# Patient Record
Sex: Female | Born: 1935 | Race: White | Hispanic: No | Marital: Married | Smoking: Former smoker
Health system: Southern US, Community
[De-identification: ages and names within clinical notes are randomized; demographics above are authoritative.]

## PROBLEM LIST (undated history)

## (undated) DIAGNOSIS — M169 Osteoarthritis of hip, unspecified: Secondary | ICD-10-CM

## (undated) DIAGNOSIS — J45909 Unspecified asthma, uncomplicated: Secondary | ICD-10-CM

## (undated) DIAGNOSIS — G4700 Insomnia, unspecified: Secondary | ICD-10-CM

## (undated) DIAGNOSIS — E119 Type 2 diabetes mellitus without complications: Secondary | ICD-10-CM

## (undated) DIAGNOSIS — I5032 Chronic diastolic (congestive) heart failure: Secondary | ICD-10-CM

## (undated) DIAGNOSIS — M75101 Unspecified rotator cuff tear or rupture of right shoulder, not specified as traumatic: Secondary | ICD-10-CM

## (undated) DIAGNOSIS — R609 Edema, unspecified: Secondary | ICD-10-CM

## (undated) DIAGNOSIS — R000 Tachycardia, unspecified: Secondary | ICD-10-CM

## (undated) DIAGNOSIS — E7800 Pure hypercholesterolemia, unspecified: Secondary | ICD-10-CM

## (undated) DIAGNOSIS — W19XXXA Unspecified fall, initial encounter: Secondary | ICD-10-CM

## (undated) DIAGNOSIS — J189 Pneumonia, unspecified organism: Secondary | ICD-10-CM

## (undated) DIAGNOSIS — K30 Functional dyspepsia: Secondary | ICD-10-CM

## (undated) DIAGNOSIS — R2681 Unsteadiness on feet: Secondary | ICD-10-CM

## (undated) DIAGNOSIS — K5790 Diverticulosis of intestine, part unspecified, without perforation or abscess without bleeding: Secondary | ICD-10-CM

## (undated) DIAGNOSIS — K56699 Other intestinal obstruction unspecified as to partial versus complete obstruction: Secondary | ICD-10-CM

## (undated) DIAGNOSIS — M8580 Other specified disorders of bone density and structure, unspecified site: Secondary | ICD-10-CM

## (undated) DIAGNOSIS — E039 Hypothyroidism, unspecified: Secondary | ICD-10-CM

## (undated) DIAGNOSIS — K635 Polyp of colon: Secondary | ICD-10-CM

## (undated) DIAGNOSIS — I10 Essential (primary) hypertension: Secondary | ICD-10-CM

## (undated) DIAGNOSIS — J302 Other seasonal allergic rhinitis: Secondary | ICD-10-CM

## (undated) HISTORY — DX: Functional dyspepsia: K30

## (undated) HISTORY — DX: Pure hypercholesterolemia, unspecified: E78.00

## (undated) HISTORY — DX: Unspecified rotator cuff tear or rupture of right shoulder, not specified as traumatic: M75.101

## (undated) HISTORY — DX: Diverticulosis of intestine, part unspecified, without perforation or abscess without bleeding: K57.90

## (undated) HISTORY — DX: Essential (primary) hypertension: I10

## (undated) HISTORY — PX: CATARACT EXTRACTION W/ INTRAOCULAR LENS  IMPLANT, BILATERAL: SHX1307

## (undated) HISTORY — DX: Osteoarthritis of hip, unspecified: M16.9

## (undated) HISTORY — DX: Other specified disorders of bone density and structure, unspecified site: M85.80

## (undated) HISTORY — PX: ORIF WRIST FRACTURE: SHX2133

## (undated) HISTORY — DX: Hypothyroidism, unspecified: E03.9

## (undated) HISTORY — DX: Unsteadiness on feet: R26.81

## (undated) HISTORY — PX: FRACTURE SURGERY: SHX138

## (undated) HISTORY — DX: Chronic diastolic (congestive) heart failure: I50.32

## (undated) HISTORY — PX: DILATION AND CURETTAGE OF UTERUS: SHX78

## (undated) HISTORY — DX: Other intestinal obstruction unspecified as to partial versus complete obstruction: K56.699

## (undated) HISTORY — PX: JOINT REPLACEMENT: SHX530

## (undated) HISTORY — DX: Tachycardia, unspecified: R00.0

## (undated) HISTORY — DX: Insomnia, unspecified: G47.00

## (undated) HISTORY — DX: Edema, unspecified: R60.9

## (undated) HISTORY — DX: Polyp of colon: K63.5

---
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
## 1997-01-11 HISTORY — PX: REDUCTION MAMMAPLASTY: SUR839

## 1997-07-03 ENCOUNTER — Other Ambulatory Visit: Admission: RE | Admit: 1997-07-03 | Discharge: 1997-07-03 | Payer: Self-pay | Admitting: Gynecology

## 1998-08-13 ENCOUNTER — Other Ambulatory Visit: Admission: RE | Admit: 1998-08-13 | Discharge: 1998-08-13 | Payer: Self-pay | Admitting: Gynecology

## 1998-11-11 ENCOUNTER — Encounter: Admission: RE | Admit: 1998-11-11 | Discharge: 1998-11-11 | Payer: Self-pay | Admitting: Gynecology

## 1998-11-11 ENCOUNTER — Encounter: Payer: Self-pay | Admitting: Gynecology

## 1999-10-28 ENCOUNTER — Other Ambulatory Visit: Admission: RE | Admit: 1999-10-28 | Discharge: 1999-10-28 | Payer: Self-pay | Admitting: Gynecology

## 1999-11-10 ENCOUNTER — Encounter (INDEPENDENT_AMBULATORY_CARE_PROVIDER_SITE_OTHER): Payer: Self-pay | Admitting: Specialist

## 1999-11-10 ENCOUNTER — Ambulatory Visit (HOSPITAL_COMMUNITY): Admission: RE | Admit: 1999-11-10 | Discharge: 1999-11-10 | Payer: Self-pay | Admitting: Gastroenterology

## 1999-11-16 ENCOUNTER — Encounter: Payer: Self-pay | Admitting: Gynecology

## 1999-11-16 ENCOUNTER — Encounter: Admission: RE | Admit: 1999-11-16 | Discharge: 1999-11-16 | Payer: Self-pay | Admitting: Gynecology

## 2000-11-15 ENCOUNTER — Encounter: Payer: Self-pay | Admitting: Internal Medicine

## 2000-11-15 ENCOUNTER — Encounter: Admission: RE | Admit: 2000-11-15 | Discharge: 2000-11-15 | Payer: Self-pay | Admitting: Internal Medicine

## 2000-11-21 ENCOUNTER — Other Ambulatory Visit: Admission: RE | Admit: 2000-11-21 | Discharge: 2000-11-21 | Payer: Self-pay | Admitting: Gynecology

## 2001-11-23 ENCOUNTER — Encounter: Payer: Self-pay | Admitting: Gynecology

## 2001-11-23 ENCOUNTER — Encounter: Admission: RE | Admit: 2001-11-23 | Discharge: 2001-11-23 | Payer: Self-pay | Admitting: Gynecology

## 2002-02-22 ENCOUNTER — Other Ambulatory Visit: Admission: RE | Admit: 2002-02-22 | Discharge: 2002-02-22 | Payer: Self-pay | Admitting: Gynecology

## 2002-05-15 ENCOUNTER — Encounter: Admission: RE | Admit: 2002-05-15 | Discharge: 2002-06-28 | Payer: Self-pay | Admitting: Internal Medicine

## 2002-12-12 ENCOUNTER — Encounter: Admission: RE | Admit: 2002-12-12 | Discharge: 2002-12-12 | Payer: Self-pay | Admitting: Gynecology

## 2003-12-16 ENCOUNTER — Encounter: Admission: RE | Admit: 2003-12-16 | Discharge: 2003-12-16 | Payer: Self-pay | Admitting: Gynecology

## 2005-01-12 ENCOUNTER — Encounter: Admission: RE | Admit: 2005-01-12 | Discharge: 2005-01-12 | Payer: Self-pay | Admitting: Internal Medicine

## 2005-03-30 ENCOUNTER — Other Ambulatory Visit: Admission: RE | Admit: 2005-03-30 | Discharge: 2005-03-30 | Payer: Self-pay | Admitting: Gynecology

## 2005-10-29 ENCOUNTER — Encounter: Admission: RE | Admit: 2005-10-29 | Discharge: 2005-10-29 | Payer: Self-pay | Admitting: Orthopedic Surgery

## 2006-01-13 ENCOUNTER — Encounter: Admission: RE | Admit: 2006-01-13 | Discharge: 2006-01-13 | Payer: Self-pay | Admitting: Internal Medicine

## 2006-05-11 ENCOUNTER — Encounter: Admission: RE | Admit: 2006-05-11 | Discharge: 2006-05-11 | Payer: Self-pay | Admitting: Orthopedic Surgery

## 2006-09-06 ENCOUNTER — Encounter: Admission: RE | Admit: 2006-09-06 | Discharge: 2006-09-06 | Payer: Self-pay | Admitting: Orthopedic Surgery

## 2007-01-12 DIAGNOSIS — K56699 Other intestinal obstruction unspecified as to partial versus complete obstruction: Secondary | ICD-10-CM

## 2007-01-12 HISTORY — DX: Other intestinal obstruction unspecified as to partial versus complete obstruction: K56.699

## 2007-01-12 HISTORY — PX: COLONOSCOPY: SHX174

## 2007-01-18 ENCOUNTER — Encounter: Admission: RE | Admit: 2007-01-18 | Discharge: 2007-01-18 | Payer: Self-pay | Admitting: Internal Medicine

## 2007-01-25 ENCOUNTER — Encounter: Admission: RE | Admit: 2007-01-25 | Discharge: 2007-01-25 | Payer: Self-pay | Admitting: Orthopedic Surgery

## 2007-05-08 ENCOUNTER — Encounter: Admission: RE | Admit: 2007-05-08 | Discharge: 2007-05-08 | Payer: Self-pay | Admitting: Orthopedic Surgery

## 2007-08-04 ENCOUNTER — Encounter: Admission: RE | Admit: 2007-08-04 | Discharge: 2007-08-04 | Payer: Self-pay | Admitting: Orthopedic Surgery

## 2007-10-17 ENCOUNTER — Encounter: Admission: RE | Admit: 2007-10-17 | Discharge: 2007-10-17 | Payer: Self-pay | Admitting: Orthopedic Surgery

## 2007-10-30 ENCOUNTER — Encounter: Admission: RE | Admit: 2007-10-30 | Discharge: 2007-10-30 | Payer: Self-pay | Admitting: Gastroenterology

## 2007-10-30 DIAGNOSIS — K5790 Diverticulosis of intestine, part unspecified, without perforation or abscess without bleeding: Secondary | ICD-10-CM

## 2007-10-30 HISTORY — DX: Diverticulosis of intestine, part unspecified, without perforation or abscess without bleeding: K57.90

## 2008-01-30 ENCOUNTER — Encounter: Admission: RE | Admit: 2008-01-30 | Discharge: 2008-01-30 | Payer: Self-pay | Admitting: Orthopedic Surgery

## 2008-03-04 ENCOUNTER — Inpatient Hospital Stay (HOSPITAL_COMMUNITY): Admission: RE | Admit: 2008-03-04 | Discharge: 2008-03-09 | Payer: Self-pay | Admitting: Orthopedic Surgery

## 2008-03-04 HISTORY — PX: TOTAL HIP ARTHROPLASTY: SHX124

## 2008-05-08 ENCOUNTER — Encounter: Admission: RE | Admit: 2008-05-08 | Discharge: 2008-05-08 | Payer: Self-pay | Admitting: Internal Medicine

## 2009-05-12 ENCOUNTER — Encounter: Admission: RE | Admit: 2009-05-12 | Discharge: 2009-05-12 | Payer: Self-pay | Admitting: Internal Medicine

## 2010-02-01 ENCOUNTER — Encounter: Payer: Self-pay | Admitting: Internal Medicine

## 2010-04-21 ENCOUNTER — Other Ambulatory Visit: Payer: Self-pay | Admitting: Internal Medicine

## 2010-04-21 DIAGNOSIS — Z1231 Encounter for screening mammogram for malignant neoplasm of breast: Secondary | ICD-10-CM

## 2010-04-28 LAB — PROTIME-INR
INR: 1 (ref 0.00–1.49)
INR: 1.1 (ref 0.00–1.49)
INR: 1.3 (ref 0.00–1.49)
INR: 2 — ABNORMAL HIGH (ref 0.00–1.49)
INR: 2.1 — ABNORMAL HIGH (ref 0.00–1.49)
INR: 2.4 — ABNORMAL HIGH (ref 0.00–1.49)
Prothrombin Time: 13.8 seconds (ref 11.6–15.2)
Prothrombin Time: 14.5 seconds (ref 11.6–15.2)
Prothrombin Time: 16.6 seconds — ABNORMAL HIGH (ref 11.6–15.2)
Prothrombin Time: 24 seconds — ABNORMAL HIGH (ref 11.6–15.2)
Prothrombin Time: 25.2 seconds — ABNORMAL HIGH (ref 11.6–15.2)
Prothrombin Time: 28.2 seconds — ABNORMAL HIGH (ref 11.6–15.2)

## 2010-04-28 LAB — BASIC METABOLIC PANEL
BUN: 20 mg/dL (ref 6–23)
BUN: 21 mg/dL (ref 6–23)
BUN: 24 mg/dL — ABNORMAL HIGH (ref 6–23)
BUN: 25 mg/dL — ABNORMAL HIGH (ref 6–23)
BUN: 26 mg/dL — ABNORMAL HIGH (ref 6–23)
CO2: 23 mEq/L (ref 19–32)
CO2: 25 mEq/L (ref 19–32)
CO2: 26 mEq/L (ref 19–32)
CO2: 26 mEq/L (ref 19–32)
CO2: 29 mEq/L (ref 19–32)
Calcium: 7.9 mg/dL — ABNORMAL LOW (ref 8.4–10.5)
Calcium: 8 mg/dL — ABNORMAL LOW (ref 8.4–10.5)
Calcium: 8.3 mg/dL — ABNORMAL LOW (ref 8.4–10.5)
Calcium: 8.3 mg/dL — ABNORMAL LOW (ref 8.4–10.5)
Calcium: 8.3 mg/dL — ABNORMAL LOW (ref 8.4–10.5)
Chloride: 102 mEq/L (ref 96–112)
Chloride: 104 mEq/L (ref 96–112)
Chloride: 106 mEq/L (ref 96–112)
Chloride: 107 mEq/L (ref 96–112)
Chloride: 99 mEq/L (ref 96–112)
Creatinine, Ser: 0.91 mg/dL (ref 0.4–1.2)
Creatinine, Ser: 0.96 mg/dL (ref 0.4–1.2)
Creatinine, Ser: 1 mg/dL (ref 0.4–1.2)
Creatinine, Ser: 1.08 mg/dL (ref 0.4–1.2)
Creatinine, Ser: 1.13 mg/dL (ref 0.4–1.2)
GFR calc Af Amer: 56 mL/min — ABNORMAL LOW (ref >60)
GFR calc Af Amer: 59 mL/min — ABNORMAL LOW (ref >60)
GFR calc Af Amer: >60 mL/min (ref >60)
GFR calc Af Amer: >60 mL/min (ref >60)
GFR calc Af Amer: >60 mL/min (ref >60)
GFR calc non Af Amer: 46 mL/min — ABNORMAL LOW (ref >60)
GFR calc non Af Amer: 49 mL/min — ABNORMAL LOW (ref >60)
GFR calc non Af Amer: 53 mL/min — ABNORMAL LOW (ref >60)
GFR calc non Af Amer: 56 mL/min — ABNORMAL LOW (ref >60)
GFR calc non Af Amer: 60 mL/min — ABNORMAL LOW (ref >60)
Glucose, Bld: 119 mg/dL — ABNORMAL HIGH (ref 70–99)
Glucose, Bld: 120 mg/dL — ABNORMAL HIGH (ref 70–99)
Glucose, Bld: 132 mg/dL — ABNORMAL HIGH (ref 70–99)
Glucose, Bld: 157 mg/dL — ABNORMAL HIGH (ref 70–99)
Glucose, Bld: 98 mg/dL (ref 70–99)
Potassium: 3 mEq/L — ABNORMAL LOW (ref 3.5–5.1)
Potassium: 3.7 mEq/L (ref 3.5–5.1)
Potassium: 3.8 mEq/L (ref 3.5–5.1)
Potassium: 4.2 mEq/L (ref 3.5–5.1)
Potassium: 4.3 mEq/L (ref 3.5–5.1)
Sodium: 131 mEq/L — ABNORMAL LOW (ref 135–145)
Sodium: 134 mEq/L — ABNORMAL LOW (ref 135–145)
Sodium: 134 mEq/L — ABNORMAL LOW (ref 135–145)
Sodium: 139 mEq/L (ref 135–145)
Sodium: 140 mEq/L (ref 135–145)

## 2010-04-28 LAB — ABO/RH: ABO/RH(D): O POS

## 2010-04-28 LAB — URINE CULTURE
Colony Count: NO GROWTH
Culture: NO GROWTH
Special Requests: NEGATIVE

## 2010-04-28 LAB — CBC
HCT: 26.4 % — ABNORMAL LOW (ref 36.0–46.0)
HCT: 29 % — ABNORMAL LOW (ref 36.0–46.0)
HCT: 29.8 % — ABNORMAL LOW (ref 36.0–46.0)
HCT: 31.4 % — ABNORMAL LOW (ref 36.0–46.0)
HCT: 41 % (ref 36.0–46.0)
Hemoglobin: 10.5 g/dL — ABNORMAL LOW (ref 12.0–15.0)
Hemoglobin: 13.5 g/dL (ref 12.0–15.0)
Hemoglobin: 8.9 g/dL — ABNORMAL LOW (ref 12.0–15.0)
Hemoglobin: 9.9 g/dL — ABNORMAL LOW (ref 12.0–15.0)
Hemoglobin: 9.9 g/dL — ABNORMAL LOW (ref 12.0–15.0)
MCHC: 33 g/dL (ref 30.0–36.0)
MCHC: 33.3 g/dL (ref 30.0–36.0)
MCHC: 33.4 g/dL (ref 30.0–36.0)
MCHC: 33.6 g/dL (ref 30.0–36.0)
MCHC: 34.2 g/dL (ref 30.0–36.0)
MCV: 92.8 fL (ref 78.0–100.0)
MCV: 92.8 fL (ref 78.0–100.0)
MCV: 93.1 fL (ref 78.0–100.0)
MCV: 93.8 fL (ref 78.0–100.0)
MCV: 93.9 fL (ref 78.0–100.0)
Platelets: 156 10*3/uL (ref 150–400)
Platelets: 161 10*3/uL (ref 150–400)
Platelets: 165 10*3/uL (ref 150–400)
Platelets: 175 10*3/uL (ref 150–400)
Platelets: 219 10*3/uL (ref 150–400)
RBC: 2.84 MIL/uL — ABNORMAL LOW (ref 3.87–5.11)
RBC: 3.12 MIL/uL — ABNORMAL LOW (ref 3.87–5.11)
RBC: 3.17 MIL/uL — ABNORMAL LOW (ref 3.87–5.11)
RBC: 3.38 MIL/uL — ABNORMAL LOW (ref 3.87–5.11)
RBC: 4.37 MIL/uL (ref 3.87–5.11)
RDW: 13.7 % (ref 11.5–15.5)
RDW: 13.7 % (ref 11.5–15.5)
RDW: 13.9 % (ref 11.5–15.5)
RDW: 14 % (ref 11.5–15.5)
RDW: 14.4 % (ref 11.5–15.5)
WBC: 11.1 10*3/uL — ABNORMAL HIGH (ref 4.0–10.5)
WBC: 11.6 10*3/uL — ABNORMAL HIGH (ref 4.0–10.5)
WBC: 6.6 10*3/uL (ref 4.0–10.5)
WBC: 8.6 10*3/uL (ref 4.0–10.5)
WBC: 9.4 10*3/uL (ref 4.0–10.5)

## 2010-04-28 LAB — URINE MICROSCOPIC-ADD ON

## 2010-04-28 LAB — URINALYSIS, ROUTINE W REFLEX MICROSCOPIC
Bilirubin Urine: NEGATIVE
Bilirubin Urine: NEGATIVE
Glucose, UA: NEGATIVE mg/dL
Glucose, UA: NEGATIVE mg/dL
Hgb urine dipstick: NEGATIVE
Hgb urine dipstick: NEGATIVE
Ketones, ur: NEGATIVE mg/dL
Ketones, ur: NEGATIVE mg/dL
Nitrite: NEGATIVE
Nitrite: POSITIVE — AB
Protein, ur: NEGATIVE mg/dL
Protein, ur: NEGATIVE mg/dL
Specific Gravity, Urine: 1.021 (ref 1.005–1.030)
Specific Gravity, Urine: 1.024 (ref 1.005–1.030)
Urobilinogen, UA: 0.2 mg/dL (ref 0.0–1.0)
Urobilinogen, UA: 1 mg/dL (ref 0.0–1.0)
pH: 5.5 (ref 5.0–8.0)
pH: 6 (ref 5.0–8.0)

## 2010-04-28 LAB — COMPREHENSIVE METABOLIC PANEL
ALT: 17 U/L (ref 0–35)
AST: 25 U/L (ref 0–37)
Albumin: 4 g/dL (ref 3.5–5.2)
Alkaline Phosphatase: 68 U/L (ref 39–117)
BUN: 31 mg/dL — ABNORMAL HIGH (ref 6–23)
CO2: 27 mEq/L (ref 19–32)
Calcium: 9.8 mg/dL (ref 8.4–10.5)
Chloride: 111 mEq/L (ref 96–112)
Creatinine, Ser: 1.27 mg/dL — ABNORMAL HIGH (ref 0.4–1.2)
GFR calc Af Amer: 49 mL/min — ABNORMAL LOW (ref >60)
GFR calc non Af Amer: 41 mL/min — ABNORMAL LOW (ref >60)
Glucose, Bld: 136 mg/dL — ABNORMAL HIGH (ref 70–99)
Potassium: 4.7 mEq/L (ref 3.5–5.1)
Sodium: 147 mEq/L — ABNORMAL HIGH (ref 135–145)
Total Bilirubin: 0.9 mg/dL (ref 0.3–1.2)
Total Protein: 6.7 g/dL (ref 6.0–8.3)

## 2010-04-28 LAB — TYPE AND SCREEN
ABO/RH(D): O POS
Antibody Screen: NEGATIVE

## 2010-04-28 LAB — APTT: aPTT: 29 seconds (ref 24–37)

## 2010-05-14 ENCOUNTER — Ambulatory Visit: Payer: Self-pay

## 2010-05-20 ENCOUNTER — Ambulatory Visit: Payer: Self-pay

## 2010-05-25 ENCOUNTER — Ambulatory Visit
Admission: RE | Admit: 2010-05-25 | Discharge: 2010-05-25 | Disposition: A | Discharge status: Home / Self Care | Payer: Medicare Other | Source: Ambulatory Visit | Attending: Internal Medicine | Admitting: Internal Medicine

## 2010-05-25 DIAGNOSIS — Z1231 Encounter for screening mammogram for malignant neoplasm of breast: Secondary | ICD-10-CM

## 2010-05-26 NOTE — Op Note (Signed)
[name], Salinas             ACCOUNT NO.:  [id number]   MEDICAL RECORD NO.:  [id number]          PATIENT TYPE:  INP   LOCATION:  [place]                         FACILITY:  [facility]   PHYSICIAN:  Gaynelle Arabian, M.D.    DATE OF BIRTH:  September 24, 1926   DATE OF PROCEDURE:  03/04/2008  DATE OF DISCHARGE:                               OPERATIVE REPORT   PREOPERATIVE DIAGNOSIS:  Osteoarthritis, right hip.   POSTOPERATIVE DIAGNOSIS:  Osteoarthritis, right hip.   PROCEDURE:  Right total hip arthroplasty.   SURGEON:  Gaynelle Arabian, M.D.   ASSISTANT:  Arlee Muslim PA-C.   ANESTHESIA:  General.   ESTIMATED BLOOD LOSS:  300.   DRAIN:  Hemovac x1.   COMPLICATIONS:  None.   CONDITION:  Stable to recovery.   BRIEF CLINICAL NOTE:  Kelsey Salinas is a 77 year old female with end-  stage arthritis of the right hip with progressively worsening pain and  dysfunction.  She has failed nonoperative management and presents now  for right total hip arthroplasty.   PROCEDURE IN DETAIL:  After the successful administration of general  anesthetic, the patient is placed in the left lateral decubitus position  with the right side up and held with the hip positioner.  The right  lower extremity is isolated from her perineum with plastic drapes and  prepped and draped in the usual sterile fashion.  A standard  posterolateral incision is made with a 10 blade through subcutaneous  tissue to the level of fascia lata which is incised in line with the  skin incision.  Sciatic nerve was palpated and protected and the short  external rotator is isolated off the femur.  Capsulectomy is performed  and the hip is dislocated.  The center of the femoral head is marked and  a trial prosthesis is placed, such that the center of the trial head  corresponds to the center of native femoral head.  The osteotomy line is  marked on the femoral neck and osteotomy made with an oscillating saw.  The femoral head is removed and  the femur is retracted anteriorly to  gain acetabular exposure.   Acetabular retractors are placed in the labrum and osteophytes removed.  Acetabular reaming starts at 47-mm, coursing increments of 2-mm up to 55-  mm and then a 56-mm pinnacle acetabular shell is placed in anatomic  position with excellent purchase.  No additional dome screws are needed.  The trial 32-mm neutral +4 liner is placed.   The femur is prepared with the canal finder and irrigation.  Axial  reaming is performed up to 13.5-mm, proximal reaming to an 59F and the  sleeve machined to a large.  The 59F large trial sleeve is placed with  an 18 x 13 stem and a 36+8 neck, matching native anteversion.  The 36+0  head is placed.  This reduces too easily so we went to a 36+6 which had  more appropriate soft tissue tension.  There is outstanding stability  with full extension, full external rotation, 70 degrees flexion, 40  degrees adduction and 90 degrees internal rotation and 90 degrees of  flexion and 70 degrees of internal rotation.  By placing the right leg  on top of the left it felt as though the leg lengths are now equal.  The  hip is then dislocated and trials removed.  The permanent 30F large  sleeve is then placed with the 18 x 13 stem and a 36+8 neck, matching  native anteversion.  A 36+6 head is placed and the hip is reduced with  same stability parameters.  The wound is copiously irrigated with saline  solution and the short rotators reattached to the femur through drill  holes.  Fascia lata is closed over a Hemovac drain with interrupted #1  Vicryl, subcu closed with #1-0 and #2-0 Vicryl and subcuticular running  4-0 Monocryl.  The incisions cleaned and dried and Steri-Strips and  bulky sterile dressing applied.  Drains hooked to suction and knee is  placed into a knee immobilizer, awakened, transported to recovery in  stable condition.      Gaynelle Arabian, M.D.  Electronically Signed     FA/MEDQ  D:   03/04/2008  T:  03/04/2008  Job:  [id number]

## 2010-05-26 NOTE — H&P (Signed)
Kelsey Salinas, Kelsey Salinas             ACCOUNT NO.:  [id number]   MEDICAL RECORD NO.:  [id number]          PATIENT TYPE:  INP   LOCATION:  NA                           FACILITY:  [facility]   PHYSICIAN:  Gaynelle Arabian, M.D.    DATE OF BIRTH:  06/16/1926   DATE OF ADMISSION:  03/02/2008  DATE OF DISCHARGE:                              HISTORY & PHYSICAL   CHIEF COMPLAINT:  Right hip pain.   HISTORY OF PRESENT ILLNESS:  The patient is 76 year old female who has  been seen by Dr. Wynelle Link in second opinion for ongoing hip pain.  It has  been ongoing for over 4+ years now, no specific injury, decreasing range  of motion with increasing pain.  She has been seen in the office, found  to have end-stage arthritis with bone-on-bone deformity with also  deformity of the femoral head due to arthritis subchondral cystic  changes are also noted.  It is at a point now she is end-stage and felt  to be a good candidate.  She has been seen preoperatively by Dr. Maxwell Caul  and felt she should be average cardiovascular risk for a patient of her  age.  No contraindications to planned surgery.   ALLERGIES:  NO KNOWN DRUG ALLERGIES.   CURRENT MEDICATIONS:  Actonel, amlodipine, Diovan, simvastatin,  metoprolol, a [place] brand stomach acid reducer and also vitamin D.   PAST MEDICAL HISTORY:  1. Hypertension.  2. Hypercholesterolemia.  3. Osteopenia.   PAST SURGICAL HISTORY:  Breast reduction.   FAMILY HISTORY:  Father with heart disease.  Mother with heart disease.  Brother with heart disease.   SOCIAL HISTORY:  Married, past smoker.  No alcohol.  Three children.  She does have a caregiver lined up after surgery.   REVIEW OF SYSTEMS:  GENERAL:  No fevers, chills or night sweats.  NEUROLOGICAL:  No seizures, syncope or paralysis.  RESPIRATORY:  Little  bit of shortness on exertion, sometimes, no shortness of breath at rest.  No productive cough or hemoptysis.  CARDIOVASCULAR:  Chest pain, angina,  orthopnea.  GI:  No nausea, vomiting, diarrhea or constipation.  GU:  No  dysuria or hematuria or discharge.  MUSCULOSKELETAL:  Hip pain.   PHYSICAL EXAMINATION:  VITAL SIGNS:  Pulse 60, respirations 14, blood  pressure 136/72.  GENERAL:  A 77 year old white female well-nourished, well-developed, no  acute distress.  She is alert, oriented and cooperative.  HEENT:  Normocephalic, atraumatic.  Pupils are round and reactive.  EOMs  intact.  Never wear glasses.  NECK:  Supple.  CHEST:  Clear.  HEART:  Regular rate and rhythm.  No murmur, S1 ST noted.  ABDOMEN:  Soft, nontender.  Bowel sounds present.  RECTAL/BREASTS/GENITALIA:  Not done, not pertinent to present illness.  EXTREMITIES:  Right hip, flexion 90, 0 internal rotation, about 10 deg  external rotation, about 20 degrees abduction.   IMPRESSION:  Osteoarthritis right hip.   PLAN:  The patient admitted to [facility] to undergo a right  total hip replacement arthroplasty.  Surgery will be performed by Dr.  Gaynelle Arabian.  Alexzandrew L. Perkins, P.A.C.      Gaynelle Arabian, M.D.  Electronically Signed    ALP/MEDQ  D:  03/03/2008  T:  03/04/2008  Job:  [id number]   cc:   Nehemiah Settle, M.D.  Fax: [phone]   Gaynelle Arabian, M.D.  Fax: [phone]

## 2010-05-29 NOTE — Discharge Summary (Signed)
Kelsey Salinas, Kelsey Salinas             ACCOUNT NO.:  [id number]   MEDICAL RECORD NO.:  [id number]          PATIENT TYPE:  INP   LOCATION:  [place]                         FACILITY:  [facility]   PHYSICIAN:  Gaynelle Arabian, M.D.    DATE OF BIRTH:  04-13-1926   DATE OF ADMISSION:  03/04/2008  DATE OF DISCHARGE:  03/09/2008                               DISCHARGE SUMMARY   ADMITTING DIAGNOSES:  1. Osteoarthritis right hip.  2. Hypertension.  3. Hypercholesterolemia.  4. Osteopenia.   DISCHARGE DIAGNOSES:  1. Osteoarthritis right hip, status post right total hip replacement      arthroplasty.  2. Mild postoperative acute blood loss anemia.  3. Postoperative hyponatremia, improved.  4. Postoperative hypokalemia, improved.  5. Postoperative sedation, resolved.  6. Postoperative hypoxia, resolved.  7. Hypertension.  8. Hypercholesterolemia.  9. Osteopenia.   PROCEDURE:  March 04, 2008 - right total hip arthroplasty.  Surgeon -  Dr. Wynelle Link.  Assistant - Arlee Muslim PA-C.   CONSULTATIONS:  None.   BRIEF HISTORY:  Kelsey Salinas is a 77 year old female with end-stage  arthritis of the right hip, progressive worsening pain and dysfunction  who failed operative management and now presents for a total hip  arthroplasty.   LABORATORY DATA:  CBC on admission - hemoglobin 13.5, hematocrit 41.0,  white cell count 6.6, platelets 219.  Postoperative hemoglobin 10.5,  then dropped down to 9.9.  Last noted hemoglobin and hematocrit were 8.9  and 26.4.  PT/PTT preoperatively 13.8 and 29 respectively.  INR 1.0.  Serial pro times followed per Coumadin protocol.  Last noted PT/INR 25.2  and 2.1.  Chem panel on admission showed some elevated sodium of 147,  glucose elevated at 136, elevated BUN and creatinine at 31 and 1.2.  The  remaining Chem panel within normal limits.  Serial BMETs were followed.  BMET came down normal to 21, creatinine came down immediately to 1.0,  and last at  0.9.  Sodium came  down to 134, got as low as 131, back up  to 140.  Potassium dropped from 4.7 to 3.0, back up to 4.3.  Preoperative UA with positive nitrite, moderate leukocyte esterase, 3-6  white cells, many bacteria.  Follow-up UA only showed small leukocyte  esterase, few WBCs, 7-10 white cells; otherwise negative.  Urine culture  was negative.  Right hip film on February 28, 2008 revealed advanced  degenerative changes of the right hip; question underlying AVN.  Mild  degenerative changes of the left hip, SI joint and lumbosacral junction.  No acute findings.  Pelvis and hip films on March 04, 2008 - status  post right total hip replacement appears to be in satisfactory position.   EKG, July 27, 2007, normal sinus rhythm, no changes.   HOSPITAL COURSE:  The patient was admitted to [facility],  taken to the OR and underwent the above-stated procedure without  complication.  The patient tolerated the procedure well and was later  taken to the recovery room and orthopedic floor.  Started on PCA and  p.o. analgesic pain control following surgery.  Had a long night.  The  family was in the room.  Had difficulty with pain.  BUN and creatinine  started out elevated preoperatively.  It may have just been due to some  dehydration preoperatively but started BUN preoperative 31 down to 25 on  day one and preoperative creatinine 1.2 down to 1.0, so it is already  improved.  She had low urinary output though, so we supplemented the  preoperative azotemia with fluids, low output also with fluids.  Discontinued the PCA.  Blood pressure was stable.  Vitals were stable.  Hemovac drain placed during the surgery was pulled.  Blood pressure  looked good but on the lower side, so we held her blood pressure  medications with certain parameters, discontinued the knee immobilizer,  started getting up with therapy.  Unfortunately, on the evening of day  one, she started getting lethargic from medications.   Pulse oximetry  started dropping a little bit.  We placed her back on O2, saturations  came back up in the 90s, it got low in the mid 80s, and when she would  get lethargic, she had repeating drop but was able to cough and deep  breathe, stimulating back up in the mid 90s.  She had to go on to an O2  mask and stayed on the oxygen.  She was doing a little bit better by day  two.  We reduced the narcotics, and let the medications work themselves  out.  Hemoglobin was stable.  Sodium was down a little bit, down to 134  though.  Her output had improved greatly, started getting up more.  We  repeated the UA because of the preoperative urinalysis.  By day three,  the urine culture was negative.  Sodium was down, potassium was down  because of all the supplemental fluids that we had used, so once her  output was increased, we decreased fluids and gave her a little mild  diuretic to help with the dilutional component and also put her on some  K-Dur.  Chest x-ray was checked.  The chest x-ray done on March 07, 2008 showed mild basilar atelectasis but no evidence of pneumonia or  effusion.  Encouraged incentive spirometer and pulmonary toilet.  By day  four, she still needed a little O2 through the night, since her O2s,  were dropping.  If they continued to drop during the day, we checked the  scan, but those actually did improve.  Hemoglobin was down a little bit  to 8.9, but she was asymptomatic with this.  Incisional only had some  serous drainage from the Hemovac site; otherwise the incision was  healing well.  The Lasix helped, and her sodium, concentrated back up to  140 and potassium was back up with the K-Dur to 4.3.  We discontinued  the nasal cannula O2, and luckily she stayed up and was doing well by  the next day, March 09, 2008.  The patient was tolerating her  medications and discharged home.   DISCHARGE/PLAN:  1. The patient discharged home on March 09, 2008.  2.  Discharge diagnoses - please see above.  3. Discharge medications - Darvocet, Robaxin, Coumadin.  4. Diet - low-sodium, heart-healthy, low-cholesterol diet.   ACTIVITY:  Partial weightbearing, 25-50% right leg, hip precautions,  total hip protocol.   FOLLOWUP:  Follow up in 2 weeks.   DISPOSITION:  Home.   CONDITION UPON DISCHARGE:  Improving.      Alexzandrew L. Perkins, P.A.C.      Pilar Plate Aluisio,  M.D.  Electronically Signed    ALP/MEDQ  D:  04/04/2008  T:  04/04/2008  Job:  [id number]   cc:   Nehemiah Settle, M.D.  Fax: [phone]

## 2010-10-20 ENCOUNTER — Other Ambulatory Visit: Payer: Self-pay | Admitting: Dermatology

## 2011-01-20 DIAGNOSIS — L03039 Cellulitis of unspecified toe: Secondary | ICD-10-CM | POA: Diagnosis not present

## 2011-01-20 DIAGNOSIS — L600 Ingrowing nail: Secondary | ICD-10-CM | POA: Diagnosis not present

## 2011-01-26 DIAGNOSIS — M1710 Unilateral primary osteoarthritis, unspecified knee: Secondary | ICD-10-CM | POA: Diagnosis not present

## 2011-01-26 DIAGNOSIS — IMO0002 Reserved for concepts with insufficient information to code with codable children: Secondary | ICD-10-CM | POA: Diagnosis not present

## 2011-01-28 DIAGNOSIS — F4322 Adjustment disorder with anxiety: Secondary | ICD-10-CM | POA: Diagnosis not present

## 2011-02-01 DIAGNOSIS — IMO0002 Reserved for concepts with insufficient information to code with codable children: Secondary | ICD-10-CM | POA: Diagnosis not present

## 2011-02-01 DIAGNOSIS — M1710 Unilateral primary osteoarthritis, unspecified knee: Secondary | ICD-10-CM | POA: Diagnosis not present

## 2011-02-10 DIAGNOSIS — M1710 Unilateral primary osteoarthritis, unspecified knee: Secondary | ICD-10-CM | POA: Diagnosis not present

## 2011-02-10 DIAGNOSIS — IMO0002 Reserved for concepts with insufficient information to code with codable children: Secondary | ICD-10-CM | POA: Diagnosis not present

## 2011-02-17 DIAGNOSIS — F4322 Adjustment disorder with anxiety: Secondary | ICD-10-CM | POA: Diagnosis not present

## 2011-03-18 DIAGNOSIS — L821 Other seborrheic keratosis: Secondary | ICD-10-CM | POA: Diagnosis not present

## 2011-03-18 DIAGNOSIS — L723 Sebaceous cyst: Secondary | ICD-10-CM | POA: Diagnosis not present

## 2011-03-25 DIAGNOSIS — M1710 Unilateral primary osteoarthritis, unspecified knee: Secondary | ICD-10-CM | POA: Diagnosis not present

## 2011-03-25 DIAGNOSIS — IMO0002 Reserved for concepts with insufficient information to code with codable children: Secondary | ICD-10-CM | POA: Diagnosis not present

## 2011-04-29 DIAGNOSIS — F4322 Adjustment disorder with anxiety: Secondary | ICD-10-CM | POA: Diagnosis not present

## 2011-05-18 DIAGNOSIS — F4322 Adjustment disorder with anxiety: Secondary | ICD-10-CM | POA: Diagnosis not present

## 2011-06-10 DIAGNOSIS — F4322 Adjustment disorder with anxiety: Secondary | ICD-10-CM | POA: Diagnosis not present

## 2011-06-18 DIAGNOSIS — L820 Inflamed seborrheic keratosis: Secondary | ICD-10-CM | POA: Diagnosis not present

## 2011-06-18 DIAGNOSIS — L570 Actinic keratosis: Secondary | ICD-10-CM | POA: Diagnosis not present

## 2011-06-18 DIAGNOSIS — L578 Other skin changes due to chronic exposure to nonionizing radiation: Secondary | ICD-10-CM | POA: Diagnosis not present

## 2011-06-29 DIAGNOSIS — M899 Disorder of bone, unspecified: Secondary | ICD-10-CM | POA: Diagnosis not present

## 2011-06-29 DIAGNOSIS — R05 Cough: Secondary | ICD-10-CM | POA: Diagnosis not present

## 2011-06-29 DIAGNOSIS — R059 Cough, unspecified: Secondary | ICD-10-CM | POA: Diagnosis not present

## 2011-06-29 DIAGNOSIS — E7800 Pure hypercholesterolemia, unspecified: Secondary | ICD-10-CM | POA: Diagnosis not present

## 2011-06-29 DIAGNOSIS — M949 Disorder of cartilage, unspecified: Secondary | ICD-10-CM | POA: Diagnosis not present

## 2011-06-29 DIAGNOSIS — F411 Generalized anxiety disorder: Secondary | ICD-10-CM | POA: Diagnosis not present

## 2011-06-29 DIAGNOSIS — I10 Essential (primary) hypertension: Secondary | ICD-10-CM | POA: Diagnosis not present

## 2011-07-13 ENCOUNTER — Ambulatory Visit: Payer: Medicare Other | Admitting: Sports Medicine

## 2011-07-22 DIAGNOSIS — M1710 Unilateral primary osteoarthritis, unspecified knee: Secondary | ICD-10-CM | POA: Diagnosis not present

## 2011-07-22 DIAGNOSIS — IMO0002 Reserved for concepts with insufficient information to code with codable children: Secondary | ICD-10-CM | POA: Diagnosis not present

## 2011-09-15 DIAGNOSIS — F4322 Adjustment disorder with anxiety: Secondary | ICD-10-CM | POA: Diagnosis not present

## 2011-09-23 ENCOUNTER — Other Ambulatory Visit: Payer: Self-pay | Admitting: Internal Medicine

## 2011-09-23 DIAGNOSIS — Z1231 Encounter for screening mammogram for malignant neoplasm of breast: Secondary | ICD-10-CM

## 2011-09-23 DIAGNOSIS — Z9889 Other specified postprocedural states: Secondary | ICD-10-CM

## 2011-10-13 ENCOUNTER — Ambulatory Visit
Admission: RE | Admit: 2011-10-13 | Discharge: 2011-10-13 | Disposition: A | Discharge status: Home / Self Care | Payer: Medicare Other | Source: Ambulatory Visit | Attending: Internal Medicine | Admitting: Internal Medicine

## 2011-10-13 DIAGNOSIS — Z1231 Encounter for screening mammogram for malignant neoplasm of breast: Secondary | ICD-10-CM

## 2011-10-13 DIAGNOSIS — Z9889 Other specified postprocedural states: Secondary | ICD-10-CM

## 2011-10-26 DIAGNOSIS — R0609 Other forms of dyspnea: Secondary | ICD-10-CM | POA: Diagnosis not present

## 2011-10-26 DIAGNOSIS — Z23 Encounter for immunization: Secondary | ICD-10-CM | POA: Diagnosis not present

## 2011-10-26 DIAGNOSIS — R0989 Other specified symptoms and signs involving the circulatory and respiratory systems: Secondary | ICD-10-CM | POA: Diagnosis not present

## 2011-10-26 DIAGNOSIS — I10 Essential (primary) hypertension: Secondary | ICD-10-CM | POA: Diagnosis not present

## 2011-11-03 DIAGNOSIS — E7800 Pure hypercholesterolemia, unspecified: Secondary | ICD-10-CM | POA: Diagnosis not present

## 2011-11-03 DIAGNOSIS — R0989 Other specified symptoms and signs involving the circulatory and respiratory systems: Secondary | ICD-10-CM | POA: Diagnosis not present

## 2011-11-03 DIAGNOSIS — R0609 Other forms of dyspnea: Secondary | ICD-10-CM | POA: Diagnosis not present

## 2011-11-03 DIAGNOSIS — I10 Essential (primary) hypertension: Secondary | ICD-10-CM | POA: Diagnosis not present

## 2011-11-15 DIAGNOSIS — L820 Inflamed seborrheic keratosis: Secondary | ICD-10-CM | POA: Diagnosis not present

## 2011-11-15 DIAGNOSIS — L578 Other skin changes due to chronic exposure to nonionizing radiation: Secondary | ICD-10-CM | POA: Diagnosis not present

## 2011-11-15 DIAGNOSIS — L723 Sebaceous cyst: Secondary | ICD-10-CM | POA: Diagnosis not present

## 2011-11-18 DIAGNOSIS — IMO0002 Reserved for concepts with insufficient information to code with codable children: Secondary | ICD-10-CM | POA: Diagnosis not present

## 2011-11-18 DIAGNOSIS — M1710 Unilateral primary osteoarthritis, unspecified knee: Secondary | ICD-10-CM | POA: Diagnosis not present

## 2011-12-08 DIAGNOSIS — R000 Tachycardia, unspecified: Secondary | ICD-10-CM | POA: Diagnosis not present

## 2011-12-08 DIAGNOSIS — R0609 Other forms of dyspnea: Secondary | ICD-10-CM | POA: Diagnosis not present

## 2011-12-08 DIAGNOSIS — I10 Essential (primary) hypertension: Secondary | ICD-10-CM | POA: Diagnosis not present

## 2011-12-08 DIAGNOSIS — I5032 Chronic diastolic (congestive) heart failure: Secondary | ICD-10-CM | POA: Diagnosis not present

## 2011-12-08 DIAGNOSIS — R0989 Other specified symptoms and signs involving the circulatory and respiratory systems: Secondary | ICD-10-CM | POA: Diagnosis not present

## 2011-12-15 DIAGNOSIS — R000 Tachycardia, unspecified: Secondary | ICD-10-CM | POA: Diagnosis not present

## 2011-12-15 DIAGNOSIS — R0989 Other specified symptoms and signs involving the circulatory and respiratory systems: Secondary | ICD-10-CM | POA: Diagnosis not present

## 2011-12-15 DIAGNOSIS — I5032 Chronic diastolic (congestive) heart failure: Secondary | ICD-10-CM | POA: Diagnosis not present

## 2011-12-15 DIAGNOSIS — R0609 Other forms of dyspnea: Secondary | ICD-10-CM | POA: Diagnosis not present

## 2011-12-15 DIAGNOSIS — I10 Essential (primary) hypertension: Secondary | ICD-10-CM | POA: Diagnosis not present

## 2011-12-21 DIAGNOSIS — Z1331 Encounter for screening for depression: Secondary | ICD-10-CM | POA: Diagnosis not present

## 2011-12-21 DIAGNOSIS — E7800 Pure hypercholesterolemia, unspecified: Secondary | ICD-10-CM | POA: Diagnosis not present

## 2011-12-21 DIAGNOSIS — Z0000 Encounter for general adult medical examination without abnormal findings: Secondary | ICD-10-CM | POA: Diagnosis not present

## 2011-12-21 DIAGNOSIS — I10 Essential (primary) hypertension: Secondary | ICD-10-CM | POA: Diagnosis not present

## 2011-12-21 DIAGNOSIS — N183 Chronic kidney disease, stage 3 unspecified: Secondary | ICD-10-CM | POA: Diagnosis not present

## 2011-12-21 DIAGNOSIS — M899 Disorder of bone, unspecified: Secondary | ICD-10-CM | POA: Diagnosis not present

## 2011-12-21 DIAGNOSIS — I5032 Chronic diastolic (congestive) heart failure: Secondary | ICD-10-CM | POA: Diagnosis not present

## 2012-01-19 DIAGNOSIS — F4322 Adjustment disorder with anxiety: Secondary | ICD-10-CM | POA: Diagnosis not present

## 2012-02-15 DIAGNOSIS — F4322 Adjustment disorder with anxiety: Secondary | ICD-10-CM | POA: Diagnosis not present

## 2012-02-21 DIAGNOSIS — I5032 Chronic diastolic (congestive) heart failure: Secondary | ICD-10-CM | POA: Diagnosis not present

## 2012-02-21 DIAGNOSIS — I10 Essential (primary) hypertension: Secondary | ICD-10-CM | POA: Diagnosis not present

## 2012-02-21 DIAGNOSIS — E7800 Pure hypercholesterolemia, unspecified: Secondary | ICD-10-CM | POA: Diagnosis not present

## 2012-02-22 DIAGNOSIS — H43819 Vitreous degeneration, unspecified eye: Secondary | ICD-10-CM | POA: Diagnosis not present

## 2012-02-22 DIAGNOSIS — H43399 Other vitreous opacities, unspecified eye: Secondary | ICD-10-CM | POA: Diagnosis not present

## 2012-02-22 DIAGNOSIS — H40019 Open angle with borderline findings, low risk, unspecified eye: Secondary | ICD-10-CM | POA: Diagnosis not present

## 2012-02-22 DIAGNOSIS — Z961 Presence of intraocular lens: Secondary | ICD-10-CM | POA: Diagnosis not present

## 2012-02-23 DIAGNOSIS — M1710 Unilateral primary osteoarthritis, unspecified knee: Secondary | ICD-10-CM | POA: Diagnosis not present

## 2012-02-23 DIAGNOSIS — IMO0002 Reserved for concepts with insufficient information to code with codable children: Secondary | ICD-10-CM | POA: Diagnosis not present

## 2012-04-05 DIAGNOSIS — F4322 Adjustment disorder with anxiety: Secondary | ICD-10-CM | POA: Diagnosis not present

## 2012-04-26 DIAGNOSIS — F4322 Adjustment disorder with anxiety: Secondary | ICD-10-CM | POA: Diagnosis not present

## 2012-05-10 DIAGNOSIS — F4322 Adjustment disorder with anxiety: Secondary | ICD-10-CM | POA: Diagnosis not present

## 2012-05-22 DIAGNOSIS — F4322 Adjustment disorder with anxiety: Secondary | ICD-10-CM | POA: Diagnosis not present

## 2012-06-09 DIAGNOSIS — F4322 Adjustment disorder with anxiety: Secondary | ICD-10-CM | POA: Diagnosis not present

## 2012-06-20 DIAGNOSIS — N183 Chronic kidney disease, stage 3 unspecified: Secondary | ICD-10-CM | POA: Diagnosis not present

## 2012-06-20 DIAGNOSIS — I10 Essential (primary) hypertension: Secondary | ICD-10-CM | POA: Diagnosis not present

## 2012-06-20 DIAGNOSIS — M949 Disorder of cartilage, unspecified: Secondary | ICD-10-CM | POA: Diagnosis not present

## 2012-06-20 DIAGNOSIS — M899 Disorder of bone, unspecified: Secondary | ICD-10-CM | POA: Diagnosis not present

## 2012-08-11 DIAGNOSIS — M1710 Unilateral primary osteoarthritis, unspecified knee: Secondary | ICD-10-CM | POA: Diagnosis not present

## 2012-09-06 DIAGNOSIS — E7800 Pure hypercholesterolemia, unspecified: Secondary | ICD-10-CM | POA: Diagnosis not present

## 2012-09-06 DIAGNOSIS — I5032 Chronic diastolic (congestive) heart failure: Secondary | ICD-10-CM | POA: Diagnosis not present

## 2012-09-06 DIAGNOSIS — N183 Chronic kidney disease, stage 3 unspecified: Secondary | ICD-10-CM | POA: Diagnosis not present

## 2012-09-06 DIAGNOSIS — I10 Essential (primary) hypertension: Secondary | ICD-10-CM | POA: Diagnosis not present

## 2012-10-31 DIAGNOSIS — Z23 Encounter for immunization: Secondary | ICD-10-CM | POA: Diagnosis not present

## 2012-11-01 DIAGNOSIS — L723 Sebaceous cyst: Secondary | ICD-10-CM | POA: Diagnosis not present

## 2012-11-01 DIAGNOSIS — L538 Other specified erythematous conditions: Secondary | ICD-10-CM | POA: Diagnosis not present

## 2012-11-01 DIAGNOSIS — L738 Other specified follicular disorders: Secondary | ICD-10-CM | POA: Diagnosis not present

## 2012-12-21 DIAGNOSIS — I5032 Chronic diastolic (congestive) heart failure: Secondary | ICD-10-CM | POA: Diagnosis not present

## 2012-12-21 DIAGNOSIS — M899 Disorder of bone, unspecified: Secondary | ICD-10-CM | POA: Diagnosis not present

## 2012-12-21 DIAGNOSIS — I10 Essential (primary) hypertension: Secondary | ICD-10-CM | POA: Diagnosis not present

## 2012-12-21 DIAGNOSIS — Z0000 Encounter for general adult medical examination without abnormal findings: Secondary | ICD-10-CM | POA: Diagnosis not present

## 2012-12-21 DIAGNOSIS — R000 Tachycardia, unspecified: Secondary | ICD-10-CM | POA: Diagnosis not present

## 2012-12-21 DIAGNOSIS — N183 Chronic kidney disease, stage 3 unspecified: Secondary | ICD-10-CM | POA: Diagnosis not present

## 2012-12-24 ENCOUNTER — Encounter: Payer: Self-pay | Admitting: Cardiology

## 2012-12-24 DIAGNOSIS — E7800 Pure hypercholesterolemia, unspecified: Secondary | ICD-10-CM | POA: Insufficient documentation

## 2012-12-25 ENCOUNTER — Telehealth: Payer: Self-pay | Admitting: Interventional Cardiology

## 2012-12-25 ENCOUNTER — Encounter: Payer: Self-pay | Admitting: Interventional Cardiology

## 2012-12-25 ENCOUNTER — Ambulatory Visit (INDEPENDENT_AMBULATORY_CARE_PROVIDER_SITE_OTHER): Payer: Medicare Other | Admitting: Interventional Cardiology

## 2012-12-25 VITALS — BP 140/88 | HR 112 | Ht 65.0 in | Wt 185.0 lb

## 2012-12-25 DIAGNOSIS — I10 Essential (primary) hypertension: Secondary | ICD-10-CM

## 2012-12-25 DIAGNOSIS — R000 Tachycardia, unspecified: Secondary | ICD-10-CM | POA: Diagnosis not present

## 2012-12-25 DIAGNOSIS — I5032 Chronic diastolic (congestive) heart failure: Secondary | ICD-10-CM

## 2012-12-25 HISTORY — DX: Chronic diastolic (congestive) heart failure: I50.32

## 2012-12-25 HISTORY — DX: Tachycardia, unspecified: R00.0

## 2012-12-25 MED ORDER — METOPROLOL SUCCINATE ER 100 MG PO TB24: ORAL_TABLET | ORAL | Status: DC

## 2012-12-25 NOTE — Telephone Encounter (Signed)
New message    Calling to give Kelsey Salinas a list of her medications: Acid controller from [place] 20mg  daily; simvastatin 20mg  daily; valsartan HCTZ 320-25mg  daily; metoprolol succer 100mg  bid; baby aspirin daily; omeprazole daily

## 2012-12-25 NOTE — Progress Notes (Signed)
Patient ID: [name], female   DOB: [birth year], 79 y.o.   MRN: [id number]    [address] [place], [place]  [address] Phone: [phone] Fax:  [phone]  Date:  12/25/2012   ID:  [name], DOB 06/22/1926, MRN [id number]  PCP:  No primary provider on file.   ASSESSMENT:  1. Tachycardia, apparent sinus at 112 beats per minute. P wave morphology on EKG is different than prior tracings. This could represent an ectopic atrial tachycardia 2. History of diastolic heart failure by echo  2013 3. History of hypertension  PLAN:  1. Increase metoprolol to 150 mg twice daily. The patient confirmed that she has been taking the medications. We checked with her pharmacy to back to med that the medication dosing is correct as stated by the patient. 2. 48-hour Holter monitor if tachycardia does not resolve with increased medication intensity   SUBJECTIVE: Talaiyah Weekes is a 79 y.o. female who feels well but is referred because of increased heart rate despite high-dose beta blocker therapy. She is on metoprolol succinate 100 mg twice a day. She denies syncope, palpitations, and other CV complaints. No chest pain. Denies orthopnea PND. There is no peripheral edema per   Wt Readings from Last 3 Encounters:  12/25/12 185 lb (83.915 kg)     Past Medical History  Diagnosis Date  . Hypothyroidism     ,as a teenager  . Colon polyp   . Insomnia   . Hypercholesterolemia   . Right rotator cuff tear   . Indigestion   . Diverticulosis   . Diverticular stricture   . Hypertension   . Osteopenia   . OA (osteoarthritis) of hip     Current Outpatient Prescriptions  Medication Sig Dispense Refill  . aspirin 81 MG tablet Take 81 mg by mouth daily.      . Biotin 1000 MCG tablet Take 1,000 mcg by mouth 3 (three) times daily.      . Calcium-Vitamin D-Vitamin K 500-100-40 MG-UNT-MCG CHEW Chew 2 tablets by mouth daily.      . ergocalciferol (VITAMIN D2) 50000 UNITS capsule  Take 50,000 Units by mouth once a week.      . Famotidine (KS ACID CONTROLLER PO) Take 20 mg by mouth as directed.      . loratadine (CLARITIN) 10 MG tablet Take 10 mg by mouth daily.      Marland Kitchen LORazepam (ATIVAN) 0.5 MG tablet Take 0.25 mg by mouth 2 (two) times daily.      . metoprolol succinate (TOPROL-XL) 100 MG 24 hr tablet Take 100 mg by mouth daily. Take with or immediately following a meal.      . omeprazole (PRILOSEC) 20 MG capsule Take 20 mg by mouth daily.      . simvastatin (ZOCOR) 20 MG tablet Take 20 mg by mouth daily.      . valsartan-hydrochlorothiazide (DIOVAN-HCT) 320-25 MG per tablet Take 1 tablet by mouth daily.       No current facility-administered medications for this visit.    Allergies:    Allergies  Allergen Reactions  . Amlodipine Besylate Other (See Comments)    Edema  . Lisinopril Cough  . Sulfamethoxazole-Trimethoprim     Social History:  The patient  reports that she quit smoking about 23 years ago. She does not have any smokeless tobacco history on file. She reports that she drinks alcohol. She reports that she does not use illicit drugs.   ROS:  Please see  the history of present illness.   She denies edema. Weight is been stable. No chest pain. Episodes of syncope or transient neurological symptoms.   All other systems reviewed and negative.   OBJECTIVE: VS:  BP 140/88  Pulse 112  Ht 5\' 5"  (1.651 m)  Wt 185 lb (83.915 kg)  BMI 30.79 kg/m2 Well nourished, well developed, in no acute distress, elderly HEENT: normal Neck: JVD flat. Carotid bruit absent  Cardiac:  normal S1, S2; RRR; no murmur Lungs:  clear to auscultation bilaterally, no wheezing, rhonchi or rales Abd: soft, nontender, no hepatomegaly Ext: Edema absent. Pulses 2+ Skin: warm and dry Neuro:  CNs 2-12 intact, no focal abnormalities noted  EKG:  Sinus tachycardia   . P wave morphology is different than on prior tracings from [place], Illene Labrador III, MD 12/25/2012  3:07 PM  Medical History: Hypothyroidism as teenager (able to stop meds subsequently), Colon polyp (10/01; repeat neg 11/04; negative 2009), Insomnia, Hypercholesterolemia, Rt rotator cuff tear, Indigestion, Diverticulosis and diverticular stricture 2009, OA hip, s/p THR; knee OA, getting intermittent shots - Dr. Maureen Ralphs, Hypertension, osteopenia (11/05, unchanged in 9/08 and 4/12; Actonel stopped 4/12 after five years), Insomnia, wrist rx - Dr. Burney Gauze, ophth - Dr. Herbert Deaner, Chronic diastolic HF.

## 2013-01-17 DIAGNOSIS — M1710 Unilateral primary osteoarthritis, unspecified knee: Secondary | ICD-10-CM | POA: Diagnosis not present

## 2013-01-29 ENCOUNTER — Other Ambulatory Visit: Payer: Self-pay

## 2013-01-29 MED ORDER — METOPROLOL SUCCINATE ER 100 MG PO TB24: ORAL_TABLET | ORAL | Status: DC

## 2013-02-12 ENCOUNTER — Ambulatory Visit: Payer: Medicare Other | Admitting: Interventional Cardiology

## 2013-02-14 ENCOUNTER — Other Ambulatory Visit: Payer: Self-pay

## 2013-02-14 DIAGNOSIS — Z1231 Encounter for screening mammogram for malignant neoplasm of breast: Secondary | ICD-10-CM

## 2013-02-19 ENCOUNTER — Encounter: Payer: Self-pay | Admitting: Interventional Cardiology

## 2013-02-19 ENCOUNTER — Ambulatory Visit (INDEPENDENT_AMBULATORY_CARE_PROVIDER_SITE_OTHER): Payer: Medicare Other | Admitting: Interventional Cardiology

## 2013-02-19 VITALS — BP 122/80 | HR 110 | Ht 65.0 in | Wt 183.0 lb

## 2013-02-19 DIAGNOSIS — E7800 Pure hypercholesterolemia, unspecified: Secondary | ICD-10-CM | POA: Diagnosis not present

## 2013-02-19 DIAGNOSIS — I5032 Chronic diastolic (congestive) heart failure: Secondary | ICD-10-CM

## 2013-02-19 DIAGNOSIS — R000 Tachycardia, unspecified: Secondary | ICD-10-CM | POA: Diagnosis not present

## 2013-02-19 DIAGNOSIS — I10 Essential (primary) hypertension: Secondary | ICD-10-CM | POA: Diagnosis not present

## 2013-02-19 NOTE — Progress Notes (Signed)
Patient ID: Kelsey Salinas, female   DOB: 11-26-1926, 80 y.o.   MRN: [id number]     [address], [address] [place], [place]  [address] Phone: [phone] Fax:  [phone]  Date:  02/19/2013   ID:  Kelsey Salinas, DOB December 25, 1926, MRN [id number]  PCP:  Horton Finer, MD   ASSESSMENT:  1.Chronic diastolic heart failure 2. Hypertension, controlled 3. Tachycardia, resolved 4.  PLAN:  1. Continue current beta blocker dose intensity (150 mg twice a day) 2. Clinical followup in 10-12 months. She is cautioned to call of dyspnea, syncope, edema, or other cardiac complaints   SUBJECTIVE: Kelsey Salinas is a 80 y.o. female who is asymptomatic and returns for evaluation after medication titration .   Wt Readings from Last 3 Encounters:  02/19/13 183 lb (83.008 kg)  12/25/12 185 lb (83.915 kg)     Past Medical History  Diagnosis Date  . Hypothyroidism     ,as a teenager  . Colon polyp   . Insomnia   . Hypercholesterolemia   . Right rotator cuff tear   . Indigestion   . Diverticulosis   . Diverticular stricture   . Hypertension   . Osteopenia   . OA (osteoarthritis) of hip     Current Outpatient Prescriptions  Medication Sig Dispense Refill  . aspirin 81 MG tablet Take 81 mg by mouth daily.      . Biotin 1000 MCG tablet Take 1,000 mcg by mouth 3 (three) times daily.      . Calcium-Vitamin D-Vitamin K 500-100-40 MG-UNT-MCG CHEW Chew 2 tablets by mouth daily.      . ergocalciferol (VITAMIN D2) 50000 UNITS capsule Take 50,000 Units by mouth once a week.      . Famotidine (KS ACID CONTROLLER PO) Take 20 mg by mouth as directed.      . loratadine (CLARITIN) 10 MG tablet Take 10 mg by mouth daily.      Marland Kitchen LORazepam (ATIVAN) 0.5 MG tablet Take 0.25 mg by mouth 2 (two) times daily.      . metoprolol succinate (TOPROL-XL) 100 MG 24 hr tablet Take 1 1/2 tablet twice daily  90 tablet  6  . omeprazole (PRILOSEC) 20 MG capsule Take 20 mg by mouth daily.      .  simvastatin (ZOCOR) 20 MG tablet Take 20 mg by mouth daily.      . valsartan-hydrochlorothiazide (DIOVAN-HCT) 320-25 MG per tablet Take 1 tablet by mouth daily.       No current facility-administered medications for this visit.    Allergies:    Allergies  Allergen Reactions  . Amlodipine Besylate Other (See Comments)    Edema  . Lisinopril Cough  . Sulfamethoxazole-Trimethoprim     Social History:  The patient  reports that she quit smoking about 24 years ago. She does not have any smokeless tobacco history on file. She reports that she drinks alcohol. She reports that she does not use illicit drugs.   ROS:  Please see the history of present illness.   All other systems reviewed and negative.   OBJECTIVE: VS:  BP 122/80  Pulse 110  Ht 5\' 5"  (1.651 m)  Wt 183 lb (83.008 kg)  BMI 30.45 kg/m2 repeat heart rate 96 beats per minute Well nourished, well developed, in no acute distress, appears than his stated age HEENT: normal Neck: JVD flat. Carotid bruit absent Cardiac:  normal S1, S2; RRR; no murmur Lungs:  clear to auscultation bilaterally, no wheezing, rhonchi or  rales Abd: soft, nontender, no hepatomegaly Ext: Edema absent. Pulses 2+ Skin: warm and dry Neuro:  CNs 2-12 intact, no focal abnormalities noted  EKG:  Not repeated     Past Medical History  Hypothyroidism as teenager (able to stop meds subsequently)  Colon polyp (10/01; repeat neg 11/04; negative 2009)  Insomnia  Hypercholesterolemia  Rt rotator cuff tear  Indigestion  Diverticulosis and diverticular stricture 2009  OA hip, s/p THR; knee OA, getting intermittent shots - Dr. Maureen Ralphs  Hypertension  osteopenia (11/05, unchanged in 9/08 and 4/12; Actonel stopped 4/12 after five years)  Insomnia  wrist rx - Dr. Burney Gauze  ophth - Dr. Herbert Deaner  Chronic diastolic HF   Social History     Signed, Illene Labrador III, MD 02/19/2013 12:06 PM

## 2013-02-19 NOTE — Patient Instructions (Signed)
Your physician recommends that you continue on your current medications as directed. Please refer to the Current Medication list given to you today.  Your physician wants you to follow-up in: 10 months You will receive a reminder letter in the mail two months in advance. If you don't receive a letter, please call our office to schedule the follow-up appointment.

## 2013-02-20 ENCOUNTER — Ambulatory Visit: Payer: Medicare Other | Admitting: Interventional Cardiology

## 2013-02-21 DIAGNOSIS — F4322 Adjustment disorder with anxiety: Secondary | ICD-10-CM | POA: Diagnosis not present

## 2013-03-15 ENCOUNTER — Ambulatory Visit: Payer: Medicare Other

## 2013-04-12 ENCOUNTER — Ambulatory Visit
Admission: RE | Admit: 2013-04-12 | Discharge: 2013-04-12 | Disposition: A | Discharge status: Home / Self Care | Payer: Medicare Other | Source: Ambulatory Visit

## 2013-04-12 DIAGNOSIS — Z1231 Encounter for screening mammogram for malignant neoplasm of breast: Secondary | ICD-10-CM | POA: Diagnosis not present

## 2013-05-04 DIAGNOSIS — M1710 Unilateral primary osteoarthritis, unspecified knee: Secondary | ICD-10-CM | POA: Diagnosis not present

## 2013-07-05 DIAGNOSIS — Z961 Presence of intraocular lens: Secondary | ICD-10-CM | POA: Diagnosis not present

## 2013-07-05 DIAGNOSIS — H40019 Open angle with borderline findings, low risk, unspecified eye: Secondary | ICD-10-CM | POA: Diagnosis not present

## 2013-07-05 DIAGNOSIS — H524 Presbyopia: Secondary | ICD-10-CM | POA: Diagnosis not present

## 2013-07-05 DIAGNOSIS — H43819 Vitreous degeneration, unspecified eye: Secondary | ICD-10-CM | POA: Diagnosis not present

## 2013-07-23 DIAGNOSIS — R05 Cough: Secondary | ICD-10-CM | POA: Diagnosis not present

## 2013-07-23 DIAGNOSIS — R059 Cough, unspecified: Secondary | ICD-10-CM | POA: Diagnosis not present

## 2013-08-07 DIAGNOSIS — R05 Cough: Secondary | ICD-10-CM | POA: Diagnosis not present

## 2013-08-07 DIAGNOSIS — R059 Cough, unspecified: Secondary | ICD-10-CM | POA: Diagnosis not present

## 2013-08-07 DIAGNOSIS — N183 Chronic kidney disease, stage 3 unspecified: Secondary | ICD-10-CM | POA: Diagnosis not present

## 2013-08-07 DIAGNOSIS — I10 Essential (primary) hypertension: Secondary | ICD-10-CM | POA: Diagnosis not present

## 2013-08-27 ENCOUNTER — Other Ambulatory Visit: Payer: Self-pay | Admitting: Interventional Cardiology

## 2013-09-07 DIAGNOSIS — S8990XA Unspecified injury of unspecified lower leg, initial encounter: Secondary | ICD-10-CM | POA: Diagnosis not present

## 2013-09-07 DIAGNOSIS — S99929A Unspecified injury of unspecified foot, initial encounter: Secondary | ICD-10-CM | POA: Diagnosis not present

## 2013-09-12 ENCOUNTER — Other Ambulatory Visit: Payer: Self-pay | Admitting: Internal Medicine

## 2013-09-12 DIAGNOSIS — R0989 Other specified symptoms and signs involving the circulatory and respiratory systems: Secondary | ICD-10-CM

## 2013-09-12 DIAGNOSIS — M6281 Muscle weakness (generalized): Secondary | ICD-10-CM | POA: Diagnosis not present

## 2013-09-12 DIAGNOSIS — R269 Unspecified abnormalities of gait and mobility: Secondary | ICD-10-CM | POA: Diagnosis not present

## 2013-09-12 DIAGNOSIS — R279 Unspecified lack of coordination: Secondary | ICD-10-CM | POA: Diagnosis not present

## 2013-09-12 DIAGNOSIS — R609 Edema, unspecified: Secondary | ICD-10-CM

## 2013-09-12 DIAGNOSIS — M159 Polyosteoarthritis, unspecified: Secondary | ICD-10-CM | POA: Diagnosis not present

## 2013-09-12 HISTORY — DX: Edema, unspecified: R60.9

## 2013-09-19 DIAGNOSIS — R279 Unspecified lack of coordination: Secondary | ICD-10-CM | POA: Diagnosis not present

## 2013-09-19 DIAGNOSIS — M6281 Muscle weakness (generalized): Secondary | ICD-10-CM | POA: Diagnosis not present

## 2013-09-19 DIAGNOSIS — R269 Unspecified abnormalities of gait and mobility: Secondary | ICD-10-CM | POA: Diagnosis not present

## 2013-09-19 DIAGNOSIS — M159 Polyosteoarthritis, unspecified: Secondary | ICD-10-CM | POA: Diagnosis not present

## 2013-09-20 ENCOUNTER — Inpatient Hospital Stay: Admission: RE | Admit: 2013-09-20 | Payer: Medicare Other | Source: Ambulatory Visit

## 2013-09-27 DIAGNOSIS — M159 Polyosteoarthritis, unspecified: Secondary | ICD-10-CM | POA: Diagnosis not present

## 2013-09-27 DIAGNOSIS — M6281 Muscle weakness (generalized): Secondary | ICD-10-CM | POA: Diagnosis not present

## 2013-09-27 DIAGNOSIS — R279 Unspecified lack of coordination: Secondary | ICD-10-CM | POA: Diagnosis not present

## 2013-09-27 DIAGNOSIS — R269 Unspecified abnormalities of gait and mobility: Secondary | ICD-10-CM | POA: Diagnosis not present

## 2013-09-28 ENCOUNTER — Ambulatory Visit
Admission: RE | Admit: 2013-09-28 | Discharge: 2013-09-28 | Disposition: A | Discharge status: Home / Self Care | Payer: Medicare Other | Source: Ambulatory Visit | Attending: Internal Medicine | Admitting: Internal Medicine

## 2013-09-28 DIAGNOSIS — R0989 Other specified symptoms and signs involving the circulatory and respiratory systems: Secondary | ICD-10-CM | POA: Diagnosis not present

## 2013-09-28 DIAGNOSIS — M79609 Pain in unspecified limb: Secondary | ICD-10-CM | POA: Diagnosis not present

## 2013-10-01 DIAGNOSIS — M6281 Muscle weakness (generalized): Secondary | ICD-10-CM | POA: Diagnosis not present

## 2013-10-01 DIAGNOSIS — R279 Unspecified lack of coordination: Secondary | ICD-10-CM | POA: Diagnosis not present

## 2013-10-01 DIAGNOSIS — M159 Polyosteoarthritis, unspecified: Secondary | ICD-10-CM | POA: Diagnosis not present

## 2013-10-01 DIAGNOSIS — R269 Unspecified abnormalities of gait and mobility: Secondary | ICD-10-CM | POA: Diagnosis not present

## 2013-10-04 DIAGNOSIS — R269 Unspecified abnormalities of gait and mobility: Secondary | ICD-10-CM | POA: Diagnosis not present

## 2013-10-04 DIAGNOSIS — R279 Unspecified lack of coordination: Secondary | ICD-10-CM | POA: Diagnosis not present

## 2013-10-04 DIAGNOSIS — M6281 Muscle weakness (generalized): Secondary | ICD-10-CM | POA: Diagnosis not present

## 2013-10-04 DIAGNOSIS — M1710 Unilateral primary osteoarthritis, unspecified knee: Secondary | ICD-10-CM | POA: Diagnosis not present

## 2013-10-04 DIAGNOSIS — M159 Polyosteoarthritis, unspecified: Secondary | ICD-10-CM | POA: Diagnosis not present

## 2013-10-11 DIAGNOSIS — M1712 Unilateral primary osteoarthritis, left knee: Secondary | ICD-10-CM | POA: Diagnosis not present

## 2013-10-11 DIAGNOSIS — M6281 Muscle weakness (generalized): Secondary | ICD-10-CM | POA: Diagnosis not present

## 2013-10-11 DIAGNOSIS — R2689 Other abnormalities of gait and mobility: Secondary | ICD-10-CM | POA: Diagnosis not present

## 2013-10-11 DIAGNOSIS — R279 Unspecified lack of coordination: Secondary | ICD-10-CM | POA: Diagnosis not present

## 2013-10-24 DIAGNOSIS — Z23 Encounter for immunization: Secondary | ICD-10-CM | POA: Diagnosis not present

## 2013-10-31 ENCOUNTER — Encounter: Payer: Self-pay | Admitting: Nurse Practitioner

## 2013-10-31 ENCOUNTER — Non-Acute Institutional Stay: Payer: Medicare Other | Admitting: Nurse Practitioner

## 2013-10-31 VITALS — BP 122/80 | HR 76 | Temp 97.4°F | Ht 66.0 in | Wt 184.0 lb

## 2013-10-31 DIAGNOSIS — K30 Functional dyspepsia: Secondary | ICD-10-CM | POA: Insufficient documentation

## 2013-10-31 DIAGNOSIS — I10 Essential (primary) hypertension: Secondary | ICD-10-CM

## 2013-10-31 DIAGNOSIS — R1013 Epigastric pain: Secondary | ICD-10-CM | POA: Diagnosis not present

## 2013-10-31 DIAGNOSIS — E7800 Pure hypercholesterolemia, unspecified: Secondary | ICD-10-CM

## 2013-10-31 DIAGNOSIS — E038 Other specified hypothyroidism: Secondary | ICD-10-CM

## 2013-10-31 DIAGNOSIS — E039 Hypothyroidism, unspecified: Secondary | ICD-10-CM | POA: Insufficient documentation

## 2013-10-31 DIAGNOSIS — M1610 Unilateral primary osteoarthritis, unspecified hip: Secondary | ICD-10-CM | POA: Insufficient documentation

## 2013-10-31 DIAGNOSIS — E559 Vitamin D deficiency, unspecified: Secondary | ICD-10-CM | POA: Insufficient documentation

## 2013-10-31 NOTE — Progress Notes (Signed)
Patient ID: Kelsey Salinas, female   DOB: [birth year], 80 y.o.   MRN: [id number]    Nursing Home Location:  [place] of Service: Clinic (12)  PCP: Estill Dooms, MD  Allergies  Allergen Reactions  . Amlodipine Besylate Other (See Comments)    Edema  . Lisinopril Cough  . Sulfamethoxazole-Trimethoprim     Chief Complaint  Patient presents with  . Medical Management of Chronic Issues    New patient -switching primary doctors    HPI:  80 year old female who has been at [facility] for 5 years, living in [place] apartment. Pt was previously been seen by Dr Maxwell Caul now switching because he is not seeing primary care at this time. pts husband is in memory care at [facility].  Specialist  Cardiology- Dr Daneen Schick- seeing this month Orthopedist- Dr Wynelle Link- going every 4 month for injections for her knees does not wish for surgery Gastroenterologist- Dr Redmond School  Dermatologist- has new referral, has not seen yet Ophthalmologist- Dr Herbert Deaner Dentist- sees routinely  Has no complaints or concerns for today's visit, just to establish care.  Working with a Clinical research associate routinely to help built strength in legs  Review of Systems:  Review of Systems  Constitutional: Negative for activity change, appetite change, fatigue and unexpected weight change.  HENT: Negative for congestion and hearing loss.   Eyes: Negative.   Respiratory: Negative for cough and shortness of breath.   Cardiovascular: Negative for chest pain, palpitations and leg swelling.  Gastrointestinal: Negative for abdominal pain, diarrhea and constipation.  Genitourinary: Negative for dysuria and difficulty urinating.  Musculoskeletal: Positive for arthralgias (bilateral knees). Negative for myalgias.  Skin: Negative for color change and wound.  Neurological: Negative for dizziness and weakness.  Psychiatric/Behavioral: Negative for behavioral problems, confusion and agitation.    Past  Medical History  Diagnosis Date  . Hypothyroidism     ,as a teenager  . Colon polyp   . Insomnia   . Hypercholesterolemia   . Right rotator cuff tear   . Indigestion   . Diverticulosis   . Diverticular stricture   . Hypertension   . Osteopenia   . Diverticulosis 10-30-2007  . 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 Allergy    Past Surgical History  Procedure Laterality Date  . Total hip arthroplasty Right 03-04-08    Dr. Wynelle Link  . Cataract extraction, bilateral    . Orif wrist fracture    . Breast reduction surgery  1999  . Colonoscopy  2009    Dr. Redmond School   Social History:   reports that she quit smoking about 24 years ago. She has never used smokeless tobacco. She reports that she drinks about .6 ounces of alcohol per week. She reports that she does not use illicit drugs.  Family History  Problem Relation Age of Onset  . Heart disease Mother   . Heart disease Father   . Heart disease Brother     Medications: Patient's Medications  New Prescriptions   No medications on file  Previous Medications   ASPIRIN 81 MG TABLET    Take 81 mg by mouth daily.   CALCIUM-VITAMIN D-VITAMIN K 500-100-40 MG-UNT-MCG CHEW    Chew 2 tablets by mouth daily.   ERGOCALCIFEROL (VITAMIN D2) 50000 UNITS CAPSULE    Take 50,000 Units by mouth once a week.   FAMOTIDINE (KS ACID CONTROLLER PO)    Take 20 mg by  mouth as directed.   LORATADINE (CLARITIN) 10 MG TABLET    Take 10 mg by mouth daily.   METOPROLOL SUCCINATE (TOPROL-XL) 100 MG 24 HR TABLET    TAKE 1&1/2 TABLETS TWICE DAILY.   OMEPRAZOLE (PRILOSEC) 20 MG CAPSULE    Take 20 mg by mouth daily.   SIMVASTATIN (ZOCOR) 20 MG TABLET    Take 20 mg by mouth daily.   VALSARTAN-HYDROCHLOROTHIAZIDE (DIOVAN-HCT) 320-25 MG PER TABLET    Take 1 tablet by mouth daily.  Modified Medications   No medications on file  Discontinued Medications   BIOTIN 1000 MCG TABLET    Take 1,000 mcg by mouth 3 (three) times  daily.   LORAZEPAM (ATIVAN) 0.5 MG TABLET    Take 0.25 mg by mouth 2 (two) times daily.     Physical Exam: Filed Vitals:   10/31/13 1449  BP: 122/80  Pulse: 76  Temp: 97.4 F (36.3 C)  TempSrc: Oral  Height: 5\' 6"  (1.676 m)  Weight: 184 lb (83.462 kg)  SpO2: 98%    Physical Exam  Constitutional: She is oriented to person, place, and time. She appears well-developed and well-nourished. No distress.  HENT:  Head: Normocephalic and atraumatic.  Mouth/Throat: Oropharynx is clear and moist. No oropharyngeal exudate.  Eyes: Conjunctivae are normal. Pupils are equal, round, and reactive to light.  Neck: Normal range of motion. Neck supple.  Cardiovascular: Normal rate, regular rhythm and normal heart sounds.   Pulmonary/Chest: Effort normal and breath sounds normal.  Abdominal: Soft. Bowel sounds are normal.  Musculoskeletal: She exhibits no edema and no tenderness.  Neurological: She is alert and oriented to person, place, and time.  Skin: Skin is warm and dry. She is not diaphoretic.  Psychiatric: She has a normal mood and affect.    Labs reviewed: Basic Metabolic Panel: No results found for this basename: NA, K, CL, CO2, GLUCOSE, BUN, CREATININE, CALCIUM, MG, PHOS,  in the last 8760 hours Liver Function Tests: No results found for this basename: AST, ALT, ALKPHOS, BILITOT, PROT, ALBUMIN,  in the last 8760 hours No results found for this basename: LIPASE, AMYLASE,  in the last 8760 hours No results found for this basename: AMMONIA,  in the last 8760 hours CBC: No results found for this basename: WBC, NEUTROABS, HGB, HCT, MCV, PLT,  in the last 8760 hours TSH: No results found for this basename: TSH,  in the last 8760 hours A1C: No results found for this basename: HGBA1C   Lipid Panel: No results found for this basename: CHOL, HDL, LDLCALC, TRIG, CHOLHDL, LDLDIRECT,  in the last 8760 hours   Assessment/Plan  1. Essential hypertension -controlled. conts on diovan,  toprol and asa  2. Hypercholesterolemia -currently on simvastatin -follow up lipids, and cmp before next visit  3. Other specified hypothyroidism -history of hypothyroidism but not currently on medication, will follow up TSH  4. Indigestion -remains controlled on prilosec and famotidine   5. Vitamin D deficiency continues on vit D 5000 units weekly, will follow up vit D level prior to next visit   Will have pt follow up in 1 month for a EV with MMSE and labs with Dr Nyoka Cowden.

## 2013-11-19 ENCOUNTER — Encounter: Payer: Self-pay | Admitting: Interventional Cardiology

## 2013-11-19 ENCOUNTER — Other Ambulatory Visit: Payer: Self-pay | Admitting: *Deleted

## 2013-11-19 ENCOUNTER — Ambulatory Visit (INDEPENDENT_AMBULATORY_CARE_PROVIDER_SITE_OTHER): Payer: Medicare Other | Admitting: Interventional Cardiology

## 2013-11-19 VITALS — BP 132/74 | HR 101 | Ht 67.0 in | Wt 184.4 lb

## 2013-11-19 DIAGNOSIS — R000 Tachycardia, unspecified: Secondary | ICD-10-CM

## 2013-11-19 DIAGNOSIS — I5032 Chronic diastolic (congestive) heart failure: Secondary | ICD-10-CM

## 2013-11-19 DIAGNOSIS — I10 Essential (primary) hypertension: Secondary | ICD-10-CM

## 2013-11-19 MED ORDER — SIMVASTATIN 20 MG PO TABS: ORAL_TABLET | ORAL | Status: DC

## 2013-11-19 NOTE — Progress Notes (Signed)
Patient ID: Kelsey Salinas, female   DOB: 1926-01-19, 80 y.o.   MRN: [id number]    [address], [address], [place]  [address] Phone: [phone] Fax:  [phone]  Date:  11/19/2013   ID:  Kelsey Salinas, DOB 03-12-1926, MRN [id number]  PCP:  Estill Dooms, MD   ASSESSMENT:  1. Chronic diastolic heart failure, asymptomatic 2. Hypertension, controlled  3. Hyperlipidemia on therapy and followed by primary care  PLAN:  1. No specific cardiac evaluation as patient is asymptomatic 2. She is switching primary care physicians to Dr. Jeanmarie Hubert with whom I will correspond with this office note 3. Relative tachycardia with heart rate hovering right at 100 bpm despite beta blocker therapy. We need to be certain that the patient's thyroid function is  normal   SUBJECTIVE: Kelsey Salinas is a 80 y.o. female is doing well. She is at [employer]. She is having no specific cardiac complaints. No dyspnea, palpitations, orthopnea, or other complaints. She has not had syncope. She did have transient lower extremity edema earlier this year but it resolved spontaneously. She tells me that she was switched to Dr. Jeanmarie Hubert is her primary care physician.   Wt Readings from Last 3 Encounters:  11/19/13 184 lb 6.4 oz (83.643 kg)  10/31/13 184 lb (83.462 kg)  02/19/13 183 lb (83.008 kg)     Past Medical History  Diagnosis Date  . Hypothyroidism     ,as a teenager  . Colon polyp   . Insomnia   . Hypercholesterolemia   . Right rotator cuff tear   . Indigestion   . Diverticulosis   . Diverticular stricture   . Hypertension   . Osteopenia   . Diverticulosis 10-30-2007  . 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 Allergy     Current Outpatient Prescriptions  Medication Sig Dispense Refill  . aspirin 81 MG tablet Take 81 mg by mouth daily.    . Calcium-Vitamin D-Vitamin K 500-100-40 MG-UNT-MCG CHEW Chew 2 tablets  by mouth daily.    . ergocalciferol (VITAMIN D2) 50000 UNITS capsule Take 50,000 Units by mouth once a week.    . Famotidine (KS ACID CONTROLLER PO) Take 20 mg by mouth as directed.    . loratadine (CLARITIN) 10 MG tablet Take 10 mg by mouth daily.    . metoprolol succinate (TOPROL-XL) 100 MG 24 hr tablet TAKE 1&1/2 TABLETS TWICE DAILY. 90 tablet 10  . omeprazole (PRILOSEC) 20 MG capsule Take 20 mg by mouth daily.    . simvastatin (ZOCOR) 20 MG tablet Take one tablet by mouth once daily for cholesterol 30 tablet 3  . valsartan-hydrochlorothiazide (DIOVAN-HCT) 320-25 MG per tablet Take 1 tablet by mouth daily.     No current facility-administered medications for this visit.    Allergies:    Allergies  Allergen Reactions  . Amlodipine Besylate Other (See Comments)    Edema  . Lisinopril Cough    Cough   . Sulfamethoxazole-Trimethoprim Other (See Comments)    Unknown reaction     Social History:  The patient  reports that she quit smoking about 24 years ago. She has never used smokeless tobacco. She reports that she drinks about 0.6 oz of alcohol per week. She reports that she does not use illicit drugs.   ROS:  Please see the history of present illness.   Stable appetite. Ambulating with a walker. No falls. Denies  headache, neurological complaints.  All other systems reviewed and negative.   OBJECTIVE: VS:  BP 132/74 mmHg  Pulse 101  Ht 5\' 7"  (1.702 m)  Wt 184 lb 6.4 oz (83.643 kg)  BMI 28.87 kg/m2 Well nourished, well developed, in no acute distress, younger than stated age 85: normal Neck: JVD flat. Carotid bruit absent  Cardiac:  normal S1, S2; RRR; no murmur. Borderline tachycardia Lungs:  clear to auscultation bilaterally, no wheezing, rhonchi or rales Abd: soft, nontender, no hepatomegaly Ext: Edema absent. Pulses 2+ and symmetric upper and lower Skin: warm and dry Neuro:  CNs 2-12 intact, no focal abnormalities noted  EKG:  From 2014 reveals sinus tachycardia but  otherwise unremarkable.       Signed, Illene Labrador III, MD 11/19/2013 9:20 AM

## 2013-11-19 NOTE — Telephone Encounter (Signed)
[facility]  

## 2013-11-19 NOTE — Patient Instructions (Signed)
Your physician recommends that you continue on your current medications as directed. Please refer to the Current Medication list given to you today.  Your physician wants you to follow-up in: 1 year with Dr.Smith You will receive a reminder letter in the mail two months in advance. If you don't receive a letter, please call our office to schedule the follow-up appointment.  

## 2013-11-29 DIAGNOSIS — B372 Candidiasis of skin and nail: Secondary | ICD-10-CM | POA: Diagnosis not present

## 2013-11-29 DIAGNOSIS — L821 Other seborrheic keratosis: Secondary | ICD-10-CM | POA: Diagnosis not present

## 2013-12-04 DIAGNOSIS — E559 Vitamin D deficiency, unspecified: Secondary | ICD-10-CM | POA: Diagnosis not present

## 2013-12-04 DIAGNOSIS — I10 Essential (primary) hypertension: Secondary | ICD-10-CM | POA: Diagnosis not present

## 2013-12-04 DIAGNOSIS — K635 Polyp of colon: Secondary | ICD-10-CM | POA: Diagnosis not present

## 2013-12-04 DIAGNOSIS — E780 Pure hypercholesterolemia: Secondary | ICD-10-CM | POA: Diagnosis not present

## 2013-12-04 DIAGNOSIS — E038 Other specified hypothyroidism: Secondary | ICD-10-CM | POA: Diagnosis not present

## 2013-12-04 LAB — BASIC METABOLIC PANEL
BUN: 32 mg/dL — AB (ref 4–21)
Creatinine: 1.1 mg/dL (ref 0.5–1.1)
Glucose: 89 mg/dL
Potassium: 3.9 mmol/L (ref 3.4–5.3)
Sodium: 141 mmol/L (ref 137–147)

## 2013-12-04 LAB — CBC AND DIFFERENTIAL
HCT: 41 % (ref 36–46)
Hemoglobin: 14 g/dL (ref 12.0–16.0)
Platelets: 210 10*3/uL (ref 150–399)
WBC: 7.3 10^3/mL

## 2013-12-04 LAB — TSH: TSH: 0.84 u[IU]/mL (ref 0.41–5.90)

## 2013-12-04 LAB — LIPID PANEL
Cholesterol: 191 mg/dL (ref 0–200)
HDL: 58 mg/dL (ref 35–70)
LDL Cholesterol: 100 mg/dL
LDl/HDL Ratio: 3.3
Triglycerides: 165 mg/dL — AB (ref 40–160)

## 2013-12-04 LAB — HEPATIC FUNCTION PANEL
ALT: 14 U/L (ref 7–35)
AST: 17 U/L (ref 13–35)
Alkaline Phosphatase: 76 U/L (ref 25–125)
Bilirubin, Total: 0.6 mg/dL

## 2013-12-10 ENCOUNTER — Non-Acute Institutional Stay: Payer: Medicare Other | Admitting: Internal Medicine

## 2013-12-10 ENCOUNTER — Encounter: Payer: Self-pay | Admitting: Internal Medicine

## 2013-12-10 VITALS — BP 116/74 | HR 104 | Temp 97.8°F | Ht 67.0 in | Wt 185.0 lb

## 2013-12-10 DIAGNOSIS — K5660 Unspecified intestinal obstruction: Secondary | ICD-10-CM | POA: Diagnosis not present

## 2013-12-10 DIAGNOSIS — E038 Other specified hypothyroidism: Secondary | ICD-10-CM | POA: Diagnosis not present

## 2013-12-10 DIAGNOSIS — K635 Polyp of colon: Secondary | ICD-10-CM

## 2013-12-10 DIAGNOSIS — M8580 Other specified disorders of bone density and structure, unspecified site: Secondary | ICD-10-CM

## 2013-12-10 DIAGNOSIS — R1013 Epigastric pain: Secondary | ICD-10-CM

## 2013-12-10 DIAGNOSIS — I10 Essential (primary) hypertension: Secondary | ICD-10-CM | POA: Diagnosis not present

## 2013-12-10 DIAGNOSIS — G4700 Insomnia, unspecified: Secondary | ICD-10-CM | POA: Insufficient documentation

## 2013-12-10 DIAGNOSIS — K30 Functional dyspepsia: Secondary | ICD-10-CM

## 2013-12-10 DIAGNOSIS — E7800 Pure hypercholesterolemia, unspecified: Secondary | ICD-10-CM

## 2013-12-10 DIAGNOSIS — N183 Chronic kidney disease, stage 3 unspecified: Secondary | ICD-10-CM | POA: Insufficient documentation

## 2013-12-10 DIAGNOSIS — E559 Vitamin D deficiency, unspecified: Secondary | ICD-10-CM

## 2013-12-10 DIAGNOSIS — L723 Sebaceous cyst: Secondary | ICD-10-CM | POA: Insufficient documentation

## 2013-12-10 DIAGNOSIS — K56699 Other intestinal obstruction unspecified as to partial versus complete obstruction: Secondary | ICD-10-CM | POA: Insufficient documentation

## 2013-12-10 NOTE — Progress Notes (Addendum)
Patient ID: Kelsey Salinas, female   DOB: Aug 13, 1926, 80 y.o.   MRN: [id number]    HISTORY AND PHYSICAL  Location:  [facility] operational officer of Service: Clinic (12)   Extended Emergency Contact Information Primary Emergency Contact: Myrene Galas [place] Phone: [phone] Relation: Daughter  Advanced Directive information    Chief Complaint  Patient presents with  . Annual Exam    Comprehensive Exam: blood pressure, thyroid, cholesterol, Vitamin D deficiency    HPI:  Hypothyroidism: Normal TSH recently. This problem will be resolved.  Insomnia: Sleeping better at night.  Colon polyp: Remote history.  Diverticular stricture: Patient had a barium enema in 2009 showed stricture. I think her last attempt at colonoscopy was done about that time. She denies problems now with constipation or loose stools or abdominal swelling.  Osteopenia: Patient says she had a DEXA scan fall 2014 at Dr. Nancee Liter office.  CKD (chronic kidney disease) stage 3, GFR 30-59 ml/min: New diagnosis.  Essential hypertension: controlled  Hypercholesterolemia: Controlled  Indigestion: Denies heartburn or indigestion  Vitamin D deficiency: Normal values on most recent lab.  Past Medical History  Diagnosis Date  . Hypothyroidism     as a teenager  . Colon polyp   . Insomnia   . Hypercholesterolemia   . Right rotator cuff tear   . Indigestion   . Diverticular stricture   . Hypertension   . Osteopenia   . Diverticulosis 10-30-2007  . 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 Allergy   . Chronic diastolic heart failure [id number]    October 2013 echocardiogram 1. There is moderate concentric left ventricle hypertrophy. 2. Left ventricular ejection fraction estimated by 2D at 60-65 percent. 3. There were no regional wall motion abnormalities. 4. Mild mitral annular calcification. 5. Mild mitral valve regurgitation.  6. There is mild tricuspid regurgitation. 7. Mildly elevated estimated right ventricular systolic pressure. 8. Right ventricular systolic pressure estimated at 35-40 mm Hg. 9. Mild calcification of the aortic valve. 10. Trace aortic valve regurgitation. 11. Analysis of mitral valve inflow, pulmonary vein Doppler and tissue Doppler suggests grade I diastolic dysfunction without elevated left atrial pressure.   . Rapid resting heart rate [id number]    Uncertain cause. Possible ectopic atrial tachycardia. Date entered 12/25/12     Past Surgical History  Procedure Laterality Date  . Total hip arthroplasty Right 03-04-08    Dr. Wynelle Link  . Cataract extraction, bilateral    . Orif wrist fracture    . Breast reduction surgery  1999  . Colonoscopy  2009    Dr. Redmond School    Patient Care Team: Estill Dooms, MD as PCP - General (Internal Medicine) Lauree Chandler, NP as Nurse Practitioner (Nurse Practitioner) Sinclair Grooms, MD as Consulting Physician (Cardiology) Gearlean Alf, MD as Consulting Physician (Orthopedic Surgery) Sinclair Grooms, MD as Consulting Physician (Cardiology) Johna Sheriff, MD as Consulting Physician (Ophthalmology) Lorn Junes, MD as Consulting Physician (Orthopedic Surgery)  History   Social History  . Marital Status: Married    Spouse Name: N/A    Number of Children: N/A  . Years of Education: N/A   Occupational History  . retired travel Music therapist    Social History Main Topics  . Smoking status: Former Smoker    Quit date: 01/10/1989  . Smokeless tobacco: Never Used  . Alcohol Use: 0.6 oz/week    1  Glasses of wine per week     Comment: 1-2 drinks per week  . Drug Use: No  . Sexual Activity: No   Other Topics Concern  . Not on file   Social History Narrative   Lives at [facility] since 2010   Husband Ilona Sorrel married 1952 (husband in memory care unit at [facility])   Former smoker 1/2 PPD, for 30 years, stopped 01/10/1989   Alcohol 2 glasses  of wine at night   Exercise: machines 3 times a week and daily walk   No POA/LW              reports that she quit smoking about 24 years ago. She has never used smokeless tobacco. She reports that she drinks about 0.6 oz of alcohol per week. She reports that she does not use illicit drugs.  Family History  Problem Relation Age of Onset  . Heart disease Mother   . Heart disease Father   . Heart disease Brother    Family Status  Relation Status Death Age  . Mother Deceased 49  . Father Deceased 36  . Brother Deceased 75  . Daughter Alive   . Son Alive   . Daughter Alive     Immunization History  Administered Date(s) Administered  . Influenza-Unspecified 10/25/2013    Allergies  Allergen Reactions  . Amlodipine Besylate Other (See Comments)    Edema  . Lisinopril Cough    Cough   . Sulfamethoxazole-Trimethoprim Other (See Comments)    Unknown reaction     Medications: Patient's Medications  New Prescriptions   No medications on file  Previous Medications   ASPIRIN 81 MG TABLET    Take 81 mg by mouth daily.   CALCIUM-VITAMIN D-VITAMIN K 500-100-40 MG-UNT-MCG CHEW    Chew 2 tablets by mouth daily.   CICLOPIROX (LOPROX) 0.77 % CREAM    Apply twice daily under breast   ERGOCALCIFEROL (VITAMIN D2) 50000 UNITS CAPSULE    Take 50,000 Units by mouth once a week.   FAMOTIDINE (KS ACID CONTROLLER PO)    Take 20 mg by mouth as directed.   LORATADINE (CLARITIN) 10 MG TABLET    Take 10 mg by mouth daily.   METOPROLOL SUCCINATE (TOPROL-XL) 100 MG 24 HR TABLET    TAKE 1&1/2 TABLETS TWICE DAILY.   OMEPRAZOLE (PRILOSEC) 20 MG CAPSULE    Take 20 mg by mouth daily.   SIMVASTATIN (ZOCOR) 20 MG TABLET    Take one tablet by mouth once daily for cholesterol   VALSARTAN-HYDROCHLOROTHIAZIDE (DIOVAN-HCT) 320-25 MG PER TABLET    Take 1 tablet by mouth daily.  Modified Medications   No medications on file  Discontinued Medications   No medications on file    Review of Systems    Constitutional: Negative for fever, chills, diaphoresis, activity change, appetite change, fatigue and unexpected weight change.  HENT: Negative for congestion, ear discharge, ear pain, hearing loss, postnasal drip, rhinorrhea, sore throat, tinnitus, trouble swallowing and voice change.   Eyes: Negative for pain, redness, itching and visual disturbance.  Respiratory: Negative for cough, choking, shortness of breath and wheezing.   Cardiovascular: Negative for chest pain, palpitations and leg swelling.  Gastrointestinal: Negative for nausea, abdominal pain, diarrhea, constipation and abdominal distention.       Indigestion from time to time.  Endocrine: Negative for cold intolerance, heat intolerance, polydipsia, polyphagia and polyuria.  Genitourinary: Negative for dysuria, urgency, frequency, hematuria, flank pain, vaginal discharge, difficulty urinating and pelvic pain.  Musculoskeletal:  Positive for arthralgias. Negative for myalgias, back pain, gait problem, neck pain and neck stiffness.       Painful knees. She says she needs surgery, but is delaying it due to fear of the recovery and the fact her husband is in memory Care at [facility].  Skin: Negative for color change, pallor and rash.  Allergic/Immunologic: Negative.   Neurological: Negative for dizziness, tremors, seizures, syncope, weakness, numbness and headaches.  Hematological: Negative for adenopathy. Does not bruise/bleed easily.  Psychiatric/Behavioral: Positive for sleep disturbance (improved). Negative for suicidal ideas, hallucinations, behavioral problems, confusion, dysphoric mood and agitation. The patient is not nervous/anxious and is not hyperactive.     Filed Vitals:   12/10/13 1444  BP: 116/74  Pulse: 104  Temp: 97.8 F (36.6 C)  TempSrc: Oral  Height: 5\' 7"  (1.702 m)  Weight: 185 lb (83.915 kg)  SpO2: 91%   Body mass index is 28.97 kg/(m^2).  Physical Exam  Constitutional: She is oriented to person, place, and  time. She appears well-developed and well-nourished. No distress.  HENT:  Right Ear: External ear normal.  Left Ear: External ear normal.  Nose: Nose normal.  Mouth/Throat: Oropharynx is clear and moist. No oropharyngeal exudate.  Eyes: Conjunctivae and EOM are normal. Pupils are equal, round, and reactive to light. No scleral icterus.  Corrective lenses  Neck: No JVD present. No tracheal deviation present. No thyromegaly present.  Cardiovascular: Normal rate, regular rhythm and normal heart sounds.  Exam reveals no gallop and no friction rub.   No murmur heard. Diminished pedal pulses  Pulmonary/Chest: Effort normal. No respiratory distress. She has no wheezes. She has no rales. She exhibits no tenderness.  Abdominal: She exhibits no distension and no mass. There is no tenderness.  Musculoskeletal: Normal range of motion. She exhibits no edema or tenderness.  Scar right hip from THR. Crepitance in knees with movement, L>R.  Lymphadenopathy:    She has no cervical adenopathy.  Neurological: She is alert and oriented to person, place, and time. No cranial nerve deficit. Coordination normal.  12/10/13 MMSE 30/30. Passed clock drawing.  Skin: No rash noted. She is not diaphoretic. No erythema. No pallor.  2 sebaceous cysts of the upper back  Psychiatric: She has a normal mood and affect. Her behavior is normal. Judgment and thought content normal.     Labs reviewed: Nursing Home on 12/10/2013  Component Date Value Ref Range Status  . Hemoglobin 12/04/2013 14.0  12.0 - 16.0 g/dL Final  . HCT 12/04/2013 41  36 - 46 % Final  . Platelets 12/04/2013 210  150 - 399 K/L Final  . WBC 12/04/2013 7.3   Final  . Glucose 12/04/2013 89   Final  . BUN 12/04/2013 32* 4 - 21 mg/dL Final  . Creatinine 12/04/2013 1.1  0.5 - 1.1 mg/dL Final  . Potassium 12/04/2013 3.9  3.4 - 5.3 mmol/L Final  . Sodium 12/04/2013 141  137 - 147 mmol/L Final  . LDl/HDL Ratio 12/04/2013 3.3   Final  . Triglycerides  12/04/2013 165* 40 - 160 mg/dL Final  . Cholesterol 12/04/2013 191  0 - 200 mg/dL Final  . HDL 12/04/2013 58  35 - 70 mg/dL Final  . LDL Cholesterol 12/04/2013 100   Final  . Alkaline Phosphatase 12/04/2013 76  25 - 125 U/L Final  . ALT 12/04/2013 14  7 - 35 U/L Final  . AST 12/04/2013 17  13 - 35 U/L Final  . Bilirubin, Total 12/04/2013 0.6  Final  . TSH 12/04/2013 0.84  0.41 - 5.90 uIU/mL Final       Assessment/Plan  1. Other specified hypothyroidism Normal TSH.  2. Insomnia Improved  3. Colon polyp We will get records from Dr. Maxwell Caul  4. Diverticular stricture Records requested from Dr. Maxwell Caul  5. Osteopenia Not sure she has ever been treated for this. We will try to obtain last DEXA scan  6. CKD (chronic kidney disease) stage 3, GFR 30-59 ml/min Mild  7. Essential hypertension Controlled  8. Hypercholesterolemia Controlled  9. Indigestion Controlled  10. Vitamin D deficiency Compensated

## 2013-12-10 NOTE — Progress Notes (Signed)
Passed clock drawing 

## 2013-12-19 ENCOUNTER — Encounter: Payer: Self-pay | Admitting: Internal Medicine

## 2013-12-20 DIAGNOSIS — F4322 Adjustment disorder with anxiety: Secondary | ICD-10-CM | POA: Diagnosis not present

## 2013-12-24 ENCOUNTER — Encounter: Payer: Self-pay | Admitting: Internal Medicine

## 2013-12-26 ENCOUNTER — Encounter: Payer: Self-pay | Admitting: Internal Medicine

## 2014-01-10 DIAGNOSIS — M1712 Unilateral primary osteoarthritis, left knee: Secondary | ICD-10-CM | POA: Diagnosis not present

## 2014-01-10 DIAGNOSIS — M1711 Unilateral primary osteoarthritis, right knee: Secondary | ICD-10-CM | POA: Diagnosis not present

## 2014-01-24 DIAGNOSIS — F4322 Adjustment disorder with anxiety: Secondary | ICD-10-CM | POA: Diagnosis not present

## 2014-02-19 DIAGNOSIS — L600 Ingrowing nail: Secondary | ICD-10-CM | POA: Diagnosis not present

## 2014-02-19 DIAGNOSIS — M79672 Pain in left foot: Secondary | ICD-10-CM | POA: Diagnosis not present

## 2014-02-19 DIAGNOSIS — L84 Corns and callosities: Secondary | ICD-10-CM | POA: Diagnosis not present

## 2014-02-19 DIAGNOSIS — M79671 Pain in right foot: Secondary | ICD-10-CM | POA: Diagnosis not present

## 2014-02-19 DIAGNOSIS — B351 Tinea unguium: Secondary | ICD-10-CM | POA: Diagnosis not present

## 2014-03-19 DIAGNOSIS — Z09 Encounter for follow-up examination after completed treatment for conditions other than malignant neoplasm: Secondary | ICD-10-CM | POA: Diagnosis not present

## 2014-03-20 ENCOUNTER — Other Ambulatory Visit: Payer: Self-pay | Admitting: Internal Medicine

## 2014-04-13 ENCOUNTER — Other Ambulatory Visit: Payer: Self-pay | Admitting: Internal Medicine

## 2014-05-08 DIAGNOSIS — H01119 Allergic dermatitis of unspecified eye, unspecified eyelid: Secondary | ICD-10-CM | POA: Diagnosis not present

## 2014-05-08 DIAGNOSIS — B372 Candidiasis of skin and nail: Secondary | ICD-10-CM | POA: Diagnosis not present

## 2014-05-30 DIAGNOSIS — I10 Essential (primary) hypertension: Secondary | ICD-10-CM | POA: Diagnosis not present

## 2014-05-30 DIAGNOSIS — E039 Hypothyroidism, unspecified: Secondary | ICD-10-CM | POA: Diagnosis not present

## 2014-05-30 DIAGNOSIS — N183 Chronic kidney disease, stage 3 (moderate): Secondary | ICD-10-CM | POA: Diagnosis not present

## 2014-05-30 LAB — BASIC METABOLIC PANEL WITH GFR
BUN: 30 mg/dL — AB (ref 4–21)
Creatinine: 1 mg/dL (ref 0.5–1.1)
Glucose: 103 mg/dL
Potassium: 4.4 mmol/L (ref 3.4–5.3)
Sodium: 141 mmol/L (ref 137–147)

## 2014-05-30 LAB — CBC AND DIFFERENTIAL
HCT: 41 % (ref 36–46)
Hemoglobin: 14 g/dL (ref 12.0–16.0)
Platelets: 215 K/µL (ref 150–399)
WBC: 8.1 10*3/mL

## 2014-05-30 LAB — TSH: TSH: 0.7 u[IU]/mL (ref 0.41–5.90)

## 2014-06-03 ENCOUNTER — Encounter: Payer: Self-pay | Admitting: Internal Medicine

## 2014-06-04 ENCOUNTER — Encounter: Payer: Self-pay | Admitting: Internal Medicine

## 2014-06-04 ENCOUNTER — Non-Acute Institutional Stay: Payer: Medicare Other | Admitting: Internal Medicine

## 2014-06-04 VITALS — BP 122/76 | HR 84 | Temp 97.4°F | Wt 189.0 lb

## 2014-06-04 DIAGNOSIS — N183 Chronic kidney disease, stage 3 unspecified: Secondary | ICD-10-CM

## 2014-06-04 DIAGNOSIS — E038 Other specified hypothyroidism: Secondary | ICD-10-CM

## 2014-06-04 DIAGNOSIS — I10 Essential (primary) hypertension: Secondary | ICD-10-CM

## 2014-06-04 DIAGNOSIS — M8580 Other specified disorders of bone density and structure, unspecified site: Secondary | ICD-10-CM | POA: Diagnosis not present

## 2014-06-04 DIAGNOSIS — E7800 Pure hypercholesterolemia, unspecified: Secondary | ICD-10-CM

## 2014-06-04 DIAGNOSIS — Z23 Encounter for immunization: Secondary | ICD-10-CM

## 2014-06-04 DIAGNOSIS — E559 Vitamin D deficiency, unspecified: Secondary | ICD-10-CM

## 2014-06-04 NOTE — Progress Notes (Signed)
Patient ID: Kelsey Salinas, female   DOB: August 03, 1926, 81 y.o.   MRN: [id number]   Location:  [facility]  Goals of Care:Advanced Directive information Does patient have an advance directive?: Yes, Type of Advance Directive: [place];Living will, Does patient want to make changes to advanced directive?: No - Patient declined  Chief Complaint  Patient presents with  . Medical Management of Chronic Issues    blood pressure, thyroid    HPI: Patient is a 81 y.o.  seen in the [facility] today for med mgt of chronic diseases.    Has no significant complaints she says.  If she sits a lot, her feet swell a lot.  If lays down on the bed, they go down again.  No shortness of breath on exertion.    Has sinus trouble this time of year with runny nose and some cough.  Doesn't take anything for it.  Feels like it's lasting longer than usual.  Says coping ok.  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    Thyroid normal in November.  BP good today.   Reviewed all nov labs.    Says she has not been as active as she'd been.  Spends an hour to an hour 1/2 with her husband in memory care and she was given candy she likes for Mother's day.  Used to walk more.   Review of Systems:  Review of Systems  Constitutional: Negative for fever and chills.  HENT: Negative for congestion and hearing loss.   Eyes: Negative for blurred vision.       Glasses  Respiratory: Negative for shortness of breath.   Cardiovascular: Positive for leg swelling. Negative for chest pain.  Gastrointestinal: Negative for abdominal pain, constipation, blood in stool and melena.  Genitourinary: Negative for dysuria, urgency and frequency.  Musculoskeletal: Negative for falls.  Skin: Negative for rash.  Neurological: Negative for dizziness, loss of consciousness and headaches.  Psychiatric/Behavioral: Negative for depression and memory loss.    Past Medical History  Diagnosis Date  . Hypothyroidism     as a  teenager  . Colon polyp   . Insomnia   . Hypercholesterolemia   . Right rotator cuff tear   . Indigestion   . Diverticular stricture 2009  . Hypertension   . Osteopenia     Actonel stopped in 2012 after 5years therapy  . Diverticulosis 10-30-2007  . 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 Allergy   . Chronic diastolic heart failure [id number]    October 2013 echocardiogram 1. There is moderate concentric left ventricle hypertrophy. 2. Left ventricular ejection fraction estimated by 2D at 60-65 percent. 3. There were no regional wall motion abnormalities. 4. Mild mitral annular calcification. 5. Mild mitral valve regurgitation. 6. There is mild tricuspid regurgitation. 7. Mildly elevated estimated right ventricular systolic pressure. 8. Right ventricular systolic pressure estimated at 35-40 mm Hg. 9. Mild calcification of the aortic valve. 10. Trace aortic valve regurgitation. 11. Analysis of mitral valve inflow, pulmonary vein Doppler and tissue Doppler suggests grade I diastolic dysfunction without elevated left atrial pressure.   . Rapid resting heart rate [id number]    Uncertain cause. Possible ectopic atrial tachycardia. Date entered 12/25/12   . Hypertension, essential   . Edema 09/12/13    Past Surgical History  Procedure Laterality Date  . Total hip arthroplasty Right 03-04-08    Dr. Wynelle Link  . Cataract extraction, bilateral    .  Orif wrist fracture    . Breast reduction surgery  1999  . Colonoscopy  2009    Dr. Redmond School    Social History:   reports that she quit smoking about 25 years ago. She has never used smokeless tobacco. She reports that she drinks about 0.6 oz of alcohol per week. She reports that she does not use illicit drugs.  Allergies  Allergen Reactions  . Amlodipine Besylate Other (See Comments)    Edema  . Lisinopril Cough    Cough   . Sulfamethoxazole-Trimethoprim Other (See Comments)    Unknown reaction      Medications: Patient's Medications  New Prescriptions   No medications on file  Previous Medications   ASPIRIN 81 MG TABLET    Take 81 mg by mouth daily.   CALCIUM-VITAMIN D-VITAMIN K 500-100-40 MG-UNT-MCG CHEW    Chew 2 tablets by mouth daily.   CICLOPIROX (LOPROX) 0.77 % CREAM    Apply twice daily under breast   ERGOCALCIFEROL (VITAMIN D2) 50000 UNITS CAPSULE    Take 50,000 Units by mouth once a week.   FAMOTIDINE (KS ACID CONTROLLER PO)    Take 20 mg by mouth as directed.   LORATADINE (CLARITIN) 10 MG TABLET    Take 10 mg by mouth daily.   METOPROLOL SUCCINATE (TOPROL-XL) 100 MG 24 HR TABLET    TAKE 1&1/2 TABLETS TWICE DAILY.   OMEPRAZOLE (PRILOSEC) 20 MG CAPSULE    Take 20 mg by mouth daily.   SIMVASTATIN (ZOCOR) 20 MG TABLET    TAKE 1 TABLET ONCE DAILY.   VALSARTAN-HYDROCHLOROTHIAZIDE (DIOVAN-HCT) 320-25 MG PER TABLET    TAKE 1 TABLET EACH DAY.  Modified Medications   No medications on file  Discontinued Medications   No medications on file     Physical Exam: Filed Vitals:   06/04/14 1626  BP: 122/76  Pulse: 84  Temp: 97.4 F (36.3 C)  TempSrc: Oral  Weight: 189 lb (85.73 kg)   Body mass index is 29.59 kg/(m^2). Physical Exam  Constitutional: She is oriented to person, place, and time. She appears well-developed and well-nourished. No distress.  Eyes:  glasses  Cardiovascular: Normal rate, regular rhythm, normal heart sounds and intact distal pulses.   Pulmonary/Chest: Effort normal and breath sounds normal. No respiratory distress.  Abdominal: Soft. Bowel sounds are normal.  Musculoskeletal: Normal range of motion.  Walks with rollator walker  Neurological: She is alert and oriented to person, place, and time.  Skin: Skin is warm and dry.  Psychiatric: She has a normal mood and affect.     Labs reviewed: Basic Metabolic Panel:  Recent Labs  12/04/13  NA 141  K 3.9  BUN 32*  CREATININE 1.1  TSH 0.84   Liver Function Tests:  Recent Labs   12/04/13  AST 17  ALT 14  ALKPHOS 76   No results for input(s): LIPASE, AMYLASE in the last 8760 hours. No results for input(s): AMMONIA in the last 8760 hours. CBC:  Recent Labs  12/04/13  WBC 7.3  HGB 14.0  HCT 41  PLT 210   Lipid Panel:  Recent Labs  12/04/13  CHOL 191  HDL 58  LDLCALC 100  TRIG 165*   No results found for: HGBA1C  Patient Care Team: Gayland Curry, DO as PCP - General (Geriatric Medicine) Belva Crome, MD as Consulting Physician (Cardiology) Gaynelle Arabian, MD as Consulting Physician (Orthopedic Surgery) Belva Crome, MD as Consulting Physician (Cardiology) Monna Fam, MD as Consulting Physician (  Ophthalmology) Elsie Saas, MD as Consulting Physician (Orthopedic Surgery)  Assessment/Plan 1. Other specified hypothyroidism -not currently on synthroid, will monitor tsh  2. Osteopenia -last bone density 12/14 at [facility], to be scanned -cont vitamin D supplement--? Ever on other treatment  3. CKD (chronic kidney disease) stage 3, GFR 30-59 ml/min -cont to avoid nsaids, is on ARB  4. Hypercholesterolemia -cont zocor for this, monitor FLP  5. Essential hypertension -bp at goal with toprol, diovan-hct  6. Vitamin D deficiency -cont vitamin D 20000 units weekly  7. Need for vaccination with 13-polyvalent pneumococcal conjugate vaccine - Pneumococcal conjugate vaccine 13-valent given  Labs/tests ordered:  Cbc, cmp, flp before annual exam Next appt:  6 mos annual exam  Catlyn Shipton L. Breylon Sherrow, D.O. [facility] [address], [place] [address] Cell Phone (Mon-Fri 8am-5pm):  [phone] On Call:  [phone] & follow prompts after 5pm & weekends Office Phone:  [phone] Office Fax:  [phone]

## 2014-06-06 ENCOUNTER — Telehealth: Payer: Self-pay

## 2014-06-06 NOTE — Telephone Encounter (Signed)
Left message on voice mail that lab work done 05/30/14 was normal, blood count, glucose, kidney, thyroid. If any questions please call. Abstracted labs.

## 2014-06-25 DIAGNOSIS — H0012 Chalazion right lower eyelid: Secondary | ICD-10-CM | POA: Diagnosis not present

## 2014-07-12 ENCOUNTER — Other Ambulatory Visit: Payer: Self-pay | Admitting: Internal Medicine

## 2014-07-16 DIAGNOSIS — M170 Bilateral primary osteoarthritis of knee: Secondary | ICD-10-CM | POA: Diagnosis not present

## 2014-07-16 DIAGNOSIS — M1712 Unilateral primary osteoarthritis, left knee: Secondary | ICD-10-CM | POA: Diagnosis not present

## 2014-07-16 DIAGNOSIS — M1711 Unilateral primary osteoarthritis, right knee: Secondary | ICD-10-CM | POA: Diagnosis not present

## 2014-07-17 ENCOUNTER — Other Ambulatory Visit: Payer: Self-pay | Admitting: Internal Medicine

## 2014-07-17 DIAGNOSIS — M170 Bilateral primary osteoarthritis of knee: Secondary | ICD-10-CM | POA: Diagnosis not present

## 2014-07-17 DIAGNOSIS — R2681 Unsteadiness on feet: Secondary | ICD-10-CM | POA: Diagnosis not present

## 2014-07-17 DIAGNOSIS — M6281 Muscle weakness (generalized): Secondary | ICD-10-CM | POA: Diagnosis not present

## 2014-07-27 ENCOUNTER — Other Ambulatory Visit: Payer: Self-pay | Admitting: Interventional Cardiology

## 2014-07-29 ENCOUNTER — Other Ambulatory Visit: Payer: Self-pay

## 2014-07-29 MED ORDER — METOPROLOL SUCCINATE ER 100 MG PO TB24: ORAL_TABLET | ORAL | Status: DC

## 2014-07-30 DIAGNOSIS — R2681 Unsteadiness on feet: Secondary | ICD-10-CM | POA: Diagnosis not present

## 2014-07-30 DIAGNOSIS — M170 Bilateral primary osteoarthritis of knee: Secondary | ICD-10-CM | POA: Diagnosis not present

## 2014-07-30 DIAGNOSIS — M6281 Muscle weakness (generalized): Secondary | ICD-10-CM | POA: Diagnosis not present

## 2014-08-12 DIAGNOSIS — M6281 Muscle weakness (generalized): Secondary | ICD-10-CM | POA: Diagnosis not present

## 2014-08-12 DIAGNOSIS — R2681 Unsteadiness on feet: Secondary | ICD-10-CM | POA: Diagnosis not present

## 2014-08-12 DIAGNOSIS — M170 Bilateral primary osteoarthritis of knee: Secondary | ICD-10-CM | POA: Diagnosis not present

## 2014-08-16 ENCOUNTER — Other Ambulatory Visit: Payer: Self-pay

## 2014-08-16 DIAGNOSIS — Z1231 Encounter for screening mammogram for malignant neoplasm of breast: Secondary | ICD-10-CM

## 2014-08-27 ENCOUNTER — Ambulatory Visit: Payer: Medicare Other

## 2014-09-09 ENCOUNTER — Ambulatory Visit
Admission: RE | Admit: 2014-09-09 | Discharge: 2014-09-09 | Disposition: A | Discharge status: Home / Self Care | Payer: Medicare Other | Source: Ambulatory Visit

## 2014-09-09 DIAGNOSIS — Z1231 Encounter for screening mammogram for malignant neoplasm of breast: Secondary | ICD-10-CM | POA: Diagnosis not present

## 2014-09-12 DIAGNOSIS — B372 Candidiasis of skin and nail: Secondary | ICD-10-CM | POA: Diagnosis not present

## 2014-09-12 DIAGNOSIS — L728 Other follicular cysts of the skin and subcutaneous tissue: Secondary | ICD-10-CM | POA: Diagnosis not present

## 2014-09-12 DIAGNOSIS — L821 Other seborrheic keratosis: Secondary | ICD-10-CM | POA: Diagnosis not present

## 2014-09-18 ENCOUNTER — Other Ambulatory Visit: Payer: Self-pay | Admitting: Interventional Cardiology

## 2014-10-31 DIAGNOSIS — Z23 Encounter for immunization: Secondary | ICD-10-CM | POA: Diagnosis not present

## 2014-11-18 ENCOUNTER — Encounter: Payer: Self-pay | Admitting: Interventional Cardiology

## 2014-12-24 ENCOUNTER — Ambulatory Visit (INDEPENDENT_AMBULATORY_CARE_PROVIDER_SITE_OTHER): Payer: Medicare Other | Admitting: Interventional Cardiology

## 2014-12-24 ENCOUNTER — Encounter: Payer: Self-pay | Admitting: Interventional Cardiology

## 2014-12-24 VITALS — BP 136/88 | HR 92 | Ht 67.0 in | Wt 189.4 lb

## 2014-12-24 DIAGNOSIS — I10 Essential (primary) hypertension: Secondary | ICD-10-CM | POA: Diagnosis not present

## 2014-12-24 DIAGNOSIS — I5032 Chronic diastolic (congestive) heart failure: Secondary | ICD-10-CM

## 2014-12-24 DIAGNOSIS — E7800 Pure hypercholesterolemia, unspecified: Secondary | ICD-10-CM | POA: Diagnosis not present

## 2014-12-24 DIAGNOSIS — R000 Tachycardia, unspecified: Secondary | ICD-10-CM

## 2014-12-24 NOTE — Patient Instructions (Signed)

## 2014-12-24 NOTE — Progress Notes (Signed)
Cardiology Office Note   Date:  12/24/2014   ID:  Kelsey Salinas, DOB 29-Sep-1926, MRN [id number]  PCP:  Hollace Kinnier, DO  Cardiologist:  Sinclair Grooms, MD   Chief Complaint  Patient presents with  . Congestive Heart Failure      History of Present Illness: Kelsey Salinas is a 81 y.o. female who presents for diastolic heart failure, tachycardia, and hypertension.  She is asymptomatic. She denies dyspnea, orthopnea, and edema. No medication side effects. She has not had syncope. She denies chest pain.    Past Medical History  Diagnosis Date  . Hypothyroidism     as a teenager  . Colon polyp   . Insomnia   . Hypercholesterolemia   . Right rotator cuff tear   . Indigestion   . Diverticular stricture ([place]) 2009  . Hypertension   . Osteopenia     Actonel stopped in 2012 after 5years therapy  . Diverticulosis 10-30-2007  . 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 Allergy   . Chronic diastolic heart failure [facility]) 12/25/2012    October 2013 echocardiogram 1. There is moderate concentric left ventricle hypertrophy. 2. Left ventricular ejection fraction estimated by 2D at 60-65 percent. 3. There were no regional wall motion abnormalities. 4. Mild mitral annular calcification. 5. Mild mitral valve regurgitation. 6. There is mild tricuspid regurgitation. 7. Mildly elevated estimated right ventricular systolic pressure. 8. Right ventricular systolic pressure estimated at 35-40 mm Hg. 9. Mild calcification of the aortic valve. 10. Trace aortic valve regurgitation. 11. Analysis of mitral valve inflow, pulmonary vein Doppler and tissue Doppler suggests grade I diastolic dysfunction without elevated left atrial pressure.   . Rapid resting heart rate [id number]    Uncertain cause. Possible ectopic atrial tachycardia. Date entered 12/25/12   . Hypertension, essential   . Edema 09/12/13    Past Surgical History  Procedure Laterality Date  .  Total hip arthroplasty Right 03-04-08    Dr. Wynelle Link  . Cataract extraction, bilateral    . Orif wrist fracture    . Breast reduction surgery  1999  . Colonoscopy  2009    Dr. Redmond School     Current Outpatient Prescriptions  Medication Sig Dispense Refill  . aspirin 81 MG tablet Take 81 mg by mouth daily.    . Calcium-Vitamin D-Vitamin K 500-100-40 MG-UNT-MCG CHEW Chew 2 tablets by mouth daily.    . ciclopirox (LOPROX) 0.77 % cream Apply twice daily under breast  0  . ergocalciferol (VITAMIN D2) 50000 UNITS capsule Take 50,000 Units by mouth once a week.    . metoprolol succinate (TOPROL-XL) 100 MG 24 hr tablet TAKE 1&1/2 TABLETS TWICE DAILY. 90 tablet 3  . omeprazole (PRILOSEC) 20 MG capsule Take 20 mg by mouth daily.    Marland Kitchen OVER THE COUNTER MEDICATION Take 1 tablet by mouth daily. Med Name: ACID REDUCER    . simvastatin (ZOCOR) 20 MG tablet TAKE 1 TABLET ONCE DAILY. 30 tablet 5  . valsartan-hydrochlorothiazide (DIOVAN-HCT) 320-25 MG per tablet TAKE 1 TABLET EACH DAY. 90 tablet 2   No current facility-administered medications for this visit.    Allergies:   Amlodipine besylate; Lisinopril; and Sulfamethoxazole-trimethoprim    Social History:  The patient  reports that she quit smoking about 25 years ago. She has never used smokeless tobacco. She reports that she drinks about 0.6 oz of alcohol per week. She reports that she does not use illicit drugs.  Family History:  The patient's family history includes Heart disease in her brother, father, and mother.    ROS:  Please see the history of present illness.   Otherwise, review of systems are positive for increased frailty leading to difficulty with mobility and weakness.   All other systems are reviewed and negative.    PHYSICAL EXAM: VS:  BP 136/88 mmHg  Pulse 92  Ht 5\' 7"  (1.702 m)  Wt 189 lb 6.4 oz (85.911 kg)  BMI 29.66 kg/m2 , BMI Body mass index is 29.66 kg/(m^2). GEN: Well nourished, well developed, in no acute  distress HEENT: normal Neck: no JVD, carotid bruits, or masses Cardiac: RRR.  There is no murmur, rub, or gallop. There is no edema. Respiratory:  clear to auscultation bilaterally, normal work of breathing. GI: soft, nontender, nondistended, + BS MS: no deformity or atrophy Skin: warm and dry, no rash Neuro:  Strength and sensation are intact Psych: euthymic mood, full affect   EKG:  EKG is ordered today. The ekg reveals normal sinus rhythm at rate of 92 bpm.   Recent Labs: 05/30/2014: BUN 30*; Creatinine 1.0; Hemoglobin 14.0; Platelets 215; Potassium 4.4; Sodium 141; TSH 0.70    Lipid Panel    Component Value Date/Time   CHOL 191 12/04/2013   TRIG 165* 12/04/2013   HDL 58 12/04/2013   LDLCALC 100 12/04/2013      Wt Readings from Last 3 Encounters:  12/24/14 189 lb 6.4 oz (85.911 kg)  06/04/14 189 lb (85.73 kg)  12/10/13 185 lb (83.915 kg)      Other studies Reviewed: Additional studies/ records that were reviewed today include: Electronic medical record. The findings include no new data.    ASSESSMENT AND PLAN:  1. Chronic diastolic heart failure (HCC) No evidence of volume overload  2. Essential hypertension Controlled  3. Hypercholesterolemia Followed by primary care  4. Rapid resting heart rate Normal sinus rhythm/ectopic atrial rhythm at 92 bpm on 300 mg of metoprolol succinate per day.    Current medicines are reviewed at length with the patient today.  The patient has the following concerns regarding medicines: We discussed the dose of metoprolol as being high for someone her age. She has been on this stable dose for quite some time and there is no evidence by EKG of conduction abnormality. We have decided to leave therapy as it is..  The following changes/actions have been instituted:    No change in therapy  Call if weakness, sudden dizziness, syncope, or near-syncope.  Labs/ tests ordered today include:   Orders Placed This Encounter   Procedures  . EKG 12-Lead     Disposition:   FU with HS in 1 year  Signed, Sinclair Grooms, MD  12/24/2014 12:30 PM    [facility] [address], [place], [place]  [address] Phone: [phone]; Fax: [phone]

## 2014-12-27 ENCOUNTER — Ambulatory Visit: Payer: Medicare Other | Admitting: Interventional Cardiology

## 2015-01-01 ENCOUNTER — Encounter: Payer: Medicare Other | Admitting: Internal Medicine

## 2015-01-17 ENCOUNTER — Other Ambulatory Visit: Payer: Self-pay | Admitting: Internal Medicine

## 2015-01-21 ENCOUNTER — Other Ambulatory Visit: Payer: Self-pay | Admitting: Interventional Cardiology

## 2015-01-21 MED ORDER — METOPROLOL SUCCINATE ER 100 MG PO TB24: ORAL_TABLET | ORAL | Status: DC

## 2015-02-12 ENCOUNTER — Non-Acute Institutional Stay: Payer: Medicare Other | Admitting: Internal Medicine

## 2015-02-12 ENCOUNTER — Encounter: Payer: Self-pay | Admitting: Internal Medicine

## 2015-02-12 VITALS — BP 110/68 | HR 99 | Temp 97.7°F | Ht 67.0 in | Wt 189.0 lb

## 2015-02-12 DIAGNOSIS — M810 Age-related osteoporosis without current pathological fracture: Secondary | ICD-10-CM

## 2015-02-12 DIAGNOSIS — E559 Vitamin D deficiency, unspecified: Secondary | ICD-10-CM

## 2015-02-12 DIAGNOSIS — M79671 Pain in right foot: Secondary | ICD-10-CM | POA: Diagnosis not present

## 2015-02-12 MED ORDER — ALENDRONATE SODIUM 70 MG PO TABS: 70.0000 mg | ORAL_TABLET | ORAL | Status: DC

## 2015-02-12 NOTE — Patient Instructions (Addendum)
Ok to use advil until we get xray results.    RICE:  Rest, Ice, Compress, Elevate.

## 2015-02-12 NOTE — Progress Notes (Signed)
Patient ID: Kelsey Salinas, female   DOB: 12/24/1926, 82 y.o.   MRN: [id number]   Location: [facility] Provider: Hampton Cost L. Mariea Clonts, D.O., C.M.D.  Goals of Care: Advanced Directive information Does patient have an advance directive?: Yes, Type of Advance Directive: [place];Living will  Chief Complaint  Patient presents with  . Acute Visit    swollen right foot on and off for one week    HPI: Patient is a 82 y.o. female seen in the office today for an acute visit for swelling of her right foot on and off for one week.    Had right hip replaced.  Was getting achy feeling ipu and down her leg in thigh ans ome into calf.  Now has pain in foot.  She lives at the opposite end from her apt to her husband in memory care.  Does not drive lately b/c she is concerned about it.  Did not bother her when driving.  Does not hurt at night.  Only hurts when walks on it.  She has fallen arches.  Her daughter returns tomorrow and she wants her to come with her.    Has never taken fosamax.  Discussed starting this and she is agreeable.  She is already taking calcium and vitamin D.  Last bone density 2014.    Review of Systems:  Review of Systems  Constitutional: Negative for fever and chills.  Musculoskeletal: Negative for joint pain and falls.       Right lateral foot pain at arch area, walks with rollator walker  Skin: Negative for itching and rash.    Past Medical History  Diagnosis Date  . Hypothyroidism     as a teenager  . Colon polyp   . Insomnia   . Hypercholesterolemia   . Right rotator cuff tear   . Indigestion   . Diverticular stricture ([place]) 2009  . Hypertension   . Osteopenia     Actonel stopped in 2012 after 5years therapy  . Diverticulosis 10-30-2007  . 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 Allergy   . Chronic diastolic heart failure [facility]) 12/25/2012    October 2013 echocardiogram 1. There is moderate  concentric left ventricle hypertrophy. 2. Left ventricular ejection fraction estimated by 2D at 60-65 percent. 3. There were no regional wall motion abnormalities. 4. Mild mitral annular calcification. 5. Mild mitral valve regurgitation. 6. There is mild tricuspid regurgitation. 7. Mildly elevated estimated right ventricular systolic pressure. 8. Right ventricular systolic pressure estimated at 35-40 mm Hg. 9. Mild calcification of the aortic valve. 10. Trace aortic valve regurgitation. 11. Analysis of mitral valve inflow, pulmonary vein Doppler and tissue Doppler suggests grade I diastolic dysfunction without elevated left atrial pressure.   . Rapid resting heart rate [id number]    Uncertain cause. Possible ectopic atrial tachycardia. Date entered 12/25/12   . Hypertension, essential   . Edema 09/12/13    Past Surgical History  Procedure Laterality Date  . Total hip arthroplasty Right 03-04-08    Dr. Wynelle Link  . Cataract extraction, bilateral    . Orif wrist fracture    . Breast reduction surgery  1999  . Colonoscopy  2009    Dr. Redmond School    Allergies  Allergen Reactions  . Amlodipine Besylate Other (See Comments)    Edema  . Lisinopril Cough    Cough   . Sulfamethoxazole-Trimethoprim Other (See Comments)    Unknown reaction  Medication List       This list is accurate as of: 02/12/15  9:00 AM.  Always use your most recent med list.               Calcium-Vitamin D-Vitamin K 500-100-40 MG-UNT-MCG Chew  Chew 2 tablets by mouth daily.     cholecalciferol 1000 units tablet  Commonly known as:  VITAMIN D  Take 1,000 Units by mouth daily.     ciclopirox 0.77 % cream  Commonly known as:  LOPROX  Apply twice daily under breast     metoprolol succinate 100 MG 24 hr tablet  Commonly known as:  TOPROL-XL  TAKE 1&1/2 TABLETS TWICE DAILY.     omeprazole 20 MG capsule  Commonly known as:  PRILOSEC  Take 20 mg by mouth daily.     OVER THE COUNTER MEDICATION  Take 1 tablet  by mouth daily. Med Name: ACID REDUCER     simvastatin 20 MG tablet  Commonly known as:  ZOCOR  TAKE 1 TABLET ONCE DAILY.     valsartan-hydrochlorothiazide 320-25 MG tablet  Commonly known as:  DIOVAN-HCT  TAKE 1 TABLET EACH DAY.        Health Maintenance  Topic Date Due  . TETANUS/TDAP  09/02/1947  . ZOSTAVAX  09/01/1988  . PNA vac Low Risk Adult (2 of 2 - PPSV23) 06/04/2015  . INFLUENZA VACCINE  08/12/2015  . DEXA SCAN  Completed    Physical Exam: Filed Vitals:   02/12/15 0850  BP: 110/68  Pulse: 99  Temp: 97.7 F (36.5 C)  TempSrc: Oral  Height: 5\' 7"  (1.702 m)  Weight: 189 lb (85.73 kg)  SpO2: 93%   Body mass index is 29.59 kg/(m^2). Physical Exam  Constitutional: She appears well-developed and well-nourished. No distress.  Musculoskeletal: She exhibits edema and tenderness.  Right lateral foot near arch; painful on walking--limping, using rollator walks, has mild edema of ankle and foot, no erythema, no tenderness of ligaments  Skin: Skin is warm and dry. No rash noted. No erythema. No pallor.  Psychiatric: She has a normal mood and affect.    Labs reviewed: Basic Metabolic Panel:  Recent Labs  05/30/14  NA 141  K 4.4  BUN 30*  CREATININE 1.0  TSH 0.70   Liver Function Tests: No results for input(s): AST, ALT, ALKPHOS, BILITOT, PROT, ALBUMIN in the last 8760 hours. No results for input(s): LIPASE, AMYLASE in the last 8760 hours. No results for input(s): AMMONIA in the last 8760 hours. CBC:  Recent Labs  05/30/14  WBC 8.1  HGB 14.0  HCT 41  PLT 215   Lipid Panel: No results for input(s): CHOL, HDL, LDLCALC, TRIG, CHOLHDL, LDLDIRECT in the last 8760 hours. No results found for: HGBA1C  Reviewed prior bone density study from 2014 which showed osteoporosis of left hip, osteopenia at other locations.    Assessment/Plan 1. Acute foot pain, right - DG Foot Complete Right; Future -RICE -rule out stress fx -differential:  Stress fx, bone spur,  plantar fasciitis, tendonitis, fallen arch, shoewear inappropriate  2. Senile osteoporosis -cont ca and vit D -begin fosamax 70mg  weekly for this--reviewed proper way to take this -check bone density in 1 year (last 2014 so will check if this improves it or stabilizes it)  3. Vitamin D deficiency -cont vitamin D supplement  Labs/tests ordered:   Orders Placed This Encounter  Procedures  . DG Foot Complete Right    Standing Status: Future     Number  of Occurrences:      Standing Expiration Date: 04/11/2016    Order Specific Question:  Reason for Exam (SYMPTOM  OR DIAGNOSIS REQUIRED)    Answer:  right lateral foot pain for one week    Order Specific Question:  Preferred imaging location?    Answer:  [facility]    Next appt:  Keep regular visit, prn  Myrle Wanek L. Dewaine Morocho, D.O. [facility] [address],  [address] Cell Phone (Mon-Fri 8am-5pm):  [phone] On Call:  [phone] & follow prompts after 5pm & weekends Office Phone:  [phone] Office Fax:  [phone]

## 2015-02-14 ENCOUNTER — Ambulatory Visit
Admission: RE | Admit: 2015-02-14 | Discharge: 2015-02-14 | Disposition: A | Discharge status: Home / Self Care | Payer: Medicare Other | Source: Ambulatory Visit | Attending: Internal Medicine | Admitting: Internal Medicine

## 2015-02-14 ENCOUNTER — Telehealth: Payer: Self-pay | Admitting: *Deleted

## 2015-02-14 ENCOUNTER — Other Ambulatory Visit: Payer: Self-pay | Admitting: *Deleted

## 2015-02-14 DIAGNOSIS — M79671 Pain in right foot: Secondary | ICD-10-CM

## 2015-02-14 DIAGNOSIS — S92353A Displaced fracture of fifth metatarsal bone, unspecified foot, initial encounter for closed fracture: Secondary | ICD-10-CM | POA: Insufficient documentation

## 2015-02-14 DIAGNOSIS — M25571 Pain in right ankle and joints of right foot: Secondary | ICD-10-CM | POA: Diagnosis not present

## 2015-02-14 DIAGNOSIS — S92354A Nondisplaced fracture of fifth metatarsal bone, right foot, initial encounter for closed fracture: Secondary | ICD-10-CM | POA: Diagnosis not present

## 2015-02-14 DIAGNOSIS — S92351D Displaced fracture of fifth metatarsal bone, right foot, subsequent encounter for fracture with routine healing: Secondary | ICD-10-CM

## 2015-02-26 DIAGNOSIS — S92354D Nondisplaced fracture of fifth metatarsal bone, right foot, subsequent encounter for fracture with routine healing: Secondary | ICD-10-CM | POA: Diagnosis not present

## 2015-02-26 DIAGNOSIS — M25571 Pain in right ankle and joints of right foot: Secondary | ICD-10-CM | POA: Diagnosis not present

## 2015-02-26 DIAGNOSIS — M25474 Effusion, right foot: Secondary | ICD-10-CM | POA: Diagnosis not present

## 2015-03-19 ENCOUNTER — Encounter: Payer: Self-pay | Admitting: Internal Medicine

## 2015-03-19 ENCOUNTER — Non-Acute Institutional Stay: Payer: Medicare Other | Admitting: Internal Medicine

## 2015-03-19 VITALS — BP 120/62 | HR 109 | Temp 98.0°F | Ht 67.0 in | Wt 189.0 lb

## 2015-03-19 DIAGNOSIS — M84374D Stress fracture, right foot, subsequent encounter for fracture with routine healing: Secondary | ICD-10-CM | POA: Diagnosis not present

## 2015-03-19 DIAGNOSIS — J069 Acute upper respiratory infection, unspecified: Secondary | ICD-10-CM

## 2015-03-19 NOTE — Progress Notes (Signed)
Patient ID: Kelsey Salinas, female   DOB: [birth year], 82 y.o.   MRN: [id number]   Location: [facility] (12)  Provider: Geoffrey Hynes L. Mariea Clonts, D.O., C.M.D.  Code Status: DNR Goals of Care:  Advanced Directives 03/19/2015  Does patient have an advance directive? Yes  Type of Paramedic of [place];Living will  Copy of advanced directive(s) in chart? Yes   Chief Complaint  Patient presents with  . Acute Visit    cough x3 weeks    HPI: Patient is a 82 y.o. female seen today for an acute visit for a cough.  She had a cold 3 wks ago.  Has been coughing up clear phlegm only.  She is tired of coughing.  No fever, chills, does sleep through the night.  Still has rhinorrhea.  No chest tightness.  It's exhausting.  Was trying to tell a story and she couldn't finish it b/c of the coughing.  Has tried mucinex, but it hasn't worked very well.  Throat tickles and causes it.    Right foot.  No longer hurting.  Was told yesterday that she could try taking the boot off to drive.  She sees him again Monday.  Has had her husband coming back to their apt for visits due to this b/c she could not walk the long distances all the way to memory care with her rollator and the stress fracture.  Past Medical History  Diagnosis Date  . Hypothyroidism     as a teenager  . Colon polyp   . Insomnia   . Hypercholesterolemia   . Right rotator cuff tear   . Indigestion   . Diverticular stricture ([place]) 2009  . Hypertension   . Osteopenia     Actonel stopped in 2012 after 5years therapy  . Diverticulosis 10-30-2007  . 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 Allergy   . Chronic diastolic heart failure [facility]) 12/25/2012    October 2013 echocardiogram 1. There is moderate concentric left ventricle hypertrophy. 2. Left ventricular ejection fraction estimated by 2D at 60-65 percent. 3. There were no regional wall  motion abnormalities. 4. Mild mitral annular calcification. 5. Mild mitral valve regurgitation. 6. There is mild tricuspid regurgitation. 7. Mildly elevated estimated right ventricular systolic pressure. 8. Right ventricular systolic pressure estimated at 35-40 mm Hg. 9. Mild calcification of the aortic valve. 10. Trace aortic valve regurgitation. 11. Analysis of mitral valve inflow, pulmonary vein Doppler and tissue Doppler suggests grade I diastolic dysfunction without elevated left atrial pressure.   . Rapid resting heart rate [id number]    Uncertain cause. Possible ectopic atrial tachycardia. Date entered 12/25/12   . Hypertension, essential   . Edema 09/12/13    Past Surgical History  Procedure Laterality Date  . Total hip arthroplasty Right 03-04-08    Dr. Wynelle Link  . Cataract extraction, bilateral    . Orif wrist fracture    . Breast reduction surgery  1999  . Colonoscopy  2009    Dr. Redmond [facility]    Allergies  Allergen Reactions  . Amlodipine Besylate Other (See Comments)    Edema  . Lisinopril Cough    Cough   . Sulfamethoxazole-Trimethoprim Other (See Comments)    Unknown reaction       Medication List       This list is accurate as of: 03/19/15 10:07 AM.  Always use your most recent med  list.               alendronate 70 MG tablet  Commonly known as:  FOSAMAX  Take 1 tablet (70 mg total) by mouth every 7 (seven) days. Take with a full glass of water on an empty stomach.     Calcium-Vitamin D-Vitamin K 500-100-40 MG-UNT-MCG Chew  Chew 2 tablets by mouth daily.     cholecalciferol 1000 units tablet  Commonly known as:  VITAMIN D  Take 1,000 Units by mouth daily.     ciclopirox 0.77 % cream  Commonly known as:  LOPROX  Apply twice daily under breast     metoprolol succinate 100 MG 24 hr tablet  Commonly known as:  TOPROL-XL  TAKE 1&1/2 TABLETS TWICE DAILY.     omeprazole 20 MG capsule  Commonly known as:  PRILOSEC  Take 20 mg by mouth daily.     OVER THE  COUNTER MEDICATION  Take 1 tablet by mouth daily. Med Name: ACID REDUCER     simvastatin 20 MG tablet  Commonly known as:  ZOCOR  TAKE 1 TABLET ONCE DAILY.     valsartan-hydrochlorothiazide 320-25 MG tablet  Commonly known as:  DIOVAN-HCT  TAKE 1 TABLET EACH DAY.        Review of Systems:  Review of Systems  Constitutional: Negative for fever and chills.  HENT: Positive for congestion and rhinorrhea. Negative for sinus pressure, sneezing and sore throat.   Respiratory: Positive for cough. Negative for choking, chest tightness, shortness of breath and wheezing.   Cardiovascular: Negative for chest pain and leg swelling.  Musculoskeletal: Negative for myalgias.       Right foot stress fracture in boot  Neurological: Negative for headaches.    Health Maintenance  Topic Date Due  . TETANUS/TDAP  09/02/1947  . ZOSTAVAX  09/01/1988  . PNA vac Low Risk Adult (2 of 2 - PPSV23) 06/04/2015  . INFLUENZA VACCINE  08/12/2015  . DEXA SCAN  Completed    Physical Exam: Filed Vitals:   03/19/15 0931  BP: 120/62  Pulse: 109  Temp: 98 F (36.7 C)  TempSrc: Oral  Height: 5\' 7"  (1.702 m)  Weight: 189 lb (85.73 kg)  SpO2: 96%   Body mass index is 29.59 kg/(m^2). Physical Exam  Constitutional: She is oriented to person, place, and time. She appears well-developed and well-nourished. No distress.  HENT:  Head: Normocephalic and atraumatic.  PND  Neck: Neck supple.  Cardiovascular: Regular rhythm, normal heart sounds and intact distal pulses.   tachy  Pulmonary/Chest: Effort normal and breath sounds normal.  Dry cough  Musculoskeletal:  Walking with rollator walker for balance, has right foot boot in place (no longer painful on walking)  Neurological: She is alert and oriented to person, place, and time.  Skin: Skin is warm and dry.    Labs reviewed: Basic Metabolic Panel:  Recent Labs  05/30/14  NA 141  K 4.4  BUN 30*  CREATININE 1.0  TSH 0.70  CBC:  Recent Labs   05/30/14  WBC 8.1  HGB 14.0  HCT 41  PLT 215  Assessment/Plan 1. Upper respiratory infection -almost resolved, likely viral (afebrile, no myalgias now), only residual is somewhat dry cough and rhinorrhea/PND -requests something for cough--called in coricidin BP cough and chest congestion 2 tsp q 6 hrs prn cough--use until cough no longer bothersome -if this is ineffective, would try tessalon perles next  2. Stress fracture of right foot, with routine healing, subsequent encounter -cont  use of boot--has been cleared to drive w/o it by her specialist and sees him again coming up  Labs/tests ordered:  Keep labs and physical upcoming as scheduled  Next appt:  04/02/2015  Kelsey Salinas, D.O. [facility] [address], [place] [address] Cell Phone (Mon-Fri 8am-5pm):  [phone] On Call:  [phone] & follow prompts after 5pm & weekends Office Phone:  [phone] Office Fax:  [phone]

## 2015-03-19 NOTE — Patient Instructions (Signed)
Coricidin HBP chest congestion and cough

## 2015-03-24 DIAGNOSIS — M25571 Pain in right ankle and joints of right foot: Secondary | ICD-10-CM | POA: Diagnosis not present

## 2015-03-24 DIAGNOSIS — M25671 Stiffness of right ankle, not elsewhere classified: Secondary | ICD-10-CM | POA: Diagnosis not present

## 2015-03-24 DIAGNOSIS — M25871 Other specified joint disorders, right ankle and foot: Secondary | ICD-10-CM | POA: Diagnosis not present

## 2015-03-24 DIAGNOSIS — S92354D Nondisplaced fracture of fifth metatarsal bone, right foot, subsequent encounter for fracture with routine healing: Secondary | ICD-10-CM | POA: Diagnosis not present

## 2015-03-25 DIAGNOSIS — I10 Essential (primary) hypertension: Secondary | ICD-10-CM | POA: Diagnosis not present

## 2015-03-25 DIAGNOSIS — E7800 Pure hypercholesterolemia, unspecified: Secondary | ICD-10-CM | POA: Diagnosis not present

## 2015-03-25 DIAGNOSIS — E039 Hypothyroidism, unspecified: Secondary | ICD-10-CM | POA: Diagnosis not present

## 2015-03-25 LAB — CBC AND DIFFERENTIAL
HCT: 40 % (ref 36–46)
Hemoglobin: 13.3 g/dL (ref 12.0–16.0)
Platelets: 220 10*3/uL (ref 150–399)
WBC: 7.5 10^3/mL

## 2015-03-25 LAB — BASIC METABOLIC PANEL
BUN: 35 mg/dL — AB (ref 4–21)
Creatinine: 1.1 mg/dL (ref 0.5–1.1)
Glucose: 122 mg/dL
Potassium: 4 mmol/L (ref 3.4–5.3)
Sodium: 141 mmol/L (ref 137–147)

## 2015-03-25 LAB — HEPATIC FUNCTION PANEL
ALT: 13 U/L (ref 7–35)
AST: 17 U/L (ref 13–35)
Alkaline Phosphatase: 77 U/L (ref 25–125)
Bilirubin, Total: 0.6 mg/dL

## 2015-03-25 LAB — LIPID PANEL
Cholesterol: 170 mg/dL (ref 0–200)
HDL: 53 mg/dL (ref 35–70)
LDL Cholesterol: 100 mg/dL
Triglycerides: 149 mg/dL (ref 40–160)

## 2015-03-25 LAB — TSH: TSH: 0.61 u[IU]/mL (ref 0.41–5.90)

## 2015-04-02 ENCOUNTER — Encounter: Payer: Self-pay | Admitting: Internal Medicine

## 2015-04-02 ENCOUNTER — Non-Acute Institutional Stay: Payer: Medicare Other | Admitting: Internal Medicine

## 2015-04-02 VITALS — BP 110/68 | HR 95 | Temp 98.0°F | Ht 67.0 in | Wt 185.0 lb

## 2015-04-02 DIAGNOSIS — E559 Vitamin D deficiency, unspecified: Secondary | ICD-10-CM | POA: Diagnosis not present

## 2015-04-02 DIAGNOSIS — M810 Age-related osteoporosis without current pathological fracture: Secondary | ICD-10-CM

## 2015-04-02 DIAGNOSIS — I10 Essential (primary) hypertension: Secondary | ICD-10-CM

## 2015-04-02 DIAGNOSIS — E038 Other specified hypothyroidism: Secondary | ICD-10-CM | POA: Diagnosis not present

## 2015-04-02 DIAGNOSIS — M84374D Stress fracture, right foot, subsequent encounter for fracture with routine healing: Secondary | ICD-10-CM

## 2015-04-02 DIAGNOSIS — Z0000 Encounter for general adult medical examination without abnormal findings: Secondary | ICD-10-CM | POA: Diagnosis not present

## 2015-04-02 DIAGNOSIS — N183 Chronic kidney disease, stage 3 unspecified: Secondary | ICD-10-CM

## 2015-04-02 NOTE — Progress Notes (Signed)
Patient ID: Kelsey Salinas, female   DOB: [birth year], 81 y.o.   MRN: [id number] MMSE 30/30 failed clock

## 2015-04-02 NOTE — Progress Notes (Signed)
Patient ID: Kelsey Salinas, female   DOB: 1926/08/02, 82 y.o.   MRN: [id number]    Location:  [facility] (12) Provider: Yzabelle Calles L. Mariea Clonts, D.O., C.M.D.  Patient Care Team: Gayland Curry, DO as PCP - General (Geriatric Medicine) Belva Crome, MD as Consulting Physician (Cardiology) Gaynelle Arabian, MD as Consulting Physician (Orthopedic Surgery) Belva Crome, MD as Consulting Physician (Cardiology) Monna Fam, MD as Consulting Physician (Ophthalmology) Elsie Saas, MD as Consulting Physician (Orthopedic Surgery)  Extended Emergency Contact Information Primary Emergency Contact: Myrene Galas [place] Phone: [phone] Relation: Daughter  Code Status: DNR Goals of Care: Advanced Directive information Advanced Directives 04/02/2015  Does patient have an advance directive? Yes  Type of Paramedic of [place];Out of facility DNR (pink MOST or yellow form)  Copy of advanced directive(s) in chart? Yes  Pre-existing out of facility DNR order (yellow form or pink MOST form) Yellow form placed in chart (order not valid for inpatient use)     Chief Complaint  Patient presents with  . Annual Exam    wellness exam  . Medical Management of Chronic Issues    hypertension, cholesterol, insomina  . MMSE    30/30, failed clock     HPI: Patient is a 82 y.o. female seen in today for an annual wellness exam and med mgt of chronic diseases.    Is on a new boot that laces up for her right foot fracture.  It allows her to wear her shoe on top. It takes a long time to put on so she is in the old boot today.    Depression screen [facility] 2/9 04/02/2015 10/31/2013  Decreased Interest 0 0  Down, Depressed, Hopeless 0 1  PHQ - 2 Score 0 1    Fall Risk  04/02/2015 10/31/2013  Falls in the past year? No No   MMSE - Mini Mental State Exam 04/02/2015 12/10/2013  Orientation to time 5 5  Orientation  to Place 5 5  Registration 3 3  Attention/ Calculation 5 5  Recall 3 3  Language- name 2 objects 2 2  Language- repeat 1 1  Language- follow 3 step command 3 3  Language- read & follow direction 1 1  Write a sentence 1 1  Copy design 1 1  Total score 30 30  failed clock  Health Maintenance  Topic Date Due  . TETANUS/TDAP  09/02/1947  . ZOSTAVAX  09/01/1988  . PNA vac Low Risk Adult (2 of 2 - PPSV23) 06/04/2015  . INFLUENZA VACCINE  08/12/2015  . DEXA SCAN  Completed   Urinary incontinence?  No Functional Status Survey: Is the patient deaf or have difficulty hearing?: Yes Does the patient have difficulty seeing, even when wearing glasses/contacts?: No Does the patient have difficulty concentrating, remembering, or making decisions?: No Does the patient have difficulty walking or climbing stairs?: Yes Does the patient have difficulty dressing or bathing?: No Does the patient have difficulty doing errands alone such as visiting a doctor's office or shopping?: No Exercise?  Limited with right foot fracture at present Diet?  Eats [employer] food Vision Screening Comments: Dr. Pandora Leiter 09/2014 Dentition: no difficulty Pain:foot not very painful anymore  Past Medical History  Diagnosis Date  . Hypothyroidism     as a teenager  . Colon polyp   . Insomnia   . Hypercholesterolemia   . Right rotator cuff tear   . Indigestion   .  Diverticular stricture ([place]) 2009  . Hypertension   . Osteopenia     Actonel stopped in 2012 after 5years therapy  . Diverticulosis 10-30-2007  . 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 Allergy   . Chronic diastolic heart failure [facility]) 12/25/2012    October 2013 echocardiogram 1. There is moderate concentric left ventricle hypertrophy. 2. Left ventricular ejection fraction estimated by 2D at 60-65 percent. 3. There were no regional wall motion abnormalities. 4. Mild mitral annular calcification. 5. Mild mitral  valve regurgitation. 6. There is mild tricuspid regurgitation. 7. Mildly elevated estimated right ventricular systolic pressure. 8. Right ventricular systolic pressure estimated at 35-40 mm Hg. 9. Mild calcification of the aortic valve. 10. Trace aortic valve regurgitation. 11. Analysis of mitral valve inflow, pulmonary vein Doppler and tissue Doppler suggests grade I diastolic dysfunction without elevated left atrial pressure.   . Rapid resting heart rate [id number]    Uncertain cause. Possible ectopic atrial tachycardia. Date entered 12/25/12   . Hypertension, essential   . Edema 09/12/13    Past Surgical History  Procedure Laterality Date  . Total hip arthroplasty Right 03-04-08    Dr. Wynelle Link  . Cataract extraction, bilateral    . Orif wrist fracture    . Breast reduction surgery  1999  . Colonoscopy  2009    Dr. Redmond [facility]   Social History   Social History  . Marital Status: Married    Spouse Name: N/A  . Number of Children: N/A  . Years of Education: N/A   Occupational History  . retired travel Music therapist    Social History Main Topics  . Smoking status: Former Smoker    Quit date: 01/10/1989  . Smokeless tobacco: Never Used  . Alcohol Use: 0.6 oz/week    1 Glasses of wine per week     Comment: 1-2 drinks per week  . Drug Use: No  . Sexual Activity: No   Other Topics Concern  . Not on file   Social History Narrative   Lives at [facility] since 2010   Husband Ilona Sorrel married 1952 (husband in memory care unit at [employer])   Former smoker 1/2 PPD, for 30 years, stopped 01/10/1989   Alcohol 2 glasses of wine at night   Exercise: machines 3 times a week and daily walk   No POA/LW             Allergies  Allergen Reactions  . Amlodipine Besylate Other (See Comments)    Edema  . Lisinopril Cough    Cough   . Sulfamethoxazole-Trimethoprim Other (See Comments)    Unknown reaction       Medication List       This list is accurate as of: 04/02/15 11:59 PM.  Always  use your most recent med list.               alendronate 70 MG tablet  Commonly known as:  FOSAMAX  Take 1 tablet (70 mg total) by mouth every 7 (seven) days. Take with a full glass of water on an empty stomach.     cholecalciferol 1000 units tablet  Commonly known as:  VITAMIN D  Take 1,000 Units by mouth daily.     ciclopirox 0.77 % cream  Commonly known as:  LOPROX  Apply twice daily under breast     CITRACAL +D3 250-107-500 MG-MG-UNIT Chew  Generic drug:  Calcium-Phosphorus-Vitamin D  Chew 4 each by mouth daily.  metoprolol succinate 100 MG 24 hr tablet  Commonly known as:  TOPROL-XL  TAKE 1&1/2 TABLETS TWICE DAILY.     omeprazole 20 MG capsule  Commonly known as:  PRILOSEC  Take 20 mg by mouth daily.     OVER THE COUNTER MEDICATION  Take 1 tablet by mouth daily. Med Name: ACID REDUCER     simvastatin 20 MG tablet  Commonly known as:  ZOCOR  TAKE 1 TABLET ONCE DAILY.     valsartan-hydrochlorothiazide 320-25 MG tablet  Commonly known as:  DIOVAN-HCT  TAKE 1 TABLET EACH DAY.         Review of Systems:  Review of Systems  Constitutional: Negative for fever, chills and malaise/fatigue.  HENT: Positive for hearing loss. Negative for congestion.   Eyes: Negative for blurred vision.       Glasses  Respiratory: Negative for shortness of breath.   Cardiovascular: Negative for chest pain, palpitations and leg swelling.  Gastrointestinal: Negative for abdominal pain, blood in stool and melena.  Genitourinary: Negative for dysuria and hematuria.  Musculoskeletal: Positive for joint pain. Negative for falls.  Skin: Negative for rash.  Neurological: Negative for dizziness, loss of consciousness and weakness.       Usntady gait, uses rollator walker  Psychiatric/Behavioral: Negative for depression and memory loss.    Physical Exam: Filed Vitals:   04/02/15 0953  BP: 110/68  Pulse: 95  Temp: 98 F (36.7 C)  TempSrc: Oral  Height: 5\' 7"  (1.702 m)  Weight:  185 lb (83.915 kg)  SpO2: 95%   Body mass index is 28.97 kg/(m^2). Physical Exam  Constitutional: She is oriented to person, place, and time. She appears well-developed and well-nourished. No distress.  HENT:  Head: Normocephalic and atraumatic.  Right Ear: External ear normal.  Left Ear: External ear normal.  Nose: Nose normal.  Mouth/Throat: Oropharynx is clear and moist. No oropharyngeal exudate.  Eyes: Conjunctivae and EOM are normal. Pupils are equal, round, and reactive to light. Right eye exhibits no discharge. Left eye exhibits no discharge. No scleral icterus.  glasses  Neck: Normal range of motion. Neck supple. No JVD present.  Cardiovascular: Normal rate, regular rhythm, normal heart sounds and intact distal pulses.   Pulmonary/Chest: Effort normal and breath sounds normal. No respiratory distress.  Abdominal: Soft. Bowel sounds are normal. She exhibits no distension and no mass. There is no tenderness.  Musculoskeletal: Normal range of motion.  walking with rollator walker, boot on right foot  Lymphadenopathy:    She has no cervical adenopathy.  Neurological: She is alert and oriented to person, place, and time. No cranial nerve deficit.  Skin: Skin is warm and dry.  Psychiatric: She has a normal mood and affect.  Anxious today    Labs reviewed: Basic Metabolic Panel:  Recent Labs  05/30/14 03/25/15  NA 141 141  K 4.4 4.0  BUN 30* 35*  CREATININE 1.0 1.1  TSH 0.70 0.61   Liver Function Tests:  Recent Labs  03/25/15  AST 17  ALT 13  ALKPHOS 77   No results for input(s): LIPASE, AMYLASE in the last 8760 hours. No results for input(s): AMMONIA in the last 8760 hours. CBC:  Recent Labs  05/30/14 03/25/15  WBC 8.1 7.5  HGB 14.0 13.3  HCT 41 40  PLT 215 220   Lipid Panel:  Recent Labs  03/25/15  CHOL 170  HDL 53  LDLCALC 100  TRIG 149   No results found for: HGBA1C Assessment/Plan 1. Medicare  annual wellness visit, subsequent -see hpi  2.  Morbid obesity due to excess calories ([place]) -encouraged exercise and watching sweets and starchy foods to help with weight loss  3. Senile osteoporosis -cont ca with D, additional D and fosamax  4. Stress fracture of right foot, with routine healing, subsequent encounter Continues with use of boot--now one that ties up that allows more clothing options, but can't wait to be in a regular shoe  5. Vitamin D deficiency -cont ca wit D and additional D supplement, get some sunshine to hel pwtih this also  6. Other specified hypothyroidism -cont current synthroid--is taking properly  7. CKD (chronic kidney disease) stage 3, GFR 30-59 ml/min -has been stable,f/u before next visit  8. Essential hypertension -bp was even well controlled today and she was anxious and upset about a funeral she'd just been to -cont same meds and monitor  Labs/tests ordered:  Hba1c, bmp before due to obesity Next appt:  .6 mos for med mgt  Joan Herschberger L. Aneta Hendershott, D.O. [facility] [address], [place] [address] Cell Phone (Mon-Fri 8am-5pm):  [phone] On Call:  [phone] & follow prompts after 5pm & weekends Office Phone:  [phone] Office Fax:  [phone]

## 2015-04-07 ENCOUNTER — Other Ambulatory Visit: Payer: Self-pay | Admitting: Internal Medicine

## 2015-04-09 DIAGNOSIS — S92354A Nondisplaced fracture of fifth metatarsal bone, right foot, initial encounter for closed fracture: Secondary | ICD-10-CM | POA: Diagnosis not present

## 2015-04-09 DIAGNOSIS — R2689 Other abnormalities of gait and mobility: Secondary | ICD-10-CM | POA: Diagnosis not present

## 2015-04-09 DIAGNOSIS — M25671 Stiffness of right ankle, not elsewhere classified: Secondary | ICD-10-CM | POA: Diagnosis not present

## 2015-04-17 DIAGNOSIS — B351 Tinea unguium: Secondary | ICD-10-CM | POA: Diagnosis not present

## 2015-04-17 DIAGNOSIS — M25871 Other specified joint disorders, right ankle and foot: Secondary | ICD-10-CM | POA: Diagnosis not present

## 2015-04-17 DIAGNOSIS — M25671 Stiffness of right ankle, not elsewhere classified: Secondary | ICD-10-CM | POA: Diagnosis not present

## 2015-04-17 DIAGNOSIS — L84 Corns and callosities: Secondary | ICD-10-CM | POA: Diagnosis not present

## 2015-04-17 DIAGNOSIS — M6281 Muscle weakness (generalized): Secondary | ICD-10-CM | POA: Diagnosis not present

## 2015-04-17 DIAGNOSIS — S92354D Nondisplaced fracture of fifth metatarsal bone, right foot, subsequent encounter for fracture with routine healing: Secondary | ICD-10-CM | POA: Diagnosis not present

## 2015-04-26 ENCOUNTER — Encounter: Payer: Self-pay | Admitting: Internal Medicine

## 2015-05-07 ENCOUNTER — Encounter: Payer: Self-pay | Admitting: Internal Medicine

## 2015-05-07 ENCOUNTER — Non-Acute Institutional Stay: Payer: Medicare Other | Admitting: Internal Medicine

## 2015-05-07 VITALS — BP 128/70 | HR 92 | Temp 98.1°F | Wt 186.0 lb

## 2015-05-07 DIAGNOSIS — J302 Other seasonal allergic rhinitis: Secondary | ICD-10-CM

## 2015-05-07 DIAGNOSIS — M79601 Pain in right arm: Secondary | ICD-10-CM | POA: Diagnosis not present

## 2015-05-07 DIAGNOSIS — S92301S Fracture of unspecified metatarsal bone(s), right foot, sequela: Secondary | ICD-10-CM

## 2015-05-07 DIAGNOSIS — R059 Cough, unspecified: Secondary | ICD-10-CM

## 2015-05-07 DIAGNOSIS — M79604 Pain in right leg: Secondary | ICD-10-CM

## 2015-05-07 DIAGNOSIS — R05 Cough: Secondary | ICD-10-CM

## 2015-05-07 MED ORDER — FEXOFENADINE HCL 180 MG PO TABS: 180.0000 mg | ORAL_TABLET | Freq: Every day | ORAL | Status: DC

## 2015-05-07 MED ORDER — BENZONATATE 100 MG PO CAPS: 100.0000 mg | ORAL_CAPSULE | Freq: Two times a day (BID) | ORAL | Status: DC | PRN

## 2015-05-07 NOTE — Progress Notes (Signed)
Patient ID: Kelsey Salinas, female   DOB: [birth year], 82 y.o.   MRN: [id number]   Location: [facility] (12)  Provider: Story Vanvranken L. Mariea Clonts, D.O., C.M.D.  Code Status: DNR Goals of Care:  Advanced Directives 05/07/2015  Does patient have an advance directive? Yes  Type of Paramedic of [place];Living will  Copy of advanced directive(s) in chart? Yes  Pre-existing out of facility DNR order (yellow form or pink MOST form) -     Chief Complaint  Patient presents with  . Acute Visit    cough    HPI: Patient is a 82 y.o. female seen today for an acute visit for cough that is not resolving.  She is using otc cough syrup.  Still coughing, nose still running.  Was hoping the pollen would wash away with rain.  Otherwise does not feel bad.  Just tired of coughing.    Past Medical History  Diagnosis Date  . Hypothyroidism     as a teenager  . Colon polyp   . Insomnia   . Hypercholesterolemia   . Right rotator cuff tear   . Indigestion   . Diverticular stricture ([place]) 2009  . Hypertension   . Osteopenia     Actonel stopped in 2012 after 5years therapy  . Diverticulosis 10-30-2007  . 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 Allergy   . Chronic diastolic heart failure [facility]) 12/25/2012    October 2013 echocardiogram 1. There is moderate concentric left ventricle hypertrophy. 2. Left ventricular ejection fraction estimated by 2D at 60-65 percent. 3. There were no regional wall motion abnormalities. 4. Mild mitral annular calcification. 5. Mild mitral valve regurgitation. 6. There is mild tricuspid regurgitation. 7. Mildly elevated estimated right ventricular systolic pressure. 8. Right ventricular systolic pressure estimated at 35-40 mm Hg. 9. Mild calcification of the aortic valve. 10. Trace aortic valve regurgitation. 11. Analysis of mitral valve inflow, pulmonary vein Doppler and  tissue Doppler suggests grade I diastolic dysfunction without elevated left atrial pressure.   . Rapid resting heart rate [id number]    Uncertain cause. Possible ectopic atrial tachycardia. Date entered 12/25/12   . Hypertension, essential   . Edema 09/12/13    Past Surgical History  Procedure Laterality Date  . Total hip arthroplasty Right 03-04-08    Dr. Wynelle Link  . Cataract extraction, bilateral    . Orif wrist fracture    . Breast reduction surgery  1999  . Colonoscopy  2009    Dr. Redmond School    Allergies  Allergen Reactions  . Amlodipine Besylate Other (See Comments)    Edema  . Lisinopril Cough    Cough   . Sulfamethoxazole-Trimethoprim Other (See Comments)    Unknown reaction       Medication List       This list is accurate as of: 05/07/15  3:45 PM.  Always use your most recent med list.               alendronate 70 MG tablet  Commonly known as:  FOSAMAX  Take 1 tablet (70 mg total) by mouth every 7 (seven) days. Take with a full glass of water on an empty stomach.     cholecalciferol 1000 units tablet  Commonly known as:  VITAMIN D  Take 1,000 Units by mouth daily.     ciclopirox 0.77 % cream  Commonly known as:  LOPROX  Apply twice daily under breast     CITRACAL +D3 250-107-500 MG-MG-UNIT Chew  Generic drug:  Calcium-Phosphorus-Vitamin D  Chew 4 each by mouth daily.     metoprolol succinate 100 MG 24 hr tablet  Commonly known as:  TOPROL-XL  TAKE 1&1/2 TABLETS TWICE DAILY.     omeprazole 20 MG capsule  Commonly known as:  PRILOSEC  Take 20 mg by mouth daily.     OVER THE COUNTER MEDICATION  Take 1 tablet by mouth daily. Med Name: ACID REDUCER     simvastatin 20 MG tablet  Commonly known as:  ZOCOR  TAKE 1 TABLET ONCE DAILY.     valsartan-hydrochlorothiazide 320-25 MG tablet  Commonly known as:  DIOVAN-HCT  TAKE 1 TABLET EACH DAY.        Review of Systems:  Review of Systems  Constitutional: Negative for fever, chills, activity change,  appetite change and fatigue.  HENT: Positive for congestion, postnasal drip and rhinorrhea. Negative for facial swelling, sinus pressure, sneezing, sore throat and tinnitus.   Eyes: Negative for discharge and itching.  Respiratory: Positive for cough. Negative for apnea, choking, chest tightness, shortness of breath, wheezing and stridor.   Cardiovascular: Negative for chest pain and palpitations.  Musculoskeletal: Positive for gait problem.       Still with some pain in her right leg where she had her stress fracture in her foot  Neurological: Negative for headaches.    Health Maintenance  Topic Date Due  . TETANUS/TDAP  09[birth year]02/1947  . ZOSTAVAX  09/01/1988  . PNA vac Low Risk Adult (2 of 2 - PPSV23) 06/04/2015  . INFLUENZA VACCINE  08/12/2015  . DEXA SCAN  Completed    Physical Exam: Filed Vitals:   05/07/15 1524  BP: 128/70  Pulse: 92  Temp: 98.1 F (36.7 C)  TempSrc: Oral  Weight: 186 lb (84.369 kg)  SpO2: 94%   Body mass index is 29.12 kg/(m^2). Physical Exam  Constitutional: She appears well-developed and well-nourished. No distress.  HENT:  Head: Normocephalic and atraumatic.  Right Ear: External ear normal.  Left Ear: External ear normal.  Clear rhinorrhea, postnasal drip present  Eyes: Conjunctivae are normal. Pupils are equal, round, and reactive to light.  Neck: Neck supple. No JVD present. No tracheal deviation present. No thyromegaly present.  Cardiovascular: Normal rate, regular rhythm and normal heart sounds.   Pulmonary/Chest: Effort normal and breath sounds normal. No respiratory distress.  Dry cough  Musculoskeletal:  Walks with rollator, no longer wearing any kind of boot or orthopedic shoe on her right foot; tender over lateral lower leg  Lymphadenopathy:    She has no cervical adenopathy.    Labs reviewed: Basic Metabolic Panel:  Recent Labs  05/30/14 03/25/15  NA 141 141  K 4.4 4.0  BUN 30* 35*  CREATININE 1.0 1.1  TSH 0.70 0.61   Liver  Function Tests:  Recent Labs  03/25/15  AST 17  ALT 13  ALKPHOS 77   No results for input(s): LIPASE, AMYLASE in the last 8760 hours. No results for input(s): AMMONIA in the last 8760 hours. CBC:  Recent Labs  05/30/14 03/25/15  WBC 8.1 7.5  HGB 14.0 13.3  HCT 41 40  PLT 215 220   Lipid Panel:  Recent Labs  03/25/15  CHOL 170  HDL 53  LDLCALC 100  TRIG 149   Assessment/Plan 1. Other seasonal allergic rhinitis - agrees to try allegra to dry up secretions during allergy season - fexofenadine (ALLEGRA) 180  MG tablet; Take 1 tablet (180 mg total) by mouth daily.  Dispense: 30 tablet; Refill: 1  2. Cough -due to #1, it seems -if persists with allegra, try tessalon perles to help with it; then consider changing her bp medication (?valsartan causing), but seems primarily due to the postnasal drip from her allergies  3. Fracture of 5th metatarsal, right, sequela -seems she has developed some new pain in her leg due to altered walking with her boot/shoe since her fracture  4. Leg pain, lateral, right -she wants to wait and see if it goes away since I think it's a result of her change in gait from her toe fracture -if not improving, would image the new area  Labs/tests ordered:  No new Next appt:  Keep 10/01/2015  Sarinity Dicicco L. Luvia Orzechowski, D.O. [facility] [address], [place] [address] Cell Phone (Mon-Fri 8am-5pm):  [phone] On Call:  [phone] & follow prompts after 5pm & weekends Office Phone:  [phone] Office Fax:  [phone]

## 2015-06-03 ENCOUNTER — Telehealth: Payer: Self-pay

## 2015-06-03 NOTE — Telephone Encounter (Signed)
Left message on voicemail for patient to return call when available   

## 2015-06-03 NOTE — Telephone Encounter (Signed)
Message left on triage voicemail: Patient called to c/o cough and runny nose x 1 month or longer (see 05-07-15 OV note). Patient would like to know if Dr.Reed can call her something in. (No available appointments at [facility] x 2 weeks or longer).  I called patient back, patient with productive cough for a little over 1 month (clear, no discoloration). Patient thinks the cough is a result of the nasal drainage. Patient denies fever, sinus pressure or facial pain or discomfort. Patient confirmed pharmacy on file as [facility]. Please advise

## 2015-06-03 NOTE — Telephone Encounter (Signed)
Patient returned call, patient took allegra for a couple of days then stopped. Patient took tessalon pearls for a couple of days then stopped. Patient will resume tessalon pearls bid and allegra daily as previously instructed and if no improvement in symptoms patient will call back.

## 2015-06-03 NOTE — Telephone Encounter (Signed)
She has already done the allegra and used the tessalon perles I believe.  (If not, she should follow through on those interventions.)  My concern is that this is due to her valsartan/hctz (diovan/hct).  I may need to change that--if she says she's done the other two items above, I will change her bp medication.

## 2015-06-18 ENCOUNTER — Non-Acute Institutional Stay: Payer: Medicare Other | Admitting: Internal Medicine

## 2015-06-18 ENCOUNTER — Encounter: Payer: Self-pay | Admitting: Internal Medicine

## 2015-06-18 VITALS — BP 130/70 | HR 96 | Temp 97.7°F | Wt 185.0 lb

## 2015-06-18 DIAGNOSIS — R05 Cough: Secondary | ICD-10-CM | POA: Diagnosis not present

## 2015-06-18 DIAGNOSIS — R058 Other specified cough: Secondary | ICD-10-CM

## 2015-06-18 MED ORDER — LORATADINE 10 MG PO TABS: 10.0000 mg | ORAL_TABLET | Freq: Every day | ORAL | Status: DC

## 2015-06-18 NOTE — Patient Instructions (Signed)
Stop allegra Start claritin If that does not work in 2 wks, call back and I can put you on singulair

## 2015-06-18 NOTE — Progress Notes (Signed)
Location: [facility] (12)  Provider: Eric Nees L. Mariea Clonts, D.O., C.M.D.  Code Status: DNR Goals of Care:  Advanced Directives 06/18/2015  Does patient have an advance directive? Yes  Type of Advance Directive Healthcare Power of Attorney  Copy of advanced directive(s) in chart? Yes     Chief Complaint  Patient presents with  . Acute Visit    constipation    HPI: Patient is a 82 y.o. female seen today for an acute visit for cough, constipation and difficulty getting into appts. 1.  Cough.  Now has runny nose that won't stop.  Using the allegra and the tessalon perles twice a day.  Clear drainage.  Finished abx from having a tooth worked on--had root canal.  Doesn't think the allegra is really helping.  She's better at the beach than here.     2.  Constipation:  Has been using prune juice which  Helps.  If she does not drink it, she is constipated.   3.  Has not been unable to get in with me two different weeks.  Of course, there have been no shows though.  Past Medical History  Diagnosis Date  . Hypothyroidism     as a teenager  . Colon polyp   . Insomnia   . Hypercholesterolemia   . Right rotator cuff tear   . Indigestion   . Diverticular stricture ([place]) 2009  . Hypertension   . Osteopenia     Actonel stopped in 2012 after 5years therapy  . Diverticulosis 10-30-2007  . 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 Allergy   . Chronic diastolic heart failure [facility]) 12/25/2012    October 2013 echocardiogram 1. There is moderate concentric left ventricle hypertrophy. 2. Left ventricular ejection fraction estimated by 2D at 60-65 percent. 3. There were no regional wall motion abnormalities. 4. Mild mitral annular calcification. 5. Mild mitral valve regurgitation. 6. There is mild tricuspid regurgitation. 7. Mildly elevated estimated right ventricular systolic pressure. 8. Right ventricular systolic  pressure estimated at 35-40 mm Hg. 9. Mild calcification of the aortic valve. 10. Trace aortic valve regurgitation. 11. Analysis of mitral valve inflow, pulmonary vein Doppler and tissue Doppler suggests grade I diastolic dysfunction without elevated left atrial pressure.   . Rapid resting heart rate [id number]    Uncertain cause. Possible ectopic atrial tachycardia. Date entered 12/25/12   . Hypertension, essential   . Edema 09/12/13    Past Surgical History  Procedure Laterality Date  . Total hip arthroplasty Right 03-04-08    Dr. Wynelle Link  . Cataract extraction, bilateral    . Orif wrist fracture    . Breast reduction surgery  1999  . Colonoscopy  2009    Dr. Redmond School    Allergies  Allergen Reactions  . Amlodipine Besylate Other (See Comments)    Edema  . Lisinopril Cough    Cough   . Sulfamethoxazole-Trimethoprim Other (See Comments)    Unknown reaction       Medication List       This list is accurate as of: 06/18/15  3:53 PM.  Always use your most recent med list.               alendronate 70 MG tablet  Commonly known as:  FOSAMAX  Take 1 tablet (70 mg total) by mouth every 7 (seven) days. Take with a full glass of water on an  empty stomach.     benzonatate 100 MG capsule  Commonly known as:  TESSALON  Take 1 capsule (100 mg total) by mouth 2 (two) times daily as needed for cough.     cholecalciferol 1000 units tablet  Commonly known as:  VITAMIN D  Take 1,000 Units by mouth daily.     ciclopirox 0.77 % cream  Commonly known as:  LOPROX  Apply twice daily under breast     CITRACAL +D3 250-107-500 MG-MG-UNIT Chew  Generic drug:  Calcium-Phosphorus-Vitamin D  Chew 4 each by mouth daily.     fexofenadine 180 MG tablet  Commonly known as:  ALLEGRA  Take 1 tablet (180 mg total) by mouth daily.     metoprolol succinate 100 MG 24 hr tablet  Commonly known as:  TOPROL-XL  TAKE 1&1/2 TABLETS TWICE DAILY.     omeprazole 20 MG capsule  Commonly known as:   PRILOSEC  Take 20 mg by mouth daily.     OVER THE COUNTER MEDICATION  Take 1 tablet by mouth daily. Med Name: ACID REDUCER     simvastatin 20 MG tablet  Commonly known as:  ZOCOR  TAKE 1 TABLET ONCE DAILY.     valsartan-hydrochlorothiazide 320-25 MG tablet  Commonly known as:  DIOVAN-HCT  TAKE 1 TABLET EACH DAY.       Review of Systems:  Review of Systems  Constitutional: Negative for fever, chills and malaise/fatigue.  Eyes: Negative for blurred vision.       Glasses  Respiratory: Positive for cough. Negative for sputum production, shortness of breath and wheezing.   Cardiovascular: Negative for chest pain.  Gastrointestinal: Positive for constipation. Negative for abdominal pain, diarrhea, blood in stool and melena.  Genitourinary: Negative for dysuria.  Musculoskeletal: Positive for joint pain. Negative for falls.  Skin: Negative for rash.  Neurological: Negative for weakness.  Psychiatric/Behavioral: Negative for depression and memory loss. The patient is nervous/anxious.     Health Maintenance  Topic Date Due  . TETANUS/TDAP  09[birth year]02[birth year]1947  . ZOSTAVAX  09/01/1988  . PNA vac Low Risk Adult (2 of 2 - PPSV23) 06/04/2015  . INFLUENZA VACCINE  08/12/2015  . DEXA SCAN  Completed    Physical Exam: Filed Vitals:   06/18/15 1546  BP: 130/70  Pulse: 96  Temp: 97.7 F (36.5 C)  TempSrc: Oral  Weight: 185 lb (83.915 kg)  SpO2: 92%   Body mass index is 28.97 kg/(m^2). Physical Exam  Constitutional: She is oriented to person, place, and time. She appears well-developed and well-nourished. No distress.  Cardiovascular: Normal rate, regular rhythm, normal heart sounds and intact distal pulses.   Pulmonary/Chest: Effort normal and breath sounds normal. No respiratory distress. She has no wheezes. She has no rales.  Abdominal: Soft. Bowel sounds are normal. She exhibits no distension. There is no tenderness.  Musculoskeletal: Normal range of motion.  Neurological: She is  alert and oriented to person, place, and time.  Skin: Skin is warm and dry.  Psychiatric: She has a normal mood and affect.    Labs reviewed: Basic Metabolic Panel:  Recent Labs  03/25/15  NA 141  K 4.0  BUN 35*  CREATININE 1.1  TSH 0.61   Liver Function Tests:  Recent Labs  03/25/15  AST 17  ALT 13  ALKPHOS 77   No results for input(s): LIPASE, AMYLASE in the last 8760 hours. No results for input(s): AMMONIA in the last 8760 hours. CBC:  Recent Labs  03/25/15  WBC 7.5  HGB 13.3  HCT 40  PLT 220   Lipid Panel:  Recent Labs  03/25/15  CHOL 170  HDL 53  LDLCALC 100  TRIG 149   Assessment/Plan 1. Upper airway cough syndrome - Stop allegra Start claritin If that does not work in 2 wks, call back and I can put you on singulair  - loratadine (CLARITIN) 10 MG tablet; Take 1 tablet (10 mg total) by mouth daily.  Dispense: 30 tablet; Refill: 11  Labs/tests ordered: No orders of the defined types were placed in this encounter.   Next appt: keep July appt  Kendrick Haapala L. Maneh Sieben, D.O. [facility] [address], [place] [address] Cell Phone (Mon-Fri 8am-5pm):  [phone] On Call:  [phone] & follow prompts after 5pm & weekends Office Phone:  [phone] Office Fax:  [phone]

## 2015-06-25 ENCOUNTER — Encounter: Payer: Medicare Other | Admitting: Internal Medicine

## 2015-07-21 ENCOUNTER — Other Ambulatory Visit: Payer: Self-pay | Admitting: Internal Medicine

## 2015-09-23 ENCOUNTER — Encounter: Payer: Self-pay | Admitting: Internal Medicine

## 2015-09-23 DIAGNOSIS — N183 Chronic kidney disease, stage 3 (moderate): Secondary | ICD-10-CM | POA: Diagnosis not present

## 2015-09-23 DIAGNOSIS — R739 Hyperglycemia, unspecified: Secondary | ICD-10-CM | POA: Diagnosis not present

## 2015-09-23 LAB — BASIC METABOLIC PANEL
BUN: 23 mg/dL — AB (ref 4–21)
Creatinine: 1.1 mg/dL (ref 0.5–1.1)
Glucose: 122 mg/dL
Potassium: 4.4 mmol/L (ref 3.4–5.3)
Sodium: 141 mmol/L (ref 137–147)

## 2015-09-23 LAB — HEMOGLOBIN A1C: Hemoglobin A1C: 6.8

## 2015-09-29 ENCOUNTER — Other Ambulatory Visit: Payer: Self-pay | Admitting: Internal Medicine

## 2015-10-01 ENCOUNTER — Encounter: Payer: Self-pay | Admitting: Internal Medicine

## 2015-10-01 ENCOUNTER — Non-Acute Institutional Stay: Payer: Medicare Other | Admitting: Internal Medicine

## 2015-10-01 VITALS — BP 110/70 | HR 95 | Temp 98.5°F | Ht 67.0 in | Wt 184.0 lb

## 2015-10-01 DIAGNOSIS — I5032 Chronic diastolic (congestive) heart failure: Secondary | ICD-10-CM | POA: Diagnosis not present

## 2015-10-01 DIAGNOSIS — E7800 Pure hypercholesterolemia, unspecified: Secondary | ICD-10-CM | POA: Diagnosis not present

## 2015-10-01 DIAGNOSIS — R739 Hyperglycemia, unspecified: Secondary | ICD-10-CM | POA: Diagnosis not present

## 2015-10-01 DIAGNOSIS — N183 Chronic kidney disease, stage 3 unspecified: Secondary | ICD-10-CM

## 2015-10-01 DIAGNOSIS — M1610 Unilateral primary osteoarthritis, unspecified hip: Secondary | ICD-10-CM | POA: Diagnosis not present

## 2015-10-01 DIAGNOSIS — I10 Essential (primary) hypertension: Secondary | ICD-10-CM

## 2015-10-01 NOTE — Progress Notes (Signed)
Location:  [facility]  Clinic (12)  Provider: Aryan Sparks L. Mariea Clonts, D.O., C.M.D.  Code Status: DNR Goals of Care:  Advanced Directives 10/01/2015  Does patient have an advance directive? Yes  Type of Paramedic of [place];Out of facility DNR (pink MOST or yellow form)  Does patient want to make changes to advanced directive? -  Copy of advanced directive(s) in chart? Yes  Would patient like information on creating an advanced directive? -  Pre-existing out of facility DNR order (yellow form or pink MOST form) Yellow form placed in chart (order not valid for inpatient use)     Chief Complaint  Patient presents with  . Medical Management of Chronic Issues    43mth follow-up    HPI: Patient is a 82 y.o. female seen today for medical management of chronic diseases.    Says she called three times to get into the office for coughing and they didn't notify her.    She says she does not have her cough right now.  She is feeling well.   Her right foot has healed.  Her edema is better.  Shoes feel looser.  She is back to walking over here and back (1000 steps).  No shortness of breath.    She is still constipated if she does not use prune juice, but the prune juice works.    Knits a lot and wrists sometimes will hurt a little--she takes a break and goes back.  Past Medical History:  Diagnosis Date  . Allergy   . Chronic diastolic heart failure [facility]) 12/25/2012   October 2013 echocardiogram 1. There is moderate concentric left ventricle hypertrophy. 2. Left ventricular ejection fraction estimated by 2D at 60-65 percent. 3. There were no regional wall motion abnormalities. 4. Mild mitral annular calcification. 5. Mild mitral valve regurgitation. 6. There is mild tricuspid regurgitation. 7. Mildly elevated estimated right ventricular systolic pressure. 8. Right ventricular systolic pressure estimated at 35-40 mm Hg. 9. Mild  calcification of the aortic valve. 10. Trace aortic valve regurgitation. 11. Analysis of mitral valve inflow, pulmonary vein Doppler and tissue Doppler suggests grade I diastolic dysfunction without elevated left atrial pressure.   . Colon polyp   . Diverticular stricture ([place]) 2009  . Diverticulosis 10-30-2007  . Edema 09/12/13  . Hypercholesterolemia   . Hypertension   . Hypertension, essential   . Hypothyroidism    as a teenager  . Indigestion   . Insomnia   . OA (osteoarthritis) of hip    right hip replacement, OA in bilateral knees  . Osteopenia    Actonel stopped in 2012 after 5years therapy  . Rapid resting heart rate 85/27/7824   Uncertain cause. Possible ectopic atrial tachycardia. Date entered 12/25/12   . Right rotator cuff tear     Past Surgical History:  Procedure Laterality Date  . BREAST REDUCTION SURGERY  1999  . CATARACT EXTRACTION, BILATERAL    . COLONOSCOPY  2009   Dr. Redmond School  . ORIF WRIST FRACTURE    . TOTAL HIP ARTHROPLASTY Right 03-04-08   Dr. Wynelle Link    Allergies  Allergen Reactions  . Amlodipine Besylate Other (See Comments)    Edema  . Lisinopril Cough    Cough   . Sulfamethoxazole-Trimethoprim Other (See Comments)    Unknown reaction       Medication List       Accurate as of 10/01/15  9:54 AM. Always use your most recent  med list.          alendronate 70 MG tablet Commonly known as:  FOSAMAX Take 1 tablet (70 mg total) by mouth every 7 (seven) days. Take with a full glass of water on an empty stomach.   cholecalciferol 1000 units tablet Commonly known as:  VITAMIN D Take 1,000 Units by mouth daily.   ciclopirox 0.77 % cream Commonly known as:  LOPROX Apply twice daily under breast   CITRACAL +D3 250-107-500 MG-MG-UNIT Chew Generic drug:  Calcium-Phosphorus-Vitamin D Chew 4 each by mouth daily.   loratadine 10 MG tablet Commonly known as:  CLARITIN Take 1 tablet (10 mg total) by mouth daily.   metoprolol succinate 100 MG  24 hr tablet Commonly known as:  TOPROL-XL TAKE 1&1/2 TABLETS TWICE DAILY.   omeprazole 20 MG capsule Commonly known as:  PRILOSEC Take 20 mg by mouth daily.   OVER THE COUNTER MEDICATION Take 1 tablet by mouth daily. Med Name: ACID REDUCER   simvastatin 20 MG tablet Commonly known as:  ZOCOR TAKE 1 TABLET ONCE DAILY.   valsartan-hydrochlorothiazide 320-25 MG tablet Commonly known as:  DIOVAN-HCT TAKE 1 TABLET EACH DAY.       Review of Systems:  Review of Systems  Constitutional: Negative for chills, fever and malaise/fatigue.  HENT: Negative for congestion.   Eyes: Negative for blurred vision.       Glasses  Respiratory: Negative for cough and shortness of breath.        Cough is seasonal  Cardiovascular: Negative for chest pain, palpitations and leg swelling.       Edema better  Gastrointestinal: Positive for constipation. Negative for abdominal pain, blood in stool, diarrhea, heartburn, melena, nausea and vomiting.  Genitourinary: Positive for urgency. Negative for dysuria and frequency.       Frequency better   Musculoskeletal: Positive for joint pain. Negative for falls and myalgias.       Knee pain  Skin: Negative for itching and rash.  Neurological: Negative for dizziness, loss of consciousness, weakness and headaches.  Endo/Heme/Allergies: Bruises/bleeds easily.  Psychiatric/Behavioral: Negative for depression, memory loss and suicidal ideas. The patient is nervous/anxious.     Health Maintenance  Topic Date Due  . TETANUS/TDAP  09/02/1945  . ZOSTAVAX  09/01/1988  . PNA vac Low Risk Adult (2 of 2 - PPSV23) 06/04/2015  . INFLUENZA VACCINE  08/12/2015  . DEXA SCAN  Completed    Physical Exam: Vitals:   10/01/15 0937  BP: 110/70  Pulse: 95  Temp: 98.5 F (36.9 C)  TempSrc: Oral  SpO2: 98%  Weight: 184 lb (83.5 kg)  Height: 5\' 7"  (1.702 m)   Body mass index is 28.82 kg/m. Physical Exam  Constitutional: She is oriented to person, place, and time.  She appears well-developed and well-nourished. No distress.  Cardiovascular: Normal rate, regular rhythm, normal heart sounds and intact distal pulses.   Edema of feet resolved  Pulmonary/Chest: Effort normal and breath sounds normal. No respiratory distress.  Abdominal: Soft. Bowel sounds are normal.  Musculoskeletal: Normal range of motion.  Walks with rollator walker due to knee pain  Neurological: She is alert and oriented to person, place, and time.  Skin: Skin is warm and dry.  Psychiatric: She has a normal mood and affect.    Labs reviewed: Basic Metabolic Panel:  Recent Labs  03/25/15 09/23/15 1041  NA 141 141  K 4.0 4.4  BUN 35* 23*  CREATININE 1.1 1.1  TSH 0.61  --  Liver Function Tests:  Recent Labs  03/25/15  AST 17  ALT 13  ALKPHOS 77   No results for input(s): LIPASE, AMYLASE in the last 8760 hours. No results for input(s): AMMONIA in the last 8760 hours. CBC:  Recent Labs  03/25/15  WBC 7.5  HGB 13.3  HCT 40  PLT 220   Lipid Panel:  Recent Labs  03/25/15  CHOL 170  HDL 53  LDLCALC 100  TRIG 149   Lab Results  Component Value Date   HGBA1C 6.8 09/23/2015    Assessment/Plan 1. Essential hypertension -bp well controlled on current regimen, cont same and monitor  2. Chronic diastolic heart failure (HCC) -edema of feet resolved, doing well, cont same meds  3. Primary osteoarthritis of hip, unspecified laterality -also knee, cont use of rollator walker, not using any regular medication for pain  4. Hypercholesterolemia -last lipids at goal 6 mos ago, cont zocor and recheck before AWV  5. CKD (chronic kidney disease) stage 3, GFR 30-59 ml/min -cr stable on labs this month, cont to avoid nephrotoxic agents and does reduce meds as appropriate, hydrate  6. Hyperglycemia -is bordering on diabetes -counseled to cut back on desserts or choose sugar free and walk more than 1000 steps more day  Labs/tests ordered:  Cbc, cmp, flp,  hba1c Next appt:  6 mos for AWV/CPE  Welden Hausmann L. [place], D.O. [facility] [address], [place] [address] Cell Phone (Mon-Fri 8am-5pm):  [phone] On Call:  [phone] & follow prompts after 5pm & weekends Office Phone:  [phone] Office Fax:  [phone]

## 2015-11-06 DIAGNOSIS — Z23 Encounter for immunization: Secondary | ICD-10-CM | POA: Diagnosis not present

## 2015-12-02 DIAGNOSIS — L570 Actinic keratosis: Secondary | ICD-10-CM | POA: Diagnosis not present

## 2015-12-02 DIAGNOSIS — L814 Other melanin hyperpigmentation: Secondary | ICD-10-CM | POA: Diagnosis not present

## 2015-12-02 DIAGNOSIS — D1801 Hemangioma of skin and subcutaneous tissue: Secondary | ICD-10-CM | POA: Diagnosis not present

## 2015-12-09 ENCOUNTER — Other Ambulatory Visit: Payer: Self-pay | Admitting: Interventional Cardiology

## 2016-01-01 DIAGNOSIS — M1711 Unilateral primary osteoarthritis, right knee: Secondary | ICD-10-CM | POA: Diagnosis not present

## 2016-01-01 DIAGNOSIS — M1712 Unilateral primary osteoarthritis, left knee: Secondary | ICD-10-CM | POA: Diagnosis not present

## 2016-01-01 DIAGNOSIS — M170 Bilateral primary osteoarthritis of knee: Secondary | ICD-10-CM | POA: Diagnosis not present

## 2016-01-15 ENCOUNTER — Other Ambulatory Visit: Payer: Self-pay | Admitting: Interventional Cardiology

## 2016-01-27 ENCOUNTER — Other Ambulatory Visit: Payer: Self-pay | Admitting: Internal Medicine

## 2016-01-27 DIAGNOSIS — L570 Actinic keratosis: Secondary | ICD-10-CM | POA: Diagnosis not present

## 2016-01-27 DIAGNOSIS — L853 Xerosis cutis: Secondary | ICD-10-CM | POA: Diagnosis not present

## 2016-01-27 DIAGNOSIS — B353 Tinea pedis: Secondary | ICD-10-CM | POA: Diagnosis not present

## 2016-02-15 ENCOUNTER — Other Ambulatory Visit: Payer: Self-pay | Admitting: Internal Medicine

## 2016-03-02 DIAGNOSIS — B353 Tinea pedis: Secondary | ICD-10-CM | POA: Diagnosis not present

## 2016-03-02 DIAGNOSIS — L814 Other melanin hyperpigmentation: Secondary | ICD-10-CM | POA: Diagnosis not present

## 2016-03-02 DIAGNOSIS — L570 Actinic keratosis: Secondary | ICD-10-CM | POA: Diagnosis not present

## 2016-03-11 ENCOUNTER — Encounter: Payer: Self-pay | Admitting: Interventional Cardiology

## 2016-03-17 ENCOUNTER — Ambulatory Visit: Payer: Medicare Other | Admitting: Interventional Cardiology

## 2016-03-17 ENCOUNTER — Other Ambulatory Visit: Payer: Self-pay | Admitting: Internal Medicine

## 2016-03-27 ENCOUNTER — Other Ambulatory Visit: Payer: Self-pay | Admitting: Internal Medicine

## 2016-03-30 ENCOUNTER — Encounter: Payer: Self-pay | Admitting: Internal Medicine

## 2016-03-30 DIAGNOSIS — E7800 Pure hypercholesterolemia, unspecified: Secondary | ICD-10-CM | POA: Diagnosis not present

## 2016-03-30 DIAGNOSIS — I5032 Chronic diastolic (congestive) heart failure: Secondary | ICD-10-CM | POA: Diagnosis not present

## 2016-03-30 DIAGNOSIS — E119 Type 2 diabetes mellitus without complications: Secondary | ICD-10-CM | POA: Diagnosis not present

## 2016-03-30 DIAGNOSIS — E785 Hyperlipidemia, unspecified: Secondary | ICD-10-CM | POA: Diagnosis not present

## 2016-03-30 DIAGNOSIS — I10 Essential (primary) hypertension: Secondary | ICD-10-CM | POA: Diagnosis not present

## 2016-03-30 DIAGNOSIS — R739 Hyperglycemia, unspecified: Secondary | ICD-10-CM | POA: Diagnosis not present

## 2016-03-30 DIAGNOSIS — D649 Anemia, unspecified: Secondary | ICD-10-CM | POA: Diagnosis not present

## 2016-03-30 DIAGNOSIS — N183 Chronic kidney disease, stage 3 (moderate): Secondary | ICD-10-CM | POA: Diagnosis not present

## 2016-03-30 LAB — LIPID PANEL
Cholesterol: 210 mg/dL — AB (ref 0–200)
HDL: 65 mg/dL (ref 35–70)
LDL Cholesterol: 111 mg/dL
Triglycerides: 172 mg/dL — AB (ref 40–160)

## 2016-03-30 LAB — CBC AND DIFFERENTIAL
HCT: 45 % (ref 36–46)
Hemoglobin: 14.4 g/dL (ref 12.0–16.0)
Platelets: 185 10*3/uL (ref 150–399)
WBC: 8.1 10^3/mL

## 2016-03-30 LAB — BASIC METABOLIC PANEL
BUN: 31 mg/dL — AB (ref 4–21)
Creatinine: 1.1 mg/dL (ref 0.5–1.1)
Glucose: 125 mg/dL
Potassium: 4.4 mmol/L (ref 3.4–5.3)
Sodium: 143 mmol/L (ref 137–147)

## 2016-03-30 LAB — HEPATIC FUNCTION PANEL
ALT: 11 U/L (ref 7–35)
AST: 15 U/L (ref 13–35)
Alkaline Phosphatase: 69 U/L (ref 25–125)
Bilirubin, Total: 0.6 mg/dL

## 2016-04-04 NOTE — Progress Notes (Signed)
Cardiology Office Note    Date:  04/05/2016   ID:  Kelsey Salinas, DOB 1926-07-16, MRN [id number]  PCP:  Hollace Kinnier, DO  Cardiologist: Sinclair Grooms, MD   Chief Complaint  Patient presents with  . Congestive Heart Failure    History of Present Illness:  Kelsey Salinas is a 83 y.o. female for chronic diastolic heart failure, tachycardia, and hypertension.  She is doing okay. She denies cardiac complaints. She is having some posterior neck discomfort. No swelling or orthopnea. No exertion-related chest or neck discomfort. She denies palpitations.  Past Medical History:  Diagnosis Date  . Allergy   . Chronic diastolic heart failure [facility]) 12/25/2012   October 2013 echocardiogram 1. There is moderate concentric left ventricle hypertrophy. 2. Left ventricular ejection fraction estimated by 2D at 60-65 percent. 3. There were no regional wall motion abnormalities. 4. Mild mitral annular calcification. 5. Mild mitral valve regurgitation. 6. There is mild tricuspid regurgitation. 7. Mildly elevated estimated right ventricular systolic pressure. 8. Right ventricular systolic pressure estimated at 35-40 mm Hg. 9. Mild calcification of the aortic valve. 10. Trace aortic valve regurgitation. 11. Analysis of mitral valve inflow, pulmonary vein Doppler and tissue Doppler suggests grade I diastolic dysfunction without elevated left atrial pressure.   . Colon polyp   . Diverticular stricture 2009  . Diverticulosis 10-30-2007  . Edema 09/12/13  . Hypercholesterolemia   . Hypertension   . Hypertension, essential   . Hypothyroidism    as a teenager  . Indigestion   . Insomnia   . OA (osteoarthritis) of hip    right hip replacement, OA in bilateral knees  . Osteopenia    Actonel stopped in 2012 after 5years therapy  . Rapid resting heart rate 78/29/5621   Uncertain cause. Possible ectopic atrial tachycardia. Date entered 12/25/12   . Right rotator cuff tear     Past Surgical History:    Procedure Laterality Date  . BREAST REDUCTION SURGERY  1999  . CATARACT EXTRACTION, BILATERAL    . COLONOSCOPY  2009   Dr. Redmond School  . ORIF WRIST FRACTURE    . TOTAL HIP ARTHROPLASTY Right 03-04-08   Dr. Wynelle Link    Current Medications: Outpatient Medications Prior to Visit  Medication Sig Dispense Refill  . alendronate (FOSAMAX) 70 MG tablet TAKE 1 TAB ONCE A WEEK, AT LEAST 30 MIN BEFORE 1ST FOOD.DO NOT LIE DOWN FOR 30 MIN AFTER TAKING. 4 tablet 1  . Calcium-Phosphorus-Vitamin D (CITRACAL +D3) [phone] MG-MG-UNIT CHEW Chew 4 each by mouth daily.    . metoprolol succinate (TOPROL-XL) 100 MG 24 hr tablet TAKE 1&1/2 TABLETS BY MOUTH TWICE DAILY. 90 tablet 1  . OVER THE COUNTER MEDICATION Take 1 tablet by mouth daily. Med Name: ACID REDUCER    . simvastatin (ZOCOR) 20 MG tablet TAKE 1 TABLET ONCE DAILY. 30 tablet 3  . valsartan-hydrochlorothiazide (DIOVAN-HCT) 320-25 MG tablet TAKE 1 TABLET EACH DAY. 90 tablet 0  . cholecalciferol (VITAMIN D) 1000 units tablet Take 1,000 Units by mouth daily.    . ciclopirox (LOPROX) 0.77 % cream Apply twice daily under breast  0  . loratadine (CLARITIN) 10 MG tablet Take 1 tablet (10 mg total) by mouth daily. (Patient not taking: Reported on 04/05/2016) 30 tablet 11  . omeprazole (PRILOSEC) 20 MG capsule Take 20 mg by mouth daily.     No facility-administered medications prior to visit.      Allergies:   Amlodipine besylate; Lisinopril; and Sulfamethoxazole-trimethoprim   Social History  Social History  . Marital status: Married    Spouse name: N/A  . Number of children: N/A  . Years of education: N/A   Occupational History  . retired travel Music therapist    Social History Main Topics  . Smoking status: Former Smoker    Quit date: 01/10/1989  . Smokeless tobacco: Never Used  . Alcohol use 0.6 oz/week    1 Glasses of wine per week     Comment: 1-2 drinks per week  . Drug use: No  . Sexual activity: No   Other Topics Concern  . None    Social History Narrative   Lives at [facility] since 2010   Husband Ilona Sorrel married 1952 (husband in memory care unit at [facility])   Former smoker 1/2 PPD, for 30 years, stopped 01/10/1989   Alcohol 2 glasses of wine at night   Exercise: machines 3 times a week and daily walk   No POA/LW              Family History:  The patient's family history includes Heart disease in her brother, father, and mother.   ROS:   Please see the history of present illness.    None  All other systems reviewed and are negative.   PHYSICAL EXAM:   VS:  BP 104/68 (BP Location: Left Arm)   Pulse 93   Ht 5\' 8"  (1.727 m)   Wt 180 lb 9.6 oz (81.9 kg)   BMI 27.46 kg/m    GEN: Well nourished, well developed, in no acute distress  HEENT: normal  Neck: no JVD, carotid bruits, or masses Cardiac: RRR; no murmurs, rubs, or gallops,no edema  Respiratory:  clear to auscultation bilaterally, normal work of breathing GI: soft, nontender, nondistended, + BS MS: no deformity or atrophy  Skin: warm and dry, no rash Neuro:  Alert and Oriented x 3, Strength and sensation are intact Psych: euthymic mood, full affect  Wt Readings from Last 3 Encounters:  04/05/16 180 lb 9.6 oz (81.9 kg)  10/01/15 184 lb (83.5 kg)  06/18/15 185 lb (83.9 kg)      Studies/Labs Reviewed:   EKG:  EKG  Sinus rhythm at 93 bpm. Otherwise unremarkable.  Recent Labs: 09/23/2015: BUN 23; Creatinine 1.1; Potassium 4.4; Sodium 141   Lipid Panel    Component Value Date/Time   CHOL 170 03/25/2015   TRIG 149 03/25/2015   HDL 53 03/25/2015   LDLCALC 100 03/25/2015    Additional studies/ records that were reviewed today include:  No recent functional or imaging data    ASSESSMENT:    1. Chronic diastolic heart failure ([place])   2. Essential hypertension   3. Rapid resting heart rate   4. Hypercholesterolemia      PLAN:  In order of problems listed above:  1. No evidence of volume overload. Despite very high dose  beta blocker therapy heart rate is still relatively elevated. 2. Blood pressure may be getting to a point where downward adjustment in the intensity of valsartan therapy will be necessary. Repeated blood pressure 116/70 compared to the above. I cautioned that if she begins having lightheadedness or dizziness she needs to inform us. Medication adjustment at that point should include reducing intensity of valsartan HCT to 160/25 mg daily or 320/12.5 mg daily. No changes made today. 3. Continue high-dose beta blocker therapy. The increased heart rate is likely related to an ectopic atrial tachycardia although based upon EKG, it appears to be coming from the region of  the sinus node. 4. Followed by primary care.    Medication Adjustments/Labs and Tests Ordered: Current medicines are reviewed at length with the patient today.  Concerns regarding medicines are outlined above.  Medication changes, Labs and Tests ordered today are listed in the Patient Instructions below. Patient Instructions  Medication Instructions:  None  Labwork: None  Testing/Procedures: None  Follow-Up: Your physician wants you to follow-up in: 1 year with Dr. Tamala Julian. You will receive a reminder letter in the mail two months in advance. If you don't receive a letter, please call our office to schedule the follow-up appointment.   Any Other Special Instructions Will Be Listed Below (If Applicable).  If your blood pressure is consistently running less than 115/90, please contact our office.    If you need a refill on your cardiac medications before your next appointment, please call your pharmacy.      Signed, Sinclair Grooms, MD  04/05/2016 10:50 AM    [address], [place], [place]  [address] Phone: [phone]; Fax: [phone]

## 2016-04-05 ENCOUNTER — Ambulatory Visit (INDEPENDENT_AMBULATORY_CARE_PROVIDER_SITE_OTHER): Payer: Medicare Other | Admitting: Interventional Cardiology

## 2016-04-05 ENCOUNTER — Encounter: Payer: Self-pay | Admitting: Interventional Cardiology

## 2016-04-05 VITALS — BP 104/68 | HR 93 | Ht 68.0 in | Wt 180.6 lb

## 2016-04-05 DIAGNOSIS — E7800 Pure hypercholesterolemia, unspecified: Secondary | ICD-10-CM

## 2016-04-05 DIAGNOSIS — I10 Essential (primary) hypertension: Secondary | ICD-10-CM | POA: Diagnosis not present

## 2016-04-05 DIAGNOSIS — R000 Tachycardia, unspecified: Secondary | ICD-10-CM | POA: Diagnosis not present

## 2016-04-05 DIAGNOSIS — I5032 Chronic diastolic (congestive) heart failure: Secondary | ICD-10-CM

## 2016-04-05 NOTE — Patient Instructions (Signed)
Medication Instructions:  None  Labwork: None  Testing/Procedures: None  Follow-Up: Your physician wants you to follow-up in: 1 year with Dr. Tamala Julian. You will receive a reminder letter in the mail two months in advance. If you don't receive a letter, please call our office to schedule the follow-up appointment.   Any Other Special Instructions Will Be Listed Below (If Applicable).  If your blood pressure is consistently running less than 115/90, please contact our office.    If you need a refill on your cardiac medications before your next appointment, please call your pharmacy.

## 2016-04-06 ENCOUNTER — Other Ambulatory Visit: Payer: Self-pay | Admitting: Interventional Cardiology

## 2016-04-07 ENCOUNTER — Encounter: Payer: Self-pay | Admitting: Internal Medicine

## 2016-04-07 ENCOUNTER — Non-Acute Institutional Stay: Payer: Medicare Other | Admitting: Internal Medicine

## 2016-04-07 VITALS — BP 130/70 | HR 99 | Temp 98.4°F | Ht 67.0 in | Wt 181.0 lb

## 2016-04-07 DIAGNOSIS — E7800 Pure hypercholesterolemia, unspecified: Secondary | ICD-10-CM | POA: Diagnosis not present

## 2016-04-07 DIAGNOSIS — R739 Hyperglycemia, unspecified: Secondary | ICD-10-CM | POA: Diagnosis not present

## 2016-04-07 DIAGNOSIS — Z0000 Encounter for general adult medical examination without abnormal findings: Secondary | ICD-10-CM | POA: Diagnosis not present

## 2016-04-07 DIAGNOSIS — S46812A Strain of other muscles, fascia and tendons at shoulder and upper arm level, left arm, initial encounter: Secondary | ICD-10-CM | POA: Diagnosis not present

## 2016-04-07 DIAGNOSIS — N183 Chronic kidney disease, stage 3 unspecified: Secondary | ICD-10-CM

## 2016-04-07 DIAGNOSIS — M170 Bilateral primary osteoarthritis of knee: Secondary | ICD-10-CM | POA: Diagnosis not present

## 2016-04-07 DIAGNOSIS — M1610 Unilateral primary osteoarthritis, unspecified hip: Secondary | ICD-10-CM | POA: Diagnosis not present

## 2016-04-07 DIAGNOSIS — I952 Hypotension due to drugs: Secondary | ICD-10-CM

## 2016-04-07 NOTE — Progress Notes (Signed)
Location:  Occupational psychologist of Service:  Clinic (12) Provider: Analiya Porco L. Mariea Clonts, D.O., C.M.D.  Patient Care Team: Gayland Curry, DO as PCP - General (Geriatric Medicine) Belva Crome, MD as Consulting Physician (Cardiology) Gaynelle Arabian, MD as Consulting Physician (Orthopedic Surgery) Belva Crome, MD as Consulting Physician (Cardiology) Monna Fam, MD as Consulting Physician (Ophthalmology) Elsie Saas, MD as Consulting Physician (Orthopedic Surgery)  Extended Emergency Contact Information Primary Emergency Contact: Myrene Galas [place] Phone: [phone] Relation: Daughter  Code Status: DNR Goals of Care: Advanced Directive information Advanced Directives 04/07/2016  Does Patient Have a Medical Advance Directive? Yes  Type of Paramedic of [place];Living will  Does patient want to make changes to medical advance directive? -  Copy of [place] in Chart? Yes  Would patient like information on creating a medical advance directive? -  Pre-existing out of facility DNR order (yellow form or pink MOST form) -   Chief Complaint  Patient presents with  . Annual Exam    WELLNESS EXAM  . MMSE    30/30 passed clock    HPI: Patient is a 83 y.o. female seen in today for an annual wellness exam.    She c/o left trapezius pain and difficulty turning her neck.  No injury.  Has been sitting knitting and reading more lately.  She says the pain comes and goes, worse some days.  Heat helps but hasn't gotten rid of it.    Pt also notes she had some hypotension and dizziness that Dr. Tamala Julian was concerned about in his note.  Today, her bp is fine and she's not having any dizziness.  She was lightheaded yesterday and wound up putting her head in her hands and it went away.    She continues to use her rollator walker to walk due to hip pain and bilateral knee pain.  She feels steadier this  way.    She also continues to have a callus on her great toe.  She has been seeing Dr. Buelah Manis who prescribed some type of topical for it and she's used it for more than 2 weeks after the last appt, but she says it's not any better--advised she should f/u with her.  Depression screen [facility] 2/9 04/07/2016 10/01/2015 04/02/2015 10/31/2013  Decreased Interest 0 0 0 0  Down, Depressed, Hopeless 0 0 0 1  PHQ - 2 Score 0 0 0 1    Fall Risk  04/07/2016 10/01/2015 04/02/2015 10/31/2013  Falls in the past year? No No No No   MMSE - Mini Mental State Exam 04/07/2016 04/02/2015 12/10/2013  Orientation to time 5 5 5   Orientation to Place 5 5 5   Registration 3 3 3   Attention/ Calculation 5 5 5   Recall 3 3 3   Language- name 2 objects 2 2 2   Language- repeat 1 1 1   Language- follow 3 step command 3 3 3   Language- read & follow direction 1 1 1   Write a sentence 1 1 1   Copy design 1 1 1   Total score 30 30 30      Health Maintenance  Topic Date Due  . TETANUS/TDAP  09/02/1945  . PNA vac Low Risk Adult (2 of 2 - PPSV23) 06/04/2015  . INFLUENZA VACCINE  Completed  . DEXA SCAN  Completed    Functional Status Survey: Is the patient deaf or have difficulty hearing?: (P) No Does the patient have difficulty seeing,  even when wearing glasses/contacts?: (P) No Does the patient have difficulty concentrating, remembering, or making decisions?: (P) No Does the patient have difficulty walking or climbing stairs?: (P) Yes Does the patient have difficulty dressing or bathing?: (P) No Does the patient have difficulty doing errands alone such as visiting a doctor's office or shopping?: (P) No Current Exercise Habits: (P) Home exercise routine  Diet? regular No exam data present Hearing:  No problem Dentition: No difficulty lately Pain:  Walks with rollator walker due to knee pain  Past Medical History:  Diagnosis Date  . Allergy   . Chronic diastolic heart failure [facility]) 12/25/2012   October 2013  echocardiogram 1. There is moderate concentric left ventricle hypertrophy. 2. Left ventricular ejection fraction estimated by 2D at 60-65 percent. 3. There were no regional wall motion abnormalities. 4. Mild mitral annular calcification. 5. Mild mitral valve regurgitation. 6. There is mild tricuspid regurgitation. 7. Mildly elevated estimated right ventricular systolic pressure. 8. Right ventricular systolic pressure estimated at 35-40 mm Hg. 9. Mild calcification of the aortic valve. 10. Trace aortic valve regurgitation. 11. Analysis of mitral valve inflow, pulmonary vein Doppler and tissue Doppler suggests grade I diastolic dysfunction without elevated left atrial pressure.   . Colon polyp   . Diverticular stricture 2009  . Diverticulosis 10-30-2007  . Edema 09/12/13  . Hypercholesterolemia   . Hypertension   . Hypertension, essential   . Hypothyroidism    as a teenager  . Indigestion   . Insomnia   . OA (osteoarthritis) of hip    right hip replacement, OA in bilateral knees  . Osteopenia    Actonel stopped in 2012 after 5years therapy  . Rapid resting heart rate 85/88/5027   Uncertain cause. Possible ectopic atrial tachycardia. Date entered 12/25/12   . Right rotator cuff tear     Past Surgical History:  Procedure Laterality Date  . BREAST REDUCTION SURGERY  1999  . CATARACT EXTRACTION, BILATERAL    . COLONOSCOPY  2009   Dr. Redmond School  . ORIF WRIST FRACTURE    . TOTAL HIP ARTHROPLASTY Right 03-04-08   Dr. Wynelle Link    Family History  Problem Relation Age of Onset  . Heart disease Mother   . Heart disease Father   . Heart disease Brother     Social History   Social History  . Marital status: Married    Spouse name: N/A  . Number of children: N/A  . Years of education: N/A   Occupational History  . retired travel Music therapist    Social History Main Topics  . Smoking status: Former Smoker    Quit date: 01/10/1989  . Smokeless tobacco: Never Used  . Alcohol use 0.6 oz/week     1 Glasses of wine per week     Comment: 1-2 drinks per week  . Drug use: No  . Sexual activity: No   Other Topics Concern  . None   Social History Narrative   Lives at [facility] since 2010   Husband Ilona Sorrel married 1952 (husband in memory care unit at [facility])   Former smoker 1/2 PPD, for 30 years, stopped 01/10/1989   Alcohol 2 glasses of wine at night   Exercise: machines 3 times a week and daily walk   No POA/LW             reports that she quit smoking about 27 years ago. She has never used smokeless tobacco. She reports that she drinks about 0.6 oz of alcohol per  week . She reports that she does not use drugs.  Allergies as of 04/07/2016      Reactions   Amlodipine Besylate Other (See Comments)   Edema   Lisinopril Cough   Cough   Sulfamethoxazole-trimethoprim Other (See Comments)   Unknown reaction      Medication List       Accurate as of 04/07/16 11:12 AM. Always use your most recent med list.          alendronate 70 MG tablet Commonly known as:  FOSAMAX TAKE 1 TAB ONCE A WEEK, AT LEAST 30 MIN BEFORE 1ST FOOD.DO NOT LIE DOWN FOR 30 MIN AFTER TAKING.   CITRACAL +D3 [phone] MG-MG-UNIT Chew Generic drug:  Calcium-Phosphorus-Vitamin D Chew 4 each by mouth daily.   metoprolol succinate 100 MG 24 hr tablet Commonly known as:  TOPROL-XL TAKE 1&1/2 TABLETS BY MOUTH TWICE DAILY.   OVER THE COUNTER MEDICATION Take 1 tablet by mouth daily. Med Name: ACID REDUCER   simvastatin 20 MG tablet Commonly known as:  ZOCOR TAKE 1 TABLET ONCE DAILY.   valsartan-hydrochlorothiazide 320-25 MG tablet Commonly known as:  DIOVAN-HCT TAKE 1 TABLET EACH DAY.   VITAMIN D PO Take 1 tablet by mouth every 7 (seven) days.       Review of Systems:  Review of Systems  Constitutional: Negative for chills, fever and malaise/fatigue.  HENT: Negative for hearing loss.   Eyes: Negative for blurred vision.       Glasses  Respiratory: Negative for cough and shortness of  breath.   Cardiovascular: Negative for chest pain, palpitations and leg swelling.  Gastrointestinal: Positive for constipation. Negative for abdominal pain, blood in stool, diarrhea and melena.       Using prune juice  Genitourinary: Negative for dysuria, frequency and urgency.  Musculoskeletal: Positive for joint pain and myalgias. Negative for falls.  Skin: Negative for itching and rash.  Neurological: Negative for dizziness, loss of consciousness and weakness.  Endo/Heme/Allergies: Does not bruise/bleed easily.  Psychiatric/Behavioral: Negative for depression and memory loss. The patient is nervous/anxious. The patient does not have insomnia.     Physical Exam: Vitals:   04/07/16 1017  BP: 130/70  Pulse: 99  Temp: 98.4 F (36.9 C)  TempSrc: Oral  SpO2: 98%  Weight: 181 lb (82.1 kg)  Height: 5\' 7"  (1.702 m)   Body mass index is 28.35 kg/m. Physical Exam  Constitutional: She is oriented to person, place, and time. She appears well-developed and well-nourished. No distress.  HENT:  Head: Normocephalic and atraumatic.  Neck: Neck supple. No JVD present.  Cardiovascular: Normal rate, regular rhythm, normal heart sounds and intact distal pulses.   Pulmonary/Chest: Effort normal and breath sounds normal. No respiratory distress.  Abdominal: Soft. Bowel sounds are normal.  Musculoskeletal: Normal range of motion. She exhibits tenderness.  Of left trapezius, decreased ROM to right  Lymphadenopathy:    She has no cervical adenopathy.  Neurological: She is alert and oriented to person, place, and time.  Skin: Skin is warm and dry. Capillary refill takes less than 2 seconds.  Psychiatric: She has a normal mood and affect. Her behavior is normal. Judgment and thought content normal.    Labs reviewed: Basic Metabolic Panel:  Recent Labs  09/23/15 1041  NA 141  K 4.4  BUN 23*  CREATININE 1.1   Liver Function Tests: No results for input(s): AST, ALT, ALKPHOS, BILITOT, PROT,  ALBUMIN in the last 8760 hours. No results for input(s): LIPASE, AMYLASE in the  last 8760 hours. No results for input(s): AMMONIA in the last 8760 hours. CBC: No results for input(s): WBC, NEUTROABS, HGB, HCT, MCV, PLT in the last 8760 hours. Lipid Panel: No results for input(s): CHOL, HDL, LDLCALC, TRIG, CHOLHDL, LDLDIRECT in the last 8760 hours. Lab Results  Component Value Date   HGBA1C 6.8 09/23/2015   Assessment/Plan 1. Hypotension due to drugs -cont to monitor bps as today's was fine and she's asymptomatic with lightheadedness today -she will go to clinic nurse daily x 2 weeks at 10:30am  2. CKD (chronic kidney disease) stage 3, GFR 30-59 ml/min -cont to monitor renal function  3. Hypercholesterolemia -cont zocor, LDL did trend up on labs before this visit so will work on her diet and try to get back in the pool for water aerobics  4. Primary osteoarthritis of hip, unspecified laterality -ongoing, continue use of walker for balance and support  5. Bilateral primary osteoarthritis of knee -cont use of walker for support,, tylenol for pain  6. Trapezius strain, left, initial encounter -recommend that pt use heat and then get a massage done to help with this muscular pain (reports she didn't have this spot at her last massage)  7. Hyperglycemia -cont to monitor, still in diabetic range and has been advised to work on this better with cutting down on desserts and fried foods  8. Medicare annual wellness visit, subsequent -done today as above  Labs/tests ordered:  flp, hba1c before Next appt: 6 mos med mgt, labs before  Naydeen Speirs L. Hrishikesh Hoeg, D.O. [facility] [address], [place] [address] Cell Phone (Mon-Fri 8am-5pm):  [phone] On Call:  [phone] & follow prompts after 5pm & weekends Office Phone:  [phone] Office Fax:  [phone]

## 2016-04-13 ENCOUNTER — Telehealth: Payer: Self-pay | Admitting: Interventional Cardiology

## 2016-04-13 NOTE — Telephone Encounter (Signed)
Burnadette, clinic nurse at [facility] is calling to report that patient has an elevated pulse rate. She states that yesterday rate was 103 and today it was 113. Burnadette also states that patient is not symptomatic and BP is fine. She would like to know if she should be concerned. Please call at [phone]. Thanks.

## 2016-04-13 NOTE — Telephone Encounter (Signed)
No action if she feels okay

## 2016-04-13 NOTE — Telephone Encounter (Signed)
Spoke with Mliss Sax and she states pt's HR has been elevated slightly more than usual the last 2 days.  This morning BP 116/70, manual HR 113.  Yesterday BP was 110/70, manual HR 103.  Pt denies sx.  Mliss Sax states pt has been coughing and having trouble with her allergies.  Took Cepacol and Zyrtec this morning.  Advised I would send message to Dr. Tamala Julian for review and advisement.

## 2016-04-13 NOTE — Telephone Encounter (Signed)
Left message to call back. Will route to Dr. Tamala Julian.

## 2016-04-14 NOTE — Telephone Encounter (Signed)
Spoke with Maudie Mercury at [facility] and she asked that I call the pt.  BP today was 120/70, HR 110.  Spoke with pt and made her aware of Dr. Thompson Caul recommendations.  Pt feeling fine.  Does currently have laryngitis.  Advised pt to call if HR continues to rise and stays high.  Pt verbalized understanding and was in agreement with this plan.

## 2016-04-22 ENCOUNTER — Encounter: Payer: Self-pay | Admitting: Internal Medicine

## 2016-04-22 ENCOUNTER — Ambulatory Visit (INDEPENDENT_AMBULATORY_CARE_PROVIDER_SITE_OTHER): Payer: Medicare Other | Admitting: Internal Medicine

## 2016-04-22 VITALS — BP 120/80 | HR 105 | Temp 98.2°F | Wt 182.0 lb

## 2016-04-22 DIAGNOSIS — J042 Acute laryngotracheitis: Secondary | ICD-10-CM

## 2016-04-22 MED ORDER — PREDNISONE 10 MG (21) PO TBPK: ORAL_TABLET | ORAL | 0 refills | Status: DC

## 2016-04-22 NOTE — Progress Notes (Signed)
Location:  [facility] clinic Provider: Yatzary Merriweather L. Mariea Clonts, D.O., C.M.D.  Code Status: DNR Goals of Care:  Advanced Directives 04/22/2016  Does Patient Have a Medical Advance Directive? Yes  Type of Paramedic of [place];Living will  Does patient want to make changes to medical advance directive? -  Copy of [place] in Chart? Yes  Would patient like information on creating a medical advance directive? -  Pre-existing out of facility DNR order (yellow form or pink MOST form) -     Chief Complaint  Patient presents with  . Acute Visit    cough    HPI: Patient is a 83 y.o. female seen today for an acute visit for Hoarseness, cough for a few weeks now.  No fever, chills.  Coughing up yellow mucus and in nose.  Very fatigued and fed up with coughing and blowing her nose.  Same thing every year.  Says she has spasms of cough and usually responds to prednisone therapy.  Apparently either her cardiologist son or pharmacist daughter made this suggestion at some point?  Past Medical History:  Diagnosis Date  . Allergy   . Chronic diastolic heart failure [facility]) 12/25/2012   October 2013 echocardiogram 1. There is moderate concentric left ventricle hypertrophy. 2. Left ventricular ejection fraction estimated by 2D at 60-65 percent. 3. There were no regional wall motion abnormalities. 4. Mild mitral annular calcification. 5. Mild mitral valve regurgitation. 6. There is mild tricuspid regurgitation. 7. Mildly elevated estimated right ventricular systolic pressure. 8. Right ventricular systolic pressure estimated at 35-40 mm Hg. 9. Mild calcification of the aortic valve. 10. Trace aortic valve regurgitation. 11. Analysis of mitral valve inflow, pulmonary vein Doppler and tissue Doppler suggests grade I diastolic dysfunction without elevated left atrial pressure.   . Colon polyp   . Diverticular stricture 2009  . Diverticulosis 10-30-2007  . Edema 09/12/13  .  Hypercholesterolemia   . Hypertension   . Hypertension, essential   . Hypothyroidism    as a teenager  . Indigestion   . Insomnia   . OA (osteoarthritis) of hip    right hip replacement, OA in bilateral knees  . Osteopenia    Actonel stopped in 2012 after 5years therapy  . Rapid resting heart rate 62/69/4854   Uncertain cause. Possible ectopic atrial tachycardia. Date entered 12/25/12   . Right rotator cuff tear     Past Surgical History:  Procedure Laterality Date  . BREAST REDUCTION SURGERY  1999  . CATARACT EXTRACTION, BILATERAL    . COLONOSCOPY  2009   Dr. Redmond School  . ORIF WRIST FRACTURE    . TOTAL HIP ARTHROPLASTY Right 03-04-08   Dr. Wynelle Link    Allergies  Allergen Reactions  . Amlodipine Besylate Other (See Comments)    Edema  . Lisinopril Cough    Cough   . Sulfamethoxazole-Trimethoprim Other (See Comments)    Unknown reaction     Allergies as of 04/22/2016      Reactions   Amlodipine Besylate Other (See Comments)   Edema   Lisinopril Cough   Cough   Sulfamethoxazole-trimethoprim Other (See Comments)   Unknown reaction      Medication List       Accurate as of 04/22/16  4:25 PM. Always use your most recent med list.          alendronate 70 MG tablet Commonly known as:  FOSAMAX TAKE 1 TAB ONCE A WEEK, AT LEAST 30 MIN BEFORE 1ST FOOD.DO  NOT LIE DOWN FOR 30 MIN AFTER TAKING.   CITRACAL +D3 [phone] MG-MG-UNIT Chew Generic drug:  Calcium-Phosphorus-Vitamin D Chew 4 each by mouth daily.   metoprolol succinate 100 MG 24 hr tablet Commonly known as:  TOPROL-XL Take one and one-half tablets by mouth twice daily.   OVER THE COUNTER MEDICATION Take 1 tablet by mouth daily. Med Name: ACID REDUCER   simvastatin 20 MG tablet Commonly known as:  ZOCOR TAKE 1 TABLET ONCE DAILY.   valsartan-hydrochlorothiazide 320-25 MG tablet Commonly known as:  DIOVAN-HCT TAKE 1 TABLET EACH DAY.   VITAMIN D PO Take 1 tablet by mouth every 7 (seven) days.         Review of Systems:  Review of Systems  Constitutional: Negative for chills and fever.  Respiratory: Positive for cough. Negative for sputum production, shortness of breath and wheezing.   Cardiovascular: Negative for chest pain and palpitations.  Musculoskeletal: Positive for joint pain. Negative for falls.  Psychiatric/Behavioral: Negative for memory loss.    Health Maintenance  Topic Date Due  . TETANUS/TDAP  09/02/1945  . PNA vac Low Risk Adult (2 of 2 - PPSV23) 06/04/2015  . INFLUENZA VACCINE  08/11/2016  . DEXA SCAN  Completed    Physical Exam: Vitals:   04/22/16 1517  BP: 120/80  Pulse: (!) 105  Temp: 98.2 F (36.8 C)  TempSrc: Oral  SpO2: 96%  Weight: 182 lb (82.6 kg)   Body mass index is 28.51 kg/m. Physical Exam  Constitutional: She appears well-developed and well-nourished. No distress.  HENT:  Head: Normocephalic and atraumatic.  Right Ear: External ear normal.  Left Ear: External ear normal.  Nose: Nose normal.  Mouth/Throat: Oropharyngeal exudate present.  PND  Eyes: Conjunctivae are normal. Pupils are equal, round, and reactive to light.  Neck: Neck supple. No JVD present.  Cardiovascular: Normal rate, regular rhythm, normal heart sounds and intact distal pulses.   Pulmonary/Chest: Effort normal and breath sounds normal. No respiratory distress.  Dry cough, no wheezing or rhonchi  Abdominal: Bowel sounds are normal.  Lymphadenopathy:    She has no cervical adenopathy.  Skin: Skin is warm and dry.  Psychiatric: She has a normal mood and affect.    Labs reviewed: Basic Metabolic Panel:  Recent Labs  09/23/15 1041 03/30/16  NA 141 143  K 4.4 4.4  BUN 23* 31*  CREATININE 1.1 1.1   Liver Function Tests:  Recent Labs  03/30/16  AST 15  ALT 11  ALKPHOS 69   No results for input(s): LIPASE, AMYLASE in the last 8760 hours. No results for input(s): AMMONIA in the last 8760 hours. CBC:  Recent Labs  03/30/16  WBC 8.1  HGB 14.4   HCT 45  PLT 185   Lipid Panel:  Recent Labs  03/30/16  CHOL 210*  HDL 65  LDLCALC 111  TRIG 172*   Lab Results  Component Value Date   HGBA1C 6.8 09/23/2015    Assessment/Plan 1. Acute laryngitis and tracheitis - predniSONE (STERAPRED UNI-PAK 21 TAB) 10 MG (21) TBPK tablet; Take as directed  Dispense: 21 tablet; Refill: 0 -push fluids -rest -call back if no better -cont otc robitussin  Labs/tests ordered:  No orders of the defined types were placed in this encounter.  Next appt:  10/06/2016  Rogena Deupree L. Dnaiel Voller, D.O. [facility] [address], [place] [address] Cell Phone (Mon-Fri 8am-5pm):  [phone] On Call:  [phone] & follow prompts after 5pm &  weekends Office Phone:  [phone] Office Fax:  [phone]

## 2016-05-04 ENCOUNTER — Telehealth: Payer: Self-pay | Admitting: *Deleted

## 2016-05-04 DIAGNOSIS — R053 Chronic cough: Secondary | ICD-10-CM

## 2016-05-04 DIAGNOSIS — R05 Cough: Secondary | ICD-10-CM

## 2016-05-04 NOTE — Telephone Encounter (Signed)
Pt stopped by the front desk at [facility] and asking for antibiotic for the cough, I also spoke with bernadette and she's asking if pt can have a chest xray? Please advise

## 2016-05-05 NOTE — Telephone Encounter (Signed)
Pt will go to [facility] and get chest xray.

## 2016-05-06 ENCOUNTER — Ambulatory Visit
Admission: RE | Admit: 2016-05-06 | Discharge: 2016-05-06 | Disposition: A | Discharge status: Home / Self Care | Payer: Medicare Other | Source: Ambulatory Visit | Attending: Internal Medicine | Admitting: Internal Medicine

## 2016-05-06 DIAGNOSIS — R05 Cough: Secondary | ICD-10-CM | POA: Diagnosis not present

## 2016-05-06 NOTE — Telephone Encounter (Signed)
Left message on machine asking pt when she would be going to get her chest xray

## 2016-05-07 ENCOUNTER — Other Ambulatory Visit: Payer: Self-pay | Admitting: *Deleted

## 2016-05-07 MED ORDER — HYDROCOD POLST-CPM POLST ER 10-8 MG/5ML PO SUER: 5.0000 mL | Freq: Three times a day (TID) | ORAL | 0 refills | Status: DC | PRN

## 2016-05-07 MED ORDER — AMOXICILLIN-POT CLAVULANATE 875-125 MG PO TABS: 1.0000 | ORAL_TABLET | Freq: Two times a day (BID) | ORAL | 0 refills | Status: DC

## 2016-05-07 NOTE — Telephone Encounter (Signed)
RX for antibiotic sent electronically, spoke with pharmacist at [facility] and [facility] will require a hard script (patient will need to pick-up rx and deliver to phrmacy). Patient informed via voicemail message.

## 2016-05-07 NOTE — Telephone Encounter (Signed)
-----   Message from Gayland Curry, DO sent at 05/07/2016  4:00 PM EDT ----- Let's send in augmentin 875/125mg  po q12 for 10 days to her pharmacy.  She should eat yogurt while taking the abx.  Let's also call in or fax in manually tussionex 48ml po tid prn cough 131ml, no refills

## 2016-05-19 ENCOUNTER — Telehealth: Payer: Self-pay | Admitting: *Deleted

## 2016-05-19 MED ORDER — ATORVASTATIN CALCIUM 10 MG PO TABS: ORAL_TABLET | ORAL | 1 refills | Status: DC

## 2016-05-19 NOTE — Telephone Encounter (Signed)
Cindi Carbon, NP called and wanted to change patient Cholesterol medication to Atorvastatin 10 mg once daily and Discontinue Zocor with FLP before next appointment. Lab is already scheduled and ordered per last OV note on 04/07/16. NP stated that a few months ago the son wanted patient's cholesterol medication changed.   Medication list updated and Rx faxed to pharmacy. Patient notified and agreed.

## 2016-05-26 ENCOUNTER — Other Ambulatory Visit: Payer: Self-pay | Admitting: Internal Medicine

## 2016-06-01 DIAGNOSIS — I10 Essential (primary) hypertension: Secondary | ICD-10-CM | POA: Diagnosis not present

## 2016-06-01 DIAGNOSIS — D649 Anemia, unspecified: Secondary | ICD-10-CM | POA: Diagnosis not present

## 2016-06-01 LAB — BASIC METABOLIC PANEL
BUN: 22 mg/dL — AB (ref 4–21)
Creatinine: 1.1 mg/dL (ref 0.5–1.1)
Glucose: 123 mg/dL
Potassium: 4.3 mmol/L (ref 3.4–5.3)
Sodium: 142 mmol/L (ref 137–147)

## 2016-06-16 ENCOUNTER — Encounter: Payer: Self-pay | Admitting: Internal Medicine

## 2016-06-16 ENCOUNTER — Non-Acute Institutional Stay: Payer: Medicare Other | Admitting: Internal Medicine

## 2016-06-16 VITALS — BP 120/70 | HR 108 | Temp 97.9°F | Ht 67.0 in | Wt 183.0 lb

## 2016-06-16 DIAGNOSIS — E7800 Pure hypercholesterolemia, unspecified: Secondary | ICD-10-CM | POA: Diagnosis not present

## 2016-06-16 DIAGNOSIS — K5904 Chronic idiopathic constipation: Secondary | ICD-10-CM | POA: Diagnosis not present

## 2016-06-16 DIAGNOSIS — R739 Hyperglycemia, unspecified: Secondary | ICD-10-CM | POA: Diagnosis not present

## 2016-06-16 DIAGNOSIS — J301 Allergic rhinitis due to pollen: Secondary | ICD-10-CM | POA: Diagnosis not present

## 2016-06-16 DIAGNOSIS — M436 Torticollis: Secondary | ICD-10-CM

## 2016-06-16 DIAGNOSIS — N183 Chronic kidney disease, stage 3 unspecified: Secondary | ICD-10-CM

## 2016-06-16 DIAGNOSIS — M62838 Other muscle spasm: Secondary | ICD-10-CM

## 2016-06-16 MED ORDER — CETIRIZINE HCL 10 MG PO CAPS: 1.0000 | ORAL_CAPSULE | Freq: Every day | ORAL | 2 refills | Status: DC

## 2016-06-16 NOTE — Progress Notes (Signed)
Location:  [facility] clinic Provider: Merrin Mcvicker L. Mariea Clonts, D.O., C.M.D.  Code Status: DNR Goals of Care:  Advanced Directives 06/16/2016  Does Patient Have a Medical Advance Directive? Yes  Type of Paramedic of [place];Living will  Does patient want to make changes to medical advance directive? -  Copy of [place] in Chart? Yes  Would patient like information on creating a medical advance directive? -  Pre-existing out of facility DNR order (yellow form or pink MOST form) -     Chief Complaint  Patient presents with  . Acute Visit    neck pain, coughing, want A1c ordered    HPI: Patient is a 83 y.o. female seen today for an acute visit for several concerns above.  Still coughing some.  Wants it to go away.  Discussed postnasal drip from allergies causing this.  Says it does get better when she goes to the beach.  Agrees to try zyrtec.  Her son wants her hba1c rechecked.  Neck pain is on left side of neck.  Uses heat wrap which gives her some relief.  Had some massages which have not helped.  Discussed nsaids, but could affect bp and gi system, kidneys so will steer away.  Does a lot of knitting and reading.    Dependent on prune juice with her constipation.  Takes 4 x per week.  Takes if she did not have one the day before.  Gets two good bms from it when she does take it.    Past Medical History:  Diagnosis Date  . Allergy   . Chronic diastolic heart failure [facility]) 12/25/2012   October 2013 echocardiogram 1. There is moderate concentric left ventricle hypertrophy. 2. Left ventricular ejection fraction estimated by 2D at 60-65 percent. 3. There were no regional wall motion abnormalities. 4. Mild mitral annular calcification. 5. Mild mitral valve regurgitation. 6. There is mild tricuspid regurgitation. 7. Mildly elevated estimated right ventricular systolic pressure. 8. Right ventricular systolic pressure estimated at 35-40 mm Hg. 9. Mild  calcification of the aortic valve. 10. Trace aortic valve regurgitation. 11. Analysis of mitral valve inflow, pulmonary vein Doppler and tissue Doppler suggests grade I diastolic dysfunction without elevated left atrial pressure.   . Colon polyp   . Diverticular stricture ([place]) 2009  . Diverticulosis 10-30-2007  . Edema 09/12/13  . Hypercholesterolemia   . Hypertension   . Hypertension, essential   . Hypothyroidism    as a teenager  . Indigestion   . Insomnia   . OA (osteoarthritis) of hip    right hip replacement, OA in bilateral knees  . Osteopenia    Actonel stopped in 2012 after 5years therapy  . Rapid resting heart rate 38/18/2993   Uncertain cause. Possible ectopic atrial tachycardia. Date entered 12/25/12   . Right rotator cuff tear     Past Surgical History:  Procedure Laterality Date  . BREAST REDUCTION SURGERY  1999  . CATARACT EXTRACTION, BILATERAL    . COLONOSCOPY  2009   Dr. Redmond [facility]  . ORIF WRIST FRACTURE    . TOTAL HIP ARTHROPLASTY Right 03-04-08   Dr. Wynelle Link    Allergies  Allergen Reactions  . Amlodipine Besylate Other (See Comments)    Edema  . Lisinopril Cough    Cough   . Sulfamethoxazole-Trimethoprim Other (See Comments)    Unknown reaction     Allergies as of 06/16/2016      Reactions   Amlodipine Besylate Other (See Comments)  Edema   Lisinopril Cough   Cough   Sulfamethoxazole-trimethoprim Other (See Comments)   Unknown reaction      Medication List       Accurate as of 06/16/16  9:23 AM. Always use your most recent med list.          alendronate 70 MG tablet Commonly known as:  FOSAMAX TAKE 1 TAB ONCE A WEEK, AT LEAST 30 MIN BEFORE 1ST FOOD.DO NOT LIE DOWN FOR 30 MIN AFTER TAKING.   atorvastatin 10 MG tablet Commonly known as:  LIPITOR Take one tablet by mouth once daily in the evening to control cholesterol.   CITRACAL +D3 [phone] MG-MG-UNIT Chew Generic drug:  Calcium-Phosphorus-Vitamin D Chew 4 each by mouth daily.     metoprolol succinate 100 MG 24 hr tablet Commonly known as:  TOPROL-XL Take one and one-half tablets by mouth twice daily.   OVER THE COUNTER MEDICATION Take 1 tablet by mouth daily. Med Name: ACID REDUCER   valsartan-hydrochlorothiazide 320-25 MG tablet Commonly known as:  DIOVAN-HCT TAKE 1 TABLET EACH DAY.   VITAMIN D PO Take 1 tablet by mouth every 7 (seven) days.       Review of Systems:  Review of Systems  Constitutional: Negative for chills, fever and malaise/fatigue.  HENT: Negative for congestion.        Postnasal drip  Eyes: Negative for blurred vision.       Glasses  Respiratory: Positive for cough. Negative for sputum production, shortness of breath and wheezing.   Cardiovascular: Negative for chest pain, palpitations and leg swelling.  Gastrointestinal: Negative for abdominal pain, blood in stool, constipation and melena.       Takes acid reducer otc  Genitourinary: Negative for dysuria.  Musculoskeletal: Positive for myalgias and neck pain. Negative for falls.  Neurological: Negative for dizziness, loss of consciousness and weakness.  Psychiatric/Behavioral: Negative for depression and memory loss.    Health Maintenance  Topic Date Due  . TETANUS/TDAP  09/02/1945  . PNA vac Low Risk Adult (2 of 2 - PPSV23) 06/04/2015  . INFLUENZA VACCINE  08/11/2016  . DEXA SCAN  Completed    Physical Exam: Vitals:   06/16/16 0844  BP: 120/70  Pulse: (!) 108  Temp: 97.9 F (36.6 C)  TempSrc: Oral  SpO2: 96%  Weight: 183 lb (83 kg)  Height: 5\' 7"  (1.702 m)   Body mass index is 28.66 kg/m. Physical Exam  Constitutional: She is oriented to person, place, and time. She appears well-developed and well-nourished. No distress.  Cardiovascular: Normal rate, regular rhythm, normal heart sounds and intact distal pulses.   Pulmonary/Chest: Effort normal and breath sounds normal.  Musculoskeletal: She exhibits tenderness.  Left side of neck with decreased ROM to the  left (rotation and side bending), tenderness of cervical paravertebral muscles into left trapezius, no obvious palpable hard spasm on exam; walks with rollator walker  Neurological: She is alert and oriented to person, place, and time.    Labs reviewed: Basic Metabolic Panel:  Recent Labs  09/23/15 1041 03/30/16 06/01/16  NA 141 143 142  K 4.4 4.4 4.3  BUN 23* 31* 22*  CREATININE 1.1 1.1 1.1   Liver Function Tests:  Recent Labs  03/30/16  AST 15  ALT 11  ALKPHOS 69   No results for input(s): LIPASE, AMYLASE in the last 8760 hours. No results for input(s): AMMONIA in the last 8760 hours. CBC:  Recent Labs  03/30/16  WBC 8.1  HGB 14.4  HCT 45  PLT 185   Lipid Panel:  Recent Labs  03/30/16  CHOL 210*  HDL 65  LDLCALC 111  TRIG 172*   Lab Results  Component Value Date   HGBA1C 6.8 09/23/2015    Assessment/Plan 1. Seasonal allergic rhinitis due to pollen - due to cough, cont acid reducer, resume zyrtec  - Cetirizine HCl (ZYRTEC ALLERGY) 10 MG CAPS; Take 1 capsule (10 mg total) by mouth daily.  Dispense: 30 capsule; Refill: 2  2. Hyperglycemia -discussed again increasing exercise, working on diet, recheck hba1c on tuesday  3. CKD (chronic kidney disease) stage 3, GFR 30-59 ml/min -avoid nsaids due to this and bp -cont to monitor and hydrate well  4. Hypercholesterolemia -cont statin therapy and monitor flp  5. Cervical paraspinal muscle spasm -refer to OT for tens/muscle energy/heat/US -cont heat at home -avoid long periods of one position like when knitting and reading and looking down   6. Torticollis -see #5  7. Chronic idiopathic constipation -cont prune juice, hydration, walking, it's working for her  Labs/tests ordered:  hba1c tuesday Next appt:  10/06/2016  Bobby Barton L. Bijal Siglin, D.O. [facility] [address], [place] [address] Cell Phone (Mon-Fri 8am-5pm):  [phone] On Call:   [phone] & follow prompts after 5pm & weekends Office Phone:  [phone] Office Fax:  [phone]

## 2016-06-21 DIAGNOSIS — M62838 Other muscle spasm: Secondary | ICD-10-CM | POA: Diagnosis not present

## 2016-06-21 DIAGNOSIS — M5383 Other specified dorsopathies, cervicothoracic region: Secondary | ICD-10-CM | POA: Diagnosis not present

## 2016-06-21 DIAGNOSIS — M436 Torticollis: Secondary | ICD-10-CM | POA: Diagnosis not present

## 2016-06-22 ENCOUNTER — Encounter: Payer: Self-pay | Admitting: Internal Medicine

## 2016-06-22 DIAGNOSIS — R739 Hyperglycemia, unspecified: Secondary | ICD-10-CM | POA: Diagnosis not present

## 2016-06-22 DIAGNOSIS — E119 Type 2 diabetes mellitus without complications: Secondary | ICD-10-CM | POA: Diagnosis not present

## 2016-06-23 DIAGNOSIS — M62838 Other muscle spasm: Secondary | ICD-10-CM | POA: Diagnosis not present

## 2016-06-23 DIAGNOSIS — M5383 Other specified dorsopathies, cervicothoracic region: Secondary | ICD-10-CM | POA: Diagnosis not present

## 2016-06-23 DIAGNOSIS — M436 Torticollis: Secondary | ICD-10-CM | POA: Diagnosis not present

## 2016-06-24 DIAGNOSIS — M436 Torticollis: Secondary | ICD-10-CM | POA: Diagnosis not present

## 2016-06-24 DIAGNOSIS — M62838 Other muscle spasm: Secondary | ICD-10-CM | POA: Diagnosis not present

## 2016-06-24 DIAGNOSIS — M5383 Other specified dorsopathies, cervicothoracic region: Secondary | ICD-10-CM | POA: Diagnosis not present

## 2016-06-28 ENCOUNTER — Telehealth: Payer: Self-pay | Admitting: *Deleted

## 2016-06-28 MED ORDER — METFORMIN HCL 500 MG PO TABS: 250.0000 mg | ORAL_TABLET | Freq: Every day | ORAL | 0 refills | Status: DC

## 2016-06-28 NOTE — Telephone Encounter (Signed)
Spoke with patient and advised results rx sent to pharmacy by e-script Gave pt information on how to eat correctly

## 2016-06-28 NOTE — Telephone Encounter (Signed)
.  left message to have patient return my call   Per Dr. Mariea Clonts " Now her A1c is up to treatment range, recommend she begin metformin 250 mg by mouth daily with her largest meal"

## 2016-07-01 DIAGNOSIS — M62838 Other muscle spasm: Secondary | ICD-10-CM | POA: Diagnosis not present

## 2016-07-01 DIAGNOSIS — M5383 Other specified dorsopathies, cervicothoracic region: Secondary | ICD-10-CM | POA: Diagnosis not present

## 2016-07-01 DIAGNOSIS — M436 Torticollis: Secondary | ICD-10-CM | POA: Diagnosis not present

## 2016-07-05 ENCOUNTER — Other Ambulatory Visit: Payer: Self-pay | Admitting: Internal Medicine

## 2016-07-06 DIAGNOSIS — M62838 Other muscle spasm: Secondary | ICD-10-CM | POA: Diagnosis not present

## 2016-07-06 DIAGNOSIS — M436 Torticollis: Secondary | ICD-10-CM | POA: Diagnosis not present

## 2016-07-06 DIAGNOSIS — M5383 Other specified dorsopathies, cervicothoracic region: Secondary | ICD-10-CM | POA: Diagnosis not present

## 2016-07-08 DIAGNOSIS — M62838 Other muscle spasm: Secondary | ICD-10-CM | POA: Diagnosis not present

## 2016-07-08 DIAGNOSIS — M436 Torticollis: Secondary | ICD-10-CM | POA: Diagnosis not present

## 2016-07-08 DIAGNOSIS — M5383 Other specified dorsopathies, cervicothoracic region: Secondary | ICD-10-CM | POA: Diagnosis not present

## 2016-07-22 DIAGNOSIS — M5383 Other specified dorsopathies, cervicothoracic region: Secondary | ICD-10-CM | POA: Diagnosis not present

## 2016-07-22 DIAGNOSIS — M62838 Other muscle spasm: Secondary | ICD-10-CM | POA: Diagnosis not present

## 2016-07-22 DIAGNOSIS — M436 Torticollis: Secondary | ICD-10-CM | POA: Diagnosis not present

## 2016-07-29 DIAGNOSIS — M436 Torticollis: Secondary | ICD-10-CM | POA: Diagnosis not present

## 2016-07-29 DIAGNOSIS — M5383 Other specified dorsopathies, cervicothoracic region: Secondary | ICD-10-CM | POA: Diagnosis not present

## 2016-07-29 DIAGNOSIS — M62838 Other muscle spasm: Secondary | ICD-10-CM | POA: Diagnosis not present

## 2016-08-03 DIAGNOSIS — M436 Torticollis: Secondary | ICD-10-CM | POA: Diagnosis not present

## 2016-08-03 DIAGNOSIS — M62838 Other muscle spasm: Secondary | ICD-10-CM | POA: Diagnosis not present

## 2016-08-03 DIAGNOSIS — M5383 Other specified dorsopathies, cervicothoracic region: Secondary | ICD-10-CM | POA: Diagnosis not present

## 2016-08-07 ENCOUNTER — Other Ambulatory Visit: Payer: Self-pay | Admitting: Internal Medicine

## 2016-08-10 DIAGNOSIS — M436 Torticollis: Secondary | ICD-10-CM | POA: Diagnosis not present

## 2016-08-10 DIAGNOSIS — M5383 Other specified dorsopathies, cervicothoracic region: Secondary | ICD-10-CM | POA: Diagnosis not present

## 2016-08-10 DIAGNOSIS — M62838 Other muscle spasm: Secondary | ICD-10-CM | POA: Diagnosis not present

## 2016-08-17 DIAGNOSIS — M5383 Other specified dorsopathies, cervicothoracic region: Secondary | ICD-10-CM | POA: Diagnosis not present

## 2016-08-17 DIAGNOSIS — M436 Torticollis: Secondary | ICD-10-CM | POA: Diagnosis not present

## 2016-08-17 DIAGNOSIS — M62838 Other muscle spasm: Secondary | ICD-10-CM | POA: Diagnosis not present

## 2016-08-19 DIAGNOSIS — M5383 Other specified dorsopathies, cervicothoracic region: Secondary | ICD-10-CM | POA: Diagnosis not present

## 2016-08-19 DIAGNOSIS — M436 Torticollis: Secondary | ICD-10-CM | POA: Diagnosis not present

## 2016-08-19 DIAGNOSIS — M62838 Other muscle spasm: Secondary | ICD-10-CM | POA: Diagnosis not present

## 2016-08-24 ENCOUNTER — Other Ambulatory Visit: Payer: Self-pay | Admitting: Internal Medicine

## 2016-08-24 ENCOUNTER — Telehealth: Payer: Self-pay | Admitting: *Deleted

## 2016-08-24 MED ORDER — LOSARTAN POTASSIUM-HCTZ 100-25 MG PO TABS: 1.0000 | ORAL_TABLET | Freq: Every day | ORAL | 3 refills | Status: DC

## 2016-08-24 NOTE — Telephone Encounter (Signed)
Changed to losartan/hctz and sent to [facility]

## 2016-08-24 NOTE — Progress Notes (Signed)
D/c diovan/hctz 320/50mg  due to recall of valsartan.  Start losartan/hctz 100/25mg  po daily.  Rx sent to [facility].

## 2016-08-24 NOTE — Telephone Encounter (Signed)
Received fax from [facility] [phone] requesting an alternative for Valsartan-HCTZ 320/25 (One daily) due to Recall. Pharmacy suggested Losartan HCTZ or Olmesartan HCTZ. Please Advise.

## 2016-08-31 DIAGNOSIS — D1801 Hemangioma of skin and subcutaneous tissue: Secondary | ICD-10-CM | POA: Diagnosis not present

## 2016-08-31 DIAGNOSIS — L304 Erythema intertrigo: Secondary | ICD-10-CM | POA: Diagnosis not present

## 2016-08-31 DIAGNOSIS — M62838 Other muscle spasm: Secondary | ICD-10-CM | POA: Diagnosis not present

## 2016-08-31 DIAGNOSIS — L814 Other melanin hyperpigmentation: Secondary | ICD-10-CM | POA: Diagnosis not present

## 2016-08-31 DIAGNOSIS — M5383 Other specified dorsopathies, cervicothoracic region: Secondary | ICD-10-CM | POA: Diagnosis not present

## 2016-08-31 DIAGNOSIS — M436 Torticollis: Secondary | ICD-10-CM | POA: Diagnosis not present

## 2016-09-02 DIAGNOSIS — M436 Torticollis: Secondary | ICD-10-CM | POA: Diagnosis not present

## 2016-09-02 DIAGNOSIS — M5383 Other specified dorsopathies, cervicothoracic region: Secondary | ICD-10-CM | POA: Diagnosis not present

## 2016-09-02 DIAGNOSIS — M62838 Other muscle spasm: Secondary | ICD-10-CM | POA: Diagnosis not present

## 2016-09-07 DIAGNOSIS — M436 Torticollis: Secondary | ICD-10-CM | POA: Diagnosis not present

## 2016-09-07 DIAGNOSIS — M5383 Other specified dorsopathies, cervicothoracic region: Secondary | ICD-10-CM | POA: Diagnosis not present

## 2016-09-07 DIAGNOSIS — M62838 Other muscle spasm: Secondary | ICD-10-CM | POA: Diagnosis not present

## 2016-10-06 ENCOUNTER — Encounter: Payer: Self-pay | Admitting: Internal Medicine

## 2016-10-25 DIAGNOSIS — H35033 Hypertensive retinopathy, bilateral: Secondary | ICD-10-CM | POA: Diagnosis not present

## 2016-10-25 DIAGNOSIS — H04123 Dry eye syndrome of bilateral lacrimal glands: Secondary | ICD-10-CM | POA: Diagnosis not present

## 2016-10-25 DIAGNOSIS — H40013 Open angle with borderline findings, low risk, bilateral: Secondary | ICD-10-CM | POA: Diagnosis not present

## 2016-10-25 DIAGNOSIS — Z961 Presence of intraocular lens: Secondary | ICD-10-CM | POA: Diagnosis not present

## 2016-10-26 DIAGNOSIS — R739 Hyperglycemia, unspecified: Secondary | ICD-10-CM | POA: Diagnosis not present

## 2016-10-26 DIAGNOSIS — E7800 Pure hypercholesterolemia, unspecified: Secondary | ICD-10-CM | POA: Diagnosis not present

## 2016-10-26 DIAGNOSIS — E785 Hyperlipidemia, unspecified: Secondary | ICD-10-CM | POA: Diagnosis not present

## 2016-10-26 DIAGNOSIS — E119 Type 2 diabetes mellitus without complications: Secondary | ICD-10-CM | POA: Diagnosis not present

## 2016-10-26 LAB — LIPID PANEL
Cholesterol: 167 (ref 0–200)
HDL: 61 (ref 35–70)
LDL Cholesterol: 79
Triglycerides: 138 (ref 40–160)

## 2016-10-26 LAB — HEMOGLOBIN A1C: Hemoglobin A1C: 5.7

## 2016-10-27 ENCOUNTER — Encounter: Payer: Self-pay | Admitting: Internal Medicine

## 2016-11-03 ENCOUNTER — Non-Acute Institutional Stay: Payer: Medicare Other | Admitting: Internal Medicine

## 2016-11-03 ENCOUNTER — Encounter: Payer: Self-pay | Admitting: Internal Medicine

## 2016-11-03 VITALS — BP 120/70 | HR 107 | Temp 97.7°F | Wt 178.0 lb

## 2016-11-03 DIAGNOSIS — R000 Tachycardia, unspecified: Secondary | ICD-10-CM | POA: Diagnosis not present

## 2016-11-03 DIAGNOSIS — E1122 Type 2 diabetes mellitus with diabetic chronic kidney disease: Secondary | ICD-10-CM

## 2016-11-03 DIAGNOSIS — R05 Cough: Secondary | ICD-10-CM | POA: Diagnosis not present

## 2016-11-03 DIAGNOSIS — M62838 Other muscle spasm: Secondary | ICD-10-CM | POA: Diagnosis not present

## 2016-11-03 DIAGNOSIS — M436 Torticollis: Secondary | ICD-10-CM

## 2016-11-03 DIAGNOSIS — N183 Chronic kidney disease, stage 3 unspecified: Secondary | ICD-10-CM

## 2016-11-03 DIAGNOSIS — I10 Essential (primary) hypertension: Secondary | ICD-10-CM | POA: Diagnosis not present

## 2016-11-03 DIAGNOSIS — K5904 Chronic idiopathic constipation: Secondary | ICD-10-CM | POA: Diagnosis not present

## 2016-11-03 DIAGNOSIS — E7800 Pure hypercholesterolemia, unspecified: Secondary | ICD-10-CM

## 2016-11-03 DIAGNOSIS — R053 Chronic cough: Secondary | ICD-10-CM

## 2016-11-03 NOTE — Progress Notes (Signed)
Location:  [facility] of Service:  Clinic (12)  Provider: Amir Fick L. Mariea Clonts, D.O., C.M.D.  Code Status: DNR Goals of Care:  Advanced Directives 06/16/2016  Does Patient Have a Medical Advance Directive? Yes  Type of Paramedic of [place];Living will  Does patient want to make changes to medical advance directive? -  Copy of [place] in Chart? Yes  Would patient like information on creating a medical advance directive? -  Pre-existing out of facility DNR order (yellow form or pink MOST form) -     Chief Complaint  Patient presents with  . Medical Management of Chronic Issues    23mth follow-up    HPI: Patient is a 83 y.o. female seen today for medical management of chronic diseases.    Cough was gone for a month after her gutters and things cleared out.    Derm:   Itching on her right hand--was told to use a lot of lotion which has not worked She has a spot under her right breast and was given ketoconozole for 2 wks--kept using and still there. Has a toe problem and using hydrocortisone on it and still there. She is going elsewhere.  Having more constipation lately.  Is relieved with prune juice, but has had to increase her frequency of prune juice to daily.  Was not going regularly w/o this.  Stools are normal for her.  No changes in stool appearance.  No blood or dark stool noted.  No abdominal pain.  Discussed that calcium and hctz could contribute.  She is pretty good on eating fruits and vegetables.    Diabetes:  Glucose avg has come down to 5.7 from 6.7.  She is being very cognicent of it.    Hyperlipidemia:  LDL has trended down as well.  79 from 111.    Neck is still giving her a fit.  She said she needs some therapy again.  Has stopped knitting, but still reads a lot.  She actually has a massage later today.  ROM remains very limited in her neck and it still hurts on the left shoulder and left  side of her neck.  Got flu shot Monday.  Past Medical History:  Diagnosis Date  . Allergy   . Chronic diastolic heart failure [facility]) 12/25/2012   October 2013 echocardiogram 1. There is moderate concentric left ventricle hypertrophy. 2. Left ventricular ejection fraction estimated by 2D at 60-65 percent. 3. There were no regional wall motion abnormalities. 4. Mild mitral annular calcification. 5. Mild mitral valve regurgitation. 6. There is mild tricuspid regurgitation. 7. Mildly elevated estimated right ventricular systolic pressure. 8. Right ventricular systolic pressure estimated at 35-40 mm Hg. 9. Mild calcification of the aortic valve. 10. Trace aortic valve regurgitation. 11. Analysis of mitral valve inflow, pulmonary vein Doppler and tissue Doppler suggests grade I diastolic dysfunction without elevated left atrial pressure.   . Colon polyp   . Diverticular stricture ([place]) 2009  . Diverticulosis 10-30-2007  . Edema 09/12/13  . Hypercholesterolemia   . Hypertension   . Hypertension, essential   . Hypothyroidism    as a teenager  . Indigestion   . Insomnia   . OA (osteoarthritis) of hip    right hip replacement, OA in bilateral knees  . Osteopenia    Actonel stopped in 2012 after 5years therapy  . Rapid resting heart rate 41/93/7902   Uncertain cause. Possible ectopic atrial tachycardia. Date entered 12/25/12   .  Right rotator cuff tear     Past Surgical History:  Procedure Laterality Date  . BREAST REDUCTION SURGERY  1999  . CATARACT EXTRACTION, BILATERAL    . COLONOSCOPY  2009   Dr. Redmond [facility]  . ORIF WRIST FRACTURE    . TOTAL HIP ARTHROPLASTY Right 03-04-08   Dr. Wynelle Link    Allergies  Allergen Reactions  . Amlodipine Besylate Other (See Comments)    Edema  . Lisinopril Cough    Cough   . Sulfamethoxazole-Trimethoprim Other (See Comments)    Unknown reaction     Outpatient Encounter Prescriptions as of 11/03/2016  Medication Sig  . alendronate (FOSAMAX) 70 MG  tablet TAKE 1 TAB ONCE A WEEK, AT LEAST 30 MIN BEFORE 1ST FOOD.DO NOT LIE DOWN FOR 30 MIN AFTER TAKING.  Marland Kitchen atorvastatin (LIPITOR) 10 MG tablet Take one tablet by mouth once daily in the evening to control cholesterol.  . Calcium-Phosphorus-Vitamin D (CITRACAL +D3) [phone] MG-MG-UNIT CHEW Chew 4 each by mouth daily.  . Cholecalciferol (VITAMIN D PO) Take 1 tablet by mouth every 7 (seven) days.  . hydrocortisone 2.5 % cream   . ketoconazole (NIZORAL) 2 % cream   . losartan-hydrochlorothiazide (HYZAAR) 100-25 MG tablet Take 1 tablet by mouth daily.  . metFORMIN (GLUCOPHAGE) 500 MG tablet TAKE (1/2) TABLET DAILY.  . metoprolol succinate (TOPROL-XL) 100 MG 24 hr tablet Take one and one-half tablets by mouth twice daily.  Marland Kitchen OVER THE COUNTER MEDICATION Take 1 tablet by mouth daily. Med Name: ACID REDUCER  . [DISCONTINUED] Cetirizine HCl (ZYRTEC ALLERGY) 10 MG CAPS Take 1 capsule (10 mg total) by mouth daily.   No facility-administered encounter medications on file as of 11/03/2016.     Review of Systems:  Review of Systems  Constitutional: Negative for chills, fever and malaise/fatigue.  HENT: Positive for hearing loss.   Eyes: Negative for blurred vision.       Glasses  Respiratory: Positive for cough. Negative for shortness of breath.        Much less cough  Cardiovascular: Negative for chest pain, palpitations and leg swelling.  Gastrointestinal: Positive for constipation. Negative for abdominal pain, blood in stool, diarrhea, heartburn, melena, nausea and vomiting.  Genitourinary: Negative for dysuria.  Musculoskeletal: Positive for joint pain. Negative for falls.  Skin: Positive for itching and rash.  Neurological: Negative for dizziness, loss of consciousness and weakness.  Endo/Heme/Allergies: Bruises/bleeds easily.  Psychiatric/Behavioral: Negative for depression and memory loss. The patient does not have insomnia.     Health Maintenance  Topic Date Due  . TETANUS/TDAP   09/02/1945  . PNA vac Low Risk Adult (2 of 2 - PPSV23) 06/04/2015  . INFLUENZA VACCINE  08/11/2016  . DEXA SCAN  Completed    Physical Exam: Vitals:   11/03/16 0909  BP: 120/70  Pulse: (!) 107  Temp: 97.7 F (36.5 C)  TempSrc: Oral  SpO2: 95%  Weight: 178 lb (80.7 kg)   Body mass index is 27.88 kg/m. Physical Exam  Constitutional: She is oriented to person, place, and time. She appears well-developed and well-nourished. No distress.  Cardiovascular: Regular rhythm, normal heart sounds and intact distal pulses.   tachycardic  Pulmonary/Chest: Effort normal and breath sounds normal.  Abdominal: Soft. Bowel sounds are normal. She exhibits no distension. There is no tenderness.  Musculoskeletal: Normal range of motion.  Walks with rollator walker due to hip; left trapezius and left paraspinal muscle spasms, torticollis with decreased rotation and sidebending to left, poor extension, but great flexion  of c spine  Neurological: She is alert and oriented to person, place, and time.  Skin: Skin is warm and dry.  Psychiatric: She has a normal mood and affect.    Labs reviewed: Basic Metabolic Panel:  Recent Labs  03/30/16 06/01/16  NA 143 142  K 4.4 4.3  BUN 31* 22*  CREATININE 1.1 1.1   Liver Function Tests:  Recent Labs  03/30/16  AST 15  ALT 11  ALKPHOS 69   No results for input(s): LIPASE, AMYLASE in the last 8760 hours. No results for input(s): AMMONIA in the last 8760 hours. CBC:  Recent Labs  03/30/16  WBC 8.1  HGB 14.4  HCT 45  PLT 185   Lipid Panel:  Recent Labs  03/30/16 10/26/16 0600  CHOL 210* 167  HDL 65 61  LDLCALC 111 79  TRIG 172* 138   Lab Results  Component Value Date   HGBA1C 5.7 10/26/2016    Assessment/Plan 1. Essential hypertension -bp at goal today, cont current regimen  2. Rapid resting heart rate -ongoing issue, per Dr. Thompson Caul notes, felt to be a MAT with ectopic node -remains on high dose of coreg but persists--she's  asymptomatic  3. Persistent dry cough -better now since she had her gutters and vents cleaned out  4. Trapezius muscle spasm 5. Cervical paraspinal muscle spasm 6. Torticollis -these three persist, getting massage today, but requests more therapeutic modalities with therapy for her neck pain   7. CKD (chronic kidney disease) stage 3, GFR 30-59 ml/min (HCC) -Avoid nephrotoxic agents like nsaids, dose adjust renally excreted meds, hydrate.  8. Type 2 diabetes mellitus with stage 3 chronic kidney disease, without long-term current use of insulin (HCC) -control improved to hyperglycemic range again since last visit -pt requested labs be provided to her son--asked memory care nurse to address when she was communicating about pt's husband anyway  60. Chronic idiopathic constipation -newly more problematic -advised to stop her calcium supplement, cont vitamin D  10.  Hyperlipidemia:  -much improved with dietary changes--continue these and current lipitor  Labs/tests ordered:no new Next appt:  4 mos med mgt  Robie Oats L. Harshan Kearley, D.O. [facility] [address], [place] [address] Cell Phone (Mon-Fri 8am-5pm):  [phone] On Call:  [phone] & follow prompts after 5pm & weekends Office Phone:  [phone] Office Fax:  [phone]

## 2016-11-03 NOTE — Patient Instructions (Addendum)
Stop your calcium supplement.  This may help your constipation.

## 2016-11-04 DIAGNOSIS — Z23 Encounter for immunization: Secondary | ICD-10-CM | POA: Diagnosis not present

## 2016-11-08 DIAGNOSIS — G243 Spasmodic torticollis: Secondary | ICD-10-CM | POA: Diagnosis not present

## 2016-11-08 DIAGNOSIS — M438X2 Other specified deforming dorsopathies, cervical region: Secondary | ICD-10-CM | POA: Diagnosis not present

## 2016-11-08 DIAGNOSIS — M542 Cervicalgia: Secondary | ICD-10-CM | POA: Diagnosis not present

## 2016-11-11 DIAGNOSIS — M542 Cervicalgia: Secondary | ICD-10-CM | POA: Diagnosis not present

## 2016-11-11 DIAGNOSIS — G243 Spasmodic torticollis: Secondary | ICD-10-CM | POA: Diagnosis not present

## 2016-11-11 DIAGNOSIS — M438X2 Other specified deforming dorsopathies, cervical region: Secondary | ICD-10-CM | POA: Diagnosis not present

## 2016-11-16 DIAGNOSIS — G243 Spasmodic torticollis: Secondary | ICD-10-CM | POA: Diagnosis not present

## 2016-11-16 DIAGNOSIS — M542 Cervicalgia: Secondary | ICD-10-CM | POA: Diagnosis not present

## 2016-11-16 DIAGNOSIS — M438X2 Other specified deforming dorsopathies, cervical region: Secondary | ICD-10-CM | POA: Diagnosis not present

## 2016-11-22 ENCOUNTER — Other Ambulatory Visit: Payer: Self-pay | Admitting: Internal Medicine

## 2016-11-23 DIAGNOSIS — M542 Cervicalgia: Secondary | ICD-10-CM | POA: Diagnosis not present

## 2016-11-23 DIAGNOSIS — G243 Spasmodic torticollis: Secondary | ICD-10-CM | POA: Diagnosis not present

## 2016-11-23 DIAGNOSIS — M438X2 Other specified deforming dorsopathies, cervical region: Secondary | ICD-10-CM | POA: Diagnosis not present

## 2016-11-25 DIAGNOSIS — M438X2 Other specified deforming dorsopathies, cervical region: Secondary | ICD-10-CM | POA: Diagnosis not present

## 2016-11-25 DIAGNOSIS — M542 Cervicalgia: Secondary | ICD-10-CM | POA: Diagnosis not present

## 2016-11-25 DIAGNOSIS — G243 Spasmodic torticollis: Secondary | ICD-10-CM | POA: Diagnosis not present

## 2016-11-29 DIAGNOSIS — G243 Spasmodic torticollis: Secondary | ICD-10-CM | POA: Diagnosis not present

## 2016-11-29 DIAGNOSIS — M438X2 Other specified deforming dorsopathies, cervical region: Secondary | ICD-10-CM | POA: Diagnosis not present

## 2016-11-29 DIAGNOSIS — M542 Cervicalgia: Secondary | ICD-10-CM | POA: Diagnosis not present

## 2016-11-30 DIAGNOSIS — G243 Spasmodic torticollis: Secondary | ICD-10-CM | POA: Diagnosis not present

## 2016-11-30 DIAGNOSIS — M542 Cervicalgia: Secondary | ICD-10-CM | POA: Diagnosis not present

## 2016-11-30 DIAGNOSIS — M438X2 Other specified deforming dorsopathies, cervical region: Secondary | ICD-10-CM | POA: Diagnosis not present

## 2016-12-01 ENCOUNTER — Non-Acute Institutional Stay: Payer: Medicare Other | Admitting: Internal Medicine

## 2016-12-01 ENCOUNTER — Encounter: Payer: Self-pay | Admitting: Internal Medicine

## 2016-12-01 VITALS — HR 95 | Wt 178.0 lb

## 2016-12-01 DIAGNOSIS — R05 Cough: Secondary | ICD-10-CM

## 2016-12-01 DIAGNOSIS — R053 Chronic cough: Secondary | ICD-10-CM

## 2016-12-01 NOTE — Progress Notes (Signed)
Location:  [place] of Service:  Clinic (12)  Provider: Teosha Casso L. Mariea Clonts, D.O., C.M.D.  Code Status: DNR Goals of Care:  Advanced Directives 06/16/2016  Does Patient Have a Medical Advance Directive? Yes  Type of Paramedic of [place];Living will  Does patient want to make changes to medical advance directive? -  Copy of [place] in Chart? Yes  Would patient like information on creating a medical advance directive? -  Pre-existing out of facility DNR order (yellow form or pink MOST form) -     Chief Complaint  Patient presents with  . Acute Visit    dry cough    HPI: Patient is a 83 y.o. female seen today for an acute visit for dry cough. She's actually had a cough for a couple of years and we've done all of the usual upper airway cough syndrome interventions--changing bp meds, gerd txs, allergy txs, anticholinergic drying agents, etc. And she's still coughing.  She is worried that her cough has gone into her lungs and wants me to check them.  She actually continues to say that the cough remains much better after she had some mold taken care of at her house and gutters cleaned.  She refused a full set of vitals.    Past Medical History:  Diagnosis Date  . Allergy   . Chronic diastolic heart failure [facility]) 12/25/2012   October 2013 echocardiogram 1. There is moderate concentric left ventricle hypertrophy. 2. Left ventricular ejection fraction estimated by 2D at 60-65 percent. 3. There were no regional wall motion abnormalities. 4. Mild mitral annular calcification. 5. Mild mitral valve regurgitation. 6. There is mild tricuspid regurgitation. 7. Mildly elevated estimated right ventricular systolic pressure. 8. Right ventricular systolic pressure estimated at 35-40 mm Hg. 9. Mild calcification of the aortic valve. 10. Trace aortic valve regurgitation. 11. Analysis of mitral valve inflow, pulmonary vein Doppler and tissue Doppler  suggests grade I diastolic dysfunction without elevated left atrial pressure.   . Colon polyp   . Diverticular stricture ([place]) 2009  . Diverticulosis 10-30-2007  . Edema 09/12/13  . Hypercholesterolemia   . Hypertension   . Hypertension, essential   . Hypothyroidism    as a teenager  . Indigestion   . Insomnia   . OA (osteoarthritis) of hip    right hip replacement, OA in bilateral knees  . Osteopenia    Actonel stopped in 2012 after 5years therapy  . Rapid resting heart rate 11/91/4782   Uncertain cause. Possible ectopic atrial tachycardia. Date entered 12/25/12   . Right rotator cuff tear     Past Surgical History:  Procedure Laterality Date  . BREAST REDUCTION SURGERY  1999  . CATARACT EXTRACTION, BILATERAL    . COLONOSCOPY  2009   Dr. Redmond [employer]  . ORIF WRIST FRACTURE    . TOTAL HIP ARTHROPLASTY Right 03-04-08   Dr. Wynelle Link    Allergies  Allergen Reactions  . Amlodipine Besylate Other (See Comments)    Edema  . Lisinopril Cough    Cough   . Sulfamethoxazole-Trimethoprim Other (See Comments)    Unknown reaction     Outpatient Encounter Medications as of 12/01/2016  Medication Sig  . alendronate (FOSAMAX) 70 MG tablet TAKE 1 TAB ONCE A WEEK, AT LEAST 30 MIN BEFORE 1ST FOOD.DO NOT LIE DOWN FOR 30 MIN AFTER TAKING.  Marland Kitchen atorvastatin (LIPITOR) 10 MG tablet TAKE 1 TABLET AT BEDTIME FOR CHOLESTEROL.  . Cholecalciferol (VITAMIN D PO)  Take 1 tablet by mouth every 7 (seven) days.  . hydrocortisone 2.5 % cream   . ketoconazole (NIZORAL) 2 % cream   . losartan-hydrochlorothiazide (HYZAAR) 100-25 MG tablet Take 1 tablet by mouth daily.  . metFORMIN (GLUCOPHAGE) 500 MG tablet TAKE (1/2) TABLET DAILY.  . metoprolol succinate (TOPROL-XL) 100 MG 24 hr tablet Take one and one-half tablets by mouth twice daily.  Marland Kitchen OVER THE COUNTER MEDICATION Take 1 tablet by mouth daily. Med Name: ACID REDUCER   No facility-administered encounter medications on file as of 12/01/2016.     Review of  Systems:  Review of Systems  Constitutional: Negative for chills, fever and malaise/fatigue.  HENT: Positive for congestion.   Eyes: Negative for blurred vision.       Glasses  Respiratory: Positive for cough. Negative for sputum production, shortness of breath and wheezing.   Cardiovascular: Negative for chest pain, palpitations and leg swelling.  Gastrointestinal: Positive for heartburn. Negative for abdominal pain.  Genitourinary: Negative for dysuria.  Musculoskeletal: Positive for myalgias and neck pain. Negative for falls.  Neurological: Negative for dizziness and weakness.  Psychiatric/Behavioral: Negative for memory loss. The patient is nervous/anxious.     Health Maintenance  Topic Date Due  . FOOT EXAM  09/02/1936  . OPHTHALMOLOGY EXAM  09/02/1936  . TETANUS/TDAP  [birth year]/1947  . PNA vac Low Risk Adult (2 of 2 - PPSV23) 06/04/2015  . HEMOGLOBIN A1C  04/26/2017  . INFLUENZA VACCINE  Completed  . DEXA SCAN  Completed    Physical Exam: Vitals:   12/01/16 1552  Pulse: 95  SpO2: 97%  Weight: 178 lb (80.7 kg)   Body mass index is 27.88 kg/m. Physical Exam  Constitutional: She is oriented to person, place, and time. She appears well-developed and well-nourished. No distress.  Cardiovascular: Normal rate, regular rhythm, normal heart sounds and intact distal pulses.  Pulmonary/Chest: Effort normal and breath sounds normal. No stridor. No respiratory distress. She has no wheezes. She has no rales.  Abdominal: Soft.  Musculoskeletal:  Ambulates with rollator walker due to hip pain  Neurological: She is alert and oriented to person, place, and time.  Skin: Skin is warm and dry.  Psychiatric: She has a normal mood and affect.    Labs reviewed: Basic Metabolic Panel: Recent Labs    03/30/16 06/01/16  NA 143 142  K 4.4 4.3  BUN 31* 22*  CREATININE 1.1 1.1   Liver Function Tests: Recent Labs    03/30/16  AST 15  ALT 11  ALKPHOS 69   No results for input(s):  LIPASE, AMYLASE in the last 8760 hours. No results for input(s): AMMONIA in the last 8760 hours. CBC: Recent Labs    03/30/16  WBC 8.1  HGB 14.4  HCT 45  PLT 185   Lipid Panel: Recent Labs    03/30/16 10/26/16  CHOL 210* 167  HDL 65 61  LDLCALC 111 79  TRIG 172* 138   Lab Results  Component Value Date   HGBA1C 5.7 10/26/2016   Assessment/Plan 1. Chronic cough -much improved since mold in home addressed, doing better, lungs are clear and have been  Labs/tests ordered:  No new Next appt:  03/09/2017  Charisse Wendell L. Ileta Ofarrell, D.O. [facility] [address], [place] [address] Cell Phone (Mon-Fri 8am-5pm):  [phone] On Call:  [phone] & follow prompts after 5pm & weekends Office Phone:  [phone] Office Fax:  [phone]

## 2016-12-07 DIAGNOSIS — G243 Spasmodic torticollis: Secondary | ICD-10-CM | POA: Diagnosis not present

## 2016-12-07 DIAGNOSIS — M542 Cervicalgia: Secondary | ICD-10-CM | POA: Diagnosis not present

## 2016-12-07 DIAGNOSIS — M438X2 Other specified deforming dorsopathies, cervical region: Secondary | ICD-10-CM | POA: Diagnosis not present

## 2016-12-09 DIAGNOSIS — M438X2 Other specified deforming dorsopathies, cervical region: Secondary | ICD-10-CM | POA: Diagnosis not present

## 2016-12-09 DIAGNOSIS — G243 Spasmodic torticollis: Secondary | ICD-10-CM | POA: Diagnosis not present

## 2016-12-09 DIAGNOSIS — M542 Cervicalgia: Secondary | ICD-10-CM | POA: Diagnosis not present

## 2016-12-14 DIAGNOSIS — B351 Tinea unguium: Secondary | ICD-10-CM | POA: Diagnosis not present

## 2016-12-14 DIAGNOSIS — L708 Other acne: Secondary | ICD-10-CM | POA: Diagnosis not present

## 2016-12-14 DIAGNOSIS — L304 Erythema intertrigo: Secondary | ICD-10-CM | POA: Diagnosis not present

## 2016-12-14 DIAGNOSIS — L720 Epidermal cyst: Secondary | ICD-10-CM | POA: Diagnosis not present

## 2016-12-14 DIAGNOSIS — L853 Xerosis cutis: Secondary | ICD-10-CM | POA: Diagnosis not present

## 2016-12-16 DIAGNOSIS — G243 Spasmodic torticollis: Secondary | ICD-10-CM | POA: Diagnosis not present

## 2016-12-16 DIAGNOSIS — M542 Cervicalgia: Secondary | ICD-10-CM | POA: Diagnosis not present

## 2016-12-16 DIAGNOSIS — M438X2 Other specified deforming dorsopathies, cervical region: Secondary | ICD-10-CM | POA: Diagnosis not present

## 2016-12-21 DIAGNOSIS — G243 Spasmodic torticollis: Secondary | ICD-10-CM | POA: Diagnosis not present

## 2016-12-21 DIAGNOSIS — M542 Cervicalgia: Secondary | ICD-10-CM | POA: Diagnosis not present

## 2016-12-21 DIAGNOSIS — M438X2 Other specified deforming dorsopathies, cervical region: Secondary | ICD-10-CM | POA: Diagnosis not present

## 2016-12-23 DIAGNOSIS — M542 Cervicalgia: Secondary | ICD-10-CM | POA: Diagnosis not present

## 2016-12-23 DIAGNOSIS — G243 Spasmodic torticollis: Secondary | ICD-10-CM | POA: Diagnosis not present

## 2016-12-23 DIAGNOSIS — M438X2 Other specified deforming dorsopathies, cervical region: Secondary | ICD-10-CM | POA: Diagnosis not present

## 2016-12-28 DIAGNOSIS — M542 Cervicalgia: Secondary | ICD-10-CM | POA: Diagnosis not present

## 2016-12-28 DIAGNOSIS — G243 Spasmodic torticollis: Secondary | ICD-10-CM | POA: Diagnosis not present

## 2016-12-28 DIAGNOSIS — M438X2 Other specified deforming dorsopathies, cervical region: Secondary | ICD-10-CM | POA: Diagnosis not present

## 2017-01-13 DIAGNOSIS — M438X2 Other specified deforming dorsopathies, cervical region: Secondary | ICD-10-CM | POA: Diagnosis not present

## 2017-01-13 DIAGNOSIS — G243 Spasmodic torticollis: Secondary | ICD-10-CM | POA: Diagnosis not present

## 2017-01-13 DIAGNOSIS — M542 Cervicalgia: Secondary | ICD-10-CM | POA: Diagnosis not present

## 2017-01-18 DIAGNOSIS — G8929 Other chronic pain: Secondary | ICD-10-CM | POA: Insufficient documentation

## 2017-01-18 DIAGNOSIS — M25561 Pain in right knee: Secondary | ICD-10-CM

## 2017-01-18 DIAGNOSIS — M25562 Pain in left knee: Secondary | ICD-10-CM

## 2017-01-19 DIAGNOSIS — M170 Bilateral primary osteoarthritis of knee: Secondary | ICD-10-CM | POA: Diagnosis not present

## 2017-01-20 DIAGNOSIS — M438X2 Other specified deforming dorsopathies, cervical region: Secondary | ICD-10-CM | POA: Diagnosis not present

## 2017-01-20 DIAGNOSIS — M542 Cervicalgia: Secondary | ICD-10-CM | POA: Diagnosis not present

## 2017-01-20 DIAGNOSIS — G243 Spasmodic torticollis: Secondary | ICD-10-CM | POA: Diagnosis not present

## 2017-02-01 DIAGNOSIS — M438X2 Other specified deforming dorsopathies, cervical region: Secondary | ICD-10-CM | POA: Diagnosis not present

## 2017-02-01 DIAGNOSIS — M542 Cervicalgia: Secondary | ICD-10-CM | POA: Diagnosis not present

## 2017-02-01 DIAGNOSIS — G243 Spasmodic torticollis: Secondary | ICD-10-CM | POA: Diagnosis not present

## 2017-02-03 DIAGNOSIS — M542 Cervicalgia: Secondary | ICD-10-CM | POA: Diagnosis not present

## 2017-02-03 DIAGNOSIS — G243 Spasmodic torticollis: Secondary | ICD-10-CM | POA: Diagnosis not present

## 2017-02-03 DIAGNOSIS — M438X2 Other specified deforming dorsopathies, cervical region: Secondary | ICD-10-CM | POA: Diagnosis not present

## 2017-02-15 DIAGNOSIS — M542 Cervicalgia: Secondary | ICD-10-CM | POA: Diagnosis not present

## 2017-02-15 DIAGNOSIS — G243 Spasmodic torticollis: Secondary | ICD-10-CM | POA: Diagnosis not present

## 2017-02-15 DIAGNOSIS — M438X2 Other specified deforming dorsopathies, cervical region: Secondary | ICD-10-CM | POA: Diagnosis not present

## 2017-02-18 DIAGNOSIS — M438X2 Other specified deforming dorsopathies, cervical region: Secondary | ICD-10-CM | POA: Diagnosis not present

## 2017-02-18 DIAGNOSIS — M542 Cervicalgia: Secondary | ICD-10-CM | POA: Diagnosis not present

## 2017-02-18 DIAGNOSIS — G243 Spasmodic torticollis: Secondary | ICD-10-CM | POA: Diagnosis not present

## 2017-02-22 ENCOUNTER — Other Ambulatory Visit: Payer: Self-pay | Admitting: Internal Medicine

## 2017-02-22 DIAGNOSIS — M438X2 Other specified deforming dorsopathies, cervical region: Secondary | ICD-10-CM | POA: Diagnosis not present

## 2017-02-22 DIAGNOSIS — M542 Cervicalgia: Secondary | ICD-10-CM | POA: Diagnosis not present

## 2017-02-22 DIAGNOSIS — G243 Spasmodic torticollis: Secondary | ICD-10-CM | POA: Diagnosis not present

## 2017-02-24 DIAGNOSIS — G243 Spasmodic torticollis: Secondary | ICD-10-CM | POA: Diagnosis not present

## 2017-02-24 DIAGNOSIS — M438X2 Other specified deforming dorsopathies, cervical region: Secondary | ICD-10-CM | POA: Diagnosis not present

## 2017-02-24 DIAGNOSIS — M542 Cervicalgia: Secondary | ICD-10-CM | POA: Diagnosis not present

## 2017-03-01 DIAGNOSIS — G243 Spasmodic torticollis: Secondary | ICD-10-CM | POA: Diagnosis not present

## 2017-03-01 DIAGNOSIS — M438X2 Other specified deforming dorsopathies, cervical region: Secondary | ICD-10-CM | POA: Diagnosis not present

## 2017-03-01 DIAGNOSIS — M542 Cervicalgia: Secondary | ICD-10-CM | POA: Diagnosis not present

## 2017-03-09 ENCOUNTER — Encounter: Payer: Self-pay | Admitting: Internal Medicine

## 2017-03-09 ENCOUNTER — Non-Acute Institutional Stay: Payer: Medicare Other | Admitting: Internal Medicine

## 2017-03-09 VITALS — BP 120/70 | HR 113 | Temp 97.9°F | Wt 178.0 lb

## 2017-03-09 DIAGNOSIS — N183 Chronic kidney disease, stage 3 unspecified: Secondary | ICD-10-CM

## 2017-03-09 DIAGNOSIS — E7800 Pure hypercholesterolemia, unspecified: Secondary | ICD-10-CM

## 2017-03-09 DIAGNOSIS — E1122 Type 2 diabetes mellitus with diabetic chronic kidney disease: Secondary | ICD-10-CM

## 2017-03-09 DIAGNOSIS — I10 Essential (primary) hypertension: Secondary | ICD-10-CM

## 2017-03-09 DIAGNOSIS — S90121A Contusion of right lesser toe(s) without damage to nail, initial encounter: Secondary | ICD-10-CM | POA: Diagnosis not present

## 2017-03-09 DIAGNOSIS — M810 Age-related osteoporosis without current pathological fracture: Secondary | ICD-10-CM | POA: Diagnosis not present

## 2017-03-09 DIAGNOSIS — R000 Tachycardia, unspecified: Secondary | ICD-10-CM | POA: Diagnosis not present

## 2017-03-09 DIAGNOSIS — M542 Cervicalgia: Secondary | ICD-10-CM | POA: Diagnosis not present

## 2017-03-09 DIAGNOSIS — G243 Spasmodic torticollis: Secondary | ICD-10-CM | POA: Diagnosis not present

## 2017-03-09 DIAGNOSIS — M438X2 Other specified deforming dorsopathies, cervical region: Secondary | ICD-10-CM | POA: Diagnosis not present

## 2017-03-09 NOTE — Progress Notes (Signed)
Location:  Occupational psychologist of Service:  Clinic (12)  Provider: Noor Vidales L. Mariea Clonts, D.O., C.M.D.  Code Status: DNR  Goals of Care:  Advanced Directives 03/09/2017  Does Patient Have a Medical Advance Directive? Yes  Type of Paramedic of [place];Living will  Does patient want to make changes to medical advance directive? No - Patient declined  Copy of [place] in Chart? Yes  Would patient like information on creating a medical advance directive? No - Patient declined  Pre-existing out of facility DNR order (yellow form or pink MOST form) -     Chief Complaint  Patient presents with  . Medical Management of Chronic Issues    84mth follow-up/ds, left toe problem  . ACP    HPI: Patient is a 85 y.o. female seen today for medical management of chronic diseases.    She bumped her foot on her dresser on Saturday and her toes on her left foot are bothering her.  Hasn't been walking for 4-5 days b/c of this.  Has elevated and iced it.  Had been in bare feet when she did it.  Educated on risks of this as years go on.  They've been bringing Bernie to her.     DMII:  On metformin 500mg  po daily.   Lab Results  Component Value Date   HGBA1C 5.7 10/26/2016   Osteoporosis:  On fosamax therapy and vitamin D high dose weekly.  Last bone density was 2014--wants to wait until her toe is better.    BP:  Controlled on losartan/hctz and toprol xl 100mg  daily; however, HR remains elevated despite this.   No longer coughing.  Is to see Dr. Tamala Julian in May.  She's also on his cancellation list.    Hyperlipidemia;  On lipitor 10mg .    Feels pretty good overall.   Not using either cream at present on her list, but may need them in the future so left on list.    Past Medical History:  Diagnosis Date  . Allergy   . Chronic diastolic heart failure [facility]) 12/25/2012   October 2013 echocardiogram 1. There is moderate concentric left  ventricle hypertrophy. 2. Left ventricular ejection fraction estimated by 2D at 60-65 percent. 3. There were no regional wall motion abnormalities. 4. Mild mitral annular calcification. 5. Mild mitral valve regurgitation. 6. There is mild tricuspid regurgitation. 7. Mildly elevated estimated right ventricular systolic pressure. 8. Right ventricular systolic pressure estimated at 35-40 mm Hg. 9. Mild calcification of the aortic valve. 10. Trace aortic valve regurgitation. 11. Analysis of mitral valve inflow, pulmonary vein Doppler and tissue Doppler suggests grade I diastolic dysfunction without elevated left atrial pressure.   . Colon polyp   . Diverticular stricture ([place]) 2009  . Diverticulosis 10-30-2007  . Edema 09/12/13  . Hypercholesterolemia   . Hypertension   . Hypertension, essential   . Hypothyroidism    as a teenager  . Indigestion   . Insomnia   . OA (osteoarthritis) of hip    right hip replacement, OA in bilateral knees  . Osteopenia    Actonel stopped in 2012 after 5years therapy  . Rapid resting heart rate 58/52/7782   Uncertain cause. Possible ectopic atrial tachycardia. Date entered 12/25/12   . Right rotator cuff tear     Past Surgical History:  Procedure Laterality Date  . BREAST REDUCTION SURGERY  1999  . CATARACT EXTRACTION, BILATERAL    . COLONOSCOPY  2009  Dr. Redmond School  . ORIF WRIST FRACTURE    . TOTAL HIP ARTHROPLASTY Right 03-04-08   Dr. Wynelle Link    Allergies  Allergen Reactions  . Amlodipine Besylate Other (See Comments)    Edema  . Lisinopril Cough    Cough   . Sulfamethoxazole-Trimethoprim Other (See Comments)    Unknown reaction     Outpatient Encounter Medications as of 03/09/2017  Medication Sig  . alendronate (FOSAMAX) 70 MG tablet TAKE 1 TAB ONCE A WEEK, AT LEAST 30 MIN BEFORE 1ST FOOD.DO NOT LIE DOWN FOR 30 MIN AFTER TAKING.  Marland Kitchen atorvastatin (LIPITOR) 10 MG tablet TAKE 1 TABLET AT BEDTIME FOR CHOLESTEROL.  . Cholecalciferol (VITAMIN D PO)  Take 1 tablet by mouth every 7 (seven) days.  . hydrocortisone 2.5 % cream   . ketoconazole (NIZORAL) 2 % cream   . losartan-hydrochlorothiazide (HYZAAR) 100-25 MG tablet Take 1 tablet by mouth daily.  . metFORMIN (GLUCOPHAGE) 500 MG tablet TAKE (1/2) TABLET DAILY.  . metoprolol succinate (TOPROL-XL) 100 MG 24 hr tablet Take one and one-half tablets by mouth twice daily.  Marland Kitchen OVER THE COUNTER MEDICATION Take 1 tablet by mouth daily. Med Name: ACID REDUCER   No facility-administered encounter medications on file as of 03/09/2017.     Review of Systems:  Review of Systems  Constitutional: Negative for chills, fever and malaise/fatigue.  HENT: Positive for hearing loss. Negative for congestion.   Eyes: Negative for blurred vision.       Glasses  Respiratory: Negative for cough, shortness of breath and wheezing.   Cardiovascular: Positive for leg swelling. Negative for chest pain and palpitations.       Mild, nonpitting  Gastrointestinal: Negative for abdominal pain, blood in stool, constipation and melena.  Genitourinary: Negative for dysuria.  Musculoskeletal: Negative for falls, myalgias and neck pain.       Did not mention her neck pain; right last two toe pain  Skin: Negative for itching and rash.  Neurological: Negative for dizziness, loss of consciousness and weakness.  Endo/Heme/Allergies: Bruises/bleeds easily.  Psychiatric/Behavioral: Negative for depression and memory loss. The patient is not nervous/anxious and does not have insomnia.     Health Maintenance  Topic Date Due  . FOOT EXAM  09/02/1936  . OPHTHALMOLOGY EXAM  09/02/1936  . TETANUS/TDAP  09/02/1947  . PNA vac Low Risk Adult (2 of 2 - PPSV23) 06/04/2015  . HEMOGLOBIN A1C  04/26/2017  . INFLUENZA VACCINE  Completed  . DEXA SCAN  Completed    Physical Exam: Vitals:   03/09/17 1347  BP: 120/70  Pulse: (!) 113  Temp: 97.9 F (36.6 C)  TempSrc: Oral  SpO2: 95%  Weight: 178 lb (80.7 kg)   Body mass index is  27.88 kg/m. Physical Exam  Constitutional: She is oriented to person, place, and time. She appears well-developed and well-nourished. No distress.  Cardiovascular: Intact distal pulses.  Tachycardic, regular  Pulmonary/Chest: Effort normal and breath sounds normal. No respiratory distress. She has no rales.  Abdominal: Bowel sounds are normal.  Musculoskeletal: Normal range of motion.  Stooped posture, walks with rollator walker; 4th and 5th toes of right foot tender to touch, no swelling, very mild ecchymoses at bases of both, normal flexion and extension  Neurological: She is alert and oriented to person, place, and time.  Skin: Skin is warm and dry. Capillary refill takes less than 2 seconds.  Psychiatric: She has a normal mood and affect.    Labs reviewed: Basic Metabolic Panel: Recent Labs  03/30/16 06/01/16  NA 143 142  K 4.4 4.3  BUN 31* 22*  CREATININE 1.1 1.1   Liver Function Tests: Recent Labs    03/30/16  AST 15  ALT 11  ALKPHOS 69   No results for input(s): LIPASE, AMYLASE in the last 8760 hours. No results for input(s): AMMONIA in the last 8760 hours. CBC: Recent Labs    03/30/16  WBC 8.1  HGB 14.4  HCT 45  PLT 185   Lipid Panel: Recent Labs    03/30/16 10/26/16  CHOL 210* 167  HDL 65 61  LDLCALC 111 79  TRIG 172* 138   Lab Results  Component Value Date   HGBA1C 5.7 10/26/2016    Assessment/Plan 1. Essential hypertension -bp under excellent control with current regimen, no changes needed  2. Rapid resting heart rate -of note, a bit higher today; hurried here, but remained elevated by time of my examination--she is asymptomatic -will copy Dr. Tamala Julian on this in case he'd like to make changes--typically HR has been in 90s more recently and she's already on 150mg  of toprol xl bid--? Reduce it or losartan/hct to add a CCB perhaps -appt with cardiology is not until May  3. CKD (chronic kidney disease) stage 3, GFR 30-59 ml/min (HCC) -ongoing,  Avoid nephrotoxic agents like nsaids, dose adjust renally excreted meds, hydrate.--f/u bmp  4. Type 2 diabetes mellitus with stage 3 chronic kidney disease, without long-term current use of insulin ([place]) -well controlled with metformin -f/u hba1c before next visit, get back to walking when toes feel better, cont to cut down on sweets and starchy foods--did drop 5 lbs from June to Oct and stable at 178 since   5. Hypercholesterolemia -cont lipitor therapy -f/u flp before next visit in june  6. Senile osteoporosis -cont fosamax, vitamin D weekly and I wanted to order bone density today, but she wants to wait until her toes are healed  7. Contusion of lesser toe of right foot without damage to nail, initial encounter -do not appear fractured due to good mobility and no exquisite tenderness, just mild on exam, color normalizing -counseled on avoiding walking around at home w/o shoewear -may cont to elevate and put ice on the sore toes -when they feel better, return to regular walking routine  Labs/tests ordered:  Cbc, cmp, flp, hba1c Next appt:  07/06/2017 med mgt, labs before  Chanta Bauers L. Tannie Koskela, D.O. [facility] [address], [place] [address] Cell Phone (Mon-Fri 8am-5pm):  [phone] On Call:  [phone] & follow prompts after 5pm & weekends Office Phone:  [phone] Office Fax:  [phone]

## 2017-03-14 ENCOUNTER — Telehealth: Payer: Self-pay | Admitting: Interventional Cardiology

## 2017-03-14 NOTE — Telephone Encounter (Signed)
Pt went to MD last week and HR was 113 after sitting and resting for a little bit.  Denies lightheadedness or dizziness.  Pt states she is unaware when her HR is this high.  Has been checking her HR manually at home since last week and it has been 100-105.  Pt currently on Metoprolol Succinate 100mg - 1.5 tabs BID.  Scheduled pt to come in and see Dr. Tamala Julian on Wednesday.  Will route to Dr. Tamala Julian to see if further recommendations.

## 2017-03-14 NOTE — Telephone Encounter (Signed)
New message   STAT if HR is under 50 or over 120 (normal HR is 60-100 beats per minute)  1) What is your heart rate? 100  2) Do you have a log of your heart rate readings (document readings)? 98-->113  3) Do you have any other symptoms? NO

## 2017-03-15 NOTE — Progress Notes (Signed)
She needs an ECG. Could be in A flutter or some other arrhythmia. Anderson [place], she needs to me or an APP.

## 2017-03-15 NOTE — Progress Notes (Signed)
Cardiology Office Note    Date:  03/16/2017   ID:  Kelsey Salinas, DOB [birth year], MRN [id number]  PCP:  Kelsey Curry, DO  Cardiologist: Kelsey Grooms, MD   Chief Complaint  Patient presents with  . Coronary Artery Disease  . Congestive Heart Failure    History of Present Illness:  Kelsey Salinas is a 84 y.o. female for chronic diastolic heart failure, tachycardia, and hypertension.   Despite being on high-dose beta-blocker therapy, Kelsey Salinas has been noted to have resting heart rates above 110 bpm.  She is completely asymptomatic.  Despite the increase in heart rate, diastolic heart failure symptoms have not recurred.   Past Medical History:  Diagnosis Date  . Allergy   . Chronic diastolic heart failure [facility]) 12/25/2012   October 2013 echocardiogram 1. There is moderate concentric left ventricle hypertrophy. 2. Left ventricular ejection fraction estimated by 2D at 60-65 percent. 3. There were no regional wall motion abnormalities. 4. Mild mitral annular calcification. 5. Mild mitral valve regurgitation. 6. There is mild tricuspid regurgitation. 7. Mildly elevated estimated right ventricular systolic pressure. 8. Right ventricular systolic pressure estimated at 35-40 mm Hg. 9. Mild calcification of the aortic valve. 10. Trace aortic valve regurgitation. 11. Analysis of mitral valve inflow, pulmonary vein Doppler and tissue Doppler suggests grade I diastolic dysfunction without elevated left atrial pressure.   . Colon polyp   . Diverticular stricture ([place]) 2009  . Diverticulosis 10-30-2007  . Edema 09/12/13  . Hypercholesterolemia   . Hypertension   . Hypertension, essential   . Hypothyroidism    as a teenager  . Indigestion   . Insomnia   . OA (osteoarthritis) of hip    right hip replacement, OA in bilateral knees  . Osteopenia    Actonel stopped in 2012 after 5years therapy  . Rapid resting heart rate 40/98/1191   Uncertain cause. Possible ectopic atrial  tachycardia. Date entered 12/25/12   . Right rotator cuff tear     Past Surgical History:  Procedure Laterality Date  . BREAST REDUCTION SURGERY  1999  . CATARACT EXTRACTION, BILATERAL    . COLONOSCOPY  2009   Dr. Redmond Salinas  . ORIF WRIST FRACTURE    . TOTAL HIP ARTHROPLASTY Right 03-04-08   Dr. Wynelle Salinas    Current Medications: Outpatient Medications Prior to Visit  Medication Sig Dispense Refill  . alendronate (FOSAMAX) 70 MG tablet Take 70 mg by mouth once a week. Take with a full glass of water on an empty stomach.    Marland Kitchen atorvastatin (LIPITOR) 10 MG tablet Take 10 mg by mouth daily. For cholesterol    . Cholecalciferol (VITAMIN D PO) Take 1 tablet by mouth every 7 (seven) days.    . hydrocortisone 2.5 % cream Apply 1 application topically 2 (two) times daily.   0  . losartan-hydrochlorothiazide (HYZAAR) 100-25 MG tablet Take 1 tablet by mouth daily. 90 tablet 3  . metFORMIN (GLUCOPHAGE) 500 MG tablet Take 250 mg by mouth daily with breakfast.    . metoprolol succinate (TOPROL-XL) 100 MG 24 hr tablet Take one and one-half tablets by mouth twice daily. 90 tablet 11  . OVER THE COUNTER MEDICATION Take 1 tablet by mouth daily. Med Name: ACID REDUCER    . alendronate (FOSAMAX) 70 MG tablet TAKE 1 TAB ONCE A WEEK, AT LEAST 30 MIN BEFORE 1ST FOOD.DO NOT LIE DOWN FOR 30 MIN AFTER TAKING. (Patient not taking: Reported on 03/16/2017) 4 tablet 11  . atorvastatin (LIPITOR)  10 MG tablet TAKE 1 TABLET AT BEDTIME FOR CHOLESTEROL. (Patient not taking: Reported on 03/16/2017) 90 tablet 1  . ketoconazole (NIZORAL) 2 % cream   0  . metFORMIN (GLUCOPHAGE) 500 MG tablet TAKE (1/2) TABLET DAILY. (Patient not taking: Reported on 03/16/2017) 30 tablet 3   No facility-administered medications prior to visit.      Allergies:   Amlodipine besylate; Lisinopril; and Sulfamethoxazole-trimethoprim   Social History   Socioeconomic History  . Marital status: Married    Spouse name: None  . Number of children: None    . Years of education: None  . Highest education level: None  Social Needs  . Financial resource strain: None  . Food insecurity - worry: None  . Food insecurity - inability: None  . Transportation needs - medical: None  . Transportation needs - non-medical: None  Occupational History  . Occupation: retired travel Music therapist  Tobacco Use  . Smoking status: Former Smoker    Last attempt to quit: 01/10/1989    Years since quitting: 28.1  . Smokeless tobacco: Never Used  Substance and Sexual Activity  . Alcohol use: Yes    Alcohol/week: 0.6 oz    Types: 1 Glasses of wine per week    Comment: 1-2 drinks per week  . Drug use: No  . Sexual activity: No  Other Topics Concern  . None  Social History Narrative   Lives at [place] since 2010   Husband Kelsey Salinas married 1952 (husband in memory care unit at [employer])   Former smoker 1/2 PPD, for 30 years, stopped 01/10/1989   Alcohol 2 glasses of wine at night   Exercise: machines 3 times a week and daily walk   No POA/LW           Family History:  The patient's family history includes Heart disease in her brother, father, and mother.   ROS:   Please see the history of present illness.    Leg swelling has been trivially present.  Back pain and neck pain.  Constipation.  No orthopnea or PND. All other systems reviewed and are negative.   PHYSICAL EXAM:   VS:  BP (!) 142/86   Pulse 97   Ht 5\' 8"  (1.727 m)   Wt 178 lb 6.4 oz (80.9 kg)   BMI 27.13 kg/m    GEN: Well nourished, well developed, in no acute distress  HEENT: normal  Neck: no JVD, carotid bruits, or masses Cardiac: Fast but RRR; no murmurs, rubs, or gallops,no edema  Respiratory:  clear to auscultation bilaterally, normal work of breathing GI: soft, nontender, nondistended, + BS MS: no deformity or atrophy  Skin: warm and dry, no rash Neuro:  Alert and Oriented x 3, Strength and sensation are intact Psych: euthymic mood, full affect  Wt Readings from Last 3  Encounters:  03/16/17 178 lb 6.4 oz (80.9 kg)  03/09/17 178 lb (80.7 kg)  12/01/16 178 lb (80.7 kg)      Studies/Labs Reviewed:   EKG:  EKG heart rate 97 bpm at baseline artifact.  Either ectopic atrial pacemaker or possibly atrial tachycardia with 2-1 block.  Some leads suggest the possibility of atrial activity within the T wave.  Recent Labs: 03/30/2016: ALT 11; Hemoglobin 14.4; Platelets 185 06/01/2016: BUN 22; Creatinine 1.1; Potassium 4.3; Sodium 142   Lipid Panel    Component Value Date/Time   CHOL 167 10/26/2016   TRIG 138 10/26/2016   HDL 61 10/26/2016   [place] 79 10/26/2016  Additional studies/ records that were reviewed today include:  None    ASSESSMENT:    1. Rapid resting heart rate   2. AV block   3. Chronic diastolic heart failure ([place])   4. Essential hypertension   5. CKD (chronic kidney disease) stage 3, GFR 30-59 ml/min (HCC)      PLAN:  In order of problems listed above:  1. The presence of heart rates between 97 and 113 bpm on a total of 300 mg of metoprolol succinate per day at her age are suggestive of an organized reentrant arrhythmia or automatic focus in the atrium.  Interrogation of EKG today in some leads suggests the possibility of atrial tachycardia with 2-1 AV conduction although there is too much baseline artifact to be certain.  No change in medical therapy at this time.  24-hour Holter monitor to determine if there is any significant heart rate variability.  Despite the findings of this study, we may continue to choose conservative medical therapy as heart rates under 110 bpm will not be harmful over the long haul especially without her suffering dyspnea due to diastolic dysfunction. 2. Not addressed 3. No volume overload 4. Relatively higher than I would like although at her age we do not want to be under 130/80 mmHg. 5. Kidney disease was not discussed.  I think her son is an Horticulturist, commercial.  I would like to discuss this with  him.  No change in medical regimen.  24-hour Holter monitor.  Further thoughts after the monitor is inspected.  She would like to delay having the monitor placed until her daughter arrives in town later this week.  It would make it easier to return.  I informed her that everything could be done by mail.  Medication Adjustments/Labs and Tests Ordered: Current medicines are reviewed at length with the patient today.  Concerns regarding medicines are outlined above.  Medication changes, Labs and Tests ordered today are listed in the Patient Instructions below. Patient Instructions  Medication Instructions:  Your physician recommends that you continue on your current medications as directed. Please refer to the Current Medication list given to you today.  Labwork: None  Testing/Procedures: Your physician has recommended that you wear a 24 hour holter monitor. Holter monitors are medical devices that record the heart's electrical activity. Doctors most often use these monitors to diagnose arrhythmias. Arrhythmias are problems with the speed or rhythm of the heartbeat. The monitor is a small, portable device. You can wear one while you do your normal daily activities. This is usually used to diagnose what is causing palpitations/syncope (passing out).   Follow-Up: Your physician wants you to follow-up in: 6 months with Dr. Tamala Julian.  You will receive a reminder letter in the mail two months in advance. If you don't receive a letter, please call our office to schedule the follow-up appointment.   Any Other Special Instructions Will Be Listed Below (If Applicable).     If you need a refill on your cardiac medications before your next appointment, please call your pharmacy.      Signed, Kelsey Grooms, MD  03/16/2017 12:15 PM    [facility] [place], [place], [place]  [address] Phone: [phone]; Fax: [phone]

## 2017-03-16 ENCOUNTER — Ambulatory Visit (INDEPENDENT_AMBULATORY_CARE_PROVIDER_SITE_OTHER): Payer: Medicare Other | Admitting: Interventional Cardiology

## 2017-03-16 ENCOUNTER — Encounter: Payer: Self-pay | Admitting: Interventional Cardiology

## 2017-03-16 VITALS — BP 142/86 | HR 97 | Ht 68.0 in | Wt 178.4 lb

## 2017-03-16 DIAGNOSIS — I10 Essential (primary) hypertension: Secondary | ICD-10-CM

## 2017-03-16 DIAGNOSIS — I5032 Chronic diastolic (congestive) heart failure: Secondary | ICD-10-CM | POA: Diagnosis not present

## 2017-03-16 DIAGNOSIS — N183 Chronic kidney disease, stage 3 unspecified: Secondary | ICD-10-CM

## 2017-03-16 DIAGNOSIS — R000 Tachycardia, unspecified: Secondary | ICD-10-CM

## 2017-03-16 DIAGNOSIS — I4430 Unspecified atrioventricular block: Secondary | ICD-10-CM

## 2017-03-16 NOTE — Patient Instructions (Signed)
Medication Instructions:  Your physician recommends that you continue on your current medications as directed. Please refer to the Current Medication list given to you today.  Labwork: None  Testing/Procedures: Your physician has recommended that you wear a 24 hour holter monitor. Holter monitors are medical devices that record the heart's electrical activity. Doctors most often use these monitors to diagnose arrhythmias. Arrhythmias are problems with the speed or rhythm of the heartbeat. The monitor is a small, portable device. You can wear one while you do your normal daily activities. This is usually used to diagnose what is causing palpitations/syncope (passing out).   Follow-Up: Your physician wants you to follow-up in: 6 months with Dr. Tamala Julian.  You will receive a reminder letter in the mail two months in advance. If you don't receive a letter, please call our office to schedule the follow-up appointment.   Any Other Special Instructions Will Be Listed Below (If Applicable).     If you need a refill on your cardiac medications before your next appointment, please call your pharmacy.

## 2017-03-16 NOTE — Telephone Encounter (Signed)
Pt seen in office today.

## 2017-03-29 ENCOUNTER — Other Ambulatory Visit: Payer: Self-pay | Admitting: Internal Medicine

## 2017-04-04 ENCOUNTER — Ambulatory Visit (INDEPENDENT_AMBULATORY_CARE_PROVIDER_SITE_OTHER): Payer: Medicare Other

## 2017-04-04 DIAGNOSIS — I4430 Unspecified atrioventricular block: Secondary | ICD-10-CM | POA: Diagnosis not present

## 2017-04-14 ENCOUNTER — Other Ambulatory Visit: Payer: Self-pay | Admitting: Interventional Cardiology

## 2017-04-22 ENCOUNTER — Telehealth: Payer: Self-pay | Admitting: Internal Medicine

## 2017-04-22 NOTE — Telephone Encounter (Signed)
I left a message asking the pt to call me at [phone] to schedule AWV-S (Last AWV 04/07/16) at [facility] clinic on 05/10/17 if she's available. VDM (DD)

## 2017-05-17 ENCOUNTER — Encounter

## 2017-05-17 ENCOUNTER — Ambulatory Visit: Payer: Medicare Other | Admitting: Interventional Cardiology

## 2017-05-18 ENCOUNTER — Encounter (HOSPITAL_COMMUNITY): Payer: Self-pay | Admitting: Radiology

## 2017-05-18 ENCOUNTER — Emergency Department (HOSPITAL_COMMUNITY): Payer: Medicare Other

## 2017-05-18 ENCOUNTER — Other Ambulatory Visit: Payer: Self-pay

## 2017-05-18 ENCOUNTER — Inpatient Hospital Stay (HOSPITAL_COMMUNITY)
Admission: EM | Admit: 2017-05-18 | Discharge: 2017-05-20 | DRG: 536 | Disposition: A | Discharge status: Skilled Nursing Facility | Payer: Medicare Other | Attending: Internal Medicine | Admitting: Internal Medicine

## 2017-05-18 DIAGNOSIS — S7001XA Contusion of right hip, initial encounter: Secondary | ICD-10-CM | POA: Diagnosis present

## 2017-05-18 DIAGNOSIS — Z87891 Personal history of nicotine dependence: Secondary | ICD-10-CM | POA: Diagnosis not present

## 2017-05-18 DIAGNOSIS — Z9841 Cataract extraction status, right eye: Secondary | ICD-10-CM

## 2017-05-18 DIAGNOSIS — N183 Chronic kidney disease, stage 3 unspecified: Secondary | ICD-10-CM | POA: Diagnosis present

## 2017-05-18 DIAGNOSIS — Z9842 Cataract extraction status, left eye: Secondary | ICD-10-CM

## 2017-05-18 DIAGNOSIS — E039 Hypothyroidism, unspecified: Secondary | ICD-10-CM | POA: Diagnosis present

## 2017-05-18 DIAGNOSIS — S32511A Fracture of superior rim of right pubis, initial encounter for closed fracture: Secondary | ICD-10-CM | POA: Diagnosis not present

## 2017-05-18 DIAGNOSIS — R000 Tachycardia, unspecified: Secondary | ICD-10-CM | POA: Diagnosis not present

## 2017-05-18 DIAGNOSIS — Z882 Allergy status to sulfonamides status: Secondary | ICD-10-CM | POA: Diagnosis not present

## 2017-05-18 DIAGNOSIS — E876 Hypokalemia: Secondary | ICD-10-CM | POA: Diagnosis not present

## 2017-05-18 DIAGNOSIS — G8929 Other chronic pain: Secondary | ICD-10-CM | POA: Diagnosis present

## 2017-05-18 DIAGNOSIS — S3282XA Multiple fractures of pelvis without disruption of pelvic ring, initial encounter for closed fracture: Secondary | ICD-10-CM | POA: Diagnosis not present

## 2017-05-18 DIAGNOSIS — I471 Supraventricular tachycardia: Secondary | ICD-10-CM | POA: Diagnosis present

## 2017-05-18 DIAGNOSIS — I5032 Chronic diastolic (congestive) heart failure: Secondary | ICD-10-CM | POA: Diagnosis not present

## 2017-05-18 DIAGNOSIS — E7800 Pure hypercholesterolemia, unspecified: Secondary | ICD-10-CM | POA: Diagnosis present

## 2017-05-18 DIAGNOSIS — Z96641 Presence of right artificial hip joint: Secondary | ICD-10-CM | POA: Diagnosis present

## 2017-05-18 DIAGNOSIS — S32591D Other specified fracture of right pubis, subsequent encounter for fracture with routine healing: Secondary | ICD-10-CM

## 2017-05-18 DIAGNOSIS — I130 Hypertensive heart and chronic kidney disease with heart failure and stage 1 through stage 4 chronic kidney disease, or unspecified chronic kidney disease: Secondary | ICD-10-CM | POA: Diagnosis present

## 2017-05-18 DIAGNOSIS — E785 Hyperlipidemia, unspecified: Secondary | ICD-10-CM | POA: Diagnosis present

## 2017-05-18 DIAGNOSIS — I2510 Atherosclerotic heart disease of native coronary artery without angina pectoris: Secondary | ICD-10-CM | POA: Diagnosis present

## 2017-05-18 DIAGNOSIS — S32591A Other specified fracture of right pubis, initial encounter for closed fracture: Secondary | ICD-10-CM | POA: Diagnosis not present

## 2017-05-18 DIAGNOSIS — W010XXA Fall on same level from slipping, tripping and stumbling without subsequent striking against object, initial encounter: Secondary | ICD-10-CM | POA: Diagnosis present

## 2017-05-18 DIAGNOSIS — M170 Bilateral primary osteoarthritis of knee: Secondary | ICD-10-CM | POA: Diagnosis present

## 2017-05-18 DIAGNOSIS — T148XXA Other injury of unspecified body region, initial encounter: Secondary | ICD-10-CM | POA: Diagnosis not present

## 2017-05-18 DIAGNOSIS — Z7983 Long term (current) use of bisphosphonates: Secondary | ICD-10-CM

## 2017-05-18 DIAGNOSIS — Z7984 Long term (current) use of oral hypoglycemic drugs: Secondary | ICD-10-CM

## 2017-05-18 DIAGNOSIS — Z7982 Long term (current) use of aspirin: Secondary | ICD-10-CM

## 2017-05-18 DIAGNOSIS — W19XXXA Unspecified fall, initial encounter: Secondary | ICD-10-CM

## 2017-05-18 DIAGNOSIS — Z8249 Family history of ischemic heart disease and other diseases of the circulatory system: Secondary | ICD-10-CM

## 2017-05-18 DIAGNOSIS — D72825 Bandemia: Secondary | ICD-10-CM

## 2017-05-18 DIAGNOSIS — Z888 Allergy status to other drugs, medicaments and biological substances status: Secondary | ICD-10-CM

## 2017-05-18 DIAGNOSIS — D72829 Elevated white blood cell count, unspecified: Secondary | ICD-10-CM | POA: Diagnosis present

## 2017-05-18 DIAGNOSIS — I10 Essential (primary) hypertension: Secondary | ICD-10-CM | POA: Diagnosis present

## 2017-05-18 DIAGNOSIS — M25551 Pain in right hip: Secondary | ICD-10-CM | POA: Diagnosis not present

## 2017-05-18 DIAGNOSIS — K5790 Diverticulosis of intestine, part unspecified, without perforation or abscess without bleeding: Secondary | ICD-10-CM | POA: Diagnosis present

## 2017-05-18 DIAGNOSIS — S32592D Other specified fracture of left pubis, subsequent encounter for fracture with routine healing: Secondary | ICD-10-CM | POA: Diagnosis not present

## 2017-05-18 DIAGNOSIS — M2550 Pain in unspecified joint: Secondary | ICD-10-CM | POA: Diagnosis not present

## 2017-05-18 DIAGNOSIS — Z7952 Long term (current) use of systemic steroids: Secondary | ICD-10-CM

## 2017-05-18 DIAGNOSIS — Z7401 Bed confinement status: Secondary | ICD-10-CM | POA: Diagnosis not present

## 2017-05-18 DIAGNOSIS — S329XXA Fracture of unspecified parts of lumbosacral spine and pelvis, initial encounter for closed fracture: Secondary | ICD-10-CM | POA: Diagnosis present

## 2017-05-18 HISTORY — DX: Unspecified asthma, uncomplicated: J45.909

## 2017-05-18 HISTORY — DX: Unspecified fall, initial encounter: W19.XXXA

## 2017-05-18 HISTORY — DX: Other seasonal allergic rhinitis: J30.2

## 2017-05-18 HISTORY — DX: Pneumonia, unspecified organism: J18.9

## 2017-05-18 LAB — I-STAT CHEM 8, ED
BUN: 23 mg/dL — ABNORMAL HIGH (ref 6–20)
Calcium, Ion: 1.17 mmol/L (ref 1.15–1.40)
Chloride: 103 mmol/L (ref 101–111)
Creatinine, Ser: 1 mg/dL (ref 0.44–1.00)
Glucose, Bld: 103 mg/dL — ABNORMAL HIGH (ref 65–99)
HCT: 40 % (ref 36.0–46.0)
Hemoglobin: 13.6 g/dL (ref 12.0–15.0)
Potassium: 3.3 mmol/L — ABNORMAL LOW (ref 3.5–5.1)
Sodium: 141 mmol/L (ref 135–145)
TCO2: 26 mmol/L (ref 22–32)

## 2017-05-18 LAB — BASIC METABOLIC PANEL
Anion gap: 11 (ref 5–15)
BUN: 20 mg/dL (ref 6–20)
CO2: 26 mmol/L (ref 22–32)
Calcium: 8.9 mg/dL (ref 8.9–10.3)
Chloride: 103 mmol/L (ref 101–111)
Creatinine, Ser: 1.05 mg/dL — ABNORMAL HIGH (ref 0.44–1.00)
GFR calc Af Amer: 53 mL/min — ABNORMAL LOW (ref >60)
GFR calc non Af Amer: 46 mL/min — ABNORMAL LOW (ref >60)
Glucose, Bld: 108 mg/dL — ABNORMAL HIGH (ref 65–99)
Potassium: 3.3 mmol/L — ABNORMAL LOW (ref 3.5–5.1)
Sodium: 140 mmol/L (ref 135–145)

## 2017-05-18 LAB — CBC WITH DIFFERENTIAL/PLATELET
Basophils Absolute: 0 10*3/uL (ref 0.0–0.1)
Basophils Relative: 0 %
Eosinophils Absolute: 0.1 10*3/uL (ref 0.0–0.7)
Eosinophils Relative: 1 %
HCT: 42.6 % (ref 36.0–46.0)
Hemoglobin: 13.5 g/dL (ref 12.0–15.0)
Lymphocytes Relative: 15 %
Lymphs Abs: 2.1 10*3/uL (ref 0.7–4.0)
MCH: 30.3 pg (ref 26.0–34.0)
MCHC: 31.7 g/dL (ref 30.0–36.0)
MCV: 95.7 fL (ref 78.0–100.0)
Monocytes Absolute: 0.8 10*3/uL (ref 0.1–1.0)
Monocytes Relative: 6 %
Neutro Abs: 10.9 10*3/uL — ABNORMAL HIGH (ref 1.7–7.7)
Neutrophils Relative %: 78 %
Platelets: 169 10*3/uL (ref 150–400)
RBC: 4.45 MIL/uL (ref 3.87–5.11)
RDW: 13.4 % (ref 11.5–15.5)
WBC: 13.9 10*3/uL — ABNORMAL HIGH (ref 4.0–10.5)

## 2017-05-18 LAB — CBG MONITORING, ED: Glucose-Capillary: 164 mg/dL — ABNORMAL HIGH (ref 65–99)

## 2017-05-18 LAB — MAGNESIUM: Magnesium: 1.8 mg/dL (ref 1.7–2.4)

## 2017-05-18 MED ORDER — MORPHINE SULFATE (PF) 4 MG/ML IV SOLN
0.5000 mg | INTRAVENOUS | Status: DC | PRN
  Administered 2017-05-19: 0.52 mg via INTRAVENOUS
  Filled 2017-05-18: qty 1

## 2017-05-18 MED ORDER — METHOCARBAMOL 500 MG PO TABS
500.0000 mg | ORAL_TABLET | Freq: Four times a day (QID) | ORAL | Status: DC | PRN
  Administered 2017-05-19 – 2017-05-20 (×3): 500 mg via ORAL
  Filled 2017-05-18 (×3): qty 1

## 2017-05-18 MED ORDER — OXYCODONE HCL 5 MG PO TABS
2.5000 mg | ORAL_TABLET | Freq: Once | ORAL | Status: AC
  Administered 2017-05-18: 2.5 mg via ORAL
  Filled 2017-05-18: qty 1

## 2017-05-18 MED ORDER — OXYCODONE HCL 5 MG PO TABS: 5.0000 mg | ORAL_TABLET | Freq: Once | ORAL | Status: DC

## 2017-05-18 MED ORDER — IOHEXOL 300 MG/ML  SOLN
100.0000 mL | Freq: Once | INTRAMUSCULAR | Status: AC | PRN
Start: 2017-05-18 — End: 2017-05-18
  Administered 2017-05-18: 100 mL via INTRAVENOUS

## 2017-05-18 MED ORDER — OXYCODONE HCL 5 MG PO TABS
5.0000 mg | ORAL_TABLET | ORAL | Status: DC | PRN
Start: 2017-05-18 — End: 2017-05-20
  Administered 2017-05-18 – 2017-05-20 (×4): 5 mg via ORAL
  Filled 2017-05-18 (×5): qty 1

## 2017-05-18 MED ORDER — BISACODYL 10 MG RE SUPP: 10.0000 mg | Freq: Every day | RECTAL | Status: DC | PRN

## 2017-05-18 MED ORDER — METOPROLOL SUCCINATE ER 100 MG PO TB24
100.0000 mg | ORAL_TABLET | Freq: Every day | ORAL | Status: DC
  Administered 2017-05-19: 100 mg via ORAL
  Filled 2017-05-18: qty 1

## 2017-05-18 MED ORDER — POLYETHYLENE GLYCOL 3350 17 G PO PACK
17.0000 g | PACK | Freq: Every day | ORAL | Status: DC | PRN
  Administered 2017-05-19: 17 g via ORAL
  Filled 2017-05-18: qty 1

## 2017-05-18 MED ORDER — METHOCARBAMOL 1000 MG/10ML IJ SOLN
500.0000 mg | Freq: Four times a day (QID) | INTRAVENOUS | Status: DC | PRN
  Filled 2017-05-18: qty 5

## 2017-05-18 MED ORDER — ATORVASTATIN CALCIUM 10 MG PO TABS
10.0000 mg | ORAL_TABLET | Freq: Every day | ORAL | Status: DC
  Administered 2017-05-19 – 2017-05-20 (×2): 10 mg via ORAL
  Filled 2017-05-18 (×2): qty 1

## 2017-05-18 MED ORDER — SODIUM CHLORIDE 0.9 % IV SOLN
INTRAVENOUS | Status: DC
  Administered 2017-05-19: 03:00:00 via INTRAVENOUS

## 2017-05-18 MED ORDER — INSULIN ASPART 100 UNIT/ML ~~LOC~~ SOLN
0.0000 [IU] | Freq: Three times a day (TID) | SUBCUTANEOUS | Status: DC
  Administered 2017-05-19: 3 [IU] via SUBCUTANEOUS

## 2017-05-18 MED ORDER — INSULIN ASPART 100 UNIT/ML ~~LOC~~ SOLN: 0.0000 [IU] | Freq: Every day | SUBCUTANEOUS | Status: DC

## 2017-05-18 MED ORDER — POTASSIUM CHLORIDE CRYS ER 20 MEQ PO TBCR
40.0000 meq | EXTENDED_RELEASE_TABLET | Freq: Once | ORAL | Status: AC
Start: 2017-05-18 — End: 2017-05-18
  Administered 2017-05-18: 40 meq via ORAL
  Filled 2017-05-18: qty 2

## 2017-05-18 MED ORDER — ACETAMINOPHEN 500 MG PO TABS
1000.0000 mg | ORAL_TABLET | Freq: Once | ORAL | Status: AC
Start: 2017-05-18 — End: 2017-05-18
  Administered 2017-05-18: 1000 mg via ORAL
  Filled 2017-05-18: qty 2

## 2017-05-18 NOTE — ED Notes (Signed)
[place], MD at bedside 

## 2017-05-18 NOTE — ED Notes (Addendum)
Pt/pt family requesting pain medicine. Admitting provider messaged.

## 2017-05-18 NOTE — ED Notes (Signed)
Attempted to call report

## 2017-05-18 NOTE — H&P (Addendum)
Kelsey Salinas CHY:[id number] DOB: [birth year] DOA: 05/18/2017     PCP: Gayland Curry, DO   Outpatient Specialists: ortho Alus[facility] CARDS:  Dr. Tamala Julian     Patient arrived to ER on 05/18/17 at 1650  Patient coming from:   From facility [facility]  Chief Complaint:  Chief Complaint  Patient presents with  . Fall    HPI: Kelsey Salinas is a 84 y.o. female with medical history significant of hip replacement, diverticulosis, HUD, HTN, hypothyroidism, tachycardia CAD, chronic diastolic CHF  Presented with   mechanical fall when she tripped over a wheel of her walker with severe right hip pain and was unable to ambulate thereafter  Regarding pertinent Chronic problems: long standing tachycardia with resting heart rate about 110 fallowed by cardiology   While in ER:   Following Medications were ordered in ER: Medications  acetaminophen (TYLENOL) tablet 1,000 mg (1,000 mg Oral Given 05/18/17 1712)  oxyCODONE (Oxy IR/ROXICODONE) immediate release tablet 2.5 mg (2.5 mg Oral Given 05/18/17 1713)  iohexol (OMNIPAQUE) 300 MG/ML solution 100 mL (100 mLs Intravenous Contrast Given 05/18/17 2034)    Significant initial  Findings: Abnormal Labs Reviewed  CBC WITH DIFFERENTIAL/PLATELET - Abnormal; Notable for the following components:      Result Value   WBC 13.9 (*)    Neutro Abs 10.9 (*)    All other components within normal limits  BASIC METABOLIC PANEL - Abnormal; Notable for the following components:   Potassium 3.3 (*)    Glucose, Bld 108 (*)    Creatinine, Ser 1.05 (*)    GFR calc non Af Amer 46 (*)    GFR calc Af Amer 53 (*)    All other components within normal limits  I-STAT CHEM 8, ED - Abnormal; Notable for the following components:   Potassium 3.3 (*)    BUN 23 (*)    Glucose, Bld 103 (*)    All other components within normal limits     Na 141 K 3.3  Cr    stable,    Lab Results  Component Value Date   CREATININE 1.00 05/18/2017   CREATININE 1.05 (H) 05/18/2017   CREATININE 1.1 06/01/2016   HG/HCT stable,      Component Value Date/Time   HGB 13.6 05/18/2017 1918   HCT 40.0 05/18/2017 1918         UA  ordered   CT pelvis -  Acute comminuted and mildly displaced fractures of the right superior and inferior pubic rami. 3. Minimally displaced acute fractures of the margins of left pubic symphysis. 4. Nondisplaced acute fracture of the right iliac crest.  ECG:  Not obtained     ED Triage Vitals  Enc Vitals Group     BP 05/18/17 1655 (!) 150/84     Pulse Rate 05/18/17 1655 96     Resp 05/18/17 1655 20     Temp 05/18/17 1655 98.6 F (37 C)     Temp Source 05/18/17 1655 Oral     SpO2 05/18/17 1655 98 %     Weight 05/18/17 1656 170 lb (77.1 kg)     Height 05/18/17 1656 5\' 7"  (1.702 m)     Head Circumference --      Peak Flow --      Pain Score 05/18/17 1655 7     Pain Loc --      Pain Edu? --      Excl. in [place]? --   INOM(76)@  Latest  Blood pressure (!) 150/84, pulse 96, temperature 98.6 F (37 C), temperature source Oral, resp. rate 20, height 5\' 7"  (1.702 m), weight 77.1 kg (170 lb), SpO2 98 %.     ER Provider Called: orthopedics    Dr.Norris They Recommend admit to medicine for pain management and skill nursing facility placement Will see in   in ER  Hospitalist was called for admission for Right superior and inferior pubic ramus fractures requiring  pain management    Review of Systems:    Pertinent positives include: right hip pain  Constitutional:  No weight loss, night sweats, Fevers, chills, fatigue, weight loss  HEENT:  No headaches, Difficulty swallowing,Tooth/dental problems,Sore throat,  No sneezing, itching, ear ache, nasal congestion, post nasal drip,  Cardio-vascular:  No chest pain, Orthopnea, PND, anasarca, dizziness, palpitations.no Bilateral lower extremity swelling  GI:  No heartburn, indigestion, abdominal pain, nausea, vomiting, diarrhea, change in bowel habits, loss of appetite, melena, blood  in stool, hematemesis Resp:  no shortness of breath at rest. No dyspnea on exertion, No excess mucus, no productive cough, No non-productive cough, No coughing up of blood.No change in color of mucus.No wheezing. Skin:  no rash or lesions. No jaundice GU:  no dysuria, change in color of urine, no urgency or frequency. No straining to urinate.  No flank pain.  Musculoskeletal:  No joint pain or no joint swelling. No decreased range of motion. No back pain.  Psych:  No change in mood or affect. No depression or anxiety. No memory loss.  Neuro: no localizing neurological complaints, no tingling, no weakness, no double vision, no gait abnormality, no slurred speech, no confusion  As per HPI otherwise 10 point review of systems negative.   Past Medical History:   Past Medical History:  Diagnosis Date  . Allergy   . Chronic diastolic heart failure [facility]) 12/25/2012   October 2013 echocardiogram 1. There is moderate concentric left ventricle hypertrophy. 2. Left ventricular ejection fraction estimated by 2D at 60-65 percent. 3. There were no regional wall motion abnormalities. 4. Mild mitral annular calcification. 5. Mild mitral valve regurgitation. 6. There is mild tricuspid regurgitation. 7. Mildly elevated estimated right ventricular systolic pressure. 8. Right ventricular systolic pressure estimated at 35-40 mm Hg. 9. Mild calcification of the aortic valve. 10. Trace aortic valve regurgitation. 11. Analysis of mitral valve inflow, pulmonary vein Doppler and tissue Doppler suggests grade I diastolic dysfunction without elevated left atrial pressure.   . Colon polyp   . Diverticular stricture ([place]) 2009  . Diverticulosis 10-30-2007  . Edema 09/12/13  . Hypercholesterolemia   . Hypertension   . Hypertension, essential   . Hypothyroidism    as a teenager  . Indigestion   . Insomnia   . OA (osteoarthritis) of hip    right hip replacement, OA in bilateral knees  . Osteopenia    Actonel  stopped in 2012 after 5years therapy  . Rapid resting heart rate 25/36/6440   Uncertain cause. Possible ectopic atrial tachycardia. Date entered 12/25/12   . Right rotator cuff tear       Past Surgical History:  Procedure Laterality Date  . BREAST REDUCTION SURGERY  1999  . CATARACT EXTRACTION, BILATERAL    . COLONOSCOPY  2009   Dr. Redmond School  . ORIF WRIST FRACTURE    . TOTAL HIP ARTHROPLASTY Right 03-04-08   Dr. Wynelle Link    Social History:  Ambulatory  walker       reports that she quit smoking  about 28 years ago. She has never used smokeless tobacco. She reports that she drinks about 0.6 oz of alcohol per week. She reports that she does not use drugs.     Family History:   Family History  Problem Relation Age of Onset  . Heart disease Mother   . Heart disease Father   . Heart disease Brother     Allergies: Allergies  Allergen Reactions  . Amlodipine Besylate Other (See Comments)    Edema  . Lisinopril Cough    Cough   . Sulfamethoxazole-Trimethoprim Other (See Comments)    Unknown reaction      Prior to Admission medications   Medication Sig Start Date End Date Taking? Authorizing Provider  alendronate (FOSAMAX) 70 MG tablet Take 70 mg by mouth once a week. Take with a full glass of water on an empty stomach.    [provider]  atorvastatin (LIPITOR) 10 MG tablet Take 10 mg by mouth daily. For cholesterol    [provider]  Cholecalciferol (VITAMIN D PO) Take 1 tablet by mouth every 7 (seven) days.    [provider]  hydrocortisone 2.5 % cream Apply 1 application topically 2 (two) times daily.  08/31/16   [provider]  losartan-hydrochlorothiazide (HYZAAR) 100-25 MG tablet Take 1 tablet by mouth daily. 08/24/16   Reed, Tiffany L, DO  metFORMIN (GLUCOPHAGE) 500 MG tablet TAKE (1/2) TABLET DAILY. 03/29/17   Reed, Tiffany L, DO  metoprolol succinate (TOPROL-XL) 100 MG 24 hr tablet Take one and a half (1 and 1/2) tablets (150  mg) by mouth twice daily. 04/14/17   Belva Crome, MD  OVER THE COUNTER MEDICATION Take 1 tablet by mouth daily. Med Name: ACID REDUCER    [provider]   Physical Exam: Blood pressure (!) 150/84, pulse 96, temperature 98.6 F (37 C), temperature source Oral, resp. rate 20, height 5\' 7"  (1.702 m), weight 77.1 kg (170 lb), SpO2 98 %. 1. General:  in No Acute distress  well   -appearing 2. Psychological: Alert and Oriented 3. Head/ENT:     Dry Mucous Membranes                          Head Non traumatic, neck supple                          Poor Dentition 4. SKIN:   decreased Skin turgor,  Skin clean Dry and intact no rash 5. Heart: Regular rate and rhythm no  Murmur, no Rub or gallop 6. Lungs:  Clear to auscultation bilaterally, no wheezes or crackles   7. Abdomen: Soft,  non-tender, Non distended  bowel sounds present 8. Lower extremities: no clubbing, cyanosis, or edema 9. Neurologically Grossly intact, moving all 4 extremities equally   10. MSK: Normal range of motion   LABS:     Recent Labs  Lab 05/18/17 1900 05/18/17 1918  WBC 13.9*  --   NEUTROABS 10.9*  --   HGB 13.5 13.6  HCT 42.6 40.0  MCV 95.7  --   PLT 169  --    Basic Metabolic Panel: Recent Labs  Lab 05/18/17 1900 05/18/17 1918  NA 140 141  K 3.3* 3.3*  CL 103 103  CO2 26  --   GLUCOSE 108* 103*  BUN 20 23*  CREATININE 1.05* 1.00  CALCIUM 8.9  --       No results  for input(s): AST, ALT, ALKPHOS, BILITOT, PROT, ALBUMIN in the last 168 hours. No results for input(s): LIPASE, AMYLASE in the last 168 hours. No results for input(s): AMMONIA in the last 168 hours.    HbA1C: No results for input(s): HGBA1C in the last 72 hours. CBG: No results for input(s): GLUCAP in the last 168 hours.    Urine analysis:    Component Value Date/Time   COLORURINE YELLOW 03/05/2008 0957   APPEARANCEUR CLOUDY (A) 03/05/2008 0957   LABSPEC 1.024 03/05/2008 0957   PHURINE 5.5 03/05/2008 0957   GLUCOSEU  NEGATIVE 03/05/2008 0957   HGBUR NEGATIVE 03/05/2008 0957   BILIRUBINUR NEGATIVE 03/05/2008 0957   KETONESUR NEGATIVE 03/05/2008 0957   PROTEINUR NEGATIVE 03/05/2008 0957   UROBILINOGEN 0.2 03/05/2008 0957   NITRITE NEGATIVE 03/05/2008 0957   LEUKOCYTESUR SMALL (A) 03/05/2008 0957       Cultures:    Component Value Date/Time   SDES URINE, CATHETERIZED 03/05/2008 0957   SPECREQUEST IMMUNE:NORM UT SYMPT:NEG 03/05/2008 0957   CULT NO GROWTH 03/05/2008 0957   REPTSTATUS 03/06/2008 FINAL 03/05/2008 0957     Radiological Exams on Admission: Dg Hip Unilat W Or Wo Pelvis 2-3 Views Right  Result Date: 05/18/2017 CLINICAL DATA:  Fall with right hip pain. Right hip replacement 7 years ago. EXAM: DG HIP (WITH OR WITHOUT PELVIS) 2-3V RIGHT COMPARISON:  02/28/2008 FINDINGS: Right hip arthroplasty intact. Moderate osteoarthritic change of the left hip. Old right inferior pubic ramus fracture. Acute displaced fracture involving the right superior pubic ramus extending to the symphysis. Degenerate change of the spine. IMPRESSION: Acute displaced fracture of the right superior pubic ramus extending to the symphysis. Electronically Signed   By: Marin Olp M.D.   On: 05/18/2017 18:49    Chart has been reviewed    Assessment/Plan  84 y.o. female with medical history significant of hip replacement, diverticulosis, HUD, HTN, hypothyroidism, tachycardia CAD, chronic diastolic CHF   Admitted for Right superior and inferior pubic ramus fractures requiring  pain management  Present on Admission: . Pelvic fracture ([place]) - PT/OT pain management, given small amount of hematoma will hold off on Lovenox or SCDs hold aspirin . Chronic diastolic heart failure (HCC) currently appears to be hemodynamically stable and euvolemic we will continue to monitor avoid fluid overload . CKD (chronic kidney disease) stage 3, GFR 30-59 ml/min (HCC) -currently stable avoid nephrotoxic medications . Essential hypertension  -stable continue home medications . Hypercholesterolemia -stable continue home medications  . Rapid resting heart rate -currently at baseline restart metoprolol Been followed by cardiology in the past have had Holter monitor this appears to be chronic  . Leukocytosis   - likely stress related but will evaluate for any possible source of infection  Other plan as per orders.  DVT prophylaxis:  SCD    Code Status:  FULL CODE  as per patient  I had personally discussed CODE STATUS with patient and family   Family Communication:   Family   at  Bedside  plan of care was discussed with  Daughter,   Disposition Plan:     likely will need placement for rehabilitation                                                  Would benefit from PT/OT eval prior to DC   ordered  Social Work                           Consults called: orthopedics aware  Admission status:    inpatient     Level of care     medical floor            Toy Baker 05/19/2017, 1:15 AM    [facility]  Pager [phone]   after 2 AM please page floor coverage PA If 7AM-7PM, please contact the day team taking care of the patient  [email or web address]  Password [email or web address]

## 2017-05-18 NOTE — ED Notes (Signed)
CBG 164  

## 2017-05-18 NOTE — ED Provider Notes (Signed)
[place] EMERGENCY DEPARTMENT Provider Note   CSN: [id number] Arrival date & time: 05/18/17  1650     History   Chief Complaint Chief Complaint  Patient presents with  . Fall    HPI Kelsey Salinas is a 83 y.o. female.  84 yo F with a chief complaint of right leg pain.  This happened after mechanical fall.  The patient was walking and she tripped over a wheel of her walker and fell onto her right side.  She was unable to get up due to severe pain to her right leg.  She is having trouble extending it.  She denies any other injury denies hitting her head denies chest pain neck pain back pain or abdominal pain.  She was well open to this time.  The history is provided by the patient.  Fall  This is a new problem. The current episode started less than 1 hour ago. The problem occurs constantly. The problem has not changed since onset.Pertinent negatives include no chest pain, no headaches and no shortness of breath. Nothing aggravates the symptoms. Nothing relieves the symptoms. She has tried nothing for the symptoms. The treatment provided no relief.    Past Medical History:  Diagnosis Date  . Allergy   . Chronic diastolic heart failure [facility]) 12/25/2012   October 2013 echocardiogram 1. There is moderate concentric left ventricle hypertrophy. 2. Left ventricular ejection fraction estimated by 2D at 60-65 percent. 3. There were no regional wall motion abnormalities. 4. Mild mitral annular calcification. 5. Mild mitral valve regurgitation. 6. There is mild tricuspid regurgitation. 7. Mildly elevated estimated right ventricular systolic pressure. 8. Right ventricular systolic pressure estimated at 35-40 mm Hg. 9. Mild calcification of the aortic valve. 10. Trace aortic valve regurgitation. 11. Analysis of mitral valve inflow, pulmonary vein Doppler and tissue Doppler suggests grade I diastolic dysfunction without elevated left atrial pressure.   . Colon polyp   .  Diverticular stricture ([place]) 2009  . Diverticulosis 10-30-2007  . Edema 09/12/13  . Hypercholesterolemia   . Hypertension   . Hypertension, essential   . Hypothyroidism    as a teenager  . Indigestion   . Insomnia   . OA (osteoarthritis) of hip    right hip replacement, OA in bilateral knees  . Osteopenia    Actonel stopped in 2012 after 5years therapy  . Rapid resting heart rate 85/27/7824   Uncertain cause. Possible ectopic atrial tachycardia. Date entered 12/25/12   . Right rotator cuff tear     Patient Active Problem List   Diagnosis Date Noted  . Leukocytosis 05/18/2017  . Closed pelvic fracture ([place]) 05/18/2017  . Pelvic fracture ([place]) 05/18/2017  . Bilateral primary osteoarthritis of knee 04/07/2016  . Hyperglycemia 10/01/2015  . Fracture of 5th metatarsal 02/14/2015  . CKD (chronic kidney disease) stage 3, GFR 30-59 ml/min (HCC) 12/10/2013  . Sebaceous cyst 12/10/2013  . Insomnia   . Colon polyp   . Diverticular stricture ([place])   . Osteopenia   . Vitamin D deficiency 10/31/2013  . Indigestion   . Primary osteoarthritis of hip   . Chronic diastolic heart failure ([place]) 12/25/2012  . Rapid resting heart rate 12/25/2012  . Essential hypertension 12/25/2012  . Hypercholesterolemia     Past Surgical History:  Procedure Laterality Date  . BREAST REDUCTION SURGERY  1999  . CATARACT EXTRACTION, BILATERAL    . COLONOSCOPY  2009   Dr. Redmond [facility]  . ORIF WRIST FRACTURE    . TOTAL  HIP ARTHROPLASTY Right 03-04-08   Dr. Wynelle Link     OB History   None      Home Medications    Prior to Admission medications   Medication Sig Start Date End Date Taking? Authorizing Provider  alendronate (FOSAMAX) 70 MG tablet Take 70 mg by mouth once a week. Take with a full glass of water on an empty stomach.   Yes [provider]  aspirin EC 81 MG tablet Take 81 mg by mouth daily.   Yes [provider]  atorvastatin (LIPITOR) 10 MG tablet Take 10 mg by mouth daily.  For cholesterol   Yes [provider]  Cholecalciferol (VITAMIN D PO) Take 1 tablet by mouth every 7 (seven) days.   Yes [provider]  diclofenac sodium (VOLTAREN) 1 % GEL Apply 2 g topically every 6 (six) hours as needed.   Yes [provider]  hydrocortisone 2.5 % cream Apply 1 application topically as needed.  08/31/16  Yes [provider]  losartan-hydrochlorothiazide (HYZAAR) 100-25 MG tablet Take 1 tablet by mouth daily. 08/24/16  Yes Reed, Tiffany L, DO  metFORMIN (GLUCOPHAGE) 500 MG tablet TAKE (1/2) TABLET DAILY. 03/29/17  Yes Reed, Tiffany L, DO  metoprolol succinate (TOPROL-XL) 100 MG 24 hr tablet Take one and a half (1 and 1/2) tablets (150 mg) by mouth twice daily. 04/14/17  Yes Belva Crome, MD  OVER THE COUNTER MEDICATION Take 1 tablet by mouth daily. Med Name: ACID REDUCER   Yes [provider]    Family History Family History  Problem Relation Age of Onset  . Heart disease Mother   . Heart disease Father   . Heart disease Brother     Social History Social History   Tobacco Use  . Smoking status: Former Smoker    Last attempt to quit: 01/10/1989    Years since quitting: 28.3  . Smokeless tobacco: Never Used  Substance Use Topics  . Alcohol use: Yes    Alcohol/week: 0.6 oz    Types: 1 Glasses of wine per week    Comment: 1-2 drinks per week  . Drug use: No     Allergies   Amlodipine besylate; Lisinopril; and Sulfamethoxazole-trimethoprim   Review of Systems Review of Systems  Constitutional: Negative for chills and fever.  HENT: Negative for congestion and rhinorrhea.   Eyes: Negative for redness and visual disturbance.  Respiratory: Negative for shortness of breath and wheezing.   Cardiovascular: Negative for chest pain and palpitations.  Gastrointestinal: Negative for nausea and vomiting.  Genitourinary: Negative for dysuria and urgency.  Musculoskeletal: Positive for arthralgias. Negative for myalgias.    Skin: Negative for pallor and wound.  Neurological: Negative for dizziness and headaches.     Physical Exam Updated Vital Signs BP 132/77   Pulse 100   Temp 98.6 F (37 C) (Oral)   Resp 14   Ht 5\' 7"  (1.702 m)   Wt 77.1 kg (170 lb)   SpO2 93%   BMI 26.63 kg/m   Physical Exam  Constitutional: She is oriented to person, place, and time. She appears well-developed and well-nourished. No distress.  HENT:  Head: Normocephalic and atraumatic.  Eyes: Pupils are equal, round, and reactive to light. EOM are normal.  Neck: Normal range of motion. Neck supple.  Cardiovascular: Normal rate and regular rhythm. Exam reveals no gallop and no friction rub.  No murmur heard. Pulmonary/Chest: Effort normal. She has no wheezes. She has no rales.  Abdominal: Soft. She  exhibits no distension and no mass. There is no tenderness. There is no guarding.  Musculoskeletal: She exhibits tenderness ( Tenderness is worse to the right thigh below the inguinal ligament.  Pulse motor and sensation is intact distally.  No pain with internal and external rotation of the right lower extremity). She exhibits no edema.  Neurological: She is alert and oriented to person, place, and time.  Skin: Skin is warm and dry. She is not diaphoretic.  Psychiatric: She has a normal mood and affect. Her behavior is normal.  Nursing note and vitals reviewed.    ED Treatments / Results  Labs (all labs ordered are listed, but only abnormal results are displayed) Labs Reviewed  CBC WITH DIFFERENTIAL/PLATELET - Abnormal; Notable for the following components:      Result Value   WBC 13.9 (*)    Neutro Abs 10.9 (*)    All other components within normal limits  BASIC METABOLIC PANEL - Abnormal; Notable for the following components:   Potassium 3.3 (*)    Glucose, Bld 108 (*)    Creatinine, Ser 1.05 (*)    GFR calc non Af Amer 46 (*)    GFR calc Af Amer 53 (*)    All other components within normal limits  I-STAT CHEM 8, ED  - Abnormal; Notable for the following components:   Potassium 3.3 (*)    BUN 23 (*)    Glucose, Bld 103 (*)    All other components within normal limits  CBG MONITORING, ED - Abnormal; Notable for the following components:   Glucose-Capillary 164 (*)    All other components within normal limits  URINALYSIS, ROUTINE W REFLEX MICROSCOPIC  MAGNESIUM  CBC  BASIC METABOLIC PANEL  VITAMIN D 25 HYDROXY (VIT D DEFICIENCY, FRACTURES)  ALBUMIN  HEMOGLOBIN A1C  TYPE AND SCREEN    EKG EKG Interpretation  Date/Time:  Wednesday May 18 2017 22:39:17 EDT Ventricular Rate:  103 PR Interval:    QRS Duration: 92 QT Interval:  334 QTC Calculation: 438 R Axis:   32 Text Interpretation:  Sinus tachycardia Low voltage, precordial leads No old tracing to compare Confirmed by Delora Fuel ([id number]) on 05/18/2017 11:15:42 PM   Radiology Ct Pelvis W Contrast  Result Date: 05/18/2017 CLINICAL DATA:  84 y/o F; fall on right side. Right hip replacement 2010. EXAM: CT PELVIS WITH CONTRAST TECHNIQUE: Multidetector CT imaging of the pelvis was performed using the standard protocol following the bolus administration of intravenous contrast. CONTRAST:  192mL OMNIPAQUE IOHEXOL 300 MG/ML  SOLN COMPARISON:  03/04/2008 pelvis radiographs. FINDINGS: Urinary Tract:  No abnormality visualized. Bowel:  Unremarkable visualized pelvic bowel loops. Vascular/Lymphatic: Calcific atherosclerosis of the aorta. Reproductive:  Calcified uterine myomas measuring up to 21 mm. Other: Ill-defined contusion/hematoma in the soft tissues overlying the right hip with a blush contrast extravasation indicating active hemorrhage. Musculoskeletal: Acute comminuted and minimally displaced fractures of the right superior and inferior pubic rami. Chronic fracture deformity of the right posterior inferior pubic rami. Minimally displaced acute fractures at the margins of left pubic symphysis. Nondisplaced fracture of the right iliac crest. Moderate to  severe osteoarthrosis of the left hip joint with loss of joint space and periarticular osteophytes. IMPRESSION: 1. Ill-defined contusion/hematoma in the soft tissues overlying the right hip. Blush of contrast indicates active hemorrhage. 2. Acute comminuted and mildly displaced fractures of the right superior and inferior pubic rami. 3. Minimally displaced acute fractures of the margins of left pubic symphysis. 4. Nondisplaced acute fracture of  the right iliac crest. 5. 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 6. Aortic atherosclerosis. 7. Myomatous uterus. These results were called by telephone at the time of interpretation on 05/18/2017 at 8:54 pm to Dr. Deno Etienne , who verbally acknowledged these results. Electronically Signed   By: Kristine Garbe M.D.   On: 05/18/2017 20:56   Dg Hip Unilat W Or Wo Pelvis 2-3 Views Right  Result Date: 05/18/2017 CLINICAL DATA:  Fall with right hip pain. Right hip replacement 7 years ago. EXAM: DG HIP (WITH OR WITHOUT PELVIS) 2-3V RIGHT COMPARISON:  02/28/2008 FINDINGS: Right hip arthroplasty intact. Moderate osteoarthritic change of the left hip. Old right inferior pubic ramus fracture. Acute displaced fracture involving the right superior pubic ramus extending to the symphysis. Degenerate change of the spine. IMPRESSION: Acute displaced fracture of the right superior pubic ramus extending to the symphysis. Electronically Signed   By: Marin Olp M.D.   On: 05/18/2017 18:49    Procedures Procedures (including critical care time)  Medications Ordered in ED Medications  oxyCODONE (Oxy IR/ROXICODONE) immediate release tablet 5 mg (5 mg Oral Given 05/18/17 2134)  atorvastatin (LIPITOR) tablet 10 mg (has no administration in time range)  metoprolol succinate (TOPROL-XL) 24 hr tablet 100 mg (has no administration in time range)  morphine 4 MG/ML injection 0.52 mg (has no administration in time range)  methocarbamol (ROBAXIN) tablet 500 mg (has no  administration in time range)    Or  methocarbamol (ROBAXIN) 500 mg in dextrose 5 % 50 mL IVPB (has no administration in time range)  insulin aspart (novoLOG) injection 0-5 Units (0 Units Subcutaneous Hold 05/18/17 2243)  0.9 %  sodium chloride infusion (has no administration in time range)  bisacodyl (DULCOLAX) suppository 10 mg (has no administration in time range)  polyethylene glycol (MIRALAX / GLYCOLAX) packet 17 g (has no administration in time range)  insulin aspart (novoLOG) injection 0-9 Units (has no administration in time range)  acetaminophen (TYLENOL) tablet 1,000 mg (1,000 mg Oral Given 05/18/17 1712)  oxyCODONE (Oxy IR/ROXICODONE) immediate release tablet 2.5 mg (2.5 mg Oral Given 05/18/17 1713)  iohexol (OMNIPAQUE) 300 MG/ML solution 100 mL (100 mLs Intravenous Contrast Given 05/18/17 2034)  potassium chloride SA (K-DUR,KLOR-CON) CR tablet 40 mEq (40 mEq Oral Given 05/18/17 2134)     Initial Impression / Assessment and Plan / ED Course  I have reviewed the triage vital signs and the nursing notes.  Pertinent labs & imaging results that were available during my care of the patient were reviewed by me and considered in my medical decision making (see chart for details).     84 yo F with a chief complaint of a fall from standing.  Mechanical by nature.  Patient has a right pubic rami fracture on my read of the xray. .  She is unable to ambulate, therefore will discuss with medicine for admission.  Labs are ordered.  Discussed with Dr. Veverly Fells, orthopedics evaluated patient at bedside.   The patients results and plan were reviewed and discussed.   Any x-rays performed were independently reviewed by myself.   Differential diagnosis were considered with the presenting HPI.  Medications  oxyCODONE (Oxy IR/ROXICODONE) immediate release tablet 5 mg (5 mg Oral Given 05/18/17 2134)  atorvastatin (LIPITOR) tablet 10 mg (has no administration in time range)  metoprolol succinate (TOPROL-XL) 24  hr tablet 100 mg (has no administration in time range)  morphine 4 MG/ML injection 0.52 mg (has no administration in time range)  methocarbamol (  ROBAXIN) tablet 500 mg (has no administration in time range)    Or  methocarbamol (ROBAXIN) 500 mg in dextrose 5 % 50 mL IVPB (has no administration in time range)  insulin aspart (novoLOG) injection 0-5 Units (0 Units Subcutaneous Hold 05/18/17 2243)  0.9 %  sodium chloride infusion (has no administration in time range)  bisacodyl (DULCOLAX) suppository 10 mg (has no administration in time range)  polyethylene glycol (MIRALAX / GLYCOLAX) packet 17 g (has no administration in time range)  insulin aspart (novoLOG) injection 0-9 Units (has no administration in time range)  acetaminophen (TYLENOL) tablet 1,000 mg (1,000 mg Oral Given 05/18/17 1712)  oxyCODONE (Oxy IR/ROXICODONE) immediate release tablet 2.5 mg (2.5 mg Oral Given 05/18/17 1713)  iohexol (OMNIPAQUE) 300 MG/ML solution 100 mL (100 mLs Intravenous Contrast Given 05/18/17 2034)  potassium chloride SA (K-DUR,KLOR-CON) CR tablet 40 mEq (40 mEq Oral Given 05/18/17 2134)    Vitals:   05/18/17 1845 05/18/17 2130 05/18/17 2145 05/18/17 2230  BP: 131/78 136/84 123/85 132/77  Pulse: 99 (!) 101 (!) 103 100  Resp: (!) 21 15 (!) 24 14  Temp:      TempSrc:      SpO2: 93% 92% 94% 93%  Weight:      Height:        Final diagnoses:  Closed fracture of multiple rami of right pubis, initial encounter ([place])    Admission/ observation were discussed with the admitting physician, patient and/or family and they are comfortable with the plan.    Final Clinical Impressions(s) / ED Diagnoses   Final diagnoses:  Closed fracture of multiple rami of right pubis, initial encounter [facility])    ED Discharge Orders    None       Deno Etienne, DO 05/18/17 2322

## 2017-05-18 NOTE — ED Notes (Signed)
ED Provider at bedside. 

## 2017-05-18 NOTE — Consult Note (Signed)
Reason for Consult:Right hip pain after fall Referring Physician: EDP  Kelsey Salinas is an 84 y.o. female.  HPI: Patient with history of right hip replacement by Dr Maureen Ralphs in 2010 with ground level fall today and immediate onset of right groin pain.  Patient unable to stand after the fall.  She presents for further eval.  No other complaints.  Past Medical History:  Diagnosis Date  . Allergy   . Chronic diastolic heart failure [facility]) 12/25/2012   October 2013 echocardiogram 1. There is moderate concentric left ventricle hypertrophy. 2. Left ventricular ejection fraction estimated by 2D at 60-65 percent. 3. There were no regional wall motion abnormalities. 4. Mild mitral annular calcification. 5. Mild mitral valve regurgitation. 6. There is mild tricuspid regurgitation. 7. Mildly elevated estimated right ventricular systolic pressure. 8. Right ventricular systolic pressure estimated at 35-40 mm Hg. 9. Mild calcification of the aortic valve. 10. Trace aortic valve regurgitation. 11. Analysis of mitral valve inflow, pulmonary vein Doppler and tissue Doppler suggests grade I diastolic dysfunction without elevated left atrial pressure.   . Colon polyp   . Diverticular stricture ([place]) 2009  . Diverticulosis 10-30-2007  . Edema 09/12/13  . Hypercholesterolemia   . Hypertension   . Hypertension, essential   . Hypothyroidism    as a teenager  . Indigestion   . Insomnia   . OA (osteoarthritis) of hip    right hip replacement, OA in bilateral knees  . Osteopenia    Actonel stopped in 2012 after 5years therapy  . Rapid resting heart rate 60/73/7106   Uncertain cause. Possible ectopic atrial tachycardia. Date entered 12/25/12   . Right rotator cuff tear     Past Surgical History:  Procedure Laterality Date  . BREAST REDUCTION SURGERY  1999  . CATARACT EXTRACTION, BILATERAL    . COLONOSCOPY  2009   Dr. Redmond [employer]  . ORIF WRIST FRACTURE    . TOTAL HIP ARTHROPLASTY Right 03-04-08   Dr. Wynelle Link     Family History  Problem Relation Age of Onset  . Heart disease Mother   . Heart disease Father   . Heart disease Brother     Social History:  reports that she quit smoking about 28 years ago. She has never used smokeless tobacco. She reports that she drinks about 0.6 oz of alcohol per week. She reports that she does not use drugs.  Allergies:  Allergies  Allergen Reactions  . Amlodipine Besylate Other (See Comments)    Edema  . Lisinopril Cough    Cough   . Sulfamethoxazole-Trimethoprim Other (See Comments)    Unknown reaction     Medications: I have reviewed the patient's current medications.  No results found for this or any previous visit (from the past 48 hour(s)).  Dg Hip Unilat W Or Wo Pelvis 2-3 Views Right  Result Date: 05/18/2017 CLINICAL DATA:  Fall with right hip pain. Right hip replacement 7 years ago. EXAM: DG HIP (WITH OR WITHOUT PELVIS) 2-3V RIGHT COMPARISON:  02/28/2008 FINDINGS: Right hip arthroplasty intact. Moderate osteoarthritic change of the left hip. Old right inferior pubic ramus fracture. Acute displaced fracture involving the right superior pubic ramus extending to the symphysis. Degenerate change of the spine. IMPRESSION: Acute displaced fracture of the right superior pubic ramus extending to the symphysis. Electronically Signed   By: Marin Olp M.D.   On: 05/18/2017 18:49    ROS Blood pressure (!) 150/84, pulse 96, temperature 98.6 F (37 C), temperature source Oral, resp. rate 20, height  5\' 7"  (1.702 m), weight 77.1 kg (170 lb), SpO2 98 %. Physical Exam AAO, NAD, neck non tender, normal AROM, bilateral shoulders and UEs with no deformity and no tenderness and normal strength. Nontender abdomen, Right LE with limited ROM in the hip due to groin pain, knees are nontender and not acutely swollen. Bilateral knees and ankles with no pain with AROM. NVI bilateral LEs  Assessment/Plan: Right superior and inferior pubic ramus fractures. Hip  replacement well seated and no evidence of periprosthetic fracture or sacral fracture. Mobilization with therapy, WBAT on bilateral LEs Pain control DVT prophylaxis - mechanical Patient is a resident at [facility] and may be a good candidate for skilled rehab care. Will follow.   Davona Kinoshita,STEVEN R 05/18/2017, 7:23 PM

## 2017-05-18 NOTE — ED Notes (Signed)
Patient transported to CT 

## 2017-05-18 NOTE — ED Triage Notes (Signed)
Patient turned while walking to answer someone and fell to floor. Patient states "possible right hip pain".

## 2017-05-19 ENCOUNTER — Encounter (HOSPITAL_COMMUNITY): Payer: Self-pay | Admitting: General Practice

## 2017-05-19 DIAGNOSIS — S32592D Other specified fracture of left pubis, subsequent encounter for fracture with routine healing: Secondary | ICD-10-CM

## 2017-05-19 DIAGNOSIS — S32591D Other specified fracture of right pubis, subsequent encounter for fracture with routine healing: Secondary | ICD-10-CM

## 2017-05-19 LAB — URINALYSIS, ROUTINE W REFLEX MICROSCOPIC
Bilirubin Urine: NEGATIVE
Glucose, UA: NEGATIVE mg/dL
Hgb urine dipstick: NEGATIVE
Ketones, ur: NEGATIVE mg/dL
Leukocytes, UA: NEGATIVE
Nitrite: POSITIVE — AB
Protein, ur: NEGATIVE mg/dL
Specific Gravity, Urine: 1.031 — ABNORMAL HIGH (ref 1.005–1.030)
pH: 5 (ref 5.0–8.0)

## 2017-05-19 LAB — CBC
HCT: 39.4 % (ref 36.0–46.0)
Hemoglobin: 12.3 g/dL (ref 12.0–15.0)
MCH: 29.9 pg (ref 26.0–34.0)
MCHC: 31.2 g/dL (ref 30.0–36.0)
MCV: 95.9 fL (ref 78.0–100.0)
Platelets: 167 10*3/uL (ref 150–400)
RBC: 4.11 MIL/uL (ref 3.87–5.11)
RDW: 13.6 % (ref 11.5–15.5)
WBC: 9 10*3/uL (ref 4.0–10.5)

## 2017-05-19 LAB — ABO/RH: ABO/RH(D): O POS

## 2017-05-19 LAB — BASIC METABOLIC PANEL
Anion gap: 7 (ref 5–15)
BUN: 22 mg/dL — ABNORMAL HIGH (ref 6–20)
CO2: 27 mmol/L (ref 22–32)
Calcium: 8.8 mg/dL — ABNORMAL LOW (ref 8.9–10.3)
Chloride: 106 mmol/L (ref 101–111)
Creatinine, Ser: 1.07 mg/dL — ABNORMAL HIGH (ref 0.44–1.00)
GFR calc Af Amer: 52 mL/min — ABNORMAL LOW (ref >60)
GFR calc non Af Amer: 45 mL/min — ABNORMAL LOW (ref >60)
Glucose, Bld: 132 mg/dL — ABNORMAL HIGH (ref 65–99)
Potassium: 4.2 mmol/L (ref 3.5–5.1)
Sodium: 140 mmol/L (ref 135–145)

## 2017-05-19 LAB — GLUCOSE, CAPILLARY
Glucose-Capillary: 207 mg/dL — ABNORMAL HIGH (ref 65–99)
Glucose-Capillary: 114 mg/dL — ABNORMAL HIGH (ref 65–99)
Glucose-Capillary: 127 mg/dL — ABNORMAL HIGH (ref 65–99)

## 2017-05-19 LAB — TYPE AND SCREEN
ABO/RH(D): O POS
Antibody Screen: NEGATIVE

## 2017-05-19 LAB — HEMOGLOBIN A1C
Hgb A1c MFr Bld: 6.6 % — ABNORMAL HIGH (ref 4.8–5.6)
Mean Plasma Glucose: 142.72 mg/dL

## 2017-05-19 LAB — ALBUMIN: Albumin: 3.4 g/dL — ABNORMAL LOW (ref 3.5–5.0)

## 2017-05-19 MED ORDER — METOPROLOL TARTRATE 100 MG PO TABS
100.0000 mg | ORAL_TABLET | Freq: Once | ORAL | Status: AC
  Administered 2017-05-19: 100 mg via ORAL
  Filled 2017-05-19: qty 1

## 2017-05-19 MED ORDER — ASPIRIN EC 81 MG PO TBEC
81.0000 mg | DELAYED_RELEASE_TABLET | Freq: Every day | ORAL | Status: DC
  Administered 2017-05-19 – 2017-05-20 (×2): 81 mg via ORAL
  Filled 2017-05-19 (×2): qty 1

## 2017-05-19 MED ORDER — KETOROLAC TROMETHAMINE 15 MG/ML IJ SOLN
15.0000 mg | Freq: Once | INTRAMUSCULAR | Status: AC
  Administered 2017-05-19: 15 mg via INTRAVENOUS
  Filled 2017-05-19: qty 1

## 2017-05-19 NOTE — Progress Notes (Signed)
Ortho tech stated they do not place trapeze bars in rooms of older patients due to injuries it may cause because of decreased upper body strength. Ortho tech stated that they will not complete order.

## 2017-05-19 NOTE — Progress Notes (Signed)
Refused CBG at 0800

## 2017-05-19 NOTE — Progress Notes (Signed)
Pt. transported from ER via stretcher  To 5N- 16; and staff moved pt. from stretcher to bed; pt. Didn't want to be moved; move went smooth and swiftly. Daughter with pt.; pt. anxious and somewhat snappy when speaking. Unable to view skin on bottom/buttocks much- pt. wouldn't turn due to (R) pelvis/hip. Pt. didn't want to be bothered much.

## 2017-05-19 NOTE — Progress Notes (Signed)
PTA medications ordered per Dr. Clementeen Graham

## 2017-05-19 NOTE — Social Work (Signed)
CSW confirmed that patient will return to [place] side when ready, per Butch Penny at [place].  CSW will f/u with patient at appropriate time.  CSW will f/u for disposition.  Elissa Hefty, LCSW Clinical Social Worker [phone]

## 2017-05-19 NOTE — Progress Notes (Signed)
Assisted patient to [facility], stand and pivot with 2 person assist. Patient tolerated well with verbal cues. Repositioned in bed. Patient alert and oriented, with c/o bilateral foot pains. MD aware.

## 2017-05-19 NOTE — Plan of Care (Signed)
  Problem: Education: Goal: Knowledge of General Education information will improve Outcome: Progressing Note:  POC and orders reviewed with pt.   

## 2017-05-19 NOTE — Progress Notes (Addendum)
PROGRESS NOTE                                                                                                                                                                                                             Patient Demographics:    Kelsey Salinas, is a 84 y.o. female, DOB - May 30, 1926, EHO:[id number]  Admit date - 05/18/2017   Admitting Physician Toy Baker, MD  Outpatient Primary MD for the patient is Gayland Curry, DO  LOS - 1  Outpatient Specialists: Dr Maureen Ralphs  Chief Complaint  Patient presents with  . Fall       Brief Narrative   84 year old female resident of [facility] with history of hip replacement, hypertension, hypothyroidism, CAD and chronic diastolic CHF presented with mechanical fall after she tripped over a wheel of her walker and landed on her hip.  Patient found to have superior and inferior pubic ramus fracture.  Admitted for pain control, PT and orthopedics evaluation.    Subjective:   Complains of pain in her hips on movement.  Also has chronic bilateral knee pain.   Assessment  & Plan :   Principal problem Superior and inferior pubic ramus fracture Stable without displacement.  Pain control with PRN oxycodone and Robaxin.  Low-dose morphine for severe pain.  Seen by orthopedics and recommends nonoperative management, mobilization with PT.   Possibly needs to be sent to the skilled care upon discharge.  Essential hypertension Resume Toprol-XL.  Will hold HCTZ-lisinopril given mildly worsened renal function.  Chronic kidney disease stage II Renal function at baseline.  Hold HCTZ-lisinopril.  Gentle IV hydration.  Hyperlipidemia Resume statin.  Chronic diastolic CHF Euvolemic.  On gentle hydration.  Continue aspirin, beta-blocker and statin.  Hypokalemia Replenished  Right hip hematoma/contusion Seen on CT.  Hemoglobin stable.  Monitor while on  aspirin and for DVT prophylaxis.  Code Status : Full code  Family Communication  : None at bedside  Disposition Plan  : Return to independent living with home health versus SNF, possibly tomorrow better control.  Barriers For Discharge : Active symptoms  Consults  : [facility] (Dr. Alma Friendly)  Procedures  : None  DVT Prophylaxis  : Subcu Lovenox  Lab Results  Component Value Date   PLT 167 05/19/2017    Antibiotics  :   Anti-infectives (From admission,  onward)   None        Objective:   Vitals:   05/18/17 2245 05/18/17 2330 05/19/17 0102 05/19/17 0603  BP: 120/84 115/71 133/77 (!) 143/80  Pulse: 98 100 98 (!) 105  Resp: 18 17 17 16   Temp:   97.8 F (36.6 C) 97.8 F (36.6 C)  TempSrc:   Oral Oral  SpO2: 93%  95% 94%  Weight:      Height:        Wt Readings from Last 3 Encounters:  05/18/17 77.1 kg (170 lb)  03/16/17 80.9 kg (178 lb 6.4 oz)  03/09/17 80.7 kg (178 lb)    No intake or output data in the 24 hours ending 05/19/17 0927   Physical Exam  Gen: not in distress HEENT:, moist mucosa, supple neck Chest: clear b/l, no added sounds CVS: N S1&S2, no murmurs,  GI: soft, NT, ND,  Musculoskeletal: warm, no edema, limited mobility of the hip due to pain    Data Review:    CBC Recent Labs  Lab 05/18/17 1900 05/18/17 1918 05/19/17 0434  WBC 13.9*  --  9.0  HGB 13.5 13.6 12.3  HCT 42.6 40.0 39.4  PLT 169  --  167  MCV 95.7  --  95.9  MCH 30.3  --  29.9  MCHC 31.7  --  31.2  RDW 13.4  --  13.6  LYMPHSABS 2.1  --   --   MONOABS 0.8  --   --   EOSABS 0.1  --   --   BASOSABS 0.0  --   --     Chemistries  Recent Labs  Lab 05/18/17 1900 05/18/17 1918 05/19/17 0434  NA 140 141 140  K 3.3* 3.3* 4.2  CL 103 103 106  CO2 26  --  27  GLUCOSE 108* 103* 132*  BUN 20 23* 22*  CREATININE 1.05* 1.00 1.07*  CALCIUM 8.9  --  8.8*  MG 1.8  --   --     ------------------------------------------------------------------------------------------------------------------ No results for input(s): CHOL, HDL, LDLCALC, TRIG, CHOLHDL, LDLDIRECT in the last 72 hours.  Lab Results  Component Value Date   HGBA1C 6.6 (H) 05/19/2017   ------------------------------------------------------------------------------------------------------------------ No results for input(s): TSH, T4TOTAL, T3FREE, THYROIDAB in the last 72 hours.  Invalid input(s): FREET3 ------------------------------------------------------------------------------------------------------------------ No results for input(s): VITAMINB12, FOLATE, FERRITIN, TIBC, IRON, RETICCTPCT in the last 72 hours.  Coagulation profile No results for input(s): INR, PROTIME in the last 168 hours.  No results for input(s): DDIMER in the last 72 hours.  Cardiac Enzymes No results for input(s): CKMB, TROPONINI, MYOGLOBIN in the last 168 hours.  Invalid input(s): CK ------------------------------------------------------------------------------------------------------------------ No results found for: BNP  Inpatient Medications  Scheduled Meds: . aspirin EC  81 mg Oral Daily  . atorvastatin  10 mg Oral Daily  . insulin aspart  0-5 Units Subcutaneous QHS  . insulin aspart  0-9 Units Subcutaneous TID WC  . metoprolol succinate  100 mg Oral Daily   Continuous Infusions: . methocarbamol (ROBAXIN)  IV     PRN Meds:.bisacodyl, methocarbamol **OR** methocarbamol (ROBAXIN)  IV, morphine injection, oxyCODONE, polyethylene glycol  Micro Results No results found for this or any previous visit (from the past 240 hour(s)).  Radiology Reports Ct Pelvis W Contrast  Result Date: 05/18/2017 CLINICAL DATA:  84 y/o F; fall on right side. Right hip replacement 2010. EXAM: CT PELVIS WITH CONTRAST TECHNIQUE: Multidetector CT imaging of the pelvis was performed using the standard protocol following the bolus  administration of intravenous contrast. CONTRAST:  150mL OMNIPAQUE IOHEXOL 300 MG/ML  SOLN COMPARISON:  03/04/2008 pelvis radiographs. FINDINGS: Urinary Tract:  No abnormality visualized. Bowel:  Unremarkable visualized pelvic bowel loops. Vascular/Lymphatic: Calcific atherosclerosis of the aorta. Reproductive:  Calcified uterine myomas measuring up to 21 mm. Other: Ill-defined contusion/hematoma in the soft tissues overlying the right hip with a blush contrast extravasation indicating active hemorrhage. Musculoskeletal: Acute comminuted and minimally displaced fractures of the right superior and inferior pubic rami. Chronic fracture deformity of the right posterior inferior pubic rami. Minimally displaced acute fractures at the margins of left pubic symphysis. Nondisplaced fracture of the right iliac crest. 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 IMPRESSION: 1. Ill-defined contusion/hematoma in the soft tissues overlying the right hip. Blush of contrast indicates active hemorrhage. 2. Acute comminuted and mildly displaced fractures of the right superior and inferior pubic rami. 3. Minimally displaced acute fractures of the margins of left pubic symphysis. 4. Nondisplaced acute fracture of the right iliac crest. 5. 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 6. Aortic atherosclerosis. 7. Myomatous uterus. These results were called by telephone at the time of interpretation on 05/18/2017 at 8:54 pm to Dr. Deno Etienne , who verbally acknowledged these results. Electronically Signed   By: Kristine Garbe M.D.   On: 05/18/2017 20:56   Dg Hip Unilat W Or Wo Pelvis 2-3 Views Right  Result Date: 05/18/2017 CLINICAL DATA:  Fall with right hip pain. Right hip replacement 7 years ago. EXAM: DG HIP (WITH OR WITHOUT PELVIS) 2-3V RIGHT COMPARISON:  02/28/2008 FINDINGS: Right hip arthroplasty intact. Moderate osteoarthritic change of the left  hip. Old right inferior pubic ramus fracture. Acute displaced fracture involving the right superior pubic ramus extending to the symphysis. Degenerate change of the spine. IMPRESSION: Acute displaced fracture of the right superior pubic ramus extending to the symphysis. Electronically Signed   By: Marin Olp M.D.   On: 05/18/2017 18:49    Time Spent in minutes  25   Jona Zappone M.D on 05/19/2017 at 9:27 AM  Between 7am to 7pm - Pager - [phone]  After 7pm go to [email or web address] - password [facility]  [facility] -  Office  [phone]

## 2017-05-19 NOTE — ED Notes (Signed)
Per 5N, nurse has now arrived and pt may be transported.

## 2017-05-19 NOTE — Progress Notes (Signed)
One time dose ordered for metoprolol due to patients heart rate. MD will re-evaluate in the morning. Patient and family member (daughter-Donna) are aware of situation, and verbally understood. Will continue to monitor

## 2017-05-19 NOTE — Evaluation (Signed)
Physical Therapy Evaluation Patient Details Name: Kelsey Salinas MRN: [id number] DOB: 10/02/1926 Today's Date: 05/19/2017   History of Present Illness  Pt. is a 84 y.o. F with significant PMH of hip replacement, diverticulosis, HUD, HTN, hypothyroidism, tachycardia, CAD, chronic diastolic CHF who presents following a mechanical fall with acute comminuted and mildly displaced fractures of right superior and inferior pubic rami,minimally displaced acute fractures of margins of left pubic symphysis, and nondisplaced acute fracture of the right iliac crest.   Clinical Impression  Pt admitted with above diagnosis. Pt currently with functional limitations due to the deficits listed below (see PT Problem List). Patient evaluation limited by pain; interestingly patient states pain is along right 2nd-5th toes and is tender to palpation. She reports even the sensation of the sock is uncomfortable. Patient is very specific about dictating terms of therapy session and prefers to do try moving RLE independently initially. Presenting with decreased functional mobility secondary to decreased RLE strength, range of motion, and pain. Requiring up to maximal assistance to stand initially but on the second attempt patient stood from bed to RW with minimal assistance. Refused to transfer to chair today but think patient will be able to progress to taking steps with encouragement and facilitation. Recommended nursing use Denna Haggard if patient wishes to transfer to chair later today. Recommending SNF at [facility] (where patient resides) to maximize functional independence and decrease caregiver burden.Pt will benefit from skilled PT to increase their independence and safety with mobility.    Follow Up Recommendations SNF;Other (comment)(SNF at [facility])    Equipment Recommendations  None recommended by PT    Recommendations for Other Services       Precautions / Restrictions Precautions Precautions:  Fall Restrictions Weight Bearing Restrictions: Yes RLE Weight Bearing: Weight bearing as tolerated LLE Weight Bearing: Weight bearing as tolerated      Mobility  Bed Mobility Overal bed mobility: Needs Assistance Bed Mobility: Supine to Sit;Sit to Supine     Supine to sit: Supervision Sit to supine: Min assist   General bed mobility comments: Requires increased time to progress RLE to EOB with use of upper extremities but able to do so without additional assistance. Sit to supine with minA for RLE management.  Transfers Overall transfer level: Needs assistance Equipment used: Rolling walker (2 wheeled) Transfers: Sit to/from Stand Sit to Stand: Min assist;Max assist         General transfer comment: Patient requiring max assistance + 2 on initial attempt to stand due to posterior lean and active resistance. However, on second attempt, able to transition with minimal assistance. Max verbal and tactile cueing for hand/foot placement.   Ambulation/Gait                Stairs            Wheelchair Mobility    Modified Rankin (Stroke Patients Only)       Balance Overall balance assessment: Needs assistance Sitting-balance support: No upper extremity supported;Feet supported Sitting balance-Leahy Scale: Good       Standing balance-Leahy Scale: Poor Standing balance comment: reliant on UE support                             Pertinent Vitals/Pain Pain Assessment: Faces Faces Pain Scale: Hurts even more Pain Location: "toes" Pain Descriptors / Indicators: Grimacing;Moaning Pain Intervention(s): Limited activity within patient's tolerance;Monitored during session    Home Living Family/patient expects to be discharged to:: Private  residence Living Arrangements: Alone Available Help at Discharge: Family Type of Home: Independent living facility([facility]) Home Access: Level entry     [place]: One [place]: [place] -  built in;Grab bars - tub/shower;Walker - 4 wheels;Walker - 2 wheels      Prior Function Level of Independence: Independent with assistive device(s)         Comments: Uses [employer] for community and RW for household.      Hand Dominance        Extremity/Trunk Assessment   Upper Extremity Assessment Upper Extremity Assessment: Overall WFL for tasks assessed    Lower Extremity Assessment Lower Extremity Assessment: RLE deficits/detail;LLE deficits/detail RLE Deficits / Details: Difficult to assess due to pain. Resting with hip internally rotated. Able to perform ankle DF/PF [facility] and quad set. Not able to perform heel slide or SLR. Reported numbness on right dorsal foot. LLE Deficits / Details: Difficult to assess due to pain. Ankle DF/PF WFL, able to perform heel slide and limited SLR       Communication   Communication: No difficulties  Cognition Arousal/Alertness: Awake/alert Behavior During Therapy: WFL for tasks assessed/performed Overall Cognitive Status: Within Functional Limits for tasks assessed                                        General Comments General comments (skin integrity, edema, etc.): patient daughter present throughout    Exercises     Assessment/Plan    PT Assessment Patient needs continued PT services  PT Problem List Decreased strength;Decreased range of motion;Decreased activity tolerance;Decreased balance;Decreased mobility;Pain       PT Treatment Interventions DME instruction;Gait training;Functional mobility training;Therapeutic activities;Therapeutic exercise;Balance training;Patient/family education    PT Goals (Current goals can be found in the Care Plan section)  Acute Rehab PT Goals Patient Stated Goal: decrease pain PT Goal Formulation: With patient Time For Goal Achievement: 06/02/17 Potential to Achieve Goals: Good    Frequency Min 3X/week   Barriers to discharge        Co-evaluation                AM-PAC PT "6 Clicks" Daily Activity  Outcome Measure Difficulty turning over in bed (including adjusting bedclothes, sheets and blankets)?: A Lot Difficulty moving from lying on back to sitting on the side of the bed? : A Lot Difficulty sitting down on and standing up from a chair with arms (e.g., wheelchair, bedside commode, etc,.)?: Unable Help needed moving to and from a bed to chair (including a wheelchair)?: A Little Help needed walking in hospital room?: A Lot Help needed climbing 3-5 steps with a railing? : Total 6 Click Score: 11    End of Session Equipment Utilized During Treatment: Gait belt Activity Tolerance: Patient limited by pain Patient left: in bed;with call bell/phone within reach;with family/visitor present;with bed alarm set Nurse Communication: Mobility status PT Visit Diagnosis: Muscle weakness (generalized) (M62.81);Pain;Difficulty in walking, not elsewhere classified (R26.2);Other abnormalities of gait and mobility (R26.89) Pain - Right/Left: Right Pain - part of body: Leg    Time: 4315-4008 PT Time Calculation (min) (ACUTE ONLY): 28 min   Charges:   PT Evaluation $PT Eval Moderate Complexity: 1 Mod PT Treatments $Therapeutic Activity: 8-22 mins   PT G Codes:        Ellamae Sia, PT, DPT Acute Rehabilitation Services  Pager: [place] 05/19/2017, 2:20  PM   

## 2017-05-19 NOTE — Progress Notes (Signed)
Text paged Dr. Clementeen Graham about patients PTA medications. Awaiting response.

## 2017-05-19 NOTE — Progress Notes (Signed)
Dr. Clementeen Graham text paged due to pt c/o bilateral toes and feet hurting. No relief from oral pain medications. Did not ambulate well with PT. Will not ambulate as long as toes hurt. Pt states they are not use to being bed bound. RN educated patient on wanting her to ambulate and be mobile and the benefits from doing so. Pt requested for RN to contact doctor and see what can be done. RN contacted Dr. Clementeen Graham, ordered additional pain medication and stated to continue to educate patient on mobility. Educated pt, no change in status. Will continue to monitor.

## 2017-05-19 NOTE — Progress Notes (Signed)
Orthopedics Progress Note  Subjective: Patient complaining of right "leg" pain  Objective:  Vitals:   05/19/17 0102 05/19/17 0603  BP: 133/77 (!) 143/80  Pulse: 98 (!) 105  Resp: 17 16  Temp: 97.8 F (36.6 C) 97.8 F (36.6 C)  SpO2: 95% 94%    General: Awake and alert  Musculoskeletal: bilateral LEs with normal pulses and sensation and able to do ankle pumps without pain. Min pain with gentle log roll on right Compartments supple Neurovascularly intact  Lab Results  Component Value Date   WBC 9.0 05/19/2017   HGB 12.3 05/19/2017   HCT 39.4 05/19/2017   MCV 95.9 05/19/2017   PLT 167 05/19/2017       Component Value Date/Time   NA 140 05/19/2017 0434   NA 142 06/01/2016   K 4.2 05/19/2017 0434   CL 106 05/19/2017 0434   CO2 27 05/19/2017 0434   GLUCOSE 132 (H) 05/19/2017 0434   BUN 22 (H) 05/19/2017 0434   BUN 22 (A) 06/01/2016   CREATININE 1.07 (H) 05/19/2017 0434   CALCIUM 8.8 (L) 05/19/2017 0434   GFRNONAA 45 (L) 05/19/2017 0434   GFRAA 52 (L) 05/19/2017 0434    Lab Results  Component Value Date   INR 2.1 (H) 03/09/2008   INR 2.4 (H) 03/08/2008   INR 2.0 (H) 03/07/2008    Assessment/Plan: S/p fall with right sup/inf pubic ramus fractures - stable pelvis fracture - no operative care planned DVT prophylaxis and decubitus precautions Patient lives at [facility] so has access to skilled care - d/c planning PT, OT mobilization   [employer]. Veverly Fells, MD 05/19/2017 7:46 AM

## 2017-05-19 NOTE — Progress Notes (Signed)
Text paged Dr. Clementeen Graham about patients heart rate. Awaiting response

## 2017-05-20 DIAGNOSIS — S32591D Other specified fracture of right pubis, subsequent encounter for fracture with routine healing: Secondary | ICD-10-CM

## 2017-05-20 DIAGNOSIS — E876 Hypokalemia: Secondary | ICD-10-CM

## 2017-05-20 DIAGNOSIS — R000 Tachycardia, unspecified: Secondary | ICD-10-CM

## 2017-05-20 LAB — CBC
HCT: 35.9 % — ABNORMAL LOW (ref 36.0–46.0)
Hemoglobin: 11.2 g/dL — ABNORMAL LOW (ref 12.0–15.0)
MCH: 30 pg (ref 26.0–34.0)
MCHC: 31.2 g/dL (ref 30.0–36.0)
MCV: 96.2 fL (ref 78.0–100.0)
Platelets: 130 10*3/uL — ABNORMAL LOW (ref 150–400)
RBC: 3.73 MIL/uL — ABNORMAL LOW (ref 3.87–5.11)
RDW: 13.7 % (ref 11.5–15.5)
WBC: 10 10*3/uL (ref 4.0–10.5)

## 2017-05-20 LAB — GLUCOSE, CAPILLARY
Glucose-Capillary: 115 mg/dL — ABNORMAL HIGH (ref 65–99)
Glucose-Capillary: 132 mg/dL — ABNORMAL HIGH (ref 65–99)

## 2017-05-20 LAB — VITAMIN D 25 HYDROXY (VIT D DEFICIENCY, FRACTURES): Vit D, 25-Hydroxy: 32 ng/mL (ref 30.0–100.0)

## 2017-05-20 MED ORDER — METOPROLOL TARTRATE 50 MG PO TABS
150.0000 mg | ORAL_TABLET | Freq: Two times a day (BID) | ORAL | Status: DC
  Administered 2017-05-20: 150 mg via ORAL
  Filled 2017-05-20: qty 1

## 2017-05-20 MED ORDER — METHOCARBAMOL 500 MG PO TABS: 500.0000 mg | ORAL_TABLET | Freq: Three times a day (TID) | ORAL | 0 refills | Status: DC | PRN

## 2017-05-20 MED ORDER — POLYETHYLENE GLYCOL 3350 17 G PO PACK: 17.0000 g | PACK | Freq: Every day | ORAL | 0 refills | Status: DC | PRN

## 2017-05-20 MED ORDER — OXYCODONE HCL 5 MG PO TABS: 5.0000 mg | ORAL_TABLET | ORAL | 0 refills | Status: DC | PRN

## 2017-05-20 NOTE — Clinical Social Work Placement (Signed)
   CLINICAL SOCIAL WORK PLACEMENT  NOTE  Date:  05/20/2017  Patient Details  Name: Kelsey Salinas MRN: [id number] Date of Birth: 1926/05/15  Clinical Social Work is seeking post-discharge placement for this patient at the [place] level of care (*CSW will initial, date and re-position this form in  chart as items are completed):  Yes   Patient/family provided with [facility] Work Department's list of facilities offering this level of care within the geographic area requested by the patient (or if unable, by the patient's family).  Yes   Patient/family informed of their freedom to choose among providers that offer the needed level of care, that participate in Medicare, Medicaid or managed care program needed by the patient, have an available bed and are willing to accept the patient.      Patient/family informed of [facility]'s ownership interest in [facility] and [facility], as well as of the fact that they are under no obligation to receive care at these facilities.  PASRR submitted to EDS on       PASRR number received on 05/20/17     Existing PASRR number confirmed on       FL2 transmitted to all facilities in geographic area requested by pt/family on 05/20/17     FL2 transmitted to all facilities within larger geographic area on       Patient informed that his/her managed care company has contracts with or will negotiate with certain facilities, including the following:        Yes   Patient/family informed of bed offers received.  Patient chooses bed at [facility]     Physician recommends and patient chooses bed at      Patient to be transferred to [facility] on 05/20/17.  Patient to be transferred to facility by [facility]     Patient family notified on 05/20/17 of transfer.  Name of family member notified:  daughter notified and at bedside     PHYSICIAN       Additional Comment:     _______________________________________________ Normajean Baxter, LCSW 05/20/2017, 10:28 AM

## 2017-05-20 NOTE — NC FL2 (Signed)
[place] LEVEL OF CARE SCREENING TOOL     IDENTIFICATION  Patient Name: Kelsey Salinas Birthdate: [birth year] Sex: female Admission Date (Current Location): 05/18/2017  [facility] and [place] Number:  Herbalist and Address:  [facility] [place], [place] [address] [place], [place] [address]      Provider Number: [id number]  Attending Physician Name and Address:  Louellen Molder, MD  Relative Name and Phone Number:  Winda Summerall, daughter, [phone]    Current Level of Care: Hospital Recommended Level of Care: [place] Prior Approval Number:    Date Approved/Denied:   PASRR Number: [id number]  Discharge Plan: SNF    Current Diagnoses: Patient Active Problem List   Diagnosis Date Noted  . Sinus tachycardia   . Multiple fractures of ramus of right pubis with routine healing   . Hypokalemia   . Leukocytosis 05/18/2017  . Closed pelvic fracture ([place]) 05/18/2017  . Pelvic fracture ([place]) 05/18/2017  . Bilateral primary osteoarthritis of knee 04/07/2016  . Hyperglycemia 10/01/2015  . Fracture of 5th metatarsal 02/14/2015  . CKD (chronic kidney disease) stage 3, GFR 30-59 ml/min (HCC) 12/10/2013  . Sebaceous cyst 12/10/2013  . Insomnia   . Colon polyp   . Diverticular stricture ([place])   . Osteopenia   . Vitamin D deficiency 10/31/2013  . Indigestion   . Primary osteoarthritis of hip   . Chronic diastolic heart failure ([place]) 12/25/2012  . Rapid resting heart rate 12/25/2012  . Essential hypertension 12/25/2012  . Hypercholesterolemia     Orientation RESPIRATION BLADDER Height & Weight     Self, Time, Situation, Place    Continent Weight: 170 lb (77.1 kg) Height:  5\' 7"  (170.2 cm)  BEHAVIORAL SYMPTOMS/MOOD NEUROLOGICAL BOWEL NUTRITION STATUS      Continent Diet(See DC Summary)  AMBULATORY STATUS COMMUNICATION OF NEEDS Skin   Extensive Assist Verbally Bruising, Other (Comment)(displaced fractures of right  superior and inferior pubic rami)                       Personal Care Assistance Level of Assistance  Dressing, Bathing, Feeding Bathing Assistance: Maximum assistance Feeding assistance: Limited assistance Dressing Assistance: Maximum assistance     Functional Limitations Info  Sight, Hearing, Speech Sight Info: Adequate Hearing Info: Adequate Speech Info: Adequate    SPECIAL CARE FACTORS FREQUENCY  PT (By licensed PT), OT (By licensed OT)     PT Frequency: 3x week OT Frequency: 3x week            Contractures      Additional Factors Info  Code Status, Allergies, Insulin Sliding Scale Code Status Info: Full Allergies Info: AMLODIPINE BESYLATE, LISINOPRIL, SULFAMETHOXAZOLE-TRIMETHOPRIM    Insulin Sliding Scale Info: insulin daily       Current Medications (05/20/2017):  This is the current hospital active medication list Current Facility-Administered Medications  Medication Dose Route Frequency Provider Last Rate Last Dose  . aspirin EC tablet 81 mg  81 mg Oral Daily Dhungel, Nishant, MD   81 mg at 05/20/17 0934  . atorvastatin (LIPITOR) tablet 10 mg  10 mg Oral Daily Toy Baker, MD   10 mg at 05/20/17 0933  . bisacodyl (DULCOLAX) suppository 10 mg  10 mg Rectal Daily PRN Doutova, Anastassia, MD      . insulin aspart (novoLOG) injection 0-5 Units  0-5 Units Subcutaneous QHS Toy Baker, MD   Stopped at 05/18/17 2243  . insulin aspart (novoLOG) injection 0-9 Units  0-9 Units Subcutaneous TID WC Toy Baker, MD   3 Units at 05/19/17 1219  . methocarbamol (ROBAXIN) tablet 500 mg  500 mg Oral Q6H PRN Toy Baker, MD   500 mg at 05/19/17 1758   Or  . methocarbamol (ROBAXIN) 500 mg in dextrose 5 % 50 mL IVPB  500 mg Intravenous Q6H PRN Doutova, Anastassia, MD      . metoprolol tartrate (LOPRESSOR) tablet 150 mg  150 mg Oral BID Dhungel, Nishant, MD   150 mg at 05/20/17 0933  . morphine 4 MG/ML injection 0.52 mg  0.52 mg Intravenous Q2H  PRN Toy Baker, MD   0.52 mg at 05/19/17 1336  . oxyCODONE (Oxy IR/ROXICODONE) immediate release tablet 5 mg  5 mg Oral Q4H PRN Toy Baker, MD   5 mg at 05/19/17 1758  . polyethylene glycol (MIRALAX / GLYCOLAX) packet 17 g  17 g Oral Daily PRN Toy Baker, MD   17 g at 05/19/17 1842     Discharge Medications: Please see discharge summary for a list of discharge medications.  Relevant Imaging Results:  Relevant Lab Results:   Additional Information SS#: [id number]  [place], LCSW

## 2017-05-20 NOTE — Social Work (Signed)
Clinical Social Worker facilitated patient discharge including contacting patient family and facility to confirm patient discharge plans.  Clinical information faxed to facility and family agreeable with plan.    CSW arranged ambulance transport via [facility] to [facility].    RN to call [phone] to give report prior to discharge.  Clinical Social Worker will sign off for now as social work intervention is no longer needed. Please consult [place] again if new need arises.  Elissa Hefty, LCSW Clinical Social Worker [phone]

## 2017-05-20 NOTE — Progress Notes (Signed)
[facility] transport at bedside with patient to escort to [place] rehab. Daughter Butch Penny) at bedside.

## 2017-05-20 NOTE — Discharge Summary (Signed)
Physician Discharge Summary  Kelsey Salinas IRS:[id number] DOB: [birth year] DOA: 05/18/2017  PCP: Gayland Curry, DO  Admit date: 05/18/2017 Discharge date: 05/20/2017  Admitted [facility]: [facility] Disposition: [facility] skilled nursing facility  Recommendations for Outpatient Follow-up:  1. Follow up with MD at SNF in 1 week 2. Follow-up with orthopedic surgeon in 2-3 weeks.   Therapy recommendation: Weightbearing as tolerated   Equipment/Devices: As per therapy at the facility  Discharge Condition: Fair CODE STATUS: Full code Diet recommendation: Low-sodium, carb modified   Discharge Diagnoses:  Principal Problem: Acute mildly displaced fractures of right superior and inferior pubic rami, with routine healing ([place])  Active Problems:   Hypercholesterolemia   Chronic diastolic heart failure (HCC)   Rapid resting heart rate   Essential hypertension   CKD (chronic kidney disease) stage 3, GFR 30-59 ml/min (HCC)   Leukocytosis   Brief narrative/HPI 84 year old female resident of [facility] with history of hip replacement, hypertension, hypothyroidism, CAD and chronic diastolic CHF presented with mechanical fall after she tripped over a wheel of her walker and landed on her hip.  Patient found to have superior and inferior pubic ramus fracture.  Admitted for pain control, PT and orthopedics evaluation.   Hospital course  Principal problem Acute mildly displaced fracture of her right superior and inferior pubic ramus (HCC)  Pain control with PRN oxycodone and Robaxin.  Tolerating well.   Seen by orthopedics and recommends nonoperative management, and weightbearing as tolerated in both lower extremities.  Added bowel regimen.   Seen by PT and recommends SNF.  Patient stable to be discharged. Outpatient follow-up with orthopedic surgeon.  Essential hypertension Resume HCTZ-lisinopril and high-dose Toprol XL.  Atrial tachycardia Sees  Dr. Tamala Julian and is on high-dose Toprol XL 150 mg twice daily.  24-hour Holter monitoring was planned by her cardiologist during her visit on 03/14/2017.  Chronic kidney disease stage II Renal function at baseline.    Received gentle hydration.  Hyperlipidemia Resume statin.  Chronic diastolic CHF Euvolemic.  Continue aspirin, beta-blocker and statin.  Hypokalemia Replenished  Right hip hematoma/contusion Seen on CT, associated with fracture and fall.  Hemoglobin stable.      Family Communication  :  Daughter at bedside  Disposition Plan  : Skilled nursing facility   Consults  : [facility] (Dr. Alma Friendly)  Procedures  :  CT right hip    Discharge Instructions   Allergies as of 05/20/2017      Reactions   Amlodipine Besylate Other (See Comments)   Edema   Lisinopril Cough   Cough   Sulfamethoxazole-trimethoprim Other (See Comments)   Unknown reaction      Medication List    TAKE these medications   alendronate 70 MG tablet Commonly known as:  FOSAMAX Take 70 mg by mouth once a week. Take with a full glass of water on an empty stomach.   aspirin EC 81 MG tablet Take 81 mg by mouth daily.   atorvastatin 10 MG tablet Commonly known as:  LIPITOR Take 10 mg by mouth daily. For cholesterol   diclofenac sodium 1 % Gel Commonly known as:  VOLTAREN Apply 2 g topically every 6 (six) hours as needed.   hydrocortisone 2.5 % cream Apply 1 application topically as needed.   losartan-hydrochlorothiazide 100-25 MG tablet Commonly known as:  HYZAAR Take 1 tablet by mouth daily.   metFORMIN 500 MG tablet Commonly known as:  GLUCOPHAGE TAKE (1/2) TABLET DAILY.   methocarbamol 500 MG tablet Commonly known  as:  ROBAXIN Take 1 tablet (500 mg total) by mouth every 8 (eight) hours as needed for muscle spasms.   metoprolol succinate 100 MG 24 hr tablet Commonly known as:  TOPROL-XL Take one and a half (1 and 1/2) tablets (150 mg) by mouth twice  daily.   OVER THE COUNTER MEDICATION Take 1 tablet by mouth daily. Med Name: ACID REDUCER   oxyCODONE 5 MG immediate release tablet Commonly known as:  Oxy IR/ROXICODONE Take 1 tablet (5 mg total) by mouth every 4 (four) hours as needed for moderate pain.   polyethylene glycol packet Commonly known as:  MIRALAX / GLYCOLAX Take 17 g by mouth daily as needed for mild constipation.   VITAMIN D PO Take 1 tablet by mouth every 7 (seven) days.      Follow-up Information    Reed, Tiffany L, DO Follow up in 1 week(s).   Specialty:  Geriatric Medicine Contact information: [place]. [place] [address] [phone]        Belva Crome, MD .   Specialty:  Cardiology Contact information: [phone] [place]  [address] [phone]          Allergies  Allergen Reactions  . Amlodipine Besylate Other (See Comments)    Edema  . Lisinopril Cough    Cough   . Sulfamethoxazole-Trimethoprim Other (See Comments)    Unknown reaction       Procedures/Studies: Ct Pelvis W Contrast  Result Date: 05/18/2017 CLINICAL DATA:  84 y/o F; fall on right side. Right hip replacement 2010. EXAM: CT PELVIS WITH CONTRAST TECHNIQUE: Multidetector CT imaging of the pelvis was performed using the standard protocol following the bolus administration of intravenous contrast. CONTRAST:  130mL OMNIPAQUE IOHEXOL 300 MG/ML  SOLN COMPARISON:  03/04/2008 pelvis radiographs. FINDINGS: Urinary Tract:  No abnormality visualized. Bowel:  Unremarkable visualized pelvic bowel loops. Vascular/Lymphatic: Calcific atherosclerosis of the aorta. Reproductive:  Calcified uterine myomas measuring up to 21 mm. Other: Ill-defined contusion/hematoma in the soft tissues overlying the right hip with a blush contrast extravasation indicating active hemorrhage. Musculoskeletal: Acute comminuted and minimally displaced fractures of the right superior and inferior pubic rami. Chronic fracture  deformity of the right posterior inferior pubic rami. Minimally displaced acute fractures at the margins of left pubic symphysis. Nondisplaced fracture of the right iliac crest. 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 IMPRESSION: 1. Ill-defined contusion/hematoma in the soft tissues overlying the right hip. Blush of contrast indicates active hemorrhage. 2. Acute comminuted and mildly displaced fractures of the right superior and inferior pubic rami. 3. Minimally displaced acute fractures of the margins of left pubic symphysis. 4. Nondisplaced acute fracture of the right iliac crest. 5. 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 6. Aortic atherosclerosis. 7. Myomatous uterus. These results were called by telephone at the time of interpretation on 05/18/2017 at 8:54 pm to Dr. Deno Etienne , who verbally acknowledged these results. Electronically Signed   By: Kristine Garbe M.D.   On: 05/18/2017 20:56   Dg Hip Unilat W Or Wo Pelvis 2-3 Views Right  Result Date: 05/18/2017 CLINICAL DATA:  Fall with right hip pain. Right hip replacement 7 years ago. EXAM: DG HIP (WITH OR WITHOUT PELVIS) 2-3V RIGHT COMPARISON:  02/28/2008 FINDINGS: Right hip arthroplasty intact. Moderate osteoarthritic change of the left hip. Old right inferior pubic ramus fracture. Acute displaced fracture involving the right superior pubic ramus extending to the  symphysis. Degenerate change of the spine. IMPRESSION: Acute displaced fracture of the right superior pubic ramus extending to the symphysis. Electronically Signed   By: Marin Olp M.D.   On: 05/18/2017 18:49       Subjective: Hip pain much better.  Participated with therapy yesterday.  Discharge Exam: Vitals:   05/20/17 0645 05/20/17 0900  BP: 133/88 131/80  Pulse: (!) 109 (!) 121  Resp: 17   Temp: (!) 97.4 F (36.3 C) 98.7 F (37.1 C)  SpO2: 95% 95%   Vitals:   05/19/17 1459 05/19/17  2054 05/20/17 0645 05/20/17 0900  BP: 129/88 132/79 133/88 131/80  Pulse: (!) 106 97 (!) 109 (!) 121  Resp: 17 18 17    Temp: 98.5 F (36.9 C) 98.8 F (37.1 C) (!) 97.4 F (36.3 C) 98.7 F (37.1 C)  TempSrc: Oral Oral Oral Oral  SpO2: 94% 95% 95% 95%  Weight:      Height:        General: Elderly pleasant female not in distress HEENT: Moist mucosa, supple neck Chest: Clear bilaterally CVs: Normal S1 and S2, no murmurs GI: Soft, nondistended, nontender Musculoskeletal: Warm, no edema, improved mobility of the right hip    The results of significant diagnostics from this hospitalization (including imaging, microbiology, ancillary and laboratory) are listed below for reference.     Microbiology: No results found for this or any previous visit (from the past 240 hour(s)).   Labs: BNP (last 3 results) No results for input(s): BNP in the last 8760 hours. Basic Metabolic Panel: Recent Labs  Lab 05/18/17 1900 05/18/17 1918 05/19/17 0434  NA 140 141 140  K 3.3* 3.3* 4.2  CL 103 103 106  CO2 26  --  27  GLUCOSE 108* 103* 132*  BUN 20 23* 22*  CREATININE 1.05* 1.00 1.07*  CALCIUM 8.9  --  8.8*  MG 1.8  --   --    Liver Function Tests: Recent Labs  Lab 05/19/17 0434  ALBUMIN 3.4*   No results for input(s): LIPASE, AMYLASE in the last 168 hours. No results for input(s): AMMONIA in the last 168 hours. CBC: Recent Labs  Lab 05/18/17 1900 05/18/17 1918 05/19/17 0434 05/20/17 0450  WBC 13.9*  --  9.0 10.0  NEUTROABS 10.9*  --   --   --   HGB 13.5 13.6 12.3 11.2*  HCT 42.6 40.0 39.4 35.9*  MCV 95.7  --  95.9 96.2  PLT 169  --  167 130*   Cardiac Enzymes: No results for input(s): CKTOTAL, CKMB, CKMBINDEX, TROPONINI in the last 168 hours. BNP: Invalid input(s): POCBNP CBG: Recent Labs  Lab 05/18/17 2241 05/19/17 1141 05/19/17 1644 05/19/17 2205 05/20/17 0643  GLUCAP 164* 207* 114* 127* 115*   D-Dimer No results for input(s): DDIMER in the last 72  hours. Hgb A1c Recent Labs    05/19/17 0434  HGBA1C 6.6*   Lipid Profile No results for input(s): CHOL, HDL, LDLCALC, TRIG, CHOLHDL, LDLDIRECT in the last 72 hours. Thyroid function studies No results for input(s): TSH, T4TOTAL, T3FREE, THYROIDAB in the last 72 hours.  Invalid input(s): FREET3 Anemia work up No results for input(s): VITAMINB12, FOLATE, FERRITIN, TIBC, IRON, RETICCTPCT in the last 72 hours. Urinalysis    Component Value Date/Time   COLORURINE YELLOW 05/19/2017 0949   APPEARANCEUR CLEAR 05/19/2017 0949   LABSPEC 1.031 (H) 05/19/2017 0949   PHURINE 5.0 05/19/2017 [place] 05/19/2017 0949   HGBUR NEGATIVE 05/19/2017 8413  BILIRUBINUR NEGATIVE 05/19/2017 [place] 05/19/2017 0949   PROTEINUR NEGATIVE 05/19/2017 0949   UROBILINOGEN 0.2 03/05/2008 0957   NITRITE POSITIVE (A) 05/19/2017 0949   LEUKOCYTESUR NEGATIVE 05/19/2017 0949   Sepsis Labs Invalid input(s): PROCALCITONIN,  WBC,  LACTICIDVEN Microbiology No results found for this or any previous visit (from the past 240 hour(s)).   Time coordinating discharge: <30 minutes  SIGNED:   Louellen Molder, MD  [facility] 05/20/2017, 9:54 AM Pager   If 7PM-7AM, please contact night-coverage [email or web address] Password [email or web address]

## 2017-05-20 NOTE — Progress Notes (Signed)
Telephone report given to Teacher, adult education at [facility].

## 2017-05-20 NOTE — Progress Notes (Signed)
Orthopedics Progress Note  Subjective: Feeling much better today. Mobilization with some pain medication, ready for transfer to [facility] rehab.  Objective:  Vitals:   05/20/17 0645 05/20/17 0900  BP: 133/88 131/80  Pulse: (!) 109 (!) 121  Resp: 17   Temp: (!) 97.4 F (36.3 C) 98.7 F (37.1 C)  SpO2: 95% 95%    General: Awake and alert  Musculoskeletal: Bilateral LEs with good ankle pumps and min swelling Neurovascularly intact  Lab Results  Component Value Date   WBC 10.0 05/20/2017   HGB 11.2 (L) 05/20/2017   HCT 35.9 (L) 05/20/2017   MCV 96.2 05/20/2017   PLT 130 (L) 05/20/2017       Component Value Date/Time   NA 140 05/19/2017 0434   NA 142 06/01/2016   K 4.2 05/19/2017 0434   CL 106 05/19/2017 0434   CO2 27 05/19/2017 0434   GLUCOSE 132 (H) 05/19/2017 0434   BUN 22 (H) 05/19/2017 0434   BUN 22 (A) 06/01/2016   CREATININE 1.07 (H) 05/19/2017 0434   CALCIUM 8.8 (L) 05/19/2017 0434   GFRNONAA 45 (L) 05/19/2017 0434   GFRAA 52 (L) 05/19/2017 0434    Lab Results  Component Value Date   INR 2.1 (H) 03/09/2008   INR 2.4 (H) 03/08/2008   INR 2.0 (H) 03/07/2008    Assessment/Plan:  s/p right stable pelvic fracture after ground level fall. Continue mobilization with therapy working on strength and balance Follow up with ortho in two to three weeks   Remo Lipps R. Veverly Fells, MD 05/20/2017 10:24 AM

## 2017-05-20 NOTE — Discharge Instructions (Signed)
ORTHO    Simple Pelvic Fracture, Adult A pelvic fracture is a break in one of the pelvic bones. The pelvic bones include the bones that you sit on and the bones that make up the lower part of your spine. A pelvic fracture is called simple if the broken bones are stable and are not moving out of place. What are the causes? Common causes of this type of fracture include:  A fall.  A car accident.  Force or pressure applied to the pelvis.  What increases the risk? You may be at higher risk for this type of fracture if:  You play high-impact sports.  You are an older person with a condition that causes weak bones (osteoporosis).  You have a bone-weakening disease.  What are the signs or symptoms? Signs and symptoms may include:  Tenderness, swelling, or bruising in the affected area.  Pain when moving the hip.  Pain when walking or standing.  How is this diagnosed? A diagnosis is made with a physical exam and X-rays. Sometimes, a CT scan is also done. How is this treated? The goal of treatment is to get the bones to heal in a good position. Treatment of a simple pelvic fracture usually involves staying in bed (bed rest) and using crutches or a walker until the bones heal. Medicines may be prescribed for pain. Medicines may also be prescribed that help to prevent blood clots from forming in the legs. Follow these instructions at home: Managing pain, stiffness, and swelling  If directed, apply ice to the injured area: ? Put ice in a plastic bag. ? Place a towel between your skin and the bag. ? Leave the ice on for 20 minutes, 2-3 times a day.  Raise the injured area above the level of your heart while you are sitting or lying down. Driving  Do not  drive or operate heavy machinery until your health care provider tells you it is safe to do. Activity  Stay on bed rest for as long as directed by your health care provider.  While on bed rest: ? Change the position of your  legs every 1-2 hours. This keeps blood moving well through both of your legs. ? You may sit for as long as you feel comfortable.  After bed rest: ? Avoid strenuous activities for as long as directed by your health care provider. ? Return to your normal activities as directed by your health care provider. Ask your health care provider what activities are safe for you. Safety  Do not use the injured limb to support your body weight until your health care provider says that you can. Use crutches or a walker as directed by your health care provider. General instructions  Do not use any tobacco products, including cigarettes, chewing tobacco, or electronic cigarettes. Tobacco can delay bone healing. If you need help quitting, ask your health care provider.  Take medicines only as directed by your health care provider.  Keep all follow-up visits as directed by your health care provider. This is important. Contact a health care provider if:  Your pain gets worse.  Your pain is not relieved with medicines. Get help right away if:  You feel light-headed or faint.  You develop chest pain.  You develop shortness of breath.  You have a fever.  You have blood in your urine or your stools.  You have vaginal bleeding.  You have difficulty or pain with urination or with a bowel movement.  You have  difficulty or increased pain with walking.  You have new or increased swelling in one of your legs.  You have numbness in your legs or groin area. This information is not intended to replace advice given to you by your health care provider. Make sure you discuss any questions you have with your health care provider. Document Released: 03/08/2001 Document Revised: 08/24/2015 Document Reviewed: 08/21/2013 Elsevier Interactive Patient Education  [employer].   Please use walker or cane or other assistive device for balance while up  Follow up with Dr Maureen Ralphs in the office in two to three  weeks, call [phone] for appt

## 2017-05-20 NOTE — Clinical Social Work Note (Signed)
Clinical Social Work Assessment  Patient Details  Name: Kelsey Salinas MRN: [id number] Date of Birth: 07-18-1926  Date of referral:  05/20/17               Reason for consult:  Facility Placement                Permission sought to share information with:  Facility Art therapist granted to share information::  Yes, Verbal Permission Granted  Name::        Agency::  SNF [facility]  Relationship::     Contact Information:     Housing/Transportation Living arrangements for the past 2 months:  [place] of Information:  Patient Patient Interpreter Needed:  None Criminal Activity/Legal Involvement Pertinent to Current Situation/Hospitalization:  No - Comment as needed Significant Relationships:  Adult Children Lives with:  Self Do you feel safe going back to the place where you live?  No Need for family participation in patient care:  No (Coment)  Care giving concerns:  Pt resides in [place] independent living and had a fall. Clinical team recommending SNF at discharge.  Social Worker assessment / plan:  CSW met with bedside to discuss the disposition plan. CSW explained the SNF process and placement. CSW obtained permission to send to [facility] for skilled nursing. Pt was independent prior to fall. Pt agreeable to plan. CSW will f/u with staff at [facility] on the skilled bed. CSW will make referral and f/u for disposition.  Employment status:  Retired Surveyor, [employer] Care PT Recommendations:  [place] / Referral to community resources:  [place]  Patient/Family's Response to care:  Patient thanked CSW for meeting to discuss. Pt agreeable to [facility] before returning to independent living.  Patient/Family's Understanding of and Emotional Response to Diagnosis, Current Treatment, and Prognosis:  Patient has good understanding of her impairment and appears to  be in good emotional state. Pt desires to receive SNF at [place], which is where she resides in independent living. Pt agreeable to plan and hopes to return home soon. No issues or concerns identified.  Emotional Assessment Appearance:  Appears stated age Attitude/Demeanor/Rapport:  (Cooperative) Affect (typically observed):  Accepting, Appropriate Orientation:  Oriented to Situation, Oriented to  Time, Oriented to Self Alcohol / Substance use:  Not Applicable Psych involvement (Current and /or in the community):  No (Comment)  Discharge Needs  Concerns to be addressed:  Discharge Planning Concerns Readmission within the last 30 days:  No Current discharge risk:  Dependent with Mobility, Physical Impairment Barriers to Discharge:  No Barriers Identified   Normajean Baxter, LCSW 05/20/2017, 9:59 AM

## 2017-05-23 ENCOUNTER — Telehealth: Payer: Self-pay

## 2017-05-23 DIAGNOSIS — M84454S Pathological fracture, pelvis, sequela: Secondary | ICD-10-CM | POA: Diagnosis not present

## 2017-05-23 DIAGNOSIS — Z4789 Encounter for other orthopedic aftercare: Secondary | ICD-10-CM | POA: Diagnosis not present

## 2017-05-23 DIAGNOSIS — M5489 Other dorsalgia: Secondary | ICD-10-CM | POA: Diagnosis not present

## 2017-05-23 DIAGNOSIS — S3289XS Fracture of other parts of pelvis, sequela: Secondary | ICD-10-CM | POA: Diagnosis not present

## 2017-05-23 DIAGNOSIS — Z9181 History of falling: Secondary | ICD-10-CM | POA: Diagnosis not present

## 2017-05-23 DIAGNOSIS — M62562 Muscle wasting and atrophy, not elsewhere classified, left lower leg: Secondary | ICD-10-CM | POA: Diagnosis not present

## 2017-05-23 DIAGNOSIS — M25551 Pain in right hip: Secondary | ICD-10-CM | POA: Diagnosis not present

## 2017-05-23 DIAGNOSIS — R2689 Other abnormalities of gait and mobility: Secondary | ICD-10-CM | POA: Diagnosis not present

## 2017-05-23 DIAGNOSIS — S32511S Fracture of superior rim of right pubis, sequela: Secondary | ICD-10-CM | POA: Diagnosis not present

## 2017-05-23 DIAGNOSIS — R278 Other lack of coordination: Secondary | ICD-10-CM | POA: Diagnosis not present

## 2017-05-23 DIAGNOSIS — M5387 Other specified dorsopathies, lumbosacral region: Secondary | ICD-10-CM | POA: Diagnosis not present

## 2017-05-23 DIAGNOSIS — R102 Pelvic and perineal pain: Secondary | ICD-10-CM | POA: Diagnosis not present

## 2017-05-23 DIAGNOSIS — M62561 Muscle wasting and atrophy, not elsewhere classified, right lower leg: Secondary | ICD-10-CM | POA: Diagnosis not present

## 2017-05-23 DIAGNOSIS — W1831XS Fall on same level due to stepping on an object, sequela: Secondary | ICD-10-CM | POA: Diagnosis not present

## 2017-05-23 DIAGNOSIS — M810 Age-related osteoporosis without current pathological fracture: Secondary | ICD-10-CM | POA: Diagnosis not present

## 2017-05-23 NOTE — Telephone Encounter (Signed)
Possible re-admission to facility. This is a patient you were seeing at [facility] that is now at [facility] . [facility] F/U is needed if patient was re-admitted to facility upon discharge. Hospital discharge from [facility] on 05/20/2017

## 2017-05-23 NOTE — Telephone Encounter (Signed)
Will see tomorrow in rehab.

## 2017-05-24 ENCOUNTER — Non-Acute Institutional Stay (SKILLED_NURSING_FACILITY): Payer: Medicare Other | Admitting: Internal Medicine

## 2017-05-24 ENCOUNTER — Encounter: Payer: Self-pay | Admitting: Internal Medicine

## 2017-05-24 DIAGNOSIS — M5489 Other dorsalgia: Secondary | ICD-10-CM | POA: Diagnosis not present

## 2017-05-24 DIAGNOSIS — M62562 Muscle wasting and atrophy, not elsewhere classified, left lower leg: Secondary | ICD-10-CM | POA: Diagnosis not present

## 2017-05-24 DIAGNOSIS — M5387 Other specified dorsopathies, lumbosacral region: Secondary | ICD-10-CM | POA: Diagnosis not present

## 2017-05-24 DIAGNOSIS — N183 Chronic kidney disease, stage 3 unspecified: Secondary | ICD-10-CM

## 2017-05-24 DIAGNOSIS — M1612 Unilateral primary osteoarthritis, left hip: Secondary | ICD-10-CM | POA: Diagnosis not present

## 2017-05-24 DIAGNOSIS — I10 Essential (primary) hypertension: Secondary | ICD-10-CM

## 2017-05-24 DIAGNOSIS — R000 Tachycardia, unspecified: Secondary | ICD-10-CM | POA: Diagnosis not present

## 2017-05-24 DIAGNOSIS — R053 Chronic cough: Secondary | ICD-10-CM

## 2017-05-24 DIAGNOSIS — M62561 Muscle wasting and atrophy, not elsewhere classified, right lower leg: Secondary | ICD-10-CM | POA: Diagnosis not present

## 2017-05-24 DIAGNOSIS — R2689 Other abnormalities of gait and mobility: Secondary | ICD-10-CM | POA: Diagnosis not present

## 2017-05-24 DIAGNOSIS — R05 Cough: Secondary | ICD-10-CM

## 2017-05-24 DIAGNOSIS — S32810A Multiple fractures of pelvis with stable disruption of pelvic ring, initial encounter for closed fracture: Secondary | ICD-10-CM | POA: Diagnosis not present

## 2017-05-24 DIAGNOSIS — K5904 Chronic idiopathic constipation: Secondary | ICD-10-CM | POA: Diagnosis not present

## 2017-05-24 DIAGNOSIS — E7800 Pure hypercholesterolemia, unspecified: Secondary | ICD-10-CM | POA: Diagnosis not present

## 2017-05-24 DIAGNOSIS — M25551 Pain in right hip: Secondary | ICD-10-CM | POA: Diagnosis not present

## 2017-05-24 DIAGNOSIS — M810 Age-related osteoporosis without current pathological fracture: Secondary | ICD-10-CM

## 2017-05-24 DIAGNOSIS — E1122 Type 2 diabetes mellitus with diabetic chronic kidney disease: Secondary | ICD-10-CM | POA: Diagnosis not present

## 2017-05-24 NOTE — Progress Notes (Signed)
Patient ID: Kelsey Salinas, female   DOB: 1926-11-24, 84 y.o.   MRN: [id number]  Provider:  Rexene Edison. Mariea Clonts, D.O., C.M.D. Location:  [place] Room Number: [place] of Service:  SNF (31)  PCP: Gayland Curry, DO Patient Care Team: Gayland Curry, DO as PCP - General (Geriatric Medicine) Belva Crome, MD as PCP - Cardiology (Cardiology) Gaynelle Arabian, MD as Consulting Physician (Orthopedic Surgery) Belva Crome, MD as Consulting Physician (Cardiology) Monna Fam, MD as Consulting Physician (Ophthalmology) Elsie Saas, MD as Consulting Physician (Orthopedic Surgery)  Extended Emergency Contact Information Primary Emergency Contact: [place] Phone: [phone] Relation: Daughter Secondary Emergency Contact: Dhanya, [place] Mobile Phone: [phone] Relation: Niece   Code Status: FULL CODE Goals of Care: Advanced Directive information Advanced Directives 05/24/2017  Does Patient Have a Medical Advance Directive? Yes  Type of Paramedic of [place];Living will  Does patient want to make changes to medical advance directive? No - Patient declined  Copy of [place] in Chart? Yes  Would patient like information on creating a medical advance directive? -  Pre-existing out of facility DNR order (yellow form or pink MOST form) -    Chief Complaint  Patient presents with  . New Admit To SNF    Rehab admission    HPI: -Patient is a 84 y.o. female with h/o chronic sinus tachy (with plans for holter monitoring by Dr. Tamala Julian), diverticulosis, htn, hypothyroidism, recently DMII, and chronic diastolic chf seen today for admission to [facility] re[facility] s/p hospitalization from 5/8-5/10/19 s/p fall in her apt on 5/8.  She tripped over her wheel of her walker.  She was unable to get up on her own and security, clinic nurse were called.   She was having  severe pain in her right hip and leg and could not bear weight upon her assessment.  I instructed them to call EMS.  Pt was transferred to the hospital and found to have pelvic fractures:  Acute displaced fx of the right superior pubic ramus extending to the symphysis seen on u/l right hip and pelvis xrays and CT of the pelvis then revealed an ill-defined contusion/hematoma in the soft tissues over the right hip that indicated some active hemorrhage.  She had acute comminuted fxs of the right superior and inferior pubic rami, minimally displaced acute fxs of the margins of the left pubic symphysis, nondisplaced acute fx of the right iliac crestor.  Moderate to severe left hip OA, aortic atherosclerosis and myomatus uterus all noted, as well.  She was seen by Dr. Veverly Fells who recommended nonoperative mgt with WBAT bilaterally.  PT recommended SNF.  Due to her hematoma, she was not sent on DVT prophylaxis.  She was also WBAT and expected to be moving upon arrival to SNF.    She's has not been getting up as much as expected.  5/12, the stand-up lift was used.  She's been argumentative with staff and abusive at times, I'm told.  She has her usual caregiver with her for a few hours per day.  She has come with oxycodone and robaxin for pain.  When I saw her, she'd received just one oxycodone (pt had been in rehab for 4 total days).  She admitted to pain with mild movement even in the recliner.  Therapy had been initiated, but pt has been resistant to much mobility.  Pt has never been a pill-taker.  Reviewing labs from  hospitalization, admission hgb was 13.6 and dropped to 12.3, then 11.2 on day of discharge which was reported as stable.  She denies dizziness.  WBC had also been elevated but improved to 10 from 13.9.    She has some chronic constipation, but has been moving her bowels here with medication assistance.    She is on oral agents only for her diabetes and the metformin was started within the past year.      As mentioned above, her tachycardia has been persistent and Dr. Tamala Julian had plans to arrange a holter monitor for her, but this has not happened yet.   In terms of osteoporosis, she has been on three different bisphosphonates when I reviewed her bone density and she still shattered her pelvis so we discussed that a change will be necessary after she completes her rehab.  She was agreeable to this.    Past Medical History:  Diagnosis Date  . Childhood asthma   . Chronic diastolic heart failure [facility]) 12/25/2012   October 2013 echocardiogram 1. There is moderate concentric left ventricle hypertrophy. 2. Left ventricular ejection fraction estimated by 2D at 60-65 percent. 3. There were no regional wall motion abnormalities. 4. Mild mitral annular calcification. 5. Mild mitral valve regurgitation. 6. There is mild tricuspid regurgitation. 7. Mildly elevated estimated right ventricular systolic pressure. 8. Right ventricular systolic pressure estimated at 35-40 mm Hg. 9. Mild calcification of the aortic valve. 10. Trace aortic valve regurgitation. 11. Analysis of mitral valve inflow, pulmonary vein Doppler and tissue Doppler suggests grade I diastolic dysfunction without elevated left atrial pressure.   . Colon polyp   . Diverticular stricture ([place]) 2009  . Diverticulosis 10-30-2007  . Edema 09/12/13  . Fall 05/18/2017   tripped over wheel of walker; superior and inferior pubic ramus fractures   . Hypercholesterolemia   . Hypertension   . Hypertension, essential   . Hypothyroidism    as a teenager  . Indigestion   . Insomnia   . OA (osteoarthritis) of hip    right hip replacement, OA in bilateral knees  . Osteopenia    Actonel stopped in 2012 after 5years therapy  . Pneumonia 1939   "hospitalized"  . Rapid resting heart rate 36/62/9476   Uncertain cause. Possible ectopic atrial tachycardia. Date entered 12/25/12   . Right rotator cuff tear   . Seasonal allergies    Past Surgical History:   Procedure Laterality Date  . CATARACT EXTRACTION W/ INTRAOCULAR LENS  IMPLANT, BILATERAL Bilateral   . COLONOSCOPY  2009   Dr. Redmond School  . DILATION AND CURETTAGE OF UTERUS    . FRACTURE SURGERY    . JOINT REPLACEMENT    . ORIF WRIST FRACTURE Right   . REDUCTION MAMMAPLASTY  1999  . TOTAL HIP ARTHROPLASTY Right 03-04-08   Dr. Wynelle Link    reports that she has quit smoking. Her smoking use included cigarettes. She has a 6.00 pack-year smoking history. She has never used smokeless tobacco. She reports that she drinks about 1.2 oz of alcohol per week. She reports that she does not use drugs. Social History   Socioeconomic History  . Marital status: Married    Spouse name: Not on file  . Number of children: Not on file  . Years of education: Not on file  . Highest education level: Not on file  Occupational History  . Occupation: retired travel Hydrologist Needs  . Financial resource strain: Not on file  . Food insecurity:  Worry: Not on file    Inability: Not on file  . Transportation needs:    Medical: Not on file    Non-medical: Not on file  Tobacco Use  . Smoking status: Former Smoker    Packs/day: 0.12    Years: 50.00    Pack years: 6.00    Types: Cigarettes  . Smokeless tobacco: Never Used  . Tobacco comment: "stopped in the ate 1990s"  Substance and Sexual Activity  . Alcohol use: Yes    Alcohol/week: 1.2 oz    Types: 2 Glasses of wine per week  . Drug use: Never  . Sexual activity: Not Currently  Lifestyle  . Physical activity:    Days per week: Not on file    Minutes per session: Not on file  . Stress: Not on file  Relationships  . Social connections:    Talks on phone: Not on file    Gets together: Not on file    Attends religious service: Not on file    Active member of club or organization: Not on file    Attends meetings of clubs or organizations: Not on file    Relationship status: Not on file  . Intimate partner violence:    Fear of current or ex  partner: Not on file    Emotionally abused: Not on file    Physically abused: Not on file    Forced sexual activity: Not on file  Other Topics Concern  . Not on file  Social History Narrative   Lives at [place] since 2010   Husband Ilona Sorrel married 1952 (husband in memory care unit at [employer])   Former smoker 1/2 PPD, for 30 years, stopped 01/10/1989   Alcohol 2 glasses of wine at night   Exercise: machines 3 times a week and daily walk   No POA/LW          Functional Status Survey:    Family History  Problem Relation Age of Onset  . Heart disease Mother   . Heart disease Father   . Heart disease Brother     Health Maintenance  Topic Date Due  . FOOT EXAM  09/02/1938  . OPHTHALMOLOGY EXAM  09/02/1938  . TETANUS/TDAP  09/02/1947  . PNA vac Low Risk Adult (2 of 2 - PPSV23) 06/04/2015  . INFLUENZA VACCINE  08/11/2017  . HEMOGLOBIN A1C  11/19/2017  . DEXA SCAN  Completed    Allergies  Allergen Reactions  . Amlodipine Besylate Other (See Comments)    Edema  . Lisinopril Cough    Cough   . Sulfamethoxazole-Trimethoprim Other (See Comments)    Unknown reaction     Outpatient Encounter Medications as of 05/24/2017  Medication Sig  . alendronate (FOSAMAX) 70 MG tablet Take 70 mg by mouth once a week. Take with a full glass of water on an empty stomach.  Marland Kitchen aspirin EC 81 MG tablet Take 81 mg by mouth daily.  Marland Kitchen atorvastatin (LIPITOR) 10 MG tablet Take 10 mg by mouth daily. For cholesterol  . bisacodyl (DULCOLAX) 10 MG suppository Place 10 mg rectally as needed for moderate constipation.  . Cholecalciferol (VITAMIN D PO) Take 1 tablet by mouth every 7 (seven) days.  . diclofenac sodium (VOLTAREN) 1 % GEL Apply 2 g topically every 6 (six) hours as needed.  . famotidine (PEPCID) 20 MG tablet Take 20 mg by mouth daily.  . hydrocortisone 2.5 % cream Apply 1 application topically as needed.   Marland Kitchen losartan-hydrochlorothiazide (HYZAAR) 100-25 MG  tablet Take 1 tablet by mouth  daily.  . metFORMIN (GLUCOPHAGE) 500 MG tablet TAKE (1/2) TABLET DAILY.  . methocarbamol (ROBAXIN) 500 MG tablet Take 1 tablet (500 mg total) by mouth every 8 (eight) hours as needed for muscle spasms.  . metoprolol succinate (TOPROL-XL) 100 MG 24 hr tablet Take one and a half (1 and 1/2) tablets (150 mg) by mouth twice daily.  Marland Kitchen oxyCODONE (OXY IR/ROXICODONE) 5 MG immediate release tablet Take 1 tablet (5 mg total) by mouth every 4 (four) hours as needed for moderate pain.  . polyethylene glycol (MIRALAX / GLYCOLAX) packet Take 17 g by mouth daily as needed for mild constipation.  . [DISCONTINUED] OVER THE COUNTER MEDICATION Take 1 tablet by mouth daily. Med Name: ACID REDUCER   No facility-administered encounter medications on file as of 05/24/2017.     Review of Systems  Constitutional: Positive for activity change. Negative for appetite change, chills and fever.  HENT: Positive for hearing loss. Negative for congestion.   Eyes: Negative for visual disturbance.       Glasses  Respiratory: Negative for apnea, chest tightness, shortness of breath and wheezing.   Cardiovascular: Positive for leg swelling. Negative for chest pain.       Edema of right leg improving  Gastrointestinal: Positive for constipation. Negative for abdominal distention, abdominal pain, blood in stool, nausea and vomiting.  Genitourinary: Positive for urgency. Negative for dysuria and frequency.  Musculoskeletal: Positive for arthralgias, gait problem and neck pain. Negative for back pain and myalgias.       Neck not as bothersome now  Skin: Negative for color change.       Bruising of right hip  Allergic/Immunologic:       Diabetes  Neurological: Negative for dizziness and weakness.  Hematological: Bruises/bleeds easily.  Psychiatric/Behavioral: Positive for agitation. Negative for confusion and sleep disturbance.       Did not admit to anxiety with me    Vitals:   05/24/17 1013  BP: 119/68  Pulse: (!) 107    Resp: 17  Temp: 98.6 F (37 C)  TempSrc: Oral  SpO2: 91%  Weight: 181 lb (82.1 kg)  Height: 5\' 7"  (1.702 m)   Body mass index is 28.35 kg/m. Physical Exam  Constitutional: She is oriented to person, place, and time. She appears well-developed and well-nourished. No distress.  HENT:  Head: Normocephalic and atraumatic.  Right Ear: External ear normal.  Left Ear: External ear normal.  Nose: Nose normal.  Mouth/Throat: Oropharynx is clear and moist.  Mild HOH  Eyes: Pupils are equal, round, and reactive to light. EOM are normal.  glasses  Neck: Neck supple. No JVD present.  Cardiovascular: Intact distal pulses.  Tachy, regular; mild bilateral nonpitting edema at present  Pulmonary/Chest: Effort normal and breath sounds normal. She has no rales.  Abdominal: Soft. Bowel sounds are normal. She exhibits no distension and no mass. There is no tenderness. There is no rebound and no guarding.  Musculoskeletal: Normal range of motion. She exhibits tenderness.  Right groin  Lymphadenopathy:    She has no cervical adenopathy.  Neurological: She is alert and oriented to person, place, and time. No cranial nerve deficit.  Skin: Skin is warm and dry. Capillary refill takes less than 2 seconds.  Ecchymoses of right posterior hip/buttock region dark purple tone  Psychiatric: She has a normal mood and affect.    Labs reviewed: Basic Metabolic Panel: Recent Labs    05/18/17 1900 05/18/17 1918 05/19/17 0434  NA 140 141 140  K 3.3* 3.3* 4.2  CL 103 103 106  CO2 26  --  27  GLUCOSE 108* 103* 132*  BUN 20 23* 22*  CREATININE 1.05* 1.00 1.07*  CALCIUM 8.9  --  8.8*  MG 1.8  --   --    Liver Function Tests: Recent Labs    05/19/17 0434  ALBUMIN 3.4*   No results for input(s): LIPASE, AMYLASE in the last 8760 hours. No results for input(s): AMMONIA in the last 8760 hours. CBC: Recent Labs    05/18/17 1900 05/18/17 1918 05/19/17 0434 05/20/17 0450  WBC 13.9*  --  9.0 10.0   NEUTROABS 10.9*  --   --   --   HGB 13.5 13.6 12.3 11.2*  HCT 42.6 40.0 39.4 35.9*  MCV 95.7  --  95.9 96.2  PLT 169  --  167 130*   Cardiac Enzymes: No results for input(s): CKTOTAL, CKMB, CKMBINDEX, TROPONINI in the last 8760 hours. BNP: Invalid input(s): POCBNP Lab Results  Component Value Date   HGBA1C 6.6 (H) 05/19/2017   Lab Results  Component Value Date   TSH 0.61 03/25/2015   No results found for: VITAMINB12 No results found for: FOLATE No results found for: IRON, TIBC, FERRITIN  Imaging and Procedures obtained prior to SNF admission: Ct Pelvis W Contrast  Result Date: 05/18/2017 CLINICAL DATA:  84 y/o F; fall on right side. Right hip replacement 2010. EXAM: CT PELVIS WITH CONTRAST TECHNIQUE: Multidetector CT imaging of the pelvis was performed using the standard protocol following the bolus administration of intravenous contrast. CONTRAST:  167mL OMNIPAQUE IOHEXOL 300 MG/ML  SOLN COMPARISON:  03/04/2008 pelvis radiographs. FINDINGS: Urinary Tract:  No abnormality visualized. Bowel:  Unremarkable visualized pelvic bowel loops. Vascular/Lymphatic: Calcific atherosclerosis of the aorta. Reproductive:  Calcified uterine myomas measuring up to 21 mm. Other: Ill-defined contusion/hematoma in the soft tissues overlying the right hip with a blush contrast extravasation indicating active hemorrhage. Musculoskeletal: Acute comminuted and minimally displaced fractures of the right superior and inferior pubic rami. Chronic fracture deformity of the right posterior inferior pubic rami. Minimally displaced acute fractures at the margins of left pubic symphysis. Nondisplaced fracture of the right iliac crest. 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 IMPRESSION: 1. Ill-defined contusion/hematoma in the soft tissues overlying the right hip. Blush of contrast indicates active hemorrhage. 2. Acute comminuted and mildly displaced  fractures of the right superior and inferior pubic rami. 3. Minimally displaced acute fractures of the margins of left pubic symphysis. 4. Nondisplaced acute fracture of the right iliac crest. 5. 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 6. Aortic atherosclerosis. 7. Myomatous uterus. These results were called by telephone at the time of interpretation on 05/18/2017 at 8:54 pm to Dr. Deno Etienne , who verbally acknowledged these results. Electronically Signed   By: Kristine Garbe M.D.   On: 05/18/2017 20:56   Dg Hip Unilat W Or Wo Pelvis 2-3 Views Right  Result Date: 05/18/2017 CLINICAL DATA:  Fall with right hip pain. Right hip replacement 7 years ago. EXAM: DG HIP (WITH OR WITHOUT PELVIS) 2-3V RIGHT COMPARISON:  02/28/2008 FINDINGS: Right hip arthroplasty intact. Moderate osteoarthritic change of the left hip. Old right inferior pubic ramus fracture. Acute displaced fracture involving the right superior pubic ramus extending to the symphysis. Degenerate change of the spine. IMPRESSION: Acute displaced fracture of the right superior pubic ramus extending to the symphysis. Electronically Signed  By: Marin Olp M.D.   On: 05/18/2017 18:49    Assessment/Plan 1. Multiple closed fractures of pelvis with stable disruption of pelvic ring, initial encounter ([place]) -encourage more activity with PT and getting up more overall to prevent DVT and get stronger faster -cont oxycodone--will need to receive pre-PT as we do with most residents and prn -f/u with Dr. Veverly Fells as planned  2. Type 2 diabetes mellitus with stage 3 chronic kidney disease, without long-term current use of insulin (HCC) -cont metformin, has been well controlled  3. Rapid resting heart rate -for holter monitor with Dr. Tamala Julian  4. Essential hypertension -cont current bp regimen, monitor, may be affected by her pain currently so would not make major changes  5. Chronic idiopathic constipation -cont current bowel regimen,  encourage hydration and increased mobility  6. Persistent dry cough -ongoing, I changed her from an ace to losartan at one point, she felt like her symptoms improved with resolution of some environmental allergies, but she's got the cough again now, does not stick with medication txs for this for long enough to tell if benefiting  7. Hypercholesterolemia -cont statin therapy with lipitor, last LDL was near goal at 79 7 mos ago, would be followed up  8. Senile osteoporosis -cont fosamax, weekly vitamin D--dose not in system -will plan to consider a change to prolia or evenity for her   9. Primary osteoarthritis of left hip -has been chronic, primary reason she uses a walker long-term for stability  Family/ staff Communication:   Discussed with rehab nursing  Labs/tests ordered:  Will need f/u cbc, bmp, may check flp while here also  Leasia Swann L. Chyler Creely, D.O. [facility] [address], [place] [address] Cell Phone (Mon-Fri 8am-5pm):  [phone] On Call:  [phone] & follow prompts after 5pm & weekends Office Phone:  [phone] Office Fax:  [phone]

## 2017-05-25 DIAGNOSIS — M5387 Other specified dorsopathies, lumbosacral region: Secondary | ICD-10-CM | POA: Diagnosis not present

## 2017-05-25 DIAGNOSIS — M25551 Pain in right hip: Secondary | ICD-10-CM | POA: Diagnosis not present

## 2017-05-25 DIAGNOSIS — M62561 Muscle wasting and atrophy, not elsewhere classified, right lower leg: Secondary | ICD-10-CM | POA: Diagnosis not present

## 2017-05-25 DIAGNOSIS — M62562 Muscle wasting and atrophy, not elsewhere classified, left lower leg: Secondary | ICD-10-CM | POA: Diagnosis not present

## 2017-05-25 DIAGNOSIS — M5489 Other dorsalgia: Secondary | ICD-10-CM | POA: Diagnosis not present

## 2017-05-25 DIAGNOSIS — R2689 Other abnormalities of gait and mobility: Secondary | ICD-10-CM | POA: Diagnosis not present

## 2017-05-26 DIAGNOSIS — M5387 Other specified dorsopathies, lumbosacral region: Secondary | ICD-10-CM | POA: Diagnosis not present

## 2017-05-26 DIAGNOSIS — M62562 Muscle wasting and atrophy, not elsewhere classified, left lower leg: Secondary | ICD-10-CM | POA: Diagnosis not present

## 2017-05-26 DIAGNOSIS — M5489 Other dorsalgia: Secondary | ICD-10-CM | POA: Diagnosis not present

## 2017-05-26 DIAGNOSIS — M62561 Muscle wasting and atrophy, not elsewhere classified, right lower leg: Secondary | ICD-10-CM | POA: Diagnosis not present

## 2017-05-26 DIAGNOSIS — R2689 Other abnormalities of gait and mobility: Secondary | ICD-10-CM | POA: Diagnosis not present

## 2017-05-26 DIAGNOSIS — M25551 Pain in right hip: Secondary | ICD-10-CM | POA: Diagnosis not present

## 2017-05-27 ENCOUNTER — Other Ambulatory Visit: Payer: Self-pay | Admitting: Adult Health

## 2017-05-27 DIAGNOSIS — M25551 Pain in right hip: Secondary | ICD-10-CM | POA: Diagnosis not present

## 2017-05-27 DIAGNOSIS — M5489 Other dorsalgia: Secondary | ICD-10-CM | POA: Diagnosis not present

## 2017-05-27 DIAGNOSIS — Z7901 Long term (current) use of anticoagulants: Secondary | ICD-10-CM | POA: Diagnosis not present

## 2017-05-27 DIAGNOSIS — D649 Anemia, unspecified: Secondary | ICD-10-CM | POA: Diagnosis not present

## 2017-05-27 DIAGNOSIS — M62562 Muscle wasting and atrophy, not elsewhere classified, left lower leg: Secondary | ICD-10-CM | POA: Diagnosis not present

## 2017-05-27 DIAGNOSIS — M5387 Other specified dorsopathies, lumbosacral region: Secondary | ICD-10-CM | POA: Diagnosis not present

## 2017-05-27 DIAGNOSIS — R2689 Other abnormalities of gait and mobility: Secondary | ICD-10-CM | POA: Diagnosis not present

## 2017-05-27 DIAGNOSIS — M62561 Muscle wasting and atrophy, not elsewhere classified, right lower leg: Secondary | ICD-10-CM | POA: Diagnosis not present

## 2017-05-27 MED ORDER — LORAZEPAM 0.5 MG PO TABS: 0.2500 mg | ORAL_TABLET | Freq: Three times a day (TID) | ORAL | 1 refills | Status: DC | PRN

## 2017-05-30 ENCOUNTER — Telehealth: Payer: Self-pay | Admitting: Internal Medicine

## 2017-05-30 DIAGNOSIS — R2689 Other abnormalities of gait and mobility: Secondary | ICD-10-CM | POA: Diagnosis not present

## 2017-05-30 DIAGNOSIS — Z79899 Other long term (current) drug therapy: Secondary | ICD-10-CM | POA: Diagnosis not present

## 2017-05-30 DIAGNOSIS — M5489 Other dorsalgia: Secondary | ICD-10-CM | POA: Diagnosis not present

## 2017-05-30 DIAGNOSIS — M5387 Other specified dorsopathies, lumbosacral region: Secondary | ICD-10-CM | POA: Diagnosis not present

## 2017-05-30 DIAGNOSIS — D649 Anemia, unspecified: Secondary | ICD-10-CM | POA: Diagnosis not present

## 2017-05-30 DIAGNOSIS — M62561 Muscle wasting and atrophy, not elsewhere classified, right lower leg: Secondary | ICD-10-CM | POA: Diagnosis not present

## 2017-05-30 DIAGNOSIS — M62562 Muscle wasting and atrophy, not elsewhere classified, left lower leg: Secondary | ICD-10-CM | POA: Diagnosis not present

## 2017-05-30 DIAGNOSIS — M25551 Pain in right hip: Secondary | ICD-10-CM | POA: Diagnosis not present

## 2017-05-30 LAB — CBC AND DIFFERENTIAL
HCT: 35 — AB (ref 36–46)
Hemoglobin: 11.6 — AB (ref 12.0–16.0)
Platelets: 283 (ref 150–399)
WBC: 9

## 2017-05-30 NOTE — Telephone Encounter (Signed)
I left a message asking the pt to call me at [phone] to schedule AWV with Clarise Cruz at [facility] on 06/28/17 if available. VDM (DD)

## 2017-05-31 ENCOUNTER — Encounter: Payer: Self-pay | Admitting: Internal Medicine

## 2017-05-31 DIAGNOSIS — M5489 Other dorsalgia: Secondary | ICD-10-CM | POA: Diagnosis not present

## 2017-05-31 DIAGNOSIS — M62562 Muscle wasting and atrophy, not elsewhere classified, left lower leg: Secondary | ICD-10-CM | POA: Diagnosis not present

## 2017-05-31 DIAGNOSIS — M5387 Other specified dorsopathies, lumbosacral region: Secondary | ICD-10-CM | POA: Diagnosis not present

## 2017-05-31 DIAGNOSIS — M25551 Pain in right hip: Secondary | ICD-10-CM | POA: Diagnosis not present

## 2017-05-31 DIAGNOSIS — E1122 Type 2 diabetes mellitus with diabetic chronic kidney disease: Secondary | ICD-10-CM | POA: Insufficient documentation

## 2017-05-31 DIAGNOSIS — R2689 Other abnormalities of gait and mobility: Secondary | ICD-10-CM | POA: Diagnosis not present

## 2017-05-31 DIAGNOSIS — N183 Chronic kidney disease, stage 3 unspecified: Secondary | ICD-10-CM | POA: Insufficient documentation

## 2017-05-31 DIAGNOSIS — M62561 Muscle wasting and atrophy, not elsewhere classified, right lower leg: Secondary | ICD-10-CM | POA: Diagnosis not present

## 2017-06-01 DIAGNOSIS — M25551 Pain in right hip: Secondary | ICD-10-CM | POA: Diagnosis not present

## 2017-06-01 DIAGNOSIS — M5387 Other specified dorsopathies, lumbosacral region: Secondary | ICD-10-CM | POA: Diagnosis not present

## 2017-06-01 DIAGNOSIS — M62561 Muscle wasting and atrophy, not elsewhere classified, right lower leg: Secondary | ICD-10-CM | POA: Diagnosis not present

## 2017-06-01 DIAGNOSIS — R2689 Other abnormalities of gait and mobility: Secondary | ICD-10-CM | POA: Diagnosis not present

## 2017-06-01 DIAGNOSIS — M62562 Muscle wasting and atrophy, not elsewhere classified, left lower leg: Secondary | ICD-10-CM | POA: Diagnosis not present

## 2017-06-01 DIAGNOSIS — M5489 Other dorsalgia: Secondary | ICD-10-CM | POA: Diagnosis not present

## 2017-06-02 ENCOUNTER — Encounter: Payer: Self-pay | Admitting: Adult Health

## 2017-06-02 ENCOUNTER — Non-Acute Institutional Stay (SKILLED_NURSING_FACILITY): Payer: Medicare Other | Admitting: Adult Health

## 2017-06-02 DIAGNOSIS — F419 Anxiety disorder, unspecified: Secondary | ICD-10-CM | POA: Diagnosis not present

## 2017-06-02 DIAGNOSIS — K5901 Slow transit constipation: Secondary | ICD-10-CM | POA: Diagnosis not present

## 2017-06-02 DIAGNOSIS — S32591D Other specified fracture of right pubis, subsequent encounter for fracture with routine healing: Secondary | ICD-10-CM

## 2017-06-02 DIAGNOSIS — I10 Essential (primary) hypertension: Secondary | ICD-10-CM | POA: Diagnosis not present

## 2017-06-02 DIAGNOSIS — E7800 Pure hypercholesterolemia, unspecified: Secondary | ICD-10-CM

## 2017-06-02 DIAGNOSIS — R2689 Other abnormalities of gait and mobility: Secondary | ICD-10-CM | POA: Diagnosis not present

## 2017-06-02 DIAGNOSIS — M62562 Muscle wasting and atrophy, not elsewhere classified, left lower leg: Secondary | ICD-10-CM | POA: Diagnosis not present

## 2017-06-02 DIAGNOSIS — M5489 Other dorsalgia: Secondary | ICD-10-CM | POA: Diagnosis not present

## 2017-06-02 DIAGNOSIS — E785 Hyperlipidemia, unspecified: Secondary | ICD-10-CM | POA: Diagnosis not present

## 2017-06-02 DIAGNOSIS — M62561 Muscle wasting and atrophy, not elsewhere classified, right lower leg: Secondary | ICD-10-CM | POA: Diagnosis not present

## 2017-06-02 DIAGNOSIS — M25551 Pain in right hip: Secondary | ICD-10-CM | POA: Diagnosis not present

## 2017-06-02 DIAGNOSIS — D649 Anemia, unspecified: Secondary | ICD-10-CM | POA: Diagnosis not present

## 2017-06-02 DIAGNOSIS — M5387 Other specified dorsopathies, lumbosacral region: Secondary | ICD-10-CM | POA: Diagnosis not present

## 2017-06-02 LAB — BASIC METABOLIC PANEL
BUN: 30 — AB (ref 4–21)
Creatinine: 0.9 (ref 0.5–1.1)
Glucose: 102
Potassium: 4 (ref 3.4–5.3)
Sodium: 137 (ref 137–147)

## 2017-06-02 LAB — LIPID PANEL
Cholesterol: 156 (ref 0–200)
HDL: 52 (ref 35–70)
LDL Cholesterol: 79
Triglycerides: 127 (ref 40–160)

## 2017-06-02 NOTE — Progress Notes (Signed)
Location:  Occupational psychologist of Service:  SNF (31) Provider:   Cindi Carbon, ANP [place] [phone]   Gayland Curry, DO  Patient Care Team: Gayland Curry, DO as PCP - General (Geriatric Medicine) Belva Crome, MD as PCP - Cardiology (Cardiology) Gaynelle Arabian, MD as Consulting Physician (Orthopedic Surgery) Belva Crome, MD as Consulting Physician (Cardiology) Monna Fam, MD as Consulting Physician (Ophthalmology) Elsie Saas, MD as Consulting Physician (Orthopedic Surgery)  Extended Emergency Contact Information Primary Emergency Contact: Toole of [place] Phone: [phone] Relation: Daughter Secondary Emergency Contact: Gissella, [place] Mobile Phone: [phone] Relation: Niece  Code Status:  Full code Goals of care: Advanced Directive information Advanced Directives 05/24/2017  Does Patient Have a Medical Advance Directive? Yes  Type of Paramedic of [place];Living will  Does patient want to make changes to medical advance directive? No - Patient declined  Copy of [place] in Chart? Yes  Would patient like information on creating a medical advance directive? -  Pre-existing out of facility DNR order (yellow form or pink MOST form) -     Chief Complaint  Patient presents with  . Acute Visit    follow up fx and anticoaguation    HPI:  Pt is a 84 y.o. female seen today for an acute visit for follow up regarding pelvic fractures. CT of the pelvis on 05/18/17 showed acute displaced fracture right superior pubic ramus extending to the symphysis, as well as a right hip hematoma. ON 05/27/17 she was placed on Eliquis due to immobility and lack of progress. She remains on a hoyer lift due to anxiety and fear about getting up. She is on scheduled robaxin and oxycodone and feels her pain is controlled. The staff report that she does not perform  her leg exercises as instructed. She at times refuses to get out of bed. If she does get up she refuses at times to follow directions and resists encouragement from staff to improve mobility. I ordered prn ativan to help with anxiety but it has only been used one time in the past 5 days.  She had a BM this morning and denies constipation.     Past Medical History:  Diagnosis Date  . Childhood asthma   . Chronic diastolic heart failure [facility]) 12/25/2012   October 2013 echocardiogram 1. There is moderate concentric left ventricle hypertrophy. 2. Left ventricular ejection fraction estimated by 2D at 60-65 percent. 3. There were no regional wall motion abnormalities. 4. Mild mitral annular calcification. 5. Mild mitral valve regurgitation. 6. There is mild tricuspid regurgitation. 7. Mildly elevated estimated right ventricular systolic pressure. 8. Right ventricular systolic pressure estimated at 35-40 mm Hg. 9. Mild calcification of the aortic valve. 10. Trace aortic valve regurgitation. 11. Analysis of mitral valve inflow, pulmonary vein Doppler and tissue Doppler suggests grade I diastolic dysfunction without elevated left atrial pressure.   . Colon polyp   . Diverticular stricture ([place]) 2009  . Diverticulosis 10-30-2007  . Edema 09/12/13  . Fall 05/18/2017   tripped over wheel of walker; superior and inferior pubic ramus fractures   . Hypercholesterolemia   . Hypertension   . Hypertension, essential   . Hypothyroidism    as a teenager  . Indigestion   . Insomnia   . OA (osteoarthritis) of hip    right hip replacement, OA in bilateral knees  . Osteopenia    Actonel stopped in 2012  after 5years therapy  . Pneumonia 1939   "hospitalized"  . Rapid resting heart rate 16/10/9602   Uncertain cause. Possible ectopic atrial tachycardia. Date entered 12/25/12   . Right rotator cuff tear   . Seasonal allergies    Past Surgical History:  Procedure Laterality Date  . CATARACT EXTRACTION W/  INTRAOCULAR LENS  IMPLANT, BILATERAL Bilateral   . COLONOSCOPY  2009   Dr. [facility]  . DILATION AND CURETTAGE OF UTERUS    . FRACTURE SURGERY    . JOINT REPLACEMENT    . ORIF WRIST FRACTURE Right   . REDUCTION MAMMAPLASTY  1999  . TOTAL HIP ARTHROPLASTY Right 03-04-08   Dr. Wynelle Link    Allergies  Allergen Reactions  . Amlodipine Besylate Other (See Comments)    Edema  . Lisinopril Cough    Cough   . Sulfamethoxazole-Trimethoprim Other (See Comments)    Unknown reaction     Outpatient Encounter Medications as of 06/02/2017  Medication Sig  . apixaban (ELIQUIS) 2.5 MG TABS tablet Take 2.5 mg by mouth 2 (two) times daily.  Marland Kitchen alendronate (FOSAMAX) 70 MG tablet Take 70 mg by mouth once a week. Take with a full glass of water on an empty stomach.  Marland Kitchen aspirin EC 81 MG tablet Take 81 mg by mouth daily.  Marland Kitchen atorvastatin (LIPITOR) 10 MG tablet Take 10 mg by mouth daily. For cholesterol  . bisacodyl (DULCOLAX) 10 MG suppository Place 10 mg rectally as needed for moderate constipation.  . Cholecalciferol (VITAMIN D PO) Take 1 tablet by mouth every 7 (seven) days. 50,000 units  . diclofenac sodium (VOLTAREN) 1 % GEL Apply 2 g topically every 6 (six) hours as needed.  . famotidine (PEPCID) 20 MG tablet Take 20 mg by mouth daily.  . hydrocortisone 2.5 % cream Apply 1 application topically as needed.   Marland Kitchen LORazepam (ATIVAN) 0.5 MG tablet Take 0.5 tablets (0.25 mg total) by mouth every 8 (eight) hours as needed for anxiety.  Marland Kitchen losartan-hydrochlorothiazide (HYZAAR) 100-25 MG tablet Take 1 tablet by mouth daily.  . metFORMIN (GLUCOPHAGE) 500 MG tablet TAKE (1/2) TABLET DAILY.  . methocarbamol (ROBAXIN) 500 MG tablet Take 1 tablet (500 mg total) by mouth every 8 (eight) hours as needed for muscle spasms. (Patient taking differently: Take 500 mg by mouth 2 (two) times daily. BID and q 8 hrs prn)  . metoprolol succinate (TOPROL-XL) 100 MG 24 hr tablet Take one and a half (1 and 1/2) tablets (150 mg) by  mouth twice daily.  Marland Kitchen oxyCODONE (OXY IR/ROXICODONE) 5 MG immediate release tablet Take 1 tablet (5 mg total) by mouth every 4 (four) hours as needed for moderate pain. (Patient taking differently: Take 5 mg by mouth 2 (two) times daily. BID and q 4 hrs prn)  . polyethylene glycol (MIRALAX / GLYCOLAX) packet Take 17 g by mouth daily as needed for mild constipation.   No facility-administered encounter medications on file as of 06/02/2017.     Review of Systems  Constitutional: Positive for activity change. Negative for appetite change, chills, diaphoresis, fever and unexpected weight change.  HENT: Negative for congestion.   Respiratory: Positive for cough (chronic dry). Negative for shortness of breath and wheezing.   Gastrointestinal: Negative for abdominal distention, constipation and diarrhea.  Genitourinary: Negative for difficulty urinating and dysuria.  Musculoskeletal: Positive for arthralgias and gait problem. Negative for back pain.  Psychiatric/Behavioral: Positive for agitation. Negative for behavioral problems and confusion. The patient is nervous/anxious.     Immunization  History  Administered Date(s) Administered  . Influenza-Unspecified 10/25/2013, 10/31/2014, 11/06/2015, 11/01/2016  . Pneumococcal Conjugate-13 06/04/2014   Pertinent  Health Maintenance Due  Topic Date Due  . FOOT EXAM  09/02/1936  . OPHTHALMOLOGY EXAM  09/02/1936  . PNA vac Low Risk Adult (2 of 2 - PPSV23) 06/04/2015  . INFLUENZA VACCINE  08/11/2017  . HEMOGLOBIN A1C  11/19/2017  . DEXA SCAN  Completed   Fall Risk  12/01/2016 11/03/2016 06/16/2016 04/07/2016 10/01/2015  Falls in the past year? No No No No No   Functional Status Survey:    Vitals:   06/02/17 1000  BP: 133/81  Resp: 20  Temp: 98.1 F (36.7 C)  Weight: 177 lb 9.6 oz (80.6 kg)   Body mass index is 27.82 kg/m. Physical Exam  Constitutional: She is oriented to person, place, and time. No distress.  HENT:  Head: Normocephalic and  atraumatic.  Neck: No JVD present.  Cardiovascular: Normal rate and regular rhythm.  No murmur heard. Pulmonary/Chest: Effort normal and breath sounds normal. No respiratory distress. She has no wheezes.  Abdominal: Soft. Bowel sounds are normal.  Neurological: She is alert and oriented to person, place, and time.  Skin: Skin is warm and dry. She is not diaphoretic.  Old ecchymoses noted to the right hip to the right knee.   Psychiatric: She has a normal mood and affect.  Nursing note and vitals reviewed.   Labs reviewed: Recent Labs    05/18/17 1900 05/18/17 1918 05/19/17 0434  NA 140 141 140  K 3.3* 3.3* 4.2  CL 103 103 106  CO2 26  --  27  GLUCOSE 108* 103* 132*  BUN 20 23* 22*  CREATININE 1.05* 1.00 1.07*  CALCIUM 8.9  --  8.8*  MG 1.8  --   --    Recent Labs    05/19/17 0434  ALBUMIN 3.4*   Recent Labs    05/18/17 1900  05/19/17 0434 05/20/17 0450 05/30/17  WBC 13.9*  --  9.0 10.0 9.0  NEUTROABS 10.9*  --   --   --   --   HGB 13.5   < > 12.3 11.2* 11.6*  HCT 42.6   < > 39.4 35.9* 35*  MCV 95.7  --  95.9 96.2  --   PLT 169  --  167 130* 283   < > = values in this interval not displayed.   Lab Results  Component Value Date   TSH 0.61 03/25/2015   Lab Results  Component Value Date   HGBA1C 6.6 (H) 05/19/2017   Lab Results  Component Value Date   CHOL 167 10/26/2016   HDL 61 10/26/2016   LDLCALC 79 10/26/2016   TRIG 138 10/26/2016    Significant Diagnostic Results in last 30 days:  Ct Pelvis W Contrast  Result Date: 05/18/2017 CLINICAL DATA:  84 y/o F; fall on right side. Right hip replacement 2010. EXAM: CT PELVIS WITH CONTRAST TECHNIQUE: Multidetector CT imaging of the pelvis was performed using the standard protocol following the bolus administration of intravenous contrast. CONTRAST:  145mL OMNIPAQUE IOHEXOL 300 MG/ML  SOLN COMPARISON:  03/04/2008 pelvis radiographs. FINDINGS: Urinary Tract:  No abnormality visualized. Bowel:  Unremarkable  visualized pelvic bowel loops. Vascular/Lymphatic: Calcific atherosclerosis of the aorta. Reproductive:  Calcified uterine myomas measuring up to 21 mm. Other: Ill-defined contusion/hematoma in the soft tissues overlying the right hip with a blush contrast extravasation indicating active hemorrhage. Musculoskeletal: Acute comminuted and minimally displaced fractures of the right  superior and inferior pubic rami. Chronic fracture deformity of the right posterior inferior pubic rami. Minimally displaced acute fractures at the margins of left pubic symphysis. Nondisplaced fracture of the right iliac crest. 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 IMPRESSION: 1. Ill-defined contusion/hematoma in the soft tissues overlying the right hip. Blush of contrast indicates active hemorrhage. 2. Acute comminuted and mildly displaced fractures of the right superior and inferior pubic rami. 3. Minimally displaced acute fractures of the margins of left pubic symphysis. 4. Nondisplaced acute fracture of the right iliac crest. 5. 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 6. Aortic atherosclerosis. 7. Myomatous uterus. These results were called by telephone at the time of interpretation on 05/18/2017 at 8:54 pm to Dr. Deno Etienne , who verbally acknowledged these results. Electronically Signed   By: Kristine Garbe M.D.   On: 05/18/2017 20:56   Dg Hip Unilat W Or Wo Pelvis 2-3 Views Right  Result Date: 05/18/2017 CLINICAL DATA:  Fall with right hip pain. Right hip replacement 7 years ago. EXAM: DG HIP (WITH OR WITHOUT PELVIS) 2-3V RIGHT COMPARISON:  02/28/2008 FINDINGS: Right hip arthroplasty intact. Moderate osteoarthritic change of the left hip. Old right inferior pubic ramus fracture. Acute displaced fracture involving the right superior pubic ramus extending to the symphysis. Degenerate change of the spine. IMPRESSION: Acute displaced fracture of  the right superior pubic ramus extending to the symphysis. Electronically Signed   By: Marin Olp M.D.   On: 05/18/2017 18:49    Assessment/Plan  1. Closed fracture of multiple rami of right pubis with routine healing, subsequent encounter She has made very minimal gains.  She appears have quite a bit of anxiety about getting up. See below. Noncompliance and lack of motivation also are contributing factors to her lack of progress. I encouraged her to listen to the therapy dept and engage more with them and follow her exercise program. Due to her immobility she continues to need anticoagulation. Continue Eliquis at 2.5 mg BID.  Her pain appears to be well controlled with scheduled oxycodone and robaxin.  Continue leg exercises and compression hose.   2. Anxiety Schedule Ativan 0.25 mg qam prior to therapy  3. Slow transit constipation Controlled  Continue prn miralax  4. Hypercholesterolemia Needs FLP per Dr. Mariea Clonts notes.   Family/ staff Communication: NA  Labs/tests ordered:  CBC BMP FLP 5/27

## 2017-06-03 DIAGNOSIS — M5387 Other specified dorsopathies, lumbosacral region: Secondary | ICD-10-CM | POA: Diagnosis not present

## 2017-06-03 DIAGNOSIS — M62562 Muscle wasting and atrophy, not elsewhere classified, left lower leg: Secondary | ICD-10-CM | POA: Diagnosis not present

## 2017-06-03 DIAGNOSIS — M25551 Pain in right hip: Secondary | ICD-10-CM | POA: Diagnosis not present

## 2017-06-03 DIAGNOSIS — M62561 Muscle wasting and atrophy, not elsewhere classified, right lower leg: Secondary | ICD-10-CM | POA: Diagnosis not present

## 2017-06-03 DIAGNOSIS — M5489 Other dorsalgia: Secondary | ICD-10-CM | POA: Diagnosis not present

## 2017-06-03 DIAGNOSIS — R2689 Other abnormalities of gait and mobility: Secondary | ICD-10-CM | POA: Diagnosis not present

## 2017-06-06 DIAGNOSIS — M5489 Other dorsalgia: Secondary | ICD-10-CM | POA: Diagnosis not present

## 2017-06-06 DIAGNOSIS — M62561 Muscle wasting and atrophy, not elsewhere classified, right lower leg: Secondary | ICD-10-CM | POA: Diagnosis not present

## 2017-06-06 DIAGNOSIS — R2689 Other abnormalities of gait and mobility: Secondary | ICD-10-CM | POA: Diagnosis not present

## 2017-06-06 DIAGNOSIS — M5387 Other specified dorsopathies, lumbosacral region: Secondary | ICD-10-CM | POA: Diagnosis not present

## 2017-06-06 DIAGNOSIS — M25551 Pain in right hip: Secondary | ICD-10-CM | POA: Diagnosis not present

## 2017-06-06 DIAGNOSIS — D649 Anemia, unspecified: Secondary | ICD-10-CM | POA: Diagnosis not present

## 2017-06-06 DIAGNOSIS — D464 Refractory anemia, unspecified: Secondary | ICD-10-CM | POA: Diagnosis not present

## 2017-06-06 DIAGNOSIS — M62562 Muscle wasting and atrophy, not elsewhere classified, left lower leg: Secondary | ICD-10-CM | POA: Diagnosis not present

## 2017-06-06 LAB — CBC AND DIFFERENTIAL
HCT: 36 (ref 36–46)
Hemoglobin: 11.8 — AB (ref 12.0–16.0)
Platelets: 315 (ref 150–399)
WBC: 10.1

## 2017-06-07 DIAGNOSIS — R2689 Other abnormalities of gait and mobility: Secondary | ICD-10-CM | POA: Diagnosis not present

## 2017-06-07 DIAGNOSIS — M5489 Other dorsalgia: Secondary | ICD-10-CM | POA: Diagnosis not present

## 2017-06-07 DIAGNOSIS — M25551 Pain in right hip: Secondary | ICD-10-CM | POA: Diagnosis not present

## 2017-06-07 DIAGNOSIS — M62562 Muscle wasting and atrophy, not elsewhere classified, left lower leg: Secondary | ICD-10-CM | POA: Diagnosis not present

## 2017-06-07 DIAGNOSIS — M5387 Other specified dorsopathies, lumbosacral region: Secondary | ICD-10-CM | POA: Diagnosis not present

## 2017-06-07 DIAGNOSIS — M62561 Muscle wasting and atrophy, not elsewhere classified, right lower leg: Secondary | ICD-10-CM | POA: Diagnosis not present

## 2017-06-08 DIAGNOSIS — M25551 Pain in right hip: Secondary | ICD-10-CM | POA: Diagnosis not present

## 2017-06-08 DIAGNOSIS — R2689 Other abnormalities of gait and mobility: Secondary | ICD-10-CM | POA: Diagnosis not present

## 2017-06-08 DIAGNOSIS — M62562 Muscle wasting and atrophy, not elsewhere classified, left lower leg: Secondary | ICD-10-CM | POA: Diagnosis not present

## 2017-06-08 DIAGNOSIS — M62561 Muscle wasting and atrophy, not elsewhere classified, right lower leg: Secondary | ICD-10-CM | POA: Diagnosis not present

## 2017-06-08 DIAGNOSIS — M5489 Other dorsalgia: Secondary | ICD-10-CM | POA: Diagnosis not present

## 2017-06-08 DIAGNOSIS — M5387 Other specified dorsopathies, lumbosacral region: Secondary | ICD-10-CM | POA: Diagnosis not present

## 2017-06-09 DIAGNOSIS — M5489 Other dorsalgia: Secondary | ICD-10-CM | POA: Diagnosis not present

## 2017-06-09 DIAGNOSIS — M25551 Pain in right hip: Secondary | ICD-10-CM | POA: Diagnosis not present

## 2017-06-09 DIAGNOSIS — M5387 Other specified dorsopathies, lumbosacral region: Secondary | ICD-10-CM | POA: Diagnosis not present

## 2017-06-09 DIAGNOSIS — M62562 Muscle wasting and atrophy, not elsewhere classified, left lower leg: Secondary | ICD-10-CM | POA: Diagnosis not present

## 2017-06-09 DIAGNOSIS — R2689 Other abnormalities of gait and mobility: Secondary | ICD-10-CM | POA: Diagnosis not present

## 2017-06-09 DIAGNOSIS — M62561 Muscle wasting and atrophy, not elsewhere classified, right lower leg: Secondary | ICD-10-CM | POA: Diagnosis not present

## 2017-06-10 DIAGNOSIS — M5387 Other specified dorsopathies, lumbosacral region: Secondary | ICD-10-CM | POA: Diagnosis not present

## 2017-06-10 DIAGNOSIS — M62562 Muscle wasting and atrophy, not elsewhere classified, left lower leg: Secondary | ICD-10-CM | POA: Diagnosis not present

## 2017-06-10 DIAGNOSIS — R2689 Other abnormalities of gait and mobility: Secondary | ICD-10-CM | POA: Diagnosis not present

## 2017-06-10 DIAGNOSIS — M25551 Pain in right hip: Secondary | ICD-10-CM | POA: Diagnosis not present

## 2017-06-10 DIAGNOSIS — M5489 Other dorsalgia: Secondary | ICD-10-CM | POA: Diagnosis not present

## 2017-06-10 DIAGNOSIS — M62561 Muscle wasting and atrophy, not elsewhere classified, right lower leg: Secondary | ICD-10-CM | POA: Diagnosis not present

## 2017-06-13 DIAGNOSIS — M5387 Other specified dorsopathies, lumbosacral region: Secondary | ICD-10-CM | POA: Diagnosis not present

## 2017-06-13 DIAGNOSIS — Z4789 Encounter for other orthopedic aftercare: Secondary | ICD-10-CM | POA: Diagnosis not present

## 2017-06-13 DIAGNOSIS — M810 Age-related osteoporosis without current pathological fracture: Secondary | ICD-10-CM | POA: Diagnosis not present

## 2017-06-13 DIAGNOSIS — M62561 Muscle wasting and atrophy, not elsewhere classified, right lower leg: Secondary | ICD-10-CM | POA: Diagnosis not present

## 2017-06-13 DIAGNOSIS — R102 Pelvic and perineal pain: Secondary | ICD-10-CM | POA: Diagnosis not present

## 2017-06-13 DIAGNOSIS — R278 Other lack of coordination: Secondary | ICD-10-CM | POA: Diagnosis not present

## 2017-06-13 DIAGNOSIS — M84454S Pathological fracture, pelvis, sequela: Secondary | ICD-10-CM | POA: Diagnosis not present

## 2017-06-13 DIAGNOSIS — R2689 Other abnormalities of gait and mobility: Secondary | ICD-10-CM | POA: Diagnosis not present

## 2017-06-13 DIAGNOSIS — M5489 Other dorsalgia: Secondary | ICD-10-CM | POA: Diagnosis not present

## 2017-06-13 DIAGNOSIS — M25551 Pain in right hip: Secondary | ICD-10-CM | POA: Diagnosis not present

## 2017-06-13 DIAGNOSIS — M62562 Muscle wasting and atrophy, not elsewhere classified, left lower leg: Secondary | ICD-10-CM | POA: Diagnosis not present

## 2017-06-13 DIAGNOSIS — Z9181 History of falling: Secondary | ICD-10-CM | POA: Diagnosis not present

## 2017-06-13 DIAGNOSIS — S32511S Fracture of superior rim of right pubis, sequela: Secondary | ICD-10-CM | POA: Diagnosis not present

## 2017-06-13 DIAGNOSIS — W1831XS Fall on same level due to stepping on an object, sequela: Secondary | ICD-10-CM | POA: Diagnosis not present

## 2017-06-13 DIAGNOSIS — S3289XS Fracture of other parts of pelvis, sequela: Secondary | ICD-10-CM | POA: Diagnosis not present

## 2017-06-14 DIAGNOSIS — M62562 Muscle wasting and atrophy, not elsewhere classified, left lower leg: Secondary | ICD-10-CM | POA: Diagnosis not present

## 2017-06-14 DIAGNOSIS — M5489 Other dorsalgia: Secondary | ICD-10-CM | POA: Diagnosis not present

## 2017-06-14 DIAGNOSIS — M5387 Other specified dorsopathies, lumbosacral region: Secondary | ICD-10-CM | POA: Diagnosis not present

## 2017-06-14 DIAGNOSIS — M62561 Muscle wasting and atrophy, not elsewhere classified, right lower leg: Secondary | ICD-10-CM | POA: Diagnosis not present

## 2017-06-14 DIAGNOSIS — M25551 Pain in right hip: Secondary | ICD-10-CM | POA: Diagnosis not present

## 2017-06-14 DIAGNOSIS — R2689 Other abnormalities of gait and mobility: Secondary | ICD-10-CM | POA: Diagnosis not present

## 2017-06-15 DIAGNOSIS — M25551 Pain in right hip: Secondary | ICD-10-CM | POA: Diagnosis not present

## 2017-06-15 DIAGNOSIS — M62561 Muscle wasting and atrophy, not elsewhere classified, right lower leg: Secondary | ICD-10-CM | POA: Diagnosis not present

## 2017-06-15 DIAGNOSIS — M5387 Other specified dorsopathies, lumbosacral region: Secondary | ICD-10-CM | POA: Diagnosis not present

## 2017-06-15 DIAGNOSIS — M5489 Other dorsalgia: Secondary | ICD-10-CM | POA: Diagnosis not present

## 2017-06-15 DIAGNOSIS — R2689 Other abnormalities of gait and mobility: Secondary | ICD-10-CM | POA: Diagnosis not present

## 2017-06-15 DIAGNOSIS — M62562 Muscle wasting and atrophy, not elsewhere classified, left lower leg: Secondary | ICD-10-CM | POA: Diagnosis not present

## 2017-06-16 DIAGNOSIS — M170 Bilateral primary osteoarthritis of knee: Secondary | ICD-10-CM | POA: Diagnosis not present

## 2017-06-16 DIAGNOSIS — M5387 Other specified dorsopathies, lumbosacral region: Secondary | ICD-10-CM | POA: Diagnosis not present

## 2017-06-16 DIAGNOSIS — E119 Type 2 diabetes mellitus without complications: Secondary | ICD-10-CM | POA: Diagnosis not present

## 2017-06-16 DIAGNOSIS — M62562 Muscle wasting and atrophy, not elsewhere classified, left lower leg: Secondary | ICD-10-CM | POA: Diagnosis not present

## 2017-06-16 DIAGNOSIS — E1122 Type 2 diabetes mellitus with diabetic chronic kidney disease: Secondary | ICD-10-CM | POA: Diagnosis not present

## 2017-06-16 DIAGNOSIS — M5489 Other dorsalgia: Secondary | ICD-10-CM | POA: Diagnosis not present

## 2017-06-16 DIAGNOSIS — M62561 Muscle wasting and atrophy, not elsewhere classified, right lower leg: Secondary | ICD-10-CM | POA: Diagnosis not present

## 2017-06-16 DIAGNOSIS — M25551 Pain in right hip: Secondary | ICD-10-CM | POA: Diagnosis not present

## 2017-06-16 DIAGNOSIS — R2689 Other abnormalities of gait and mobility: Secondary | ICD-10-CM | POA: Diagnosis not present

## 2017-06-16 DIAGNOSIS — M9665 Fracture of pelvis following insertion of orthopedic implant, joint prosthesis, or bone plate: Secondary | ICD-10-CM | POA: Diagnosis not present

## 2017-06-16 LAB — HEMOGLOBIN A1C: Hemoglobin A1C: 6.1

## 2017-06-17 DIAGNOSIS — M25551 Pain in right hip: Secondary | ICD-10-CM | POA: Diagnosis not present

## 2017-06-17 DIAGNOSIS — M62562 Muscle wasting and atrophy, not elsewhere classified, left lower leg: Secondary | ICD-10-CM | POA: Diagnosis not present

## 2017-06-17 DIAGNOSIS — M5489 Other dorsalgia: Secondary | ICD-10-CM | POA: Diagnosis not present

## 2017-06-17 DIAGNOSIS — R2689 Other abnormalities of gait and mobility: Secondary | ICD-10-CM | POA: Diagnosis not present

## 2017-06-17 DIAGNOSIS — M5387 Other specified dorsopathies, lumbosacral region: Secondary | ICD-10-CM | POA: Diagnosis not present

## 2017-06-17 DIAGNOSIS — M62561 Muscle wasting and atrophy, not elsewhere classified, right lower leg: Secondary | ICD-10-CM | POA: Diagnosis not present

## 2017-06-19 DIAGNOSIS — M62561 Muscle wasting and atrophy, not elsewhere classified, right lower leg: Secondary | ICD-10-CM | POA: Diagnosis not present

## 2017-06-19 DIAGNOSIS — M25551 Pain in right hip: Secondary | ICD-10-CM | POA: Diagnosis not present

## 2017-06-19 DIAGNOSIS — M5387 Other specified dorsopathies, lumbosacral region: Secondary | ICD-10-CM | POA: Diagnosis not present

## 2017-06-19 DIAGNOSIS — R2689 Other abnormalities of gait and mobility: Secondary | ICD-10-CM | POA: Diagnosis not present

## 2017-06-19 DIAGNOSIS — M62562 Muscle wasting and atrophy, not elsewhere classified, left lower leg: Secondary | ICD-10-CM | POA: Diagnosis not present

## 2017-06-19 DIAGNOSIS — M5489 Other dorsalgia: Secondary | ICD-10-CM | POA: Diagnosis not present

## 2017-06-20 DIAGNOSIS — M25551 Pain in right hip: Secondary | ICD-10-CM | POA: Diagnosis not present

## 2017-06-20 DIAGNOSIS — R2689 Other abnormalities of gait and mobility: Secondary | ICD-10-CM | POA: Diagnosis not present

## 2017-06-20 DIAGNOSIS — M5387 Other specified dorsopathies, lumbosacral region: Secondary | ICD-10-CM | POA: Diagnosis not present

## 2017-06-20 DIAGNOSIS — M5489 Other dorsalgia: Secondary | ICD-10-CM | POA: Diagnosis not present

## 2017-06-20 DIAGNOSIS — M62562 Muscle wasting and atrophy, not elsewhere classified, left lower leg: Secondary | ICD-10-CM | POA: Diagnosis not present

## 2017-06-20 DIAGNOSIS — M62561 Muscle wasting and atrophy, not elsewhere classified, right lower leg: Secondary | ICD-10-CM | POA: Diagnosis not present

## 2017-06-20 NOTE — Telephone Encounter (Signed)
I left a message asking the pt to call me at [phone] to schedule AWV with Clarise Cruz at [place] on 06/28/17 if available. VDM (DD)

## 2017-06-21 ENCOUNTER — Encounter: Payer: Self-pay | Admitting: Internal Medicine

## 2017-06-21 ENCOUNTER — Non-Acute Institutional Stay (SKILLED_NURSING_FACILITY): Payer: Medicare Other | Admitting: Internal Medicine

## 2017-06-21 DIAGNOSIS — N183 Chronic kidney disease, stage 3 unspecified: Secondary | ICD-10-CM

## 2017-06-21 DIAGNOSIS — E663 Overweight: Secondary | ICD-10-CM | POA: Diagnosis not present

## 2017-06-21 DIAGNOSIS — F419 Anxiety disorder, unspecified: Secondary | ICD-10-CM | POA: Diagnosis not present

## 2017-06-21 DIAGNOSIS — M62562 Muscle wasting and atrophy, not elsewhere classified, left lower leg: Secondary | ICD-10-CM | POA: Diagnosis not present

## 2017-06-21 DIAGNOSIS — R2689 Other abnormalities of gait and mobility: Secondary | ICD-10-CM | POA: Diagnosis not present

## 2017-06-21 DIAGNOSIS — R410 Disorientation, unspecified: Secondary | ICD-10-CM

## 2017-06-21 DIAGNOSIS — Z7901 Long term (current) use of anticoagulants: Secondary | ICD-10-CM | POA: Diagnosis not present

## 2017-06-21 DIAGNOSIS — M5489 Other dorsalgia: Secondary | ICD-10-CM | POA: Diagnosis not present

## 2017-06-21 DIAGNOSIS — E7800 Pure hypercholesterolemia, unspecified: Secondary | ICD-10-CM | POA: Diagnosis not present

## 2017-06-21 DIAGNOSIS — Z6827 Body mass index (BMI) 27.0-27.9, adult: Secondary | ICD-10-CM

## 2017-06-21 DIAGNOSIS — M5387 Other specified dorsopathies, lumbosacral region: Secondary | ICD-10-CM | POA: Diagnosis not present

## 2017-06-21 DIAGNOSIS — M810 Age-related osteoporosis without current pathological fracture: Secondary | ICD-10-CM

## 2017-06-21 DIAGNOSIS — E1122 Type 2 diabetes mellitus with diabetic chronic kidney disease: Secondary | ICD-10-CM

## 2017-06-21 DIAGNOSIS — M62561 Muscle wasting and atrophy, not elsewhere classified, right lower leg: Secondary | ICD-10-CM | POA: Diagnosis not present

## 2017-06-21 DIAGNOSIS — S32810A Multiple fractures of pelvis with stable disruption of pelvic ring, initial encounter for closed fracture: Secondary | ICD-10-CM

## 2017-06-21 DIAGNOSIS — D62 Acute posthemorrhagic anemia: Secondary | ICD-10-CM

## 2017-06-21 DIAGNOSIS — M25551 Pain in right hip: Secondary | ICD-10-CM | POA: Diagnosis not present

## 2017-06-21 NOTE — Progress Notes (Signed)
Patient ID: Kelsey Salinas, female   DOB: Mar 07, 1926, 84 y.o.   MRN: [id number]  Location:  [place] Room Number: [place] of Service:  SNF [phone]) Provider:   Gayland Curry, DO  Patient Care Team: Gayland Curry, DO as PCP - General (Geriatric Medicine) Belva Crome, MD as PCP - Cardiology (Cardiology) Gaynelle Arabian, MD as Consulting Physician (Orthopedic Surgery) Belva Crome, MD as Consulting Physician (Cardiology) Monna Fam, MD as Consulting Physician (Ophthalmology) Elsie Saas, MD as Consulting Physician (Orthopedic Surgery)  Extended Emergency Contact Information Primary Emergency Contact: [place] of [place] Phone: [phone] Relation: Daughter Secondary Emergency Contact: Niyah, [place] Mobile Phone: [phone] Relation: Niece  Code Status:  Full code Goals of care: Advanced Directive information Advanced Directives 06/21/2017  Does Patient Have a Medical Advance Directive? Yes  Type of Paramedic of [place];Living will  Does patient want to make changes to medical advance directive? No - Patient declined  Copy of [place] in Chart? Yes  Would patient like information on creating a medical advance directive? -  Pre-existing out of facility DNR order (yellow form or pink MOST form) -   Chief Complaint  Patient presents with  . Medical Management of Chronic Issues    30mth follow-up in Rehab    HPI:  Pt is a 84 y.o. female seen today for medical management of chronic diseases--she has been in rehab for just about one month since her fall at home with pelvic fracture.  At first, she was not participating well in therapy, but since has made great strides.  She was started on eliquis for DVT prophylaxis due to her immobility which will be continued for 6 weeks or at least until she's up on her own.  She reports that Dr. Wynelle Link was quite  happy with her progress.  She is now up walking with PT.  She says her biggest problem has been fear of another fall. It can take her up to 3 minutes to work up the gumption to stand up with her walker even with PT and nursing staff.  She had initially been on as needed ativan for this, but is now on once a day ativan each morning before therapy which has helped.  Staff had reported more confusion which certainly could be related to her ativan, oxycodone and robaxin.  She is also still on oxycodone 5mg  po bid and robaxin 500mg  po bid routinely.  She denies pain unless her right lateral hip and iliac crest region is touching during turning.  She was doing well elevating her feet in bed when I entered during her PT session.    Her hba1c is now 6.1 on 6/6 which is up from the last at 5.8 but still good.  She has lost some weight in rehab. Weight at admission was 181 lbs and she is now 175lbs. She remains on metformin just 250mg  po daily with her largest meal.  This alone has been effective along with improved diet.  She is also on an ARB, baby asa and lipitor.  LDL was 79 2 weeks ago with goal <70.  May improve if she continues to lose some weight.  We were hesitant to begin the eliquis due to a drop in her hgb during her hospital stay and a hematoma noted in the pelvis; however, h/h has been monitored and stable in the 11 range-last was 11.8 on 5/27.    Past  Medical History:  Diagnosis Date  . Childhood asthma   . Chronic diastolic heart failure [facility]) 12/25/2012   October 2013 echocardiogram 1. There is moderate concentric left ventricle hypertrophy. 2. Left ventricular ejection fraction estimated by 2D at 60-65 percent. 3. There were no regional wall motion abnormalities. 4. Mild mitral annular calcification. 5. Mild mitral valve regurgitation. 6. There is mild tricuspid regurgitation. 7. Mildly elevated estimated right ventricular systolic pressure. 8. Right ventricular systolic pressure estimated at 35-40  mm Hg. 9. Mild calcification of the aortic valve. 10. Trace aortic valve regurgitation. 11. Analysis of mitral valve inflow, pulmonary vein Doppler and tissue Doppler suggests grade I diastolic dysfunction without elevated left atrial pressure.   . Colon polyp   . Diverticular stricture ([place]) 2009  . Diverticulosis 10-30-2007  . Edema 09/12/13  . Fall 05/18/2017   tripped over wheel of walker; superior and inferior pubic ramus fractures   . Hypercholesterolemia   . Hypertension   . Hypertension, essential   . Hypothyroidism    as a teenager  . Indigestion   . Insomnia   . OA (osteoarthritis) of hip    right hip replacement, OA in bilateral knees  . Osteopenia    Actonel stopped in 2012 after 5years therapy  . Pneumonia 1939   "hospitalized"  . Rapid resting heart rate 96/22/2979   Uncertain cause. Possible ectopic atrial tachycardia. Date entered 12/25/12   . Right rotator cuff tear   . Seasonal allergies    Past Surgical History:  Procedure Laterality Date  . CATARACT EXTRACTION W/ INTRAOCULAR LENS  IMPLANT, BILATERAL Bilateral   . COLONOSCOPY  2009   Dr. Redmond [facility]  . DILATION AND CURETTAGE OF UTERUS    . FRACTURE SURGERY    . JOINT REPLACEMENT    . ORIF WRIST FRACTURE Right   . REDUCTION MAMMAPLASTY  1999  . TOTAL HIP ARTHROPLASTY Right 03-04-08   Dr. Wynelle Link    Allergies  Allergen Reactions  . Amlodipine Besylate Other (See Comments)    Edema  . Lisinopril Cough    Cough   . Sulfamethoxazole-Trimethoprim Other (See Comments)    Unknown reaction     Outpatient Encounter Medications as of 06/21/2017  Medication Sig  . alendronate (FOSAMAX) 70 MG tablet Take 70 mg by mouth once a week. Take with a full glass of water on an empty stomach.  Marland Kitchen aspirin EC 81 MG tablet Take 81 mg by mouth daily.  Marland Kitchen atorvastatin (LIPITOR) 10 MG tablet Take 10 mg by mouth daily. For cholesterol  . Cholecalciferol (VITAMIN D PO) Take 1 tablet by mouth every 7 (seven) days. 50,000 units  .  diclofenac sodium (VOLTAREN) 1 % GEL Apply 2 g topically every 6 (six) hours as needed.  . famotidine (PEPCID) 20 MG tablet Take 20 mg by mouth daily.  . hydrocortisone 2.5 % cream Apply 1 application topically as needed.   Marland Kitchen LORazepam (ATIVAN) 0.5 MG tablet Take 0.25 mg by mouth every morning. Before therapy  . losartan-hydrochlorothiazide (HYZAAR) 100-25 MG tablet Take 1 tablet by mouth daily.  . metFORMIN (GLUCOPHAGE) 500 MG tablet TAKE (1/2) TABLET DAILY.  . methocarbamol (ROBAXIN) 500 MG tablet Take 500 mg by mouth 2 (two) times daily.  . metoprolol succinate (TOPROL-XL) 100 MG 24 hr tablet Take 150 mg by mouth 2 (two) times daily. Take with or immediately following a meal.  . oxyCODONE (OXY IR/ROXICODONE) 5 MG immediate release tablet Take 5 mg by mouth every 4 (four) hours as needed for  moderate pain or severe pain.  Marland Kitchen oxyCODONE (OXY IR/ROXICODONE) 5 MG immediate release tablet Take 5 mg by mouth 2 (two) times daily.  . polyethylene glycol (MIRALAX / GLYCOLAX) packet Take 17 g by mouth daily as needed for mild constipation.  . [DISCONTINUED] LORazepam (ATIVAN) 0.5 MG tablet Take 0.5 tablets (0.25 mg total) by mouth every 8 (eight) hours as needed for anxiety. (Patient taking differently: Take 0.25 mg by mouth every morning. And q 8 hrs prn anxiety)  . [DISCONTINUED] methocarbamol (ROBAXIN) 500 MG tablet Take 1 tablet (500 mg total) by mouth every 8 (eight) hours as needed for muscle spasms. (Patient taking differently: Take 500 mg by mouth 2 (two) times daily. BID and q 8 hrs prn)  . [DISCONTINUED] metoprolol succinate (TOPROL-XL) 100 MG 24 hr tablet Take one and a half (1 and 1/2) tablets (150 mg) by mouth twice daily.  . [DISCONTINUED] apixaban (ELIQUIS) 2.5 MG TABS tablet Take 2.5 mg by mouth 2 (two) times daily.  . [DISCONTINUED] bisacodyl (DULCOLAX) 10 MG suppository Place 10 mg rectally as needed for moderate constipation.  . [DISCONTINUED] oxyCODONE (OXY IR/ROXICODONE) 5 MG immediate  release tablet Take 1 tablet (5 mg total) by mouth every 4 (four) hours as needed for moderate pain. (Patient taking differently: Take 5 mg by mouth 2 (two) times daily. BID and q 4 hrs prn)   No facility-administered encounter medications on file as of 06/21/2017.     Review of Systems  Constitutional: Positive for activity change. Negative for appetite change, chills, fatigue and fever.  HENT: Negative for congestion, sore throat and voice change.   Eyes: Negative for visual disturbance.       Glasses  Respiratory: Negative for cough, chest tightness and shortness of breath.        NO cough whatsoever right now  Cardiovascular: Negative for chest pain and leg swelling.       Wearing teds  Gastrointestinal: Negative for abdominal pain, blood in stool, constipation, diarrhea and nausea.  Genitourinary: Negative for dysuria.  Musculoskeletal: Positive for gait problem. Negative for myalgias.       Only tenderness, no pain  Neurological: Negative for dizziness and weakness.       Some tingling in feet at night  Hematological: Bruises/bleeds easily.  Psychiatric/Behavioral: Negative for agitation, behavioral problems, confusion and sleep disturbance. The patient is nervous/anxious.        Did not recall that I saw her at the time of admission    Immunization History  Administered Date(s) Administered  . Influenza-Unspecified 10/25/2013, 10/31/2014, 11/06/2015, 11/01/2016  . Pneumococcal Conjugate-13 06/04/2014   Pertinent  Health Maintenance Due  Topic Date Due  . FOOT EXAM  09/02/1938  . OPHTHALMOLOGY EXAM  09/02/1938  . PNA vac Low Risk Adult (2 of 2 - PPSV23) 06/04/2015  . INFLUENZA VACCINE  08/11/2017  . HEMOGLOBIN A1C  11/19/2017  . DEXA SCAN  Completed   Fall Risk  12/01/2016 11/03/2016 06/16/2016 04/07/2016 10/01/2015  Falls in the past year? No No No No No   Functional Status Survey:    Vitals:   06/21/17 1035  BP: 120/79  Pulse: 99  Resp: 18  Temp: (!) 97.5 F (36.4  C)  TempSrc: Oral  SpO2: 93%  Weight: 175 lb (79.4 kg)  Height: 5\' 7"  (1.702 m)   Body mass index is 27.41 kg/m. Physical Exam  Constitutional: She is oriented to person, place, and time. She appears well-developed and well-nourished. No distress.  Eyes:  glasses  Cardiovascular: Normal rate, regular rhythm, normal heart sounds and intact distal pulses.  Pulmonary/Chest: Effort normal and breath sounds normal. She has no rales.  Abdominal: Soft. Bowel sounds are normal. She exhibits no distension. There is no tenderness. There is no rebound and no guarding.  Musculoskeletal: Normal range of motion. She exhibits tenderness.  Tender over right ASIS  Neurological: She is alert and oriented to person, place, and time.  Skin: Skin is warm and dry. Capillary refill takes less than 2 seconds.  Psychiatric: She has a normal mood and affect.    Labs reviewed: Recent Labs    05/18/17 1900 05/18/17 1918 05/19/17 0434 06/02/17 1111  NA 140 141 140 137  K 3.3* 3.3* 4.2 4.0  CL 103 103 106  --   CO2 26  --  27  --   GLUCOSE 108* 103* 132*  --   BUN 20 23* 22* 30*  CREATININE 1.05* 1.00 1.07* 0.9  CALCIUM 8.9  --  8.8*  --   MG 1.8  --   --   --    Recent Labs    05/19/17 0434  ALBUMIN 3.4*   Recent Labs    05/18/17 1900  05/19/17 0434 05/20/17 0450 05/30/17 06/06/17 0300  WBC 13.9*  --  9.0 10.0 9.0 10.1  NEUTROABS 10.9*  --   --   --   --   --   HGB 13.5   < > 12.3 11.2* 11.6* 11.8*  HCT 42.6   < > 39.4 35.9* 35* 36  MCV 95.7  --  95.9 96.2  --   --   PLT 169  --  167 130* 283 315   < > = values in this interval not displayed.   Lab Results  Component Value Date   TSH 0.61 03/25/2015   Lab Results  Component Value Date   HGBA1C 6.1 06/16/2017   Lab Results  Component Value Date   CHOL 156 06/02/2017   HDL 52 06/02/2017   LDLCALC 79 06/02/2017   TRIG 127 06/02/2017    Assessment/Plan 1. Multiple closed fractures of pelvis with stable disruption of pelvic  ring, initial encounter ([place]) -doing very well now with therapy, up walking with walker with therapists present and to bathroom with CNAs -only painful when right iliac crest region touched or she rolls on it -will plan to try to taper down her oxycodone 5mg --change to bid PRN today from bid since she says she has no pain even doing therapy; also change to robaxin to 500mg  po bid PRN muscle spasms (previously had neck pains and used muscle relaxers for that)  2. Type 2 diabetes mellitus with stage 3 chronic kidney disease, without long-term current use of insulin (HCC) -cont low dose metformin once a day, ARB, ASA, statin -avoid nsaids with her renal function  3. Hypercholesterolemia -cont lipitor, if not at goal when recheck in clinic, would up dose to at least 20mg  from 10mg  as tolerated -cont to work on diet to lose some more weight  4. Anxiety -cont ativan just once a day before therapy which is helping, pt working on getting more comfortable standing, but is not to a point where she has been permitted to do this on her own  5. Confusion -did not recall seeing me or Alyse Low, suspect was delirious from meds and may still have some confusion from this so will work to get her off of cognition-altering meds as able  6. Senile osteoporosis -cont alendronate at  this point, vitamin D3, but I'd like to change her medication at a f/u visit due to her fracturing while on it (prolia or anabolic)  7. Acute blood loss anemia -stable now even on eliquis -would f/u h/h once more prior to d/c home  8. Anticoagulated by anticoagulation treatment -cont eliquis for a 6 week course or until patient is up and walking on her own w/o PT or nursing  9.  Overweight with BMI of 25-29 (27) Noted, she is working on this with improved diet, when able to ambulate on her own, would expect her to lose just another 5-10 lbs to be at a satisfactory weight by [employer] staff Communication: discussed with nurse  manager and PT  Labs/tests ordered:  No new  Ziara Thelander L. Trayveon Beckford, D.O. [facility] [address], [place] [address] Cell Phone (Mon-Fri 8am-5pm):  [phone] On Call:  [phone] & follow prompts after 5pm & weekends Office Phone:  [phone] Office Fax:  [phone]

## 2017-06-22 DIAGNOSIS — M5387 Other specified dorsopathies, lumbosacral region: Secondary | ICD-10-CM | POA: Diagnosis not present

## 2017-06-22 DIAGNOSIS — M62561 Muscle wasting and atrophy, not elsewhere classified, right lower leg: Secondary | ICD-10-CM | POA: Diagnosis not present

## 2017-06-22 DIAGNOSIS — M62562 Muscle wasting and atrophy, not elsewhere classified, left lower leg: Secondary | ICD-10-CM | POA: Diagnosis not present

## 2017-06-22 DIAGNOSIS — R2689 Other abnormalities of gait and mobility: Secondary | ICD-10-CM | POA: Diagnosis not present

## 2017-06-22 DIAGNOSIS — M25551 Pain in right hip: Secondary | ICD-10-CM | POA: Diagnosis not present

## 2017-06-22 DIAGNOSIS — M5489 Other dorsalgia: Secondary | ICD-10-CM | POA: Diagnosis not present

## 2017-06-23 DIAGNOSIS — R2689 Other abnormalities of gait and mobility: Secondary | ICD-10-CM | POA: Diagnosis not present

## 2017-06-23 DIAGNOSIS — M5387 Other specified dorsopathies, lumbosacral region: Secondary | ICD-10-CM | POA: Diagnosis not present

## 2017-06-23 DIAGNOSIS — M5489 Other dorsalgia: Secondary | ICD-10-CM | POA: Diagnosis not present

## 2017-06-23 DIAGNOSIS — M62561 Muscle wasting and atrophy, not elsewhere classified, right lower leg: Secondary | ICD-10-CM | POA: Diagnosis not present

## 2017-06-23 DIAGNOSIS — M62562 Muscle wasting and atrophy, not elsewhere classified, left lower leg: Secondary | ICD-10-CM | POA: Diagnosis not present

## 2017-06-23 DIAGNOSIS — M25551 Pain in right hip: Secondary | ICD-10-CM | POA: Diagnosis not present

## 2017-06-24 DIAGNOSIS — M5387 Other specified dorsopathies, lumbosacral region: Secondary | ICD-10-CM | POA: Diagnosis not present

## 2017-06-24 DIAGNOSIS — M62562 Muscle wasting and atrophy, not elsewhere classified, left lower leg: Secondary | ICD-10-CM | POA: Diagnosis not present

## 2017-06-24 DIAGNOSIS — M5489 Other dorsalgia: Secondary | ICD-10-CM | POA: Diagnosis not present

## 2017-06-24 DIAGNOSIS — R2689 Other abnormalities of gait and mobility: Secondary | ICD-10-CM | POA: Diagnosis not present

## 2017-06-24 DIAGNOSIS — M62561 Muscle wasting and atrophy, not elsewhere classified, right lower leg: Secondary | ICD-10-CM | POA: Diagnosis not present

## 2017-06-24 DIAGNOSIS — M25551 Pain in right hip: Secondary | ICD-10-CM | POA: Diagnosis not present

## 2017-06-27 DIAGNOSIS — M62561 Muscle wasting and atrophy, not elsewhere classified, right lower leg: Secondary | ICD-10-CM | POA: Diagnosis not present

## 2017-06-27 DIAGNOSIS — M62562 Muscle wasting and atrophy, not elsewhere classified, left lower leg: Secondary | ICD-10-CM | POA: Diagnosis not present

## 2017-06-27 DIAGNOSIS — M25551 Pain in right hip: Secondary | ICD-10-CM | POA: Diagnosis not present

## 2017-06-27 DIAGNOSIS — M5489 Other dorsalgia: Secondary | ICD-10-CM | POA: Diagnosis not present

## 2017-06-27 DIAGNOSIS — M5387 Other specified dorsopathies, lumbosacral region: Secondary | ICD-10-CM | POA: Diagnosis not present

## 2017-06-27 DIAGNOSIS — R2689 Other abnormalities of gait and mobility: Secondary | ICD-10-CM | POA: Diagnosis not present

## 2017-06-28 DIAGNOSIS — M62562 Muscle wasting and atrophy, not elsewhere classified, left lower leg: Secondary | ICD-10-CM | POA: Diagnosis not present

## 2017-06-28 DIAGNOSIS — R2689 Other abnormalities of gait and mobility: Secondary | ICD-10-CM | POA: Diagnosis not present

## 2017-06-28 DIAGNOSIS — M5489 Other dorsalgia: Secondary | ICD-10-CM | POA: Diagnosis not present

## 2017-06-28 DIAGNOSIS — M5387 Other specified dorsopathies, lumbosacral region: Secondary | ICD-10-CM | POA: Diagnosis not present

## 2017-06-28 DIAGNOSIS — M62561 Muscle wasting and atrophy, not elsewhere classified, right lower leg: Secondary | ICD-10-CM | POA: Diagnosis not present

## 2017-06-28 DIAGNOSIS — M25551 Pain in right hip: Secondary | ICD-10-CM | POA: Diagnosis not present

## 2017-06-29 DIAGNOSIS — M5387 Other specified dorsopathies, lumbosacral region: Secondary | ICD-10-CM | POA: Diagnosis not present

## 2017-06-29 DIAGNOSIS — R2689 Other abnormalities of gait and mobility: Secondary | ICD-10-CM | POA: Diagnosis not present

## 2017-06-29 DIAGNOSIS — M5489 Other dorsalgia: Secondary | ICD-10-CM | POA: Diagnosis not present

## 2017-06-29 DIAGNOSIS — M62561 Muscle wasting and atrophy, not elsewhere classified, right lower leg: Secondary | ICD-10-CM | POA: Diagnosis not present

## 2017-06-29 DIAGNOSIS — M25551 Pain in right hip: Secondary | ICD-10-CM | POA: Diagnosis not present

## 2017-06-29 DIAGNOSIS — M62562 Muscle wasting and atrophy, not elsewhere classified, left lower leg: Secondary | ICD-10-CM | POA: Diagnosis not present

## 2017-06-30 ENCOUNTER — Non-Acute Institutional Stay (SKILLED_NURSING_FACILITY): Payer: Medicare Other | Admitting: Adult Health

## 2017-06-30 ENCOUNTER — Encounter: Payer: Self-pay | Admitting: Adult Health

## 2017-06-30 DIAGNOSIS — R2689 Other abnormalities of gait and mobility: Secondary | ICD-10-CM | POA: Diagnosis not present

## 2017-06-30 DIAGNOSIS — E7800 Pure hypercholesterolemia, unspecified: Secondary | ICD-10-CM | POA: Diagnosis not present

## 2017-06-30 DIAGNOSIS — N183 Chronic kidney disease, stage 3 unspecified: Secondary | ICD-10-CM

## 2017-06-30 DIAGNOSIS — S3282XD Multiple fractures of pelvis without disruption of pelvic ring, subsequent encounter for fracture with routine healing: Secondary | ICD-10-CM

## 2017-06-30 DIAGNOSIS — E1122 Type 2 diabetes mellitus with diabetic chronic kidney disease: Secondary | ICD-10-CM | POA: Diagnosis not present

## 2017-06-30 DIAGNOSIS — M25551 Pain in right hip: Secondary | ICD-10-CM | POA: Diagnosis not present

## 2017-06-30 DIAGNOSIS — R000 Tachycardia, unspecified: Secondary | ICD-10-CM

## 2017-06-30 DIAGNOSIS — M62562 Muscle wasting and atrophy, not elsewhere classified, left lower leg: Secondary | ICD-10-CM | POA: Diagnosis not present

## 2017-06-30 DIAGNOSIS — M62561 Muscle wasting and atrophy, not elsewhere classified, right lower leg: Secondary | ICD-10-CM | POA: Diagnosis not present

## 2017-06-30 DIAGNOSIS — F419 Anxiety disorder, unspecified: Secondary | ICD-10-CM | POA: Diagnosis not present

## 2017-06-30 DIAGNOSIS — M5387 Other specified dorsopathies, lumbosacral region: Secondary | ICD-10-CM | POA: Diagnosis not present

## 2017-06-30 DIAGNOSIS — M5489 Other dorsalgia: Secondary | ICD-10-CM | POA: Diagnosis not present

## 2017-06-30 NOTE — Assessment & Plan Note (Signed)
LDL 79 Continue Lipitor 10 mg qd.

## 2017-06-30 NOTE — Assessment & Plan Note (Signed)
Controlled, continue metoprolol 150 mg BID Followed by Dr. Tamala Julian

## 2017-06-30 NOTE — Progress Notes (Signed)
Location:  Occupational psychologist of Service:  SNF (31) Provider:   Cindi Carbon, ANP [place] [phone]   Gayland Curry, DO  Patient Care Team: Gayland Curry, DO as PCP - General (Geriatric Medicine) Belva Crome, MD as PCP - Cardiology (Cardiology) Gaynelle Arabian, MD as Consulting Physician (Orthopedic Surgery) Belva Crome, MD as Consulting Physician (Cardiology) Monna Fam, MD as Consulting Physician (Ophthalmology) Elsie Saas, MD as Consulting Physician (Orthopedic Surgery)  Extended Emergency Contact Information Primary Emergency Contact: [place] of [place] Phone: [phone] Relation: Daughter Secondary Emergency Contact: R[place], [place] Mobile Phone: [phone] Relation: Niece  Code Status:  Full code Goals of care: Advanced Directive information Advanced Directives 06/21/2017  Does Patient Have a Medical Advance Directive? Yes  Type of Paramedic of [place];Living will  Does patient want to make changes to medical advance directive? No - Patient declined  Copy of [place] in Chart? Yes  Would patient like information on creating a medical advance directive? -  Pre-existing out of facility DNR order (yellow form or pink MOST form) -     Chief Complaint  Patient presents with  . Medical Management of Chronic Issues    HPI:  Pt is a 84 y.o. female seen today for follow up regarding anxiety. She is currently in rehab for therapy after a fall that resulted in multiple pelvic fractures in May of 2019. Ms Aguilera has had periods of anxiety when up with therapy and fear of falling. She was placed on scheduled ativan for this reason. Her son had felt that it was time to discontinue the scheduled dose as she was progressing in mobility.  She also is requesting that I look at her pill regimen and reduce the number of pills she takes. We  have already changed the robaxin and oxycodone to as needed and she is no longer on eliquis.   Past Medical History:  Diagnosis Date  . Childhood asthma   . Chronic diastolic heart failure [facility]) 12/25/2012   October 2013 echocardiogram 1. There is moderate concentric left ventricle hypertrophy. 2. Left ventricular ejection fraction estimated by 2D at 60-65 percent. 3. There were no regional wall motion abnormalities. 4. Mild mitral annular calcification. 5. Mild mitral valve regurgitation. 6. There is mild tricuspid regurgitation. 7. Mildly elevated estimated right ventricular systolic pressure. 8. Right ventricular systolic pressure estimated at 35-40 mm Hg. 9. Mild calcification of the aortic valve. 10. Trace aortic valve regurgitation. 11. Analysis of mitral valve inflow, pulmonary vein Doppler and tissue Doppler suggests grade I diastolic dysfunction without elevated left atrial pressure.   . Colon polyp   . Diverticular stricture ([place]) 2009  . Diverticulosis 10-30-2007  . Edema 09/12/13  . Fall 05/18/2017   tripped over wheel of walker; superior and inferior pubic ramus fractures   . Hypercholesterolemia   . Hypertension   . Hypertension, essential   . Hypothyroidism    as a teenager  . Indigestion   . Insomnia   . OA (osteoarthritis) of hip    right hip replacement, OA in bilateral knees  . Osteopenia    Actonel stopped in 2012 after 5years therapy  . Pneumonia 1939   "hospitalized"  . Rapid resting heart rate 01/75/1025   Uncertain cause. Possible ectopic atrial tachycardia. Date entered 12/25/12   . Right rotator cuff tear   . Seasonal allergies    Past Surgical History:  Procedure Laterality Date  .  CATARACT EXTRACTION W/ INTRAOCULAR LENS  IMPLANT, BILATERAL Bilateral   . COLONOSCOPY  2009   Dr. Redmond [facility]  . DILATION AND CURETTAGE OF UTERUS    . FRACTURE SURGERY    . JOINT REPLACEMENT    . ORIF WRIST FRACTURE Right   . REDUCTION MAMMAPLASTY  1999  . TOTAL HIP  ARTHROPLASTY Right 03-04-08   Dr. Wynelle Link    Allergies  Allergen Reactions  . Amlodipine Besylate Other (See Comments)    Edema  . Lisinopril Cough    Cough   . Sulfamethoxazole-Trimethoprim Other (See Comments)    Unknown reaction     Outpatient Encounter Medications as of 06/30/2017  Medication Sig  . acetaminophen (TYLENOL) 500 MG tablet Take 1,000 mg by mouth 2 (two) times daily.  Marland Kitchen alendronate (FOSAMAX) 70 MG tablet Take 70 mg by mouth once a week. Take with a full glass of water on an empty stomach.  Marland Kitchen aspirin EC 81 MG tablet Take 81 mg by mouth daily.  Marland Kitchen atorvastatin (LIPITOR) 10 MG tablet Take 10 mg by mouth daily. For cholesterol  . Cholecalciferol (VITAMIN D PO) Take 1 tablet by mouth every 7 (seven) days. 50,000 units  . diclofenac sodium (VOLTAREN) 1 % GEL Apply 2 g topically every 6 (six) hours as needed.  . famotidine (PEPCID) 20 MG tablet Take 20 mg by mouth daily.  Marland Kitchen losartan-hydrochlorothiazide (HYZAAR) 100-25 MG tablet Take 1 tablet by mouth daily.  . metFORMIN (GLUCOPHAGE) 500 MG tablet TAKE (1/2) TABLET DAILY.  . methocarbamol (ROBAXIN) 500 MG tablet Take 500 mg by mouth 2 (two) times daily as needed for muscle spasms.  . metoprolol succinate (TOPROL-XL) 100 MG 24 hr tablet Take 150 mg by mouth 2 (two) times daily. Take with or immediately following a meal.  . oxyCODONE (OXY IR/ROXICODONE) 5 MG immediate release tablet Take 5 mg by mouth 2 (two) times daily as needed for severe pain.  . polyethylene glycol (MIRALAX / GLYCOLAX) packet Take 17 g by mouth daily as needed for mild constipation.  . [DISCONTINUED] hydrocortisone 2.5 % cream Apply 1 application topically as needed.   . [DISCONTINUED] LORazepam (ATIVAN) 0.5 MG tablet Take 0.25 mg by mouth every morning. Before therapy   No facility-administered encounter medications on file as of 06/30/2017.     Review of Systems  Constitutional: Negative for activity change, appetite change, chills, diaphoresis,  fatigue, fever and unexpected weight change.  HENT: Negative for congestion.   Eyes: Negative for visual disturbance.  Respiratory: Positive for cough. Negative for shortness of breath, wheezing and stridor.   Cardiovascular: Negative for chest pain, palpitations and leg swelling.  Gastrointestinal: Negative for abdominal distention, constipation, diarrhea and nausea.  Genitourinary: Negative for difficulty urinating and dyspareunia.  Musculoskeletal: Positive for arthralgias and gait problem.  Skin: Negative for wound.  Neurological: Negative for tremors and weakness.  Psychiatric/Behavioral: Negative for confusion.    Immunization History  Administered Date(s) Administered  . Influenza-Unspecified 10/25/2013, 10/31/2014, 11/06/2015, 11/01/2016  . Pneumococcal Conjugate-13 06/04/2014   Pertinent  Health Maintenance Due  Topic Date Due  . FOOT EXAM  09/02/1936  . OPHTHALMOLOGY EXAM  09[birth year]/1938  . PNA vac Low Risk Adult (2 of 2 - PPSV23) 06/04/2015  . INFLUENZA VACCINE  08/11/2017  . HEMOGLOBIN A1C  12/16/2017  . DEXA SCAN  Completed   Fall Risk  12/01/2016 11/03/2016 06/16/2016 04/07/2016 10/01/2015  Falls in the past year? No No No No No   Functional Status Survey:    Vitals:  06/30/17 1001  BP: 124/78  Pulse: (!) 101   There is no height or weight on file to calculate BMI. Physical Exam  Constitutional: She is oriented to person, place, and time. No distress.  HENT:  Head: Normocephalic and atraumatic.  Cardiovascular: Normal rate and regular rhythm.  No murmur heard. Pulmonary/Chest: Effort normal and breath sounds normal. No respiratory distress.  Abdominal: Soft. Bowel sounds are normal.  Musculoskeletal: She exhibits no edema, tenderness or deformity.  Neurological: She is alert and oriented to person, place, and time.  Skin: Skin is warm and dry. She is not diaphoretic.  Psychiatric: She has a normal mood and affect.    Labs reviewed: Recent Labs     05/18/17 1900 05/18/17 1918 05/19/17 0434 06/02/17 1111  NA 140 141 140 137  K 3.3* 3.3* 4.2 4.0  CL 103 103 106  --   CO2 26  --  27  --   GLUCOSE 108* 103* 132*  --   BUN 20 23* 22* 30*  CREATININE 1.05* 1.00 1.07* 0.9  CALCIUM 8.9  --  8.8*  --   MG 1.8  --   --   --    Recent Labs    05/19/17 0434  ALBUMIN 3.4*   Recent Labs    05/18/17 1900  05/19/17 0434 05/20/17 0450 05/30/17 06/06/17 0300  WBC 13.9*  --  9.0 10.0 9.0 10.1  NEUTROABS 10.9*  --   --   --   --   --   HGB 13.5   < > 12.3 11.2* 11.6* 11.8*  HCT 42.6   < > 39.4 35.9* 35* 36  MCV 95.7  --  95.9 96.2  --   --   PLT 169  --  167 130* 283 315   < > = values in this interval not displayed.   Lab Results  Component Value Date   TSH 0.61 03/25/2015   Lab Results  Component Value Date   HGBA1C 6.1 06/16/2017   Lab Results  Component Value Date   CHOL 156 06/02/2017   HDL 52 06/02/2017   LDLCALC 79 06/02/2017   TRIG 127 06/02/2017    Significant Diagnostic Results in last 30 days:  No results found.  Assessment/Plan  Anxiety: Improved  May discontinue ativan and monitor for recurrence.   Pelvic fracture (HCC) Improved mobility and pain.  Continue prn robaxin and oxycodone until discharge.  Change Tylenol to 1000 mg TID prn per pt request to reduce pill burden. She has made gains in therapy and will likely be ready to return home in the next two weeks.   Hypercholesterolemia LDL 79 Continue Lipitor 10 mg qd.   Sinus tachycardia Controlled, continue metoprolol 150 mg BID Followed by Dr. Tamala Julian  Type II diabetes mellitus with stage 3 chronic kidney disease (HCC) A1C at goal 6.1 Continue metformin   CKD (chronic kidney disease) stage 3, GFR 30-59 ml/min (HCC) Continue to periodically monitor BMP and avoid nephrotoxic agents    Family/ staff Communication: staff/resident  Labs/tests ordered:  NA

## 2017-06-30 NOTE — Assessment & Plan Note (Signed)
Improved mobility and pain.  Continue prn robaxin and oxycodone until discharge.  Change Tylenol to 1000 mg TID prn per pt request to reduce pill burden. She has made gains in therapy and will likely be ready to return home in the next two weeks.

## 2017-06-30 NOTE — Assessment & Plan Note (Signed)
Continue to periodically monitor BMP and avoid nephrotoxic agents

## 2017-06-30 NOTE — Assessment & Plan Note (Signed)
A1C at goal 6.1 Continue metformin

## 2017-07-01 DIAGNOSIS — M5489 Other dorsalgia: Secondary | ICD-10-CM | POA: Diagnosis not present

## 2017-07-01 DIAGNOSIS — M25551 Pain in right hip: Secondary | ICD-10-CM | POA: Diagnosis not present

## 2017-07-01 DIAGNOSIS — M62562 Muscle wasting and atrophy, not elsewhere classified, left lower leg: Secondary | ICD-10-CM | POA: Diagnosis not present

## 2017-07-01 DIAGNOSIS — R2689 Other abnormalities of gait and mobility: Secondary | ICD-10-CM | POA: Diagnosis not present

## 2017-07-01 DIAGNOSIS — M5387 Other specified dorsopathies, lumbosacral region: Secondary | ICD-10-CM | POA: Diagnosis not present

## 2017-07-01 DIAGNOSIS — M62561 Muscle wasting and atrophy, not elsewhere classified, right lower leg: Secondary | ICD-10-CM | POA: Diagnosis not present

## 2017-07-04 DIAGNOSIS — M5387 Other specified dorsopathies, lumbosacral region: Secondary | ICD-10-CM | POA: Diagnosis not present

## 2017-07-04 DIAGNOSIS — M5489 Other dorsalgia: Secondary | ICD-10-CM | POA: Diagnosis not present

## 2017-07-04 DIAGNOSIS — R2689 Other abnormalities of gait and mobility: Secondary | ICD-10-CM | POA: Diagnosis not present

## 2017-07-04 DIAGNOSIS — M25551 Pain in right hip: Secondary | ICD-10-CM | POA: Diagnosis not present

## 2017-07-04 DIAGNOSIS — M62562 Muscle wasting and atrophy, not elsewhere classified, left lower leg: Secondary | ICD-10-CM | POA: Diagnosis not present

## 2017-07-04 DIAGNOSIS — M62561 Muscle wasting and atrophy, not elsewhere classified, right lower leg: Secondary | ICD-10-CM | POA: Diagnosis not present

## 2017-07-05 DIAGNOSIS — R2689 Other abnormalities of gait and mobility: Secondary | ICD-10-CM | POA: Diagnosis not present

## 2017-07-05 DIAGNOSIS — M5489 Other dorsalgia: Secondary | ICD-10-CM | POA: Diagnosis not present

## 2017-07-05 DIAGNOSIS — M25551 Pain in right hip: Secondary | ICD-10-CM | POA: Diagnosis not present

## 2017-07-05 DIAGNOSIS — M62562 Muscle wasting and atrophy, not elsewhere classified, left lower leg: Secondary | ICD-10-CM | POA: Diagnosis not present

## 2017-07-05 DIAGNOSIS — M5387 Other specified dorsopathies, lumbosacral region: Secondary | ICD-10-CM | POA: Diagnosis not present

## 2017-07-05 DIAGNOSIS — M62561 Muscle wasting and atrophy, not elsewhere classified, right lower leg: Secondary | ICD-10-CM | POA: Diagnosis not present

## 2017-07-06 ENCOUNTER — Encounter: Payer: Medicare Other | Admitting: Internal Medicine

## 2017-07-07 DIAGNOSIS — M5489 Other dorsalgia: Secondary | ICD-10-CM | POA: Diagnosis not present

## 2017-07-07 DIAGNOSIS — M25551 Pain in right hip: Secondary | ICD-10-CM | POA: Diagnosis not present

## 2017-07-07 DIAGNOSIS — R2689 Other abnormalities of gait and mobility: Secondary | ICD-10-CM | POA: Diagnosis not present

## 2017-07-07 DIAGNOSIS — M62561 Muscle wasting and atrophy, not elsewhere classified, right lower leg: Secondary | ICD-10-CM | POA: Diagnosis not present

## 2017-07-07 DIAGNOSIS — M5387 Other specified dorsopathies, lumbosacral region: Secondary | ICD-10-CM | POA: Diagnosis not present

## 2017-07-07 DIAGNOSIS — M62562 Muscle wasting and atrophy, not elsewhere classified, left lower leg: Secondary | ICD-10-CM | POA: Diagnosis not present

## 2017-07-08 DIAGNOSIS — M62561 Muscle wasting and atrophy, not elsewhere classified, right lower leg: Secondary | ICD-10-CM | POA: Diagnosis not present

## 2017-07-08 DIAGNOSIS — M5387 Other specified dorsopathies, lumbosacral region: Secondary | ICD-10-CM | POA: Diagnosis not present

## 2017-07-08 DIAGNOSIS — M25551 Pain in right hip: Secondary | ICD-10-CM | POA: Diagnosis not present

## 2017-07-08 DIAGNOSIS — R2689 Other abnormalities of gait and mobility: Secondary | ICD-10-CM | POA: Diagnosis not present

## 2017-07-08 DIAGNOSIS — M62562 Muscle wasting and atrophy, not elsewhere classified, left lower leg: Secondary | ICD-10-CM | POA: Diagnosis not present

## 2017-07-08 DIAGNOSIS — M5489 Other dorsalgia: Secondary | ICD-10-CM | POA: Diagnosis not present

## 2017-07-10 DIAGNOSIS — R2689 Other abnormalities of gait and mobility: Secondary | ICD-10-CM | POA: Diagnosis not present

## 2017-07-10 DIAGNOSIS — M5387 Other specified dorsopathies, lumbosacral region: Secondary | ICD-10-CM | POA: Diagnosis not present

## 2017-07-10 DIAGNOSIS — M62562 Muscle wasting and atrophy, not elsewhere classified, left lower leg: Secondary | ICD-10-CM | POA: Diagnosis not present

## 2017-07-10 DIAGNOSIS — M25551 Pain in right hip: Secondary | ICD-10-CM | POA: Diagnosis not present

## 2017-07-10 DIAGNOSIS — M5489 Other dorsalgia: Secondary | ICD-10-CM | POA: Diagnosis not present

## 2017-07-10 DIAGNOSIS — M62561 Muscle wasting and atrophy, not elsewhere classified, right lower leg: Secondary | ICD-10-CM | POA: Diagnosis not present

## 2017-07-11 DIAGNOSIS — M5489 Other dorsalgia: Secondary | ICD-10-CM | POA: Diagnosis not present

## 2017-07-11 DIAGNOSIS — Z4789 Encounter for other orthopedic aftercare: Secondary | ICD-10-CM | POA: Diagnosis not present

## 2017-07-11 DIAGNOSIS — M25551 Pain in right hip: Secondary | ICD-10-CM | POA: Diagnosis not present

## 2017-07-11 DIAGNOSIS — S32511S Fracture of superior rim of right pubis, sequela: Secondary | ICD-10-CM | POA: Diagnosis not present

## 2017-07-11 DIAGNOSIS — M5387 Other specified dorsopathies, lumbosacral region: Secondary | ICD-10-CM | POA: Diagnosis not present

## 2017-07-11 DIAGNOSIS — Z9181 History of falling: Secondary | ICD-10-CM | POA: Diagnosis not present

## 2017-07-11 DIAGNOSIS — R102 Pelvic and perineal pain: Secondary | ICD-10-CM | POA: Diagnosis not present

## 2017-07-11 DIAGNOSIS — R2689 Other abnormalities of gait and mobility: Secondary | ICD-10-CM | POA: Diagnosis not present

## 2017-07-11 DIAGNOSIS — R278 Other lack of coordination: Secondary | ICD-10-CM | POA: Diagnosis not present

## 2017-07-11 DIAGNOSIS — S3289XS Fracture of other parts of pelvis, sequela: Secondary | ICD-10-CM | POA: Diagnosis not present

## 2017-07-11 DIAGNOSIS — M84454S Pathological fracture, pelvis, sequela: Secondary | ICD-10-CM | POA: Diagnosis not present

## 2017-07-11 DIAGNOSIS — M62562 Muscle wasting and atrophy, not elsewhere classified, left lower leg: Secondary | ICD-10-CM | POA: Diagnosis not present

## 2017-07-11 DIAGNOSIS — M810 Age-related osteoporosis without current pathological fracture: Secondary | ICD-10-CM | POA: Diagnosis not present

## 2017-07-11 DIAGNOSIS — W1831XS Fall on same level due to stepping on an object, sequela: Secondary | ICD-10-CM | POA: Diagnosis not present

## 2017-07-11 DIAGNOSIS — M62561 Muscle wasting and atrophy, not elsewhere classified, right lower leg: Secondary | ICD-10-CM | POA: Diagnosis not present

## 2017-07-12 ENCOUNTER — Non-Acute Institutional Stay (SKILLED_NURSING_FACILITY): Payer: Medicare Other | Admitting: Internal Medicine

## 2017-07-12 ENCOUNTER — Encounter: Payer: Self-pay | Admitting: Internal Medicine

## 2017-07-12 DIAGNOSIS — J3089 Other allergic rhinitis: Secondary | ICD-10-CM

## 2017-07-12 DIAGNOSIS — R05 Cough: Secondary | ICD-10-CM | POA: Diagnosis not present

## 2017-07-12 DIAGNOSIS — M62562 Muscle wasting and atrophy, not elsewhere classified, left lower leg: Secondary | ICD-10-CM | POA: Diagnosis not present

## 2017-07-12 DIAGNOSIS — R053 Chronic cough: Secondary | ICD-10-CM

## 2017-07-12 DIAGNOSIS — R2689 Other abnormalities of gait and mobility: Secondary | ICD-10-CM | POA: Diagnosis not present

## 2017-07-12 DIAGNOSIS — M62561 Muscle wasting and atrophy, not elsewhere classified, right lower leg: Secondary | ICD-10-CM | POA: Diagnosis not present

## 2017-07-12 DIAGNOSIS — M5387 Other specified dorsopathies, lumbosacral region: Secondary | ICD-10-CM | POA: Diagnosis not present

## 2017-07-12 DIAGNOSIS — M5489 Other dorsalgia: Secondary | ICD-10-CM | POA: Diagnosis not present

## 2017-07-12 DIAGNOSIS — S3282XD Multiple fractures of pelvis without disruption of pelvic ring, subsequent encounter for fracture with routine healing: Secondary | ICD-10-CM

## 2017-07-12 DIAGNOSIS — M25551 Pain in right hip: Secondary | ICD-10-CM | POA: Diagnosis not present

## 2017-07-12 NOTE — Progress Notes (Signed)
Patient ID: Kelsey Salinas, female   DOB: [birth year], 84 y.o.   MRN: [id number]  Location:  [facility] Room Number: [place] of Service:  SNF [phone]) Provider:   Gayland Curry, DO  Patient Care Team: Kelsey Curry, DO as PCP - General (Geriatric Medicine) Kelsey Crome, MD as PCP - Cardiology (Cardiology) Kelsey Arabian, MD as Consulting Physician (Orthopedic Surgery) Kelsey Crome, MD as Consulting Physician (Cardiology) Kelsey Fam, MD as Consulting Physician (Ophthalmology) Kelsey Saas, MD as Consulting Physician (Orthopedic Surgery)  Extended Emergency Contact Information Primary Emergency Contact: Kelsey Salinas of [place] Phone: [phone] Relation: Daughter Secondary Emergency Contact: Kelsey, Salinas Mobile Phone: [phone] Relation: Niece  Code Status:  Full code Goals of care: Advanced Directive information Advanced Directives 07/12/2017  Does Patient Have a Medical Advance Directive? Yes  Type of Paramedic of [place];Living will  Does patient want to make changes to medical advance directive? No - Patient declined  Copy of [place] in Chart? Yes  Would patient like information on creating a medical advance directive? -  Pre-existing out of facility DNR order (yellow form or pink MOST form) -   Chief Complaint  Patient presents with  . Acute Visit    cough    HPI:  Pt is a 84 y.o. female with h/o summer allergies, prior difficulty with cough last year, chronic diastolic chf, childhood asthma here for rehab s/p pelvic fracture seen today for an acute visit for intermittent cough that is dry and persists once it starts.  Kelsey Salinas notes Kelsey Salinas gets it "every year" in the spring or summer.  Kelsey Salinas reports it got better with prednisone in the past (?asthmatic component). Discussed risks of prednisone given her current reason for admission and need to heal her  fractures, osteoporosis diagnosis and her hyperglycemia.  Kelsey Salinas agrees to hold off on prednisone if other treatments may work.    Past Medical History:  Diagnosis Date  . Childhood asthma   . Chronic diastolic heart failure [facility]) 12/25/2012   October 2013 echocardiogram 1. There is moderate concentric left ventricle hypertrophy. 2. Left ventricular ejection fraction estimated by 2D at 60-65 percent. 3. There were no regional wall motion abnormalities. 4. Mild mitral annular calcification. 5. Mild mitral valve regurgitation. 6. There is mild tricuspid regurgitation. 7. Mildly elevated estimated right ventricular systolic pressure. 8. Right ventricular systolic pressure estimated at 35-40 mm Hg. 9. Mild calcification of the aortic valve. 10. Trace aortic valve regurgitation. 11. Analysis of mitral valve inflow, pulmonary vein Doppler and tissue Doppler suggests grade I diastolic dysfunction without elevated left atrial pressure.   . Colon polyp   . Diverticular stricture ([place]) 2009  . Diverticulosis 10-30-2007  . Edema 09/12/13  . Fall 05/18/2017   tripped over wheel of walker; superior and inferior pubic ramus fractures   . Hypercholesterolemia   . Hypertension   . Hypertension, essential   . Hypothyroidism    as a teenager  . Indigestion   . Insomnia   . OA (osteoarthritis) of hip    right hip replacement, OA in bilateral knees  . Osteopenia    Actonel stopped in 2012 after 5years therapy  . Pneumonia 1939   "hospitalized"  . Rapid resting heart rate 27/25/3664   Uncertain cause. Possible ectopic atrial tachycardia. Date entered 12/25/12   . Right rotator cuff tear   . Seasonal allergies    Past Surgical History:  Procedure Laterality Date  .  CATARACT EXTRACTION W/ INTRAOCULAR LENS  IMPLANT, BILATERAL Bilateral   . COLONOSCOPY  2009   Dr. [facility]  . DILATION AND CURETTAGE OF UTERUS    . FRACTURE SURGERY    . JOINT REPLACEMENT    . ORIF WRIST FRACTURE Right   . REDUCTION  MAMMAPLASTY  1999  . TOTAL HIP ARTHROPLASTY Right 03-04-08   Dr. Wynelle Link    Allergies  Allergen Reactions  . Amlodipine Besylate Other (See Comments)    Edema  . Lisinopril Cough    Cough   . Sulfamethoxazole-Trimethoprim Other (See Comments)    Unknown reaction     Outpatient Encounter Medications as of 07/12/2017  Medication Sig  . acetaminophen (TYLENOL) 500 MG tablet Take 1,000 mg by mouth 2 (two) times daily.  Marland Kitchen alendronate (FOSAMAX) 70 MG tablet Take 70 mg by mouth once a week. Take with a full glass of water on an empty stomach.  Marland Kitchen aspirin EC 81 MG tablet Take 81 mg by mouth daily.  Marland Kitchen atorvastatin (LIPITOR) 10 MG tablet Take 10 mg by mouth daily. For cholesterol  . Cholecalciferol (VITAMIN D PO) Take 1 tablet by mouth every 7 (seven) days. 50,000 units  . diclofenac sodium (VOLTAREN) 1 % GEL Apply 2 g topically every 6 (six) hours as needed.  . famotidine (PEPCID) 20 MG tablet Take 20 mg by mouth daily.  Marland Kitchen losartan-hydrochlorothiazide (HYZAAR) 100-25 MG tablet Take 1 tablet by mouth daily.  . metFORMIN (GLUCOPHAGE) 500 MG tablet TAKE (1/2) TABLET DAILY.  . methocarbamol (ROBAXIN) 500 MG tablet Take 500 mg by mouth 2 (two) times daily as needed for muscle spasms.  . metoprolol succinate (TOPROL-XL) 100 MG 24 hr tablet Take 150 mg by mouth 2 (two) times daily. Take with or immediately following a meal.  . oxyCODONE (OXY IR/ROXICODONE) 5 MG immediate release tablet Take 5 mg by mouth 2 (two) times daily as needed for severe pain.  . polyethylene glycol (MIRALAX / GLYCOLAX) packet Take 17 g by mouth daily as needed for mild constipation.   No facility-administered encounter medications on file as of 07/12/2017.     Review of Systems  Constitutional: Negative for activity change, appetite change and unexpected weight change.  HENT: Positive for postnasal drip. Negative for congestion.   Eyes:       Glasses  Respiratory: Positive for cough. Negative for chest tightness, shortness  of breath and wheezing.   Cardiovascular: Negative for chest pain, palpitations and leg swelling.  Gastrointestinal: Negative for constipation.  Genitourinary: Negative for dysuria.  Musculoskeletal: Positive for gait problem.       Walking with walker independently now  Neurological: Negative for dizziness and weakness.  Hematological: Bruises/bleeds easily.  Psychiatric/Behavioral: Negative for confusion.       Anxiety better lately    Immunization History  Administered Date(s) Administered  . Influenza-Unspecified 10/25/2013, 10/31/2014, 11/06/2015, 11/01/2016  . Pneumococcal Conjugate-13 06/04/2014   Pertinent  Health Maintenance Due  Topic Date Due  . FOOT EXAM  09/02/1936  . OPHTHALMOLOGY EXAM  09/02/1936  . PNA vac Low Risk Adult (2 of 2 - PPSV23) 06/04/2015  . INFLUENZA VACCINE  08/11/2017  . HEMOGLOBIN A1C  12/16/2017  . DEXA SCAN  Completed   Fall Risk  12/01/2016 11/03/2016 06/16/2016 04/07/2016 10/01/2015  Falls in the past year? No No No No No   Functional Status Survey:    Vitals:   07/12/17 1007  BP: 136/86  Pulse: (!) 101  Resp: 20  Temp: 98 F (36.7 C)  TempSrc: Oral  SpO2: 96%  Weight: 173 lb (78.5 kg)  Height: 5\' 7"  (1.702 m)   Body mass index is 27.1 kg/m. Physical Exam  Constitutional: Kelsey Salinas is oriented to person, place, and time. Kelsey Salinas appears well-developed and well-nourished. No distress.  HENT:  Wiping nose with tissue  Cardiovascular: Normal rate, regular rhythm, normal heart sounds and intact distal pulses.  Pulmonary/Chest: Effort normal. Kelsey Salinas has no wheezes.  Abdominal: Bowel sounds are normal.  Musculoskeletal:  Ambulates with rolling walker  Neurological: Kelsey Salinas is alert and oriented to person, place, and time.  Skin: Skin is warm and dry. Capillary refill takes less than 2 seconds.  Psychiatric: Kelsey Salinas has a normal mood and affect.    Labs reviewed: Recent Labs    05/18/17 1900 05/18/17 1918 05/19/17 0434 06/02/17 1111  NA 140 141  140 137  K 3.3* 3.3* 4.2 4.0  CL 103 103 106  --   CO2 26  --  27  --   GLUCOSE 108* 103* 132*  --   BUN 20 23* 22* 30*  CREATININE 1.05* 1.00 1.07* 0.9  CALCIUM 8.9  --  8.8*  --   MG 1.8  --   --   --    Recent Labs    05/19/17 0434  ALBUMIN 3.4*   Recent Labs    05/18/17 1900  05/19/17 0434 05/20/17 0450 05/30/17 06/06/17 0300  WBC 13.9*  --  9.0 10.0 9.0 10.1  NEUTROABS 10.9*  --   --   --   --   --   HGB 13.5   < > 12.3 11.2* 11.6* 11.8*  HCT 42.6   < > 39.4 35.9* 35* 36  MCV 95.7  --  95.9 96.2  --   --   PLT 169  --  167 130* 283 315   < > = values in this interval not displayed.   Lab Results  Component Value Date   TSH 0.61 03/25/2015   Lab Results  Component Value Date   HGBA1C 6.1 06/16/2017   Lab Results  Component Value Date   CHOL 156 06/02/2017   HDL 52 06/02/2017   LDLCALC 79 06/02/2017   TRIG 127 06/02/2017   Assessment/Plan 1. Persistent dry cough -recurs each year for her -will treat with zyrtec, flonase and bid prn tessalon perles -if not better by next week, would do prednisone taper (tyring to avoid with her glucose and her pelvic fxs)  2. Environmental and seasonal allergies -cause of #1, it appears, lungs are clear  3. Multiple closed fractures of pelvis without disruption of pelvic ring with routine healing, subsequent encounter -recovering well, walking with rolling walker and not complaining of pain -expects about 2 more weeks of PT here in rehab -off anticoagulation (DVT prophylaxis) now as getting up frequently and walking around on her own  Family/ staff Communication: discussed with rehab nurse  Labs/tests ordered:  No new  Dejon Jungman L. Alden Feagan, D.O. [facility] [address], [place] [address] Cell Phone (Mon-Fri 8am-5pm):  [phone] On Call:  [phone] & follow prompts after 5pm & weekends Office Phone:  [phone] Office Fax:  [phone]

## 2017-07-13 ENCOUNTER — Telehealth: Payer: Self-pay | Admitting: Internal Medicine

## 2017-07-13 DIAGNOSIS — M5387 Other specified dorsopathies, lumbosacral region: Secondary | ICD-10-CM | POA: Diagnosis not present

## 2017-07-13 DIAGNOSIS — M25551 Pain in right hip: Secondary | ICD-10-CM | POA: Diagnosis not present

## 2017-07-13 DIAGNOSIS — M62562 Muscle wasting and atrophy, not elsewhere classified, left lower leg: Secondary | ICD-10-CM | POA: Diagnosis not present

## 2017-07-13 DIAGNOSIS — R2689 Other abnormalities of gait and mobility: Secondary | ICD-10-CM | POA: Diagnosis not present

## 2017-07-13 DIAGNOSIS — M5489 Other dorsalgia: Secondary | ICD-10-CM | POA: Diagnosis not present

## 2017-07-13 DIAGNOSIS — M62561 Muscle wasting and atrophy, not elsewhere classified, right lower leg: Secondary | ICD-10-CM | POA: Diagnosis not present

## 2017-07-13 NOTE — Telephone Encounter (Signed)
I left a message asking the patient to call me at [phone] to schedule AWV with Clarise Cruz at [facility] on 07/19/17 if available. VDM (DD)

## 2017-07-14 DIAGNOSIS — M5489 Other dorsalgia: Secondary | ICD-10-CM | POA: Diagnosis not present

## 2017-07-14 DIAGNOSIS — M62562 Muscle wasting and atrophy, not elsewhere classified, left lower leg: Secondary | ICD-10-CM | POA: Diagnosis not present

## 2017-07-14 DIAGNOSIS — M5387 Other specified dorsopathies, lumbosacral region: Secondary | ICD-10-CM | POA: Diagnosis not present

## 2017-07-14 DIAGNOSIS — R2689 Other abnormalities of gait and mobility: Secondary | ICD-10-CM | POA: Diagnosis not present

## 2017-07-14 DIAGNOSIS — M25551 Pain in right hip: Secondary | ICD-10-CM | POA: Diagnosis not present

## 2017-07-14 DIAGNOSIS — M62561 Muscle wasting and atrophy, not elsewhere classified, right lower leg: Secondary | ICD-10-CM | POA: Diagnosis not present

## 2017-07-15 DIAGNOSIS — M5387 Other specified dorsopathies, lumbosacral region: Secondary | ICD-10-CM | POA: Diagnosis not present

## 2017-07-15 DIAGNOSIS — M25551 Pain in right hip: Secondary | ICD-10-CM | POA: Diagnosis not present

## 2017-07-15 DIAGNOSIS — M5489 Other dorsalgia: Secondary | ICD-10-CM | POA: Diagnosis not present

## 2017-07-15 DIAGNOSIS — R2689 Other abnormalities of gait and mobility: Secondary | ICD-10-CM | POA: Diagnosis not present

## 2017-07-15 DIAGNOSIS — M62561 Muscle wasting and atrophy, not elsewhere classified, right lower leg: Secondary | ICD-10-CM | POA: Diagnosis not present

## 2017-07-15 DIAGNOSIS — M62562 Muscle wasting and atrophy, not elsewhere classified, left lower leg: Secondary | ICD-10-CM | POA: Diagnosis not present

## 2017-07-18 DIAGNOSIS — M5387 Other specified dorsopathies, lumbosacral region: Secondary | ICD-10-CM | POA: Diagnosis not present

## 2017-07-18 DIAGNOSIS — R2689 Other abnormalities of gait and mobility: Secondary | ICD-10-CM | POA: Diagnosis not present

## 2017-07-18 DIAGNOSIS — M62562 Muscle wasting and atrophy, not elsewhere classified, left lower leg: Secondary | ICD-10-CM | POA: Diagnosis not present

## 2017-07-18 DIAGNOSIS — M5489 Other dorsalgia: Secondary | ICD-10-CM | POA: Diagnosis not present

## 2017-07-18 DIAGNOSIS — M62561 Muscle wasting and atrophy, not elsewhere classified, right lower leg: Secondary | ICD-10-CM | POA: Diagnosis not present

## 2017-07-18 DIAGNOSIS — M25551 Pain in right hip: Secondary | ICD-10-CM | POA: Diagnosis not present

## 2017-07-19 ENCOUNTER — Non-Acute Institutional Stay (SKILLED_NURSING_FACILITY): Payer: Medicare Other

## 2017-07-19 DIAGNOSIS — Z0000 Encounter for general adult medical examination without abnormal findings: Secondary | ICD-10-CM | POA: Diagnosis not present

## 2017-07-19 DIAGNOSIS — M5387 Other specified dorsopathies, lumbosacral region: Secondary | ICD-10-CM | POA: Diagnosis not present

## 2017-07-19 DIAGNOSIS — M62562 Muscle wasting and atrophy, not elsewhere classified, left lower leg: Secondary | ICD-10-CM | POA: Diagnosis not present

## 2017-07-19 DIAGNOSIS — M5489 Other dorsalgia: Secondary | ICD-10-CM | POA: Diagnosis not present

## 2017-07-19 DIAGNOSIS — R2689 Other abnormalities of gait and mobility: Secondary | ICD-10-CM | POA: Diagnosis not present

## 2017-07-19 DIAGNOSIS — M62561 Muscle wasting and atrophy, not elsewhere classified, right lower leg: Secondary | ICD-10-CM | POA: Diagnosis not present

## 2017-07-19 DIAGNOSIS — M25551 Pain in right hip: Secondary | ICD-10-CM | POA: Diagnosis not present

## 2017-07-19 DIAGNOSIS — Z23 Encounter for immunization: Secondary | ICD-10-CM | POA: Diagnosis not present

## 2017-07-19 MED ORDER — ZOSTER VAC RECOMB ADJUVANTED 50 MCG/0.5ML IM SUSR: 0.5000 mL | Freq: Once | INTRAMUSCULAR | 1 refills | Status: AC

## 2017-07-19 NOTE — Patient Instructions (Addendum)
[name] , Thank you for taking time to come for your Medicare Wellness Visit. I appreciate your ongoing commitment to your health goals. Please review the following plan we discussed and let me know if I can assist you in the future.   Screening recommendations/referrals: Colonoscopy excluded, over age 82 Mammogram excluded, over age 35 Bone Density up to date Recommended yearly ophthalmology/optometry visit for glaucoma screening and checkup Recommended yearly dental visit for hygiene and checkup  Vaccinations: Influenza vaccine up to date, due 2019 fall season Pneumococcal vaccine 23 due, ordered Tdap vaccine due, ordered Shingles vaccine due   Advanced directives: in chart  Conditions/risks identified: none  Next appointment: Dr. Mariea Clonts makes rounds   Preventive Care 29 Years and Older, Female Preventive care refers to lifestyle choices and visits with your health care provider that can promote health and wellness. What does preventive care include?  A yearly physical exam. This is also called an annual well check.  Dental exams once or twice a year.  Routine eye exams. Ask your health care provider how often you should have your eyes checked.  Personal lifestyle choices, including:  Daily care of your teeth and gums.  Regular physical activity.  Eating a healthy diet.  Avoiding tobacco and drug use.  Limiting alcohol use.  Practicing safe sex.  Taking low-dose aspirin every day.  Taking vitamin and mineral supplements as recommended by your health care provider. What happens during an annual well check? The services and screenings done by your health care provider during your annual well check will depend on your age, overall health, lifestyle risk factors, and family history of disease. Counseling  Your health care provider may ask you questions about your:  Alcohol use.  Tobacco use.  Drug use.  Emotional well-being.  Home and relationship  well-being.  Sexual activity.  Eating habits.  History of falls.  Memory and ability to understand (cognition).  Work and work Statistician.  Reproductive health. Screening  You may have the following tests or measurements:  Height, weight, and BMI.  Blood pressure.  Lipid and cholesterol levels. These may be checked every 5 years, or more frequently if you are over 73 years old.  Skin check.  Lung cancer screening. You may have this screening every year starting at age 84 if you have a 30-pack-year history of smoking and currently smoke or have quit within the past 15 years.  Fecal occult blood test (FOBT) of the stool. You may have this test every year starting at age 76.  Flexible sigmoidoscopy or colonoscopy. You may have a sigmoidoscopy every 5 years or a colonoscopy every 10 years starting at age 60.  Hepatitis C blood test.  Hepatitis B blood test.  Sexually transmitted disease (STD) testing.  Diabetes screening. This is done by checking your blood sugar (glucose) after you have not eaten for a while (fasting). You may have this done every 1-3 years.  Bone density scan. This is done to screen for osteoporosis. You may have this done starting at age 50.  Mammogram. This may be done every 1-2 years. Talk to your health care provider about how often you should have regular mammograms. Talk with your health care provider about your test results, treatment options, and if necessary, the need for more tests. Vaccines  Your health care provider may recommend certain vaccines, such as:  Influenza vaccine. This is recommended every year.  Tetanus, diphtheria, and acellular pertussis (Tdap, Td) vaccine. You may need a Td booster  every 10 years.  Zoster vaccine. You may need this after age 22.  Pneumococcal 13-valent conjugate (PCV13) vaccine. One dose is recommended after age 14.  Pneumococcal polysaccharide (PPSV23) vaccine. One dose is recommended after age  75. Talk to your health care provider about which screenings and vaccines you need and how often you need them. This information is not intended to replace advice given to you by your health care provider. Make sure you discuss any questions you have with your health care provider. Document Released: 01/24/2015 Document Revised: 09/17/2015 Document Reviewed: 10/29/2014 Elsevier Interactive Patient Education  2017 [place] Prevention in the Home Falls can cause injuries. They can happen to people of all ages. There are many things you can do to make your home safe and to help prevent falls. What can I do on the outside of my home?  Regularly fix the edges of walkways and driveways and fix any cracks.  Remove anything that might make you trip as you walk through a door, such as a raised step or threshold.  Trim any bushes or trees on the path to your home.  Use bright outdoor lighting.  Clear any walking paths of anything that might make someone trip, such as rocks or tools.  Regularly check to see if handrails are loose or broken. Make sure that both sides of any steps have handrails.  Any raised decks and porches should have guardrails on the edges.  Have any leaves, snow, or ice cleared regularly.  Use sand or salt on walking paths during winter.  Clean up any spills in your garage right away. This includes oil or grease spills. What can I do in the bathroom?  Use night lights.  Install grab bars by the toilet and in the tub and shower. Do not use towel bars as grab bars.  Use non-skid mats or decals in the tub or shower.  If you need to sit down in the shower, use a plastic, non-slip stool.  Keep the floor dry. Clean up any water that spills on the floor as soon as it happens.  Remove soap buildup in the tub or shower regularly.  Attach bath mats securely with double-sided non-slip rug tape.  Do not have throw rugs and other things on the floor that can make  you trip. What can I do in the bedroom?  Use night lights.  Make sure that you have a light by your bed that is easy to reach.  Do not use any sheets or blankets that are too big for your bed. They should not hang down onto the floor.  Have a firm chair that has side arms. You can use this for support while you get dressed.  Do not have throw rugs and other things on the floor that can make you trip. What can I do in the kitchen?  Clean up any spills right away.  Avoid walking on wet floors.  Keep items that you use a lot in easy-to-reach places.  If you need to reach something above you, use a strong step stool that has a grab bar.  Keep electrical cords out of the way.  Do not use floor polish or wax that makes floors slippery. If you must use wax, use non-skid floor wax.  Do not have throw rugs and other things on the floor that can make you trip. What can I do with my stairs?  Do not leave any items on the stairs.  Make  sure that there are handrails on both sides of the stairs and use them. Fix handrails that are broken or loose. Make sure that handrails are as long as the stairways.  Check any carpeting to make sure that it is firmly attached to the stairs. Fix any carpet that is loose or worn.  Avoid having throw rugs at the top or bottom of the stairs. If you do have throw rugs, attach them to the floor with carpet tape.  Make sure that you have a light switch at the top of the stairs and the bottom of the stairs. If you do not have them, ask someone to add them for you. What else can I do to help prevent falls?  Wear shoes that:  Do not have high heels.  Have rubber bottoms.  Are comfortable and fit you well.  Are closed at the toe. Do not wear sandals.  If you use a stepladder:  Make sure that it is fully opened. Do not climb a closed stepladder.  Make sure that both sides of the stepladder are locked into place.  Ask someone to hold it for you, if  possible.  Clearly mark and make sure that you can see:  Any grab bars or handrails.  First and last steps.  Where the edge of each step is.  Use tools that help you move around (mobility aids) if they are needed. These include:  Canes.  Walkers.  Scooters.  Crutches.  Turn on the lights when you go into a dark area. Replace any light bulbs as soon as they burn out.  Set up your furniture so you have a clear path. Avoid moving your furniture around.  If any of your floors are uneven, fix them.  If there are any pets around you, be aware of where they are.  Review your medicines with your doctor. Some medicines can make you feel dizzy. This can increase your chance of falling. Ask your doctor what other things that you can do to help prevent falls. This information is not intended to replace advice given to you by your health care provider. Make sure you discuss any questions you have with your health care provider. Document Released: 10/24/2008 Document Revised: 06/05/2015 Document Reviewed: 02/01/2014 Elsevier Interactive Patient Education  2017 [employer].

## 2017-07-19 NOTE — Progress Notes (Addendum)
Subjective:   Kelsey Salinas is a 84 y.o. female who presents for Medicare Annual (Subsequent) preventive examination at [facility] rehab  Last AWV-04/07/2016    Objective:     Vitals: BP 135/85 (BP Location: Right Arm, Patient Position: Sitting)   Pulse 95   Temp 98.2 F (36.8 C) (Oral)   Ht 5\' 7"  (1.702 m)   Wt 175 lb (79.4 kg)   BMI 27.41 kg/m   Body mass index is 27.41 kg/m.  Advanced Directives 07/19/2017 07/12/2017 06/21/2017 05/24/2017 05/19/2017 05/18/2017 03/09/2017  Does Patient Have a Medical Advance Directive? Yes Yes Yes Yes Yes Yes Yes  Type of Paramedic of [place];Living will [place];Living will [place];Living will [place];Living will [place];Living will - Will;Living will  Does patient want to make changes to medical advance directive? No - Patient declined No - Patient declined No - Patient declined No - Patient declined No - Patient declined No - Patient declined No - Patient declined  Copy of [place] in Chart? Yes Yes Yes Yes Yes - Yes  Would patient like information on creating a medical advance directive? - - - - - - No - Patient declined  Pre-existing out of facility DNR order (yellow form or pink MOST form) - - - - - - -    Tobacco Social History   Tobacco Use  Smoking Status Former Smoker  . Packs/day: 0.12  . Years: 50.00  . Pack years: 6.00  . Types: Cigarettes  Smokeless Tobacco Never Used  Tobacco Comment   "stopped in the ate 1990s"     Counseling given: Not Answered Comment: "stopped in the ate 1990s"   Clinical Intake:  Pre-visit preparation completed: No  Pain : No/denies pain     Nutritional Risks: None Diabetes: No  How often do you need to have someone help you when you read instructions, pamphlets, or other written materials from your doctor or pharmacy?: 1 - Never What is the  last grade level you completed in school?: College  Interpreter Needed?: No  Information entered by :: Tyson Dense, RN  Past Medical History:  Diagnosis Date  . Childhood asthma   . Chronic diastolic heart failure [facility]) 12/25/2012   October 2013 echocardiogram 1. There is moderate concentric left ventricle hypertrophy. 2. Left ventricular ejection fraction estimated by 2D at 60-65 percent. 3. There were no regional wall motion abnormalities. 4. Mild mitral annular calcification. 5. Mild mitral valve regurgitation. 6. There is mild tricuspid regurgitation. 7. Mildly elevated estimated right ventricular systolic pressure. 8. Right ventricular systolic pressure estimated at 35-40 mm Hg. 9. Mild calcification of the aortic valve. 10. Trace aortic valve regurgitation. 11. Analysis of mitral valve inflow, pulmonary vein Doppler and tissue Doppler suggests grade I diastolic dysfunction without elevated left atrial pressure.   . Colon polyp   . Diverticular stricture ([place]) 2009  . Diverticulosis 10-30-2007  . Edema 09/12/13  . Fall 05/18/2017   tripped over wheel of walker; superior and inferior pubic ramus fractures   . Hypercholesterolemia   . Hypertension   . Hypertension, essential   . Hypothyroidism    as a teenager  . Indigestion   . Insomnia   . OA (osteoarthritis) of hip    right hip replacement, OA in bilateral knees  . Osteopenia    Actonel stopped in 2012 after 5years therapy  . Pneumonia 1939   "hospitalized"  .  Rapid resting heart rate 70/62/3762   Uncertain cause. Possible ectopic atrial tachycardia. Date entered 12/25/12   . Right rotator cuff tear   . Seasonal allergies    Past Surgical History:  Procedure Laterality Date  . CATARACT EXTRACTION W/ INTRAOCULAR LENS  IMPLANT, BILATERAL Bilateral   . COLONOSCOPY  2009   Dr. [facility] [employer]  . DILATION AND CURETTAGE OF UTERUS    . FRACTURE SURGERY    . JOINT REPLACEMENT    . ORIF WRIST FRACTURE Right   . REDUCTION MAMMAPLASTY   1999  . TOTAL HIP ARTHROPLASTY Right 03-04-08   Dr. Wynelle Link   Family History  Problem Relation Age of Onset  . Heart disease Mother   . Heart disease Father   . Heart disease Brother    Social History   Socioeconomic History  . Marital status: Married    Spouse name: Not on file  . Number of children: Not on file  . Years of education: Not on file  . Highest education level: Not on file  Occupational History  . Occupation: retired travel Hydrologist Needs  . Financial resource strain: Not hard at all  . Food insecurity:    Worry: Never true    Inability: Never true  . Transportation needs:    Medical: No    Non-medical: No  Tobacco Use  . Smoking status: Former Smoker    Packs/day: 0.12    Years: 50.00    Pack years: 6.00    Types: Cigarettes  . Smokeless tobacco: Never Used  . Tobacco comment: "stopped in the ate 1990s"  Substance and Sexual Activity  . Alcohol use: Yes    Comment: rarely  . Drug use: Never  . Sexual activity: Not Currently  Lifestyle  . Physical activity:    Days per week: 7 days    Minutes per session: 20 min  . Stress: To some extent  Relationships  . Social connections:    Talks on phone: More than three times a week    Gets together: More than three times a week    Attends religious service: More than 4 times per year    Active member of club or organization: Yes    Attends meetings of clubs or organizations: Never    Relationship status: Married  Other Topics Concern  . Not on file  Social History Narrative   Lives at [place] since 2010   Husband Ilona Sorrel married 1952 (husband in memory care unit at [employer])   Former smoker 1/2 PPD, for 30 years, stopped 01/10/1989   Alcohol 2 glasses of wine at night   Exercise: machines 3 times a week and daily walk   No POA/LW          Outpatient Encounter Medications as of 07/19/2017  Medication Sig  . acetaminophen (TYLENOL) 500 MG tablet Take 1,000 mg by mouth 2 (two) times daily.    Marland Kitchen alendronate (FOSAMAX) 70 MG tablet Take 70 mg by mouth once a week. Take with a full glass of water on an empty stomach.  Marland Kitchen aspirin EC 81 MG tablet Take 81 mg by mouth daily.  Marland Kitchen atorvastatin (LIPITOR) 10 MG tablet Take 10 mg by mouth daily. For cholesterol  . Cholecalciferol (VITAMIN D PO) Take 1 tablet by mouth every 7 (seven) days. 50,000 units  . diclofenac sodium (VOLTAREN) 1 % GEL Apply 2 g topically every 6 (six) hours as needed.  . famotidine (PEPCID) 20 MG tablet Take 20 mg by mouth  daily.  . losartan-hydrochlorothiazide (HYZAAR) 100-25 MG tablet Take 1 tablet by mouth daily.  . metFORMIN (GLUCOPHAGE) 500 MG tablet TAKE (1/2) TABLET DAILY.  . methocarbamol (ROBAXIN) 500 MG tablet Take 500 mg by mouth 2 (two) times daily as needed for muscle spasms.  . metoprolol succinate (TOPROL-XL) 100 MG 24 hr tablet Take 150 mg by mouth 2 (two) times daily. Take with or immediately following a meal.  . oxyCODONE (OXY IR/ROXICODONE) 5 MG immediate release tablet Take 5 mg by mouth 2 (two) times daily as needed for severe pain.  . polyethylene glycol (MIRALAX / GLYCOLAX) packet Take 17 g by mouth daily as needed for mild constipation.   No facility-administered encounter medications on file as of 07/19/2017.     Activities of Daily Living In your present state of health, do you have any difficulty performing the following activities: 07/19/2017 05/19/2017  Hearing? N N  Vision? N Y  Comment - "sometimes"  Difficulty concentrating or making decisions? Y Y  Comment - "sometimes"  Walking or climbing stairs? N Y  Dressing or bathing? N N  Doing errands, shopping? N N  Preparing Food and eating ? N -  Using the Toilet? N -  In the past six months, have you accidently leaked urine? N -  Do you have problems with loss of bowel control? N -  Managing your Medications? N -  Managing your Finances? N -  Housekeeping or managing your Housekeeping? N -  Some recent data might be hidden    Patient  Care Team: Gayland Curry, DO as PCP - General (Geriatric Medicine) Belva Crome, MD as PCP - Cardiology (Cardiology) Gaynelle Arabian, MD as Consulting Physician (Orthopedic Surgery) Belva Crome, MD as Consulting Physician (Cardiology) Monna Fam, MD as Consulting Physician (Ophthalmology) Elsie Saas, MD as Consulting Physician (Orthopedic Surgery)    Assessment:   This is a routine wellness examination for [place].  Exercise Activities and Dietary recommendations Current Exercise Habits: Structured exercise class, Type of exercise: Other - see comments(physical therapy), Time (Minutes): 20, Frequency (Times/Week): 7, Weekly Exercise (Minutes/Week): 140, Intensity: Mild, Exercise limited by: orthopedic condition(s)  Goals    None      Fall Risk Fall Risk  07/19/2017 12/01/2016 11/03/2016 06/16/2016 04/07/2016  Falls in the past year? Yes No No No No  Number falls in past yr: 1 - - - -  Injury with Fall? Yes - - - -   Is the patient's home free of loose throw rugs in walkways, pet beds, electrical cords, etc?   yes      Grab bars in the bathroom? yes      Handrails on the stairs?   yes      Adequate lighting?   yes  Timed Get Up and Go performed: unable to complete with broken leg  Depression Screen PHQ 2/9 Scores 07/19/2017 07/19/2017 12/01/2016 11/03/2016  PHQ - 2 Score 1 1 0 0     Cognitive Function MMSE - Mini Mental State Exam 07/19/2017 04/07/2016 04/02/2015 12/10/2013  Orientation to time 4 5 5 5   Orientation to Place 5 5 5 5   Registration 3 3 3 3   Attention/ Calculation 5 5 5 5   Recall 1 3 3 3   Language- name 2 objects 2 2 2 2   Language- repeat 1 1 1 1   Language- follow 3 step command 3 3 3 3   Language- read & follow direction 1 1 1 1   Write a sentence 1 1 1  1  Copy design 1 1 1 1   Total score 27 30 30 30         Immunization History  Administered Date(s) Administered  . Influenza-Unspecified 10/25/2013, 10/31/2014, 11/06/2015, 11/01/2016  . Pneumococcal  Conjugate-13 06/04/2014    Qualifies for Shingles Vaccine? Yes, educated and wants to check with daughter  Screening Tests Health Maintenance  Topic Date Due  . FOOT EXAM  09/02/1936  . OPHTHALMOLOGY EXAM  09/02/1936  . TETANUS/TDAP  09/02/1947  . PNA vac Low Risk Adult (2 of 2 - PPSV23) 06/04/2015  . INFLUENZA VACCINE  08/11/2017  . HEMOGLOBIN A1C  12/16/2017  . DEXA SCAN  Completed    Cancer Screenings: Lung: Low Dose CT Chest recommended if Age 31-80 years, 30 pack-year currently smoking OR have quit w/in 15years. Patient does not qualify. Breast:  Up to date on Mammogram? Yes   Up to date of Bone Density/Dexa? Yes Colorectal: up to date  Additional Screenings:  Hepatitis C Screening: declined TDAP due: ordered  Pneumovax due: ordered     Plan:    I have personally reviewed and addressed the Medicare Annual Wellness questionnaire and have noted the following in the patient's chart:  A. Medical and social history B. Use of alcohol, tobacco or illicit drugs  C. Current medications and supplements D. Functional ability and status E.  Nutritional status F.  Physical activity G. Advance directives H. List of other physicians I.  Hospitalizations, surgeries, and ER visits in previous 12 months J.  [place] to include hearing, vision, cognitive, depression L. Referrals and appointments - none  In addition, I have reviewed and discussed with patient certain preventive protocols, quality metrics, and best practice recommendations. A written personalized care plan for preventive services as well as general preventive health recommendations were provided to patient.  See attached scanned questionnaire for additional information.   Signed,   Tyson Dense, RN Nurse Health Advisor  Patient Concerns: None

## 2017-07-20 DIAGNOSIS — M62561 Muscle wasting and atrophy, not elsewhere classified, right lower leg: Secondary | ICD-10-CM | POA: Diagnosis not present

## 2017-07-20 DIAGNOSIS — M5387 Other specified dorsopathies, lumbosacral region: Secondary | ICD-10-CM | POA: Diagnosis not present

## 2017-07-20 DIAGNOSIS — R2689 Other abnormalities of gait and mobility: Secondary | ICD-10-CM | POA: Diagnosis not present

## 2017-07-20 DIAGNOSIS — M62562 Muscle wasting and atrophy, not elsewhere classified, left lower leg: Secondary | ICD-10-CM | POA: Diagnosis not present

## 2017-07-20 DIAGNOSIS — M25551 Pain in right hip: Secondary | ICD-10-CM | POA: Diagnosis not present

## 2017-07-20 DIAGNOSIS — M5489 Other dorsalgia: Secondary | ICD-10-CM | POA: Diagnosis not present

## 2017-07-21 DIAGNOSIS — M25551 Pain in right hip: Secondary | ICD-10-CM | POA: Diagnosis not present

## 2017-07-21 DIAGNOSIS — M62562 Muscle wasting and atrophy, not elsewhere classified, left lower leg: Secondary | ICD-10-CM | POA: Diagnosis not present

## 2017-07-21 DIAGNOSIS — M5387 Other specified dorsopathies, lumbosacral region: Secondary | ICD-10-CM | POA: Diagnosis not present

## 2017-07-21 DIAGNOSIS — R2689 Other abnormalities of gait and mobility: Secondary | ICD-10-CM | POA: Diagnosis not present

## 2017-07-21 DIAGNOSIS — M5489 Other dorsalgia: Secondary | ICD-10-CM | POA: Diagnosis not present

## 2017-07-21 DIAGNOSIS — M62561 Muscle wasting and atrophy, not elsewhere classified, right lower leg: Secondary | ICD-10-CM | POA: Diagnosis not present

## 2017-07-22 DIAGNOSIS — M62561 Muscle wasting and atrophy, not elsewhere classified, right lower leg: Secondary | ICD-10-CM | POA: Diagnosis not present

## 2017-07-22 DIAGNOSIS — R2689 Other abnormalities of gait and mobility: Secondary | ICD-10-CM | POA: Diagnosis not present

## 2017-07-22 DIAGNOSIS — M25551 Pain in right hip: Secondary | ICD-10-CM | POA: Diagnosis not present

## 2017-07-22 DIAGNOSIS — M5387 Other specified dorsopathies, lumbosacral region: Secondary | ICD-10-CM | POA: Diagnosis not present

## 2017-07-22 DIAGNOSIS — M5489 Other dorsalgia: Secondary | ICD-10-CM | POA: Diagnosis not present

## 2017-07-22 DIAGNOSIS — M62562 Muscle wasting and atrophy, not elsewhere classified, left lower leg: Secondary | ICD-10-CM | POA: Diagnosis not present

## 2017-07-25 ENCOUNTER — Encounter: Payer: Self-pay | Admitting: Adult Health

## 2017-07-25 ENCOUNTER — Non-Acute Institutional Stay (SKILLED_NURSING_FACILITY): Payer: Medicare Other | Admitting: Adult Health

## 2017-07-25 DIAGNOSIS — R05 Cough: Secondary | ICD-10-CM

## 2017-07-25 DIAGNOSIS — E1122 Type 2 diabetes mellitus with diabetic chronic kidney disease: Secondary | ICD-10-CM | POA: Diagnosis not present

## 2017-07-25 DIAGNOSIS — M8580 Other specified disorders of bone density and structure, unspecified site: Secondary | ICD-10-CM

## 2017-07-25 DIAGNOSIS — S32591D Other specified fracture of right pubis, subsequent encounter for fracture with routine healing: Secondary | ICD-10-CM | POA: Diagnosis not present

## 2017-07-25 DIAGNOSIS — I10 Essential (primary) hypertension: Secondary | ICD-10-CM | POA: Diagnosis not present

## 2017-07-25 DIAGNOSIS — R000 Tachycardia, unspecified: Secondary | ICD-10-CM | POA: Diagnosis not present

## 2017-07-25 DIAGNOSIS — F99 Mental disorder, not otherwise specified: Secondary | ICD-10-CM

## 2017-07-25 DIAGNOSIS — R2689 Other abnormalities of gait and mobility: Secondary | ICD-10-CM | POA: Diagnosis not present

## 2017-07-25 DIAGNOSIS — M62562 Muscle wasting and atrophy, not elsewhere classified, left lower leg: Secondary | ICD-10-CM | POA: Diagnosis not present

## 2017-07-25 DIAGNOSIS — N183 Chronic kidney disease, stage 3 unspecified: Secondary | ICD-10-CM

## 2017-07-25 DIAGNOSIS — R053 Chronic cough: Secondary | ICD-10-CM

## 2017-07-25 DIAGNOSIS — I5032 Chronic diastolic (congestive) heart failure: Secondary | ICD-10-CM

## 2017-07-25 DIAGNOSIS — M5489 Other dorsalgia: Secondary | ICD-10-CM | POA: Diagnosis not present

## 2017-07-25 DIAGNOSIS — E7800 Pure hypercholesterolemia, unspecified: Secondary | ICD-10-CM | POA: Diagnosis not present

## 2017-07-25 DIAGNOSIS — M62561 Muscle wasting and atrophy, not elsewhere classified, right lower leg: Secondary | ICD-10-CM | POA: Diagnosis not present

## 2017-07-25 DIAGNOSIS — M25551 Pain in right hip: Secondary | ICD-10-CM | POA: Diagnosis not present

## 2017-07-25 DIAGNOSIS — M5387 Other specified dorsopathies, lumbosacral region: Secondary | ICD-10-CM | POA: Diagnosis not present

## 2017-07-25 NOTE — Progress Notes (Signed)
Location:  Occupational psychologist of Service:  SNF (31)  Provider: Cindi Carbon, ANP [place] [phone]   PCP: Gayland Curry, DO Patient Care Team: Gayland Curry, DO as PCP - General (Geriatric Medicine) Belva Crome, MD as PCP - Cardiology (Cardiology) Gaynelle Arabian, MD as Consulting Physician (Orthopedic Surgery) Belva Crome, MD as Consulting Physician (Cardiology) Monna Fam, MD as Consulting Physician (Ophthalmology) Elsie Saas, MD as Consulting Physician (Orthopedic Surgery)  Extended Emergency Contact Information Primary Emergency Contact: [place] Phone: [phone] Relation: Daughter Secondary Emergency Contact: [place], [place] Mobile Phone: [phone] Relation: Niece  Code Status: Full code Goals of care:  Advanced Directive information Advanced Directives 07/19/2017  Does Patient Have a Medical Advance Directive? Yes  Type of Paramedic of [place];Living will  Does patient want to make changes to medical advance directive? No - Patient declined  Copy of [place] in Chart? Yes  Would patient like information on creating a medical advance directive? -  Pre-existing out of facility DNR order (yellow form or pink MOST form) -     Allergies  Allergen Reactions  . Amlodipine Besylate Other (See Comments)    Edema  . Lisinopril Cough    Cough   . Sulfamethoxazole-Trimethoprim Other (See Comments)    Unknown reaction     Chief Complaint  Patient presents with  . Discharge Note    HPI:  84 y.o. female seen for discharge from [facility] rehab after a fall and sustaining pelvic fractures.  She was admitted to the hospital 05/18/17-05/20/17 due to the fall and a CT of the pelvis on 05/18/17 showed acute displaced fracture right superior pubic ramus extending to the symphysis, as well as a right hip hematoma. She was admitted  to [facility] thereafter and initially had anxiety and pain which prevented progress. On 05/27/17 she was placed on Eliquis due to immobility for two weeks. Since that time she has made significant gains and is back to ambulating without assistance with her walker. She denies any issues with appetite, constipation, urination or breathing. She has a bothersome dry cough that is a long term problem and she uses tessalon perles for this problem which helps very little by her account. She denies any pain and has not needed any robaxin or oxycodone. When she arrived there was some short term memory loss noted with an MMSE in May on arrival to the rehab unit of 26/30.  Another MMSE was done one week ago off narcotics which showed an MMSE of 27/30, the only issues she had was difficulty with serial 7's.      Past Medical History:  Diagnosis Date  . Childhood asthma   . Chronic diastolic heart failure [facility]) 12/25/2012   October 2013 echocardiogram 1. There is moderate concentric left ventricle hypertrophy. 2. Left ventricular ejection fraction estimated by 2D at 60-Kelsey percent. 3. There were no regional wall motion abnormalities. 4. Mild mitral annular calcification. 5. Mild mitral valve regurgitation. 6. There is mild tricuspid regurgitation. 7. Mildly elevated estimated right ventricular systolic pressure. 8. Right ventricular systolic pressure estimated at 35-40 mm Hg. 9. Mild calcification of the aortic valve. 10. Trace aortic valve regurgitation. 11. Analysis of mitral valve inflow, pulmonary vein Doppler and tissue Doppler suggests grade I diastolic dysfunction without elevated left atrial pressure.   . Colon polyp   . Diverticular stricture ([place]) 2009  . Diverticulosis 10-30-2007  . Edema  09/12/13  . Fall 05/18/2017   tripped over wheel of walker; superior and inferior pubic ramus fractures   . Hypercholesterolemia   . Hypertension   . Hypertension, essential   . Hypothyroidism    as a teenager    . Indigestion   . Insomnia   . OA (osteoarthritis) of hip    right hip replacement, OA in bilateral knees  . Osteopenia    Actonel stopped in 2012 after 5years therapy  . Pneumonia 1939   "hospitalized"  . Rapid resting heart rate 15/17/6160   Uncertain cause. Possible ectopic atrial tachycardia. Date entered 12/25/12   . Right rotator cuff tear   . Seasonal allergies     Past Surgical History:  Procedure Laterality Date  . CATARACT EXTRACTION W/ INTRAOCULAR LENS  IMPLANT, BILATERAL Bilateral   . COLONOSCOPY  2009   Dr. Redmond School  . DILATION AND CURETTAGE OF UTERUS    . FRACTURE SURGERY    . JOINT REPLACEMENT    . ORIF WRIST FRACTURE Right   . REDUCTION MAMMAPLASTY  1999  . TOTAL HIP ARTHROPLASTY Right 03-04-08   Dr. Wynelle Link      reports that she has quit smoking. Her smoking use included cigarettes. She has a 6.00 pack-year smoking history. She has never used smokeless tobacco. She reports that she drinks alcohol. She reports that she does not use drugs. Social History   Socioeconomic History  . Marital status: Married    Spouse name: Not on file  . Number of children: Not on file  . Years of education: Not on file  . Highest education level: Not on file  Occupational History  . Occupation: retired travel Hydrologist Needs  . Financial resource strain: Not hard at all  . Food insecurity:    Worry: Never true    Inability: Never true  . Transportation needs:    Medical: No    Non-medical: No  Tobacco Use  . Smoking status: Former Smoker    Packs/day: 0.12    Years: 50.00    Pack years: 6.00    Types: Cigarettes  . Smokeless tobacco: Never Used  . Tobacco comment: "stopped in the ate 1990s"  Substance and Sexual Activity  . Alcohol use: Yes    Comment: rarely  . Drug use: Never  . Sexual activity: Not Currently  Lifestyle  . Physical activity:    Days per week: 7 days    Minutes per session: 20 min  . Stress: To some extent  Relationships  . Social  connections:    Talks on phone: More than three times a week    Gets together: More than three times a week    Attends religious service: More than 4 times per year    Active member of club or organization: Yes    Attends meetings of clubs or organizations: Never    Relationship status: Married  . Intimate partner violence:    Fear of current or ex partner: No    Emotionally abused: No    Physically abused: No    Forced sexual activity: No  Other Topics Concern  . Not on file  Social History Narrative   Lives at [facility] since 2010   Husband Ilona Sorrel married 1952 (husband in memory care unit at [employer])   Former smoker 1/2 PPD, for 30 years, stopped 01/10/1989   Alcohol 2 glasses of wine at night   Exercise: machines 3 times a week and daily walk   No POA/LW  Functional Status Survey:    Allergies  Allergen Reactions  . Amlodipine Besylate Other (See Comments)    Edema  . Lisinopril Cough    Cough   . Sulfamethoxazole-Trimethoprim Other (See Comments)    Unknown reaction     Pertinent  Health Maintenance Due  Topic Date Due  . FOOT EXAM  09/02/1936  . OPHTHALMOLOGY EXAM  09/02/1936  . PNA vac Low Risk Adult (2 of 2 - PPSV23) 06/04/2015  . INFLUENZA VACCINE  08/11/2017  . HEMOGLOBIN A1C  12/16/2017  . DEXA SCAN  Completed    Medications: Outpatient Encounter Medications as of 07/25/2017  Medication Sig  . benzonatate (TESSALON) 100 MG capsule Take 100 mg by mouth 3 (three) times daily as needed for cough.  Marland Kitchen acetaminophen (TYLENOL) 500 MG tablet Take 1,000 mg by mouth every 8 (eight) hours as needed.   Marland Kitchen alendronate (FOSAMAX) 70 MG tablet Take 70 mg by mouth once a week. Take with a full glass of water on an empty stomach.  Marland Kitchen aspirin EC 81 MG tablet Take 81 mg by mouth daily.  Marland Kitchen atorvastatin (LIPITOR) 10 MG tablet Take 10 mg by mouth daily. For cholesterol  . Cholecalciferol (VITAMIN D PO) Take 1 tablet by mouth every 7 (seven) days. 50,000 units  .  diclofenac sodium (VOLTAREN) 1 % GEL Apply 2 g topically every 6 (six) hours as needed.  . famotidine (PEPCID) 20 MG tablet Take 20 mg by mouth daily.  Marland Kitchen losartan-hydrochlorothiazide (HYZAAR) 100-25 MG tablet Take 1 tablet by mouth daily.  . metFORMIN (GLUCOPHAGE) 500 MG tablet TAKE (1/2) TABLET DAILY.  . methocarbamol (ROBAXIN) 500 MG tablet Take 500 mg by mouth 2 (two) times daily as needed for muscle spasms.  . metoprolol succinate (TOPROL-XL) 100 MG 24 hr tablet Take 150 mg by mouth 2 (two) times daily. Take with or immediately following a meal.  . oxyCODONE (OXY IR/ROXICODONE) 5 MG immediate release tablet Take 5 mg by mouth 2 (two) times daily as needed for severe pain.  . polyethylene glycol (MIRALAX / GLYCOLAX) packet Take 17 g by mouth daily as needed for mild constipation.   No facility-administered encounter medications on file as of 07/25/2017.     Review of Systems  Constitutional: Negative for activity change, appetite change, chills, diaphoresis, fatigue, fever and unexpected weight change.  HENT: Negative for congestion.   Respiratory: Positive for cough. Negative for shortness of breath and wheezing.   Cardiovascular: Positive for leg swelling. Negative for chest pain and palpitations.  Gastrointestinal: Negative for abdominal distention, abdominal pain, constipation and diarrhea.  Genitourinary: Negative for difficulty urinating and dysuria.  Musculoskeletal: Positive for gait problem. Negative for arthralgias, back pain, joint swelling and myalgias.  Neurological: Negative for dizziness, tremors, seizures, syncope, facial asymmetry, speech difficulty, weakness, light-headedness, numbness and headaches.  Psychiatric/Behavioral: Negative for agitation, behavioral problems and confusion.       Short term memory loss at times    Vitals:   07/25/17 1307  BP: 134/83  Pulse: 97  Resp: 20  Temp: 97.8 F (36.6 C)  SpO2: 93%  Weight: 174 lb 6.4 oz (79.1 kg)   Body mass  index is 27.31 kg/m. Physical Exam  Constitutional: She is oriented to person, place, and time. No distress.  Cardiovascular: Normal rate and regular rhythm.  No murmur heard. Pulmonary/Chest: Effort normal and breath sounds normal. No respiratory distress.  Abdominal: Soft. Bowel sounds are normal.  Musculoskeletal: She exhibits no edema or tenderness.  Neurological: She is  alert and oriented to person, place, and time.  Skin: Skin is warm and dry. She is not diaphoretic.    Labs reviewed: Basic Metabolic Panel: Recent Labs    05/18/17 1900 05/18/17 1918 05/19/17 0434 06/02/17 1111  NA 140 141 140 137  K 3.3* 3.3* 4.2 4.0  CL 103 103 106  --   CO2 26  --  27  --   GLUCOSE 108* 103* 132*  --   BUN 20 23* 22* 30*  CREATININE 1.05* 1.00 1.07* 0.9  CALCIUM 8.9  --  8.8*  --   MG 1.8  --   --   --    Liver Function Tests: Recent Labs    05/19/17 0434  ALBUMIN 3.4*   No results for input(s): LIPASE, AMYLASE in the last 8760 hours. No results for input(s): AMMONIA in the last 8760 hours. CBC: Recent Labs    05/18/17 1900  05/19/17 0434 05/20/17 0450 05/30/17 06/06/17 0300  WBC 13.9*  --  9.0 10.0 9.0 10.1  NEUTROABS 10.9*  --   --   --   --   --   HGB 13.5   < > 12.3 11.2* 11.6* 11.8*  HCT 42.6   < > 39.4 35.9* 35* 36  MCV 95.7  --  95.9 96.2  --   --   PLT 169  --  167 130* 283 315   < > = values in this interval not displayed.   Cardiac Enzymes: No results for input(s): CKTOTAL, CKMB, CKMBINDEX, TROPONINI in the last 8760 hours. BNP: Invalid input(s): POCBNP CBG: Recent Labs    05/19/17 2205 05/20/17 0643 05/20/17 1206  GLUCAP 127* 115* 132*    Procedures and Imaging Studies During Stay: No results found.  Assessment/Plan:    1. Closed fracture of multiple rami of right pubis with routine healing, subsequent encounter Improved pain and mobility She is ready for discharge and will continue exercises and will have personal care givers to assist with  her needs  2. Chronic diastolic heart failure (HCC) Compensated Continue Hyzaar  3. Essential hypertension Controlled  4. Rapid resting heart rate Controlled with metoprolol Followed by cardiology  5. Type 2 diabetes mellitus with stage 3 chronic kidney disease, without long-term current use of insulin (HCC) A1C 6.1 Continue metformin  6. Chronic cough No improvement, she hopes that by moving back to her home that it will improve due to the dry air/cleaning supplies F/U with Dr. Mariea Clonts as needed and continue prn tessalon perles  7. Hypercholesterolemia LDL 79 Continue lipitor 10 mg qd  8. Osteopenia Continue Fosamax Kelsey considered changing to Prolia in the out pt setting due to pelvic fx  9. Abnormal MMSE 27/30 with difficulty in serial 7's, mild short term memory loss noted by staff Kelsey does not seem to interfere with her care.   Patient is being discharged with the following home health services:    Patient is being discharged with the following durable medical equipment:    Patient has been advised to f/u with their PCP in 1-2 weeks to for a transitions of care visit.  Social services at their facility was responsible for arranging this appointment.  Pt was provided with adequate prescriptions of noncontrolled medications to reach the scheduled appointment .  For controlled substances, a limited supply was provided as appropriate for the individual patient.  If the pt normally receives these medications from a pain clinic or has a contract with another physician, these medications should be  received from that clinic or physician only).    Future labs/tests needed:  A1C q 6 months, A1C annually, CBC/BMP periodically

## 2017-07-26 DIAGNOSIS — M25551 Pain in right hip: Secondary | ICD-10-CM | POA: Diagnosis not present

## 2017-07-26 DIAGNOSIS — M5489 Other dorsalgia: Secondary | ICD-10-CM | POA: Diagnosis not present

## 2017-07-26 DIAGNOSIS — M62562 Muscle wasting and atrophy, not elsewhere classified, left lower leg: Secondary | ICD-10-CM | POA: Diagnosis not present

## 2017-07-26 DIAGNOSIS — R2689 Other abnormalities of gait and mobility: Secondary | ICD-10-CM | POA: Diagnosis not present

## 2017-07-26 DIAGNOSIS — M62561 Muscle wasting and atrophy, not elsewhere classified, right lower leg: Secondary | ICD-10-CM | POA: Diagnosis not present

## 2017-07-26 DIAGNOSIS — M5387 Other specified dorsopathies, lumbosacral region: Secondary | ICD-10-CM | POA: Diagnosis not present

## 2017-07-27 DIAGNOSIS — M62561 Muscle wasting and atrophy, not elsewhere classified, right lower leg: Secondary | ICD-10-CM | POA: Diagnosis not present

## 2017-07-27 DIAGNOSIS — M5387 Other specified dorsopathies, lumbosacral region: Secondary | ICD-10-CM | POA: Diagnosis not present

## 2017-07-27 DIAGNOSIS — R2689 Other abnormalities of gait and mobility: Secondary | ICD-10-CM | POA: Diagnosis not present

## 2017-07-27 DIAGNOSIS — M62562 Muscle wasting and atrophy, not elsewhere classified, left lower leg: Secondary | ICD-10-CM | POA: Diagnosis not present

## 2017-07-27 DIAGNOSIS — M25551 Pain in right hip: Secondary | ICD-10-CM | POA: Diagnosis not present

## 2017-07-27 DIAGNOSIS — M5489 Other dorsalgia: Secondary | ICD-10-CM | POA: Diagnosis not present

## 2017-07-28 DIAGNOSIS — S32591D Other specified fracture of right pubis, subsequent encounter for fracture with routine healing: Secondary | ICD-10-CM | POA: Diagnosis not present

## 2017-07-28 DIAGNOSIS — M170 Bilateral primary osteoarthritis of knee: Secondary | ICD-10-CM | POA: Diagnosis not present

## 2017-07-29 DIAGNOSIS — M25551 Pain in right hip: Secondary | ICD-10-CM | POA: Diagnosis not present

## 2017-07-29 DIAGNOSIS — M5387 Other specified dorsopathies, lumbosacral region: Secondary | ICD-10-CM | POA: Diagnosis not present

## 2017-07-29 DIAGNOSIS — R2689 Other abnormalities of gait and mobility: Secondary | ICD-10-CM | POA: Diagnosis not present

## 2017-07-29 DIAGNOSIS — M5489 Other dorsalgia: Secondary | ICD-10-CM | POA: Diagnosis not present

## 2017-07-29 DIAGNOSIS — M62562 Muscle wasting and atrophy, not elsewhere classified, left lower leg: Secondary | ICD-10-CM | POA: Diagnosis not present

## 2017-07-29 DIAGNOSIS — M62561 Muscle wasting and atrophy, not elsewhere classified, right lower leg: Secondary | ICD-10-CM | POA: Diagnosis not present

## 2017-08-02 DIAGNOSIS — M5387 Other specified dorsopathies, lumbosacral region: Secondary | ICD-10-CM | POA: Diagnosis not present

## 2017-08-02 DIAGNOSIS — M62561 Muscle wasting and atrophy, not elsewhere classified, right lower leg: Secondary | ICD-10-CM | POA: Diagnosis not present

## 2017-08-02 DIAGNOSIS — M62562 Muscle wasting and atrophy, not elsewhere classified, left lower leg: Secondary | ICD-10-CM | POA: Diagnosis not present

## 2017-08-02 DIAGNOSIS — M25551 Pain in right hip: Secondary | ICD-10-CM | POA: Diagnosis not present

## 2017-08-02 DIAGNOSIS — M5489 Other dorsalgia: Secondary | ICD-10-CM | POA: Diagnosis not present

## 2017-08-02 DIAGNOSIS — R2689 Other abnormalities of gait and mobility: Secondary | ICD-10-CM | POA: Diagnosis not present

## 2017-08-03 ENCOUNTER — Non-Acute Institutional Stay: Payer: Medicare Other | Admitting: Internal Medicine

## 2017-08-03 ENCOUNTER — Encounter: Payer: Self-pay | Admitting: Internal Medicine

## 2017-08-03 VITALS — BP 120/80 | HR 100 | Temp 98.3°F | Ht 67.0 in | Wt 178.0 lb

## 2017-08-03 DIAGNOSIS — M5489 Other dorsalgia: Secondary | ICD-10-CM | POA: Diagnosis not present

## 2017-08-03 DIAGNOSIS — E1122 Type 2 diabetes mellitus with diabetic chronic kidney disease: Secondary | ICD-10-CM

## 2017-08-03 DIAGNOSIS — N183 Chronic kidney disease, stage 3 unspecified: Secondary | ICD-10-CM

## 2017-08-03 DIAGNOSIS — R2689 Other abnormalities of gait and mobility: Secondary | ICD-10-CM | POA: Diagnosis not present

## 2017-08-03 DIAGNOSIS — R053 Chronic cough: Secondary | ICD-10-CM

## 2017-08-03 DIAGNOSIS — R05 Cough: Secondary | ICD-10-CM

## 2017-08-03 DIAGNOSIS — I10 Essential (primary) hypertension: Secondary | ICD-10-CM | POA: Diagnosis not present

## 2017-08-03 DIAGNOSIS — M25551 Pain in right hip: Secondary | ICD-10-CM | POA: Diagnosis not present

## 2017-08-03 DIAGNOSIS — I5032 Chronic diastolic (congestive) heart failure: Secondary | ICD-10-CM

## 2017-08-03 DIAGNOSIS — S32591D Other specified fracture of right pubis, subsequent encounter for fracture with routine healing: Secondary | ICD-10-CM

## 2017-08-03 DIAGNOSIS — R413 Other amnesia: Secondary | ICD-10-CM

## 2017-08-03 DIAGNOSIS — M5387 Other specified dorsopathies, lumbosacral region: Secondary | ICD-10-CM | POA: Diagnosis not present

## 2017-08-03 DIAGNOSIS — M62562 Muscle wasting and atrophy, not elsewhere classified, left lower leg: Secondary | ICD-10-CM | POA: Diagnosis not present

## 2017-08-03 DIAGNOSIS — M62561 Muscle wasting and atrophy, not elsewhere classified, right lower leg: Secondary | ICD-10-CM | POA: Diagnosis not present

## 2017-08-03 MED ORDER — DONEPEZIL HCL 5 MG PO TABS: 5.0000 mg | ORAL_TABLET | Freq: Every day | ORAL | 3 refills | Status: DC

## 2017-08-03 NOTE — Progress Notes (Signed)
Location:  Occupational psychologist of Service:  Clinic (12)  Provider: Abraham Entwistle L. Mariea Clonts, D.O., C.M.D.  Code Status: full code Goals of Care:  Advanced Directives 08/03/2017  Does Patient Have a Medical Advance Directive? Yes  Type of Paramedic of [place];Living will  Does patient want to make changes to medical advance directive? No - Patient declined  Copy of [place] in Chart? Yes  Would patient like information on creating a medical advance directive? -  Pre-existing out of facility DNR order (yellow form or pink MOST form) -     Chief Complaint  Patient presents with  . Medical Management of Chronic Issues    Rehab follow-up    HPI: Patient is a 84 y.o. female seen today for medical management of chronic diseases/ f/u after rehab stay for pelvic fractures.  She's doing ok at home.  She's still not completely confident.  She has someone stay overnight b/c she gets up more than once.  If knee hurts, she starts to feel week.  She does not want knee surgery--getting one shot every three weeks coming up.    She is finding she is getting a little forgetful.    We discussed aricept.  She is writing more things down like to push the button in the morning so they don't come checking on her. She will forget things her daughter says.    She does need help getting her compression hose on and she's afraid she can't get them off.    Right hip hurts sometimes and that's where she's had pain all along since fall and pelvic fxs.    She is still coughing.  She thinks there is something in [facility] that she's allergic to.  She is using the nasal spray.  Not coughing as much.  She wants to go to the beach  Which she is doing in August.  Thinks it will be gone there.  Does not want anything else done now.  DMII:  hba1c 6.1 improved from 6.6.  She's avoiding sweets now.  She does occasionally break down and have ice cream.  Had lost  weight, but it's back up.  Past Medical History:  Diagnosis Date  . Childhood asthma   . Chronic diastolic heart failure [facility]) 12/25/2012   October 2013 echocardiogram 1. There is moderate concentric left ventricle hypertrophy. 2. Left ventricular ejection fraction estimated by 2D at 60-65 percent. 3. There were no regional wall motion abnormalities. 4. Mild mitral annular calcification. 5. Mild mitral valve regurgitation. 6. There is mild tricuspid regurgitation. 7. Mildly elevated estimated right ventricular systolic pressure. 8. Right ventricular systolic pressure estimated at 35-40 mm Hg. 9. Mild calcification of the aortic valve. 10. Trace aortic valve regurgitation. 11. Analysis of mitral valve inflow, pulmonary vein Doppler and tissue Doppler suggests grade I diastolic dysfunction without elevated left atrial pressure.   . Colon polyp   . Diverticular stricture ([place]) 2009  . Diverticulosis 10-30-2007  . Edema 09/12/13  . Fall 05/18/2017   tripped over wheel of walker; superior and inferior pubic ramus fractures   . Hypercholesterolemia   . Hypertension   . Hypertension, essential   . Hypothyroidism    as a teenager  . Indigestion   . Insomnia   . OA (osteoarthritis) of hip    right hip replacement, OA in bilateral knees  . Osteopenia    Actonel stopped in 2012 after 5years therapy  . Pneumonia 1939   "  hospitalized"  . Rapid resting heart rate 41/96/2229   Uncertain cause. Possible ectopic atrial tachycardia. Date entered 12/25/12   . Right rotator cuff tear   . Seasonal allergies     Past Surgical History:  Procedure Laterality Date  . CATARACT EXTRACTION W/ INTRAOCULAR LENS  IMPLANT, BILATERAL Bilateral   . COLONOSCOPY  2009   Dr. Redmond School  . DILATION AND CURETTAGE OF UTERUS    . FRACTURE SURGERY    . JOINT REPLACEMENT    . ORIF WRIST FRACTURE Right   . REDUCTION MAMMAPLASTY  1999  . TOTAL HIP ARTHROPLASTY Right 03-04-08   Dr. Wynelle Link    Allergies  Allergen  Reactions  . Amlodipine Besylate Other (See Comments)    Edema  . Lisinopril Cough    Cough   . Sulfamethoxazole-Trimethoprim Other (See Comments)    Unknown reaction     Outpatient Encounter Medications as of 08/03/2017  Medication Sig  . alendronate (FOSAMAX) 70 MG tablet Take 70 mg by mouth once a week. Take with a full glass of water on an empty stomach.  Marland Kitchen aspirin EC 81 MG tablet Take 81 mg by mouth daily.  Marland Kitchen atorvastatin (LIPITOR) 10 MG tablet Take 10 mg by mouth daily. For cholesterol  . Cholecalciferol (VITAMIN D PO) Take 1 tablet by mouth every 7 (seven) days. 50,000 units  . diclofenac sodium (VOLTAREN) 1 % GEL Apply 2 g topically every 6 (six) hours as needed.  . famotidine (PEPCID) 20 MG tablet Take 20 mg by mouth daily.  Marland Kitchen losartan-hydrochlorothiazide (HYZAAR) 100-25 MG tablet Take 1 tablet by mouth daily.  . metFORMIN (GLUCOPHAGE) 500 MG tablet TAKE (1/2) TABLET DAILY.  . metoprolol succinate (TOPROL-XL) 100 MG 24 hr tablet Take 150 mg by mouth 2 (two) times daily. Take with or immediately following a meal.  . [DISCONTINUED] acetaminophen (TYLENOL) 500 MG tablet Take 1,000 mg by mouth every 8 (eight) hours as needed.   . [DISCONTINUED] benzonatate (TESSALON) 100 MG capsule Take 100 mg by mouth 3 (three) times daily as needed for cough.  . [DISCONTINUED] polyethylene glycol (MIRALAX / GLYCOLAX) packet Take 17 g by mouth daily as needed for mild constipation.   No facility-administered encounter medications on file as of 08/03/2017.     Review of Systems:  Review of Systems  Constitutional: Negative for chills, diaphoresis, fever, malaise/fatigue and weight loss.  HENT: Positive for congestion and hearing loss.   Eyes: Negative for blurred vision.       Glasses  Respiratory: Positive for cough. Negative for sputum production, shortness of breath and wheezing.   Cardiovascular: Negative for chest pain, palpitations and leg swelling.  Gastrointestinal: Negative for  abdominal pain, blood in stool, constipation and melena.  Genitourinary: Negative for dysuria.  Musculoskeletal: Positive for joint pain. Negative for falls.       No more falls  Skin: Negative for itching and rash.  Neurological: Negative for dizziness, tingling, sensory change and loss of consciousness.  Endo/Heme/Allergies: Bruises/bleeds easily.  Psychiatric/Behavioral: Positive for memory loss. Negative for depression. The patient is not nervous/anxious and does not have insomnia.     Health Maintenance  Topic Date Due  . FOOT EXAM  09/02/1936  . OPHTHALMOLOGY EXAM  09/02/1936  . INFLUENZA VACCINE  08/11/2017  . HEMOGLOBIN A1C  12/16/2017  . TETANUS/TDAP  07/21/2027  . DEXA SCAN  Completed  . PNA vac Low Risk Adult  Completed    Physical Exam: Vitals:   08/03/17 1428  BP: 120/80  Pulse:  100  Temp: 98.3 F (36.8 C)  TempSrc: Oral  SpO2: 95%  Weight: 178 lb (80.7 kg)  Height: 5\' 7"  (1.702 m)   Body mass index is 27.88 kg/m. Physical Exam  Constitutional: She is oriented to person, place, and time. She appears well-developed and well-nourished. No distress.  Cardiovascular: Normal rate, regular rhythm, normal heart sounds and intact distal pulses.  Pulmonary/Chest: Effort normal and breath sounds normal. No respiratory distress.  Abdominal: Soft. Bowel sounds are normal.  Musculoskeletal: Normal range of motion. She exhibits no tenderness.  Neurological: She is alert and oriented to person, place, and time.  Forgetting some information given to her now; seems to forget various things tried for coughing over the past 1-2 yrs and refused med change long term for bp   Skin: Skin is warm and dry. Capillary refill takes less than 2 seconds.  Psychiatric: She has a normal mood and affect.  Pleasant with me; degrading CNA with her--"we're not stupid, we can figure this out" about wheelchair pedals    Labs reviewed: Basic Metabolic Panel: Recent Labs    05/18/17 1900  05/18/17 1918 05/19/17 0434 06/02/17 1111  NA 140 141 140 137  K 3.3* 3.3* 4.2 4.0  CL 103 103 106  --   CO2 26  --  27  --   GLUCOSE 108* 103* 132*  --   BUN 20 23* 22* 30*  CREATININE 1.05* 1.00 1.07* 0.9  CALCIUM 8.9  --  8.8*  --   MG 1.8  --   --   --    Liver Function Tests: Recent Labs    05/19/17 0434  ALBUMIN 3.4*   No results for input(s): LIPASE, AMYLASE in the last 8760 hours. No results for input(s): AMMONIA in the last 8760 hours. CBC: Recent Labs    05/18/17 1900  05/19/17 0434 05/20/17 0450 05/30/17 06/06/17 0300  WBC 13.9*  --  9.0 10.0 9.0 10.1  NEUTROABS 10.9*  --   --   --   --   --   HGB 13.5   < > 12.3 11.2* 11.6* 11.8*  HCT 42.6   < > 39.4 35.9* 35* 36  MCV 95.7  --  95.9 96.2  --   --   PLT 169  --  167 130* 283 315   < > = values in this interval not displayed.   Lipid Panel: Recent Labs    10/26/16 06/02/17 1111  CHOL 167 156  HDL 61 52  LDLCALC 79 79  TRIG 138 127   Lab Results  Component Value Date   HGBA1C 6.1 06/16/2017   Assessment/Plan 1. Short-term memory loss - she is aware now of some memory loss, staff and myself have noticed this gradually and she requested to start medication--side effects reviewed and she agrees - donepezil (ARICEPT) 5 MG tablet; Take 1 tablet (5 mg total) by mouth at bedtime.  Dispense: 30 tablet; Refill: 3  2. Chronic diastolic heart failure (HCC) -cont arb with hctz for now, may benefit from alternative med due to cough  3. Essential hypertension -bp well controlled with current regimen  4. Type 2 diabetes mellitus with stage 3 chronic kidney disease, without long-term current use of insulin (HCC) -well controlled (improved) cont metformin and dietary changes, asa, statin  5. Chronic cough -insists it's allergies and will go away at the beach -oddly went away previous rehab stay but I still think it was b/c arb was on hold--she does not agree  6.  Pelvic fxs -was started on fosamax in  march, the fell and fxed in May -seems too early to tell if fosamax helped and was not on long enough for a fragility fx  Labs/tests ordered:  Cbc, cmp, hba1c before Next appt:  12/07/2017  Telitha Plath L. Tuwana Kapaun, D.O. [facility] [address], [place] [address] Cell Phone (Mon-Fri 8am-5pm):  [phone] On Call:  [phone] & follow prompts after 5pm & weekends Office Phone:  [phone] Office Fax:  [phone]

## 2017-08-04 DIAGNOSIS — M62561 Muscle wasting and atrophy, not elsewhere classified, right lower leg: Secondary | ICD-10-CM | POA: Diagnosis not present

## 2017-08-04 DIAGNOSIS — R2689 Other abnormalities of gait and mobility: Secondary | ICD-10-CM | POA: Diagnosis not present

## 2017-08-04 DIAGNOSIS — M5489 Other dorsalgia: Secondary | ICD-10-CM | POA: Diagnosis not present

## 2017-08-04 DIAGNOSIS — M5387 Other specified dorsopathies, lumbosacral region: Secondary | ICD-10-CM | POA: Diagnosis not present

## 2017-08-04 DIAGNOSIS — M62562 Muscle wasting and atrophy, not elsewhere classified, left lower leg: Secondary | ICD-10-CM | POA: Diagnosis not present

## 2017-08-04 DIAGNOSIS — M25551 Pain in right hip: Secondary | ICD-10-CM | POA: Diagnosis not present

## 2017-08-05 DIAGNOSIS — M62562 Muscle wasting and atrophy, not elsewhere classified, left lower leg: Secondary | ICD-10-CM | POA: Diagnosis not present

## 2017-08-05 DIAGNOSIS — R2689 Other abnormalities of gait and mobility: Secondary | ICD-10-CM | POA: Diagnosis not present

## 2017-08-05 DIAGNOSIS — M5387 Other specified dorsopathies, lumbosacral region: Secondary | ICD-10-CM | POA: Diagnosis not present

## 2017-08-05 DIAGNOSIS — M25551 Pain in right hip: Secondary | ICD-10-CM | POA: Diagnosis not present

## 2017-08-05 DIAGNOSIS — M62561 Muscle wasting and atrophy, not elsewhere classified, right lower leg: Secondary | ICD-10-CM | POA: Diagnosis not present

## 2017-08-05 DIAGNOSIS — M5489 Other dorsalgia: Secondary | ICD-10-CM | POA: Diagnosis not present

## 2017-08-09 DIAGNOSIS — M5489 Other dorsalgia: Secondary | ICD-10-CM | POA: Diagnosis not present

## 2017-08-09 DIAGNOSIS — M5387 Other specified dorsopathies, lumbosacral region: Secondary | ICD-10-CM | POA: Diagnosis not present

## 2017-08-09 DIAGNOSIS — M25551 Pain in right hip: Secondary | ICD-10-CM | POA: Diagnosis not present

## 2017-08-09 DIAGNOSIS — R2689 Other abnormalities of gait and mobility: Secondary | ICD-10-CM | POA: Diagnosis not present

## 2017-08-09 DIAGNOSIS — M62562 Muscle wasting and atrophy, not elsewhere classified, left lower leg: Secondary | ICD-10-CM | POA: Diagnosis not present

## 2017-08-09 DIAGNOSIS — M62561 Muscle wasting and atrophy, not elsewhere classified, right lower leg: Secondary | ICD-10-CM | POA: Diagnosis not present

## 2017-08-10 DIAGNOSIS — M5387 Other specified dorsopathies, lumbosacral region: Secondary | ICD-10-CM | POA: Diagnosis not present

## 2017-08-10 DIAGNOSIS — R2689 Other abnormalities of gait and mobility: Secondary | ICD-10-CM | POA: Diagnosis not present

## 2017-08-10 DIAGNOSIS — M5489 Other dorsalgia: Secondary | ICD-10-CM | POA: Diagnosis not present

## 2017-08-10 DIAGNOSIS — M25551 Pain in right hip: Secondary | ICD-10-CM | POA: Diagnosis not present

## 2017-08-10 DIAGNOSIS — M62561 Muscle wasting and atrophy, not elsewhere classified, right lower leg: Secondary | ICD-10-CM | POA: Diagnosis not present

## 2017-08-10 DIAGNOSIS — M62562 Muscle wasting and atrophy, not elsewhere classified, left lower leg: Secondary | ICD-10-CM | POA: Diagnosis not present

## 2017-08-11 DIAGNOSIS — M84454S Pathological fracture, pelvis, sequela: Secondary | ICD-10-CM | POA: Diagnosis not present

## 2017-08-11 DIAGNOSIS — M810 Age-related osteoporosis without current pathological fracture: Secondary | ICD-10-CM | POA: Diagnosis not present

## 2017-08-11 DIAGNOSIS — M62561 Muscle wasting and atrophy, not elsewhere classified, right lower leg: Secondary | ICD-10-CM | POA: Diagnosis not present

## 2017-08-11 DIAGNOSIS — R278 Other lack of coordination: Secondary | ICD-10-CM | POA: Diagnosis not present

## 2017-08-11 DIAGNOSIS — Z9181 History of falling: Secondary | ICD-10-CM | POA: Diagnosis not present

## 2017-08-11 DIAGNOSIS — R2689 Other abnormalities of gait and mobility: Secondary | ICD-10-CM | POA: Diagnosis not present

## 2017-08-11 DIAGNOSIS — M62562 Muscle wasting and atrophy, not elsewhere classified, left lower leg: Secondary | ICD-10-CM | POA: Diagnosis not present

## 2017-08-12 DIAGNOSIS — M62562 Muscle wasting and atrophy, not elsewhere classified, left lower leg: Secondary | ICD-10-CM | POA: Diagnosis not present

## 2017-08-12 DIAGNOSIS — R2689 Other abnormalities of gait and mobility: Secondary | ICD-10-CM | POA: Diagnosis not present

## 2017-08-12 DIAGNOSIS — M62561 Muscle wasting and atrophy, not elsewhere classified, right lower leg: Secondary | ICD-10-CM | POA: Diagnosis not present

## 2017-08-12 DIAGNOSIS — R278 Other lack of coordination: Secondary | ICD-10-CM | POA: Diagnosis not present

## 2017-08-12 DIAGNOSIS — M84454S Pathological fracture, pelvis, sequela: Secondary | ICD-10-CM | POA: Diagnosis not present

## 2017-08-12 DIAGNOSIS — M810 Age-related osteoporosis without current pathological fracture: Secondary | ICD-10-CM | POA: Diagnosis not present

## 2017-08-15 DIAGNOSIS — R2689 Other abnormalities of gait and mobility: Secondary | ICD-10-CM | POA: Diagnosis not present

## 2017-08-15 DIAGNOSIS — R278 Other lack of coordination: Secondary | ICD-10-CM | POA: Diagnosis not present

## 2017-08-15 DIAGNOSIS — M810 Age-related osteoporosis without current pathological fracture: Secondary | ICD-10-CM | POA: Diagnosis not present

## 2017-08-15 DIAGNOSIS — M84454S Pathological fracture, pelvis, sequela: Secondary | ICD-10-CM | POA: Diagnosis not present

## 2017-08-15 DIAGNOSIS — M62562 Muscle wasting and atrophy, not elsewhere classified, left lower leg: Secondary | ICD-10-CM | POA: Diagnosis not present

## 2017-08-15 DIAGNOSIS — M62561 Muscle wasting and atrophy, not elsewhere classified, right lower leg: Secondary | ICD-10-CM | POA: Diagnosis not present

## 2017-08-16 ENCOUNTER — Other Ambulatory Visit: Payer: Self-pay | Admitting: Internal Medicine

## 2017-08-17 DIAGNOSIS — M810 Age-related osteoporosis without current pathological fracture: Secondary | ICD-10-CM | POA: Diagnosis not present

## 2017-08-17 DIAGNOSIS — M62561 Muscle wasting and atrophy, not elsewhere classified, right lower leg: Secondary | ICD-10-CM | POA: Diagnosis not present

## 2017-08-17 DIAGNOSIS — R278 Other lack of coordination: Secondary | ICD-10-CM | POA: Diagnosis not present

## 2017-08-17 DIAGNOSIS — M62562 Muscle wasting and atrophy, not elsewhere classified, left lower leg: Secondary | ICD-10-CM | POA: Diagnosis not present

## 2017-08-17 DIAGNOSIS — M84454S Pathological fracture, pelvis, sequela: Secondary | ICD-10-CM | POA: Diagnosis not present

## 2017-08-17 DIAGNOSIS — R2689 Other abnormalities of gait and mobility: Secondary | ICD-10-CM | POA: Diagnosis not present

## 2017-08-18 DIAGNOSIS — M1712 Unilateral primary osteoarthritis, left knee: Secondary | ICD-10-CM | POA: Diagnosis not present

## 2017-08-18 DIAGNOSIS — M1711 Unilateral primary osteoarthritis, right knee: Secondary | ICD-10-CM | POA: Diagnosis not present

## 2017-08-20 ENCOUNTER — Other Ambulatory Visit: Payer: Self-pay | Admitting: Internal Medicine

## 2017-08-24 DIAGNOSIS — M1712 Unilateral primary osteoarthritis, left knee: Secondary | ICD-10-CM | POA: Diagnosis not present

## 2017-08-24 DIAGNOSIS — M1711 Unilateral primary osteoarthritis, right knee: Secondary | ICD-10-CM | POA: Diagnosis not present

## 2017-08-31 DIAGNOSIS — M170 Bilateral primary osteoarthritis of knee: Secondary | ICD-10-CM | POA: Diagnosis not present

## 2017-09-08 ENCOUNTER — Telehealth: Payer: Self-pay | Admitting: *Deleted

## 2017-09-08 NOTE — Telephone Encounter (Signed)
Kelsey Carbon, NP called requesting Dr. Mariea Clonts be notified that the family is questioning pt's driving privilege's. I advised to Kelsey Salinas that there is a form that is sent to the [facility] anonymously and the DMV will then get involved. Family does not want the pt know they are concerned or asked that this be done.  Form in your folder to be signed and faxed to [facility].

## 2017-09-14 NOTE — Telephone Encounter (Signed)
Family can actually report the patient to [facility] themselves also.

## 2017-09-28 ENCOUNTER — Non-Acute Institutional Stay: Payer: Medicare Other | Admitting: Internal Medicine

## 2017-09-28 ENCOUNTER — Encounter: Payer: Self-pay | Admitting: Internal Medicine

## 2017-09-28 VITALS — BP 120/70 | HR 102 | Temp 98.2°F | Ht 67.0 in | Wt 176.0 lb

## 2017-09-28 DIAGNOSIS — I10 Essential (primary) hypertension: Secondary | ICD-10-CM

## 2017-09-28 DIAGNOSIS — N183 Chronic kidney disease, stage 3 unspecified: Secondary | ICD-10-CM

## 2017-09-28 DIAGNOSIS — I5032 Chronic diastolic (congestive) heart failure: Secondary | ICD-10-CM | POA: Diagnosis not present

## 2017-09-28 DIAGNOSIS — E1122 Type 2 diabetes mellitus with diabetic chronic kidney disease: Secondary | ICD-10-CM

## 2017-09-28 DIAGNOSIS — Z9189 Other specified personal risk factors, not elsewhere classified: Secondary | ICD-10-CM | POA: Diagnosis not present

## 2017-09-28 DIAGNOSIS — S32591D Other specified fracture of right pubis, subsequent encounter for fracture with routine healing: Secondary | ICD-10-CM | POA: Diagnosis not present

## 2017-09-28 DIAGNOSIS — R413 Other amnesia: Secondary | ICD-10-CM | POA: Diagnosis not present

## 2017-09-28 NOTE — Progress Notes (Signed)
Location:  [place] of Service:  Clinic (12)  Provider: Makhya Arave L. Mariea Clonts, D.O., C.M.D.  Code Status: FULL CODE Goals of Care:  Advanced Directives 08/03/2017  Does Patient Have a Medical Advance Directive? Yes  Type of Paramedic of [place];Living will  Does patient want to make changes to medical advance directive? No - Patient declined  Copy of [place] in Chart? Yes  Would patient like information on creating a medical advance directive? -  Pre-existing out of facility DNR order (yellow form or pink MOST form) -   Chief Complaint  Patient presents with  . Acute Visit    Fill out DMV paperwork    HPI: Patient is a 84 y.o. female seen today for an acute visit for [facility] paperwork to be completed.  Her son had contacted our office with concerns about his mother's driving due to her declining cognition and difficulties ambulating since her fall with pelvic fracture.  She is upset and wants to know why she got this form.  When I entered, she was convinced it was due to her age and I did not argue with that.  We did discuss her progressive memory loss since her fall and pelvic fracture and she agrees that she does not remember as well.  She also cannot move and respond as quickly which is necessary for safe driving.  DMV paperwork will be completed.  Pt to take ophthalmology part to that office and then bring it back here to be added to the packet and submitted to [facility].    Past Medical History:  Diagnosis Date  . Childhood asthma   . Chronic diastolic heart failure [facility]) 12/25/2012   October 2013 echocardiogram 1. There is moderate concentric left ventricle hypertrophy. 2. Left ventricular ejection fraction estimated by 2D at 60-65 percent. 3. There were no regional wall motion abnormalities. 4. Mild mitral annular calcification. 5. Mild mitral valve regurgitation. 6. There is mild tricuspid regurgitation. 7. Mildly elevated estimated  right ventricular systolic pressure. 8. Right ventricular systolic pressure estimated at 35-40 mm Hg. 9. Mild calcification of the aortic valve. 10. Trace aortic valve regurgitation. 11. Analysis of mitral valve inflow, pulmonary vein Doppler and tissue Doppler suggests grade I diastolic dysfunction without elevated left atrial pressure.   . Colon polyp   . Diverticular stricture ([place]) 2009  . Diverticulosis 10-30-2007  . Edema 09/12/13  . Fall 05/18/2017   tripped over wheel of walker; superior and inferior pubic ramus fractures   . Hypercholesterolemia   . Hypertension   . Hypertension, essential   . Hypothyroidism    as a teenager  . Indigestion   . Insomnia   . OA (osteoarthritis) of hip    right hip replacement, OA in bilateral knees  . Osteopenia    Actonel stopped in 2012 after 5years therapy  . Pneumonia 1939   "hospitalized"  . Rapid resting heart rate 50/93/2671   Uncertain cause. Possible ectopic atrial tachycardia. Date entered 12/25/12   . Right rotator cuff tear   . Seasonal allergies     Past Surgical History:  Procedure Laterality Date  . CATARACT EXTRACTION W/ INTRAOCULAR LENS  IMPLANT, BILATERAL Bilateral   . COLONOSCOPY  2009   Dr. Redmond [facility]  . DILATION AND CURETTAGE OF UTERUS    . FRACTURE SURGERY    . JOINT REPLACEMENT    . ORIF WRIST FRACTURE Right   . REDUCTION MAMMAPLASTY  1999  . TOTAL HIP ARTHROPLASTY  Right 03-04-08   Dr. Wynelle Link    Allergies  Allergen Reactions  . Amlodipine Besylate Other (See Comments)    Edema  . Lisinopril Cough    Cough   . Sulfamethoxazole-Trimethoprim Other (See Comments)    Unknown reaction     Outpatient Encounter Medications as of 09/28/2017  Medication Sig  . alendronate (FOSAMAX) 70 MG tablet Take 70 mg by mouth once a week. Take with a full glass of water on an empty stomach.  Marland Kitchen aspirin EC 81 MG tablet Take 81 mg by mouth daily.  Marland Kitchen atorvastatin (LIPITOR) 10 MG tablet TAKE 1 TABLET AT BEDTIME FOR CHOLESTEROL.    . Cholecalciferol (VITAMIN D PO) Take 1 tablet by mouth every 7 (seven) days. 50,000 units  . diclofenac sodium (VOLTAREN) 1 % GEL Apply 2 g topically every 6 (six) hours as needed.  . donepezil (ARICEPT) 5 MG tablet Take 1 tablet (5 mg total) by mouth at bedtime.  . famotidine (PEPCID) 20 MG tablet Take 20 mg by mouth daily.  Marland Kitchen losartan-hydrochlorothiazide (HYZAAR) 100-25 MG tablet TAKE 1 TABLET EACH DAY.  . metFORMIN (GLUCOPHAGE) 500 MG tablet TAKE (1/2) TABLET DAILY.  . metoprolol succinate (TOPROL-XL) 100 MG 24 hr tablet Take 150 mg by mouth 2 (two) times daily. Take with or immediately following a meal.   No facility-administered encounter medications on file as of 09/28/2017.     Review of Systems:  Review of Systems  Constitutional: Negative for chills, fever and malaise/fatigue.  HENT: Negative for congestion.   Eyes: Negative for blurred vision.       Glasses  Respiratory: Negative for cough and shortness of breath.        Less coughing complaint this visit  Cardiovascular: Positive for leg swelling. Negative for chest pain and palpitations.       Improves with elevation of feet  Gastrointestinal: Negative for abdominal pain, blood in stool, constipation and melena.  Genitourinary: Negative for dysuria.  Musculoskeletal: Positive for joint pain. Negative for falls.  Neurological: Negative for dizziness and loss of consciousness.  Psychiatric/Behavioral: Positive for memory loss. Negative for depression. The patient is not nervous/anxious and does not have insomnia.     Health Maintenance  Topic Date Due  . FOOT EXAM  09/02/1936  . OPHTHALMOLOGY EXAM  09/02/1936  . INFLUENZA VACCINE  08/11/2017  . HEMOGLOBIN A1C  12/16/2017  . TETANUS/TDAP  07/21/2027  . DEXA SCAN  Completed  . PNA vac Low Risk Adult  Completed    Physical Exam: Vitals:   09/28/17 0830  BP: 120/70  Pulse: (!) 102  Temp: 98.2 F (36.8 C)  TempSrc: Oral  SpO2: 95%  Weight: 176 lb (79.8 kg)   Height: 5\' 7"  (1.702 m)   Body mass index is 27.57 kg/m. Physical Exam  Constitutional: She is oriented to person, place, and time. She appears well-developed and well-nourished. No distress.  HENT:  Head: Normocephalic and atraumatic.  Cardiovascular: Intact distal pulses.  No murmur heard. Tachy, regular; mild nonpitting edema b/l ankles and feet  Pulmonary/Chest: Effort normal and breath sounds normal. No respiratory distress.  Abdominal: Soft. Bowel sounds are normal.  Musculoskeletal:  Ambulates more slowly than before with rollator walker  Neurological: She is alert and oriented to person, place, and time.  But poor historian at times  Skin: Skin is warm and dry. Capillary refill takes less than 2 seconds.  Psychiatric: She has a normal mood and affect.    Labs reviewed: Basic Metabolic Panel: Recent  Labs    05/18/17 1900 05/18/17 1918 05/19/17 0434 06/02/17 1111  NA 140 141 140 137  K 3.3* 3.3* 4.2 4.0  CL 103 103 106  --   CO2 26  --  27  --   GLUCOSE 108* 103* 132*  --   BUN 20 23* 22* 30*  CREATININE 1.05* 1.00 1.07* 0.9  CALCIUM 8.9  --  8.8*  --   MG 1.8  --   --   --    Liver Function Tests: Recent Labs    05/19/17 0434  ALBUMIN 3.4*   No results for input(s): LIPASE, AMYLASE in the last 8760 hours. No results for input(s): AMMONIA in the last 8760 hours. CBC: Recent Labs    05/18/17 1900  05/19/17 0434 05/20/17 0450 05/30/17 06/06/17 0300  WBC 13.9*  --  9.0 10.0 9.0 10.1  NEUTROABS 10.9*  --   --   --   --   --   HGB 13.5   < > 12.3 11.2* 11.6* 11.8*  HCT 42.6   < > 39.4 35.9* 35* 36  MCV 95.7  --  95.9 96.2  --   --   PLT 169  --  167 130* 283 315   < > = values in this interval not displayed.   Lipid Panel: Recent Labs    10/26/16 06/02/17 1111  CHOL 167 156  HDL 61 52  LDLCALC 79 79  TRIG 138 127   Lab Results  Component Value Date   HGBA1C 6.1 06/16/2017   Assessment/Plan 1. Short-term memory loss -getting gradually  worse probably for about a year and most notably since her pelvic fractures -for safety and considering availabilty of [facility], paperwork will be completed for DMV suggesting she no longer drive as requested by her son, but we do agree that it is an appropriate choice not to drive, certainly outside of the community -was started on aricept -might add namenda early if she will tolerate  2. Chronic diastolic heart failure (HCC) -cont metoprolol succinate, losartan hctz -still with sinus tach despite this, followed by cardiology  3. Essential hypertension -bp controlled with regimen above, no changes needed  4. Type 2 diabetes mellitus with stage 3 chronic kidney disease, without long-term current use of insulin (HCC) -mild diabetes with excellent and improved hba1c, cont metformin, arb, asa, statin  5. Closed fracture of multiple rami of right pubis with routine healing, subsequent encounter -is able to walk with rollator walker, but tolerates shorter distances than she did, response times slower  6. Driving safety issue -due to memory loss, decreased response times, slower movements, recommend she stop driving--DMV paperwork being completed and will be submitted by end of this month  Labs/tests ordered:  No new Next appt:  12/07/2017  Kharisma Glasner L. Cesia Orf, D.O. [facility] [address], [place] [address] Cell Phone (Mon-Fri 8am-5pm):  [phone] On Call:  [phone] & follow prompts after 5pm & weekends Office Phone:  [phone] Office Fax:  [phone]

## 2017-10-25 NOTE — Progress Notes (Signed)
Cardiology Office Note:    Date:  10/26/2017   ID:  Kelsey Salinas, DOB 15-Nov-1926, MRN [id number]  PCP:  Gayland Curry, DO  Cardiologist:  Sinclair Grooms, MD   Referring MD: Gayland Curry, DO   Chief Complaint  Patient presents with  . Congestive Heart Failure    Diastolic  . Irregular Heart Beat    Sinus tachycardia    History of Present Illness:    Kelsey Salinas is a 84 y.o. female with a hx of chronic diastolic heart failure, tachycardia, and hypertension.    She seems to be doing well.  She has no complaints.  She is annoyed by the fact that she had a fall earlier this year and her driver's license is being challenged.  She does not feel palpitations.  She denies shortness of breath and chest pain.  No significant lower extremity swelling.  She has not had syncope.  She denies orthopnea.     Past Medical History:  Diagnosis Date  . Childhood asthma   . Chronic diastolic heart failure [facility]) 12/25/2012   October 2013 echocardiogram 1. There is moderate concentric left ventricle hypertrophy. 2. Left ventricular ejection fraction estimated by 2D at 60-65 percent. 3. There were no regional wall motion abnormalities. 4. Mild mitral annular calcification. 5. Mild mitral valve regurgitation. 6. There is mild tricuspid regurgitation. 7. Mildly elevated estimated right ventricular systolic pressure. 8. Right ventricular systolic pressure estimated at 35-40 mm Hg. 9. Mild calcification of the aortic valve. 10. Trace aortic valve regurgitation. 11. Analysis of mitral valve inflow, pulmonary vein Doppler and tissue Doppler suggests grade I diastolic dysfunction without elevated left atrial pressure.   . Colon polyp   . Diverticular stricture ([place]) 2009  . Diverticulosis 10-30-2007  . Edema 09/12/13  . Fall 05/18/2017   tripped over wheel of walker; superior and inferior pubic ramus fractures   . Hypercholesterolemia   . Hypertension   . Hypertension, essential   .  Hypothyroidism    as a teenager  . Indigestion   . Insomnia   . OA (osteoarthritis) of hip    right hip replacement, OA in bilateral knees  . Osteopenia    Actonel stopped in 2012 after 5years therapy  . Pneumonia 1939   "hospitalized"  . Rapid resting heart rate 57/84/6962   Uncertain cause. Possible ectopic atrial tachycardia. Date entered 12/25/12   . Right rotator cuff tear   . Seasonal allergies     Past Surgical History:  Procedure Laterality Date  . CATARACT EXTRACTION W/ INTRAOCULAR LENS  IMPLANT, BILATERAL Bilateral   . COLONOSCOPY  2009   Dr. Redmond [employer]  . DILATION AND CURETTAGE OF UTERUS    . FRACTURE SURGERY    . JOINT REPLACEMENT    . ORIF WRIST FRACTURE Right   . REDUCTION MAMMAPLASTY  1999  . TOTAL HIP ARTHROPLASTY Right 03-04-08   Dr. Wynelle Link    Current Medications: Current Meds  Medication Sig  . alendronate (FOSAMAX) 70 MG tablet Take 70 mg by mouth once a week. Take with a full glass of water on an empty stomach.  Marland Kitchen aspirin EC 81 MG tablet Take 81 mg by mouth daily.  Marland Kitchen atorvastatin (LIPITOR) 10 MG tablet TAKE 1 TABLET AT BEDTIME FOR CHOLESTEROL.  . Cholecalciferol (VITAMIN D PO) Take 1 tablet by mouth every 7 (seven) days. 50,000 units  . diclofenac sodium (VOLTAREN) 1 % GEL Apply 2 g topically every 6 (six) hours as needed.  . donepezil (  ARICEPT) 5 MG tablet Take 1 tablet (5 mg total) by mouth at bedtime.  . famotidine (PEPCID) 20 MG tablet Take 20 mg by mouth daily.  Marland Kitchen losartan-hydrochlorothiazide (HYZAAR) 100-25 MG tablet TAKE 1 TABLET EACH DAY.  . metFORMIN (GLUCOPHAGE) 500 MG tablet TAKE (1/2) TABLET DAILY.  . metoprolol succinate (TOPROL-XL) 100 MG 24 hr tablet Take 150 mg by mouth 2 (two) times daily. Take with or immediately following a meal.     Allergies:   Amlodipine besylate; Lisinopril; and Sulfamethoxazole-trimethoprim   Social History   Socioeconomic History  . Marital status: Married    Spouse name: Not on file  . Number of children:  Not on file  . Years of education: Not on file  . Highest education level: Not on file  Occupational History  . Occupation: retired travel Hydrologist Needs  . Financial resource strain: Not hard at all  . Food insecurity:    Worry: Never true    Inability: Never true  . Transportation needs:    Medical: No    Non-medical: No  Tobacco Use  . Smoking status: Former Smoker    Packs/day: 0.12    Years: 50.00    Pack years: 6.00    Types: Cigarettes  . Smokeless tobacco: Never Used  . Tobacco comment: "stopped in the ate 1990s"  Substance and Sexual Activity  . Alcohol use: Yes    Comment: rarely  . Drug use: Never  . Sexual activity: Not Currently  Lifestyle  . Physical activity:    Days per week: 7 days    Minutes per session: 20 min  . Stress: To some extent  Relationships  . Social connections:    Talks on phone: More than three times a week    Gets together: More than three times a week    Attends religious service: More than 4 times per year    Active member of club or organization: Yes    Attends meetings of clubs or organizations: Never    Relationship status: Married  Other Topics Concern  . Not on file  Social History Narrative   Lives at [place] since 2010   Husband Ilona Sorrel married 1952 (husband in memory care unit at [employer])   Former smoker 1/2 PPD, for 30 years, stopped 01/10/1989   Alcohol 2 glasses of wine at night   Exercise: machines 3 times a week and daily walk   No POA/LW           Family History: The patient's family history includes Heart disease in her brother, father, and mother.  ROS:   Please see the history of present illness.    Had a fractured pelvis earlier this year.  She has not had significant pain.  She has to walk with the aid of a walker.  No neurological complaints.  Above, mentioned that her driver's license is being challenged.  All other systems reviewed and are negative.  EKGs/Labs/Other Studies Reviewed:     The following studies were reviewed today: No new data  EKG:  EKG is not ordered today.  The ekg May 18, 2017 demonstrated sinus tachycardia 103 bpm.  ECG also demonstrates low voltage.  Recent Labs: 05/18/2017: Magnesium 1.8 06/02/2017: BUN 30; Creatinine 0.9; Potassium 4.0; Sodium 137 06/06/2017: Hemoglobin 11.8; Platelets 315  Recent Lipid Panel    Component Value Date/Time   CHOL 156 06/02/2017 1111   TRIG 127 06/02/2017 1111   HDL 52 06/02/2017 1111   LDLCALC 79 06/02/2017  1111    Physical Exam:    VS:  BP 136/74   Pulse (!) 109   Ht 5\' 8"  (1.727 m)   Wt 177 lb 6.4 oz (80.5 kg)   BMI 26.97 kg/m     Wt Readings from Last 3 Encounters:  10/26/17 177 lb 6.4 oz (80.5 kg)  10/26/17 175 lb (79.4 kg)  09/28/17 176 lb (79.8 kg)     GEN:  Well nourished, well developed in no acute distress HEENT: Normal NECK: No JVD. LYMPHATICS: No lymphadenopathy CARDIAC: RRR, no murmur, no gallop, no edema. VASCULAR: 2+ bilateral radial and carotid pulses.  No bruits. RESPIRATORY:  Clear to auscultation without rales, wheezing or rhonchi  ABDOMEN: Soft, non-tender, non-distended, No pulsatile mass, MUSCULOSKELETAL: No deformity  SKIN: Warm and dry NEUROLOGIC:  Alert and oriented x 3 PSYCHIATRIC:  Normal affect   ASSESSMENT:    1. Rapid resting heart rate   2. Chronic diastolic heart failure ([place])   3. Type 2 diabetes mellitus with stage 3 chronic kidney disease, without long-term current use of insulin ([place])   4. Hypercholesterolemia   5. Essential hypertension    PLAN:    In order of problems listed above:  1. Heart rate is still elevated.  It is a atrial rhythm.  She is on 100 mg of metoprolol daily and this seems to slow it somewhat.  She is not having symptoms so no changes at this time. 2. She denies dyspnea and there is no evidence of volume overload. 3. Not evaluated. 4. Not discussed. 5. Blood pressure control is adequate for her age.  Plan to continue current  therapy.  Clinical follow-up in 1 year.  Earlier if increased heart rate beyond current level.   Medication Adjustments/Labs and Tests Ordered: Current medicines are reviewed at length with the patient today.  Concerns regarding medicines are outlined above.  No orders of the defined types were placed in this encounter.  No orders of the defined types were placed in this encounter.   Patient Instructions  Your physician recommends that you continue on your current medications as directed. Please refer to the Current Medication list given to you today.   Your physician wants you to follow-up in: [place] will receive a reminder letter in the mail two months in advance. If you don't receive a letter, please call our office to schedule the follow-up appointment.     Signed, Sinclair Grooms, MD  10/26/2017 4:20 PM    [facility]

## 2017-10-26 ENCOUNTER — Encounter: Payer: Self-pay | Admitting: Interventional Cardiology

## 2017-10-26 ENCOUNTER — Encounter

## 2017-10-26 ENCOUNTER — Encounter: Payer: Self-pay | Admitting: Internal Medicine

## 2017-10-26 ENCOUNTER — Non-Acute Institutional Stay: Payer: Medicare Other | Admitting: Internal Medicine

## 2017-10-26 ENCOUNTER — Ambulatory Visit (INDEPENDENT_AMBULATORY_CARE_PROVIDER_SITE_OTHER): Payer: Medicare Other | Admitting: Interventional Cardiology

## 2017-10-26 VITALS — BP 136/74 | HR 109 | Ht 68.0 in | Wt 177.4 lb

## 2017-10-26 VITALS — BP 128/70 | HR 106 | Temp 97.9°F | Ht 68.0 in | Wt 175.0 lb

## 2017-10-26 DIAGNOSIS — I5032 Chronic diastolic (congestive) heart failure: Secondary | ICD-10-CM | POA: Diagnosis not present

## 2017-10-26 DIAGNOSIS — R413 Other amnesia: Secondary | ICD-10-CM

## 2017-10-26 DIAGNOSIS — N183 Chronic kidney disease, stage 3 unspecified: Secondary | ICD-10-CM

## 2017-10-26 DIAGNOSIS — I10 Essential (primary) hypertension: Secondary | ICD-10-CM

## 2017-10-26 DIAGNOSIS — E1122 Type 2 diabetes mellitus with diabetic chronic kidney disease: Secondary | ICD-10-CM

## 2017-10-26 DIAGNOSIS — E7800 Pure hypercholesterolemia, unspecified: Secondary | ICD-10-CM | POA: Diagnosis not present

## 2017-10-26 DIAGNOSIS — Z9189 Other specified personal risk factors, not elsewhere classified: Secondary | ICD-10-CM | POA: Diagnosis not present

## 2017-10-26 DIAGNOSIS — H5340 Unspecified visual field defects: Secondary | ICD-10-CM

## 2017-10-26 DIAGNOSIS — R000 Tachycardia, unspecified: Secondary | ICD-10-CM | POA: Diagnosis not present

## 2017-10-26 NOTE — Patient Instructions (Signed)
Your physician recommends that you continue on your current medications as directed. Please refer to the Current Medication list given to you today.   Your physician wants you to follow-up in: [place] will receive a reminder letter in the mail two months in advance. If you don't receive a letter, please call our office to schedule the follow-up appointment.

## 2017-10-26 NOTE — Progress Notes (Signed)
Location:  [facility]   Place of Service:   clinic  Provider: Arabella Revelle L. Mariea Clonts, D.O., C.M.D.  Code Status: full code Goals of Care:  Advanced Directives 08/03/2017  Does Patient Have a Medical Advance Directive? Yes  Type of Paramedic of [place];Living will  Does patient want to make changes to medical advance directive? No - Patient declined  Copy of [place] in Chart? Yes  Would patient like information on creating a medical advance directive? -  Pre-existing out of facility DNR order (yellow form or pink MOST form) -     Chief Complaint  Patient presents with  . Acute Visit    discuss driving letters    HPI: Patient is a 84 y.o. female seen today for an acute visit for discussion of letter she received revoking her driving privileges. Says she never had an accident and is a good driver.   She wants to request reconsideration about her driving.   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  She requests a letter indicating her medical conditions are under control--this is true (except I cannot confirm anything about her visual field impairment as noted by her ophthalmologist), but that does not make her necessarily safe to drive.  I asked if she'd spoken to her children about her driving (b/c they've had concerns), but she says they both told her she's ok to drive which is not true.  She is very frustrated and upset that her driving privilege has been cancelled as of 10/29/17.  The letter does not include the cognitive decline which has been the primary concern we have actually had.    Past Medical History:  Diagnosis Date  . Childhood asthma   . Chronic diastolic heart failure [facility]) 12/25/2012   October 2013 echocardiogram 1. There is moderate concentric left ventricle hypertrophy. 2. Left ventricular ejection fraction estimated by 2D at 60-65  percent. 3. There were no regional wall motion abnormalities. 4. Mild mitral annular calcification. 5. Mild mitral valve regurgitation. 6. There is mild tricuspid regurgitation. 7. Mildly elevated estimated right ventricular systolic pressure. 8. Right ventricular systolic pressure estimated at 35-40 mm Hg. 9. Mild calcification of the aortic valve. 10. Trace aortic valve regurgitation. 11. Analysis of mitral valve inflow, pulmonary vein Doppler and tissue Doppler suggests grade I diastolic dysfunction without elevated left atrial pressure.   . Colon polyp   . Diverticular stricture ([place]) 2009  . Diverticulosis 10-30-2007  . Edema 09/12/13  . Fall 05/18/2017   tripped over wheel of walker; superior and inferior pubic ramus fractures   . Hypercholesterolemia   . Hypertension   . Hypertension, essential   . Hypothyroidism    as a teenager  . Indigestion   . Insomnia   . OA (osteoarthritis) of hip    right hip replacement, OA in bilateral knees  . Osteopenia    Actonel stopped in 2012 after 5years therapy  . Pneumonia 1939   "hospitalized"  . Rapid resting heart rate 16/10/9602   Uncertain cause. Possible ectopic atrial tachycardia. Date entered 12/25/12   . Right rotator cuff tear   . Seasonal allergies     Past Surgical History:  Procedure Laterality Date  . CATARACT EXTRACTION W/ INTRAOCULAR LENS  IMPLANT, BILATERAL Bilateral   . COLONOSCOPY  2009   Dr. Redmond School  . DILATION AND CURETTAGE OF UTERUS    .  FRACTURE SURGERY    . JOINT REPLACEMENT    . ORIF WRIST FRACTURE Right   . REDUCTION MAMMAPLASTY  1999  . TOTAL HIP ARTHROPLASTY Right 03-04-08   Dr. Wynelle Link    Allergies  Allergen Reactions  . Amlodipine Besylate Other (See Comments)    Edema  . Lisinopril Cough    Cough   . Sulfamethoxazole-Trimethoprim Other (See Comments)    Unknown reaction     Outpatient Encounter Medications as of 10/26/2017  Medication Sig  . alendronate (FOSAMAX) 70 MG tablet Take 70 mg by  mouth once a week. Take with a full glass of water on an empty stomach.  Marland Kitchen aspirin EC 81 MG tablet Take 81 mg by mouth daily.  Marland Kitchen atorvastatin (LIPITOR) 10 MG tablet TAKE 1 TABLET AT BEDTIME FOR CHOLESTEROL.  . Cholecalciferol (VITAMIN D PO) Take 1 tablet by mouth every 7 (seven) days. 50,000 units  . diclofenac sodium (VOLTAREN) 1 % GEL Apply 2 g topically every 6 (six) hours as needed.  . donepezil (ARICEPT) 5 MG tablet Take 1 tablet (5 mg total) by mouth at bedtime.  . famotidine (PEPCID) 20 MG tablet Take 20 mg by mouth daily.  Marland Kitchen losartan-hydrochlorothiazide (HYZAAR) 100-25 MG tablet TAKE 1 TABLET EACH DAY.  . metFORMIN (GLUCOPHAGE) 500 MG tablet TAKE (1/2) TABLET DAILY.  . metoprolol succinate (TOPROL-XL) 100 MG 24 hr tablet Take 150 mg by mouth 2 (two) times daily. Take with or immediately following a meal.   No facility-administered encounter medications on file as of 10/26/2017.     Review of Systems:  Review of Systems  Constitutional: Negative for chills and fever.  HENT: Negative for congestion.   Eyes: Negative for blurred vision.       Visual field impairment per ophtho  Respiratory: Negative for cough and shortness of breath.   Cardiovascular: Positive for leg swelling. Negative for chest pain and palpitations.  Gastrointestinal: Negative for abdominal pain.  Genitourinary: Negative for dysuria.  Musculoskeletal: Positive for joint pain, myalgias and neck pain. Negative for falls.  Skin: Negative for rash.  Neurological: Negative for dizziness and loss of consciousness.  Endo/Heme/Allergies:       Diabetes  Psychiatric/Behavioral: Positive for memory loss. Negative for depression. The patient is not nervous/anxious and does not have insomnia.     Health Maintenance  Topic Date Due  . FOOT EXAM  09/02/1936  . OPHTHALMOLOGY EXAM  09/02/1936  . INFLUENZA VACCINE  08/11/2017  . HEMOGLOBIN A1C  12/16/2017  . TETANUS/TDAP  07/21/2027  . DEXA SCAN  Completed  . PNA vac  Low Risk Adult  Completed    Physical Exam: Vitals:   10/26/17 0855  BP: 128/70  Pulse: (!) 106  Temp: 97.9 F (36.6 C)  TempSrc: Oral  SpO2: 94%  Weight: 175 lb (79.4 kg)  Height: 5\' 8"  (1.727 m)   Body mass index is 26.61 kg/m. Physical Exam  Constitutional: She is oriented to person, place, and time. She appears well-developed and well-nourished. No distress.  HENT:  Head: Normocephalic and atraumatic.  Cardiovascular:  Tachy, regular  Pulmonary/Chest: Effort normal and breath sounds normal.  Musculoskeletal:  Ambulates with rollator walker  Neurological: She is alert and oriented to person, place, and time.  Skin: Skin is warm and dry.  Psychiatric:  Very agitated and upset, HR way up, insists she is safe to drive despite family concerns and mine and APP's     Labs reviewed: Basic Metabolic Panel: Recent Labs    05/18/17  1900 05/18/17 1918 05/19/17 0434 06/02/17 1111  NA 140 141 140 137  K 3.3* 3.3* 4.2 4.0  CL 103 103 106  --   CO2 26  --  27  --   GLUCOSE 108* 103* 132*  --   BUN 20 23* 22* 30*  CREATININE 1.05* 1.00 1.07* 0.9  CALCIUM 8.9  --  8.8*  --   MG 1.8  --   --   --    Liver Function Tests: Recent Labs    05/19/17 0434  ALBUMIN 3.4*   No results for input(s): LIPASE, AMYLASE in the last 8760 hours. No results for input(s): AMMONIA in the last 8760 hours. CBC: Recent Labs    05/18/17 1900  05/19/17 0434 05/20/17 0450 05/30/17 06/06/17 0300  WBC 13.9*  --  9.0 10.0 9.0 10.1  NEUTROABS 10.9*  --   --   --   --   --   HGB 13.5   < > 12.3 11.2* 11.6* 11.8*  HCT 42.6   < > 39.4 35.9* 35* 36  MCV 95.7  --  95.9 96.2  --   --   PLT 169  --  167 130* 283 315   < > = values in this interval not displayed.   Lipid Panel: Recent Labs    06/02/17 1111  CHOL 156  HDL 52  LDLCALC 79  TRIG 127   Lab Results  Component Value Date   HGBA1C 6.1 06/16/2017    Assessment/Plan 1. Short-term memory loss -concerning with desire to  continue driving -adamant she is safe to drive despite this and seems unaware of the memory concerns  2. Chronic diastolic heart failure (HCC) -stable at this time  3. Essential hypertension -stable at this time and controlled  4. Visual field defect -unable to comment and would definitely be of concern with driving--would need ophtho input  5. Rapid resting heart rate -ongoing issue that is asymptomatic  6. Type 2 diabetes mellitus with stage 3 chronic kidney disease, without long-term current use of insulin (HCC) -well controlled with medication  7. Driving safety issue -due to cognitive losses and ambulatory difficulties delaying response times--discussed these concerns with her --insists upon having a letter indicating her conditions are stable -will send her reconsideration letter with a note saying that her htn, cardiac arrhythmias and diabetes are stable, but I cannot attest to her visual fields -MSK impairments and cognition remain concerning. -family requested her driving privileges be revoked for safety concerns  Labs/tests ordered:  No new Next appt:  12/07/2017  Mattia Liford L. Chanoch Mccleery, D.O. [facility] [address], [place] [address] Cell Phone (Mon-Fri 8am-5pm):  [phone] On Call:  [phone] & follow prompts after 5pm & weekends Office Phone:  [phone] Office Fax:  [phone]

## 2017-11-03 DIAGNOSIS — H35033 Hypertensive retinopathy, bilateral: Secondary | ICD-10-CM | POA: Diagnosis not present

## 2017-11-03 DIAGNOSIS — H40013 Open angle with borderline findings, low risk, bilateral: Secondary | ICD-10-CM | POA: Diagnosis not present

## 2017-11-03 DIAGNOSIS — Z961 Presence of intraocular lens: Secondary | ICD-10-CM | POA: Diagnosis not present

## 2017-11-03 DIAGNOSIS — H04123 Dry eye syndrome of bilateral lacrimal glands: Secondary | ICD-10-CM | POA: Diagnosis not present

## 2017-11-03 DIAGNOSIS — H524 Presbyopia: Secondary | ICD-10-CM | POA: Diagnosis not present

## 2017-11-03 LAB — HM DIABETES EYE EXAM

## 2017-11-07 ENCOUNTER — Other Ambulatory Visit: Payer: Self-pay | Admitting: Interventional Cardiology

## 2017-11-17 ENCOUNTER — Ambulatory Visit: Payer: Medicare Other | Admitting: Internal Medicine

## 2017-11-18 ENCOUNTER — Telehealth: Payer: Self-pay | Admitting: *Deleted

## 2017-11-18 DIAGNOSIS — Z23 Encounter for immunization: Secondary | ICD-10-CM | POA: Diagnosis not present

## 2017-11-18 NOTE — Telephone Encounter (Signed)
.  left message to have patient return my call.  

## 2017-11-18 NOTE — Telephone Encounter (Signed)
Spoke with patient and advised results   

## 2017-11-18 NOTE — Telephone Encounter (Signed)
-----   Message from Gayland Curry, DO sent at 11/18/2017 10:36 AM EST ----- Regarding: RE: driving  Dee, please call her and tell her that once the [facility] decision is made, it's out of our hands.  ----- Message ----- From: Despina Hidden, CMA Sent: 11/18/2017   8:02 AM EST To: Gayland Curry, DO, Royal Hawthorn, NP Subject: RE: driving                                    She canceled her appt, should I call her and tell her this?  ----- Message ----- From: Royal Hawthorn, NP Sent: 11/17/2017   8:53 AM EST To: Gayland Curry, DO, Despina Hidden, CMA Subject: driving                                        There was a letter left on the desk this morning for me from Ms. Gambill requesting that I write a letter to help reverse the decision made by the [facility]. Karena Addison, is she coming in today for an apt to discuss this? If so could you let her know that I can not go beyond what Dr. Mariea Clonts has already done?

## 2017-11-22 ENCOUNTER — Telehealth: Payer: Self-pay | Admitting: *Deleted

## 2017-11-22 ENCOUNTER — Other Ambulatory Visit: Payer: Self-pay | Admitting: Internal Medicine

## 2017-11-22 NOTE — Telephone Encounter (Signed)
Estill Bamberg notified and agreed.

## 2017-11-22 NOTE — Telephone Encounter (Signed)
Estill Bamberg with [facility] called and wanted to know if they can split patient's Hyzaar up into 2 different Rx's due to Hyzaar backorder. Please Advise.

## 2017-11-22 NOTE — Telephone Encounter (Signed)
Fine with me

## 2017-11-24 DIAGNOSIS — M170 Bilateral primary osteoarthritis of knee: Secondary | ICD-10-CM | POA: Diagnosis not present

## 2017-11-24 DIAGNOSIS — Z96641 Presence of right artificial hip joint: Secondary | ICD-10-CM | POA: Diagnosis not present

## 2017-11-24 DIAGNOSIS — Z471 Aftercare following joint replacement surgery: Secondary | ICD-10-CM | POA: Diagnosis not present

## 2017-11-30 ENCOUNTER — Other Ambulatory Visit: Payer: Self-pay | Admitting: Internal Medicine

## 2017-12-01 ENCOUNTER — Encounter: Payer: Self-pay | Admitting: *Deleted

## 2017-12-07 ENCOUNTER — Encounter

## 2017-12-07 ENCOUNTER — Encounter: Payer: Self-pay | Admitting: Internal Medicine

## 2017-12-07 ENCOUNTER — Non-Acute Institutional Stay: Payer: Medicare Other | Admitting: Internal Medicine

## 2017-12-07 VITALS — BP 128/80 | HR 111 | Temp 98.0°F | Ht 68.0 in | Wt 176.0 lb

## 2017-12-07 DIAGNOSIS — G6289 Other specified polyneuropathies: Secondary | ICD-10-CM | POA: Diagnosis not present

## 2017-12-07 DIAGNOSIS — N183 Chronic kidney disease, stage 3 unspecified: Secondary | ICD-10-CM

## 2017-12-07 DIAGNOSIS — E663 Overweight: Secondary | ICD-10-CM | POA: Diagnosis not present

## 2017-12-07 DIAGNOSIS — E7800 Pure hypercholesterolemia, unspecified: Secondary | ICD-10-CM | POA: Diagnosis not present

## 2017-12-07 DIAGNOSIS — J301 Allergic rhinitis due to pollen: Secondary | ICD-10-CM | POA: Diagnosis not present

## 2017-12-07 DIAGNOSIS — R739 Hyperglycemia, unspecified: Secondary | ICD-10-CM | POA: Diagnosis not present

## 2017-12-07 DIAGNOSIS — I10 Essential (primary) hypertension: Secondary | ICD-10-CM | POA: Diagnosis not present

## 2017-12-07 DIAGNOSIS — D649 Anemia, unspecified: Secondary | ICD-10-CM | POA: Diagnosis not present

## 2017-12-07 DIAGNOSIS — G8929 Other chronic pain: Secondary | ICD-10-CM

## 2017-12-07 DIAGNOSIS — M542 Cervicalgia: Secondary | ICD-10-CM

## 2017-12-07 DIAGNOSIS — E119 Type 2 diabetes mellitus without complications: Secondary | ICD-10-CM | POA: Diagnosis not present

## 2017-12-07 DIAGNOSIS — E1122 Type 2 diabetes mellitus with diabetic chronic kidney disease: Secondary | ICD-10-CM | POA: Diagnosis not present

## 2017-12-07 DIAGNOSIS — Z6826 Body mass index (BMI) 26.0-26.9, adult: Secondary | ICD-10-CM | POA: Diagnosis not present

## 2017-12-07 DIAGNOSIS — D519 Vitamin B12 deficiency anemia, unspecified: Secondary | ICD-10-CM | POA: Diagnosis not present

## 2017-12-07 MED ORDER — CETIRIZINE HCL 10 MG PO TABS: 10.0000 mg | ORAL_TABLET | Freq: Every day | ORAL | 11 refills | Status: DC

## 2017-12-07 NOTE — Progress Notes (Signed)
Location:  Occupational psychologist of Service:  Clinic (12)  Provider: Albie Arizpe L. Mariea Clonts, D.O., C.M.D.  Code Status: DNR Goals of Care:  Advanced Directives 08/03/2017  Does Patient Have a Medical Advance Directive? Yes  Type of Paramedic of [place];Living will  Does patient want to make changes to medical advance directive? No - Patient declined  Copy of [place] in Chart? Yes  Would patient like information on creating a medical advance directive? -  Pre-existing out of facility DNR order (yellow form or pink MOST form) -   Chief Complaint  Patient presents with  . Medical Management of Chronic Issues    75mth follow-up    HPI: Patient is a 84 y.o. female seen today for medical management of chronic diseases.   Says her nose has been running for 6 months and it must be an allergy.  She is keeping kleenex in business.  Her stiffness in the back of her neck continues.  She gets a treatment with [facility] weekly which gets better while she's there.  Has to adjust her pillows to get to sleep.  She puts heat on it which helps.  Wants to wait until next week to get xray of neck I suggested to her.  Foot exam:   Has no numbness in her feet.    Eye appt:  Just had eye exam--Dr. Hecker's office.  All was good.    Saw Dr. Gerilyn Nestle are not really better or worse.  Does not want surgery.  He gave her shots three weeks in a row and they did not help.  She puts ice on at night.  There are times when they feel weak and they don't support her well.  Discussed using her voltaren.    Past Medical History:  Diagnosis Date  . Childhood asthma   . Chronic diastolic heart failure [facility]) 12/25/2012   October 2013 echocardiogram 1. There is moderate concentric left ventricle hypertrophy. 2. Left ventricular ejection fraction estimated by 2D at 60-65 percent. 3. There were no regional wall motion abnormalities. 4. Mild mitral annular  calcification. 5. Mild mitral valve regurgitation. 6. There is mild tricuspid regurgitation. 7. Mildly elevated estimated right ventricular systolic pressure. 8. Right ventricular systolic pressure estimated at 35-40 mm Hg. 9. Mild calcification of the aortic valve. 10. Trace aortic valve regurgitation. 11. Analysis of mitral valve inflow, pulmonary vein Doppler and tissue Doppler suggests grade I diastolic dysfunction without elevated left atrial pressure.   . Colon polyp   . Diverticular stricture ([place]) 2009  . Diverticulosis 10-30-2007  . Edema 09/12/13  . Fall 05/18/2017   tripped over wheel of walker; superior and inferior pubic ramus fractures   . Hypercholesterolemia   . Hypertension   . Hypertension, essential   . Hypothyroidism    as a teenager  . Indigestion   . Insomnia   . OA (osteoarthritis) of hip    right hip replacement, OA in bilateral knees  . Osteopenia    Actonel stopped in 2012 after 5years therapy  . Pneumonia 1939   "hospitalized"  . Rapid resting heart rate 00/86/7619   Uncertain cause. Possible ectopic atrial tachycardia. Date entered 12/25/12   . Right rotator cuff tear   . Seasonal allergies     Past Surgical History:  Procedure Laterality Date  . CATARACT EXTRACTION W/ INTRAOCULAR LENS  IMPLANT, BILATERAL Bilateral   . COLONOSCOPY  2009   Dr. Redmond School  . DILATION AND  CURETTAGE OF UTERUS    . FRACTURE SURGERY    . JOINT REPLACEMENT    . ORIF WRIST FRACTURE Right   . REDUCTION MAMMAPLASTY  1999  . TOTAL HIP ARTHROPLASTY Right 03-04-08   Dr. Wynelle Link    Allergies  Allergen Reactions  . Amlodipine Besylate Other (See Comments)    Edema  . Lisinopril Cough    Cough   . Sulfamethoxazole-Trimethoprim Other (See Comments)    Unknown reaction     Outpatient Encounter Medications as of 12/07/2017  Medication Sig  . alendronate (FOSAMAX) 70 MG tablet Take 70 mg by mouth once a week. Take with a full glass of water on an empty stomach.  Marland Kitchen aspirin EC  81 MG tablet Take 81 mg by mouth daily.  Marland Kitchen atorvastatin (LIPITOR) 10 MG tablet TAKE 1 TABLET AT BEDTIME FOR CHOLESTEROL.  . Cholecalciferol (VITAMIN D PO) Take 1 tablet by mouth every 7 (seven) days. 50,000 units  . diclofenac sodium (VOLTAREN) 1 % GEL Apply 2 g topically every 6 (six) hours as needed.  . donepezil (ARICEPT) 5 MG tablet TAKE ONE TABLET AT BEDTIME.  . famotidine (PEPCID) 20 MG tablet Take 20 mg by mouth daily.  Marland Kitchen losartan-hydrochlorothiazide (HYZAAR) 100-25 MG tablet TAKE 1 TABLET EACH DAY.  . metFORMIN (GLUCOPHAGE) 500 MG tablet TAKE (1/2) TABLET DAILY.  . metoprolol succinate (TOPROL-XL) 100 MG 24 hr tablet Take 1 1/2 (150 mg total) tablets by mouth twice daily.   No facility-administered encounter medications on file as of 12/07/2017.     Review of Systems:  Review of Systems  Constitutional: Negative for chills, fever and malaise/fatigue.  HENT: Negative for congestion.   Eyes: Negative for blurred vision.       Glasses  Respiratory: Negative for shortness of breath.   Cardiovascular: Negative for chest pain, palpitations and leg swelling.  Gastrointestinal: Negative for abdominal pain, constipation and diarrhea.  Genitourinary: Negative for dysuria.  Musculoskeletal: Negative for joint pain.  Neurological: Positive for tingling and sensory change. Negative for dizziness and loss of consciousness.  Psychiatric/Behavioral: Positive for memory loss. Negative for depression. The patient is not nervous/anxious and does not have insomnia.     Health Maintenance  Topic Date Due  . FOOT EXAM  09/02/1936  . OPHTHALMOLOGY EXAM  09/02/1936  . HEMOGLOBIN A1C  12/16/2017  . TETANUS/TDAP  07/21/2027  . INFLUENZA VACCINE  Completed  . DEXA SCAN  Completed  . PNA vac Low Risk Adult  Completed    Physical Exam: Vitals:   12/07/17 1353  BP: 128/80  Pulse: (!) 111  Temp: 98 F (36.7 C)  TempSrc: Oral  SpO2: 97%  Weight: 176 lb (79.8 kg)  Height: 5\' 8"  (1.727 m)    Body mass index is 26.76 kg/m. Physical Exam  Labs reviewed: Basic Metabolic Panel: Recent Labs    05/18/17 1900 05/18/17 1918 05/19/17 0434 06/02/17 1111  NA 140 141 140 137  K 3.3* 3.3* 4.2 4.0  CL 103 103 106  --   CO2 26  --  27  --   GLUCOSE 108* 103* 132*  --   BUN 20 23* 22* 30*  CREATININE 1.05* 1.00 1.07* 0.9  CALCIUM 8.9  --  8.8*  --   MG 1.8  --   --   --    Liver Function Tests: Recent Labs    05/19/17 0434  ALBUMIN 3.4*   No results for input(s): LIPASE, AMYLASE in the last 8760 hours. No results for input(s):  AMMONIA in the last 8760 hours. CBC: Recent Labs    05/18/17 1900  05/19/17 0434 05/20/17 0450 05/30/17 06/06/17 0300  WBC 13.9*  --  9.0 10.0 9.0 10.1  NEUTROABS 10.9*  --   --   --   --   --   HGB 13.5   < > 12.3 11.2* 11.6* 11.8*  HCT 42.6   < > 39.4 35.9* 35* 36  MCV 95.7  --  95.9 96.2  --   --   PLT 169  --  167 130* 283 315   < > = values in this interval not displayed.   Lipid Panel: Recent Labs    06/02/17 1111  CHOL 156  HDL 52  LDLCALC 79  TRIG 127   Lab Results  Component Value Date   HGBA1C 6.1 06/16/2017    Procedures since last visit: No results found.  Assessment/Plan 1. Overweight (BMI 25.0-29.9) -cont improved dietary choices and walking to visit [facility]  2. BMI 26.0-26.9,adult -ongoing, but weight had been up in obese range--has improved diet and lost weight since diabetes dx  3. Non-seasonal allergic rhinitis due to pollen - requests medication to treat her rhinitis--had discontinued when in rehab but is now c/o this again  - cetirizine (ZYRTEC) 10 MG tablet; Take 1 tablet (10 mg total) by mouth at bedtime.  Dispense: 30 tablet; Refill: 11  4. Neck pain, chronic - ongoing despite OT for conservative mgt - will check xrays to evaluate severity of arthritis in her neck--?osteophytes or is this all purely muscular - DG Cervical Spine Complete; Future ordered at [facility] imaging--explained walk-in  with [facility] or caregiver transportation  5. Type 2 diabetes mellitus with stage 3 chronic kidney disease, without long-term current use of insulin (HCC) -had eye exam within the year at Dr. Payton Emerald office -foot exam done today -hba1c now well controlled -some neuropathy noted today likely due to DM  6. Other polyneuropathy -r/o b12 or folate deficiency but suspect due to diabetes -also mentions patches on bilateral lower legs that are sometimes numb  Labs/tests ordered:  Add b12 and folate to labs to be done next week (did not get yesterday b/c she did not want to wait for phlebotomist to arrive) Next appt:  4 mos med [place]. Ravindra Baranek, D.O. [facility] [address], [place] [address] Cell Phone (Mon-Fri 8am-5pm):  [phone] On Call:  [phone] & follow prompts after 5pm & weekends Office Phone:  [phone] Office Fax:  [phone]

## 2017-12-13 LAB — HEPATIC FUNCTION PANEL
ALT: 12 (ref 7–35)
AST: 16 (ref 13–35)
Alkaline Phosphatase: 79 (ref 25–125)
Bilirubin, Total: 0.4

## 2017-12-13 LAB — BASIC METABOLIC PANEL
BUN: 26 — AB (ref 4–21)
Creatinine: 1.1 (ref 0.5–1.1)
Glucose: 120
Potassium: 4 (ref 3.4–5.3)
Sodium: 140 (ref 137–147)

## 2017-12-13 LAB — VITAMIN B12: Vitamin B-12: 165

## 2017-12-13 LAB — CBC AND DIFFERENTIAL
HCT: 43 (ref 36–46)
Hemoglobin: 14.1 (ref 12.0–16.0)
Platelets: 175 (ref 150–399)
WBC: 6.3

## 2017-12-13 LAB — HEMOGLOBIN A1C: Hemoglobin A1C: 6.7

## 2017-12-15 ENCOUNTER — Ambulatory Visit
Admission: RE | Admit: 2017-12-15 | Discharge: 2017-12-15 | Disposition: A | Discharge status: Home / Self Care | Payer: Medicare Other | Source: Ambulatory Visit | Attending: Internal Medicine | Admitting: Internal Medicine

## 2017-12-15 ENCOUNTER — Encounter: Payer: Self-pay | Admitting: Internal Medicine

## 2017-12-15 DIAGNOSIS — M50322 Other cervical disc degeneration at C5-C6 level: Secondary | ICD-10-CM | POA: Diagnosis not present

## 2017-12-15 DIAGNOSIS — M542 Cervicalgia: Principal | ICD-10-CM

## 2017-12-15 DIAGNOSIS — M50323 Other cervical disc degeneration at C6-C7 level: Secondary | ICD-10-CM | POA: Diagnosis not present

## 2017-12-15 DIAGNOSIS — G8929 Other chronic pain: Secondary | ICD-10-CM

## 2017-12-16 ENCOUNTER — Other Ambulatory Visit: Payer: Self-pay | Admitting: *Deleted

## 2017-12-16 MED ORDER — VITAMIN B-12 1000 MCG PO TABS: 1000.0000 ug | ORAL_TABLET | Freq: Every day | ORAL | 1 refills | Status: AC

## 2017-12-19 ENCOUNTER — Other Ambulatory Visit: Payer: Self-pay | Admitting: Internal Medicine

## 2017-12-19 DIAGNOSIS — M5030 Other cervical disc degeneration, unspecified cervical region: Secondary | ICD-10-CM

## 2017-12-19 DIAGNOSIS — M542 Cervicalgia: Secondary | ICD-10-CM

## 2017-12-19 DIAGNOSIS — G8929 Other chronic pain: Secondary | ICD-10-CM

## 2017-12-19 NOTE — Progress Notes (Signed)
Order placed for MRI of neck to be done in am once authorized and scheduled.

## 2017-12-28 ENCOUNTER — Other Ambulatory Visit: Payer: Self-pay | Admitting: Internal Medicine

## 2017-12-28 ENCOUNTER — Telehealth: Payer: Self-pay | Admitting: *Deleted

## 2017-12-28 DIAGNOSIS — J301 Allergic rhinitis due to pollen: Secondary | ICD-10-CM

## 2017-12-28 MED ORDER — HYDROCODONE-CHLORPHENIRAMINE 5-4 MG/5ML PO SOLN: 5.0000 mL | Freq: Every evening | ORAL | 0 refills | Status: DC | PRN

## 2017-12-28 NOTE — Telephone Encounter (Signed)
Patient called and stated that she would like Hydrocodone Chlorphen ER liquid reinstated. Stated that she had an old Rx from last year and she took a teaspoon and it stopped her cough. Stated that the OTC medication you told her to take is not working. Stated that the Hydrocodone Chlorphen ER is out of date but she took it anyway and it helped and requesting a Rx be sent to [facility]. Please Advise.

## 2017-12-28 NOTE — Progress Notes (Signed)
I sent it in.  Due to its addictive potential and risk for falls and confusion, she should use it only at night and be very careful if she has to get up and walk around after using it.  It should not be refilled.

## 2017-12-28 NOTE — Telephone Encounter (Signed)
I ordered it for her.  It's a one time order.  She should not be on this long term and should take it only at bedtime.

## 2017-12-31 ENCOUNTER — Other Ambulatory Visit: Payer: Medicare Other

## 2018-01-06 NOTE — Telephone Encounter (Signed)
Called patient regarding her results of her xray.

## 2018-01-17 ENCOUNTER — Ambulatory Visit
Admission: RE | Admit: 2018-01-17 | Discharge: 2018-01-17 | Disposition: A | Discharge status: Home / Self Care | Payer: Medicare Other | Source: Ambulatory Visit | Attending: Internal Medicine | Admitting: Internal Medicine

## 2018-01-17 DIAGNOSIS — G8929 Other chronic pain: Secondary | ICD-10-CM

## 2018-01-17 DIAGNOSIS — M542 Cervicalgia: Principal | ICD-10-CM

## 2018-01-17 DIAGNOSIS — M47816 Spondylosis without myelopathy or radiculopathy, lumbar region: Secondary | ICD-10-CM | POA: Diagnosis not present

## 2018-01-17 DIAGNOSIS — M5030 Other cervical disc degeneration, unspecified cervical region: Secondary | ICD-10-CM

## 2018-01-17 DIAGNOSIS — M5021 Other cervical disc displacement,  high cervical region: Secondary | ICD-10-CM | POA: Diagnosis not present

## 2018-01-17 DIAGNOSIS — M4802 Spinal stenosis, cervical region: Secondary | ICD-10-CM | POA: Diagnosis not present

## 2018-01-20 ENCOUNTER — Other Ambulatory Visit: Payer: Self-pay | Admitting: Internal Medicine

## 2018-01-20 DIAGNOSIS — G8929 Other chronic pain: Secondary | ICD-10-CM

## 2018-01-20 DIAGNOSIS — M542 Cervicalgia: Principal | ICD-10-CM

## 2018-01-20 DIAGNOSIS — M5412 Radiculopathy, cervical region: Secondary | ICD-10-CM

## 2018-01-20 DIAGNOSIS — M5030 Other cervical disc degeneration, unspecified cervical region: Secondary | ICD-10-CM

## 2018-01-23 ENCOUNTER — Telehealth: Payer: Self-pay | Admitting: *Deleted

## 2018-01-23 NOTE — Telephone Encounter (Signed)
Patient called and stated that Dr. Mariea Clonts was referring her to see a NeuroSurgeon. Patient is requesting to see Dr. Sherwood Gambler on a Tues., Wed., or Thursday Morning due to Transportation.   I cannot add notes to Referral.

## 2018-02-01 ENCOUNTER — Other Ambulatory Visit: Payer: Self-pay | Admitting: Internal Medicine

## 2018-02-07 DIAGNOSIS — M47812 Spondylosis without myelopathy or radiculopathy, cervical region: Secondary | ICD-10-CM | POA: Diagnosis not present

## 2018-02-07 DIAGNOSIS — I10 Essential (primary) hypertension: Secondary | ICD-10-CM | POA: Diagnosis not present

## 2018-02-07 DIAGNOSIS — Z6827 Body mass index (BMI) 27.0-27.9, adult: Secondary | ICD-10-CM | POA: Diagnosis not present

## 2018-02-07 DIAGNOSIS — M542 Cervicalgia: Secondary | ICD-10-CM | POA: Diagnosis not present

## 2018-02-07 DIAGNOSIS — M5030 Other cervical disc degeneration, unspecified cervical region: Secondary | ICD-10-CM | POA: Diagnosis not present

## 2018-02-09 ENCOUNTER — Other Ambulatory Visit: Payer: Self-pay | Admitting: Internal Medicine

## 2018-02-23 ENCOUNTER — Other Ambulatory Visit: Payer: Self-pay | Admitting: Internal Medicine

## 2018-04-06 ENCOUNTER — Other Ambulatory Visit: Payer: Self-pay | Admitting: Internal Medicine

## 2018-05-22 ENCOUNTER — Other Ambulatory Visit: Payer: Self-pay | Admitting: Internal Medicine

## 2018-05-29 ENCOUNTER — Other Ambulatory Visit: Payer: Self-pay | Admitting: Internal Medicine

## 2018-07-08 ENCOUNTER — Other Ambulatory Visit: Payer: Self-pay | Admitting: Internal Medicine

## 2018-08-11 DIAGNOSIS — Z20828 Contact with and (suspected) exposure to other viral communicable diseases: Secondary | ICD-10-CM | POA: Diagnosis not present

## 2018-08-15 ENCOUNTER — Other Ambulatory Visit: Payer: Self-pay | Admitting: Internal Medicine

## 2018-08-17 ENCOUNTER — Other Ambulatory Visit: Payer: Self-pay | Admitting: Internal Medicine

## 2018-08-21 DIAGNOSIS — Z20828 Contact with and (suspected) exposure to other viral communicable diseases: Secondary | ICD-10-CM | POA: Diagnosis not present

## 2018-09-03 ENCOUNTER — Other Ambulatory Visit: Payer: Self-pay | Admitting: Internal Medicine

## 2018-10-09 ENCOUNTER — Other Ambulatory Visit: Payer: Self-pay | Admitting: Interventional Cardiology

## 2018-10-26 DIAGNOSIS — Z23 Encounter for immunization: Secondary | ICD-10-CM | POA: Diagnosis not present

## 2018-11-01 DIAGNOSIS — H40013 Open angle with borderline findings, low risk, bilateral: Secondary | ICD-10-CM | POA: Diagnosis not present

## 2018-11-01 DIAGNOSIS — Z961 Presence of intraocular lens: Secondary | ICD-10-CM | POA: Diagnosis not present

## 2018-11-01 DIAGNOSIS — H35033 Hypertensive retinopathy, bilateral: Secondary | ICD-10-CM | POA: Diagnosis not present

## 2018-11-01 DIAGNOSIS — H04123 Dry eye syndrome of bilateral lacrimal glands: Secondary | ICD-10-CM | POA: Diagnosis not present

## 2018-11-01 LAB — HM DIABETES EYE EXAM

## 2018-11-13 ENCOUNTER — Other Ambulatory Visit: Payer: Self-pay | Admitting: Internal Medicine

## 2018-11-27 ENCOUNTER — Other Ambulatory Visit: Payer: Self-pay | Admitting: Internal Medicine

## 2018-11-27 NOTE — Telephone Encounter (Signed)
Patient had not been seen since last December. Called and scheduled follow up appointment for patient to see provider.

## 2018-11-28 NOTE — Telephone Encounter (Signed)
Thanks

## 2018-12-04 NOTE — Progress Notes (Signed)
Cardiology Office Note:    Date:  12/06/2018   ID:  Kelsey Salinas, DOB [birth year], MRN [id number]  PCP:  Gayland Curry, DO  Cardiologist:  Sinclair Grooms, MD   Referring MD: Gayland Curry, DO   Chief Complaint  Patient presents with  . Congestive Heart Failure    Diastolic  . Irregular Heart Beat    History of Present Illness:    Kelsey Salinas is a 85 y.o. female with a hx of chronicdiastolic heart failure, tachycardia, and hypertension.  She ambulated in today with the assistance of a rolling walker.  No difficulty with dyspnea, chest pain, dizziness, or medication regimen.  She denies palpitations and has not had lower extremity swelling.  She is very cheerful at the age of 3 and doing quite well.  Past Medical History:  Diagnosis Date  . Childhood asthma   . Chronic diastolic heart failure [facility]) 12/25/2012   October 2013 echocardiogram 1. There is moderate concentric left ventricle hypertrophy. 2. Left ventricular ejection fraction estimated by 2D at 60-65 percent. 3. There were no regional wall motion abnormalities. 4. Mild mitral annular calcification. 5. Mild mitral valve regurgitation. 6. There is mild tricuspid regurgitation. 7. Mildly elevated estimated right ventricular systolic pressure. 8. Right ventricular systolic pressure estimated at 35-40 mm Hg. 9. Mild calcification of the aortic valve. 10. Trace aortic valve regurgitation. 11. Analysis of mitral valve inflow, pulmonary vein Doppler and tissue Doppler suggests grade I diastolic dysfunction without elevated left atrial pressure.   . Colon polyp   . Diverticular stricture ([place]) 2009  . Diverticulosis 10-30-2007  . Edema 09/12/13  . Fall 05/18/2017   tripped over wheel of walker; superior and inferior pubic ramus fractures   . Hypercholesterolemia   . Hypertension   . Hypertension, essential   . Hypothyroidism    as a teenager  . Indigestion   . Insomnia   . OA (osteoarthritis) of hip    right hip  replacement, OA in bilateral knees  . Osteopenia    Actonel stopped in 2012 after 5years therapy  . Pneumonia 1939   "hospitalized"  . Rapid resting heart rate 45/80/9983   Uncertain cause. Possible ectopic atrial tachycardia. Date entered 12/25/12   . Right rotator cuff tear   . Seasonal allergies     Past Surgical History:  Procedure Laterality Date  . CATARACT EXTRACTION W/ INTRAOCULAR LENS  IMPLANT, BILATERAL Bilateral   . COLONOSCOPY  2009   Dr. [facility]mond [facility]  . DILATION AND CURETTAGE OF UTERUS    . FRACTURE SURGERY    . JOINT REPLACEMENT    . ORIF WRIST FRACTURE Right   . REDUCTION MAMMAPLASTY  1999  . TOTAL HIP ARTHROPLASTY Right 03-04-08   Dr. Wynelle Link    Current Medications: Current Meds  Medication Sig  . alendronate (FOSAMAX) 70 MG tablet TAKE 1 TAB ONCE A WEEK, AT LEAST 30 MIN BEFORE 1ST FOOD.DO NOT LIE DOWN FOR 30 MIN AFTER TAKING.  Marland Kitchen aspirin EC 81 MG tablet Take 81 mg by mouth daily.  Marland Kitchen atorvastatin (LIPITOR) 10 MG tablet TAKE 1 TABLET AT BEDTIME FOR CHOLESTEROL.  Marland Kitchen cetirizine (ZYRTEC) 10 MG tablet Take 1 tablet (10 mg total) by mouth at bedtime.  . Cholecalciferol (VITAMIN D PO) Take 1 tablet by mouth every 7 (seven) days. 50,000 units  . diclofenac sodium (VOLTAREN) 1 % GEL Apply 2 g topically every 6 (six) hours as needed.  . donepezil (ARICEPT) 5 MG tablet TAKE ONE TABLET AT BEDTIME.  Marland Kitchen  famotidine (PEPCID) 20 MG tablet Take 20 mg by mouth daily.  . hydrochlorothiazide (HYDRODIURIL) 25 MG tablet TAKE 1 TABLET BY MOUTH DAILY. TAKE WITH LOSARTAN  . HYDROcodone-Chlorpheniramine 5-4 MG/5ML SOLN Take 5 mLs by mouth at bedtime as needed (cough).  . losartan (COZAAR) 100 MG tablet TAKE 1 TABLET BY MOUTH DAILY. TAKE WITH HCTZ  . losartan-hydrochlorothiazide (HYZAAR) 100-25 MG tablet TAKE 1 TABLET EACH DAY.  . metFORMIN (GLUCOPHAGE) 500 MG tablet TAKE (1/2) TABLET DAILY.  . metoprolol succinate (TOPROL-XL) 100 MG 24 hr tablet TAKE 1&1/2 TABLETS TWICE DAILY.  Marland Kitchen nystatin cream  (MYCOSTATIN) APPLY TO SKIN TWICE DAILY AS DIRECTED.  Marland Kitchen vitamin B-12 (CYANOCOBALAMIN) 1000 MCG tablet Take 1 tablet (1,000 mcg total) by mouth daily.     Allergies:   Amlodipine besylate, Lisinopril, and Sulfamethoxazole-trimethoprim   Social History   Socioeconomic History  . Marital status: Married    Spouse name: Not on file  . Number of children: Not on file  . Years of education: Not on file  . Highest education level: Not on file  Occupational History  . Occupation: retired travel Hydrologist Needs  . Financial resource strain: Not hard at all  . Food insecurity    Worry: Never true    Inability: Never true  . Transportation needs    Medical: No    Non-medical: No  Tobacco Use  . Smoking status: Former Smoker    Packs/day: 0.12    Years: 50.00    Pack years: 6.00    Types: Cigarettes  . Smokeless tobacco: Never Used  . Tobacco comment: "stopped in the ate 1990s"  Substance and Sexual Activity  . Alcohol use: Yes    Comment: rarely  . Drug use: Never  . Sexual activity: Not Currently  Lifestyle  . Physical activity    Days per week: 7 days    Minutes per session: 20 min  . Stress: To some extent  Relationships  . Social connections    Talks on phone: More than three times a week    Gets together: More than three times a week    Attends religious service: More than 4 times per year    Active member of club or organization: Yes    Attends meetings of clubs or organizations: Never    Relationship status: Married  Other Topics Concern  . Not on file  Social History Narrative   Lives at [place] since 2010   Husband Ilona Sorrel married 1952 (husband in memory care unit at [employer])   Former smoker 1/2 PPD, for 30 years, stopped 01/10/1989   Alcohol 2 glasses of wine at night   Exercise: machines 3 times a week and daily walk   No POA/LW           Family History: The patient's family history includes Heart disease in her brother, father, and mother.   ROS:   Please see the history of present illness.    She has some issue with her lower back.  She has no complaints however.  All other systems reviewed and are negative.  EKGs/Labs/Other Studies Reviewed:    The following studies were reviewed today: No new functional data  EKG:  EKG normal sinus rhythm, heart rate 97 bpm, QS pattern V1 and V2.  When compared to prior tracings from 2019, the heart rate is slower.  Recent Labs: 12/13/2017: ALT 12; BUN 26; Creatinine 1.1; Hemoglobin 14.1; Platelets 175; Potassium 4.0; Sodium 140  Recent Lipid Panel  Component Value Date/Time   CHOL 156 06/02/2017 1111   TRIG 127 06/02/2017 1111   HDL 52 06/02/2017 1111   LDLCALC 79 06/02/2017 1111    Physical Exam:    VS:  BP (!) 148/88   Pulse 99   Ht 5\' 8"  (1.727 m)   Wt 177 lb (80.3 kg)   SpO2 92%   BMI 26.91 kg/m     Wt Readings from Last 3 Encounters:  12/06/18 177 lb (80.3 kg)  12/07/17 176 lb (79.8 kg)  10/26/17 177 lb 6.4 oz (80.5 kg)     GEN: Compatible with age. No acute distress HEENT: Normal NECK: No JVD. LYMPHATICS: No lymphadenopathy CARDIAC:  RRR without murmur, gallop, or edema. VASCULAR:  Normal Pulses. No bruits. RESPIRATORY:  Clear to auscultation without rales, wheezing or rhonchi  ABDOMEN: Soft, non-tender, non-distended, No pulsatile mass, MUSCULOSKELETAL: No deformity  SKIN: Warm and dry NEUROLOGIC:  Alert and oriented x 3 PSYCHIATRIC:  Normal affect   ASSESSMENT:    1. Chronic diastolic heart failure ([place])   2. Essential hypertension   3. Stage 3a chronic kidney disease   4. Educated about COVID-19 virus infection    PLAN:    In order of problems listed above:  1. No clinical features to suggest volume overload 2. Excellent blood pressure control for age 15. Most recent creatinine is excellent.  Blood work available is between 7 and 11 months.  She has blood work coming up with primary [place].  Data will be forwarded to [place]. 4.  The 3W's is discussed, understood, and is being practiced to avoid Covid infection.     Medication Adjustments/Labs and Tests Ordered: Current medicines are reviewed at length with the patient today.  Concerns regarding medicines are outlined above.  No orders of the defined types were placed in this encounter.  No orders of the defined types were placed in this encounter.   There are no Patient Instructions on file for this visit.   Signed, Sinclair Grooms, MD  12/06/2018 9:58 AM    [place]

## 2018-12-05 ENCOUNTER — Telehealth: Payer: Self-pay | Admitting: Interventional Cardiology

## 2018-12-05 NOTE — Telephone Encounter (Signed)
Spoke with pt and advised her of office policy for visitors with pts.  She states if she is in her w/c she will need assistance.  Asked if she would be and she said she doesn't know.  Advised if she is able to get up here on her own, we can't allow anyone up with her.  If in her w/c, ok for someone to assist.  Pt verbalized understanding.

## 2018-12-05 NOTE — Telephone Encounter (Signed)
° ° °  Patient requesting caregiver to accompany her to 11/25 appointment

## 2018-12-06 ENCOUNTER — Encounter (INDEPENDENT_AMBULATORY_CARE_PROVIDER_SITE_OTHER): Payer: Self-pay

## 2018-12-06 ENCOUNTER — Ambulatory Visit (INDEPENDENT_AMBULATORY_CARE_PROVIDER_SITE_OTHER): Payer: Medicare Other | Admitting: Interventional Cardiology

## 2018-12-06 ENCOUNTER — Other Ambulatory Visit: Payer: Self-pay

## 2018-12-06 ENCOUNTER — Encounter: Payer: Self-pay | Admitting: Interventional Cardiology

## 2018-12-06 VITALS — BP 148/88 | HR 99 | Ht 68.0 in | Wt 177.0 lb

## 2018-12-06 DIAGNOSIS — I5032 Chronic diastolic (congestive) heart failure: Secondary | ICD-10-CM | POA: Diagnosis not present

## 2018-12-06 DIAGNOSIS — N1831 Chronic kidney disease, stage 3a: Secondary | ICD-10-CM | POA: Diagnosis not present

## 2018-12-06 DIAGNOSIS — I130 Hypertensive heart and chronic kidney disease with heart failure and stage 1 through stage 4 chronic kidney disease, or unspecified chronic kidney disease: Secondary | ICD-10-CM | POA: Diagnosis not present

## 2018-12-06 DIAGNOSIS — I10 Essential (primary) hypertension: Secondary | ICD-10-CM

## 2018-12-06 DIAGNOSIS — Z7189 Other specified counseling: Secondary | ICD-10-CM | POA: Diagnosis not present

## 2018-12-06 NOTE — Patient Instructions (Signed)

## 2018-12-13 ENCOUNTER — Telehealth: Payer: Self-pay

## 2018-12-13 ENCOUNTER — Other Ambulatory Visit: Payer: Self-pay

## 2018-12-13 ENCOUNTER — Non-Acute Institutional Stay: Payer: Medicare Other | Admitting: Internal Medicine

## 2018-12-13 ENCOUNTER — Encounter: Payer: Self-pay | Admitting: Internal Medicine

## 2018-12-13 VITALS — BP 128/80 | HR 91 | Temp 97.5°F | Ht 68.0 in | Wt 175.0 lb

## 2018-12-13 DIAGNOSIS — Z66 Do not resuscitate: Secondary | ICD-10-CM

## 2018-12-13 DIAGNOSIS — E538 Deficiency of other specified B group vitamins: Secondary | ICD-10-CM | POA: Insufficient documentation

## 2018-12-13 DIAGNOSIS — N1831 Chronic kidney disease, stage 3a: Secondary | ICD-10-CM

## 2018-12-13 DIAGNOSIS — E1122 Type 2 diabetes mellitus with diabetic chronic kidney disease: Secondary | ICD-10-CM

## 2018-12-13 DIAGNOSIS — M5030 Other cervical disc degeneration, unspecified cervical region: Secondary | ICD-10-CM | POA: Diagnosis not present

## 2018-12-13 DIAGNOSIS — I5032 Chronic diastolic (congestive) heart failure: Secondary | ICD-10-CM

## 2018-12-13 DIAGNOSIS — E1121 Type 2 diabetes mellitus with diabetic nephropathy: Secondary | ICD-10-CM | POA: Diagnosis not present

## 2018-12-13 DIAGNOSIS — R413 Other amnesia: Secondary | ICD-10-CM | POA: Insufficient documentation

## 2018-12-13 DIAGNOSIS — I10 Essential (primary) hypertension: Secondary | ICD-10-CM | POA: Diagnosis not present

## 2018-12-13 DIAGNOSIS — E7800 Pure hypercholesterolemia, unspecified: Secondary | ICD-10-CM | POA: Diagnosis not present

## 2018-12-13 NOTE — Progress Notes (Signed)
Location:  Occupational psychologist of Service:  Clinic (12)  Provider: Julieth Tugman L. Mariea Clonts, D.O., C.M.D.  Code Status: DNR Goals of Care:  Advanced Directives 12/13/2018  Does Patient Have a Medical Advance Directive? Yes  Type of Paramedic of [place];Living will  Does patient want to make changes to medical advance directive? No - Patient declined  Copy of [place] in Chart? Yes - validated most recent copy scanned in chart (See row information)  Would patient like information on creating a medical advance directive? -  Pre-existing out of facility DNR order (yellow form or pink MOST form) -     Chief Complaint  Patient presents with   Medical Management of Chronic Issues    4 month follow-up    Best Practice Recommendations    A1c due, eye exam and foot exam due.     HPI: Patient is a 85 y.o. female seen today for medical management of chronic diseases.    She misses Jearld Fenton a lot.  They got married the summer before she graduated college.    She had a f/u with Dr. Tamala Julian.  She needs labs here.   Her neck continues to bother her.   She's getting a massage and treatment twice a week.  She is knitting a lot.  She holds her head in a certain position.  She's going to take a break for a week.    Memory declining--she had no idea when labs are done here though she'd done this many times, seemed surprised about steps of being brought back by cma for visit.  She admits she does sometimes note she is needing to write things down more.  That's working for now.    She agrees to get her wellness visit by phone.    She had an eye exam with Dr. Payton [facility] office--has new glasses.   Past Medical History:  Diagnosis Date   Childhood asthma    Chronic diastolic heart failure [facility]) 12/25/2012   October 2013 echocardiogram 1. There is moderate concentric left ventricle hypertrophy. 2. Left ventricular ejection fraction estimated  by 2D at 60-65 percent. 3. There were no regional wall motion abnormalities. 4. Mild mitral annular calcification. 5. Mild mitral valve regurgitation. 6. There is mild tricuspid regurgitation. 7. Mildly elevated estimated right ventricular systolic pressure. 8. Right ventricular systolic pressure estimated at 35-40 mm Hg. 9. Mild calcification of the aortic valve. 10. Trace aortic valve regurgitation. 11. Analysis of mitral valve inflow, pulmonary vein Doppler and tissue Doppler suggests grade I diastolic dysfunction without elevated left atrial pressure.    Colon polyp    Diverticular stricture ([place]) 2009   Diverticulosis 10-30-2007   Edema 09/12/13   Fall 05/18/2017   tripped over wheel of walker; superior and inferior pubic ramus fractures    Hypercholesterolemia    Hypertension    Hypertension, essential    Hypothyroidism    as a teenager   Indigestion    Insomnia    OA (osteoarthritis) of hip    right hip replacement, OA in bilateral knees   Osteopenia    Actonel stopped in 2012 after 5years therapy   Pneumonia 1939   "hospitalized"   Rapid resting heart rate 42/68/3419   Uncertain cause. Possible ectopic atrial tachycardia. Date entered 12/25/12    Right rotator cuff tear    Seasonal allergies     Past Surgical History:  Procedure Laterality Date   CATARACT EXTRACTION W/ INTRAOCULAR LENS  IMPLANT, BILATERAL Bilateral    COLONOSCOPY  2009   Dr. [facility]   DILATION AND CURETTAGE OF UTERUS     FRACTURE SURGERY     JOINT REPLACEMENT     ORIF WRIST FRACTURE Right    REDUCTION MAMMAPLASTY  1999   TOTAL HIP ARTHROPLASTY Right 03-04-08   Dr. Wynelle Link    Allergies  Allergen Reactions   Amlodipine Besylate Other (See Comments)    Edema   Lisinopril Cough    Cough    Sulfamethoxazole-Trimethoprim Other (See Comments)    Unknown reaction     Outpatient Encounter Medications as of 12/13/2018  Medication Sig   alendronate (FOSAMAX) 70 MG tablet  TAKE 1 TAB ONCE A WEEK, AT LEAST 30 MIN BEFORE 1ST FOOD.DO NOT LIE DOWN FOR 30 MIN AFTER TAKING.   aspirin EC 81 MG tablet Take 81 mg by mouth daily.   atorvastatin (LIPITOR) 10 MG tablet TAKE 1 TABLET AT BEDTIME FOR CHOLESTEROL.   cetirizine (ZYRTEC) 10 MG tablet Take 1 tablet (10 mg total) by mouth at bedtime.   Cholecalciferol (VITAMIN D PO) Take 1 tablet by mouth every 7 (seven) days. 50,000 units   diclofenac sodium (VOLTAREN) 1 % GEL Apply 2 g topically every 6 (six) hours as needed.   donepezil (ARICEPT) 5 MG tablet TAKE ONE TABLET AT BEDTIME.   famotidine (PEPCID) 20 MG tablet Take 20 mg by mouth daily.   hydrochlorothiazide (HYDRODIURIL) 25 MG tablet TAKE 1 TABLET BY MOUTH DAILY. TAKE WITH LOSARTAN   HYDROcodone-Chlorpheniramine 5-4 MG/5ML SOLN Take 5 mLs by mouth at bedtime as needed (cough).   losartan (COZAAR) 100 MG tablet TAKE 1 TABLET BY MOUTH DAILY. TAKE WITH HCTZ   losartan-hydrochlorothiazide (HYZAAR) 100-25 MG tablet TAKE 1 TABLET EACH DAY.   metFORMIN (GLUCOPHAGE) 500 MG tablet TAKE (1/2) TABLET DAILY.   metoprolol succinate (TOPROL-XL) 100 MG 24 hr tablet TAKE 1&1/2 TABLETS TWICE DAILY.   nystatin cream (MYCOSTATIN) APPLY TO SKIN TWICE DAILY AS DIRECTED.   vitamin B-12 (CYANOCOBALAMIN) 1000 MCG tablet Take 1 tablet (1,000 mcg total) by mouth daily.   No facility-administered encounter medications on file as of 12/13/2018.     Review of Systems:  Review of Systems  Constitutional: Negative for chills, fever and malaise/fatigue.  HENT: Positive for hearing loss. Negative for congestion.   Eyes: Negative for blurred vision.       New glasses  Respiratory: Negative for cough and shortness of breath.   Cardiovascular: Negative for chest pain, palpitations and leg swelling.  Musculoskeletal: Positive for neck pain. Negative for falls.  Skin: Negative for itching and rash.  Neurological: Positive for sensory change. Negative for dizziness and loss of  consciousness.  Psychiatric/Behavioral: Positive for memory loss. Negative for depression. The patient is not nervous/anxious and does not have insomnia.        Grieving loss of husband    Health Maintenance  Topic Date Due   HEMOGLOBIN A1C  06/14/2018   OPHTHALMOLOGY EXAM  11/04/2018   FOOT EXAM  12/08/2018   TETANUS/TDAP  07/21/2027   INFLUENZA VACCINE  Completed   DEXA SCAN  Completed   PNA vac Low Risk Adult  Completed    Physical Exam: Vitals:   12/13/18 0825  BP: 128/80  Pulse: 91  Temp: (!) 97.5 F (36.4 C)  TempSrc: Temporal  SpO2: 91%  Weight: 175 lb (79.4 kg)  Height: 5\' 8"  (1.727 m)   Body mass index is 26.61 kg/m. Physical Exam Vitals signs reviewed.  Constitutional:  General: She is not in acute distress.    Appearance: Normal appearance. She is not ill-appearing or toxic-appearing.  HENT:     Head: Normocephalic and atraumatic.  Eyes:     Pupils: Pupils are equal, round, and reactive to light.     Comments: glasses  Neck:     Musculoskeletal: Neck supple.  Cardiovascular:     Rate and Rhythm: Normal rate and regular rhythm.  Pulmonary:     Effort: Pulmonary effort is normal.     Breath sounds: Normal breath sounds. No rales.  Abdominal:     General: Bowel sounds are normal.  Musculoskeletal: Normal range of motion.        General: Tenderness present.     Right lower leg: No edema.     Left lower leg: No edema.     Comments: Tenderness of paravertebral muscles of neck  Lymphadenopathy:     Cervical: No cervical adenopathy.  Skin:    General: Skin is warm and dry.  Neurological:     General: No focal deficit present.     Mental Status: She is alert and oriented to person, place, and time.     Comments: But short-term memory loss more evident than before  Psychiatric:        Mood and Affect: Mood normal.     Labs reviewed:  Lab Results  Component Value Date   HGBA1C 6.7 12/13/2017    Assessment/Plan 1. Chronic diastolic  heart failure (HCC) -needs renal function, sodium and potassium reassessed with double hctz in hyzaar and hctz alone -no signs of volume overload and just saw Dr. Job Founds copy him on labs when return  2. Type 2 diabetes mellitus with stage 3a chronic kidney disease, without long-term current use of insulin (HCC) -f/u hba1c, is on low dose metformin, ARB -foot exam done today -will ask CMA to call for copy of eye exam that pt reports was done at Dr. Payton Emerald office (does have new frames vs last time I saw her)  3. Stage 3a chronic kidney disease -f/u cmp with gfr -Avoid nephrotoxic agents like nsaids, dose adjust renally excreted meds, hydrate.  4. Hypercholesterolemia -f/u flp next week, pt came fasting to appt today--labs have never been obtained at appts here so this is evidence of her cognitive decline, unfortunately  5. Essential hypertension -bp satisfactory, f/u bmp  6. DDD (degenerative disc disease), cervical -cont OT  7. Memory loss, short term -clearly worsening -fortunately, no longer driving -needs her annual memory test with [facility] -on aricept 5mg --would like to increase to 10mg  next time  8. B12 deficiency -recheck level which was quite low 12/19 and she was started on supplementation -I was hoping improvement of this would help her cognitive and neuropathy but does not seem it has based on our visit today  Labs/tests ordered:  Cbc, cmp, hba1c, flp, b12 next tuesday Next appt:  4 mos--05/09/2019 And [facility]--12/20/2018 by phone with NP  Avaree Gilberti L. Alexx Giambra, D.O. [facility] [address], [place] [address] Cell Phone (Mon-Fri 8am-5pm):  [phone] On Call:  [phone] & follow prompts after 5pm & weekends Office Phone:  [phone] Office Fax:  [phone]

## 2018-12-13 NOTE — Telephone Encounter (Signed)
I called Dr.Hecker's office at [phone] and requested last eye exam in attempts to close care gap recommendation.  Awaiting fax or return call

## 2018-12-19 DIAGNOSIS — N1831 Chronic kidney disease, stage 3a: Secondary | ICD-10-CM | POA: Diagnosis not present

## 2018-12-19 DIAGNOSIS — I5032 Chronic diastolic (congestive) heart failure: Secondary | ICD-10-CM | POA: Diagnosis not present

## 2018-12-19 DIAGNOSIS — E538 Deficiency of other specified B group vitamins: Secondary | ICD-10-CM | POA: Diagnosis not present

## 2018-12-19 DIAGNOSIS — E785 Hyperlipidemia, unspecified: Secondary | ICD-10-CM | POA: Diagnosis not present

## 2018-12-19 DIAGNOSIS — E1121 Type 2 diabetes mellitus with diabetic nephropathy: Secondary | ICD-10-CM | POA: Diagnosis not present

## 2018-12-19 DIAGNOSIS — D519 Vitamin B12 deficiency anemia, unspecified: Secondary | ICD-10-CM | POA: Diagnosis not present

## 2018-12-19 DIAGNOSIS — E7800 Pure hypercholesterolemia, unspecified: Secondary | ICD-10-CM | POA: Diagnosis not present

## 2018-12-19 DIAGNOSIS — D649 Anemia, unspecified: Secondary | ICD-10-CM | POA: Diagnosis not present

## 2018-12-19 LAB — CBC AND DIFFERENTIAL
HCT: 42 (ref 36–46)
Hemoglobin: 14.1 (ref 12.0–16.0)
Platelets: 199 (ref 150–399)
WBC: 8.7

## 2018-12-19 LAB — CBC: RBC: 4.56 (ref 3.87–5.11)

## 2018-12-19 LAB — BASIC METABOLIC PANEL
BUN: 31 — AB (ref 4–21)
CO2: 25 — AB (ref 13–22)
Chloride: 104 (ref 99–108)
Creatinine: 1 (ref 0.5–1.1)
Glucose: 123
Potassium: 4.2 (ref 3.4–5.3)
Sodium: 142 (ref 137–147)

## 2018-12-19 LAB — LIPID PANEL
Cholesterol: 189 (ref 0–200)
HDL: 63 (ref 35–70)
LDL Cholesterol: 94
Triglycerides: 159 (ref 40–160)

## 2018-12-19 LAB — HEPATIC FUNCTION PANEL
ALT: 10 (ref 7–35)
AST: 13 (ref 13–35)
Alkaline Phosphatase: 76 (ref 25–125)
Bilirubin, Total: 0.5

## 2018-12-19 LAB — COMPREHENSIVE METABOLIC PANEL: Calcium: 9.3 (ref 8.7–10.7)

## 2018-12-19 LAB — HEMOGLOBIN A1C: Hemoglobin A1C: 6.5

## 2018-12-19 LAB — VITAMIN B12: Vitamin B-12: 265

## 2018-12-19 NOTE — Telephone Encounter (Signed)
Last eye exam received and abstracted.

## 2018-12-20 ENCOUNTER — Other Ambulatory Visit: Payer: Self-pay

## 2018-12-20 ENCOUNTER — Encounter: Payer: Self-pay | Admitting: Family

## 2018-12-20 ENCOUNTER — Encounter: Payer: Self-pay | Admitting: *Deleted

## 2018-12-20 ENCOUNTER — Ambulatory Visit (INDEPENDENT_AMBULATORY_CARE_PROVIDER_SITE_OTHER): Payer: Medicare Other | Admitting: Family

## 2018-12-20 DIAGNOSIS — Z0000 Encounter for general adult medical examination without abnormal findings: Secondary | ICD-10-CM | POA: Diagnosis not present

## 2018-12-20 NOTE — Progress Notes (Signed)
This service is provided via telemedicine  No vital signs collected/recorded due to the encounter was a telemedicine visit.   Location of patient (ex: home, work):  Home   Patient consents to a telephone visit:  Yes  Location of the provider (ex: office, home):  Office  Name of any referring provider:  N/A  Names of all persons participating in the telemedicine service and their role in the encounter:  Marisa Cyphers RMA, Chariah Bailey NP, [name] Patient   Time spent on call:  10 min   Sandrea Hughs, NP   Subjective:   Kelsey Salinas is a 85 y.o. female who presents for Medicare Annual (Subsequent) preventive examination.  Review of Systems:   Cardiac Risk Factors include: advanced age (>55men, >41 women);smoking/ tobacco exposure;hypertension;dyslipidemia     Objective:     Vitals: There were no vitals taken for this visit.  There is no height or weight on file to calculate BMI.  Advanced Directives 12/20/2018 12/13/2018 08/03/2017 07/19/2017 07/12/2017 06/21/2017 05/24/2017  Does Patient Have a Medical Advance Directive? Yes Yes Yes Yes Yes Yes Yes  Type of Advance Directive - [place];Living will [place];Living will [place];Living will [place];Living will [place];Living will [place];Living will  Does patient want to make changes to medical advance directive? No - Patient declined No - Patient declined No - Patient declined No - Patient declined No - Patient declined No - Patient declined No - Patient declined  Copy of [place] in Chart? - Yes - validated most recent copy scanned in chart (See row information) Yes Yes Yes Yes Yes  Would patient like information on creating a medical advance directive? - - - - - - -  Pre-existing out of facility DNR order (yellow form or pink MOST form) - - - - - - -    Tobacco Social History    Tobacco Use  Smoking Status Former Smoker  . Packs/day: 0.12  . Years: 50.00  . Pack years: 6.00  . Types: Cigarettes  Smokeless Tobacco Never Used  Tobacco Comment   "stopped in the ate 1990s"     Counseling given: Not Answered Comment: "stopped in the ate 1990s"   Clinical Intake:  Pre-visit preparation completed: No  Pain : No/denies pain  BMI - recorded: 26.1 Nutritional Status: BMI 25 -29 Overweight Nutritional Risks: None Diabetes: No  How often do you need to have someone help you when you read instructions, pamphlets, or other written materials from your doctor or pharmacy?: 1 - Never What is the last grade level you completed in school?: College  Interpreter Needed?: No  Information entered by :: Yeira Gulden FNP-C  Past Medical History:  Diagnosis Date  . Childhood asthma   . Chronic diastolic heart failure [facility]) 12/25/2012   October 2013 echocardiogram 1. There is moderate concentric left ventricle hypertrophy. 2. Left ventricular ejection fraction estimated by 2D at 60-65 percent. 3. There were no regional wall motion abnormalities. 4. Mild mitral annular calcification. 5. Mild mitral valve regurgitation. 6. There is mild tricuspid regurgitation. 7. Mildly elevated estimated right ventricular systolic pressure. 8. Right ventricular systolic pressure estimated at 35-40 mm Hg. 9. Mild calcification of the aortic valve. 10. Trace aortic valve regurgitation. 11. Analysis of mitral valve inflow, pulmonary vein Doppler and tissue Doppler suggests grade I diastolic dysfunction without elevated left atrial pressure.   . Colon polyp   .  Diverticular stricture ([place]) 2009  . Diverticulosis 10-30-2007  . Edema 09/12/13  . Fall 05/18/2017   tripped over wheel of walker; superior and inferior pubic ramus fractures   . Hypercholesterolemia   . Hypertension   . Hypertension, essential   . Hypothyroidism    as a teenager  . Indigestion   . Insomnia   . OA  (osteoarthritis) of hip    right hip replacement, OA in bilateral knees  . Osteopenia    Actonel stopped in 2012 after 5years therapy  . Pneumonia 1939   "hospitalized"  . Rapid resting heart rate 40/08/6759   Uncertain cause. Possible ectopic atrial tachycardia. Date entered 12/25/12   . Right rotator cuff tear   . Seasonal allergies    Past Surgical History:  Procedure Laterality Date  . CATARACT EXTRACTION W/ INTRAOCULAR LENS  IMPLANT, BILATERAL Bilateral   . COLONOSCOPY  2009   Dr. [facility] [employer]  . DILATION AND CURETTAGE OF UTERUS    . FRACTURE SURGERY    . JOINT REPLACEMENT    . ORIF WRIST FRACTURE Right   . REDUCTION MAMMAPLASTY  1999  . TOTAL HIP ARTHROPLASTY Right 03-04-08   Dr. Wynelle Link   Family History  Problem Relation Age of Onset  . Heart disease Mother   . Heart disease Father   . Heart disease Brother    Social History   Socioeconomic History  . Marital status: Married    Spouse name: Not on file  . Number of children: Not on file  . Years of education: Not on file  . Highest education level: Not on file  Occupational History  . Occupation: retired travel Hydrologist Needs  . Financial resource strain: Not hard at all  . Food insecurity    Worry: Never true    Inability: Never true  . Transportation needs    Medical: No    Non-medical: No  Tobacco Use  . Smoking status: Former Smoker    Packs/day: 0.12    Years: 50.00    Pack years: 6.00    Types: Cigarettes  . Smokeless tobacco: Never Used  . Tobacco comment: "stopped in the ate 1990s"  Substance and Sexual Activity  . Alcohol use: Yes    Comment: rarely  . Drug use: Never  . Sexual activity: Not Currently  Lifestyle  . Physical activity    Days per week: 7 days    Minutes per session: 20 min  . Stress: To some extent  Relationships  . Social connections    Talks on phone: More than three times a week    Gets together: More than three times a week    Attends religious service: More than  4 times per year    Active member of club or organization: Yes    Attends meetings of clubs or organizations: Never    Relationship status: Married  Other Topics Concern  . Not on file  Social History Narrative   Lives at [place] since 2010   Husband Ilona Sorrel married 1952 (husband in memory care unit at [employer])   Former smoker 1/2 PPD, for 30 years, stopped 01/10/1989   Alcohol 2 glasses of wine at night   Exercise: machines 3 times a week and daily walk   No POA/LW          Outpatient Encounter Medications as of 12/20/2018  Medication Sig  . alendronate (FOSAMAX) 70 MG tablet TAKE 1 TAB ONCE A WEEK, AT LEAST 30 MIN BEFORE 1ST FOOD.DO  NOT LIE DOWN FOR 30 MIN AFTER TAKING.  Marland Kitchen aspirin EC 81 MG tablet Take 81 mg by mouth daily.  Marland Kitchen atorvastatin (LIPITOR) 10 MG tablet TAKE 1 TABLET AT BEDTIME FOR CHOLESTEROL.  . Cholecalciferol (VITAMIN D PO) Take 1 tablet by mouth every 7 (seven) days. 50,000 units  . donepezil (ARICEPT) 5 MG tablet TAKE ONE TABLET AT BEDTIME.  . famotidine (PEPCID) 20 MG tablet Take 20 mg by mouth daily.  . metFORMIN (GLUCOPHAGE) 500 MG tablet TAKE (1/2) TABLET DAILY.  . metoprolol succinate (TOPROL-XL) 100 MG 24 hr tablet TAKE 1&1/2 TABLETS TWICE DAILY.  . cetirizine (ZYRTEC) 10 MG tablet Take 1 tablet (10 mg total) by mouth at bedtime. (Patient not taking: Reported on 12/20/2018)  . diclofenac sodium (VOLTAREN) 1 % GEL Apply 2 g topically every 6 (six) hours as needed.  . hydrochlorothiazide (HYDRODIURIL) 25 MG tablet TAKE 1 TABLET BY MOUTH DAILY. TAKE WITH LOSARTAN (Patient not taking: Reported on 12/20/2018)  . HYDROcodone-Chlorpheniramine 5-4 MG/5ML SOLN Take 5 mLs by mouth at bedtime as needed (cough). (Patient not taking: Reported on 12/20/2018)  . losartan (COZAAR) 100 MG tablet TAKE 1 TABLET BY MOUTH DAILY. TAKE WITH HCTZ (Patient not taking: Reported on 12/20/2018)  . losartan-hydrochlorothiazide (HYZAAR) 100-25 MG tablet TAKE 1 TABLET EACH DAY. (Patient not  taking: Reported on 12/20/2018)  . nystatin cream (MYCOSTATIN) APPLY TO SKIN TWICE DAILY AS DIRECTED. (Patient not taking: Reported on 12/20/2018)  . vitamin B-12 (CYANOCOBALAMIN) 1000 MCG tablet Take 1 tablet (1,000 mcg total) by mouth daily. (Patient not taking: Reported on 12/20/2018)   No facility-administered encounter medications on file as of 12/20/2018.     Activities of Daily Living In your present state of health, do you have any difficulty performing the following activities: 12/20/2018  Hearing? N  Vision? N  Difficulty concentrating or making decisions? Y  Comment remembering  Walking or climbing stairs? N  Dressing or bathing? N  Doing errands, shopping? Y  Comment Does not drive  Preparing Food and eating ? Y  Comment heats up food  Using the Toilet? N  In the past six months, have you accidently leaked urine? N  Do you have problems with loss of bowel control? N  Managing your Medications? N  Managing your Finances? Y  Comment Daughter assist  Housekeeping or managing your Housekeeping? Y  Comment Has assist  Some recent data might be hidden    Patient Care Team: Gayland Curry, DO as PCP - General (Geriatric Medicine) Belva Crome, MD as PCP - Cardiology (Cardiology) Gaynelle Arabian, MD as Consulting Physician (Orthopedic Surgery) Belva Crome, MD as Consulting Physician (Cardiology) Monna Fam, MD as Consulting Physician (Ophthalmology) Elsie Saas, MD as Consulting Physician (Orthopedic Surgery)    Assessment:   This is a routine wellness examination for Kelsey Salinas.  Exercise Activities and Dietary recommendations Current Exercise Habits: Home exercise routine, Type of exercise: walking, Time (Minutes): 20, Frequency (Times/Week): 6, Weekly Exercise (Minutes/Week): 120, Intensity: Mild  Goals   None     Fall Risk Fall Risk  12/20/2018 12/13/2018 12/07/2017 09/28/2017 08/03/2017  Falls in the past year? 0 1 0 Yes Yes  Number falls in past yr: - 0 0  1 1  Injury with Fall? 0 1 0 Yes Yes  Comment - Broke Pelvis - - -   Is the patient's home free of loose throw rugs in walkways, pet beds, electrical cords, etc?   no      Grab bars in  the bathroom? yes      Handrails on the stairs?   yes      Adequate lighting?   yes  Depression Screen PHQ 2/9 Scores 12/20/2018 12/13/2018 12/07/2017 09/28/2017  PHQ - 2 Score 0 0 0 0     Cognitive Function MMSE - Mini Mental State Exam 07/19/2017 04/07/2016 04/02/2015 12/10/2013  Orientation to time 4 5 5 5   Orientation to Place 5 5 5 5   Registration 3 3 3 3   Attention/ Calculation 5 5 5 5   Recall 1 3 3 3   Language- name 2 objects 2 2 2 2   Language- repeat 1 1 1 1   Language- follow 3 step command 3 3 3 3   Language- read & follow direction 1 1 1 1   Write a sentence 1 1 1 1   Copy design 1 1 1 1   Total score 27 30 30 30      6CIT Screen 12/20/2018  What Year? 0 points  What month? 0 points  What time? 0 points  Count back from 20 0 points  Months in reverse 0 points  Repeat phrase 0 points  Total Score 0    Immunization History  Administered Date(s) Administered  . Influenza, High Dose Seasonal PF 10/20/2018  . Influenza,inj,Quad PF,6+ Mos 11/04/2017  . Influenza-Unspecified 10/25/2013, 10/31/2014, 11/06/2015, 11/01/2016  . Pneumococcal Conjugate-13 06/04/2014  . Tdap 07/19/2017    Qualifies for Shingles Vaccine? No   Screening Tests Health Maintenance  Topic Date Due  . HEMOGLOBIN A1C  06/14/2018  . OPHTHALMOLOGY EXAM  11/01/2019  . FOOT EXAM  12/13/2019  . TETANUS/TDAP  07/21/2027  . INFLUENZA VACCINE  Completed  . DEXA SCAN  Completed  . PNA vac Low Risk Adult  Completed    Cancer Screenings: Lung: Low Dose CT Chest recommended if Age 23-80 years, 30 pack-year currently smoking OR have quit w/in 15years. Patient does not qualify. Breast:  Up to date on Mammogram? Yes   Up to date of Bone Density/Dexa? Yes Colorectal: Aged out   Additional Screenings: Hepatitis C Screening:  Low Risk      Plan:   I have personally reviewed and noted the following in the patient's chart:   . Medical and social history . Use of alcohol, tobacco or illicit drugs  . Current medications and supplements . Functional ability and status . Nutritional status . Physical activity . Advanced directives . List of other physicians . Hospitalizations, surgeries, and ER visits in previous 12 months . Vitals . Screenings to include cognitive, depression, and falls . Referrals and appointments  In addition, I have reviewed and discussed with patient certain preventive protocols, quality metrics, and best practice recommendations. A written personalized care plan for preventive services as well as general preventive health recommendations were provided to patient.  Sandrea Hughs, NP  12/20/2018

## 2018-12-20 NOTE — Patient Instructions (Signed)
Kelsey Salinas , Thank you for taking time to come for your Medicare Wellness Visit. I appreciate your ongoing commitment to your health goals. Please review the following plan we discussed and let me know if I can assist you in the future.   Screening recommendations/referrals: Colonoscopy: Age Out  Mammogram: Up to date  Bone Density : Up to date  Recommended yearly ophthalmology/optometry visit for glaucoma screening and checkup Recommended yearly dental visit for hygiene and checkup  Vaccinations: Influenza vaccine : Up to date  Pneumococcal vaccine : Up to date  Tdap vaccine : Up to date  Shingles vaccine : None     Advanced directives: Yes   Conditions/risks identified: Advance age female > 36 yrs,Hypertension ,Dyslipidemia  Next appointment: 1 year   Preventive Care 83 Years and Older, Female Preventive care refers to lifestyle choices and visits with your health care provider that can promote health and wellness. What does preventive care include?  A yearly physical exam. This is also called an annual well check.  Dental exams once or twice a year.  Routine eye exams. Ask your health care provider how often you should have your eyes checked.  Personal lifestyle choices, including:  Daily care of your teeth and gums.  Regular physical activity.  Eating a healthy diet.  Avoiding tobacco and drug use.  Limiting alcohol use.  Practicing safe sex.  Taking low-dose aspirin every day.  Taking vitamin and mineral supplements as recommended by your health care provider. What happens during an annual well check? The services and screenings done by your health care provider during your annual well check will depend on your age, overall health, lifestyle risk factors, and family history of disease. Counseling  Your health care provider may ask you questions about your:  Alcohol use.  Tobacco use.  Drug use.  Emotional well-being.  Home and relationship  well-being.  Sexual activity.  Eating habits.  History of falls.  Memory and ability to understand (cognition).  Work and work Statistician.  Reproductive health. Screening  You may have the following tests or measurements:  Height, weight, and BMI.  Blood pressure.  Lipid and cholesterol levels. These may be checked every 5 years, or more frequently if you are over 65 years old.  Skin check.  Lung cancer screening. You may have this screening every year starting at age 15 if you have a 30-pack-year history of smoking and currently smoke or have quit within the past 15 years.  Fecal occult blood test (FOBT) of the stool. You may have this test every year starting at age 50.  Flexible sigmoidoscopy or colonoscopy. You may have a sigmoidoscopy every 5 years or a colonoscopy every 10 years starting at age 22.  Hepatitis C blood test.  Hepatitis B blood test.  Sexually transmitted disease (STD) testing.  Diabetes screening. This is done by checking your blood sugar (glucose) after you have not eaten for a while (fasting). You may have this done every 1-3 years.  Bone density scan. This is done to screen for osteoporosis. You may have this done starting at age 29.  Mammogram. This may be done every 1-2 years. Talk to your health care provider about how often you should have regular mammograms. Talk with your health care provider about your test results, treatment options, and if necessary, the need for more tests. Vaccines  Your health care provider may recommend certain vaccines, such as:  Influenza vaccine. This is recommended every year.  Tetanus, diphtheria, and  acellular pertussis (Tdap, Td) vaccine. You may need a Td booster every 10 years.  Zoster vaccine. You may need this after age 74.  Pneumococcal 13-valent conjugate (PCV13) vaccine. One dose is recommended after age 14.  Pneumococcal polysaccharide (PPSV23) vaccine. One dose is recommended after age 57.  Talk to your health care provider about which screenings and vaccines you need and how often you need them. This information is not intended to replace advice given to you by your health care provider. Make sure you discuss any questions you have with your health care provider. Document Released: 01/24/2015 Document Revised: 09/17/2015 Document Reviewed: 10/29/2014 Elsevier Interactive Patient Education  2017 [place] Prevention in the Home Falls can cause injuries. They can happen to people of all ages. There are many things you can do to make your home safe and to help prevent falls. What can I do on the outside of my home?  Regularly fix the edges of walkways and driveways and fix any cracks.  Remove anything that might make you trip as you walk through a door, such as a raised step or threshold.  Trim any bushes or trees on the path to your home.  Use bright outdoor lighting.  Clear any walking paths of anything that might make someone trip, such as rocks or tools.  Regularly check to see if handrails are loose or broken. Make sure that both sides of any steps have handrails.  Any raised decks and porches should have guardrails on the edges.  Have any leaves, snow, or ice cleared regularly.  Use sand or salt on walking paths during winter.  Clean up any spills in your garage right away. This includes oil or grease spills. What can I do in the bathroom?  Use night lights.  Install grab bars by the toilet and in the tub and shower. Do not use towel bars as grab bars.  Use non-skid mats or decals in the tub or shower.  If you need to sit down in the shower, use a plastic, non-slip stool.  Keep the floor dry. Clean up any water that spills on the floor as soon as it happens.  Remove soap buildup in the tub or shower regularly.  Attach bath mats securely with double-sided non-slip rug tape.  Do not have throw rugs and other things on the floor that can make you  trip. What can I do in the bedroom?  Use night lights.  Make sure that you have a light by your bed that is easy to reach.  Do not use any sheets or blankets that are too big for your bed. They should not hang down onto the floor.  Have a firm chair that has side arms. You can use this for support while you get dressed.  Do not have throw rugs and other things on the floor that can make you trip. What can I do in the kitchen?  Clean up any spills right away.  Avoid walking on wet floors.  Keep items that you use a lot in easy-to-reach places.  If you need to reach something above you, use a strong step stool that has a grab bar.  Keep electrical cords out of the way.  Do not use floor polish or wax that makes floors slippery. If you must use wax, use non-skid floor wax.  Do not have throw rugs and other things on the floor that can make you trip. What can I do with my stairs?  Do not leave any items on the stairs.  Make sure that there are handrails on both sides of the stairs and use them. Fix handrails that are broken or loose. Make sure that handrails are as long as the stairways.  Check any carpeting to make sure that it is firmly attached to the stairs. Fix any carpet that is loose or worn.  Avoid having throw rugs at the top or bottom of the stairs. If you do have throw rugs, attach them to the floor with carpet tape.  Make sure that you have a light switch at the top of the stairs and the bottom of the stairs. If you do not have them, ask someone to add them for you. What else can I do to help prevent falls?  Wear shoes that:  Do not have high heels.  Have rubber bottoms.  Are comfortable and fit you well.  Are closed at the toe. Do not wear sandals.  If you use a stepladder:  Make sure that it is fully opened. Do not climb a closed stepladder.  Make sure that both sides of the stepladder are locked into place.  Ask someone to hold it for you, if  possible.  Clearly mark and make sure that you can see:  Any grab bars or handrails.  First and last steps.  Where the edge of each step is.  Use tools that help you move around (mobility aids) if they are needed. These include:  Canes.  Walkers.  Scooters.  Crutches.  Turn on the lights when you go into a dark area. Replace any light bulbs as soon as they burn out.  Set up your furniture so you have a clear path. Avoid moving your furniture around.  If any of your floors are uneven, fix them.  If there are any pets around you, be aware of where they are.  Review your medicines with your doctor. Some medicines can make you feel dizzy. This can increase your chance of falling. Ask your doctor what other things that you can do to help prevent falls. This information is not intended to replace advice given to you by your health care provider. Make sure you discuss any questions you have with your health care provider. Document Released: 10/24/2008 Document Revised: 06/05/2015 Document Reviewed: 02/01/2014 Elsevier Interactive Patient Education  2017 [employer].

## 2019-01-03 ENCOUNTER — Other Ambulatory Visit: Payer: Self-pay | Admitting: Interventional Cardiology

## 2019-01-10 ENCOUNTER — Other Ambulatory Visit: Payer: Self-pay | Admitting: Internal Medicine

## 2019-01-23 DIAGNOSIS — Z23 Encounter for immunization: Secondary | ICD-10-CM | POA: Diagnosis not present

## 2019-02-05 ENCOUNTER — Other Ambulatory Visit: Payer: Self-pay | Admitting: Internal Medicine

## 2019-02-20 DIAGNOSIS — Z23 Encounter for immunization: Secondary | ICD-10-CM | POA: Diagnosis not present

## 2019-02-27 DIAGNOSIS — L814 Other melanin hyperpigmentation: Secondary | ICD-10-CM | POA: Diagnosis not present

## 2019-02-27 DIAGNOSIS — L304 Erythema intertrigo: Secondary | ICD-10-CM | POA: Diagnosis not present

## 2019-02-27 DIAGNOSIS — L720 Epidermal cyst: Secondary | ICD-10-CM | POA: Diagnosis not present

## 2019-02-27 DIAGNOSIS — D1801 Hemangioma of skin and subcutaneous tissue: Secondary | ICD-10-CM | POA: Diagnosis not present

## 2019-02-27 DIAGNOSIS — L570 Actinic keratosis: Secondary | ICD-10-CM | POA: Diagnosis not present

## 2019-03-07 ENCOUNTER — Other Ambulatory Visit: Payer: Self-pay | Admitting: Internal Medicine

## 2019-03-07 NOTE — Telephone Encounter (Signed)
rx sent to pharmacy by e-script  

## 2019-05-07 IMAGING — CT CT PELVIS W/ CM
2 of 3 series · 16 of 46 positions shown, 18 images · IV contrast (omnipaque)
Comparison: 03/04/2008 pelvis radiographs.

CLINICAL DATA: 89 y/o F; fall on right side. Right hip replacement
4414.

EXAM:
CT PELVIS WITH CONTRAST
TECHNIQUE: Multidetector CT imaging of the pelvis was performed using the
standard protocol following the bolus administration of intravenous
contrast.
CONTRAST:  100mL OMNIPAQUE IOHEXOL 300 MG/ML  SOLN

[Series 3: pelvis 2.0 st · axial · 0.95mm/px · z∈[+722,+1052]mm · 13 of 191 slices shown, 15 images]
[im 13/191  soft-tissue]
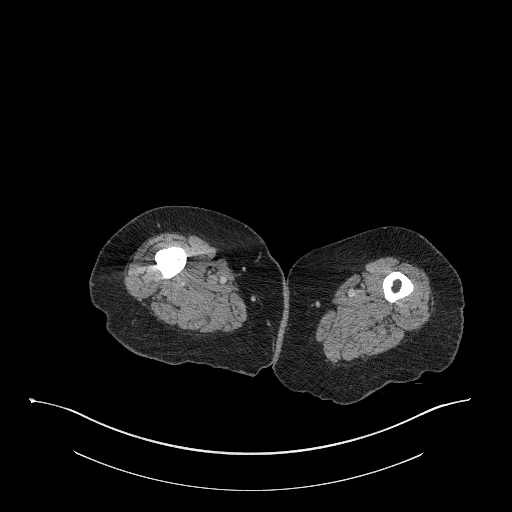
[im 13/191  bone]
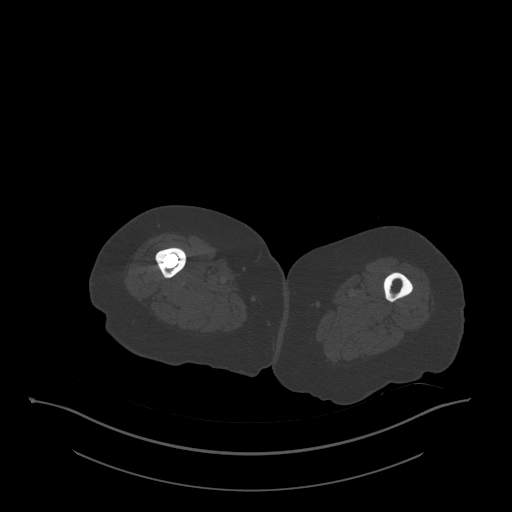
[im 25/191  soft-tissue]
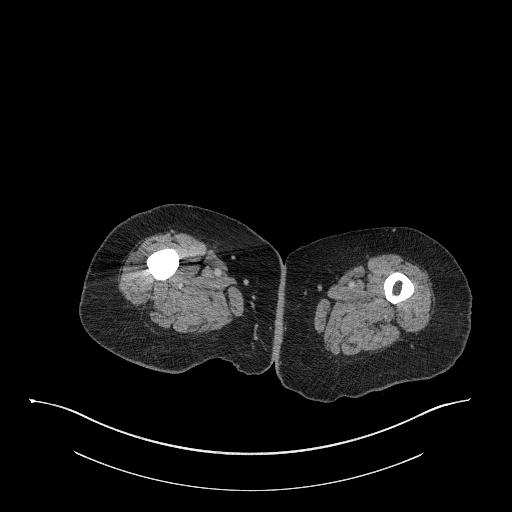
[im 37/191  soft-tissue]
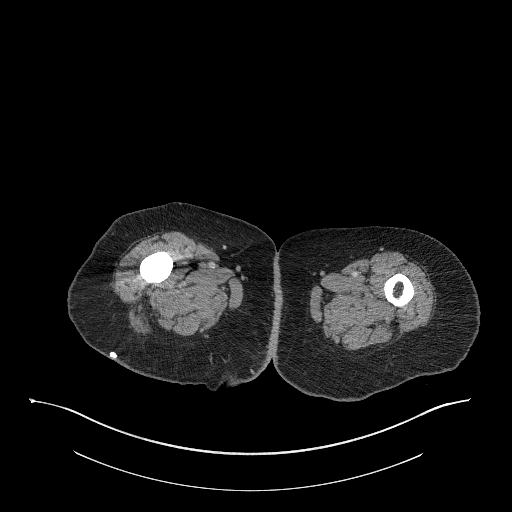
[im 56/191  soft-tissue]
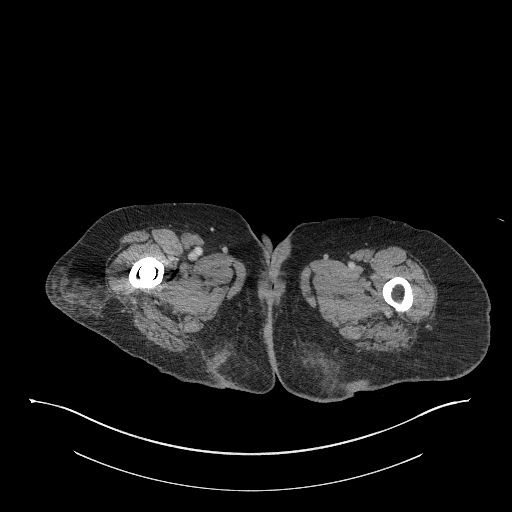
[im 68/191  soft-tissue]
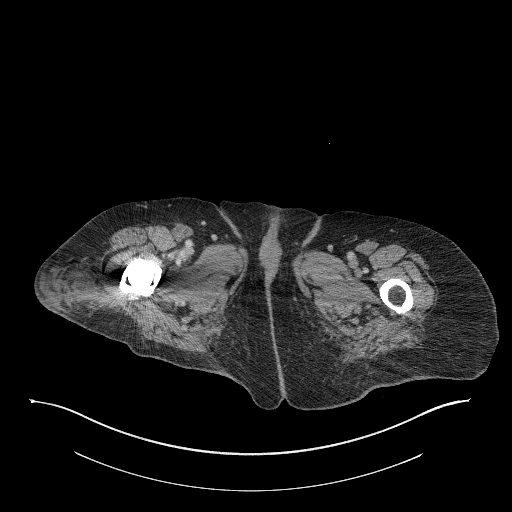
[im 80/191  soft-tissue]
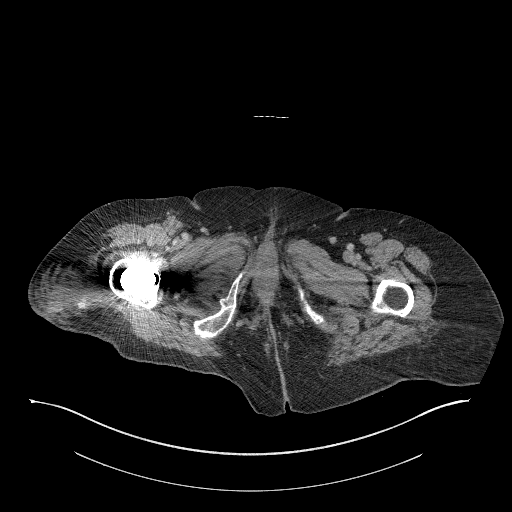
[im 99/191  soft-tissue]
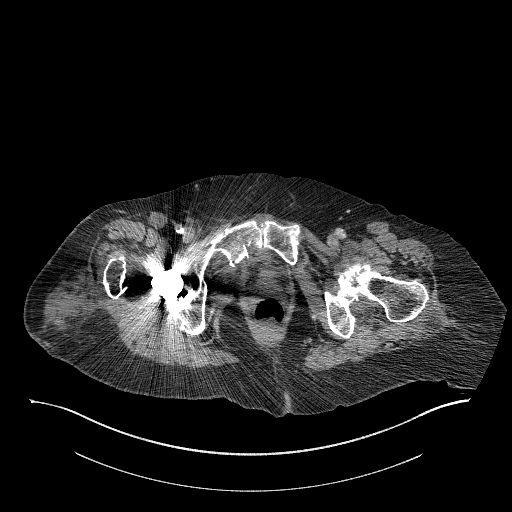
[im 111/191  soft-tissue]
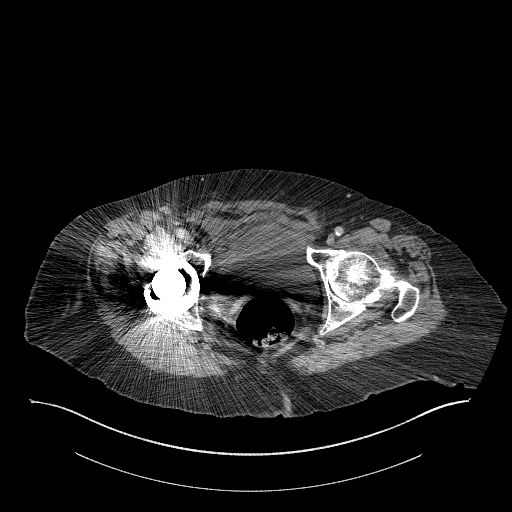
[im 123/191  soft-tissue]
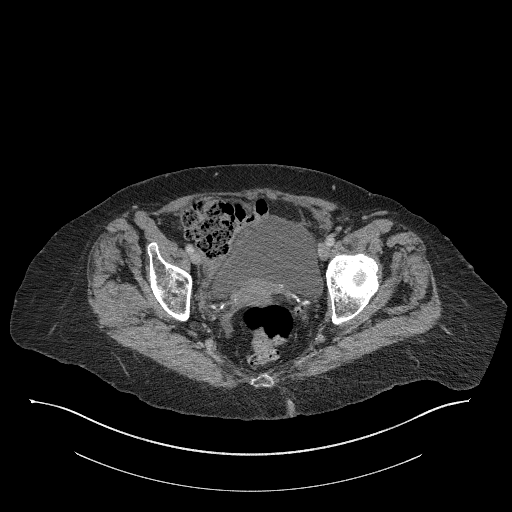
[im 123/191  bone]
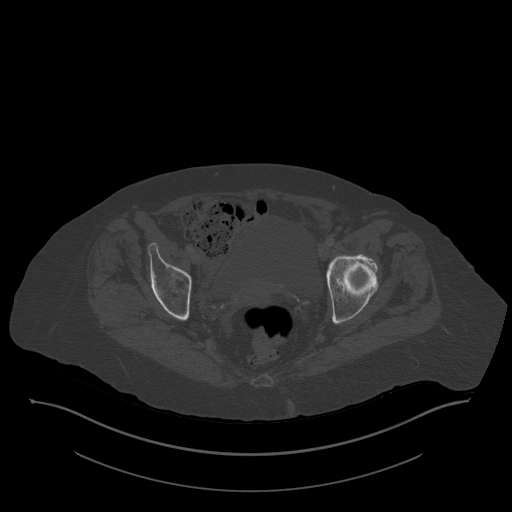
[im 135/191  soft-tissue]
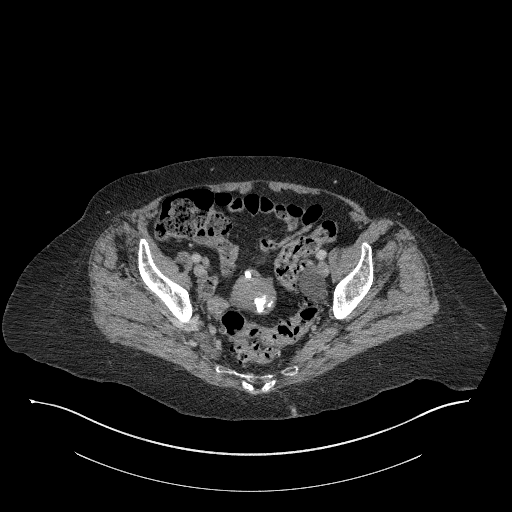
[im 154/191  soft-tissue]
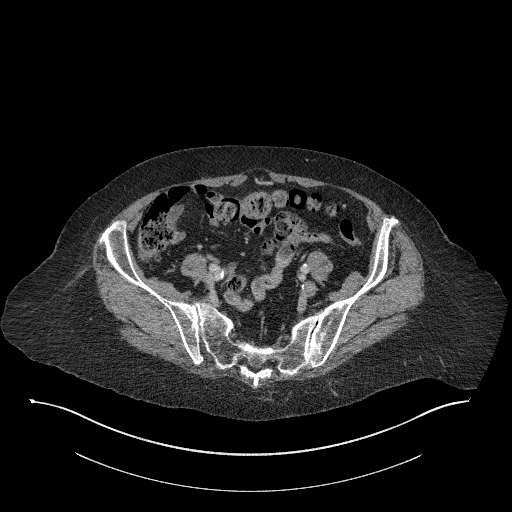
[im 166/191  soft-tissue]
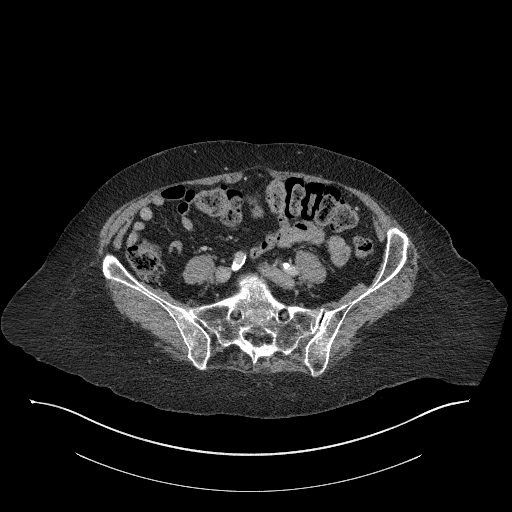
[im 178/191  soft-tissue]
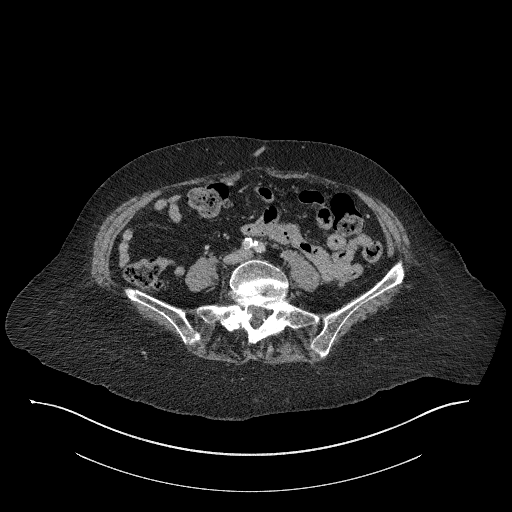

[Series 8: coronal st · coronal · 0.74mm/px · 3 of 149 slices shown]
[im 50/149  soft-tissue]
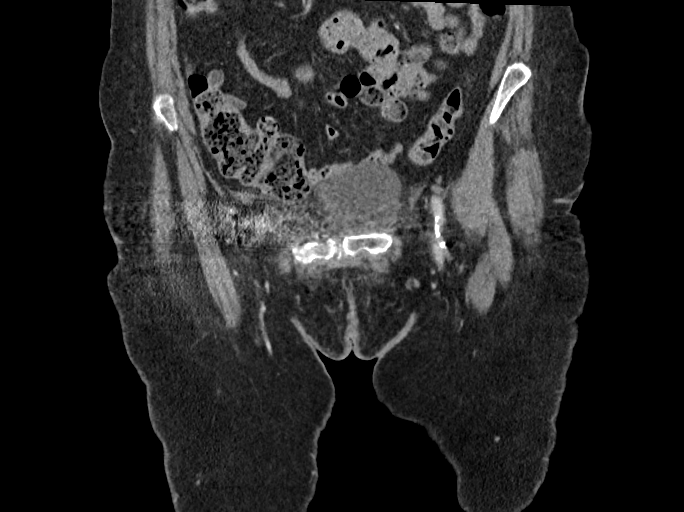
[im 66/149  soft-tissue]
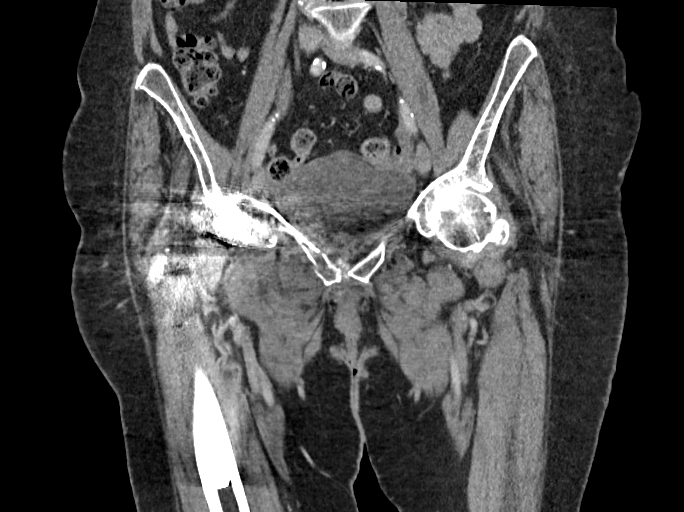
[im 83/149  soft-tissue]
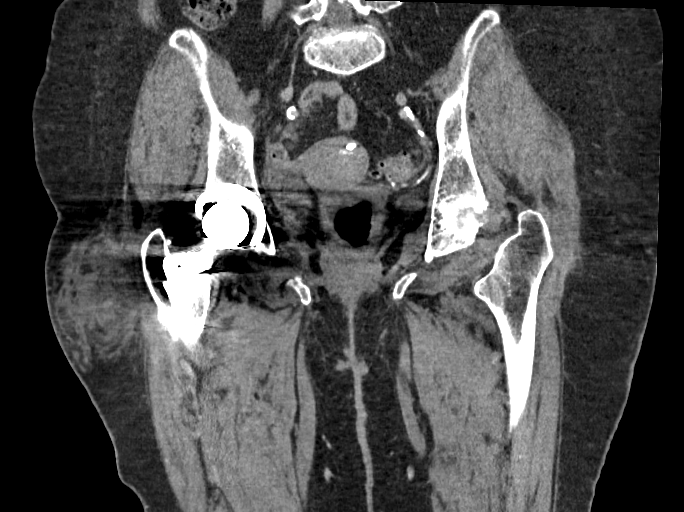

[16 of 46 positions shown; findings below may reference images not displayed]

FINDINGS: Urinary Tract:  No abnormality visualized.

Bowel:  Unremarkable visualized pelvic bowel loops.

Vascular/Lymphatic: Calcific atherosclerosis of the aorta.

Reproductive:  Calcified uterine myomas measuring up to 21 mm.

Other: Ill-defined contusion/hematoma in the soft tissues overlying
the right hip with a blush contrast extravasation indicating active
hemorrhage.

Musculoskeletal: Acute comminuted and minimally displaced fractures
of the right superior and inferior pubic rami. Chronic fracture
deformity of the right posterior inferior pubic rami. Minimally
displaced acute fractures at the margins of left pubic symphysis.
Nondisplaced fracture of the right iliac crest.

Moderate to severe osteoarthrosis of the left hip joint with loss of
joint space and periarticular osteophytes.
IMPRESSION: 1. Ill-defined contusion/hematoma in the soft tissues overlying the
right hip. Blush of contrast indicates active hemorrhage.
2. Acute comminuted and mildly displaced fractures of the right
superior and inferior pubic rami.
3. Minimally displaced acute fractures of the margins of left pubic
symphysis.
4. Nondisplaced acute fracture of the right iliac crest.
5. Moderate to severe osteoarthrosis of left joint.
6. Aortic atherosclerosis.
7. Myomatous uterus.

These results were called by telephone at the time of interpretation
on 05/18/2017 at [DATE] to Dr. DANIEL-NICOLE LASNIBAT , who verbally acknowledged
these results.

By: Bevardo Pielikiene M.D.

## 2019-05-07 IMAGING — DX DG HIP (WITH OR WITHOUT PELVIS) 2-3V*R*
3 series · 3 of 3 positions shown · non-contrast
Comparison: 02/28/2008

CLINICAL DATA: Fall with right hip pain. Right hip replacement 7
years ago.

EXAM:
DG HIP (WITH OR WITHOUT PELVIS) 2-3V RIGHT

[pelvis ap]
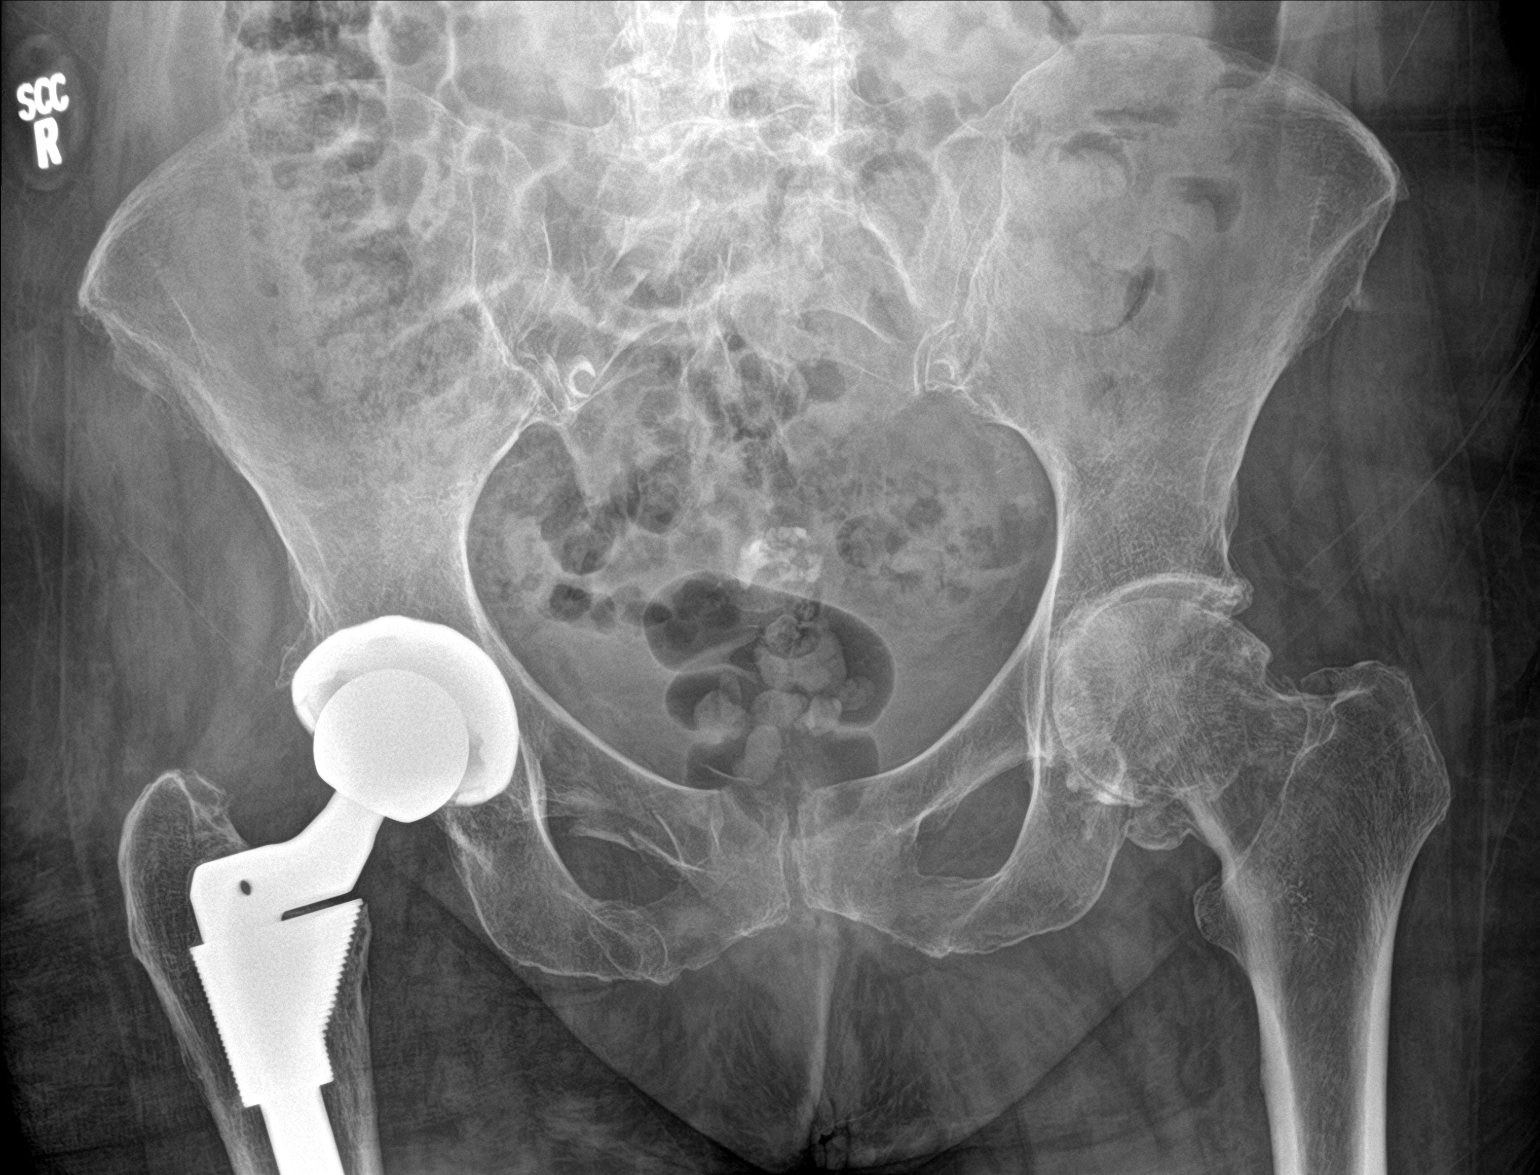

[hip ap]
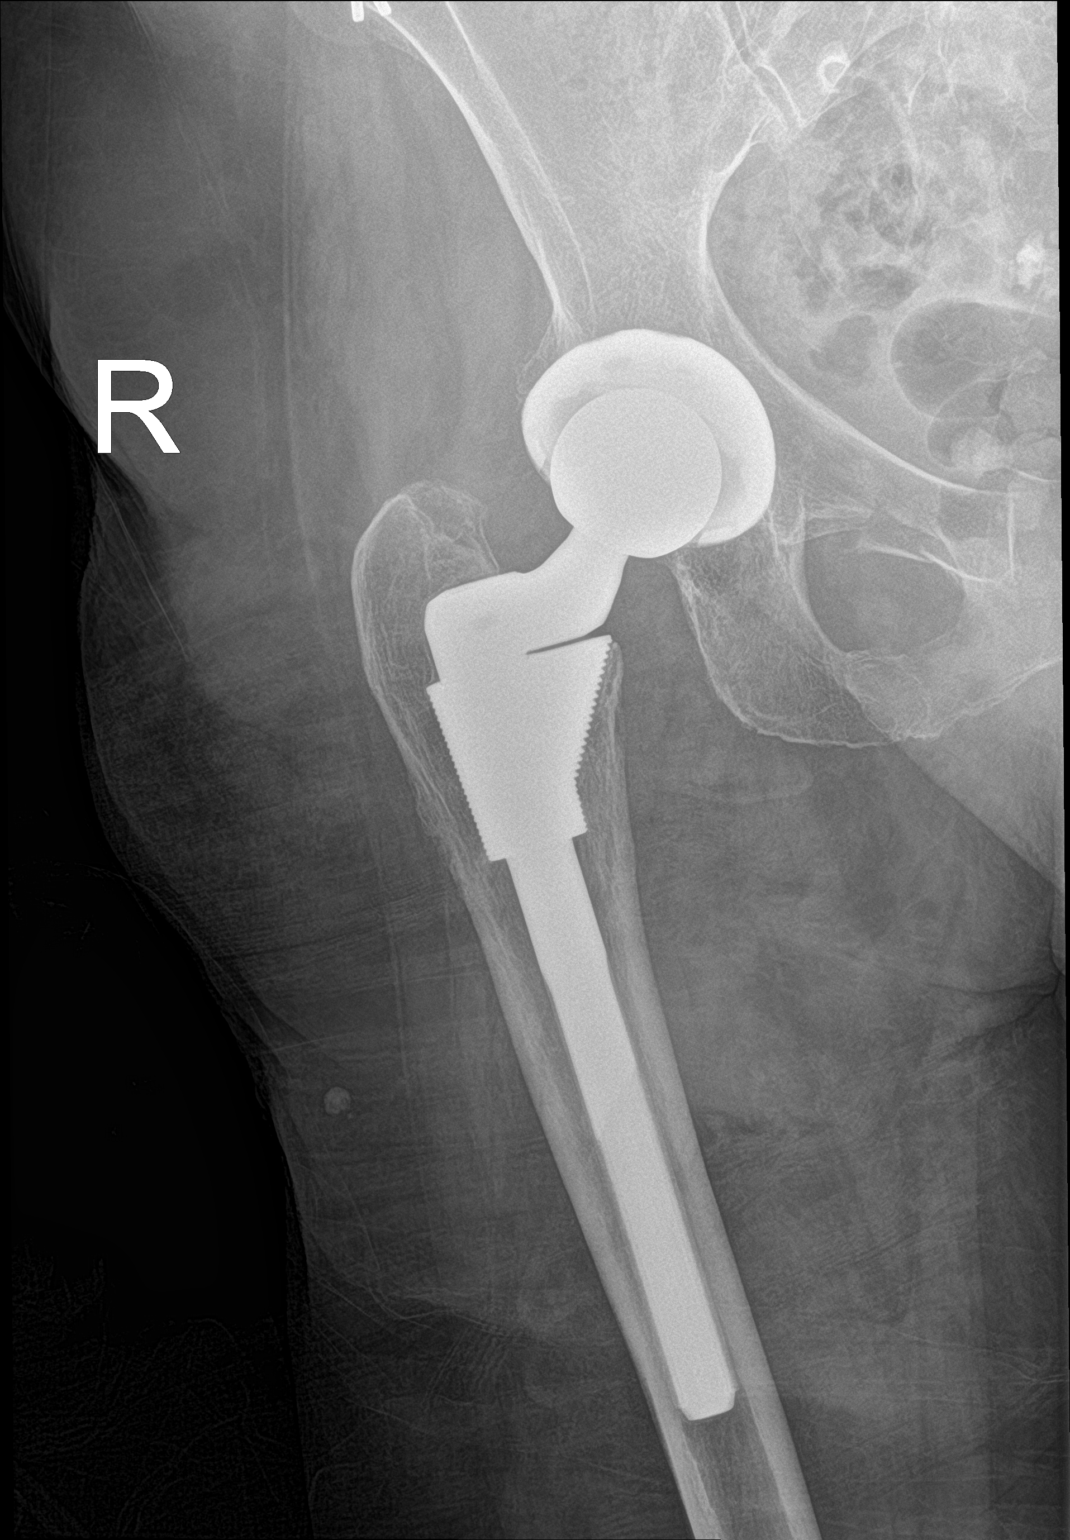

[hip lat]
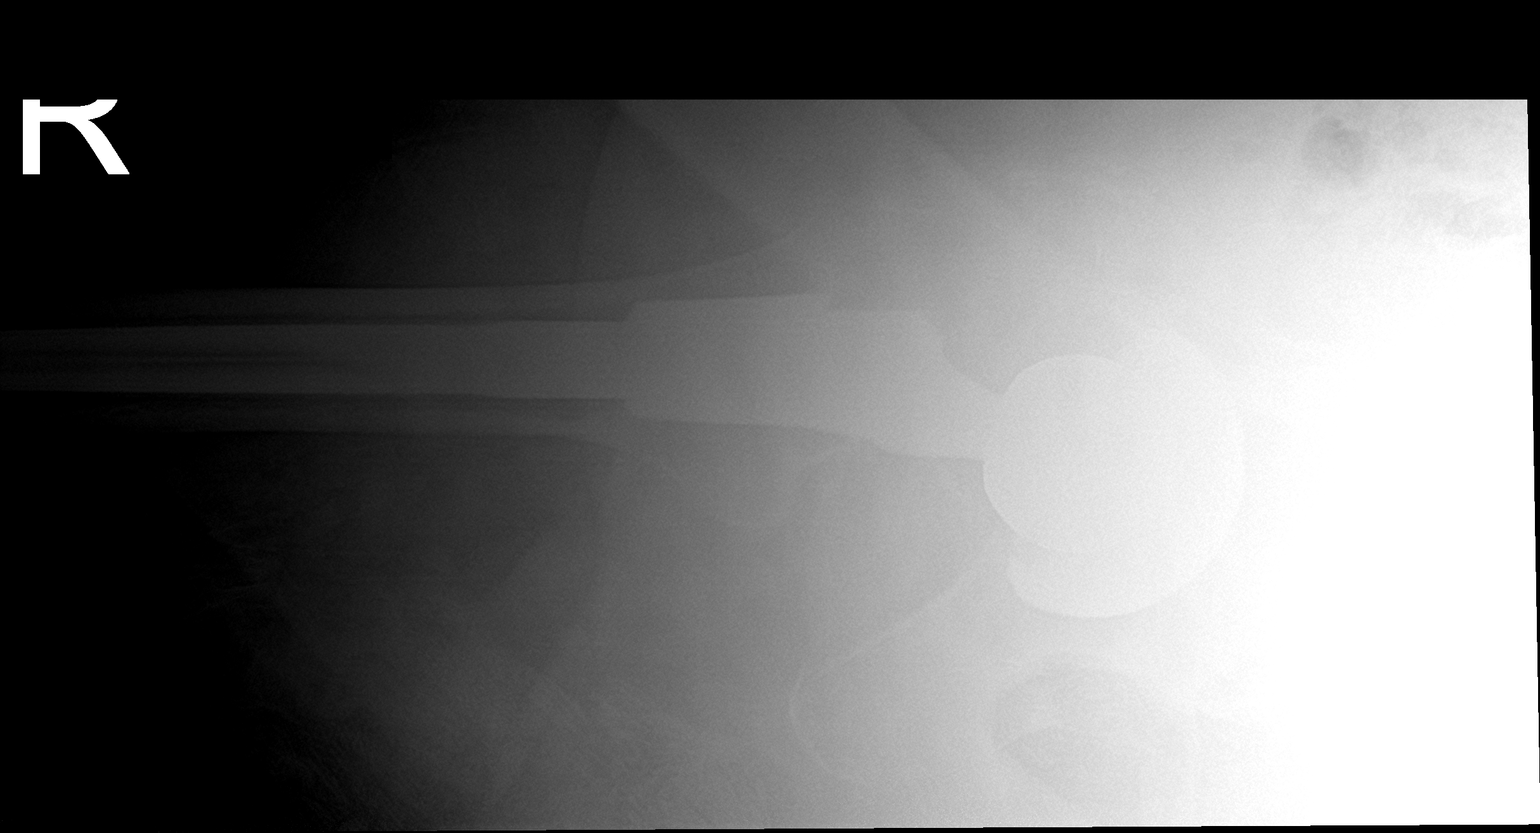

[3 of 3 positions shown; findings below may reference images not displayed]

FINDINGS: Right hip arthroplasty intact. Moderate osteoarthritic change of the
left hip. Old right inferior pubic ramus fracture. Acute displaced
fracture involving the right superior pubic ramus extending to the
symphysis. Degenerate change of the spine.
IMPRESSION: Acute displaced fracture of the right superior pubic ramus extending
to the symphysis.

## 2019-05-09 ENCOUNTER — Other Ambulatory Visit: Payer: Self-pay

## 2019-05-09 ENCOUNTER — Non-Acute Institutional Stay: Payer: Medicare Other | Admitting: Internal Medicine

## 2019-05-09 ENCOUNTER — Encounter: Payer: Self-pay | Admitting: Internal Medicine

## 2019-05-09 VITALS — BP 118/62 | HR 78 | Temp 97.8°F | Ht 68.0 in | Wt 173.0 lb

## 2019-05-09 DIAGNOSIS — N1831 Chronic kidney disease, stage 3a: Secondary | ICD-10-CM

## 2019-05-09 DIAGNOSIS — M5030 Other cervical disc degeneration, unspecified cervical region: Secondary | ICD-10-CM | POA: Diagnosis not present

## 2019-05-09 DIAGNOSIS — E538 Deficiency of other specified B group vitamins: Secondary | ICD-10-CM

## 2019-05-09 DIAGNOSIS — R413 Other amnesia: Secondary | ICD-10-CM

## 2019-05-09 DIAGNOSIS — I10 Essential (primary) hypertension: Secondary | ICD-10-CM | POA: Diagnosis not present

## 2019-05-09 DIAGNOSIS — E7800 Pure hypercholesterolemia, unspecified: Secondary | ICD-10-CM

## 2019-05-09 DIAGNOSIS — E1122 Type 2 diabetes mellitus with diabetic chronic kidney disease: Secondary | ICD-10-CM

## 2019-05-09 DIAGNOSIS — I5032 Chronic diastolic (congestive) heart failure: Secondary | ICD-10-CM | POA: Diagnosis not present

## 2019-05-09 DIAGNOSIS — E1121 Type 2 diabetes mellitus with diabetic nephropathy: Secondary | ICD-10-CM | POA: Diagnosis not present

## 2019-05-09 MED ORDER — DONEPEZIL HCL 10 MG PO TABS: 10.0000 mg | ORAL_TABLET | Freq: Every day | ORAL | 3 refills | Status: DC

## 2019-05-09 NOTE — Progress Notes (Signed)
Location:  Occupational psychologist of Service:  Clinic (12)  Provider: Brittne Kawasaki L. Mariea Clonts, D.O., C.M.D.  Code Status:DNR Goals of Care:  Advanced Directives 05/09/2019  Does Patient Have a Medical Advance Directive? Yes  Type of Advance Directive Out of facility DNR (pink MOST or yellow form)  Does patient want to make changes to medical advance directive? No - Patient declined  Copy of [place] in Chart? -  Would patient like information on creating a medical advance directive? -  Pre-existing out of facility DNR order (yellow form or pink MOST form) -     Chief Complaint  Patient presents with  . Medical Management of Chronic Issues    4 month follow up     HPI: Patient is a 86 y.o. Kelsey Salinas seen today for medical management of chronic diseases.    They ask about prevagen. She is writing things down and tries to keep the notes by the phone.  She and her daughter both admit the memory is getting worse.  Appetite is also not very good per her daughter.  She's not active which is not new.  She walks to dinner and back.  Does not go out for a walk.  Does go play bridge and come back (walking).  She is down just 4 lbs.   She is still using her pillbox.  Her daughter does sometimes help her fill it up.  7 pills/day.   She is still knitting ok.    HR 78 today.  BP was great.  No dizziness or lightheadedness.  No falls.    Coughing a little, but not extraordinary.  Is bothered by her nasal congestion and rhinitis.  Not as bad as it had been.    Neck gives her a fit.  Melissa had been working with her.  She's using heat on it now.    She has not been driving at all.  Kelsey Kelsey Salinas, her aide, has been helping or her children.  Tries not to cook much--does make her breakfast and might eat a can of soup.  Knows to pull the cord if she had an emergency.    7/19 got 27/30 when previously 30/30.  Scored 29/30 today and failed clock but was then able to think  through how it should properly be set.    B12 was low in Dec--on B12 1052mcg daily.    hba1c was 6.5 in Dec.    Past Medical History:  Diagnosis Date  . Childhood asthma   . Chronic diastolic heart failure [facility]) 12/25/2012   October 2013 echocardiogram 1. There is moderate concentric left ventricle hypertrophy. 2. Left ventricular ejection fraction estimated by 2D at 60-65 percent. 3. There were no regional wall motion abnormalities. 4. Mild mitral annular calcification. 5. Mild mitral valve regurgitation. 6. There is mild tricuspid regurgitation. 7. Mildly elevated estimated right ventricular systolic pressure. 8. Right ventricular systolic pressure estimated at 35-40 mm Hg. 9. Mild calcification of the aortic valve. 10. Trace aortic valve regurgitation. 11. Analysis of mitral valve inflow, pulmonary vein Doppler and tissue Doppler suggests grade I diastolic dysfunction without elevated left atrial pressure.   . Colon polyp   . Diverticular stricture ([place]) 2009  . Diverticulosis 10-30-2007  . Edema 09/12/13  . Fall 05/18/2017   tripped over wheel of walker; superior and inferior pubic ramus fractures   . Hypercholesterolemia   . Hypertension   . Hypertension, essential   . Hypothyroidism    as  a teenager  . Indigestion   . Insomnia   . OA (osteoarthritis) of hip    right hip replacement, OA in bilateral knees  . Osteopenia    Actonel stopped in 2012 after 5years therapy  . Pneumonia 1939   "hospitalized"  . Rapid resting heart rate 73/22/0254   Uncertain cause. Possible ectopic atrial tachycardia. Date entered 12/25/12   . Right rotator cuff tear   . Seasonal allergies     Past Surgical History:  Procedure Laterality Date  . CATARACT EXTRACTION W/ INTRAOCULAR LENS  IMPLANT, BILATERAL Bilateral   . COLONOSCOPY  2009   Dr. [facility]  . DILATION AND CURETTAGE OF UTERUS    . FRACTURE SURGERY    . JOINT REPLACEMENT    . ORIF WRIST FRACTURE Right   . REDUCTION MAMMAPLASTY  1999    . TOTAL HIP ARTHROPLASTY Right 03-04-08   Dr. Wynelle Link    Allergies  Allergen Reactions  . Amlodipine Besylate Other (See Comments)    Edema  . Lisinopril Cough    Cough   . Sulfamethoxazole-Trimethoprim Other (See Comments)    Unknown reaction     Outpatient Encounter Medications as of 05/09/2019  Medication Sig  . alendronate (FOSAMAX) 70 MG tablet TAKE 1 TAB ONCE A WEEK, AT LEAST 30 MIN BEFORE 1ST FOOD.DO NOT LIE DOWN FOR 30 MIN AFTER TAKING.  Marland Kitchen aspirin EC 81 MG tablet Take 81 mg by mouth daily.  Marland Kitchen atorvastatin (LIPITOR) 10 MG tablet TAKE 1 TABLET AT BEDTIME FOR CHOLESTEROL.  Marland Kitchen cetirizine (ZYRTEC) 10 MG tablet Take 1 tablet (10 mg total) by mouth at bedtime.  . Cholecalciferol (VITAMIN D PO) Take 1 tablet by mouth every 7 (seven) days. 50,000 units  . diclofenac sodium (VOLTAREN) 1 % GEL Apply 2 g topically every 6 (six) hours as needed.  . donepezil (ARICEPT) 5 MG tablet TAKE ONE TABLET AT BEDTIME.  . famotidine (PEPCID) 20 MG tablet Take 20 mg by mouth daily.  . hydrochlorothiazide (HYDRODIURIL) 25 MG tablet TAKE 1 TABLET BY MOUTH DAILY. TAKE WITH LOSARTAN  . HYDROcodone-Chlorpheniramine 5-4 MG/5ML SOLN Take 5 mLs by mouth at bedtime as needed (cough).  . hydrocortisone 2.5 % cream   . hydrocortisone 2.5 % cream hydrocortisone 2.5 % topical cream  . ketoconazole (NIZORAL) 2 % cream   . losartan (COZAAR) 100 MG tablet TAKE 1 TABLET BY MOUTH DAILY. TAKE WITH HCTZ  . losartan-hydrochlorothiazide (HYZAAR) 100-25 MG tablet TAKE 1 TABLET EACH DAY.  . metFORMIN (GLUCOPHAGE) 500 MG tablet TAKE (1/2) TABLET DAILY.  . metoprolol succinate (TOPROL-XL) 100 MG 24 hr tablet TAKE 1&1/2 TABLETS TWICE DAILY.  Marland Kitchen nystatin cream (MYCOSTATIN) APPLY TO SKIN TWICE DAILY AS DIRECTED.  Marland Kitchen polyethylene glycol powder (GLYCOLAX/MIRALAX) 17 GM/SCOOP powder polyethylene glycol 3350 17 gram oral powder packet   17 g by oral route.  . vitamin B-12 (CYANOCOBALAMIN) 1000 MCG tablet Take 1 tablet (1,000 mcg  total) by mouth daily.   No facility-administered encounter medications on file as of 05/09/2019.    Review of Systems:  ROS  Health Maintenance  Topic Date Due  . COVID-19 Vaccine (1) Never done  . HEMOGLOBIN A1C  06/19/2019  . INFLUENZA VACCINE  08/12/2019  . OPHTHALMOLOGY EXAM  11/01/2019  . FOOT EXAM  12/13/2019  . TETANUS/TDAP  07/21/2027  . DEXA SCAN  Completed  . PNA vac Low Risk Adult  Completed    Physical Exam: Vitals:   05/09/19 0856  BP: 118/62  Pulse: 78  Temp: 97.8  F (36.6 C)  TempSrc: Temporal  SpO2: 96%  Weight: 173 lb (78.5 kg)  Height: 5\' 8"  (1.727 m)   Body mass index is 26.3 kg/m. Physical Exam  Labs reviewed: Basic Metabolic Panel: Recent Labs    12/19/18 0000  NA 142  K 4.2  CL 104  CO2 25*  BUN 31*  CREATININE 1.0  CALCIUM 9.3   Liver Function Tests: Recent Labs    12/19/18 0000  AST 13  ALT 10  ALKPHOS 76   No results for input(s): LIPASE, AMYLASE in the last 8760 hours. No results for input(s): AMMONIA in the last 8760 hours. CBC: Recent Labs    12/19/18 0000  WBC 8.7  HGB 14.1  HCT 42  PLT 199   Lipid Panel: Recent Labs    12/19/18 0000  CHOL 189  HDL 63  LDLCALC 94  TRIG 159   Lab Results  Component Value Date   HGBA1C 6.5 12/19/2018    Assessment/Plan 1. Memory loss, short term - MMSE was 29/30 today, failed clock  - she and her daughter are in favor of increasing to 10mg  from 5mg  nightly - donepezil (ARICEPT) 10 MG tablet; Take 1 tablet (10 mg total) by mouth at bedtime.  Dispense: 30 tablet; Refill: 3  2. Chronic diastolic heart failure (HCC) -clinically euvolemic -cont same regimen  3. Type 2 diabetes mellitus with stage 3a chronic kidney disease, without long-term current use of insulin (HCC) -at lower end of diabetic range last time, recheck in 4 more months -cont metformin, cozaar  4. Hypercholesterolemia -cont low dose lipitor and f/u flp  5. Essential hypertension -bp well  controlled without dizziness or lightheadedness -cont same regimen and monitor  6. DDD (degenerative disc disease), cervical -ongoing, continues to use heat, has voltaren gel prn also  7. B12 deficiency -cont b12 supplement and recheck level to ensure getting repleted adequately orally (absorbing with metformin?)  Labs/tests ordered:  Cbc, cmp, flp, hba1c, b12 before Next appt:  4 mos--09/05/2019  Saydee Zolman L. Corwin Kuiken, D.O. [facility] [address], [place] [address] Cell Phone (Mon-Fri 8am-5pm):  [phone] On Call:  [phone] & follow prompts after 5pm & weekends Office Phone:  [phone] Office Fax:  [phone]

## 2019-05-14 ENCOUNTER — Other Ambulatory Visit: Payer: Self-pay | Admitting: Internal Medicine

## 2019-07-09 ENCOUNTER — Other Ambulatory Visit: Payer: Self-pay | Admitting: Internal Medicine

## 2019-08-01 ENCOUNTER — Other Ambulatory Visit: Payer: Self-pay | Admitting: Internal Medicine

## 2019-08-30 ENCOUNTER — Encounter: Payer: Self-pay | Admitting: Internal Medicine

## 2019-08-30 DIAGNOSIS — D649 Anemia, unspecified: Secondary | ICD-10-CM | POA: Diagnosis not present

## 2019-08-30 DIAGNOSIS — E559 Vitamin D deficiency, unspecified: Secondary | ICD-10-CM | POA: Diagnosis not present

## 2019-08-30 DIAGNOSIS — I10 Essential (primary) hypertension: Secondary | ICD-10-CM | POA: Diagnosis not present

## 2019-08-30 DIAGNOSIS — E119 Type 2 diabetes mellitus without complications: Secondary | ICD-10-CM | POA: Diagnosis not present

## 2019-08-30 DIAGNOSIS — E7800 Pure hypercholesterolemia, unspecified: Secondary | ICD-10-CM | POA: Diagnosis not present

## 2019-08-30 DIAGNOSIS — R739 Hyperglycemia, unspecified: Secondary | ICD-10-CM | POA: Diagnosis not present

## 2019-08-30 DIAGNOSIS — E538 Deficiency of other specified B group vitamins: Secondary | ICD-10-CM | POA: Diagnosis not present

## 2019-08-30 LAB — HEPATIC FUNCTION PANEL
ALT: 10 (ref 7–35)
AST: 15 (ref 13–35)

## 2019-08-30 LAB — COMPREHENSIVE METABOLIC PANEL
Albumin: 4 (ref 3.5–5.0)
Calcium: 9.9 (ref 8.7–10.7)
Globulin: 2.2

## 2019-08-30 LAB — LIPID PANEL
Cholesterol: 179 (ref 0–200)
HDL: 57 (ref 35–70)
LDL Cholesterol: 90
Triglycerides: 157 (ref 40–160)

## 2019-08-30 LAB — CBC AND DIFFERENTIAL
HCT: 42 (ref 36–46)
Hemoglobin: 13.5 (ref 12.0–16.0)
Platelets: 198 (ref 150–399)
WBC: 7.8

## 2019-08-30 LAB — CBC: RBC: 4.44 (ref 3.87–5.11)

## 2019-08-30 LAB — HEMOGLOBIN A1C: Hemoglobin A1C: 6.4

## 2019-08-30 LAB — BASIC METABOLIC PANEL
BUN: 27 — AB (ref 4–21)
CO2: 25 — AB (ref 13–22)
Chloride: 100 (ref 99–108)
Creatinine: 1.1 (ref 0.5–1.1)
Glucose: 111
Potassium: 4.5 (ref 3.4–5.3)
Sodium: 140 (ref 137–147)

## 2019-08-30 LAB — VITAMIN B12: Vitamin B-12: 1697

## 2019-09-05 ENCOUNTER — Non-Acute Institutional Stay: Payer: Medicare Other | Admitting: Internal Medicine

## 2019-09-05 ENCOUNTER — Encounter: Payer: Self-pay | Admitting: Internal Medicine

## 2019-09-05 ENCOUNTER — Other Ambulatory Visit: Payer: Self-pay

## 2019-09-05 VITALS — BP 118/62 | HR 90 | Temp 97.5°F | Wt 172.0 lb

## 2019-09-05 DIAGNOSIS — M5030 Other cervical disc degeneration, unspecified cervical region: Secondary | ICD-10-CM | POA: Diagnosis not present

## 2019-09-05 DIAGNOSIS — R413 Other amnesia: Secondary | ICD-10-CM | POA: Diagnosis not present

## 2019-09-05 DIAGNOSIS — N1831 Chronic kidney disease, stage 3a: Secondary | ICD-10-CM | POA: Diagnosis not present

## 2019-09-05 DIAGNOSIS — E538 Deficiency of other specified B group vitamins: Secondary | ICD-10-CM | POA: Diagnosis not present

## 2019-09-05 DIAGNOSIS — E1122 Type 2 diabetes mellitus with diabetic chronic kidney disease: Secondary | ICD-10-CM

## 2019-09-05 DIAGNOSIS — E1121 Type 2 diabetes mellitus with diabetic nephropathy: Secondary | ICD-10-CM | POA: Diagnosis not present

## 2019-09-05 DIAGNOSIS — E7800 Pure hypercholesterolemia, unspecified: Secondary | ICD-10-CM | POA: Diagnosis not present

## 2019-09-05 DIAGNOSIS — I10 Essential (primary) hypertension: Secondary | ICD-10-CM | POA: Diagnosis not present

## 2019-09-05 DIAGNOSIS — I5032 Chronic diastolic (congestive) heart failure: Secondary | ICD-10-CM | POA: Diagnosis not present

## 2019-09-05 MED ORDER — ATORVASTATIN CALCIUM 20 MG PO TABS
20.0000 mg | ORAL_TABLET | Freq: Every day | ORAL | 3 refills | Status: DC
Start: 2019-09-05 — End: 2020-01-09

## 2019-09-05 MED ORDER — ACETAMINOPHEN 500 MG PO TABS: 500.0000 mg | ORAL_TABLET | Freq: Every day | ORAL | 3 refills | Status: DC

## 2019-09-05 MED ORDER — MEMANTINE HCL 28 X 5 MG & 21 X 10 MG PO TABS: ORAL_TABLET | ORAL | 12 refills | Status: DC

## 2019-09-05 NOTE — Progress Notes (Signed)
Location:  Occupational psychologist of Service:     Provider: Emalene Welte L. Mariea Clonts, D.O., C.M.D.  Code Status: DNR Goals of Care:  Advanced Directives 09/05/2019  Does Patient Have a Medical Advance Directive? Yes  Type of Advance Directive Out of facility DNR (pink MOST or yellow form);Healthcare Power of Attorney  Does patient want to make changes to medical advance directive? No - Patient declined  Copy of [place] in Chart? Yes - validated most recent copy scanned in chart (See row information)  Would patient like information on creating a medical advance directive? -  Pre-existing out of facility DNR order (yellow form or pink MOST form) Pink MOST/Yellow Form most recent copy in chart - Physician notified to receive inpatient order     Chief Complaint  Patient presents with  . Medical Management of Chronic Issues    4 month follow up     HPI: Patient is a 86 y.o. female seen today for medical management of chronic diseases.    She has bene having more trouble with her left side of her neck. Heat helps.  voltaren gel--has tried--only done 2 days so far.  Also may try tylenol for pain.  Does not wake up in pain.  Has to bunch up her pillow on the left side to help her rest.  Still reads and knits a lot.  That's the only past time she gets.  She might do a trip with family for her granddaughter's bday.  She has some concern about memory--has to write down everything and then why she wrote it down.  She could not think of her cousin's husband's name.  Takes aricept 10mg  right after dinner.  She's willing to try namenda as well.    Sugar average has improved from 6.5 to 6.4.  No change needed.  Cholesterol was above goal of less than 90.  We opted to increase her lipitor from 10mg  to 20mg .    Past Medical History:  Diagnosis Date  . Childhood asthma   . Chronic diastolic heart failure [facility]) 12/25/2012   October 2013 echocardiogram 1. There is  moderate concentric left ventricle hypertrophy. 2. Left ventricular ejection fraction estimated by 2D at 60-65 percent. 3. There were no regional wall motion abnormalities. 4. Mild mitral annular calcification. 5. Mild mitral valve regurgitation. 6. There is mild tricuspid regurgitation. 7. Mildly elevated estimated right ventricular systolic pressure. 8. Right ventricular systolic pressure estimated at 35-40 mm Hg. 9. Mild calcification of the aortic valve. 10. Trace aortic valve regurgitation. 11. Analysis of mitral valve inflow, pulmonary vein Doppler and tissue Doppler suggests grade I diastolic dysfunction without elevated left atrial pressure.   . Colon polyp   . Diverticular stricture ([place]) 2009  . Diverticulosis 10-30-2007  . Edema 09/12/13  . Fall 05/18/2017   tripped over wheel of walker; superior and inferior pubic ramus fractures   . Hypercholesterolemia   . Hypertension   . Hypertension, essential   . Hypothyroidism    as a teenager  . Indigestion   . Insomnia   . OA (osteoarthritis) of hip    right hip replacement, OA in bilateral knees  . Osteopenia    Actonel stopped in 2012 after 5years therapy  . Pneumonia 1939   "hospitalized"  . Rapid resting heart rate 63/84/6659   Uncertain cause. Possible ectopic atrial tachycardia. Date entered 12/25/12   . Right rotator cuff tear   . Seasonal allergies     Past  Surgical History:  Procedure Laterality Date  . CATARACT EXTRACTION W/ INTRAOCULAR LENS  IMPLANT, BILATERAL Bilateral   . COLONOSCOPY  2009   Dr. Redmond [employer]  . DILATION AND CURETTAGE OF UTERUS    . FRACTURE SURGERY    . JOINT REPLACEMENT    . ORIF WRIST FRACTURE Right   . REDUCTION MAMMAPLASTY  1999  . TOTAL HIP ARTHROPLASTY Right 03-04-08   Dr. Wynelle Link    Allergies  Allergen Reactions  . Amlodipine Besylate Other (See Comments)    Edema  . Lisinopril Cough    Cough   . Sulfamethoxazole-Trimethoprim Other (See Comments)    Unknown reaction     Outpatient  Encounter Medications as of 09/05/2019  Medication Sig  . alendronate (FOSAMAX) 70 MG tablet TAKE 1 TAB ONCE A WEEK, AT LEAST 30 MIN BEFORE 1ST FOOD.DO NOT LIE DOWN FOR 30 MIN AFTER TAKING.  Marland Kitchen aspirin EC 81 MG tablet Take 81 mg by mouth daily.  Marland Kitchen atorvastatin (LIPITOR) 10 MG tablet TAKE 1 TABLET AT BEDTIME FOR CHOLESTEROL.  Marland Kitchen cetirizine (ZYRTEC) 10 MG tablet Take 1 tablet (10 mg total) by mouth at bedtime.  . Cholecalciferol (VITAMIN D PO) Take 1 tablet by mouth every 7 (seven) days. 50,000 units  . diclofenac sodium (VOLTAREN) 1 % GEL Apply 2 g topically every 6 (six) hours as needed.  . donepezil (ARICEPT) 10 MG tablet Take 1 tablet (10 mg total) by mouth at bedtime.  . famotidine (PEPCID) 20 MG tablet Take 20 mg by mouth daily.  . hydrochlorothiazide (HYDRODIURIL) 25 MG tablet TAKE 1 TABLET BY MOUTH DAILY. TAKE WITH LOSARTAN  . HYDROcodone-Chlorpheniramine 5-4 MG/5ML SOLN Take 5 mLs by mouth at bedtime as needed (cough).  . hydrocortisone 2.5 % cream   . hydrocortisone 2.5 % cream hydrocortisone 2.5 % topical cream  . ketoconazole (NIZORAL) 2 % cream   . losartan (COZAAR) 100 MG tablet TAKE 1 TABLET BY MOUTH DAILY. TAKE WITH HCTZ  . losartan-hydrochlorothiazide (HYZAAR) 100-25 MG tablet TAKE 1 TABLET EACH DAY.  . metFORMIN (GLUCOPHAGE) 500 MG tablet TAKE (1/2) TABLET DAILY.  . metoprolol succinate (TOPROL-XL) 100 MG 24 hr tablet TAKE 1&1/2 TABLETS TWICE DAILY.  Marland Kitchen nystatin cream (MYCOSTATIN) APPLY TO SKIN TWICE DAILY AS DIRECTED.  Marland Kitchen polyethylene glycol powder (GLYCOLAX/MIRALAX) 17 GM/SCOOP powder polyethylene glycol 3350 17 gram oral powder packet   17 g by oral route.  . vitamin B-12 (CYANOCOBALAMIN) 1000 MCG tablet Take 1 tablet (1,000 mcg total) by mouth daily.   No facility-administered encounter medications on file as of 09/05/2019.    Review of Systems:  Review of Systems  Constitutional: Negative for chills, fever and malaise/fatigue.  Eyes: Negative for blurred vision.    Respiratory: Negative for shortness of breath.   Cardiovascular: Negative for chest pain, palpitations and leg swelling.  Gastrointestinal: Negative for abdominal pain, blood in stool, constipation and melena.  Genitourinary: Negative for dysuria.  Musculoskeletal: Positive for joint pain, myalgias and neck pain. Negative for back pain and falls.  Skin: Negative for itching and rash.    Health Maintenance  Topic Date Due  . INFLUENZA VACCINE  08/12/2019  . OPHTHALMOLOGY EXAM  11/01/2019  . FOOT EXAM  12/13/2019  . HEMOGLOBIN A1C  03/01/2020  . TETANUS/TDAP  07/21/2027  . DEXA SCAN  Completed  . COVID-19 Vaccine  Completed  . PNA vac Low Risk Adult  Completed    Physical Exam: Vitals:   09/05/19 0924  BP: 118/62  Pulse: 90  Temp: (!) 97.5  F (36.4 C)  TempSrc: Temporal  SpO2: 96%  Weight: 172 lb (78 kg)   Body mass index is 26.15 kg/m. Physical Exam Vitals reviewed.  Constitutional:      General: She is not in acute distress.    Appearance: Normal appearance. She is not toxic-appearing.  HENT:     Head: Normocephalic and atraumatic.  Cardiovascular:     Rate and Rhythm: Normal rate and regular rhythm.     Heart sounds: No murmur heard.   Pulmonary:     Effort: Pulmonary effort is normal.     Breath sounds: Normal breath sounds.  Abdominal:     Palpations: Abdomen is soft.  Musculoskeletal:        General: Tenderness present.     Right lower leg: No edema.     Left lower leg: No edema.     Comments: Of left neck and shoulder muscles  Skin:    General: Skin is warm and dry.  Neurological:     General: No focal deficit present.     Mental Status: She is alert.     Gait: Gait abnormal.     Comments: Brought by caregiver in wheelchair; short-term memory loss more notable--asks if I got married at beginning and end of visit and also asks about Rx for tylenol twice in 5 mins  Psychiatric:        Mood and Affect: Mood normal.        Behavior: Behavior normal.      Labs reviewed: Basic Metabolic Panel: Recent Labs    12/19/18 0000 08/30/19 0000  NA 142 140  K 4.2 4.5  CL 104 100  CO2 25* 25*  BUN 31* 27*  CREATININE 1.0 1.1  CALCIUM 9.3 9.9   Liver Function Tests: Recent Labs    12/19/18 0000 08/30/19 0000  AST 13 15  ALT 10 10  ALKPHOS 76  --   ALBUMIN  --  4.0   No results for input(s): LIPASE, AMYLASE in the last 8760 hours. No results for input(s): AMMONIA in the last 8760 hours. CBC: Recent Labs    12/19/18 0000 08/30/19 0000  WBC 8.7 7.8  HGB 14.1 13.5  HCT 42 42  PLT 199 198   Lipid Panel: Recent Labs    12/19/18 0000 08/30/19 0000  CHOL 189 179  HDL 63 57  LDLCALC 94 90  TRIG 159 157   Lab Results  Component Value Date   HGBA1C 6.4 08/30/2019    Assessment/Plan 1. Memory loss, short term -start on namenda in addition to aricept as memory continues to decline and she wants "something" for it - once titrated to 10mg  po bid, will maintain this dose as long as tolerates it--will need new order at that time - memantine (NAMENDA TITRATION PAK) tablet pack; 5 mg/day for =1 week; 5 mg twice daily for =1 week; 15 mg/day given in 5 mg and 10 mg separated doses for =1 week; then 10 mg twice daily  Dispense: 49 tablet; Refill: 12  2. Chronic diastolic heart failure (HCC) -stable, euvolemic, cont same regimen, no edema  3. Type 2 diabetes mellitus with stage 3a chronic kidney disease, without long-term current use of insulin (HCC) -slight improvment Lab Results  Component Value Date   HGBA1C 6.4 08/30/2019   4. Hypercholesterolemia -increase lipitor to 20mg  from 10mg  to get to goal LDL under 70 -no side effects reported  5. Essential hypertension -bp well controlled, cont same regimen and monitor  6.  DDD (degenerative disc disease), cervical -continues to have pain from this -cont voltaren gel which was suggested long ago, but may not have been followed through on until a few days ago when she  borrowed some -cont heat -may also use daily tylenol to help and request Rx be sent b/c she thinks any she has would be old  7. B12 deficiency -at goal now, cont supplement to maintain levels  Labs/tests ordered:  Hba1c, flp before Next appt:  4 months with fasting labs before   Oluwatoyin Banales L. Laniyah Rosenwald, D.O. [facility] [address], [place] [address] Cell Phone (Mon-Fri 8am-5pm):  [phone] On Call:  [phone] & follow prompts after 5pm & weekends Office Phone:  [phone] Office Fax:  [phone]

## 2019-09-19 ENCOUNTER — Other Ambulatory Visit: Payer: Self-pay | Admitting: Internal Medicine

## 2019-09-19 DIAGNOSIS — R413 Other amnesia: Secondary | ICD-10-CM

## 2019-09-26 ENCOUNTER — Other Ambulatory Visit: Payer: Self-pay | Admitting: Nurse Practitioner

## 2019-09-26 NOTE — Telephone Encounter (Signed)
High risk or very high risk warning populated when attempting to refill medication. RX request sent to PCP for review and approval if warranted.   

## 2019-10-24 ENCOUNTER — Other Ambulatory Visit: Payer: Self-pay | Admitting: Internal Medicine

## 2019-10-24 DIAGNOSIS — R413 Other amnesia: Secondary | ICD-10-CM

## 2019-11-14 ENCOUNTER — Other Ambulatory Visit: Payer: Self-pay | Admitting: Internal Medicine

## 2019-11-22 ENCOUNTER — Other Ambulatory Visit: Payer: Self-pay | Admitting: Internal Medicine

## 2019-11-22 DIAGNOSIS — R413 Other amnesia: Secondary | ICD-10-CM

## 2019-12-24 ENCOUNTER — Encounter: Payer: Medicare Other | Admitting: Family

## 2019-12-25 ENCOUNTER — Encounter: Payer: Self-pay | Admitting: Family

## 2019-12-25 ENCOUNTER — Ambulatory Visit (INDEPENDENT_AMBULATORY_CARE_PROVIDER_SITE_OTHER): Payer: Medicare Other | Admitting: Family

## 2019-12-25 ENCOUNTER — Other Ambulatory Visit: Payer: Self-pay

## 2019-12-25 DIAGNOSIS — Z0000 Encounter for general adult medical examination without abnormal findings: Secondary | ICD-10-CM

## 2019-12-25 DIAGNOSIS — Z780 Asymptomatic menopausal state: Secondary | ICD-10-CM

## 2019-12-25 NOTE — Progress Notes (Signed)
Subjective:   Kelsey Salinas is a 86 y.o. female who presents for Medicare Annual (Subsequent) preventive examination.  Review of Systems     Cardiac Risk Factors include: advanced age (>71men, >74 women);diabetes mellitus;dyslipidemia;sedentary lifestyle     Objective:    There were no vitals filed for this visit. There is no height or weight on file to calculate BMI.  Advanced Directives 12/25/2019 09/05/2019 05/09/2019 12/20/2018 12/13/2018 08/03/2017 07/19/2017  Does Patient Have a Medical Advance Directive? Yes Yes Yes Yes Yes Yes Yes  Type of Advance Directive Living will;Healthcare Power of Attorney Out of facility DNR (pink MOST or yellow form);Healthcare Power of [employer] of facility DNR (pink MOST or yellow form) - Press photographer;Living will [place];Living will [place];Living will  Does patient want to make changes to medical advance directive? No - Patient declined No - Patient declined No - Patient declined No - Patient declined No - Patient declined No - Patient declined No - Patient declined  Copy of [place] in Chart? Yes - validated most recent copy scanned in chart (See row information) Yes - validated most recent copy scanned in chart (See row information) - - Yes - validated most recent copy scanned in chart (See row information) Yes Yes  Would patient like information on creating a medical advance directive? - - - - - - -  Pre-existing out of facility DNR order (yellow form or pink MOST form) - Pink MOST/Yellow Form most recent copy in chart - Physician notified to receive inpatient order - - - - -    Current Medications (verified) Outpatient Encounter Medications as of 12/25/2019  Medication Sig  . acetaminophen (TYLENOL) 500 MG tablet Take 1 tablet (500 mg total) by mouth daily.  Marland Kitchen alendronate (FOSAMAX) 70 MG tablet TAKE 1 TAB ONCE A WEEK, AT LEAST 30 MIN BEFORE 1ST FOOD.DO NOT LIE DOWN FOR  30 MIN AFTER TAKING.  Marland Kitchen aspirin EC 81 MG tablet Take 81 mg by mouth daily.  Marland Kitchen atorvastatin (LIPITOR) 20 MG tablet Take 1 tablet (20 mg total) by mouth daily.  . cetirizine (ZYRTEC) 10 MG tablet Take 1 tablet (10 mg total) by mouth at bedtime.  . Cholecalciferol (VITAMIN D PO) Take 1 tablet by mouth every 7 (seven) days. 50,000 units  . diclofenac sodium (VOLTAREN) 1 % GEL Apply 2 g topically every 6 (six) hours as needed.  . donepezil (ARICEPT) 10 MG tablet TAKE ONE TABLET AT BEDTIME.  . famotidine (PEPCID) 20 MG tablet Take 20 mg by mouth daily.  . hydrochlorothiazide (HYDRODIURIL) 25 MG tablet TAKE 1 TABLET BY MOUTH DAILY. TAKE WITH LOSARTAN  . HYDROcodone-Chlorpheniramine 5-4 MG/5ML SOLN Take 5 mLs by mouth at bedtime as needed (cough).  . hydrocortisone 2.5 % cream   . hydrocortisone 2.5 % cream hydrocortisone 2.5 % topical cream  . ketoconazole (NIZORAL) 2 % cream   . losartan (COZAAR) 100 MG tablet TAKE 1 TABLET BY MOUTH DAILY. TAKE WITH HCTZ  . losartan-hydrochlorothiazide (HYZAAR) 100-25 MG tablet TAKE 1 TABLET EACH DAY.  . memantine (NAMENDA TITRATION PAK) tablet pack 5 mg/day for =1 week; 5 mg twice daily for =1 week; 15 mg/day given in 5 mg and 10 mg separated doses for =1 week; then 10 mg twice daily  . metFORMIN (GLUCOPHAGE) 500 MG tablet TAKE (1/2) TABLET DAILY.  . metoprolol succinate (TOPROL-XL) 100 MG 24 hr tablet TAKE 1&1/2 TABLETS TWICE DAILY.  Marland Kitchen nystatin cream (MYCOSTATIN) APPLY  TO SKIN TWICE DAILY AS DIRECTED.  Marland Kitchen polyethylene glycol powder (GLYCOLAX/MIRALAX) 17 GM/SCOOP powder polyethylene glycol 3350 17 gram oral powder packet   17 g by oral route.  . vitamin B-12 (CYANOCOBALAMIN) 1000 MCG tablet Take 1 tablet (1,000 mcg total) by mouth daily.   No facility-administered encounter medications on file as of 12/25/2019.    Allergies (verified) Amlodipine besylate, Lisinopril, and Sulfamethoxazole-trimethoprim   History: Past Medical History:  Diagnosis Date  .  Childhood asthma   . Chronic diastolic heart failure [facility]) 12/25/2012   October 2013 echocardiogram 1. There is moderate concentric left ventricle hypertrophy. 2. Left ventricular ejection fraction estimated by 2D at 60-65 percent. 3. There were no regional wall motion abnormalities. 4. Mild mitral annular calcification. 5. Mild mitral valve regurgitation. 6. There is mild tricuspid regurgitation. 7. Mildly elevated estimated right ventricular systolic pressure. 8. Right ventricular systolic pressure estimated at 35-40 mm Hg. 9. Mild calcification of the aortic valve. 10. Trace aortic valve regurgitation. 11. Analysis of mitral valve inflow, pulmonary vein Doppler and tissue Doppler suggests grade I diastolic dysfunction without elevated left atrial pressure.   . Colon polyp   . Diverticular stricture ([place]) 2009  . Diverticulosis 10-30-2007  . Edema 09/12/13  . Fall 05/18/2017   tripped over wheel of walker; superior and inferior pubic ramus fractures   . Hypercholesterolemia   . Hypertension   . Hypertension, essential   . Hypothyroidism    as a teenager  . Indigestion   . Insomnia   . OA (osteoarthritis) of hip    right hip replacement, OA in bilateral knees  . Osteopenia    Actonel stopped in 2012 after 5years therapy  . Pneumonia 1939   "hospitalized"  . Rapid resting heart rate 58/85/0277   Uncertain cause. Possible ectopic atrial tachycardia. Date entered 12/25/12   . Right rotator cuff tear   . Seasonal allergies    Past Surgical History:  Procedure Laterality Date  . CATARACT EXTRACTION W/ INTRAOCULAR LENS  IMPLANT, BILATERAL Bilateral   . COLONOSCOPY  2009   Dr. Redmond [facility]  . DILATION AND CURETTAGE OF UTERUS    . FRACTURE SURGERY    . JOINT REPLACEMENT    . ORIF WRIST FRACTURE Right   . REDUCTION MAMMAPLASTY  1999  . TOTAL HIP ARTHROPLASTY Right 03-04-08   Dr. Wynelle Link   Family History  Problem Relation Age of Onset  . Heart disease Mother   . Heart disease Father   .  Heart disease Brother    Social History   Socioeconomic History  . Marital status: Married    Spouse name: Not on file  . Number of children: Not on file  . Years of education: Not on file  . Highest education level: Not on file  Occupational History  . Occupation: retired travel Music therapist  Tobacco Use  . Smoking status: Former Smoker    Packs/day: 0.12    Years: 50.00    Pack years: 6.00    Types: Cigarettes  . Smokeless tobacco: Never Used  . Tobacco comment: "stopped in the ate 1990s"  Vaping Use  . Vaping Use: Never used  Substance and Sexual Activity  . Alcohol use: Yes    Comment: rarely  . Drug use: Never  . Sexual activity: Not Currently  Other Topics Concern  . Not on file  Social History Narrative   Lives at [facility] since 2010   Husband Ilona Sorrel married 1952 (husband in memory care unit at [facility])   Former  smoker 1/2 PPD, for 30 years, stopped 01/10/1989   Alcohol 2 glasses of wine at night   Exercise: machines 3 times a week and daily walk   No POA/LW         Social Determinants of Health   Financial Resource Strain: Not on file  Food Insecurity: Not on file  Transportation Needs: Not on file  Physical Activity: Not on file  Stress: Not on file  Social Connections: Not on file    Tobacco Counseling Counseling given: Not Answered Comment: "stopped in the ate 1990s"   Clinical Intake:  Pre-visit preparation completed: No  Pain : No/denies pain     BMI - recorded: 26.15 Nutritional Status: BMI 25 -29 Overweight Nutritional Risks: None Diabetes: Yes CBG done?: No Did pt. bring in CBG monitor from home?: No  How often do you need to have someone help you when you read instructions, pamphlets, or other written materials from your doctor or pharmacy?: 3 - Sometimes What is the last grade level you completed in school?: College  Diabetic?Yes   Interpreter Needed?: No  Information entered by :: Anelly Samarin FNP-C   Activities of  Daily Living In your present state of health, do you have any difficulty performing the following activities: 12/25/2019  Hearing? Y  Vision? N  Difficulty concentrating or making decisions? Y  Comment once in a while  Walking or climbing stairs? Y  Comment uses a Games developer or bathing? N  Doing errands, shopping? Y  Comment Family Teaching laboratory technician and eating ? Y  Comment Family assist  Using the Toilet? N  In the past six months, have you accidently leaked urine? N  Do you have problems with loss of bowel control? N  Managing your Medications? N  Managing your Finances? Y  Comment Daughter helps  Housekeeping or managing your Housekeeping? Y  Comment Has assist  Some recent data might be hidden    Patient Care Team: Gayland Curry, DO as PCP - General (Geriatric Medicine) Belva Crome, MD as PCP - Cardiology (Cardiology) Gaynelle Arabian, MD as Consulting Physician (Orthopedic Surgery) Belva Crome, MD as Consulting Physician (Cardiology) Monna Fam, MD as Consulting Physician (Ophthalmology) Elsie Saas, MD as Consulting Physician (Orthopedic Surgery)  Indicate any recent Medical Services you may have received from other than [facility] providers in the past year (date may be approximate).     Assessment:   This is a routine wellness examination for [place].  Hearing/Vision screen  Hearing Screening   125Hz  250Hz  500Hz  1000Hz  2000Hz  3000Hz  4000Hz  6000Hz  8000Hz   Right ear:           Left ear:           Comments: Some hearing concerns. Patient states she's never had a hearing screening.   Vision Screening Comments: No Vision Concerns. Patient last eye exam 2020  Dietary issues and exercise activities discussed: Current Exercise Habits: The patient does not participate in regular exercise at present, Exercise limited by: None identified  Goals   None    Depression Screen PHQ 2/9 Scores 12/25/2019 05/09/2019 12/20/2018 12/13/2018 12/07/2017 09/28/2017  08/03/2017  PHQ - 2 Score 0 0 0 0 0 0 0    Fall Risk Fall Risk  12/25/2019 09/05/2019 05/09/2019 12/20/2018 12/13/2018  Falls in the past year? 1 0 0 0 1  Comment - - - - -  Number falls in past yr: 0 0 0 - 0  Injury with Fall? 0 0  0 0 1  Comment - - - - Broke Pelvis    FALL RISK PREVENTION PERTAINING TO THE HOME:  Any stairs in or around the home? No  If so, are there any without handrails? No  Home free of loose throw rugs in walkways, pet beds, electrical cords, etc? Yes  Adequate lighting in your home to reduce risk of falls? Yes   ASSISTIVE DEVICES UTILIZED TO PREVENT FALLS:  Life alert? Yes  Use of a cane, walker or w/c? Yes  Grab bars in the bathroom? Yes  Shower chair or bench in shower? Yes  Elevated toilet seat or a handicapped toilet? Yes   TIMED UP AND GO:  Was the test performed? No .  Length of time to ambulate 10 feet: N/A sec.   Gait slow and steady with assistive device  Cognitive Function: MMSE - Mini Mental State Exam 05/09/2019 07/19/2017 04/07/2016 04/02/2015 12/10/2013  Orientation to time 4 4 5 5 5   Orientation to Place 5 5 5 5 5   Registration 3 3 3 3 3   Attention/ Calculation 5 5 5 5 5   Recall 3 1 3 3 3   Language- name 2 objects 2 2 2 2 2   Language- repeat 1 1 1 1 1   Language- follow 3 step command 3 3 3 3 3   Language- read & follow direction 1 1 1 1 1   Write a sentence 1 1 1 1 1   Copy design 1 1 1 1 1   Total score 29 27 30 30 30      6CIT Screen 12/25/2019 12/20/2018  What Year? 0 points 0 points  What month? 0 points 0 points  What time? 0 points 0 points  Count back from 20 0 points 0 points  Months in reverse 0 points 0 points  Repeat phrase 8 points 0 points  Total Score 8 0    Immunizations Immunization History  Administered Date(s) Administered  . Influenza, High Dose Seasonal PF 10/20/2018, 11/09/2019  . Influenza,inj,Quad PF,6+ Mos 11/04/2017  . Influenza-Unspecified 10/25/2013, 10/31/2014, 11/06/2015, 11/01/2016  . Moderna  Sars-Covid-2 Vaccination 01/23/2019, 02/20/2019, 11/27/2019  . Pneumococcal Conjugate-13 06/04/2014  . Tdap 07/19/2017    TDAP status: Up to date  Flu Vaccine status: Up to date  Pneumococcal vaccine status: Up to date  Covid-19 vaccine status: Completed vaccines  Qualifies for Shingles Vaccine? Yes   Zostavax completed No   Shingrix Completed?: No.    Education has been provided regarding the importance of this vaccine. Patient has been advised to call insurance company to determine out of pocket expense if they have not yet received this vaccine. Advised may also receive vaccine at local pharmacy or Health Dept. Verbalized acceptance and understanding.  Screening Tests Health Maintenance  Topic Date Due  . OPHTHALMOLOGY EXAM  11/01/2019  . FOOT EXAM  12/13/2019  . HEMOGLOBIN A1C  03/01/2020  . TETANUS/TDAP  07/21/2027  . INFLUENZA VACCINE  Completed  . DEXA SCAN  Completed  . COVID-19 Vaccine  Completed  . PNA vac Low Risk Adult  Completed    Health Maintenance  Health Maintenance Due  Topic Date Due  . OPHTHALMOLOGY EXAM  11/01/2019  . FOOT EXAM  12/13/2019    Colorectal cancer screening: No longer required.   Mammogram status: Completed 09/09/2014. Repeat every year  Bone Density status: Completed 12/21/2012 . Results reflect: Bone density results: OSTEOPENIA. Repeat every 2 years.  Lung Cancer Screening: (Low Dose CT Chest recommended if Age 73-80 years, 30 pack-year currently smoking  OR have quit w/in 15years.) does not qualify.   Lung Cancer Screening Referral:No   Additional Screening:  Hepatitis C Screening: does not qualify; Completed No   Vision Screening: Recommended annual ophthalmology exams for early detection of glaucoma and other disorders of the eye. Is the patient up to date with their annual eye exam?  Yes reports five months ago Who is the provider or what is the name of the office in which the patient attends annual eye exams? Patient  couldn't remember the name of her eye Doctor  If pt is not established with a provider, would they like to be referred to a provider to establish care? No .   Dental Screening: Recommended annual dental exams for proper oral hygiene  Community Resource Referral / Chronic Care Management: CRR required this visit?  No   CCM required this visit?  No     Plan:   - Shigrix vaccine  - Dexa scan   I have personally reviewed and noted the following in the patient's chart:   . Medical and social history . Use of alcohol, tobacco or illicit drugs  . Current medications and supplements . Functional ability and status . Nutritional status . Physical activity . Advanced directives . List of other physicians . Hospitalizations, surgeries, and ER visits in previous 12 months . Vitals . Screenings to include cognitive, depression, and falls . Referrals and appointments  In addition, I have reviewed and discussed with patient certain preventive protocols, quality metrics, and best practice recommendations. A written personalized care plan for preventive services as well as general preventive health recommendations were provided to patient.    Sandrea Hughs, NP   12/25/2019   Nurse Notes:Advised to get Shigrix vaccine at her pharmacy.

## 2019-12-25 NOTE — Progress Notes (Signed)
    This service is provided via telemedicine  No vital signs collected/recorded due to the encounter was a telemedicine visit.   Location of patient (ex: home, work): Home.   Patient consents to a telephone visit: Yes.  Location of the provider (ex: office, home):  [facility].  Name of any referring provider: Reed, Tiffany L, DO   Names of all persons participating in the telemedicine service and their role in the encounter: Patient, Carlee Vonderhaar, RMA, Ngetich, Dinah, NP.    Time spent on call: 8 minutes spent on the phone with Medical Assistant.   

## 2019-12-25 NOTE — Patient Instructions (Signed)
Kelsey Salinas , Thank you for taking time to come for your Medicare Wellness Visit. I appreciate your ongoing commitment to your health goals. Please review the following plan we discussed and let me know if I can assist you in the future.   Screening recommendations/referrals: Colonoscopy : N/A  Mammogram :  Bone Density: Ordered this visit  Recommended yearly ophthalmology/optometry visit for glaucoma screening and checkup Recommended yearly dental visit for hygiene and checkup  Vaccinations: Influenza vaccine: Up to date  Pneumococcal vaccine : Up to date  Tdap vaccine : Up to date  Shingles vaccine: Please get Shigrix vaccine at your Pharmacy    Advanced directives: Yes   Conditions/risks identified:Advance age female > 51 yrs old,Type 2 DM,dyslipidemia,Sedentary lifestyle  Next appointment: 1 year    Preventive Care 53 Years and Older, Female Preventive care refers to lifestyle choices and visits with your health care provider that can promote health and wellness. What does preventive care include?  A yearly physical exam. This is also called an annual well check.  Dental exams once or twice a year.  Routine eye exams. Ask your health care provider how often you should have your eyes checked.  Personal lifestyle choices, including:  Daily care of your teeth and gums.  Regular physical activity.  Eating a healthy diet.  Avoiding tobacco and drug use.  Limiting alcohol use.  Practicing safe sex.  Taking low-dose aspirin every day.  Taking vitamin and mineral supplements as recommended by your health care provider. What happens during an annual well check? The services and screenings done by your health care provider during your annual well check will depend on your age, overall health, lifestyle risk factors, and family history of disease. Counseling  Your health care provider may ask you questions about your:  Alcohol use.  Tobacco use.  Drug  use.  Emotional well-being.  Home and relationship well-being.  Sexual activity.  Eating habits.  History of falls.  Memory and ability to understand (cognition).  Work and work Statistician.  Reproductive health. Screening  You may have the following tests or measurements:  Height, weight, and BMI.  Blood pressure.  Lipid and cholesterol levels. These may be checked every 5 years, or more frequently if you are over 45 years old.  Skin check.  Lung cancer screening. You may have this screening every year starting at age 33 if you have a 30-pack-year history of smoking and currently smoke or have quit within the past 15 years.  Fecal occult blood test (FOBT) of the stool. You may have this test every year starting at age 18.  Flexible sigmoidoscopy or colonoscopy. You may have a sigmoidoscopy every 5 years or a colonoscopy every 10 years starting at age 55.  Hepatitis C blood test.  Hepatitis B blood test.  Sexually transmitted disease (STD) testing.  Diabetes screening. This is done by checking your blood sugar (glucose) after you have not eaten for a while (fasting). You may have this done every 1-3 years.  Bone density scan. This is done to screen for osteoporosis. You may have this done starting at age 84.  Mammogram. This may be done every 1-2 years. Talk to your health care provider about how often you should have regular mammograms. Talk with your health care provider about your test results, treatment options, and if necessary, the need for more tests. Vaccines  Your health care provider may recommend certain vaccines, such as:  Influenza vaccine. This is recommended every year.  Tetanus, diphtheria, and acellular pertussis (Tdap, Td) vaccine. You may need a Td booster every 10 years.  Zoster vaccine. You may need this after age 44.  Pneumococcal 13-valent conjugate (PCV13) vaccine. One dose is recommended after age 55.  Pneumococcal polysaccharide  (PPSV23) vaccine. One dose is recommended after age 5. Talk to your health care provider about which screenings and vaccines you need and how often you need them. This information is not intended to replace advice given to you by your health care provider. Make sure you discuss any questions you have with your health care provider. Document Released: 01/24/2015 Document Revised: 09/17/2015 Document Reviewed: 10/29/2014 Elsevier Interactive Patient Education  2017 [place] Prevention in the Home Falls can cause injuries. They can happen to people of all ages. There are many things you can do to make your home safe and to help prevent falls. What can I do on the outside of my home?  Regularly fix the edges of walkways and driveways and fix any cracks.  Remove anything that might make you trip as you walk through a door, such as a raised step or threshold.  Trim any bushes or trees on the path to your home.  Use bright outdoor lighting.  Clear any walking paths of anything that might make someone trip, such as rocks or tools.  Regularly check to see if handrails are loose or broken. Make sure that both sides of any steps have handrails.  Any raised decks and porches should have guardrails on the edges.  Have any leaves, snow, or ice cleared regularly.  Use sand or salt on walking paths during winter.  Clean up any spills in your garage right away. This includes oil or grease spills. What can I do in the bathroom?  Use night lights.  Install grab bars by the toilet and in the tub and shower. Do not use towel bars as grab bars.  Use non-skid mats or decals in the tub or shower.  If you need to sit down in the shower, use a plastic, non-slip stool.  Keep the floor dry. Clean up any water that spills on the floor as soon as it happens.  Remove soap buildup in the tub or shower regularly.  Attach bath mats securely with double-sided non-slip rug tape.  Do not have  throw rugs and other things on the floor that can make you trip. What can I do in the bedroom?  Use night lights.  Make sure that you have a light by your bed that is easy to reach.  Do not use any sheets or blankets that are too big for your bed. They should not hang down onto the floor.  Have a firm chair that has side arms. You can use this for support while you get dressed.  Do not have throw rugs and other things on the floor that can make you trip. What can I do in the kitchen?  Clean up any spills right away.  Avoid walking on wet floors.  Keep items that you use a lot in easy-to-reach places.  If you need to reach something above you, use a strong step stool that has a grab bar.  Keep electrical cords out of the way.  Do not use floor polish or wax that makes floors slippery. If you must use wax, use non-skid floor wax.  Do not have throw rugs and other things on the floor that can make you trip. What can I do  with my stairs?  Do not leave any items on the stairs.  Make sure that there are handrails on both sides of the stairs and use them. Fix handrails that are broken or loose. Make sure that handrails are as long as the stairways.  Check any carpeting to make sure that it is firmly attached to the stairs. Fix any carpet that is loose or worn.  Avoid having throw rugs at the top or bottom of the stairs. If you do have throw rugs, attach them to the floor with carpet tape.  Make sure that you have a light switch at the top of the stairs and the bottom of the stairs. If you do not have them, ask someone to add them for you. What else can I do to help prevent falls?  Wear shoes that:  Do not have high heels.  Have rubber bottoms.  Are comfortable and fit you well.  Are closed at the toe. Do not wear sandals.  If you use a stepladder:  Make sure that it is fully opened. Do not climb a closed stepladder.  Make sure that both sides of the stepladder are  locked into place.  Ask someone to hold it for you, if possible.  Clearly mark and make sure that you can see:  Any grab bars or handrails.  First and last steps.  Where the edge of each step is.  Use tools that help you move around (mobility aids) if they are needed. These include:  Canes.  Walkers.  Scooters.  Crutches.  Turn on the lights when you go into a dark area. Replace any light bulbs as soon as they burn out.  Set up your furniture so you have a clear path. Avoid moving your furniture around.  If any of your floors are uneven, fix them.  If there are any pets around you, be aware of where they are.  Review your medicines with your doctor. Some medicines can make you feel dizzy. This can increase your chance of falling. Ask your doctor what other things that you can do to help prevent falls. This information is not intended to replace advice given to you by your health care provider. Make sure you discuss any questions you have with your health care provider. Document Released: 10/24/2008 Document Revised: 06/05/2015 Document Reviewed: 02/01/2014 Elsevier Interactive Patient Education  2017 [employer].

## 2020-01-03 DIAGNOSIS — E1122 Type 2 diabetes mellitus with diabetic chronic kidney disease: Secondary | ICD-10-CM | POA: Diagnosis not present

## 2020-01-03 DIAGNOSIS — I10 Essential (primary) hypertension: Secondary | ICD-10-CM | POA: Diagnosis not present

## 2020-01-03 LAB — LIPID PANEL
Cholesterol: 230 — AB (ref 0–200)
HDL: 68 (ref 35–70)
LDL Cholesterol: 131
Triglycerides: 156 (ref 40–160)

## 2020-01-03 LAB — HEMOGLOBIN A1C: Hemoglobin A1C: 6.3

## 2020-01-09 ENCOUNTER — Encounter: Payer: Self-pay | Admitting: Internal Medicine

## 2020-01-09 ENCOUNTER — Non-Acute Institutional Stay: Payer: Medicare Other | Admitting: Internal Medicine

## 2020-01-09 ENCOUNTER — Other Ambulatory Visit: Payer: Self-pay

## 2020-01-09 VITALS — BP 118/64 | HR 95 | Temp 98.1°F | Ht 68.0 in | Wt 172.1 lb

## 2020-01-09 DIAGNOSIS — I10 Essential (primary) hypertension: Secondary | ICD-10-CM

## 2020-01-09 DIAGNOSIS — E1122 Type 2 diabetes mellitus with diabetic chronic kidney disease: Secondary | ICD-10-CM

## 2020-01-09 DIAGNOSIS — R413 Other amnesia: Secondary | ICD-10-CM | POA: Diagnosis not present

## 2020-01-09 DIAGNOSIS — E7800 Pure hypercholesterolemia, unspecified: Secondary | ICD-10-CM | POA: Diagnosis not present

## 2020-01-09 DIAGNOSIS — N1831 Chronic kidney disease, stage 3a: Secondary | ICD-10-CM | POA: Diagnosis not present

## 2020-01-09 DIAGNOSIS — M5030 Other cervical disc degeneration, unspecified cervical region: Secondary | ICD-10-CM | POA: Diagnosis not present

## 2020-01-09 DIAGNOSIS — I5032 Chronic diastolic (congestive) heart failure: Secondary | ICD-10-CM

## 2020-01-09 MED ORDER — ATORVASTATIN CALCIUM 40 MG PO TABS: 40.0000 mg | ORAL_TABLET | Freq: Every day | ORAL | 3 refills | Status: DC

## 2020-01-09 NOTE — Progress Notes (Signed)
Location:  [facility] clinic Provider:  Artia Singley L. Mariea Clonts, D.O., C.M.D.  Code Status: DNR Goals of Care:  Advanced Directives 01/09/2020  Does Patient Have a Medical Advance Directive? Yes  Type of Advance Directive Living will;Healthcare Power of Attorney  Does patient want to make changes to medical advance directive? No - Patient declined  Copy of [place] in Chart? Yes - validated most recent copy scanned in chart (See row information)  Would patient like information on creating a medical advance directive? -  Pre-existing out of facility DNR order (yellow form or pink MOST form) -     Chief Complaint  Patient presents with  . Medical Management of Chronic Issues    4 month follow up/ runny nose and coughing     HPI: Patient is a 86 y.o. female seen today for medical management of chronic diseases.    Her nose runs a lot--for 6 months now she says.  She has drainage.  It's clear.  No fever, chills, sore throat.  Takes zyrtec with her evening meal.  Would forget it at bedtime.    She will eat a muffin occasionally and ice cream.  LDL has gone to 131 from 90.  Notes she eats butter, cream cheese.  Reviewed items high in cholesterol.  She now has two children in medical school--one in [place] and one in [place].  [place] one may go into surgery.  She does not recall the school in [place].    Neck pain still there, no better.  Heating pad helps a lot.  Massages help a lot.    Past Medical History:  Diagnosis Date  . Childhood asthma   . Chronic diastolic heart failure [facility]) 12/25/2012   October 2013 echocardiogram 1. There is moderate concentric left ventricle hypertrophy. 2. Left ventricular ejection fraction estimated by 2D at 60-65 percent. 3. There were no regional wall motion abnormalities. 4. Mild mitral annular calcification. 5. Mild mitral valve regurgitation. 6. There is mild tricuspid regurgitation. 7. Mildly elevated estimated right ventricular systolic  pressure. 8. Right ventricular systolic pressure estimated at 35-40 mm Hg. 9. Mild calcification of the aortic valve. 10. Trace aortic valve regurgitation. 11. Analysis of mitral valve inflow, pulmonary vein Doppler and tissue Doppler suggests grade I diastolic dysfunction without elevated left atrial pressure.   . Colon polyp   . Diverticular stricture ([place]) 2009  . Diverticulosis 10-30-2007  . Edema 09/12/13  . Fall 05/18/2017   tripped over wheel of walker; superior and inferior pubic ramus fractures   . Hypercholesterolemia   . Hypertension   . Hypertension, essential   . Hypothyroidism    as a teenager  . Indigestion   . Insomnia   . OA (osteoarthritis) of hip    right hip replacement, OA in bilateral knees  . Osteopenia    Actonel stopped in 2012 after 5years therapy  . Pneumonia 1939   "hospitalized"  . Rapid resting heart rate 17/00/1749   Uncertain cause. Possible ectopic atrial tachycardia. Date entered 12/25/12   . Right rotator cuff tear   . Seasonal allergies     Past Surgical History:  Procedure Laterality Date  . CATARACT EXTRACTION W/ INTRAOCULAR LENS  IMPLANT, BILATERAL Bilateral   . COLONOSCOPY  2009   Dr. [facility] [employer]  . DILATION AND CURETTAGE OF UTERUS    . FRACTURE SURGERY    . JOINT REPLACEMENT    . ORIF WRIST FRACTURE Right   . REDUCTION MAMMAPLASTY  1999  . TOTAL  HIP ARTHROPLASTY Right 03-04-08   Dr. Wynelle Link    Allergies  Allergen Reactions  . Amlodipine Besylate Other (See Comments)    Edema  . Lisinopril Cough    Cough   . Sulfamethoxazole-Trimethoprim Other (See Comments)    Unknown reaction     Outpatient Encounter Medications as of 01/09/2020  Medication Sig  . acetaminophen (TYLENOL) 500 MG tablet Take 1 tablet (500 mg total) by mouth daily.  Marland Kitchen alendronate (FOSAMAX) 70 MG tablet TAKE 1 TAB ONCE A WEEK, AT LEAST 30 MIN BEFORE 1ST FOOD.DO NOT LIE DOWN FOR 30 MIN AFTER TAKING.  Marland Kitchen aspirin EC 81 MG tablet Take 81 mg by mouth daily.  Marland Kitchen  atorvastatin (LIPITOR) 20 MG tablet Take 1 tablet (20 mg total) by mouth daily.  . cetirizine (ZYRTEC) 10 MG tablet Take 1 tablet (10 mg total) by mouth at bedtime.  . Cholecalciferol (VITAMIN D PO) Take 1 tablet by mouth every 7 (seven) days. 50,000 units  . diclofenac sodium (VOLTAREN) 1 % GEL Apply 2 g topically every 6 (six) hours as needed.  . donepezil (ARICEPT) 10 MG tablet TAKE ONE TABLET AT BEDTIME.  . famotidine (PEPCID) 20 MG tablet Take 20 mg by mouth daily.  . hydrochlorothiazide (HYDRODIURIL) 25 MG tablet TAKE 1 TABLET BY MOUTH DAILY. TAKE WITH LOSARTAN  . HYDROcodone-Chlorpheniramine 5-4 MG/5ML SOLN Take 5 mLs by mouth at bedtime as needed (cough).  . hydrocortisone 2.5 % cream   . hydrocortisone 2.5 % cream hydrocortisone 2.5 % topical cream  . ketoconazole (NIZORAL) 2 % cream   . losartan (COZAAR) 100 MG tablet TAKE 1 TABLET BY MOUTH DAILY. TAKE WITH HCTZ  . losartan-hydrochlorothiazide (HYZAAR) 100-25 MG tablet TAKE 1 TABLET EACH DAY.  . memantine (NAMENDA TITRATION PAK) tablet pack 5 mg/day for =1 week; 5 mg twice daily for =1 week; 15 mg/day given in 5 mg and 10 mg separated doses for =1 week; then 10 mg twice daily  . metFORMIN (GLUCOPHAGE) 500 MG tablet TAKE (1/2) TABLET DAILY.  . metoprolol succinate (TOPROL-XL) 100 MG 24 hr tablet TAKE 1&1/2 TABLETS TWICE DAILY.  Marland Kitchen nystatin cream (MYCOSTATIN) APPLY TO SKIN TWICE DAILY AS DIRECTED.  Marland Kitchen polyethylene glycol powder (GLYCOLAX/MIRALAX) 17 GM/SCOOP powder polyethylene glycol 3350 17 gram oral powder packet   17 g by oral route.  . vitamin B-12 (CYANOCOBALAMIN) 1000 MCG tablet Take 1 tablet (1,000 mcg total) by mouth daily.   No facility-administered encounter medications on file as of 01/09/2020.    Review of Systems:  Review of Systems  Constitutional: Negative for chills and fever.  HENT: Positive for hearing loss. Negative for congestion and sore throat.   Eyes: Negative for blurred vision.  Respiratory: Negative for  cough and shortness of breath.   Cardiovascular: Negative for chest pain, palpitations and leg swelling.  Gastrointestinal: Negative for abdominal pain, blood in stool, constipation and melena.  Genitourinary: Negative for dysuria.  Musculoskeletal: Positive for myalgias and neck pain. Negative for falls and joint pain.       Walks with rollator  Skin: Negative for itching and rash.  Neurological: Negative for dizziness and loss of consciousness.  Endo/Heme/Allergies: Does not bruise/bleed easily.  Psychiatric/Behavioral: Positive for memory loss. Negative for depression. The patient is not nervous/anxious and does not have insomnia.     Health Maintenance  Topic Date Due  . FOOT EXAM  12/13/2019  . HEMOGLOBIN A1C  03/01/2020  . OPHTHALMOLOGY EXAM  07/11/2020  . TETANUS/TDAP  07/21/2027  . INFLUENZA VACCINE  Completed  . DEXA SCAN  Completed  . COVID-19 Vaccine  Completed  . PNA vac Low Risk Adult  Completed    Physical Exam: Vitals:   01/09/20 0923  BP: 118/64  Pulse: 95  Temp: 98.1 F (36.7 C)  TempSrc: Temporal  SpO2: 100%  Weight: 172 lb 1.6 oz (78.1 kg)  Height: 5\' 8"  (1.727 m)   Body mass index is 26.17 kg/m. Physical Exam Vitals reviewed.  Constitutional:      General: She is not in acute distress.    Appearance: Normal appearance. She is not ill-appearing or toxic-appearing.  HENT:     Head: Normocephalic and atraumatic.     Ears:     Comments: HOH Eyes:     Extraocular Movements: Extraocular movements intact.     Conjunctiva/sclera: Conjunctivae normal.     Pupils: Pupils are equal, round, and reactive to light.     Comments: glasses  Cardiovascular:     Rate and Rhythm: Normal rate and regular rhythm.     Heart sounds: No murmur heard.   Pulmonary:     Effort: Pulmonary effort is normal.     Breath sounds: Normal breath sounds. No wheezing, rhonchi or rales.  Abdominal:     General: Bowel sounds are normal. There is no distension.      Palpations: Abdomen is soft.     Tenderness: There is no abdominal tenderness. There is no guarding or rebound.  Musculoskeletal:        General: Normal range of motion.     Right lower leg: No edema.     Left lower leg: No edema.     Comments: Kyphosis; cervical paravertebral and trapezius tenderness; walks with rollator walker  Skin:    General: Skin is warm and dry.  Neurological:     General: No focal deficit present.     Mental Status: She is alert.     Gait: Gait abnormal.     Comments: Oriented to person, place, time, but does not recall meds well; Diabetic foot exam was performed with the following findings:   No deformities, ulcerations, or other skin breakdown Intact posterior tibialis and dorsalis pedis pulses Sensation intact except right great toe (does have callouses)     Psychiatric:        Mood and Affect: Mood normal.     Labs reviewed: Basic Metabolic Panel: Recent Labs    08/30/19 0000  NA 140  K 4.5  CL 100  CO2 25*  BUN 27*  CREATININE 1.1  CALCIUM 9.9   Liver Function Tests: Recent Labs    08/30/19 0000  AST 15  ALT 10  ALBUMIN 4.0   No results for input(s): LIPASE, AMYLASE in the last 8760 hours. No results for input(s): AMMONIA in the last 8760 hours. CBC: Recent Labs    08/30/19 0000  WBC 7.8  HGB 13.5  HCT 42  PLT 198   Lipid Panel: Recent Labs    08/30/19 0000  CHOL 179  HDL 57  LDLCALC 90  TRIG 157   Lab Results  Component Value Date   HGBA1C 6.4 08/30/2019    Procedures since last visit: No results found.  Assessment/Plan 1. Hypercholesterolemia - LDL trended up so increase lipitor and she's going to try to reduce cream cheese, butter, ice cream - atorvastatin (LIPITOR) 40 MG tablet; Take 1 tablet (40 mg total) by mouth daily.  Dispense: 90 tablet; Refill: 3  2. Memory loss, short term -progressing--has mild dementia -  appears she is reaching a point where she may need cues to take meds -she is writing  herself notes all the time to remind her of things, has caregivers to assist -resident is taking her namenda 10mg  po bid as directed  3. Chronic diastolic heart failure (HCC) -no active symptoms, doing well and euvolemic  4. Type 2 diabetes mellitus with stage 3a chronic kidney disease, without long-term current use of insulin (HCC) -in prediabetic range on metformin, doing fine here with slight improvement in hba1c  5. Essential hypertension -bp at goal with current regimen, cont same and monitor  6. DDD (degenerative disc disease), cervical -ongoing discomfort, cont tylenol, topicals, heat and massage   Labs/tests ordered:  Left w/o scheduling Next appt:  Left w/o scheduling  Shanique Aslinger L. Giovonni Poirier, D.O. [facility] [address], [place] [address] Cell Phone (Mon-Fri 8am-5pm):  [phone] On Call:  [phone] & follow prompts after 5pm & weekends Office Phone:  [phone] Office Fax:  [phone]

## 2020-01-23 ENCOUNTER — Other Ambulatory Visit: Payer: Self-pay | Admitting: Interventional Cardiology

## 2020-03-03 ENCOUNTER — Encounter: Payer: Self-pay | Admitting: Internal Medicine

## 2020-03-11 ENCOUNTER — Other Ambulatory Visit: Payer: Self-pay | Admitting: Internal Medicine

## 2020-03-11 DIAGNOSIS — R413 Other amnesia: Secondary | ICD-10-CM

## 2020-04-09 ENCOUNTER — Other Ambulatory Visit: Payer: Self-pay | Admitting: Interventional Cardiology

## 2020-04-09 ENCOUNTER — Encounter: Payer: Self-pay | Admitting: Internal Medicine

## 2020-04-13 NOTE — Progress Notes (Signed)
Cardiology Office Note:    Date:  04/14/2020   ID:  Kelsey Salinas, DOB 12-26-1926, MRN [id number]  PCP:  Virgie Dad, MD  Cardiologist:  Sinclair Grooms, MD   Referring MD: Gayland Curry, DO   Chief Complaint  Patient presents with  . Congestive Heart Failure  . Hypertension    History of Present Illness:    Kelsey Salinas is a 87 y.o. female with a hx of chronicdiastolic heart failure, chronic sinus tachycardia, and hypertension.  She says that she knew me before I was married.  She then says that she remembers my children when they were small.  She voices no cardiac complaints.  She wants metoprolol refilled and the third of the medications cut in half since she takes 1-1/2 tablets twice a day of a 100 mg Toprol-XL tablet.  She is accompanied by a Nurse, learning disability.  She ambulates with a walker.  She had one fall without significant injury.  This was greater than a year ago.  Past Medical History:  Diagnosis Date  . Childhood asthma   . Chronic diastolic heart failure [facility]) 12/25/2012   October 2013 echocardiogram 1. There is moderate concentric left ventricle hypertrophy. 2. Left ventricular ejection fraction estimated by 2D at 60-65 percent. 3. There were no regional wall motion abnormalities. 4. Mild mitral annular calcification. 5. Mild mitral valve regurgitation. 6. There is mild tricuspid regurgitation. 7. Mildly elevated estimated right ventricular systolic pressure. 8. Right ventricular systolic pressure estimated at 35-40 mm Hg. 9. Mild calcification of the aortic valve. 10. Trace aortic valve regurgitation. 11. Analysis of mitral valve inflow, pulmonary vein Doppler and tissue Doppler suggests grade I diastolic dysfunction without elevated left atrial pressure.   . Colon polyp   . Diverticular stricture ([place]) 2009  . Diverticulosis 10-30-2007  . Edema 09/12/13  . Fall 05/18/2017   tripped over wheel of walker; superior and inferior pubic ramus fractures    . Hypercholesterolemia   . Hypertension   . Hypertension, essential   . Hypothyroidism    as a teenager  . Indigestion   . Insomnia   . OA (osteoarthritis) of hip    right hip replacement, OA in bilateral knees  . Osteopenia    Actonel stopped in 2012 after 5years therapy  . Pneumonia 1939   "hospitalized"  . Rapid resting heart rate 90/24/0973   Uncertain cause. Possible ectopic atrial tachycardia. Date entered 12/25/12   . Right rotator cuff tear   . Seasonal allergies     Past Surgical History:  Procedure Laterality Date  . CATARACT EXTRACTION W/ INTRAOCULAR LENS  IMPLANT, BILATERAL Bilateral   . COLONOSCOPY  2009   Dr. Redmond [facility]  . DILATION AND CURETTAGE OF UTERUS    . FRACTURE SURGERY    . JOINT REPLACEMENT    . ORIF WRIST FRACTURE Right   . REDUCTION MAMMAPLASTY  1999  . TOTAL HIP ARTHROPLASTY Right 03-04-08   Dr. Wynelle Link    Current Medications: Current Meds  Medication Sig  . acetaminophen (TYLENOL) 500 MG tablet Take 1 tablet (500 mg total) by mouth daily.  Marland Kitchen alendronate (FOSAMAX) 70 MG tablet TAKE 1 TAB ONCE A WEEK, AT LEAST 30 MIN BEFORE 1ST FOOD.DO NOT LIE DOWN FOR 30 MIN AFTER TAKING.  Marland Kitchen aspirin EC 81 MG tablet Take 81 mg by mouth daily.  Marland Kitchen atorvastatin (LIPITOR) 40 MG tablet Take 1 tablet (40 mg total) by mouth daily.  . cetirizine (ZYRTEC) 10 MG tablet Take 1 tablet (  10 mg total) by mouth at bedtime.  . Cholecalciferol (VITAMIN D PO) Take 1 tablet by mouth every 7 (seven) days. 50,000 units  . diclofenac sodium (VOLTAREN) 1 % GEL Apply 2 g topically every 6 (six) hours as needed.  . donepezil (ARICEPT) 10 MG tablet TAKE ONE TABLET AT BEDTIME.  . famotidine (PEPCID) 20 MG tablet Take 20 mg by mouth daily.  . hydrochlorothiazide (HYDRODIURIL) 25 MG tablet TAKE 1 TABLET BY MOUTH DAILY. TAKE WITH LOSARTAN  . HYDROcodone-Chlorpheniramine 5-4 MG/5ML SOLN Take 5 mLs by mouth at bedtime as needed (cough).  . hydrocortisone 2.5 % cream   . ketoconazole (NIZORAL) 2  % cream   . losartan-hydrochlorothiazide (HYZAAR) 100-25 MG tablet TAKE 1 TABLET EACH DAY.  . memantine (NAMENDA) 10 MG tablet Take 10 mg by mouth 2 (two) times daily.  . metFORMIN (GLUCOPHAGE) 500 MG tablet TAKE (1/2) TABLET DAILY.  Marland Kitchen nystatin cream (MYCOSTATIN) APPLY TO SKIN TWICE DAILY AS DIRECTED.  Marland Kitchen polyethylene glycol powder (GLYCOLAX/MIRALAX) 17 GM/SCOOP powder polyethylene glycol 3350 17 gram oral powder packet   17 g by oral route.  . vitamin B-12 (CYANOCOBALAMIN) 1000 MCG tablet Take 1 tablet (1,000 mcg total) by mouth daily.  . [DISCONTINUED] hydrocortisone 2.5 % cream hydrocortisone 2.5 % topical cream  . [DISCONTINUED] losartan (COZAAR) 100 MG tablet TAKE 1 TABLET BY MOUTH DAILY. TAKE WITH HCTZ  . [DISCONTINUED] metoprolol succinate (TOPROL-XL) 100 MG 24 hr tablet TAKE 1&1/2 TABLETS TWICE DAILY. Please keep upcoming appt in April 2022 with Dr. Tamala Julian before anymore refills. Thank you     Allergies:   Amlodipine besylate, Lisinopril, and Sulfamethoxazole-trimethoprim   Social History   Socioeconomic History  . Marital status: Married    Spouse name: Not on file  . Number of children: Not on file  . Years of education: Not on file  . Highest education level: Not on file  Occupational History  . Occupation: retired travel Music therapist  Tobacco Use  . Smoking status: Former Smoker    Packs/day: 0.12    Years: 50.00    Pack years: 6.00    Types: Cigarettes  . Smokeless tobacco: Never Used  . Tobacco comment: "stopped in the ate 1990s"  Vaping Use  . Vaping Use: Never used  Substance and Sexual Activity  . Alcohol use: Yes    Comment: rarely  . Drug use: Never  . Sexual activity: Not Currently  Other Topics Concern  . Not on file  Social History Narrative   Lives at [place] since 2010   Husband Ilona Sorrel married 1952 (husband in memory care unit at [employer])   Former smoker 1/2 PPD, for 30 years, stopped 01/10/1989   Alcohol 2 glasses of wine at night   Exercise:  machines 3 times a week and daily walk   No POA/LW         Social Determinants of Health   Financial Resource Strain: Not on file  Food Insecurity: Not on file  Transportation Needs: Not on file  Physical Activity: Not on file  Stress: Not on file  Social Connections: Not on file     Family History: The patient's family history includes Heart disease in her brother, father, and mother.  ROS:   Please see the history of present illness.    Decreased memory.  Happy that her older son who is a cardiologist will be relocating from [place] to [place] when he retires.  He is in his mid 44s.  All other systems reviewed and  are negative.  EKGs/Labs/Other Studies Reviewed:    The following studies were reviewed today: No new data  EKG:  EKG normal sinus rhythm/sinus tachycardia with PACs and uniform PVCs and a brief episode of ventricular quadrigeminy.  When compared to the prior tracing from November 2020, the heart rate is faster and the PVCs are new  Recent Labs: 08/30/2019: ALT 10; BUN 27; Creatinine 1.1; Hemoglobin 13.5; Platelets 198; Potassium 4.5; Sodium 140  Recent Lipid Panel    Component Value Date/Time   CHOL 230 (A) 01/03/2020 0000   TRIG 156 01/03/2020 0000   HDL 68 01/03/2020 0000   LDLCALC 131 01/03/2020 0000    Physical Exam:    VS:  BP 120/80   Pulse (!) 108   Ht 5\' 8"  (1.727 m)   Wt 179 lb (81.2 kg)   SpO2 97%   BMI 27.22 kg/m     Wt Readings from Last 3 Encounters:  04/14/20 179 lb (81.2 kg)  01/09/20 172 lb 1.6 oz (78.1 kg)  09/05/19 172 lb (78 kg)     GEN: Elderly but not frail. No acute distress HEENT: Normal NECK: No JVD. LYMPHATICS: No lymphadenopathy CARDIAC: No murmur. RRR no gallop, or edema. VASCULAR:  Normal Pulses. No bruits. RESPIRATORY:  Clear to auscultation without rales, wheezing or rhonchi  ABDOMEN: Soft, non-tender, non-distended, No pulsatile mass, MUSCULOSKELETAL: No deformity  SKIN: Warm and dry NEUROLOGIC:  Alert  and oriented x 3 PSYCHIATRIC:  Normal affect   ASSESSMENT:    1. Chronic diastolic heart failure ([place])   2. Essential hypertension   3. Stage 3a chronic kidney disease ([place])   4. Hypercholesterolemia    PLAN:    In order of problems listed above:  1. No clinical evidence of volume overload.  No JVD.  No ankle edema and lung fields are clear.  Continue beta-blocker therapy, currently on Toprol-XL 150 mg twice daily. 2. Excellent blood pressure control.  Continue Toprol, Hyzaar 100/25 mg a day. 3. Most recent kidney function test was normal with a creatinine of 1.1. 4. Lipids are extremely elevated despite high intensity statin therapy.  Will not make a change at her age.  Plan clinical follow-up in 1 year.  Refill metoprolol succinate.   Medication Adjustments/Labs and Tests Ordered: Current medicines are reviewed at length with the patient today.  Concerns regarding medicines are outlined above.  Orders Placed This Encounter  Procedures  . EKG 12-Lead   Meds ordered this encounter  Medications  . metoprolol succinate (TOPROL-XL) 100 MG 24 hr tablet    Sig: TAKE 1&1/2 TABLETS TWICE DAILY.    Dispense:  270 tablet    Refill:  0    Pt would like a third of the pills split if possible    Patient Instructions  Medication Instructions:  Your physician recommends that you continue on your current medications as directed. Please refer to the Current Medication list given to you today.  *If you need a refill on your cardiac medications before your next appointment, please call your pharmacy*   Lab Work: None If you have labs (blood work) drawn today and your tests are completely normal, you will receive your results only by: Marland Kitchen MyChart Message (if you have MyChart) OR . A paper copy in the mail If you have any lab test that is abnormal or we need to change your treatment, we will call you to review the results.   Testing/Procedures: None   Follow-Up: At [facility],  you and  your health needs are our priority.  As part of our continuing mission to provide you with exceptional heart care, we have created designated Provider Care Teams.  These Care Teams include your primary Cardiologist (physician) and Advanced Practice Providers (APPs -  Physician Assistants and Nurse Practitioners) who all work together to provide you with the care you need, when you need it.  We recommend signing up for the patient portal called "MyChart".  Sign up information is provided on this After Visit Summary.  MyChart is used to connect with patients for Virtual Visits (Telemedicine).  Patients are able to view lab/test results, encounter notes, upcoming appointments, etc.  Non-urgent messages can be sent to your provider as well.   To learn more about what you can do with MyChart, go to [email or web address].    Your next appointment:   1 year(s)  The format for your next appointment:   In Person  Provider:   You may see Sinclair Grooms, MD or one of the following Advanced Practice Providers on your designated Care Team:    Kathyrn Drown, NP    Other Instructions      Signed, Sinclair Grooms, MD  04/14/2020 11:38 AM    [place]

## 2020-04-14 ENCOUNTER — Encounter: Payer: Self-pay | Admitting: Interventional Cardiology

## 2020-04-14 ENCOUNTER — Other Ambulatory Visit: Payer: Self-pay

## 2020-04-14 ENCOUNTER — Ambulatory Visit (INDEPENDENT_AMBULATORY_CARE_PROVIDER_SITE_OTHER): Payer: Medicare Other | Admitting: Interventional Cardiology

## 2020-04-14 VITALS — BP 120/80 | HR 108 | Ht 68.0 in | Wt 179.0 lb

## 2020-04-14 DIAGNOSIS — E7800 Pure hypercholesterolemia, unspecified: Secondary | ICD-10-CM

## 2020-04-14 DIAGNOSIS — I5032 Chronic diastolic (congestive) heart failure: Secondary | ICD-10-CM | POA: Diagnosis not present

## 2020-04-14 DIAGNOSIS — N1831 Chronic kidney disease, stage 3a: Secondary | ICD-10-CM | POA: Diagnosis not present

## 2020-04-14 DIAGNOSIS — I10 Essential (primary) hypertension: Secondary | ICD-10-CM | POA: Diagnosis not present

## 2020-04-14 MED ORDER — METOPROLOL SUCCINATE ER 100 MG PO TB24: ORAL_TABLET | ORAL | 0 refills | Status: DC

## 2020-04-14 NOTE — Patient Instructions (Signed)

## 2020-04-23 ENCOUNTER — Non-Acute Institutional Stay: Payer: Medicare Other | Admitting: Internal Medicine

## 2020-04-23 ENCOUNTER — Other Ambulatory Visit: Payer: Self-pay

## 2020-04-23 ENCOUNTER — Encounter: Payer: Self-pay | Admitting: Internal Medicine

## 2020-04-23 VITALS — BP 136/76 | HR 100 | Temp 96.9°F | Ht 68.0 in | Wt 181.2 lb

## 2020-04-23 DIAGNOSIS — E1122 Type 2 diabetes mellitus with diabetic chronic kidney disease: Secondary | ICD-10-CM

## 2020-04-23 DIAGNOSIS — R413 Other amnesia: Secondary | ICD-10-CM | POA: Diagnosis not present

## 2020-04-23 DIAGNOSIS — I10 Essential (primary) hypertension: Secondary | ICD-10-CM | POA: Diagnosis not present

## 2020-04-23 DIAGNOSIS — N1831 Chronic kidney disease, stage 3a: Secondary | ICD-10-CM

## 2020-04-23 DIAGNOSIS — I5032 Chronic diastolic (congestive) heart failure: Secondary | ICD-10-CM

## 2020-04-23 DIAGNOSIS — E7800 Pure hypercholesterolemia, unspecified: Secondary | ICD-10-CM | POA: Diagnosis not present

## 2020-04-23 DIAGNOSIS — M5030 Other cervical disc degeneration, unspecified cervical region: Secondary | ICD-10-CM | POA: Diagnosis not present

## 2020-04-24 NOTE — Progress Notes (Signed)
Location:  [place] of Service:  Clinic (12)  Provider:   Code Status:  Goals of Care:  Advanced Directives 01/09/2020  Does Patient Have a Medical Advance Directive? Yes  Type of Advance Directive Living will;Healthcare Power of Attorney  Does patient want to make changes to medical advance directive? No - Patient declined  Copy of [place] in Chart? Yes - validated most recent copy scanned in chart (See row information)  Would patient like information on creating a medical advance directive? -  Pre-existing out of facility DNR order (yellow form or pink MOST form) -     Chief Complaint  Patient presents with  . Medical Management of Chronic Issues    Patient returns to the clinic for follow up.     HPI: Patient is a 87 y.o. female seen today for medical management of chronic diseases.    Patient is active issues Has history of Alzheimer's dementia. Do not see any recent Head  Imaging that she has been on Aricept and Namenda.  Dr. Mariea Clonts and has been tolerating it.  Patient states that she has noticed memory issues she has to make notes for herself to remember things.  She does have a caregiver who comes for couple of hours a day.  Helps her with going out. Patient walks with a walker but uses wheelchair for long distance. History of diastolic CHF Follows with cardiology did not make any recent change. Also has history of type 2 diabetes -HLD on statin Osteoarthritis.  Weight seems stable.  Does have some neck pain but usually relieved with heating pad and Tylenol. No other active issues.  States her son is cardiologist who is retiring and moving close to [employer] Past Medical History:  Diagnosis Date  . Childhood asthma   . Chronic diastolic heart failure [facility]) 12/25/2012   October 2013 echocardiogram 1. There is moderate concentric left ventricle hypertrophy. 2. Left ventricular ejection fraction estimated by 2D at  60-65 percent. 3. There were no regional wall motion abnormalities. 4. Mild mitral annular calcification. 5. Mild mitral valve regurgitation. 6. There is mild tricuspid regurgitation. 7. Mildly elevated estimated right ventricular systolic pressure. 8. Right ventricular systolic pressure estimated at 35-40 mm Hg. 9. Mild calcification of the aortic valve. 10. Trace aortic valve regurgitation. 11. Analysis of mitral valve inflow, pulmonary vein Doppler and tissue Doppler suggests grade I diastolic dysfunction without elevated left atrial pressure.   . Colon polyp   . Diverticular stricture ([place]) 2009  . Diverticulosis 10-30-2007  . Edema 09/12/13  . Fall 05/18/2017   tripped over wheel of walker; superior and inferior pubic ramus fractures   . Hypercholesterolemia   . Hypertension   . Hypertension, essential   . Hypothyroidism    as a teenager  . Indigestion   . Insomnia   . OA (osteoarthritis) of hip    right hip replacement, OA in bilateral knees  . Osteopenia    Actonel stopped in 2012 after 5years therapy  . Pneumonia 1939   "hospitalized"  . Rapid resting heart rate 17/49/4496   Uncertain cause. Possible ectopic atrial tachycardia. Date entered 12/25/12   . Right rotator cuff tear   . Seasonal allergies     Past Surgical History:  Procedure Laterality Date  . CATARACT EXTRACTION W/ INTRAOCULAR LENS  IMPLANT, BILATERAL Bilateral   . COLONOSCOPY  2009   Dr. Redmond School  . DILATION AND CURETTAGE OF UTERUS    .  FRACTURE SURGERY    . JOINT REPLACEMENT    . ORIF WRIST FRACTURE Right   . REDUCTION MAMMAPLASTY  1999  . TOTAL HIP ARTHROPLASTY Right 03-04-08   Dr. Wynelle Link    Allergies  Allergen Reactions  . Amlodipine Besylate Other (See Comments)    Edema  . Lisinopril Cough    Cough   . Sulfamethoxazole-Trimethoprim Other (See Comments)    Unknown reaction     Outpatient Encounter Medications as of 04/23/2020  Medication Sig  . acetaminophen (TYLENOL) 500 MG tablet Take 1  tablet (500 mg total) by mouth daily.  Marland Kitchen alendronate (FOSAMAX) 70 MG tablet TAKE 1 TAB ONCE A WEEK, AT LEAST 30 MIN BEFORE 1ST FOOD.DO NOT LIE DOWN FOR 30 MIN AFTER TAKING.  Marland Kitchen aspirin EC 81 MG tablet Take 81 mg by mouth daily.  Marland Kitchen atorvastatin (LIPITOR) 40 MG tablet Take 1 tablet (40 mg total) by mouth daily.  . cetirizine (ZYRTEC) 10 MG tablet Take 1 tablet (10 mg total) by mouth at bedtime.  . Cholecalciferol (VITAMIN D PO) Take 1 tablet by mouth every 7 (seven) days. 50,000 units  . diclofenac sodium (VOLTAREN) 1 % GEL Apply 2 g topically every 6 (six) hours as needed.  . donepezil (ARICEPT) 10 MG tablet TAKE ONE TABLET AT BEDTIME.  . famotidine (PEPCID) 20 MG tablet Take 20 mg by mouth daily.  . hydrochlorothiazide (HYDRODIURIL) 25 MG tablet TAKE 1 TABLET BY MOUTH DAILY. TAKE WITH LOSARTAN  . HYDROcodone-Chlorpheniramine 5-4 MG/5ML SOLN Take 5 mLs by mouth at bedtime as needed (cough).  . hydrocortisone 2.5 % cream   . losartan-hydrochlorothiazide (HYZAAR) 100-25 MG tablet TAKE 1 TABLET EACH DAY.  . memantine (NAMENDA) 10 MG tablet Take 10 mg by mouth 2 (two) times daily.  . metFORMIN (GLUCOPHAGE) 500 MG tablet TAKE (1/2) TABLET DAILY.  . metoprolol succinate (TOPROL-XL) 100 MG 24 hr tablet TAKE 1&1/2 TABLETS TWICE DAILY.  Marland Kitchen nystatin cream (MYCOSTATIN) APPLY TO SKIN TWICE DAILY AS DIRECTED.  Marland Kitchen polyethylene glycol powder (GLYCOLAX/MIRALAX) 17 GM/SCOOP powder polyethylene glycol 3350 17 gram oral powder packet   17 g by oral route.  . vitamin B-12 (CYANOCOBALAMIN) 1000 MCG tablet Take 1 tablet (1,000 mcg total) by mouth daily.  Marland Kitchen ketoconazole (NIZORAL) 2 % cream    No facility-administered encounter medications on file as of 04/23/2020.    Review of Systems:  Review of Systems  Constitutional: Negative.   HENT: Negative.   Respiratory: Negative.   Cardiovascular: Negative.   Gastrointestinal: Negative.   Genitourinary: Negative.   Musculoskeletal: Positive for arthralgias, back pain,  gait problem, myalgias and neck pain.  Skin: Negative.   Neurological: Negative for dizziness.  Psychiatric/Behavioral: Positive for confusion.    Health Maintenance  Topic Date Due  . HEMOGLOBIN A1C  07/03/2020  . OPHTHALMOLOGY EXAM  07/11/2020  . INFLUENZA VACCINE  08/11/2020  . FOOT EXAM  01/08/2021  . TETANUS/TDAP  07/21/2027  . DEXA SCAN  Completed  . COVID-19 Vaccine  Completed  . PNA vac Low Risk Adult  Completed  . HPV VACCINES  Aged Out    Physical Exam: Vitals:   04/23/20 1056  BP: 136/76  Pulse: 100  Temp: (!) 96.9 F (36.1 C)  SpO2: 97%  Weight: 181 lb 3.2 oz (82.2 kg)  Height: 5\' 8"  (1.727 m)   Body mass index is 27.55 kg/m. Physical Exam  Constitutional:  Well-developed and well-nourished.  HENT:  Head: Normocephalic.  Mouth/Throat: Oropharynx is clear and moist.  Eyes: Pupils  are equal, round, and reactive to light.  Neck: Neck supple.  Cardiovascular: Normal rate and normal heart sounds.  No murmur heard. Pulmonary/Chest: Effort normal and breath sounds normal. No respiratory distress. No wheezes. She has no rales.  Abdominal: Soft. Bowel sounds are normal. No distension. There is no tenderness. There is no rebound.  Musculoskeletal: No edema.  Lymphadenopathy: none Neurological:  No Focal Deficits Skin: Skin is warm and dry.  Psychiatric: Normal mood and affect. Behavior is normal. Thought content normal.    Labs reviewed: Basic Metabolic Panel: Recent Labs    08/30/19 0000  NA 140  K 4.5  CL 100  CO2 25*  BUN 27*  CREATININE 1.1  CALCIUM 9.9   Liver Function Tests: Recent Labs    08/30/19 0000  AST 15  ALT 10  ALBUMIN 4.0   No results for input(s): LIPASE, AMYLASE in the last 8760 hours. No results for input(s): AMMONIA in the last 8760 hours. CBC: Recent Labs    08/30/19 0000  WBC 7.8  HGB 13.5  HCT 42  PLT 198   Lipid Panel: Recent Labs    08/30/19 0000 01/03/20 0000  CHOL 179 230*  HDL 57 68  LDLCALC 90 131   TRIG 157 156   Lab Results  Component Value Date   HGBA1C 6.3 01/03/2020    Procedures since last visit: No results found.  Assessment/Plan 1. Type 2 diabetes mellitus with stage 3a chronic kidney disease, without long-term current use of insulin (HCC) On Metformin Repeat A1C before next visit Foot exam done by Dr Mariea Clonts in 12/21  2. Memory loss, short term On Both Aricept and Namenda Last MMSE was 4/21 and it was 29/30 She says she leaves Notes for herself everywhere to help her remembering Repeat MMSE next visit 3. Chronic diastolic heart failure ([place]) Was seen by Cardiology No new Changes in her Meds  4. Essential hypertension Stable on Toprol and Hyzaar Repeat Labs  5. DDD (degenerative disc disease), cervical Tylenol PRN 6. Stage 3a chronic kidney disease (HCC) Repeat BMP to follow Creat  7. Hypercholesterolemia On High dose of statin Repeat Lipids in few months No Aggressive therapy due to her Age  62 Osteoporosis On Fosamax Is scheduled for repeat DEXA in 6/22   Labs/tests ordered:  * No order type specified * Next appt:  07/23/2020

## 2020-05-25 DIAGNOSIS — Z20822 Contact with and (suspected) exposure to covid-19: Secondary | ICD-10-CM | POA: Diagnosis not present

## 2020-06-02 ENCOUNTER — Other Ambulatory Visit: Payer: Self-pay

## 2020-06-02 ENCOUNTER — Non-Acute Institutional Stay: Payer: Medicare Other | Admitting: Adult Health

## 2020-06-02 ENCOUNTER — Encounter: Payer: Self-pay | Admitting: Adult Health

## 2020-06-02 VITALS — BP 146/92 | HR 77 | Temp 96.3°F | Ht 68.0 in | Wt 181.2 lb

## 2020-06-02 DIAGNOSIS — R059 Cough, unspecified: Secondary | ICD-10-CM | POA: Diagnosis not present

## 2020-06-02 DIAGNOSIS — J302 Other seasonal allergic rhinitis: Secondary | ICD-10-CM

## 2020-06-02 MED ORDER — DOXYCYCLINE HYCLATE 100 MG PO TABS: 100.0000 mg | ORAL_TABLET | Freq: Two times a day (BID) | ORAL | 0 refills | Status: DC

## 2020-06-02 MED ORDER — FEXOFENADINE HCL 180 MG PO TABS: 180.0000 mg | ORAL_TABLET | Freq: Every day | ORAL | 2 refills | Status: AC

## 2020-06-02 MED ORDER — TRIAMCINOLONE ACETONIDE 55 MCG/ACT NA AERO: 2.0000 | INHALATION_SPRAY | Freq: Every day | NASAL | 12 refills | Status: DC

## 2020-06-02 MED ORDER — SALINE SPRAY 0.65 % NA SOLN: 1.0000 | NASAL | 0 refills | Status: DC | PRN

## 2020-06-02 MED ORDER — FEXOFENADINE HCL 180 MG PO TABS: 180.0000 mg | ORAL_TABLET | Freq: Every day | ORAL | 0 refills | Status: DC

## 2020-06-02 MED ORDER — FEXOFENADINE HCL 180 MG PO TABS: 180.0000 mg | ORAL_TABLET | Freq: Every day | ORAL | 2 refills | Status: DC

## 2020-06-02 NOTE — Progress Notes (Signed)
Location:  [place] [facility] Service:  Clinic 12  Provider: Royal Hawthorn NP  Goals of Care:  Advanced Directives 01/09/2020  Does Patient Have a Medical Advance Directive? Yes  Type of Advance Directive Living will;Healthcare Power of Attorney  Does patient want to make changes to medical advance directive? No - Patient declined  Copy of [place] in Chart? Yes - validated most recent copy scanned in chart (See row information)  Would patient like information on creating a medical advance directive? -  Pre-existing out of facility DNR order (yellow form or pink MOST form) -       Chief Complaint  Patient presents with  . Acute Visit    Patient here today due to a cough she has had for month.       HPI: Patient is a 87 y.o. female seen today for an acute visit for a cough.  Former smoker for 50 years, also with prior hx of seasonal allegies, rapid HR, CHF, DM II, OP, CKD, OA, HTN, knee pain, B12 deficiency. [facility]cough with neg PCR 05/25/20.  Last CXR in our system 05/06/16 which showed no acute abnormality with well aerated lungs. She reports nasal congestion, runny nose, cough and sneeze. The nasal symptoms have been present for "years". The cough has been present for 1 month but has been an issue in the past in the spring time. She is not sure if she is taking flonase but thinks she is doing so. We are not sure if she is taking zyrtec. She has some memory issues and has a home care worker in the morning to remind her to take pills.     Past Medical History:  Diagnosis Date  . Childhood asthma   . Chronic diastolic heart failure [facility]) 12/25/2012   October 2013 echocardiogram 1. There is moderate concentric left ventricle hypertrophy. 2. Left ventricular ejection fraction estimated by 2D at 60-65 percent. 3. There were no regional wall motion abnormalities. 4. Mild mitral annular  calcification. 5. Mild mitral valve regurgitation. 6. There is mild tricuspid regurgitation. 7. Mildly elevated estimated right ventricular systolic pressure. 8. Right ventricular systolic pressure estimated at 35-40 mm Hg. 9. Mild calcification of the aortic valve. 10. Trace aortic valve regurgitation. 11. Analysis of mitral valve inflow, pulmonary vein Doppler and tissue Doppler suggests grade I diastolic dysfunction without elevated left atrial pressure.   . Colon polyp   . Diverticular stricture ([place]) 2009  . Diverticulosis 10-30-2007  . Edema 09/12/13  . Fall 05/18/2017   tripped over wheel of walker; superior and inferior pubic ramus fractures   . Hypercholesterolemia   . Hypertension   . Hypertension, essential   . Hypothyroidism    as a teenager  . Indigestion   . Insomnia   . OA (osteoarthritis) of hip    right hip replacement, OA in bilateral knees  . Osteopenia    Actonel stopped in 2012 after 5years therapy  . Pneumonia 1939   "hospitalized"  . Rapid resting heart rate 81/01/7508   Uncertain cause. Possible ectopic atrial tachycardia. Date entered 12/25/12   . Right rotator cuff tear   . Seasonal allergies       Past Surgical History:  Procedure Laterality Date  . CATARACT EXTRACTION W/ INTRAOCULAR LENS  IMPLANT, BILATERAL Bilateral   . COLONOSCOPY  2009   Dr. Redmond [facility]  . DILATION AND CURETTAGE OF UTERUS    .  FRACTURE SURGERY    . JOINT REPLACEMENT    . ORIF WRIST FRACTURE Right   . REDUCTION MAMMAPLASTY  1999  . TOTAL HIP ARTHROPLASTY Right 03-04-08   Dr. Wynelle Link      Allergies  Allergen Reactions  . Amlodipine Besylate Other (See Comments)    Edema  . Lisinopril Cough    Cough   . Sulfamethoxazole-Trimethoprim Other (See Comments)    Unknown reaction       Outpatient Encounter Medications as of 06/02/2020  Medication Sig  . acetaminophen (TYLENOL) 500 MG tablet Take 1 tablet (500 mg total) by mouth daily.  Marland Kitchen alendronate (FOSAMAX) 70 MG tablet TAKE 1  TAB ONCE A WEEK, AT LEAST 30 MIN BEFORE 1ST FOOD.DO NOT LIE DOWN FOR 30 MIN AFTER TAKING.  Marland Kitchen aspirin EC 81 MG tablet Take 81 mg by mouth daily.  Marland Kitchen atorvastatin (LIPITOR) 40 MG tablet Take 1 tablet (40 mg total) by mouth daily.  . Cholecalciferol (VITAMIN D PO) Take 1 tablet by mouth every 7 (seven) days. 50,000 units  . diclofenac sodium (VOLTAREN) 1 % GEL Apply 2 g topically every 6 (six) hours as needed.  . donepezil (ARICEPT) 10 MG tablet TAKE ONE TABLET AT BEDTIME.  . famotidine (PEPCID) 20 MG tablet Take 20 mg by mouth daily.  . hydrochlorothiazide (HYDRODIURIL) 25 MG tablet TAKE 1 TABLET BY MOUTH DAILY. TAKE WITH LOSARTAN  . losartan-hydrochlorothiazide (HYZAAR) 100-25 MG tablet TAKE 1 TABLET EACH DAY.  . memantine (NAMENDA) 10 MG tablet Take 10 mg by mouth 2 (two) times daily.  . metFORMIN (GLUCOPHAGE) 500 MG tablet TAKE (1/2) TABLET DAILY.  . metoprolol succinate (TOPROL-XL) 100 MG 24 hr tablet TAKE 1&1/2 TABLETS TWICE DAILY.  Marland Kitchen polyethylene glycol powder (GLYCOLAX/MIRALAX) 17 GM/SCOOP powder polyethylene glycol 3350 17 gram oral powder packet   17 g by oral route.  . vitamin B-12 (CYANOCOBALAMIN) 1000 MCG tablet Take 1 tablet (1,000 mcg total) by mouth daily.  . [DISCONTINUED] cetirizine (ZYRTEC) 10 MG tablet Take 1 tablet (10 mg total) by mouth at bedtime.  . [DISCONTINUED] fluticasone (FLONASE) 50 MCG/ACT nasal spray Place 1 spray into both nostrils daily.  Marland Kitchen HYDROcodone-Chlorpheniramine 5-4 MG/5ML SOLN Take 5 mLs by mouth at bedtime as needed (cough). (Patient not taking: Reported on 06/02/2020)  . hydrocortisone 2.5 % cream  (Patient not taking: Reported on 06/02/2020)  . ketoconazole (NIZORAL) 2 % cream  (Patient not taking: Reported on 06/02/2020)  . nystatin cream (MYCOSTATIN) APPLY TO SKIN TWICE DAILY AS DIRECTED. (Patient not taking: Reported on 06/02/2020)   No facility-administered encounter medications on file as of 06/02/2020.     Review of Systems:  Review of Systems   Constitutional: Negative for activity change, appetite change, chills, diaphoresis, fatigue, fever and unexpected weight change.  HENT: Positive for congestion, postnasal drip, rhinorrhea and sneezing. Negative for sinus pressure, sinus pain, sore throat, trouble swallowing and voice change.   Respiratory: Positive for cough. Negative for shortness of breath and wheezing.   Cardiovascular: Negative for chest pain, palpitations and leg swelling.  Gastrointestinal: Negative for abdominal distention, abdominal pain, constipation and diarrhea.  Genitourinary: Negative for difficulty urinating and dysuria.  Musculoskeletal: Positive for gait problem. Negative for arthralgias, back pain, joint swelling and myalgias.  Neurological: Negative for dizziness, tremors, seizures, syncope, facial asymmetry, speech difficulty, weakness, light-headedness, numbness and headaches.  Psychiatric/Behavioral: Negative for agitation, behavioral problems and confusion.       Memory loss      Health Maintenance  Topic Date Due  .  HEMOGLOBIN A1C  07/03/2020  . OPHTHALMOLOGY EXAM  07/11/2020  . INFLUENZA VACCINE  08/11/2020  . FOOT EXAM  01/08/2021  . TETANUS/TDAP  07/21/2027  . DEXA SCAN  Completed  . COVID-19 Vaccine  Completed  . PNA vac Low Risk Adult  Completed  . HPV VACCINES  Aged Out     Physical Exam:   Vitals:   06/02/20 1321  BP: (!) 146/92  Pulse: 77  Temp: (!) 96.3 F (35.7 C)  SpO2: 96%  Weight: 181 lb 3.2 oz (82.2 kg)  Height: 5\' 8"  (1.727 m)    Body mass index is 27.55 kg/m.  Physical Exam Vitals and nursing note reviewed.  Constitutional:      General: She is not in acute distress.    Appearance: She is not diaphoretic.  HENT:     Head: Normocephalic and atraumatic.     Nose: Congestion and rhinorrhea present.     Mouth/Throat:     Mouth: Mucous membranes are moist.     Pharynx: Oropharynx is clear. Posterior oropharyngeal erythema present. No oropharyngeal exudate.  Eyes:      Conjunctiva/sclera: Conjunctivae normal.     Pupils: Pupils are equal, round, and reactive to light.  Neck:     Vascular: No JVD.  Cardiovascular:     Rate and Rhythm: Normal rate and regular rhythm.     Heart sounds: No murmur heard.   Pulmonary:     Effort: Pulmonary effort is normal. No respiratory distress.     Breath sounds: No wheezing.     Comments: Decreased right base Abdominal:     General: Bowel sounds are normal. There is no distension.     Palpations: Abdomen is soft.  Musculoskeletal:     Cervical back: No rigidity or tenderness.  Lymphadenopathy:     Cervical: No cervical adenopathy.  Skin:    General: Skin is warm and dry.  Neurological:     Mental Status: She is alert and oriented to person, place, and time.  Psychiatric:        Mood and Affect: Mood normal.      Labs reviewed:  Basic Metabolic Panel:   Recent Labs    08/30/19 0000  NA 140  K 4.5  CL 100  CO2 25*  BUN 27*  CREATININE 1.1  CALCIUM 9.9    Liver Function Tests:   Recent Labs    08/30/19 0000  AST 15  ALT 10  ALBUMIN 4.0    No results for input(s): LIPASE, AMYLASE in the last 8760 hours.  No results for input(s): AMMONIA in the last 8760 hours.  CBC:   Recent Labs    08/30/19 0000  WBC 7.8  HGB 13.5  HCT 42  PLT 198    Lipid Panel:   Recent Labs    08/30/19 0000 01/03/20 0000  CHOL 179 230*  HDL 57 68  LDLCALC 90 131  TRIG 157 156     Lab Results  Component Value Date   HGBA1C 6.3 01/03/2020     Procedures since last visit:  No results found.   Assessment/Plan   1. Cough Symptoms seem allergy related but have been present for a month with decrease breath sounds in the right base, will give doxycycline and check a CXR - doxycycline (VIBRA-TABS) 100 MG tablet; Take 1 tablet (100 mg total) by mouth 2 (two) times daily.  Dispense: 14 tablet; Refill: 0   2. Seasonal allergies Try nasal spray 2 sprays bid to  clear the nasal pathways of allergen  and avoid triggers - triamcinolone (NASACORT) 55 MCG/ACT AERO nasal inhaler; Place 2 sprays into the nose daily.  Dispense: 1 each; Refill: 12 - fexofenadine (ALLEGRA) 180 MG tablet; Take 1 tablet (180 mg total) by mouth daily.  Dispense: 30 tablet; Refill: 2    Next appt: 07/23/20 with Dr Lyndel Safe

## 2020-06-02 NOTE — Patient Instructions (Addendum)
Nasacort 2 sprays qd   Try Doxycycline 100 mg bid x 7 days  Will check CXR  Try [employer] nasal spray twice daily (2 sprays)  Try allegra 180 mg qd

## 2020-06-03 DIAGNOSIS — R059 Cough, unspecified: Secondary | ICD-10-CM | POA: Diagnosis not present

## 2020-06-18 ENCOUNTER — Other Ambulatory Visit: Payer: Medicare Other

## 2020-07-10 ENCOUNTER — Other Ambulatory Visit: Payer: Self-pay | Admitting: Family

## 2020-07-10 DIAGNOSIS — R413 Other amnesia: Secondary | ICD-10-CM

## 2020-07-17 DIAGNOSIS — I10 Essential (primary) hypertension: Secondary | ICD-10-CM | POA: Diagnosis not present

## 2020-07-17 DIAGNOSIS — I5032 Chronic diastolic (congestive) heart failure: Secondary | ICD-10-CM | POA: Diagnosis not present

## 2020-07-17 DIAGNOSIS — E7800 Pure hypercholesterolemia, unspecified: Secondary | ICD-10-CM | POA: Diagnosis not present

## 2020-07-17 DIAGNOSIS — E1122 Type 2 diabetes mellitus with diabetic chronic kidney disease: Secondary | ICD-10-CM | POA: Diagnosis not present

## 2020-07-17 LAB — BASIC METABOLIC PANEL
BUN: 35 — AB (ref 4–21)
CO2: 26 — AB (ref 13–22)
Chloride: 104 (ref 99–108)
Creatinine: 1.4 — AB (ref 0.5–1.1)
Glucose: 114
Potassium: 3.8 (ref 3.4–5.3)
Sodium: 143 (ref 137–147)

## 2020-07-17 LAB — HEPATIC FUNCTION PANEL
ALT: 14 (ref 7–35)
AST: 18 (ref 13–35)
Alkaline Phosphatase: 96 (ref 25–125)
Bilirubin, Total: 0.6

## 2020-07-17 LAB — CBC AND DIFFERENTIAL
HCT: 41 (ref 36–46)
Hemoglobin: 13.6 (ref 12.0–16.0)
Platelets: 179 (ref 150–399)
WBC: 6.7

## 2020-07-17 LAB — LIPID PANEL
Cholesterol: 164 (ref 0–200)
HDL: 64 (ref 35–70)
LDL Cholesterol: 72
LDl/HDL Ratio: 2.5
Triglycerides: 139 (ref 40–160)

## 2020-07-17 LAB — COMPREHENSIVE METABOLIC PANEL
Albumin: 4.2 (ref 3.5–5.0)
Calcium: 9.6 (ref 8.7–10.7)

## 2020-07-17 LAB — TSH: TSH: 0.98 (ref 0.41–5.90)

## 2020-07-17 LAB — CBC: RBC: 4.4 (ref 3.87–5.11)

## 2020-07-17 LAB — HEMOGLOBIN A1C: Hemoglobin A1C: 6.9

## 2020-07-19 ENCOUNTER — Other Ambulatory Visit: Payer: Self-pay | Admitting: Interventional Cardiology

## 2020-07-23 ENCOUNTER — Encounter: Payer: Self-pay | Admitting: Internal Medicine

## 2020-07-23 ENCOUNTER — Non-Acute Institutional Stay: Payer: Medicare Other | Admitting: Internal Medicine

## 2020-07-23 ENCOUNTER — Other Ambulatory Visit: Payer: Self-pay

## 2020-07-23 VITALS — BP 166/88 | HR 100 | Temp 96.3°F | Ht 68.0 in | Wt 181.2 lb

## 2020-07-23 DIAGNOSIS — N1831 Chronic kidney disease, stage 3a: Secondary | ICD-10-CM

## 2020-07-23 DIAGNOSIS — M1990 Unspecified osteoarthritis, unspecified site: Secondary | ICD-10-CM | POA: Diagnosis not present

## 2020-07-23 DIAGNOSIS — E1122 Type 2 diabetes mellitus with diabetic chronic kidney disease: Secondary | ICD-10-CM

## 2020-07-23 DIAGNOSIS — J302 Other seasonal allergic rhinitis: Secondary | ICD-10-CM | POA: Diagnosis not present

## 2020-07-23 DIAGNOSIS — I10 Essential (primary) hypertension: Secondary | ICD-10-CM

## 2020-07-23 DIAGNOSIS — I5032 Chronic diastolic (congestive) heart failure: Secondary | ICD-10-CM

## 2020-07-23 DIAGNOSIS — M810 Age-related osteoporosis without current pathological fracture: Secondary | ICD-10-CM

## 2020-07-23 DIAGNOSIS — R413 Other amnesia: Secondary | ICD-10-CM | POA: Diagnosis not present

## 2020-07-23 DIAGNOSIS — E7800 Pure hypercholesterolemia, unspecified: Secondary | ICD-10-CM | POA: Diagnosis not present

## 2020-07-23 MED ORDER — METFORMIN HCL 500 MG PO TABS: ORAL_TABLET | ORAL | 0 refills | Status: DC

## 2020-07-23 NOTE — Progress Notes (Addendum)
Location:  [place] of Service:  Clinic (12)  Provider:   Code Status: DNR Goals of Care:  Advanced Directives 07/23/2020  Does Patient Have a Medical Advance Directive? Yes  Type of Paramedic of [place];Living will  Does patient want to make changes to medical advance directive? No - Patient declined  Copy of [place] in Chart? Yes - validated most recent copy scanned in chart (See row information)  Would patient like information on creating a medical advance directive? -  Pre-existing out of facility DNR order (yellow form or pink MOST form) -     Chief Complaint  Patient presents with   Medical Management of Chronic Issues    Patient returns to the clinic for follow up.    Health Maintenance    #4 covid vaccine, hemoglobin A1C, eye exam, Shingrix due    HPI: Patient is a 87 y.o. female seen today for medical management of chronic diseases.    Patient is active issues Has history of Alzheimer's dementia. Do not see any recent Head Imaging that she has been on Aricept and Namenda per Dr. Mariea Clonts and has been tolerating it. Says has to make many notes to herself Has Caregiver for company and to take her places but mostly she stays independent in her ADLS  Diastolic CHF Follows with Cardiology  HLD On Statin Arthritis. Uses Wheelchair for long Distance  Her son who is Cardiologist wants to know if she needs to keep taking Aspirin   Past Medical History:  Diagnosis Date   Childhood asthma    Chronic diastolic heart failure ([place]) 12/25/2012   October 2013 echocardiogram 1. There is moderate concentric left ventricle hypertrophy. 2. Left ventricular ejection fraction estimated by 2D at 60-65 percent. 3. There were no regional wall motion abnormalities. 4. Mild mitral annular calcification. 5. Mild mitral valve regurgitation. 6. There is mild tricuspid regurgitation. 7. Mildly elevated estimated  right ventricular systolic pressure. 8. Right ventricular systolic pressure estimated at 35-40 mm Hg. 9. Mild calcification of the aortic valve. 10. Trace aortic valve regurgitation. 11. Analysis of mitral valve inflow, pulmonary vein Doppler and tissue Doppler suggests grade I diastolic dysfunction without elevated left atrial pressure.    Colon polyp    Diverticular stricture ([place]) 2009   Diverticulosis 10-30-2007   Edema 09/12/13   Fall 05/18/2017   tripped over wheel of walker; superior and inferior pubic ramus fractures    Hypercholesterolemia    Hypertension    Hypertension, essential    Hypothyroidism    as a teenager   Indigestion    Insomnia    OA (osteoarthritis) of hip    right hip replacement, OA in bilateral knees   Osteopenia    Actonel stopped in 2012 after 5years therapy   Pneumonia 1939   "hospitalized"   Rapid resting heart rate 09/47/0962   Uncertain cause. Possible ectopic atrial tachycardia. Date entered 12/25/12    Right rotator cuff tear    Seasonal allergies     Past Surgical History:  Procedure Laterality Date   CATARACT EXTRACTION W/ INTRAOCULAR LENS  IMPLANT, BILATERAL Bilateral    COLONOSCOPY  2009   Dr. Redmond School   DILATION AND CURETTAGE OF UTERUS     FRACTURE SURGERY     JOINT REPLACEMENT     ORIF WRIST FRACTURE Right    REDUCTION MAMMAPLASTY  1999   TOTAL HIP ARTHROPLASTY Right 03-04-08   Dr. Wynelle Link  Allergies  Allergen Reactions   Amlodipine Besylate Other (See Comments)    Edema   Lisinopril Cough    Cough    Sulfamethoxazole-Trimethoprim Other (See Comments)    Unknown reaction     Outpatient Encounter Medications as of 07/23/2020  Medication Sig   acetaminophen (TYLENOL) 500 MG tablet Take 1 tablet (500 mg total) by mouth daily.   alendronate (FOSAMAX) 70 MG tablet TAKE 1 TAB ONCE A WEEK, AT LEAST 30 MIN BEFORE 1ST FOOD.DO NOT LIE DOWN FOR 30 MIN AFTER TAKING.   atorvastatin (LIPITOR) 40 MG tablet Take 1 tablet (40 mg total) by  mouth daily.   Cholecalciferol (VITAMIN D PO) Take 1 tablet by mouth every 7 (seven) days. 50,000 units   diclofenac sodium (VOLTAREN) 1 % GEL Apply 2 g topically every 6 (six) hours as needed.   donepezil (ARICEPT) 10 MG tablet TAKE ONE TABLET BY MOUTH AT BEDTIME   famotidine (PEPCID) 20 MG tablet Take 20 mg by mouth daily.   fexofenadine (ALLEGRA) 180 MG tablet Take 1 tablet (180 mg total) by mouth daily.   hydrochlorothiazide (HYDRODIURIL) 25 MG tablet TAKE 1 TABLET BY MOUTH DAILY. TAKE WITH LOSARTAN   losartan-hydrochlorothiazide (HYZAAR) 100-25 MG tablet TAKE 1 TABLET EACH DAY.   memantine (NAMENDA) 10 MG tablet Take 10 mg by mouth 2 (two) times daily.   metFORMIN (GLUCOPHAGE) 500 MG tablet TAKE (1/2) TABLET DAILY.   metoprolol succinate (TOPROL-XL) 100 MG 24 hr tablet TAKE 1&1/2 TABLETS TWICE DAILY.   polyethylene glycol powder (GLYCOLAX/MIRALAX) 17 GM/SCOOP powder polyethylene glycol 3350 17 gram oral powder packet   17 g by oral route.   sodium chloride (OCEAN) 0.65 % SOLN nasal spray Place 1 spray into both nostrils as needed for congestion.   triamcinolone (NASACORT) 55 MCG/ACT AERO nasal inhaler Place 2 sprays into the nose daily.   vitamin B-12 (CYANOCOBALAMIN) 1000 MCG tablet Take 1 tablet (1,000 mcg total) by mouth daily.   [DISCONTINUED] aspirin EC 81 MG tablet Take 81 mg by mouth daily.   [DISCONTINUED] doxycycline (VIBRA-TABS) 100 MG tablet Take 1 tablet (100 mg total) by mouth 2 (two) times daily.   [DISCONTINUED] HYDROcodone-Chlorpheniramine 5-4 MG/5ML SOLN Take 5 mLs by mouth at bedtime as needed (cough). (Patient not taking: No sig reported)   [DISCONTINUED] hydrocortisone 2.5 % cream  (Patient not taking: No sig reported)   [DISCONTINUED] ketoconazole (NIZORAL) 2 % cream  (Patient not taking: No sig reported)   [DISCONTINUED] metFORMIN (GLUCOPHAGE) 500 MG tablet TAKE (1/2) TABLET DAILY.   [DISCONTINUED] nystatin cream (MYCOSTATIN) APPLY TO SKIN TWICE DAILY AS DIRECTED.  (Patient not taking: No sig reported)   No facility-administered encounter medications on file as of 07/23/2020.    Review of Systems:  Review of Systems  Constitutional:  Positive for activity change.  HENT: Negative.    Respiratory: Negative.    Cardiovascular: Negative.   Gastrointestinal: Negative.  Negative for constipation.  Genitourinary: Negative.   Musculoskeletal:  Positive for arthralgias, gait problem and myalgias.  Skin: Negative.   Neurological:  Positive for weakness.  Psychiatric/Behavioral:  Positive for confusion.    Health Maintenance  Topic Date Due   Zoster Vaccines- Shingrix (1 of 2) Never done   COVID-19 Vaccine (4 - Booster for Moderna series) 03/26/2020   OPHTHALMOLOGY EXAM  07/11/2020   INFLUENZA VACCINE  08/11/2020   FOOT EXAM  01/08/2021   HEMOGLOBIN A1C  01/17/2021   TETANUS/TDAP  07/21/2027   DEXA SCAN  Completed   PNA vac  Low Risk Adult  Completed   HPV VACCINES  Aged Out    Physical Exam: Vitals:   07/23/20 0930  BP: (!) 166/88  Pulse: 100  Temp: (!) 96.3 F (35.7 C)  SpO2: 97%  Weight: 181 lb 3.2 oz (82.2 kg)  Height: 5\' 8"  (1.727 m)   Body mass index is 27.55 kg/m. Physical Exam Constitutional: Oriented to person, place, and time. Well-developed and well-nourished.  HENT:  Head: Normocephalic.  Mouth/Throat: Oropharynx is clear and moist.  Eyes: Pupils are equal, round, and reactive to light.  Neck: Neck supple.  Cardiovascular: Normal rate and normal heart sounds.  No murmur heard. Pulmonary/Chest: Effort normal and breath sounds normal. No respiratory distress. No wheezes. She has no rales.  Abdominal: Soft. Bowel sounds are normal. No distension. There is no tenderness. There is no rebound.  Musculoskeletal: No edema. Has Chronic Arthritis in both Knees Neurological: Alert and oriented to person, place, and time.  Skin: Skin is warm and dry.  Psychiatric: Normal mood and affect. Behavior is normal. Thought content normal.    MMSE - Mini Mental State Exam 07/23/2020 05/09/2019 07/19/2017  Orientation to time 4 4 4   Orientation to Place 5 5 5   Registration 3 3 3   Attention/ Calculation 1 5 5   Recall 2 3 1   Language- name 2 objects 2 2 2   Language- repeat 1 1 1   Language- follow 3 step command 3 3 3   Language- read & follow direction 1 1 1   Write a sentence 1 1 1   Copy design 1 1 1   Total score 24 29 27     Labs reviewed: Basic Metabolic Panel: Recent Labs    08/30/19 0000 07/17/20 0000  NA 140 143  K 4.5 3.8  CL 100 104  CO2 25* 26*  BUN 27* 35*  CREATININE 1.1 1.4*  CALCIUM 9.9 9.6  TSH  --  0.98   Liver Function Tests: Recent Labs    08/30/19 0000 07/17/20 0000  AST 15 18  ALT 10 14  ALKPHOS  --  96  ALBUMIN 4.0 4.2   No results for input(s): LIPASE, AMYLASE in the last 8760 hours. No results for input(s): AMMONIA in the last 8760 hours. CBC: Recent Labs    08/30/19 0000 07/17/20 0000  WBC 7.8 6.7  HGB 13.5 13.6  HCT 42 41  PLT 198 179   Lipid Panel: Recent Labs    08/30/19 0000 01/03/20 0000 07/17/20 0000  CHOL 179 230* 164  HDL 57 68 64  LDLCALC 90 131 72  TRIG 157 156 139   Lab Results  Component Value Date   HGBA1C 6.9 07/17/2020    Procedures since last visit: No results found.  Assessment/Plan Type 2 diabetes mellitus with stage 3a chronic kidney disease, without long-term current use of insulin (HCC) Metformin Renewed A1C in Good limits for her Age Memory loss, short term Slight drop in her MMSE On Namenda and Aricpet She passed her Clock Drawing Staying independent right now Chronic diastolic heart failure (HCC) On HCTZ per Dr Tamala Julian No edema  Can Stop her Aspirin as requested Do not see any reason for Primary Prevention Essential hypertension BP mildily high today Usually runs good She said she was Upset today  Hypercholesterolemia LDL good limits on Statin Stage 3a chronic kidney disease (HCC) Slight worsening in Creat Encourage PO  fluids Repeat BMP before Next visit Seasonal allergies On Allegra Osteoporosis On Fosamax Scheduled for her DEXA this year Arthritis Uses Wheelchair PRN  And Tylenol PRn Labs/tests ordered: A1C and BMP Next appt:  12/01/2020

## 2020-07-23 NOTE — Patient Instructions (Signed)
You can stop Your Aspirin Make appointment with your Eye Doctor for follow up

## 2020-07-28 ENCOUNTER — Encounter: Payer: Self-pay | Admitting: Internal Medicine

## 2020-08-11 ENCOUNTER — Other Ambulatory Visit: Payer: Self-pay | Admitting: Internal Medicine

## 2020-09-25 ENCOUNTER — Other Ambulatory Visit: Payer: Self-pay | Admitting: Family

## 2020-10-17 ENCOUNTER — Other Ambulatory Visit: Payer: Self-pay | Admitting: Internal Medicine

## 2020-10-18 ENCOUNTER — Other Ambulatory Visit: Payer: Self-pay | Admitting: Family

## 2020-10-24 DIAGNOSIS — Z23 Encounter for immunization: Secondary | ICD-10-CM | POA: Diagnosis not present

## 2020-10-31 DIAGNOSIS — Z23 Encounter for immunization: Secondary | ICD-10-CM | POA: Diagnosis not present

## 2020-11-22 ENCOUNTER — Emergency Department (HOSPITAL_BASED_OUTPATIENT_CLINIC_OR_DEPARTMENT_OTHER)
Admission: EM | Admit: 2020-11-22 | Discharge: 2020-11-22 | Disposition: A | Discharge status: Home / Self Care | Payer: Medicare Other | Attending: Emergency Medicine | Admitting: Emergency Medicine

## 2020-11-22 ENCOUNTER — Encounter (HOSPITAL_BASED_OUTPATIENT_CLINIC_OR_DEPARTMENT_OTHER): Payer: Self-pay

## 2020-11-22 ENCOUNTER — Emergency Department (HOSPITAL_BASED_OUTPATIENT_CLINIC_OR_DEPARTMENT_OTHER): Payer: Medicare Other | Admitting: Radiology

## 2020-11-22 ENCOUNTER — Other Ambulatory Visit: Payer: Self-pay

## 2020-11-22 DIAGNOSIS — S8001XA Contusion of right knee, initial encounter: Secondary | ICD-10-CM | POA: Insufficient documentation

## 2020-11-22 DIAGNOSIS — E039 Hypothyroidism, unspecified: Secondary | ICD-10-CM | POA: Diagnosis not present

## 2020-11-22 DIAGNOSIS — S8991XA Unspecified injury of right lower leg, initial encounter: Secondary | ICD-10-CM | POA: Diagnosis present

## 2020-11-22 DIAGNOSIS — N183 Chronic kidney disease, stage 3 unspecified: Secondary | ICD-10-CM | POA: Diagnosis not present

## 2020-11-22 DIAGNOSIS — Z043 Encounter for examination and observation following other accident: Secondary | ICD-10-CM | POA: Diagnosis not present

## 2020-11-22 DIAGNOSIS — Z7984 Long term (current) use of oral hypoglycemic drugs: Secondary | ICD-10-CM | POA: Insufficient documentation

## 2020-11-22 DIAGNOSIS — Z87891 Personal history of nicotine dependence: Secondary | ICD-10-CM | POA: Insufficient documentation

## 2020-11-22 DIAGNOSIS — I5032 Chronic diastolic (congestive) heart failure: Secondary | ICD-10-CM | POA: Insufficient documentation

## 2020-11-22 DIAGNOSIS — E1122 Type 2 diabetes mellitus with diabetic chronic kidney disease: Secondary | ICD-10-CM | POA: Insufficient documentation

## 2020-11-22 DIAGNOSIS — I130 Hypertensive heart and chronic kidney disease with heart failure and stage 1 through stage 4 chronic kidney disease, or unspecified chronic kidney disease: Secondary | ICD-10-CM | POA: Insufficient documentation

## 2020-11-22 DIAGNOSIS — J45909 Unspecified asthma, uncomplicated: Secondary | ICD-10-CM | POA: Insufficient documentation

## 2020-11-22 DIAGNOSIS — Y9301 Activity, walking, marching and hiking: Secondary | ICD-10-CM | POA: Diagnosis not present

## 2020-11-22 DIAGNOSIS — I4891 Unspecified atrial fibrillation: Secondary | ICD-10-CM | POA: Diagnosis not present

## 2020-11-22 DIAGNOSIS — W010XXA Fall on same level from slipping, tripping and stumbling without subsequent striking against object, initial encounter: Secondary | ICD-10-CM | POA: Diagnosis not present

## 2020-11-22 DIAGNOSIS — Z79899 Other long term (current) drug therapy: Secondary | ICD-10-CM | POA: Insufficient documentation

## 2020-11-22 DIAGNOSIS — Z96641 Presence of right artificial hip joint: Secondary | ICD-10-CM | POA: Diagnosis not present

## 2020-11-22 DIAGNOSIS — I10 Essential (primary) hypertension: Secondary | ICD-10-CM | POA: Diagnosis not present

## 2020-11-22 DIAGNOSIS — M1711 Unilateral primary osteoarthritis, right knee: Secondary | ICD-10-CM | POA: Diagnosis not present

## 2020-11-22 DIAGNOSIS — W19XXXA Unspecified fall, initial encounter: Secondary | ICD-10-CM | POA: Diagnosis not present

## 2020-11-22 NOTE — ED Notes (Signed)
Patient called [facility] to take her back home.  Daughter-in-law at bedside.  Report given to Kevan Rosebush, CNA.  Patient wheeled by Ms. Lovena Le to [facility] [employer].  Patient in stable condition during this discharge.

## 2020-11-22 NOTE — Discharge Instructions (Signed)
Return if any problems. See your Physician for recheck if pain persist  

## 2020-11-22 NOTE — ED Triage Notes (Signed)
She states that she fell (tripped/did not pass out) while walking today. She c/o right knee and coccyx area pain. She is awake, alert and oriented x 4 with clear speech.

## 2020-11-22 NOTE — ED Provider Notes (Signed)
[facility] EMERGENCY DEPT Provider Note   CSN: [id number] Arrival date & time: 11/22/20  1200     History Chief Complaint  Patient presents with   Fall/right knee and coccyx pain    Kelsey Salinas is a 87 y.o. female.  The history is provided by the patient. No language interpreter was used.  Fall This is a new problem. The current episode started 6 to 12 hours ago. The problem occurs constantly. The problem has not changed since onset.Pertinent negatives include no chest pain, no headaches and no shortness of breath. Nothing aggravates the symptoms. Nothing relieves the symptoms. She has tried nothing for the symptoms.  Pt reports she lost her balance and fell.  Pt reports she walks with a walker.  Pt denies weakness or passing out.  Pt reports she hit on her knee.  Pt reports she sat on bottom.  Pt reports no pain in bottom or back now.  Pt was able to stand up.  Pt has a walker at home     Past Medical History:  Diagnosis Date   Childhood asthma    Chronic diastolic heart failure [facility]) 12/25/2012   October 2013 echocardiogram 1. There is moderate concentric left ventricle hypertrophy. 2. Left ventricular ejection fraction estimated by 2D at 60-65 percent. 3. There were no regional wall motion abnormalities. 4. Mild mitral annular calcification. 5. Mild mitral valve regurgitation. 6. There is mild tricuspid regurgitation. 7. Mildly elevated estimated right ventricular systolic pressure. 8. Right ventricular systolic pressure estimated at 35-40 mm Hg. 9. Mild calcification of the aortic valve. 10. Trace aortic valve regurgitation. 11. Analysis of mitral valve inflow, pulmonary vein Doppler and tissue Doppler suggests grade I diastolic dysfunction without elevated left atrial pressure.    Colon polyp    Diverticular stricture ([place]) 2009   Diverticulosis 10-30-2007   Edema 09/12/13   Fall 05/18/2017   tripped over wheel of walker; superior and inferior pubic ramus  fractures    Hypercholesterolemia    Hypertension    Hypertension, essential    Hypothyroidism    as a teenager   Indigestion    Insomnia    OA (osteoarthritis) of hip    right hip replacement, OA in bilateral knees   Osteopenia    Actonel stopped in 2012 after 5years therapy   Pneumonia 1939   "hospitalized"   Rapid resting heart rate 87/86/7672   Uncertain cause. Possible ectopic atrial tachycardia. Date entered 12/25/12    Right rotator cuff tear    Seasonal allergies     Patient Active Problem List   Diagnosis Date Noted   Memory loss, short term 12/13/2018   DDD (degenerative disc disease), cervical 12/13/2018   B12 deficiency 12/13/2018   Chronic cough 07/25/2017   Abnormal MMSE 07/25/2017   Type II diabetes mellitus with stage 3 chronic kidney disease ([place]) 05/31/2017   Sinus tachycardia    Multiple fractures of ramus of right pubis with routine healing    Closed pelvic fracture ([place]) 05/18/2017   Bilateral chronic knee pain 01/18/2017   Bilateral primary osteoarthritis of knee 04/07/2016   Hyperglycemia 10/01/2015   Fracture of 5th metatarsal 02/14/2015   CKD (chronic kidney disease) stage 3, GFR 30-59 ml/min (HCC) 12/10/2013   Sebaceous cyst 12/10/2013   Insomnia    Colon polyp    Diverticular stricture ([place])    Osteopenia    Vitamin D deficiency 10/31/2013   Indigestion    Primary osteoarthritis of hip    Chronic diastolic heart  failure ([place]) 12/25/2012   Rapid resting heart rate 12/25/2012   Essential hypertension 12/25/2012   Hypercholesterolemia     Past Surgical History:  Procedure Laterality Date   CATARACT EXTRACTION W/ INTRAOCULAR LENS  IMPLANT, BILATERAL Bilateral    COLONOSCOPY  2009   Dr. [facility]   DILATION AND CURETTAGE OF UTERUS     FRACTURE SURGERY     JOINT REPLACEMENT     ORIF WRIST FRACTURE Right    REDUCTION MAMMAPLASTY  1999   TOTAL HIP ARTHROPLASTY Right 03-04-08   Dr. Wynelle Link     OB History   No obstetric history on file.      Family History  Problem Relation Age of Onset   Heart disease Mother    Heart disease Father    Heart disease Brother     Social History   Tobacco Use   Smoking status: Former    Packs/day: 0.12    Years: 50.00    Pack years: 6.00    Types: Cigarettes   Smokeless tobacco: Never   Tobacco comments:    "stopped in the ate 1990s"  Vaping Use   Vaping Use: Never used  Substance Use Topics   Alcohol use: Yes    Comment: rarely   Drug use: Never    Home Medications Prior to Admission medications   Medication Sig Start Date End Date Taking? Authorizing Provider  acetaminophen (TYLENOL) 500 MG tablet Take 1 tablet (500 mg total) by mouth daily. 09/05/19   Reed, Tiffany L, DO  alendronate (FOSAMAX) 70 MG tablet TAKE 1 TAB ONCE A WEEK, AT LEAST 30 MIN BEFORE 1ST FOOD.DO NOT LIE DOWN FOR 30 MIN AFTER TAKING. 04/06/18   Reed, Tiffany L, DO  atorvastatin (LIPITOR) 40 MG tablet Take 1 tablet (40 mg total) by mouth daily. 01/09/20   Reed, Tiffany L, DO  Cholecalciferol (VITAMIN D PO) Take 1 tablet by mouth every 7 (seven) days. 50,000 units    [provider]  diclofenac sodium (VOLTAREN) 1 % GEL Apply 2 g topically every 6 (six) hours as needed.    [provider]  donepezil (ARICEPT) 10 MG tablet TAKE ONE TABLET BY MOUTH AT BEDTIME 07/10/20   Virgie Dad, MD  famotidine (PEPCID) 20 MG tablet Take 20 mg by mouth daily.    [provider]  fexofenadine (ALLEGRA) 180 MG tablet Take 1 tablet (180 mg total) by mouth daily. 06/02/20   Royal Hawthorn, NP  hydrochlorothiazide (HYDRODIURIL) 25 MG tablet TAKE 1 TABLET ONCE A DAY WITH LOSARTAN UNTIL COMBO TABLET BACK AVAILABLE. 10/21/20   Virgie Dad, MD  losartan (COZAAR) 100 MG tablet TAKE 1 TABLET DAILY WITH HCTZ TIL COMBINATION PILL AVAILABLE. 10/21/20   Virgie Dad, MD  losartan-hydrochlorothiazide (HYZAAR) 100-25 MG tablet TAKE 1 TABLET EACH DAY. 09/26/19   Ngetich, Dinah C, NP  memantine (NAMENDA) 10 MG  tablet TAKE 1 TABLET BY MOUTH TWICE DAILY. 09/25/20   Virgie Dad, MD  metFORMIN (GLUCOPHAGE) 500 MG tablet TAKE (1/2) TABLET DAILY. 10/17/20   Virgie Dad, MD  metoprolol succinate (TOPROL-XL) 100 MG 24 hr tablet TAKE 1&1/2 TABLETS TWICE DAILY. 07/21/20   Belva Crome, MD  polyethylene glycol powder [facility]) 17 GM/SCOOP powder polyethylene glycol 3350 17 gram oral powder packet   17 g by oral route. 05/20/17   [provider]  sodium chloride (OCEAN) 0.65 % SOLN nasal spray Place 1 spray into both nostrils as needed for congestion. 06/02/20   Royal Hawthorn,  NP  triamcinolone (NASACORT) 55 MCG/ACT AERO nasal inhaler Place 2 sprays into the nose daily. 06/02/20   Royal Hawthorn, NP  vitamin B-12 (CYANOCOBALAMIN) 1000 MCG tablet Take 1 tablet (1,000 mcg total) by mouth daily. 12/16/17   Reed, Tiffany L, DO    Allergies    Amlodipine besylate, Lisinopril, and Sulfamethoxazole-trimethoprim  Review of Systems   Review of Systems  Respiratory:  Negative for shortness of breath.   Cardiovascular:  Negative for chest pain.  Musculoskeletal:  Positive for joint swelling and myalgias.  Neurological:  Negative for headaches.  All other systems reviewed and are negative.  Physical Exam Updated Vital Signs BP (!) 147/86 (BP Location: Right Arm)   Pulse (!) 101   Temp 97.9 F (36.6 C) (Oral)   Resp 16   SpO2 96%   Physical Exam Vitals and nursing note reviewed.  Constitutional:      Appearance: She is well-developed.  HENT:     Head: Normocephalic.  Cardiovascular:     Rate and Rhythm: Normal rate.  Pulmonary:     Effort: Pulmonary effort is normal.  Abdominal:     General: There is no distension.  Musculoskeletal:        General: Swelling and tenderness present.     Cervical back: Normal range of motion.     Comments: Bruised swollen right knee,  pain with movement  nv and ns intact   Skin:    General: Skin is warm.  Neurological:     Mental Status: She is  alert and oriented to person, place, and time.  Psychiatric:        Mood and Affect: Mood normal.    ED Results / Procedures / Treatments   Labs (all labs ordered are listed, but only abnormal results are displayed) Labs Reviewed - No data to display  EKG None  Radiology DG Knee Complete 4 Views Right  Result Date: 11/22/2020 CLINICAL DATA:  87 year old female status post fall. EXAM: RIGHT KNEE - COMPLETE 4+ VIEW COMPARISON:  None. FINDINGS: No evidence of fracture, dislocation, or joint effusion. Moderate mediolateral compartment joint space narrowing associated subchondral sclerosis and mild periarticular osteophyte formation. Soft tissue prominence about the anterior knee. IMPRESSION: 1. No acute fracture or malalignment. 2. Moderate tricompartmental degenerative changes. Electronically Signed   By: Ruthann Cancer M.D.   On: 11/22/2020 14:50    Procedures Procedures   Medications Ordered in ED Medications - No data to display  ED Course  I have reviewed the triage vital signs and the nursing notes.  Pertinent labs & imaging results that were available during my care of the patient were reviewed by me and considered in my medical decision making (see chart for details).    MDM Rules/Calculators/A&P                           MDM: Xray  no fracture.  Pt counseled on results and follow up  Ace wrap to area  Final Clinical Impression(s) / ED Diagnoses Final diagnoses:  Contusion of right knee, initial encounter    Rx / DC Orders ED Discharge Orders     None     An After Visit Summary was printed and given to the patient.    Fransico Meadow, [place] 11/22/20 1939    Pattricia Boss, MD 12/27/20 [phone]

## 2020-11-24 ENCOUNTER — Encounter: Payer: Self-pay | Admitting: Internal Medicine

## 2020-11-25 DIAGNOSIS — E1122 Type 2 diabetes mellitus with diabetic chronic kidney disease: Secondary | ICD-10-CM | POA: Diagnosis not present

## 2020-11-25 DIAGNOSIS — N1831 Chronic kidney disease, stage 3a: Secondary | ICD-10-CM | POA: Diagnosis not present

## 2020-11-25 LAB — COMPREHENSIVE METABOLIC PANEL
Albumin: 3.5 (ref 3.5–5.0)
Calcium: 9.1 (ref 8.7–10.7)
GFR calc Af Amer: 39.82
GFR calc non Af Amer: 34.36
Globulin: 2.3

## 2020-11-25 LAB — HEMOGLOBIN A1C: Hemoglobin A1C: 7

## 2020-11-26 LAB — BASIC METABOLIC PANEL
BUN: 38 — AB (ref 4–21)
CO2: 25 — AB (ref 13–22)
Chloride: 108 (ref 99–108)
Creatinine: 1.3 — AB (ref 0.5–1.1)
Glucose: 124
Potassium: 4 (ref 3.4–5.3)
Sodium: 145 (ref 137–147)

## 2020-11-26 LAB — HEPATIC FUNCTION PANEL
ALT: 10 (ref 7–35)
AST: 13 (ref 13–35)
Alkaline Phosphatase: 75 (ref 25–125)
Bilirubin, Total: 0.6

## 2020-11-27 DIAGNOSIS — F418 Other specified anxiety disorders: Secondary | ICD-10-CM | POA: Diagnosis not present

## 2020-11-27 DIAGNOSIS — M25561 Pain in right knee: Secondary | ICD-10-CM | POA: Diagnosis not present

## 2020-11-27 DIAGNOSIS — R2689 Other abnormalities of gait and mobility: Secondary | ICD-10-CM | POA: Diagnosis not present

## 2020-11-27 DIAGNOSIS — Z9181 History of falling: Secondary | ICD-10-CM | POA: Diagnosis not present

## 2020-11-27 DIAGNOSIS — R278 Other lack of coordination: Secondary | ICD-10-CM | POA: Diagnosis not present

## 2020-11-27 DIAGNOSIS — R2681 Unsteadiness on feet: Secondary | ICD-10-CM | POA: Diagnosis not present

## 2020-12-01 ENCOUNTER — Other Ambulatory Visit: Payer: Self-pay

## 2020-12-01 ENCOUNTER — Non-Acute Institutional Stay: Payer: Medicare Other | Admitting: Adult Health

## 2020-12-01 ENCOUNTER — Encounter: Payer: Self-pay | Admitting: Adult Health

## 2020-12-01 VITALS — BP 140/92 | HR 95 | Temp 96.4°F | Ht 68.0 in

## 2020-12-01 DIAGNOSIS — S8001XA Contusion of right knee, initial encounter: Secondary | ICD-10-CM

## 2020-12-01 DIAGNOSIS — R413 Other amnesia: Secondary | ICD-10-CM | POA: Diagnosis not present

## 2020-12-01 DIAGNOSIS — M810 Age-related osteoporosis without current pathological fracture: Secondary | ICD-10-CM

## 2020-12-01 DIAGNOSIS — N184 Chronic kidney disease, stage 4 (severe): Secondary | ICD-10-CM | POA: Diagnosis not present

## 2020-12-01 DIAGNOSIS — I5032 Chronic diastolic (congestive) heart failure: Secondary | ICD-10-CM | POA: Diagnosis not present

## 2020-12-01 DIAGNOSIS — I10 Essential (primary) hypertension: Secondary | ICD-10-CM

## 2020-12-01 DIAGNOSIS — R2681 Unsteadiness on feet: Secondary | ICD-10-CM | POA: Diagnosis not present

## 2020-12-01 DIAGNOSIS — E1122 Type 2 diabetes mellitus with diabetic chronic kidney disease: Secondary | ICD-10-CM | POA: Diagnosis not present

## 2020-12-01 NOTE — Patient Instructions (Signed)
Will stop metformin due to renal function   Need clarification on your medications including hctz and losartan. I will have the clinic nurse assess your medications in your home.  Change dressing daily to right knee keep clean and dry  Continue to work with physical therapy if not making progress let us know and we can refer you to orthopedics

## 2020-12-01 NOTE — Progress Notes (Addendum)
Location:  [facility]  POS: clinic  Provider:  Cindi Carbon, ANP [facility] [phone]     Goals of Care:  Advanced Directives 12/01/2020  Does Patient Have a Medical Advance Directive? No  Type of Advance Directive -  Does patient want to make changes to medical advance directive? -  Copy of [place] in Chart? -  Would patient like information on creating a medical advance directive? No - Patient declined  Pre-existing out of facility DNR order (yellow form or pink MOST form) -     Chief Complaint  Patient presents with   Medical Management of Chronic Issues    Patient returns to the clinic for her 4 month follow up. CC: Right leg/knee pain. Patient and care giver unable to confirm medications   Quality Metric Gaps    Zoster Vaccines- Shingrix (1 of 2)   Pneumonia Vaccine 67+ Years old (2 - PPSV23 if available, else PCV20) OPHTHALMOLOGY EXAM (Yearly)      HPI: Patient is a 88 y.o. female seen today for medical management of chronic diseases.    She was seen in the ED 11/22/20 due to a fall which was mechanical in nature. She hit her knee and was diagnosed with a knee contusion. 4 view knee xray showed no fx. Her son messaged me in epic and requested a PT eval for gait related concerns and knee pain. She denies any pain to the knee but has significant bruising and swelling. She reports she is not walking very much but has only worked with therapy twice. She is afraid to walk again. Needs more assistance per caregiver.   A1C 7 11/26/20, slight increase from 6.9 4 months ago.   BUN and Cr are rising over the past year 25/1.3 11/26/20 >>27/1.1 08/30/19   Legs measured 5 inch below the knee for circumference Left 13 in, Right 15in by her son on 11/13. I re measured L 11in R 13in 12/01/2020   Hx of Alz dementia MMSE below. Passed clock.  MMSE - Mini Mental State Exam 07/23/2020 05/09/2019 07/19/2017 04/07/2016 04/02/2015 12/10/2013  Orientation  to time 4 4 4 5 5 5   Orientation to Place 5 5 5 5 5 5   Registration 3 3 3 3 3 3   Attention/ Calculation 1 5 5 5 5 5   Recall 2 3 1 3 3 3   Language- name 2 objects 2 2 2 2 2 2   Language- repeat 1 1 1 1 1 1   Language- follow 3 step command 3 3 3 3 3 3   Language- read & follow direction 1 1 1 1 1 1   Write a sentence 1 1 1 1 1 1   Copy design 1 1 1 1 1 1   Total score 24 29 27 30 30 30      Past Medical History:  Diagnosis Date   Childhood asthma    Chronic diastolic heart failure ([place]) 12/25/2012   October 2013 echocardiogram 1. There is moderate concentric left ventricle hypertrophy. 2. Left ventricular ejection fraction estimated by 2D at 60-65 percent. 3. There were no regional wall motion abnormalities. 4. Mild mitral annular calcification. 5. Mild mitral valve regurgitation. 6. There is mild tricuspid regurgitation. 7. Mildly elevated estimated right ventricular systolic pressure. 8. Right ventricular systolic pressure estimated at 35-40 mm Hg. 9. Mild calcification of the aortic valve. 10. Trace aortic valve regurgitation. 11. Analysis of mitral valve inflow, pulmonary vein Doppler and tissue Doppler suggests grade I diastolic dysfunction without elevated  left atrial pressure.    Colon polyp    Diverticular stricture ([place]) 2009   Diverticulosis 10-30-2007   Edema 09/12/13   Fall 05/18/2017   tripped over wheel of walker; superior and inferior pubic ramus fractures    Hypercholesterolemia    Hypertension    Hypertension, essential    Hypothyroidism    as a teenager   Indigestion    Insomnia    OA (osteoarthritis) of hip    right hip replacement, OA in bilateral knees   Osteopenia    Actonel stopped in 2012 after 5years therapy   Pneumonia 1939   "hospitalized"   Rapid resting heart rate 77/93/9030   Uncertain cause. Possible ectopic atrial tachycardia. Date entered 12/25/12    Right rotator cuff tear    Seasonal allergies     Past Surgical History:  Procedure Laterality Date    CATARACT EXTRACTION W/ INTRAOCULAR LENS  IMPLANT, BILATERAL Bilateral    COLONOSCOPY  2009   Dr. [facility]   DILATION AND CURETTAGE OF UTERUS     FRACTURE SURGERY     JOINT REPLACEMENT     ORIF WRIST FRACTURE Right    REDUCTION MAMMAPLASTY  1999   TOTAL HIP ARTHROPLASTY Right 03-04-08   Dr. Wynelle Link    Allergies  Allergen Reactions   Amlodipine Besylate Other (See Comments)    Edema   Lisinopril Cough    Cough    Sulfamethoxazole-Trimethoprim Other (See Comments)    Unknown reaction     Outpatient Encounter Medications as of 12/01/2020  Medication Sig   acetaminophen (TYLENOL) 500 MG tablet Take 1 tablet (500 mg total) by mouth daily.   alendronate (FOSAMAX) 70 MG tablet TAKE 1 TAB ONCE A WEEK, AT LEAST 30 MIN BEFORE 1ST FOOD.DO NOT LIE DOWN FOR 30 MIN AFTER TAKING.   atorvastatin (LIPITOR) 40 MG tablet Take 1 tablet (40 mg total) by mouth daily.   Cholecalciferol (VITAMIN D PO) Take 1 tablet by mouth every 7 (seven) days. 50,000 units   diclofenac sodium (VOLTAREN) 1 % GEL Apply 2 g topically every 6 (six) hours as needed.   donepezil (ARICEPT) 10 MG tablet TAKE ONE TABLET BY MOUTH AT BEDTIME   famotidine (PEPCID) 20 MG tablet Take 20 mg by mouth daily.   fexofenadine (ALLEGRA) 180 MG tablet Take 1 tablet (180 mg total) by mouth daily.   hydrochlorothiazide (HYDRODIURIL) 25 MG tablet TAKE 1 TABLET ONCE A DAY WITH LOSARTAN UNTIL COMBO TABLET BACK AVAILABLE.   losartan (COZAAR) 100 MG tablet TAKE 1 TABLET DAILY WITH HCTZ TIL COMBINATION PILL AVAILABLE.   losartan-hydrochlorothiazide (HYZAAR) 100-25 MG tablet TAKE 1 TABLET EACH DAY.   memantine (NAMENDA) 10 MG tablet TAKE 1 TABLET BY MOUTH TWICE DAILY.   metoprolol succinate (TOPROL-XL) 100 MG 24 hr tablet TAKE 1&1/2 TABLETS TWICE DAILY.   polyethylene glycol powder (GLYCOLAX/MIRALAX) 17 GM/SCOOP powder polyethylene glycol 3350 17 gram oral powder packet   17 g by oral route.   sodium chloride (OCEAN) 0.65 % SOLN nasal spray  Place 1 spray into both nostrils as needed for congestion.   triamcinolone (NASACORT) 55 MCG/ACT AERO nasal inhaler Place 2 sprays into the nose daily.   vitamin B-12 (CYANOCOBALAMIN) 1000 MCG tablet Take 1 tablet (1,000 mcg total) by mouth daily.   [DISCONTINUED] metFORMIN (GLUCOPHAGE) 500 MG tablet TAKE (1/2) TABLET DAILY.   No facility-administered encounter medications on file as of 12/01/2020.    Review of Systems:  Review of Systems  Constitutional:  Positive for activity change.  Negative for appetite change, chills, diaphoresis, fatigue, fever and unexpected weight change.  HENT:  Negative for congestion, sore throat and trouble swallowing.   Respiratory:  Negative for cough (chronic but improved.), shortness of breath and wheezing.   Cardiovascular:  Positive for leg swelling. Negative for chest pain and palpitations.  Gastrointestinal:  Negative for abdominal distention, abdominal pain, constipation and diarrhea.  Genitourinary:  Negative for difficulty urinating and dysuria.  Musculoskeletal:  Positive for gait problem and joint swelling. Negative for arthralgias, back pain and myalgias.  Skin:  Positive for wound.  Neurological:  Negative for dizziness, tremors, seizures, syncope, facial asymmetry, speech difficulty, weakness, light-headedness, numbness and headaches.  Psychiatric/Behavioral:  Negative for agitation, behavioral problems and confusion.    Health Maintenance  Topic Date Due   Zoster Vaccines- Shingrix (1 of 2) Never done   Pneumonia Vaccine 71+ Years old (2 - PPSV23 if available, else PCV20) 06/04/2015   OPHTHALMOLOGY EXAM  07/11/2020   COVID-19 Vaccine (4 - Booster for Moderna series) 12/19/2020   FOOT EXAM  01/08/2021   HEMOGLOBIN A1C  05/26/2021   TETANUS/TDAP  07/21/2027   INFLUENZA VACCINE  Completed   DEXA SCAN  Completed   HPV VACCINES  Aged Out    Physical Exam: Vitals:   12/01/20 1505  BP: (!) 140/92  Pulse: 95  Temp: (!) 96.4 F (35.8 C)   SpO2: 98%  Height: 5\' 8"  (1.727 m)   Body mass index is 27.55 kg/m. Physical Exam Vitals and nursing note reviewed.  Constitutional:      General: She is not in acute distress.    Appearance: She is not diaphoretic.  HENT:     Head: Normocephalic and atraumatic.     Nose: Nose normal.     Mouth/Throat:     Mouth: Mucous membranes are moist.     Pharynx: Oropharynx is clear.  Eyes:     Conjunctiva/sclera: Conjunctivae normal.     Pupils: Pupils are equal, round, and reactive to light.  Neck:     Vascular: No JVD.  Cardiovascular:     Rate and Rhythm: Normal rate and regular rhythm.     Heart sounds: No murmur heard. Pulmonary:     Effort: Pulmonary effort is normal. No respiratory distress.     Breath sounds: Normal breath sounds. No wheezing.  Abdominal:     General: Bowel sounds are normal. There is no distension.     Palpations: Abdomen is soft.     Tenderness: There is no abdominal tenderness.  Musculoskeletal:        General: Swelling (right knee to calf area with ecchymosis.) present.     Right knee: Swelling and ecchymosis present. No deformity, erythema, lacerations or bony tenderness. Normal range of motion. No tenderness. No LCL laxity, MCL laxity or ACL laxity. Normal alignment and normal patellar mobility. Normal pulse.     Instability Tests: Anterior drawer test negative.     Left knee: Normal.     Right lower leg: Swelling present. No deformity, tenderness or bony tenderness.     Left lower leg: Normal.  Skin:    General: Skin is warm and dry.     Comments: Right knee with small abrasion no purulent drainage or erythema. Right calf with small abrasion no drainage or erythema.   Neurological:     Mental Status: She is alert and oriented to person, place, and time.  Psychiatric:        Mood and Affect: Mood normal.  Labs reviewed: Basic Metabolic Panel: Recent Labs    07/17/20 0000 11/26/20 0000  NA 143 145  K 3.8 4.0  CL 104 108  CO2 26* 25*   BUN 35* 38*  CREATININE 1.4* 1.3*  CALCIUM 9.6 9.1  TSH 0.98  --    Liver Function Tests: Recent Labs    07/17/20 0000 11/26/20 0000  AST 18 13  ALT 14 10  ALKPHOS 96 75  ALBUMIN 4.2 3.5   No results for input(s): LIPASE, AMYLASE in the last 8760 hours. No results for input(s): AMMONIA in the last 8760 hours. CBC: Recent Labs    07/17/20 0000  WBC 6.7  HGB 13.6  HCT 41  PLT 179   Lipid Panel: Recent Labs    01/03/20 0000 07/17/20 0000  CHOL 230* 164  HDL 68 64  LDLCALC 131 72  TRIG 156 139   Lab Results  Component Value Date   HGBA1C 7.0 11/26/2020    Procedures since last visit: DG Knee Complete 4 Views Right  Result Date: 11/22/2020 CLINICAL DATA:  87 year old female status post fall. EXAM: RIGHT KNEE - COMPLETE 4+ VIEW COMPARISON:  None. FINDINGS: No evidence of fracture, dislocation, or joint effusion. Moderate mediolateral compartment joint space narrowing associated subchondral sclerosis and mild periarticular osteophyte formation. Soft tissue prominence about the anterior knee. IMPRESSION: 1. No acute fracture or malalignment. 2. Moderate tricompartmental degenerative changes. Electronically Signed   By: Ruthann Cancer M.D.   On: 11/22/2020 14:50    Assessment/Plan  1. Contusion of right knee, initial encounter Fairly significant bruising and swelling but improved over time If no gains are made in therapy can f/u with ortho  2. Essential hypertension Need to clarify that she is only taking 25mg  of HCTZ with 100 mg of losartan. She is a fall risk so aggressive bp management would not be warranted. She is followed by cardiology as well.  3. CKD (chronic kidney disease) stage 4, GFR 15-29 ml/min (HCC) Seems to be worsening. Discussed with Dr. Lyndel Safe. Will d/c metformin due to her CrCl. May ultimately need to adjust the hctz and/or order renal ultrasound. Will have clinic nurse go to her home and verify that resident is taking Hyzaar alone and not losartan  and hctz together with Hyzaar as they are both on her list. She says she does not know her medications and can't answer questions about them. She is open to having [facility] provide med management.   4. Type 2 diabetes mellitus with stage 4 chronic kidney disease, without long-term current use of insulin (HCC) D/C metformin due to CKD and A1C below goal as discussed with Dr. Lyndel Safe. Goal <8 at this age.   5. Memory loss May need med management. See #3. Continue Aricept and Namenda.   6. Chronic diastolic heart failure (HCC) EF 60-65% with LVH  Not current having any issues. On HCTZ Followed by cardiology   7. Gait instability PT ordered  8. Age-related osteoporosis without current pathological fracture Has hx of osteopenia with pelvic fracture. Dexa scan ordered and currently she is on fosamax since 2020. May consider drug holiday in f/u apt with Dr. Darnell Level           Labs/tests ordered:  * No order type specified * CBC BMP A1C lipid Next appt:  F/U in 4 months with Dr. Darnell Level  Total time 50min:  time greater than 50% of total time spent doing pt counseling and coordination of care

## 2020-12-02 ENCOUNTER — Telehealth: Payer: Self-pay | Admitting: Adult Health

## 2020-12-02 ENCOUNTER — Other Ambulatory Visit: Payer: Medicare Other

## 2020-12-02 NOTE — Telephone Encounter (Signed)
The clinic nurse at [facility] went to Ms. Pemberton's home and assessed her ability to self manage her medications and found that she continues to be able to do this independently. We clarified that she is no longer taking the combo pill hyzaar but does take losartan and hctz separately. This may help in the future if we need to adjust meds due to her renal function. Will leave the hctz the same for now since her bp has been on the higher side in her last few visits and check her renal function at her next visit with Dr. Lyndel Safe.

## 2020-12-08 NOTE — Telephone Encounter (Signed)
I replied to patient and/or family member. I will send to provider also as a FYI and to add any additional reply to patient if warranted.

## 2020-12-08 NOTE — Telephone Encounter (Signed)
Christy's message was copied and pasted to the patient and/or caregiver

## 2020-12-08 NOTE — Telephone Encounter (Signed)
I am sorry to hear that she developed cellulitis. There was no pain or redness when I saw her. I let her know at the time of the apt that if she was not improving, we could refer her to ortho. She also may need an ultrasound. One of [place] will be happy to see her.  Thanks.

## 2020-12-09 ENCOUNTER — Inpatient Hospital Stay (HOSPITAL_BASED_OUTPATIENT_CLINIC_OR_DEPARTMENT_OTHER)
Admission: EM | Admit: 2020-12-09 | Discharge: 2020-12-12 | DRG: 603 | Disposition: A | Discharge status: Home / Self Care | Payer: Medicare Other | Attending: Internal Medicine | Admitting: Internal Medicine

## 2020-12-09 ENCOUNTER — Other Ambulatory Visit: Payer: Self-pay

## 2020-12-09 ENCOUNTER — Emergency Department (HOSPITAL_BASED_OUTPATIENT_CLINIC_OR_DEPARTMENT_OTHER): Payer: Medicare Other | Admitting: Radiology

## 2020-12-09 ENCOUNTER — Encounter (HOSPITAL_BASED_OUTPATIENT_CLINIC_OR_DEPARTMENT_OTHER): Payer: Self-pay | Admitting: Emergency Medicine

## 2020-12-09 DIAGNOSIS — S81011A Laceration without foreign body, right knee, initial encounter: Secondary | ICD-10-CM | POA: Diagnosis not present

## 2020-12-09 DIAGNOSIS — L03115 Cellulitis of right lower limb: Secondary | ICD-10-CM | POA: Diagnosis not present

## 2020-12-09 DIAGNOSIS — Z20822 Contact with and (suspected) exposure to covid-19: Secondary | ICD-10-CM | POA: Diagnosis present

## 2020-12-09 DIAGNOSIS — R000 Tachycardia, unspecified: Secondary | ICD-10-CM | POA: Diagnosis not present

## 2020-12-09 DIAGNOSIS — E538 Deficiency of other specified B group vitamins: Secondary | ICD-10-CM | POA: Diagnosis present

## 2020-12-09 DIAGNOSIS — M25561 Pain in right knee: Secondary | ICD-10-CM | POA: Diagnosis not present

## 2020-12-09 DIAGNOSIS — L0390 Cellulitis, unspecified: Secondary | ICD-10-CM | POA: Diagnosis not present

## 2020-12-09 DIAGNOSIS — Z66 Do not resuscitate: Secondary | ICD-10-CM | POA: Diagnosis present

## 2020-12-09 DIAGNOSIS — E7800 Pure hypercholesterolemia, unspecified: Secondary | ICD-10-CM | POA: Diagnosis present

## 2020-12-09 DIAGNOSIS — E1122 Type 2 diabetes mellitus with diabetic chronic kidney disease: Secondary | ICD-10-CM | POA: Diagnosis not present

## 2020-12-09 DIAGNOSIS — Z96641 Presence of right artificial hip joint: Secondary | ICD-10-CM | POA: Diagnosis present

## 2020-12-09 DIAGNOSIS — N1831 Chronic kidney disease, stage 3a: Secondary | ICD-10-CM | POA: Diagnosis present

## 2020-12-09 DIAGNOSIS — N1832 Chronic kidney disease, stage 3b: Secondary | ICD-10-CM | POA: Diagnosis not present

## 2020-12-09 DIAGNOSIS — M25569 Pain in unspecified knee: Secondary | ICD-10-CM

## 2020-12-09 DIAGNOSIS — Z8249 Family history of ischemic heart disease and other diseases of the circulatory system: Secondary | ICD-10-CM | POA: Diagnosis not present

## 2020-12-09 DIAGNOSIS — E039 Hypothyroidism, unspecified: Secondary | ICD-10-CM | POA: Diagnosis present

## 2020-12-09 DIAGNOSIS — Z87891 Personal history of nicotine dependence: Secondary | ICD-10-CM

## 2020-12-09 DIAGNOSIS — L03116 Cellulitis of left lower limb: Secondary | ICD-10-CM | POA: Diagnosis present

## 2020-12-09 DIAGNOSIS — I5032 Chronic diastolic (congestive) heart failure: Secondary | ICD-10-CM | POA: Diagnosis present

## 2020-12-09 DIAGNOSIS — N183 Chronic kidney disease, stage 3 unspecified: Secondary | ICD-10-CM | POA: Diagnosis present

## 2020-12-09 DIAGNOSIS — I130 Hypertensive heart and chronic kidney disease with heart failure and stage 1 through stage 4 chronic kidney disease, or unspecified chronic kidney disease: Secondary | ICD-10-CM | POA: Diagnosis present

## 2020-12-09 DIAGNOSIS — Z79899 Other long term (current) drug therapy: Secondary | ICD-10-CM

## 2020-12-09 DIAGNOSIS — E876 Hypokalemia: Secondary | ICD-10-CM | POA: Diagnosis present

## 2020-12-09 DIAGNOSIS — Z7983 Long term (current) use of bisphosphonates: Secondary | ICD-10-CM

## 2020-12-09 DIAGNOSIS — L089 Local infection of the skin and subcutaneous tissue, unspecified: Secondary | ICD-10-CM

## 2020-12-09 DIAGNOSIS — Z043 Encounter for examination and observation following other accident: Secondary | ICD-10-CM | POA: Diagnosis not present

## 2020-12-09 HISTORY — DX: Type 2 diabetes mellitus without complications: E11.9

## 2020-12-09 LAB — CBC WITH DIFFERENTIAL/PLATELET
Abs Immature Granulocytes: 0.04 10*3/uL (ref 0.00–0.07)
Basophils Absolute: 0 10*3/uL (ref 0.0–0.1)
Basophils Relative: 0 %
Eosinophils Absolute: 0.2 10*3/uL (ref 0.0–0.5)
Eosinophils Relative: 2 %
HCT: 39.6 % (ref 36.0–46.0)
Hemoglobin: 12.6 g/dL (ref 12.0–15.0)
Immature Granulocytes: 0 %
Lymphocytes Relative: 19 %
Lymphs Abs: 2 10*3/uL (ref 0.7–4.0)
MCH: 30.3 pg (ref 26.0–34.0)
MCHC: 31.8 g/dL (ref 30.0–36.0)
MCV: 95.2 fL (ref 80.0–100.0)
Monocytes Absolute: 1 10*3/uL (ref 0.1–1.0)
Monocytes Relative: 10 %
Neutro Abs: 7.2 10*3/uL (ref 1.7–7.7)
Neutrophils Relative %: 69 %
Platelets: 264 10*3/uL (ref 150–400)
RBC: 4.16 MIL/uL (ref 3.87–5.11)
RDW: 14.5 % (ref 11.5–15.5)
WBC: 10.4 10*3/uL (ref 4.0–10.5)
nRBC: 0 % (ref 0.0–0.2)

## 2020-12-09 LAB — COMPREHENSIVE METABOLIC PANEL
ALT: 8 U/L (ref 0–44)
AST: 13 U/L — ABNORMAL LOW (ref 15–41)
Albumin: 4 g/dL (ref 3.5–5.0)
Alkaline Phosphatase: 88 U/L (ref 38–126)
Anion gap: 13 (ref 5–15)
BUN: 40 mg/dL — ABNORMAL HIGH (ref 8–23)
CO2: 24 mmol/L (ref 22–32)
Calcium: 9.9 mg/dL (ref 8.9–10.3)
Chloride: 102 mmol/L (ref 98–111)
Creatinine, Ser: 1.22 mg/dL — ABNORMAL HIGH (ref 0.44–1.00)
GFR, Estimated: 42 mL/min — ABNORMAL LOW (ref >60)
Glucose, Bld: 200 mg/dL — ABNORMAL HIGH (ref 70–99)
Potassium: 3.7 mmol/L (ref 3.5–5.1)
Sodium: 139 mmol/L (ref 135–145)
Total Bilirubin: 1.1 mg/dL (ref 0.3–1.2)
Total Protein: 6.9 g/dL (ref 6.5–8.1)

## 2020-12-09 LAB — CK: Total CK: 35 U/L — ABNORMAL LOW (ref 38–234)

## 2020-12-09 LAB — RESP PANEL BY RT-PCR (FLU A&B, COVID) ARPGX2
Influenza A by PCR: NEGATIVE
Influenza B by PCR: NEGATIVE
SARS Coronavirus 2 by RT PCR: NEGATIVE

## 2020-12-09 LAB — LACTIC ACID, PLASMA: Lactic Acid, Venous: 1.5 mmol/L (ref 0.5–1.9)

## 2020-12-09 LAB — BRAIN NATRIURETIC PEPTIDE: B Natriuretic Peptide: 104.6 pg/mL — ABNORMAL HIGH (ref 0.0–100.0)

## 2020-12-09 MED ORDER — VANCOMYCIN HCL IN DEXTROSE 1-5 GM/200ML-% IV SOLN
1000.0000 mg | Freq: Once | INTRAVENOUS | Status: AC
  Administered 2020-12-09: 1000 mg via INTRAVENOUS

## 2020-12-09 NOTE — Progress Notes (Signed)
  [facility] will assume care on arrival to accepting facility. Until arrival, care as per EDP. However, [facility] available 24/7 for questions and assistance.   Nursing staff please page [facility] Admits and Consults ([phone]) as soon as the patient arrives to the hospital.  Michaline Kindig, DO [facility]  

## 2020-12-09 NOTE — ED Triage Notes (Signed)
Pt arrives pov from [facility], c/o Right knee pain and weakness. Pt currently taking abx started on 11/27, swelling and discoloration noted.

## 2020-12-09 NOTE — ED Notes (Signed)
IVF Vancocin initiated to not delay Patient Care

## 2020-12-09 NOTE — ED Notes (Signed)
Patient currently requesting to not be changed into [facility] and to remain in [facility]. Patient informed of Admission and Transfer Process and still would like to remain in [facility] and [place]. Patient provided with Snacks and PO Intake as requested at this Time. MD Zackowski made aware.

## 2020-12-09 NOTE — ED Notes (Signed)
Patient transported to XRAY 

## 2020-12-09 NOTE — ED Notes (Signed)
Patient made aware of Bed Assignment. Patient signed Transfer Consent willingly.

## 2020-12-09 NOTE — ED Provider Notes (Addendum)
[place] EMERGENCY DEPT Provider Note   CSN: [id number] Arrival date & time: 12/09/20  1433     History Chief Complaint  Patient presents with   Wound Check    Kelsey Salinas is a 87 y.o. female.  Patient from [facility].  Patient seen in the [facility] emergency department on November 12 following a fall onto the right knee.  Had a lot of ecchymosis to the knee at that time x-rays were negative other than tricompartmental degenerative changes.  Patient seen by [facility] on November 21.  And seen again on November 26.  Patient was treated with doxycycline.  No real improvement.  So that is why they are here today.  She started taking the antibiotic on November 27.  Patient developed purulent discharge from the left knee area.  But able to bend the knee fairly well.  She is followed by [facility] but has not seen them for this in injury.  Dates that the redness is spread from the knee all the way down to the ankle.  Patient not on any blood thinners.  Past medical history is significant for high cholesterol radial rotator cuff tear hypertension osteopenia diverticulosis chronic diastolic heart failure hypertension hypothyroidism and additional patient's had a total hip arthroscopy plasty on the right and open reduction internal fixation of the right wrist fracture.      Past Medical History:  Diagnosis Date   Childhood asthma    Chronic diastolic heart failure [facility]) 12/25/2012   October 2013 echocardiogram 1. There is moderate concentric left ventricle hypertrophy. 2. Left ventricular ejection fraction estimated by 2D at 60-65 percent. 3. There were no regional wall motion abnormalities. 4. Mild mitral annular calcification. 5. Mild mitral valve regurgitation. 6. There is mild tricuspid regurgitation. 7. Mildly elevated estimated right ventricular systolic pressure. 8. Right ventricular systolic pressure estimated at 35-40 mm Hg. 9. Mild calcification of the  aortic valve. 10. Trace aortic valve regurgitation. 11. Analysis of mitral valve inflow, pulmonary vein Doppler and tissue Doppler suggests grade I diastolic dysfunction without elevated left atrial pressure.    Colon polyp    Diverticular stricture ([place]) 2009   Diverticulosis 10-30-2007   Edema 09/12/13   Fall 05/18/2017   tripped over wheel of walker; superior and inferior pubic ramus fractures    Hypercholesterolemia    Hypertension    Hypertension, essential    Hypothyroidism    as a teenager   Indigestion    Insomnia    OA (osteoarthritis) of hip    right hip replacement, OA in bilateral knees   Osteopenia    Actonel stopped in 2012 after 5years therapy   Pneumonia 1939   "hospitalized"   Rapid resting heart rate 15/40/0867   Uncertain cause. Possible ectopic atrial tachycardia. Date entered 12/25/12    Right rotator cuff tear    Seasonal allergies     Patient Active Problem List   Diagnosis Date Noted   Memory loss, short term 12/13/2018   DDD (degenerative disc disease), cervical 12/13/2018   B12 deficiency 12/13/2018   Chronic cough 07/25/2017   Abnormal MMSE 07/25/2017   Type II diabetes mellitus with stage 3 chronic kidney disease ([place]) 05/31/2017   Sinus tachycardia    Multiple fractures of ramus of right pubis with routine healing    Closed pelvic fracture ([place]) 05/18/2017   Bilateral chronic knee pain 01/18/2017   Bilateral primary osteoarthritis of knee 04/07/2016   Hyperglycemia 10/01/2015   Fracture of 5th metatarsal 02/14/2015  CKD (chronic kidney disease) stage 3, GFR 30-59 ml/min (HCC) 12/10/2013   Sebaceous cyst 12/10/2013   Insomnia    Colon polyp    Diverticular stricture ([place])    Osteopenia    Vitamin D deficiency 10/31/2013   Indigestion    Primary osteoarthritis of hip    Chronic diastolic heart failure ([place]) 12/25/2012   Rapid resting heart rate 12/25/2012   Essential hypertension 12/25/2012   Hypercholesterolemia     Past Surgical  History:  Procedure Laterality Date   CATARACT EXTRACTION W/ INTRAOCULAR LENS  IMPLANT, BILATERAL Bilateral    COLONOSCOPY  2009   Dr. Redmond [facility]   DILATION AND CURETTAGE OF UTERUS     FRACTURE SURGERY     JOINT REPLACEMENT     ORIF WRIST FRACTURE Right    REDUCTION MAMMAPLASTY  1999   TOTAL HIP ARTHROPLASTY Right 03-04-08   Dr. Wynelle Link     OB History   No obstetric history on file.     Family History  Problem Relation Age of Onset   Heart disease Mother    Heart disease Father    Heart disease Brother     Social History   Tobacco Use   Smoking status: Former    Packs/day: 0.12    Years: 50.00    Pack years: 6.00    Types: Cigarettes   Smokeless tobacco: Never   Tobacco comments:    "stopped in the ate 1990s"  Vaping Use   Vaping Use: Never used  Substance Use Topics   Alcohol use: Yes    Comment: rarely   Drug use: Never    Home Medications Prior to Admission medications   Medication Sig Start Date End Date Taking? Authorizing Provider  acetaminophen (TYLENOL) 500 MG tablet Take 1 tablet (500 mg total) by mouth daily. 09/05/19   Reed, Tiffany L, DO  alendronate (FOSAMAX) 70 MG tablet TAKE 1 TAB ONCE A WEEK, AT LEAST 30 MIN BEFORE 1ST FOOD.DO NOT LIE DOWN FOR 30 MIN AFTER TAKING. 04/06/18   Reed, Tiffany L, DO  atorvastatin (LIPITOR) 40 MG tablet Take 1 tablet (40 mg total) by mouth daily. 01/09/20   Reed, Tiffany L, DO  Cholecalciferol (VITAMIN D PO) Take 1 tablet by mouth every 7 (seven) days. 50,000 units    [provider]  diclofenac sodium (VOLTAREN) 1 % GEL Apply 2 g topically every 6 (six) hours as needed.    [provider]  donepezil (ARICEPT) 10 MG tablet TAKE ONE TABLET BY MOUTH AT BEDTIME 07/10/20   Virgie Dad, MD  famotidine (PEPCID) 20 MG tablet Take 20 mg by mouth daily.    [provider]  fexofenadine (ALLEGRA) 180 MG tablet Take 1 tablet (180 mg total) by mouth daily. 06/02/20   Royal Hawthorn, NP  hydrochlorothiazide  (HYDRODIURIL) 25 MG tablet TAKE 1 TABLET ONCE A DAY WITH LOSARTAN UNTIL COMBO TABLET BACK AVAILABLE. 10/21/20   Virgie Dad, MD  losartan (COZAAR) 100 MG tablet TAKE 1 TABLET DAILY WITH HCTZ TIL COMBINATION PILL AVAILABLE. 10/21/20   Virgie Dad, MD  memantine (NAMENDA) 10 MG tablet TAKE 1 TABLET BY MOUTH TWICE DAILY. 09/25/20   Virgie Dad, MD  metoprolol succinate (TOPROL-XL) 100 MG 24 hr tablet TAKE 1&1/2 TABLETS TWICE DAILY. 07/21/20   Belva Crome, MD  polyethylene glycol powder [facility]) 17 GM/SCOOP powder polyethylene glycol 3350 17 gram oral powder packet   17 g by oral route. 05/20/17   [provider]  sodium  chloride (OCEAN) 0.65 % SOLN nasal spray Place 1 spray into both nostrils as needed for congestion. 06/02/20   Royal Hawthorn, NP  triamcinolone (NASACORT) 55 MCG/ACT AERO nasal inhaler Place 2 sprays into the nose daily. 06/02/20   Royal Hawthorn, NP  vitamin B-12 (CYANOCOBALAMIN) 1000 MCG tablet Take 1 tablet (1,000 mcg total) by mouth daily. 12/16/17   Reed, Tiffany L, DO    Allergies    Amlodipine besylate, Lisinopril, and Sulfamethoxazole-trimethoprim  Review of Systems   Review of Systems  Constitutional:  Negative for chills and fever.  HENT:  Negative for ear pain and sore throat.   Eyes:  Negative for pain and visual disturbance.  Respiratory:  Negative for cough and shortness of breath.   Cardiovascular:  Negative for chest pain and palpitations.  Gastrointestinal:  Negative for abdominal pain and vomiting.  Genitourinary:  Negative for dysuria and hematuria.  Musculoskeletal:  Negative for arthralgias and back pain.  Skin:  Positive for wound. Negative for color change and rash.  Neurological:  Negative for seizures and syncope.  All other systems reviewed and are negative.  Physical Exam Updated Vital Signs BP (!) 123/99   Pulse (!) 109   Temp 97.9 F (36.6 C)   Resp (!) 21   Ht 1.727 m (5\' 8" )   Wt 77.1 kg   SpO2 97%   BMI  25.85 kg/m   Physical Exam Vitals and nursing note reviewed.  Constitutional:      General: She is not in acute distress.    Appearance: Normal appearance. She is well-developed.  HENT:     Head: Normocephalic and atraumatic.  Eyes:     Conjunctiva/sclera: Conjunctivae normal.  Cardiovascular:     Rate and Rhythm: Normal rate and regular rhythm.     Heart sounds: No murmur heard. Pulmonary:     Effort: Pulmonary effort is normal. No respiratory distress.     Breath sounds: Normal breath sounds. No wheezing, rhonchi or rales.  Abdominal:     Palpations: Abdomen is soft.     Tenderness: There is no abdominal tenderness.  Musculoskeletal:        General: Swelling present.     Cervical back: Normal range of motion and neck supple.     Right lower leg: Edema present.     Left lower leg: No edema.     Comments: Erythema from the knee all the way to the ankle of the right lower extremity.  Some abrasions related to the fall.  But 1 on the top of the knee is has a purulent serosanguineous discharge.  Some swelling to the knee.  Good range of motion of the knee clinically do not think that there is a joint infection.  Distally neurovascularly intact as far as good cap refill and sensation.  Left leg without any edema.  Right leg does have some edema.  Skin:    General: Skin is warm and dry.     Capillary Refill: Capillary refill takes less than 2 seconds.  Neurological:     General: No focal deficit present.     Mental Status: She is alert and oriented to person, place, and time.     Cranial Nerves: No cranial nerve deficit.     Sensory: No sensory deficit.  Psychiatric:        Mood and Affect: Mood normal.    ED Results / Procedures / Treatments   Labs (all labs ordered are listed, but only abnormal results are displayed) Labs  Reviewed  RESP PANEL BY RT-PCR (FLU A&B, COVID) ARPGX2  CULTURE, BLOOD (ROUTINE X 2)  CULTURE, BLOOD (ROUTINE X 2)  CBC WITH DIFFERENTIAL/PLATELET  BRAIN  NATRIURETIC PEPTIDE  CK  COMPREHENSIVE METABOLIC PANEL  LACTIC ACID, PLASMA    EKG EKG Interpretation  Date/Time:  Tuesday December 09 2020 19:20:36 EST Ventricular Rate:  110 PR Interval:  116 QRS Duration: 86 QT Interval:  335 QTC Calculation: 454 R Axis:   7 Text Interpretation: Sinus tachycardia Borderline ST depression, diffuse leads Confirmed by Fredia Sorrow [phone]) on 12/09/2020 7:44:29 PM  Radiology DG Chest 2 View  Result Date: 12/09/2020 CLINICAL DATA:  Fall. EXAM: CHEST - 2 VIEW COMPARISON:  Chest x-ray 05/06/2016. FINDINGS: The heart size and mediastinal contours are within normal limits. Both lungs are clear. The visualized skeletal structures are unremarkable. IMPRESSION: No active cardiopulmonary disease. Electronically Signed   By: Ronney Asters M.D.   On: 12/09/2020 19:27   DG Knee Complete 4 Views Right  Result Date: 12/09/2020 CLINICAL DATA:  Right knee pain. EXAM: RIGHT KNEE - COMPLETE 4+ VIEW COMPARISON:  Right knee radiograph dated 11/22/2020. FINDINGS: There is no acute fracture or dislocation. The bones are osteopenic. Severe arthritic changes with tricompartmental narrowing. No joint effusion. Mild diffuse subcutaneous edema. IMPRESSION: 1. No acute fracture or dislocation. 2. Severe arthritic changes. Electronically Signed   By: Anner Crete M.D.   On: 12/09/2020 19:27    Procedures Procedures   Medications Ordered in ED Medications  vancomycin (VANCOCIN) IVPB 1000 mg/200 mL premix (1,000 mg Intravenous New Bag/Given 12/09/20 1939)    ED Course  I have reviewed the triage vital signs and the nursing notes.  Pertinent labs & imaging results that were available during my care of the patient were reviewed by me and considered in my medical decision making (see chart for details).    MDM Rules/Calculators/A&P                         CRITICAL CARE Performed by: Fredia Sorrow Total critical care time: 35 minutes Critical care time was  exclusive of separately billable procedures and treating other patients. Critical care was necessary to treat or prevent imminent or life-threatening deterioration. Critical care was time spent personally by me on the following activities: development of treatment plan with patient and/or surrogate as well as nursing, discussions with consultants, evaluation of patient's response to treatment, examination of patient, obtaining history from patient or surrogate, ordering and performing treatments and interventions, ordering and review of laboratory studies, ordering and review of radiographic studies, pulse oximetry and re-evaluation of patient's condition.   Repeat x-ray here today shows just the recurrent finding of the tricompartmental degenerative changes.  No evidence of any gas in the joint.  No evidence of any osteomyelitis.  Labs no leukocytosis hemoglobin is 12.6.  COVID and influenza testing is negative.  Chest x-ray negative for any acute acute findings to include no evidence of pulmonary edema.  CK in process BNP in process complete metabolic panel in process lactic acid in process and blood cultures have been sent.  Patient nontoxic no acute distress.  Do not feel that patient has a deep abscess to the knee.  I think this infection is related in the skin and is draining well does not need to be I&D it is actually opened sore with measurement of about 1 cm opening.  Since patient had been on doxycycline we will go and start vancomycin for purulent  cellulitis.  Will discuss with orthopedics [facility] and seen her before just to see if they want to see her in consultation.  We will arrange admission to the hospitalist service for of the cellulitis of the leg and the infection to the skin of the knee.  Discussed with Dr. Ihor Gully on-call for [facility] they will see her in consultation.  Waiting hospitalist to call for admission [facility] would prefer [facility] admission.   Final Clinical  Impression(s) / ED Diagnoses Final diagnoses:  Skin infection of left knee  Cellulitis of left lower extremity    Rx / DC Orders ED Discharge Orders     None        Fredia Sorrow, MD 12/09/20 2103    Fredia Sorrow, MD 12/09/20 2147

## 2020-12-09 NOTE — ED Notes (Signed)
Patient refused to have Second Blood Culture Set obtained at this Time.

## 2020-12-09 NOTE — ED Notes (Signed)
Care Handoff/ Report given to [place], RN at [facility]. All Questions answered.

## 2020-12-10 ENCOUNTER — Encounter (HOSPITAL_COMMUNITY): Payer: Self-pay | Admitting: Internal Medicine

## 2020-12-10 ENCOUNTER — Inpatient Hospital Stay (HOSPITAL_COMMUNITY): Payer: Medicare Other

## 2020-12-10 DIAGNOSIS — I5032 Chronic diastolic (congestive) heart failure: Secondary | ICD-10-CM | POA: Diagnosis present

## 2020-12-10 DIAGNOSIS — S81011A Laceration without foreign body, right knee, initial encounter: Secondary | ICD-10-CM | POA: Diagnosis not present

## 2020-12-10 DIAGNOSIS — L0390 Cellulitis, unspecified: Secondary | ICD-10-CM | POA: Diagnosis present

## 2020-12-10 DIAGNOSIS — E876 Hypokalemia: Secondary | ICD-10-CM | POA: Diagnosis present

## 2020-12-10 DIAGNOSIS — E7800 Pure hypercholesterolemia, unspecified: Secondary | ICD-10-CM | POA: Diagnosis present

## 2020-12-10 DIAGNOSIS — Z7983 Long term (current) use of bisphosphonates: Secondary | ICD-10-CM | POA: Diagnosis not present

## 2020-12-10 DIAGNOSIS — L03115 Cellulitis of right lower limb: Principal | ICD-10-CM

## 2020-12-10 DIAGNOSIS — Z87891 Personal history of nicotine dependence: Secondary | ICD-10-CM | POA: Diagnosis not present

## 2020-12-10 DIAGNOSIS — Z8249 Family history of ischemic heart disease and other diseases of the circulatory system: Secondary | ICD-10-CM | POA: Diagnosis not present

## 2020-12-10 DIAGNOSIS — E039 Hypothyroidism, unspecified: Secondary | ICD-10-CM | POA: Diagnosis present

## 2020-12-10 DIAGNOSIS — N1832 Chronic kidney disease, stage 3b: Secondary | ICD-10-CM

## 2020-12-10 DIAGNOSIS — Z66 Do not resuscitate: Secondary | ICD-10-CM | POA: Diagnosis present

## 2020-12-10 DIAGNOSIS — E538 Deficiency of other specified B group vitamins: Secondary | ICD-10-CM | POA: Diagnosis present

## 2020-12-10 DIAGNOSIS — L03116 Cellulitis of left lower limb: Secondary | ICD-10-CM | POA: Diagnosis present

## 2020-12-10 DIAGNOSIS — E1122 Type 2 diabetes mellitus with diabetic chronic kidney disease: Secondary | ICD-10-CM | POA: Diagnosis present

## 2020-12-10 DIAGNOSIS — Z96641 Presence of right artificial hip joint: Secondary | ICD-10-CM | POA: Diagnosis present

## 2020-12-10 DIAGNOSIS — I130 Hypertensive heart and chronic kidney disease with heart failure and stage 1 through stage 4 chronic kidney disease, or unspecified chronic kidney disease: Secondary | ICD-10-CM | POA: Diagnosis present

## 2020-12-10 DIAGNOSIS — Z79899 Other long term (current) drug therapy: Secondary | ICD-10-CM | POA: Diagnosis not present

## 2020-12-10 DIAGNOSIS — N1831 Chronic kidney disease, stage 3a: Secondary | ICD-10-CM

## 2020-12-10 DIAGNOSIS — Z20822 Contact with and (suspected) exposure to covid-19: Secondary | ICD-10-CM | POA: Diagnosis present

## 2020-12-10 LAB — CBC WITH DIFFERENTIAL/PLATELET
Abs Immature Granulocytes: 0.05 10*3/uL (ref 0.00–0.07)
Basophils Absolute: 0.1 10*3/uL (ref 0.0–0.1)
Basophils Relative: 1 %
Eosinophils Absolute: 0.2 10*3/uL (ref 0.0–0.5)
Eosinophils Relative: 2 %
HCT: 38.3 % (ref 36.0–46.0)
Hemoglobin: 12.2 g/dL (ref 12.0–15.0)
Immature Granulocytes: 1 %
Lymphocytes Relative: 18 %
Lymphs Abs: 1.8 10*3/uL (ref 0.7–4.0)
MCH: 31.2 pg (ref 26.0–34.0)
MCHC: 31.9 g/dL (ref 30.0–36.0)
MCV: 98 fL (ref 80.0–100.0)
Monocytes Absolute: 1 10*3/uL (ref 0.1–1.0)
Monocytes Relative: 9 %
Neutro Abs: 7 10*3/uL (ref 1.7–7.7)
Neutrophils Relative %: 69 %
Platelets: 209 10*3/uL (ref 150–400)
RBC: 3.91 MIL/uL (ref 3.87–5.11)
RDW: 13.7 % (ref 11.5–15.5)
WBC: 10.1 10*3/uL (ref 4.0–10.5)
nRBC: 0 % (ref 0.0–0.2)

## 2020-12-10 LAB — BASIC METABOLIC PANEL
Anion gap: 8 (ref 5–15)
BUN: 42 mg/dL — ABNORMAL HIGH (ref 8–23)
CO2: 24 mmol/L (ref 22–32)
Calcium: 8.5 mg/dL — ABNORMAL LOW (ref 8.9–10.3)
Chloride: 105 mmol/L (ref 98–111)
Creatinine, Ser: 1.22 mg/dL — ABNORMAL HIGH (ref 0.44–1.00)
GFR, Estimated: 42 mL/min — ABNORMAL LOW (ref >60)
Glucose, Bld: 122 mg/dL — ABNORMAL HIGH (ref 70–99)
Potassium: 3.3 mmol/L — ABNORMAL LOW (ref 3.5–5.1)
Sodium: 137 mmol/L (ref 135–145)

## 2020-12-10 LAB — GLUCOSE, CAPILLARY
Glucose-Capillary: 137 mg/dL — ABNORMAL HIGH (ref 70–99)
Glucose-Capillary: 132 mg/dL — ABNORMAL HIGH (ref 70–99)
Glucose-Capillary: 115 mg/dL — ABNORMAL HIGH (ref 70–99)
Glucose-Capillary: 170 mg/dL — ABNORMAL HIGH (ref 70–99)
Glucose-Capillary: 178 mg/dL — ABNORMAL HIGH (ref 70–99)

## 2020-12-10 MED ORDER — ENOXAPARIN SODIUM 40 MG/0.4ML IJ SOSY
40.0000 mg | PREFILLED_SYRINGE | INTRAMUSCULAR | Status: DC
  Administered 2020-12-10 – 2020-12-12 (×3): 40 mg via SUBCUTANEOUS
  Filled 2020-12-10 (×3): qty 0.4

## 2020-12-10 MED ORDER — METOPROLOL SUCCINATE ER 50 MG PO TB24
150.0000 mg | ORAL_TABLET | Freq: Two times a day (BID) | ORAL | Status: DC
  Administered 2020-12-11 – 2020-12-12 (×3): 150 mg via ORAL
  Filled 2020-12-10 (×3): qty 3

## 2020-12-10 MED ORDER — ACETAMINOPHEN 650 MG RE SUPP: 650.0000 mg | Freq: Four times a day (QID) | RECTAL | Status: DC | PRN

## 2020-12-10 MED ORDER — ACETAMINOPHEN 325 MG PO TABS
650.0000 mg | ORAL_TABLET | Freq: Four times a day (QID) | ORAL | Status: DC | PRN
  Filled 2020-12-10: qty 2

## 2020-12-10 MED ORDER — SODIUM CHLORIDE 0.9 % IV SOLN
2.0000 g | Freq: Two times a day (BID) | INTRAVENOUS | Status: DC
  Administered 2020-12-10 – 2020-12-12 (×5): 2 g via INTRAVENOUS
  Filled 2020-12-10 (×6): qty 2

## 2020-12-10 MED ORDER — HYDROCHLOROTHIAZIDE 25 MG PO TABS
25.0000 mg | ORAL_TABLET | Freq: Every day | ORAL | Status: DC
  Administered 2020-12-10 – 2020-12-12 (×3): 25 mg via ORAL
  Filled 2020-12-10 (×3): qty 1

## 2020-12-10 MED ORDER — INSULIN ASPART 100 UNIT/ML IJ SOLN
0.0000 [IU] | Freq: Three times a day (TID) | INTRAMUSCULAR | Status: DC
  Administered 2020-12-10 (×2): 1 [IU] via SUBCUTANEOUS
  Administered 2020-12-11: 2 [IU] via SUBCUTANEOUS

## 2020-12-10 MED ORDER — METOPROLOL SUCCINATE ER 50 MG PO TB24
150.0000 mg | ORAL_TABLET | Freq: Every day | ORAL | Status: DC
  Administered 2020-12-10: 150 mg via ORAL
  Filled 2020-12-10: qty 3

## 2020-12-10 MED ORDER — DONEPEZIL HCL 10 MG PO TABS
10.0000 mg | ORAL_TABLET | Freq: Every day | ORAL | Status: DC
  Administered 2020-12-11: 10 mg via ORAL
  Filled 2020-12-10: qty 1

## 2020-12-10 MED ORDER — VANCOMYCIN HCL 1250 MG/250ML IV SOLN
1250.0000 mg | INTRAVENOUS | Status: DC
  Administered 2020-12-11: 1250 mg via INTRAVENOUS
  Filled 2020-12-10: qty 250

## 2020-12-10 MED ORDER — VANCOMYCIN HCL 1000 MG/200ML IV SOLN: 1000.0000 mg | Freq: Once | INTRAVENOUS | Status: DC

## 2020-12-10 MED ORDER — FAMOTIDINE 20 MG PO TABS
ORAL_TABLET | ORAL | Status: AC
  Filled 2020-12-10: qty 1

## 2020-12-10 MED ORDER — POTASSIUM CHLORIDE CRYS ER 20 MEQ PO TBCR
40.0000 meq | EXTENDED_RELEASE_TABLET | Freq: Once | ORAL | Status: AC
  Administered 2020-12-10: 40 meq via ORAL
  Filled 2020-12-10: qty 2

## 2020-12-10 MED ORDER — MEMANTINE HCL 10 MG PO TABS
10.0000 mg | ORAL_TABLET | Freq: Two times a day (BID) | ORAL | Status: DC
  Administered 2020-12-10 – 2020-12-12 (×4): 10 mg via ORAL
  Filled 2020-12-10 (×4): qty 1

## 2020-12-10 MED ORDER — VITAMIN B-12 1000 MCG PO TABS: 1000.0000 ug | ORAL_TABLET | Freq: Every day | ORAL | Status: DC

## 2020-12-10 MED ORDER — LOSARTAN POTASSIUM 50 MG PO TABS
100.0000 mg | ORAL_TABLET | Freq: Every day | ORAL | Status: DC
  Administered 2020-12-10 – 2020-12-12 (×3): 100 mg via ORAL
  Filled 2020-12-10 (×3): qty 2

## 2020-12-10 MED ORDER — FAMOTIDINE 20 MG PO TABS: 20.0000 mg | ORAL_TABLET | Freq: Every day | ORAL | Status: DC

## 2020-12-10 MED ORDER — ATORVASTATIN CALCIUM 40 MG PO TABS
40.0000 mg | ORAL_TABLET | Freq: Every day | ORAL | Status: DC
  Administered 2020-12-10 – 2020-12-12 (×3): 40 mg via ORAL
  Filled 2020-12-10 (×3): qty 1

## 2020-12-10 NOTE — Progress Notes (Signed)
Pharmacy Antibiotic Note  Reka Wist is a 87 y.o. female admitted on 12/09/2020 with purulent discharge from left knee area.  Pt had a fall on 11/12 and had been taking doxy with no real improvement.  Pharmacy has been consulted to dose vancomycin and cefepime for cellulitis  Plan: Vancomycin 1gm Iv x 1 then 1250mg  q36h (AUC 517, Scr 1.22) Cefepime 2gm IV q12h Follow renal function and clinical course  Height: 5\' 8"  (172.7 cm) Weight: 77.1 kg (170 lb) IBW/kg (Calculated) : 63.9  Temp (24hrs), Avg:97.5 F (36.4 C), Min:96.5 F (35.8 C), Max:97.9 F (36.6 C)  Recent Labs  Lab 12/09/20 1858 12/09/20 2040  WBC 10.4  --   CREATININE  --  1.22*  LATICACIDVEN  --  1.5    Estimated Creatinine Clearance: 32.1 mL/min (A) (by C-G formula based on SCr of 1.22 mg/dL (H)).    Allergies  Allergen Reactions   Amlodipine Besylate Other (See Comments)    Edema   Lisinopril Cough    Cough    Sulfamethoxazole-Trimethoprim Other (See Comments)    Unknown reaction     Antimicrobials this admission: 11/29 vanc >> 11/30 cefepime >>  Dose adjustments this admission:   Microbiology results: 11/29 BCx:    Thank you for allowing pharmacy to be a part of this patient's care.  Dolly Rias RPh 12/10/2020, 3:30 AM

## 2020-12-10 NOTE — Progress Notes (Addendum)
Same day note  Patient seen and examined at bedside.  Patient was admitted to the hospital for right knee skin area swelling and pain.  At the time of my evaluation, patient complains of pain over the leg.  Physical examination reveals serosanguineous discharge from the right knee wound with erythema extending all the way to the ankle with edema and tenderness.  Laboratory data and imaging was reviewed  Assessment and Plan.  Right knee and leg cellulitis.  Purulent discharge noted on the superficial skin on exam.  Patient does have joint movement but is extremely tender.  Dr. Alvan Dame was notified from the ED.  We will follow recommendation.   X-ray does not show any bony involvement.  Does not look like septic arthritis at this time.  Continue with IV antibiotic for now with vancomycin and cefepime.  Follow blood cultures.  Diabetes mellitus type 2.  Hemoglobin A1c of 7, 2 weeks back.  Continue on sliding scale insulin, Accu-Cheks, diabetic diet.  Closely monitor blood glucose levels.  Essential hypertension.   Continue Cozaar, hydrochlorothiazide, metoprolol.  Closely monitor blood pressure.  Mild hypokalemia.  We will replenish.  Check levels in a.m.  Chronic kidney disease stage IIIa.  Continue ARB and hydrochlorothiazide.  Will monitor BMP.  Hyperlipidemia.  Continue statins  History of diastolic congestive heart failure.  Currently compensated.  DVT prophylaxis.  On Lovenox.  Spoke with the patient's son at bedside.  No Charge  Signed,  Delila Pereyra, MD [facility]

## 2020-12-10 NOTE — Progress Notes (Signed)
BLE venous duplex has been completed.  Results can be found under chart review under CV PROC. 12/10/2020 11:53 AM Deshay Kirstein RVT, RDMS

## 2020-12-10 NOTE — H&P (Addendum)
History and Physical    Kelsey Salinas NIO:[id number] DOB: 05/04/1926 DOA: 12/09/2020  PCP: Virgie Dad, MD  Patient coming from: [facility].  Chief Complaint: Right knee skin area swelling and pain.  HPI: Kelsey Salinas is a 87 y.o. female with history of diabetes mellitus type 2, hypertension, hyperlipidemia had a fall about a week ago since then has been having some worsening pain and swelling of the right knee area.  Patient when she had a fall did sustain a small laceration around the skin around the knee.  Lately that area has become swollen with some discharge was prescribed doxycycline despite which the swelling has become worse.  Was advised to come to the ER.  ED Course: In the ER x-rays do not show any bone or joint involvement.  Patient's leg looks swollen and erythematous particularly on the right knee area with some discharge which is serosanguineous.  Labs are largely unremarkable with WBC of 10.1 blood glucose of 200 and creatinine of 1.2.  COVID test negative.  Patient started on empiric antibiotics admitted for cellulitis.  ER physician did discuss with orthopedic surgeon Dr. [place] Crane who will see patient in consult.  No definite signs of any septic arthritis at this time.  Review of Systems: As per HPI, rest all negative.   Past Medical History:  Diagnosis Date   Childhood asthma    Chronic diastolic heart failure [facility]) 12/25/2012   October 2013 echocardiogram 1. There is moderate concentric left ventricle hypertrophy. 2. Left ventricular ejection fraction estimated by 2D at 60-65 percent. 3. There were no regional wall motion abnormalities. 4. Mild mitral annular calcification. 5. Mild mitral valve regurgitation. 6. There is mild tricuspid regurgitation. 7. Mildly elevated estimated right ventricular systolic pressure. 8. Right ventricular systolic pressure estimated at 35-40 mm Hg. 9. Mild calcification of the aortic valve. 10. Trace aortic  valve regurgitation. 11. Analysis of mitral valve inflow, pulmonary vein Doppler and tissue Doppler suggests grade I diastolic dysfunction without elevated left atrial pressure.    Colon polyp    Diverticular stricture ([place]) 2009   Diverticulosis 10-30-2007   Edema 09/12/13   Fall 05/18/2017   tripped over wheel of walker; superior and inferior pubic ramus fractures    Hypercholesterolemia    Hypertension    Hypertension, essential    Hypothyroidism    as a teenager   Indigestion    Insomnia    OA (osteoarthritis) of hip    right hip replacement, OA in bilateral knees   Osteopenia    Actonel stopped in 2012 after 5years therapy   Pneumonia 1939   "hospitalized"   Rapid resting heart rate 81/82/9937   Uncertain cause. Possible ectopic atrial tachycardia. Date entered 12/25/12    Right rotator cuff tear    Seasonal allergies     Past Surgical History:  Procedure Laterality Date   CATARACT EXTRACTION W/ INTRAOCULAR LENS  IMPLANT, BILATERAL Bilateral    COLONOSCOPY  2009   Dr. Redmond [employer]   DILATION AND CURETTAGE OF UTERUS     FRACTURE SURGERY     JOINT REPLACEMENT     ORIF WRIST FRACTURE Right    REDUCTION MAMMAPLASTY  1999   TOTAL HIP ARTHROPLASTY Right 03-04-08   Dr. Wynelle Link     reports that she has quit smoking. Her smoking use included cigarettes. She has a 6.00 pack-year smoking history. She has never used smokeless tobacco. She reports current alcohol use. She reports that she does not use drugs.  Allergies  Allergen Reactions   Amlodipine Besylate Other (See Comments)    Edema   Lisinopril Cough    Cough    Sulfamethoxazole-Trimethoprim Other (See Comments)    Unknown reaction     Family History  Problem Relation Age of Onset   Heart disease Mother    Heart disease Father    Heart disease Brother     Prior to Admission medications   Medication Sig Start Date End Date Taking? Authorizing Provider  acetaminophen (TYLENOL) 500 MG tablet Take 1 tablet (500 mg  total) by mouth daily. 09/05/19   Reed, Tiffany L, DO  alendronate (FOSAMAX) 70 MG tablet TAKE 1 TAB ONCE A WEEK, AT LEAST 30 MIN BEFORE 1ST FOOD.DO NOT LIE DOWN FOR 30 MIN AFTER TAKING. 04/06/18   Reed, Tiffany L, DO  atorvastatin (LIPITOR) 40 MG tablet Take 1 tablet (40 mg total) by mouth daily. 01/09/20   Reed, Tiffany L, DO  Cholecalciferol (VITAMIN D PO) Take 1 tablet by mouth every 7 (seven) days. 50,000 units    [provider]  diclofenac sodium (VOLTAREN) 1 % GEL Apply 2 g topically every 6 (six) hours as needed.    [provider]  donepezil (ARICEPT) 10 MG tablet TAKE ONE TABLET BY MOUTH AT BEDTIME 07/10/20   Virgie Dad, MD  famotidine (PEPCID) 20 MG tablet Take 20 mg by mouth daily.    [provider]  fexofenadine (ALLEGRA) 180 MG tablet Take 1 tablet (180 mg total) by mouth daily. 06/02/20   Royal Hawthorn, NP  hydrochlorothiazide (HYDRODIURIL) 25 MG tablet TAKE 1 TABLET ONCE A DAY WITH LOSARTAN UNTIL COMBO TABLET BACK AVAILABLE. 10/21/20   Virgie Dad, MD  losartan (COZAAR) 100 MG tablet TAKE 1 TABLET DAILY WITH HCTZ TIL COMBINATION PILL AVAILABLE. 10/21/20   Virgie Dad, MD  memantine (NAMENDA) 10 MG tablet TAKE 1 TABLET BY MOUTH TWICE DAILY. 09/25/20   Virgie Dad, MD  metoprolol succinate (TOPROL-XL) 100 MG 24 hr tablet TAKE 1&1/2 TABLETS TWICE DAILY. 07/21/20   Belva Crome, MD  polyethylene glycol powder [facility]) 17 GM/SCOOP powder polyethylene glycol 3350 17 gram oral powder packet   17 g by oral route. 05/20/17   [provider]  sodium chloride (OCEAN) 0.65 % SOLN nasal spray Place 1 spray into both nostrils as needed for congestion. 06/02/20   Royal Hawthorn, NP  triamcinolone (NASACORT) 55 MCG/ACT AERO nasal inhaler Place 2 sprays into the nose daily. 06/02/20   Royal Hawthorn, NP  vitamin B-12 (CYANOCOBALAMIN) 1000 MCG tablet Take 1 tablet (1,000 mcg total) by mouth daily. 12/16/17   Gayland Curry, DO    Physical  Exam: Constitutional: Moderately built and nourished. Vitals:   12/09/20 2225 12/09/20 2230 12/10/20 0020 12/10/20 0203  BP: (!) 149/89  139/78 (!) 150/83  Pulse: (!) 101  (!) 107 95  Resp: 16  18 17   Temp:  97.8 F (36.6 C) 97.7 F (36.5 C) (!) 96.5 F (35.8 C)  TempSrc:  Oral Oral   SpO2: 99%  96% 98%  Weight:      Height:       Eyes: Anicteric no pallor. ENMT: No discharge from the ears eyes nose and mouth. Neck: No mass felt.  No neck rigidity. Respiratory: No rhonchi or crepitations. Cardiovascular: S1-S2 heard. Abdomen: Soft nontender bowel sound present. Musculoskeletal: Swelling around the right knee with some skin laceration and serosanguineous discharge. Skin: Swelling of the right knee with serosanguineous discharge from 2  wounds.  The skin erythema extends from the knee to the anterior shin.  Swelling extends up to the foot. Neurologic: Alert awake oriented to time place and person.  Moves all extremities. Psychiatric: Appears normal.  Normal affect.   Labs on Admission: I have personally reviewed following labs and imaging studies  CBC: Recent Labs  Lab 12/09/20 1858  WBC 10.4  NEUTROABS 7.2  HGB 12.6  HCT 39.6  MCV 95.2  PLT 789   Basic Metabolic Panel: Recent Labs  Lab 12/09/20 2040  NA 139  K 3.7  CL 102  CO2 24  GLUCOSE 200*  BUN 40*  CREATININE 1.22*  CALCIUM 9.9   GFR: Estimated Creatinine Clearance: 32.1 mL/min (A) (by C-G formula based on SCr of 1.22 mg/dL (H)). Liver Function Tests: Recent Labs  Lab 12/09/20 2040  AST 13*  ALT 8  ALKPHOS 88  BILITOT 1.1  PROT 6.9  ALBUMIN 4.0   No results for input(s): LIPASE, AMYLASE in the last 168 hours. No results for input(s): AMMONIA in the last 168 hours. Coagulation Profile: No results for input(s): INR, PROTIME in the last 168 hours. Cardiac Enzymes: Recent Labs  Lab 12/09/20 2040  CKTOTAL 35*   BNP (last 3 results) No results for input(s): PROBNP in the last 8760  hours. HbA1C: No results for input(s): HGBA1C in the last 72 hours. CBG: No results for input(s): GLUCAP in the last 168 hours. Lipid Profile: No results for input(s): CHOL, HDL, LDLCALC, TRIG, CHOLHDL, LDLDIRECT in the last 72 hours. Thyroid Function Tests: No results for input(s): TSH, T4TOTAL, FREET4, T3FREE, THYROIDAB in the last 72 hours. Anemia Panel: No results for input(s): VITAMINB12, FOLATE, FERRITIN, TIBC, IRON, RETICCTPCT in the last 72 hours. Urine analysis:    Component Value Date/Time   COLORURINE YELLOW 05/19/2017 0949   APPEARANCEUR CLEAR 05/19/2017 0949   LABSPEC 1.031 (H) 05/19/2017 0949   PHURINE 5.0 05/19/2017 0949   GLUCOSEU NEGATIVE 05/19/2017 0949   HGBUR NEGATIVE 05/19/2017 0949   BILIRUBINUR NEGATIVE 05/19/2017 0949   KETONESUR NEGATIVE 05/19/2017 0949   PROTEINUR NEGATIVE 05/19/2017 0949   UROBILINOGEN 0.2 03/05/2008 0957   NITRITE POSITIVE (A) 05/19/2017 0949   LEUKOCYTESUR NEGATIVE 05/19/2017 0949   Sepsis Labs: @LABRCNTIP (procalcitonin:4,lacticidven:4) ) Recent Results (from the past 240 hour(s))  Resp Panel by RT-PCR (Flu A&B, Covid) Nasopharyngeal Swab     Status: None   Collection Time: 12/09/20  6:58 PM   Specimen: Nasopharyngeal Swab; Nasopharyngeal(NP) swabs in vial transport medium  Result Value Ref Range Status   SARS Coronavirus 2 by RT PCR NEGATIVE NEGATIVE Final    Comment: (NOTE) SARS-CoV-2 target nucleic acids are NOT DETECTED.  The SARS-CoV-2 RNA is generally detectable in upper respiratory specimens during the acute phase of infection. The lowest concentration of SARS-CoV-2 viral copies this assay can detect is 138 copies/mL. A negative result does not preclude SARS-Cov-2 infection and should not be used as the sole basis for treatment or other patient management decisions. A negative result may occur with  improper specimen collection/handling, submission of specimen other than nasopharyngeal swab, presence of viral  mutation(s) within the areas targeted by this assay, and inadequate number of viral copies(<138 copies/mL). A negative result must be combined with clinical observations, patient history, and epidemiological information. The expected result is Negative.  Fact Sheet for Patients:  EntrepreneurPulse.com.au  Fact Sheet for Healthcare Providers:  IncredibleEmployment.be  This test is no t yet approved or cleared by the [place] FDA and  has been  authorized for detection and/or diagnosis of SARS-CoV-2 by FDA under an Emergency Use Authorization (EUA). This EUA will remain  in effect (meaning this test can be used) for the duration of the COVID-19 declaration under Section 564(b)(1) of the Act, 21 U.S.C.section 360bbb-3(b)(1), unless the authorization is terminated  or revoked sooner.       Influenza A by PCR NEGATIVE NEGATIVE Final   Influenza B by PCR NEGATIVE NEGATIVE Final    Comment: (NOTE) The Xpert Xpress SARS-CoV-2/FLU/RSV plus assay is intended as an aid in the diagnosis of influenza from Nasopharyngeal swab specimens and should not be used as a sole basis for treatment. Nasal washings and aspirates are unacceptable for Xpert Xpress SARS-CoV-2/FLU/RSV testing.  Fact Sheet for Patients: EntrepreneurPulse.com.au  Fact Sheet for Healthcare Providers: IncredibleEmployment.be  This test is not yet approved or cleared by the [place] FDA and has been authorized for detection and/or diagnosis of SARS-CoV-2 by FDA under an Emergency Use Authorization (EUA). This EUA will remain in effect (meaning this test can be used) for the duration of the COVID-19 declaration under Section 564(b)(1) of the Act, 21 U.S.C. section 360bbb-3(b)(1), unless the authorization is terminated or revoked.  Performed at [facility], [address], [place], [place] [address]      Radiological  Exams on Admission: DG Chest 2 View  Result Date: 12/09/2020 CLINICAL DATA:  Fall. EXAM: CHEST - 2 VIEW COMPARISON:  Chest x-ray 05/06/2016. FINDINGS: The heart size and mediastinal contours are within normal limits. Both lungs are clear. The visualized skeletal structures are unremarkable. IMPRESSION: No active cardiopulmonary disease. Electronically Signed   By: Ronney Asters M.D.   On: 12/09/2020 19:27   DG Knee Complete 4 Views Right  Result Date: 12/09/2020 CLINICAL DATA:  Right knee pain. EXAM: RIGHT KNEE - COMPLETE 4+ VIEW COMPARISON:  Right knee radiograph dated 11/22/2020. FINDINGS: There is no acute fracture or dislocation. The bones are osteopenic. Severe arthritic changes with tricompartmental narrowing. No joint effusion. Mild diffuse subcutaneous edema. IMPRESSION: 1. No acute fracture or dislocation. 2. Severe arthritic changes. Electronically Signed   By: Anner Crete M.D.   On: 12/09/2020 19:27      Assessment/Plan Principal Problem:   Cellulitis of right leg Active Problems:   Chronic diastolic heart failure (HCC)   CKD (chronic kidney disease) stage 3, GFR 30-59 ml/min (HCC)   Type II diabetes mellitus with stage 3 chronic kidney disease (HCC)   B12 deficiency   Cellulitis    Cellulitis of the right leg for which patient has been placed on empiric antibiotics.  Since patient has significant swelling will check Dopplers to rule out DVT.  Dopplers elevated.  Closely monitor.  No signs of any compartment syndrome.  X-rays do not show any bony involvement and clinically does not look like patient has septic arthritis.  Orthopedic surgeon Dr. Alvan Dame was consulted by the ER physician and will be seeing patient in consult. Diabetes mellitus type 2 hemoglobin A1c 2 weeks ago was 7.  We will keep patient on sliding scale coverage. Hypertension continue ARB hydrochlorothiazide and beta-blockers. Chronic kidney disease stage III note that patient is on ARB and  hydrochlorothiazide. Hyperlipidemia on statins. History of diastolic CHF presently appears compensated.  Right leg is swollen likely from the cellulitis.  Since patient has worsening cellulitis of the right lower extremity and failing outpatient antibiotics will need IV antibiotics and inpatient status.   DVT prophylaxis: Lovenox. Code Status: DNR. Family Communication: Patient's son at the bedside.  Disposition Plan: Back to facility when stable. Consults called: Wound team. Admission status: Inpatient.   Rise Patience MD [facility] Pager [phone].  If 7PM-7AM, please contact night-coverage [email or web address] Password [email or web address]  12/10/2020, 3:12 AM

## 2020-12-11 DIAGNOSIS — E538 Deficiency of other specified B group vitamins: Secondary | ICD-10-CM

## 2020-12-11 LAB — CBC
HCT: 35.7 % — ABNORMAL LOW (ref 36.0–46.0)
Hemoglobin: 11.4 g/dL — ABNORMAL LOW (ref 12.0–15.0)
MCH: 31.1 pg (ref 26.0–34.0)
MCHC: 31.9 g/dL (ref 30.0–36.0)
MCV: 97.5 fL (ref 80.0–100.0)
Platelets: 195 10*3/uL (ref 150–400)
RBC: 3.66 MIL/uL — ABNORMAL LOW (ref 3.87–5.11)
RDW: 13.9 % (ref 11.5–15.5)
WBC: 8.9 10*3/uL (ref 4.0–10.5)
nRBC: 0 % (ref 0.0–0.2)

## 2020-12-11 LAB — BASIC METABOLIC PANEL
Anion gap: 8 (ref 5–15)
BUN: 36 mg/dL — ABNORMAL HIGH (ref 8–23)
CO2: 21 mmol/L — ABNORMAL LOW (ref 22–32)
Calcium: 8.3 mg/dL — ABNORMAL LOW (ref 8.9–10.3)
Chloride: 109 mmol/L (ref 98–111)
Creatinine, Ser: 1.17 mg/dL — ABNORMAL HIGH (ref 0.44–1.00)
GFR, Estimated: 44 mL/min — ABNORMAL LOW (ref >60)
Glucose, Bld: 111 mg/dL — ABNORMAL HIGH (ref 70–99)
Potassium: 3.9 mmol/L (ref 3.5–5.1)
Sodium: 138 mmol/L (ref 135–145)

## 2020-12-11 LAB — GLUCOSE, CAPILLARY
Glucose-Capillary: 107 mg/dL — ABNORMAL HIGH (ref 70–99)
Glucose-Capillary: 192 mg/dL — ABNORMAL HIGH (ref 70–99)
Glucose-Capillary: 77 mg/dL (ref 70–99)
Glucose-Capillary: 146 mg/dL — ABNORMAL HIGH (ref 70–99)

## 2020-12-11 LAB — MAGNESIUM: Magnesium: 1.8 mg/dL (ref 1.7–2.4)

## 2020-12-11 NOTE — Progress Notes (Addendum)
PROGRESS NOTE  Kelsey Salinas ELF:[id number] DOB: 05/14/1926 DOA: 12/09/2020 PCP: Virgie Dad, MD   LOS: 1 day   Brief narrative: Kelsey Salinas is a 87 y.o. female with past medical history of chronic diastolic heart failure, CKD, type 2 diabetes, vitamin B12 deficiency presented to hospital after experiencing redness and swelling over the right leg.  Of note patient did have a fall and landed on the knee on 11/22/2020.  Patient did have negative x-rays and was sent home at that time.  She had a follow-up appointment and was placed on doxycycline for some cellulitis but since she started having some purulent discharge from the knee and spreading erythema down the leg she was then considered for admission to the hospital.   Assessment/Plan:  Principal Problem:   Cellulitis of right leg Active Problems:   Chronic diastolic heart failure (HCC)   CKD (chronic kidney disease) stage 3, GFR 30-59 ml/min (HCC)   Type II diabetes mellitus with stage 3 chronic kidney disease (HCC)   B12 deficiency   Cellulitis  Right knee and leg cellulitis.  Started after having trauma to the knee.  Cellulitis likely unrelated to diabetes.  Purulent discharge noted on the superficial skin on exam on initial presentation.  Orthopedics has seen the patient at this time.  No concern for septic arthritis as per orthopedics.  Patient received IV vancomycin and cefepime.  Blood cultures negative in less than 24 hours.  Afebrile today.  No leukocytosis.   Diabetes mellitus type 2.  Hemoglobin A1c of 7.0, 2 weeks back.  We will continue sliding scale insulin while in the hospital.  Essential hypertension.   Continue Cozaar, hydrochlorothiazide, metoprolol.  Closely monitor blood pressure.   Mild hypokalemia.  Replenished.  Chronic kidney disease stage IIIa.  Continue ARB and hydrochlorothiazide.   Hyperlipidemia.  Continue statins  History of diastolic congestive heart failure.  Currently compensated.   DVT  prophylaxis: enoxaparin (LOVENOX) injection 40 mg Start: 12/10/20 1000   Code Status: DNR  Family Communication: Spoke with the patient's son on the phone and updated him about the clinical condition of the patient.  Disposition.  Patient from [facility].  Plan for disposition home tomorrow if continues to improve.  Status is: Inpatient  Remains inpatient appropriate because: Need for IV antibiotics,  Consultants: Orthopedics  Procedures: None  Anti-infectives:  Vancomycin and cefepime  Anti-infectives (From admission, onward)    Start     Dose/Rate Route Frequency Ordered Stop   12/11/20 0800  vancomycin (VANCOREADY) IVPB 1250 mg/250 mL        1,250 mg 166.7 mL/hr over 90 Minutes Intravenous Every 36 hours 12/10/20 0328     12/10/20 0400  vancomycin (VANCOREADY) IVPB 1000 mg/200 mL  Status:  Discontinued        1,000 mg 200 mL/hr over 60 Minutes Intravenous  Once 12/10/20 0312 12/10/20 0325   12/10/20 0400  ceFEPIme (MAXIPIME) 2 g in sodium chloride 0.9 % 100 mL IVPB        2 g 200 mL/hr over 30 Minutes Intravenous Every 12 hours 12/10/20 0312     12/09/20 1845  vancomycin (VANCOCIN) IVPB 1000 mg/200 mL premix        1,000 mg 200 mL/hr over 60 Minutes Intravenous  Once 12/09/20 1836 12/09/20 2152       Subjective: Today, patient was seen and examined at bedside.  Complains of mild discomfort but feels better.  Objective: Vitals:   12/10/20 1945 12/11/20 0517  BP: (!) 141/82 135/70  Pulse: (!) 103 93  Resp: 17 17  Temp: 98.5 F (36.9 C) 98.3 F (36.8 C)  SpO2: 96% 100%    Intake/Output Summary (Last 24 hours) at 12/11/2020 1213 Last data filed at 12/11/2020 0517 Gross per 24 hour  Intake 2430.08 ml  Output --  Net 2430.08 ml   Filed Weights   12/09/20 1517  Weight: 77.1 kg   Body mass index is 25.85 kg/m.   Physical Exam: GENERAL: Patient is alert awake and oriented. Not in obvious distress. HENT: No scleral pallor or  icterus. Pupils equally reactive to light. Oral mucosa is moist NECK: is supple, no gross swelling noted. CHEST: Clear to auscultation. No crackles or wheezes.  Diminished breath sounds bilaterally. CVS: S1 and S2 heard, no murmur. Regular rate and rhythm.  ABDOMEN: Soft, non-tender, bowel sounds are present. EXTREMITIES: Right lower extremity with erythema edema better than yesterday.  Dressing over the right knee area. CNS: Cranial nerves are intact. No focal motor deficits. SKIN: warm and dry without rashes.  Right lower extremity erythema edema cellulitis.  Data Review: I have personally reviewed the following laboratory data and studies,  CBC: Recent Labs  Lab 12/09/20 1858 12/10/20 0348 12/11/20 0331  WBC 10.4 10.1 8.9  NEUTROABS 7.2 7.0  --   HGB 12.6 12.2 11.4*  HCT 39.6 38.3 35.7*  MCV 95.2 98.0 97.5  PLT 264 209 193   Basic Metabolic Panel: Recent Labs  Lab 12/09/20 2040 12/10/20 0348 12/11/20 0331  NA 139 137 138  K 3.7 3.3* 3.9  CL 102 105 109  CO2 24 24 21*  GLUCOSE 200* 122* 111*  BUN 40* 42* 36*  CREATININE 1.22* 1.22* 1.17*  CALCIUM 9.9 8.5* 8.3*  MG  --   --  1.8   Liver Function Tests: Recent Labs  Lab 12/09/20 2040  AST 13*  ALT 8  ALKPHOS 88  BILITOT 1.1  PROT 6.9  ALBUMIN 4.0   No results for input(s): LIPASE, AMYLASE in the last 168 hours. No results for input(s): AMMONIA in the last 168 hours. Cardiac Enzymes: Recent Labs  Lab 12/09/20 2040  CKTOTAL 35*   BNP (last 3 results) Recent Labs    12/09/20 2040  BNP 104.6*    ProBNP (last 3 results) No results for input(s): PROBNP in the last 8760 hours.  CBG: Recent Labs  Lab 12/10/20 1709 12/10/20 1953 12/10/20 1957 12/11/20 0715 12/11/20 1142  GLUCAP 115* 178* 170* 107* 192*   Recent Results (from the past 240 hour(s))  Resp Panel by RT-PCR (Flu A&B, Covid) Nasopharyngeal Swab     Status: None   Collection Time: 12/09/20  6:58 PM   Specimen: Nasopharyngeal Swab;  Nasopharyngeal(NP) swabs in vial transport medium  Result Value Ref Range Status   SARS Coronavirus 2 by RT PCR NEGATIVE NEGATIVE Final    Comment: (NOTE) SARS-CoV-2 target nucleic acids are NOT DETECTED.  The SARS-CoV-2 RNA is generally detectable in upper respiratory specimens during the acute phase of infection. The lowest concentration of SARS-CoV-2 viral copies this assay can detect is 138 copies/mL. A negative result does not preclude SARS-Cov-2 infection and should not be used as the sole basis for treatment or other patient management decisions. A negative result may occur with  improper specimen collection/handling, submission of specimen other than nasopharyngeal swab, presence of viral mutation(s) within the areas targeted by this assay, and inadequate number of viral copies(<138 copies/mL). A negative result must be combined with clinical  observations, patient history, and epidemiological information. The expected result is Negative.  Fact Sheet for Patients:  EntrepreneurPulse.com.au  Fact Sheet for Healthcare Providers:  IncredibleEmployment.be  This test is no t yet approved or cleared by the [place] FDA and  has been authorized for detection and/or diagnosis of SARS-CoV-2 by FDA under an Emergency Use Authorization (EUA). This EUA will remain  in effect (meaning this test can be used) for the duration of the COVID-19 declaration under Section 564(b)(1) of the Act, 21 U.S.C.section 360bbb-3(b)(1), unless the authorization is terminated  or revoked sooner.       Influenza A by PCR NEGATIVE NEGATIVE Final   Influenza B by PCR NEGATIVE NEGATIVE Final    Comment: (NOTE) The Xpert Xpress SARS-CoV-2/FLU/RSV plus assay is intended as an aid in the diagnosis of influenza from Nasopharyngeal swab specimens and should not be used as a sole basis for treatment. Nasal washings and aspirates are unacceptable for Xpert Xpress  SARS-CoV-2/FLU/RSV testing.  Fact Sheet for Patients: EntrepreneurPulse.com.au  Fact Sheet for Healthcare Providers: IncredibleEmployment.be  This test is not yet approved or cleared by the [place] FDA and has been authorized for detection and/or diagnosis of SARS-CoV-2 by FDA under an Emergency Use Authorization (EUA). This EUA will remain in effect (meaning this test can be used) for the duration of the COVID-19 declaration under Section 564(b)(1) of the Act, 21 U.S.C. section 360bbb-3(b)(1), unless the authorization is terminated or revoked.  Performed at [facility], [address], [place], [place] [address]   Culture, blood (Routine X 2) w Reflex to ID Panel     Status: None (Preliminary result)   Collection Time: 12/09/20  7:40 PM   Specimen: BLOOD  Result Value Ref Range Status   Specimen Description   Final    BLOOD BLOOD RIGHT FOREARM Performed at [facility], [address] [place], [place] [address]    Special Requests   Final    Blood Culture adequate volume BOTTLES DRAWN AEROBIC AND ANAEROBIC Performed at [facility], [address] [place], [place] [address]    Culture   Final    NO GROWTH 1 DAY Performed at [facility], [place] [address] [place], [place] [address]    Report Status PENDING  Incomplete  Culture, blood (Routine X 2) w Reflex to ID Panel     Status: None (Preliminary result)   Collection Time: 12/10/20  3:48 AM   Specimen: BLOOD  Result Value Ref Range Status   Specimen Description   Final    BLOOD LEFT WRIST Performed at [facility] [address] [place], [place] [address]    Special Requests   Final    BOTTLES DRAWN AEROBIC ONLY Blood Culture adequate volume Performed at [facility] [address], [place], [place] [address]    Culture   Final    NO GROWTH < 24 HOURS Performed at  [facility] [address], [place], [place] [address]    Report Status PENDING  Incomplete     Studies: DG Chest 2 View  Result Date: 12/09/2020 CLINICAL DATA:  Fall. EXAM: CHEST - 2 VIEW COMPARISON:  Chest x-ray 05/06/2016. FINDINGS: The heart size and mediastinal contours are within normal limits. Both lungs are clear. The visualized skeletal structures are unremarkable. IMPRESSION: No active cardiopulmonary disease. Electronically Signed   By: Ronney Asters M.D.   On: 12/09/2020 19:27   DG Knee Complete 4 Views Right  Result Date: 12/09/2020 CLINICAL DATA:  Right knee pain. EXAM: RIGHT KNEE - COMPLETE 4+ VIEW COMPARISON:  Right knee radiograph dated 11/22/2020. FINDINGS: There is no acute fracture or dislocation. The bones are osteopenic. Severe arthritic changes with tricompartmental narrowing. No joint effusion. Mild diffuse subcutaneous edema. IMPRESSION: 1. No acute fracture or dislocation. 2. Severe arthritic changes. Electronically Signed   By: Anner Crete M.D.   On: 12/09/2020 19:27   VAS [place] LOWER EXTREMITY VENOUS (DVT)  Result Date: 12/10/2020  Lower Venous DVT Study Patient Name:  Kelsey Salinas  Date of Exam:   12/10/2020 Medical Rec #: [id number]        Accession #:    [id number] Date of Birth: 08/15/1926        Patient Gender: F Patient Age:   46 years Exam Location:  [facility] Procedure:      VAS [place] LOWER EXTREMITY VENOUS (DVT) Referring Phys: Gean Birchwood --------------------------------------------------------------------------------  Indications: Cellulitis RLE (redness, warmth, swelling, pain).  Comparison Study: No previous exams Performing Technologist: Jody Hill RVT, RDMS  Examination Guidelines: A complete evaluation includes B-mode imaging, spectral Doppler, color Doppler, and power Doppler as needed of all accessible portions of each vessel. Bilateral testing is considered an integral part of a complete examination. Limited examinations for  reoccurring indications may be performed as noted. The reflux portion of the exam is performed with the patient in reverse Trendelenburg.  +---------+---------------+---------+-----------+----------+--------------+ RIGHT    CompressibilityPhasicitySpontaneityPropertiesThrombus Aging +---------+---------------+---------+-----------+----------+--------------+ CFV      Full           Yes      Yes                                 +---------+---------------+---------+-----------+----------+--------------+ SFJ      Full                                                        +---------+---------------+---------+-----------+----------+--------------+ FV Prox  Full           Yes      Yes                                 +---------+---------------+---------+-----------+----------+--------------+ FV Mid   Full           Yes      Yes                                 +---------+---------------+---------+-----------+----------+--------------+ FV DistalFull           Yes      Yes                                 +---------+---------------+---------+-----------+----------+--------------+ PFV      Full                                                        +---------+---------------+---------+-----------+----------+--------------+ POP  Full           Yes      Yes                                 +---------+---------------+---------+-----------+----------+--------------+ PTV      Full                                                        +---------+---------------+---------+-----------+----------+--------------+ PERO     Full                                                        +---------+---------------+---------+-----------+----------+--------------+   +---------+---------------+---------+-----------+----------+--------------+ LEFT     CompressibilityPhasicitySpontaneityPropertiesThrombus Aging  +---------+---------------+---------+-----------+----------+--------------+ CFV      Full           Yes      Yes                                 +---------+---------------+---------+-----------+----------+--------------+ SFJ      Full                                                        +---------+---------------+---------+-----------+----------+--------------+ FV Prox  Full           Yes      Yes                                 +---------+---------------+---------+-----------+----------+--------------+ FV Mid   Full           Yes      Yes                                 +---------+---------------+---------+-----------+----------+--------------+ FV DistalFull           Yes      Yes                                 +---------+---------------+---------+-----------+----------+--------------+ PFV      Full                                                        +---------+---------------+---------+-----------+----------+--------------+ POP      Full           Yes      Yes                                 +---------+---------------+---------+-----------+----------+--------------+ PTV      Full                                                        +---------+---------------+---------+-----------+----------+--------------+  Summary: BILATERAL: - No evidence of deep vein thrombosis seen in the lower extremities, bilaterally. - No evidence of superficial venous thrombosis in the lower extremities, bilaterally. -No evidence of popliteal cyst, bilaterally.   *See table(s) above for measurements and observations. Electronically signed by Jamelle Haring on 12/10/2020 at 4:00:56 PM.    Final      Flora Lipps, MD  [facility] 12/11/2020  If 7PM-7AM, please contact night-coverage

## 2020-12-11 NOTE — Plan of Care (Signed)
  Problem: Activity: Goal: Risk for activity intolerance will decrease Outcome: Progressing   Problem: Coping: Goal: Level of anxiety will decrease Outcome: Progressing   Problem: Elimination: Goal: Will not experience complications related to bowel motility Outcome: Progressing   Problem: Clinical Measurements: Goal: Respiratory complications will improve Outcome: Progressing

## 2020-12-11 NOTE — Consult Note (Signed)
ORTHOPAEDIC CONSULTATION  REQUESTING PHYSICIAN: Flora Lipps, MD  PCP:  Virgie Dad, MD  Chief Complaint: Right knee and leg pain/erythema  HPI: Kelsey Salinas is a 87 y.o. female who presented to the ED on 11/12 due to a fall on the right knee. She had negative x-rays and was discharged home. She had a follow up appointment and was placed on doxycycline without improvement, which led to return to [place] ED on 12/29. In the ED, she had purulent discharge from the right knee as well as erythema down the leg. She was admitted for cellulitis and started on empiric antibiotics with cefepime and vancomycin. X-rays show no fracture or dislocation. Dr. Alvan Dame was consulted for evaluation of the right knee.   Today, she is resting in bed on exam. She is in good spirits, but notes that she would like to go home today. She reports she had not had any issues with this knee prior to the injury. She locates her pain over the top of the knee. Her RN was present during my examination, and reports that her erythema is improving.   Past Medical History:  Diagnosis Date   Childhood asthma    Chronic diastolic heart failure [facility]) 12/25/2012   October 2013 echocardiogram 1. There is moderate concentric left ventricle hypertrophy. 2. Left ventricular ejection fraction estimated by 2D at 60-65 percent. 3. There were no regional wall motion abnormalities. 4. Mild mitral annular calcification. 5. Mild mitral valve regurgitation. 6. There is mild tricuspid regurgitation. 7. Mildly elevated estimated right ventricular systolic pressure. 8. Right ventricular systolic pressure estimated at 35-40 mm Hg. 9. Mild calcification of the aortic valve. 10. Trace aortic valve regurgitation. 11. Analysis of mitral valve inflow, pulmonary vein Doppler and tissue Doppler suggests grade I diastolic dysfunction without elevated left atrial pressure.    Colon polyp    Diverticular stricture ([place]) 2009   Diverticulosis  10/30/2007   DM (diabetes mellitus), type 2 ([place])    Edema 09/12/2013   Fall 05/18/2017   tripped over wheel of walker; superior and inferior pubic ramus fractures    Hypercholesterolemia    Hypertension    Hypertension, essential    Hypothyroidism    as a teenager   Indigestion    Insomnia    OA (osteoarthritis) of hip    right hip replacement, OA in bilateral knees   Osteopenia    Actonel stopped in 2012 after 5years therapy   Pneumonia 1939   "hospitalized"   Rapid resting heart rate 60/10/9321   Uncertain cause. Possible ectopic atrial tachycardia. Date entered 12/25/12    Right rotator cuff tear    Seasonal allergies    Past Surgical History:  Procedure Laterality Date   CATARACT EXTRACTION W/ INTRAOCULAR LENS  IMPLANT, BILATERAL Bilateral    COLONOSCOPY  2009   Dr. [facility]   DILATION AND CURETTAGE OF UTERUS     FRACTURE SURGERY     JOINT REPLACEMENT     ORIF WRIST FRACTURE Right    REDUCTION MAMMAPLASTY  1999   TOTAL HIP ARTHROPLASTY Right 03-04-08   Dr. Wynelle Link   Social History   Socioeconomic History   Marital status: Married    Spouse name: Not on file   Number of children: Not on file   Years of education: Not on file   Highest education level: Not on file  Occupational History   Occupation: retired travel agent  Tobacco Use   Smoking status: Former    Packs/day: 0.12  Years: 50.00    Pack years: 6.00    Types: Cigarettes   Smokeless tobacco: Never   Tobacco comments:    "stopped in the ate 1990s"  Vaping Use   Vaping Use: Never used  Substance and Sexual Activity   Alcohol use: Yes    Comment: rarely   Drug use: Never   Sexual activity: Not Currently  Other Topics Concern   Not on file  Social History Narrative   Lives at [facility] since 2010   Husband Ilona Sorrel married 1952 (husband in memory care unit at [employer])   Former smoker 1/2 PPD, for 30 years, stopped 01/10/1989   Alcohol 2 glasses of wine at night   Exercise: machines 3  times a week and daily walk   No POA/LW         Social Determinants of Radio broadcast assistant Strain: Not on file  Food Insecurity: Not on file  Transportation Needs: Not on file  Physical Activity: Not on file  Stress: Not on file  Social Connections: Not on file   Family History  Problem Relation Age of Onset   Heart disease Mother    Heart disease Father    Heart disease Brother    Allergies  Allergen Reactions   Amlodipine Besylate Other (See Comments)    Edema   Lisinopril Cough    Cough    Sulfamethoxazole-Trimethoprim Other (See Comments)    Unknown reaction    Prior to Admission medications   Medication Sig Start Date End Date Taking? Authorizing Provider  acetaminophen (TYLENOL) 500 MG tablet Take 500 mg by mouth every 6 (six) hours as needed for mild pain, headache or fever.   Yes [provider]  atorvastatin (LIPITOR) 40 MG tablet Take 1 tablet (40 mg total) by mouth daily. 01/09/20  Yes Reed, Tiffany L, DO  Ca Phosphate-Cholecalciferol (CALTRATE GUMMY BITES PO) Take 1 tablet by mouth daily.   Yes [provider]  donepezil (ARICEPT) 10 MG tablet TAKE ONE TABLET BY MOUTH AT BEDTIME Patient taking differently: Take 10 mg by mouth at bedtime. 07/10/20  Yes Virgie Dad, MD  hydrochlorothiazide (HYDRODIURIL) 25 MG tablet TAKE 1 TABLET ONCE A DAY WITH LOSARTAN UNTIL COMBO TABLET BACK AVAILABLE. Patient taking differently: Take 25 mg by mouth daily. 10/21/20  Yes Virgie Dad, MD  losartan (COZAAR) 100 MG tablet TAKE 1 TABLET DAILY WITH HCTZ TIL COMBINATION PILL AVAILABLE. Patient taking differently: Take 100 mg by mouth daily. 10/21/20  Yes Virgie Dad, MD  memantine (NAMENDA) 10 MG tablet TAKE 1 TABLET BY MOUTH TWICE DAILY. Patient taking differently: Take 10 mg by mouth 2 (two) times daily. 09/25/20  Yes Virgie Dad, MD  metoprolol succinate (TOPROL-XL) 100 MG 24 hr tablet TAKE 1&1/2 TABLETS TWICE DAILY. Patient taking  differently: Take 150 mg by mouth 2 (two) times daily. 07/21/20  Yes Belva Crome, MD  alendronate (FOSAMAX) 70 MG tablet TAKE 1 TAB ONCE A WEEK, AT LEAST 30 MIN BEFORE 1ST FOOD.DO NOT LIE DOWN FOR 30 MIN AFTER TAKING. Patient not taking: Reported on 12/10/2020 04/06/18   Hollace Kinnier L, DO  famotidine (PEPCID) 20 MG tablet Take 20 mg by mouth daily. Patient not taking: Reported on 12/10/2020    [provider]  fexofenadine (ALLEGRA) 180 MG tablet Take 1 tablet (180 mg total) by mouth daily. Patient not taking: Reported on 12/10/2020 06/02/20   Royal Hawthorn, NP  vitamin B-12 (CYANOCOBALAMIN) 1000 MCG tablet Take 1 tablet (  1,000 mcg total) by mouth daily. Patient not taking: Reported on 12/10/2020 12/16/17   Gayland Curry, DO   DG Chest 2 View  Result Date: 12/09/2020 CLINICAL DATA:  Fall. EXAM: CHEST - 2 VIEW COMPARISON:  Chest x-ray 05/06/2016. FINDINGS: The heart size and mediastinal contours are within normal limits. Both lungs are clear. The visualized skeletal structures are unremarkable. IMPRESSION: No active cardiopulmonary disease. Electronically Signed   By: Ronney Asters M.D.   On: 12/09/2020 19:27   DG Knee Complete 4 Views Right  Result Date: 12/09/2020 CLINICAL DATA:  Right knee pain. EXAM: RIGHT KNEE - COMPLETE 4+ VIEW COMPARISON:  Right knee radiograph dated 11/22/2020. FINDINGS: There is no acute fracture or dislocation. The bones are osteopenic. Severe arthritic changes with tricompartmental narrowing. No joint effusion. Mild diffuse subcutaneous edema. IMPRESSION: 1. No acute fracture or dislocation. 2. Severe arthritic changes. Electronically Signed   By: Anner Crete M.D.   On: 12/09/2020 19:27   VAS [place] LOWER EXTREMITY VENOUS (DVT)  Result Date: 12/10/2020  Lower Venous DVT Study Patient Name:  BERNARD DONAHOO  Date of Exam:   12/10/2020 Medical Rec #: [id number]        Accession #:    [id number] Date of Birth: September 24, 1926        Patient Gender: F Patient  Age:   6 years Exam Location:  [facility] Procedure:      VAS [place] LOWER EXTREMITY VENOUS (DVT) Referring Phys: Gean Birchwood --------------------------------------------------------------------------------  Indications: Cellulitis RLE (redness, warmth, swelling, pain).  Comparison Study: No previous exams Performing Technologist: Jody Hill RVT, RDMS  Examination Guidelines: A complete evaluation includes B-mode imaging, spectral Doppler, color Doppler, and power Doppler as needed of all accessible portions of each vessel. Bilateral testing is considered an integral part of a complete examination. Limited examinations for reoccurring indications may be performed as noted. The reflux portion of the exam is performed with the patient in reverse Trendelenburg.  +---------+---------------+---------+-----------+----------+--------------+ RIGHT    CompressibilityPhasicitySpontaneityPropertiesThrombus Aging +---------+---------------+---------+-----------+----------+--------------+ CFV      Full           Yes      Yes                                 +---------+---------------+---------+-----------+----------+--------------+ SFJ      Full                                                        +---------+---------------+---------+-----------+----------+--------------+ FV Prox  Full           Yes      Yes                                 +---------+---------------+---------+-----------+----------+--------------+ FV Mid   Full           Yes      Yes                                 +---------+---------------+---------+-----------+----------+--------------+ FV DistalFull           Yes      Yes                                 +---------+---------------+---------+-----------+----------+--------------+  PFV      Full                                                        +---------+---------------+---------+-----------+----------+--------------+ POP      Full            Yes      Yes                                 +---------+---------------+---------+-----------+----------+--------------+ PTV      Full                                                        +---------+---------------+---------+-----------+----------+--------------+ PERO     Full                                                        +---------+---------------+---------+-----------+----------+--------------+   +---------+---------------+---------+-----------+----------+--------------+ LEFT     CompressibilityPhasicitySpontaneityPropertiesThrombus Aging +---------+---------------+---------+-----------+----------+--------------+ CFV      Full           Yes      Yes                                 +---------+---------------+---------+-----------+----------+--------------+ SFJ      Full                                                        +---------+---------------+---------+-----------+----------+--------------+ FV Prox  Full           Yes      Yes                                 +---------+---------------+---------+-----------+----------+--------------+ FV Mid   Full           Yes      Yes                                 +---------+---------------+---------+-----------+----------+--------------+ FV DistalFull           Yes      Yes                                 +---------+---------------+---------+-----------+----------+--------------+ PFV      Full                                                        +---------+---------------+---------+-----------+----------+--------------+  POP      Full           Yes      Yes                                 +---------+---------------+---------+-----------+----------+--------------+ PTV      Full                                                        +---------+---------------+---------+-----------+----------+--------------+     Summary: BILATERAL: - No evidence of deep vein thrombosis seen in the  lower extremities, bilaterally. - No evidence of superficial venous thrombosis in the lower extremities, bilaterally. -No evidence of popliteal cyst, bilaterally.   *See table(s) above for measurements and observations. Electronically signed by Jamelle Haring on 12/10/2020 at 4:00:56 PM.    Final     Positive ROS: All other systems have been reviewed and were otherwise negative with the exception of those mentioned in the HPI and as above.  Physical Exam: General: Alert, no acute distress Cardiovascular: No pedal edema Respiratory: No cyanosis, no use of accessory musculature GI: No organomegaly, abdomen is soft and non-tender Skin: No lesions in the area of chief complaint Neurologic: Sensation intact distally Psychiatric: Patient is competent for consent with normal mood and affect Lymphatic: No axillary or cervical lymphadenopathy  MUSCULOSKELETAL:   Right Knee Exam: Mepilex dressing removed. She does have an open wound over the anterior knee with mild drainage. She has mild warmth and erythema from the knee to distal anterior shin. She is able to flex and extend the knee actively without discomfort. No tenderness about the joint lines. No effusion.   The anterior shin has mild erythema, which is noted to be improved per her RN from yesterday. Distal sensation and pulses intact.   Assessment: Right lower extremity cellulitis with open anterior knee wound  Plan:  I do not see any evidence of septic arthritis of her right knee. She moves the knee freely, and it is without effusion. This problem seems to be superficial, related to her delayed healing anterior knee wound and cellulitis of the lower leg. I agree with current management involving IV abx while here and transition to PO at discharge.   Patient is anxious to get home. Recommend follow up with her PCP after discharge for recheck of wound.   Discussed with Dr. Alvan Dame who agrees.   Irving Copas, PA-C Cell [phone]   12/11/2020 7:54 AM

## 2020-12-11 NOTE — Plan of Care (Signed)
  Problem: Activity: Goal: Risk for activity intolerance will decrease Outcome: Progressing   Problem: Nutrition: Goal: Adequate nutrition will be maintained Outcome: Progressing   

## 2020-12-12 LAB — GLUCOSE, CAPILLARY: Glucose-Capillary: 108 mg/dL — ABNORMAL HIGH (ref 70–99)

## 2020-12-12 MED ORDER — CEPHALEXIN 500 MG PO CAPS: 500.0000 mg | ORAL_CAPSULE | Freq: Three times a day (TID) | ORAL | 0 refills | Status: AC

## 2020-12-12 MED ORDER — DOXYCYCLINE HYCLATE 100 MG PO TABS: 100.0000 mg | ORAL_TABLET | Freq: Two times a day (BID) | ORAL | 0 refills | Status: AC

## 2020-12-12 NOTE — Progress Notes (Signed)
Pharmacy Antibiotic Note  Kelsey Salinas is a 87 y.o. female admitted on 12/09/2020 with purulent discharge from left knee area.  Pt had a fall on 11/12 and had been taking doxy with no real improvement.  Pharmacy has been consulted to dose vancomycin and cefepime for cellulitis  D3 full abx - vanc/cefepime Afebrile Labs WNL stable  Plan: Continue vanc 1250mg  q36h for now(AUC 517, Scr 1.22) Continue cefepime 2g IV q12h Follow renal function and clinical course  Height: 5\' 8"  (172.7 cm) Weight: 77.1 kg (170 lb) IBW/kg (Calculated) : 63.9  Temp (24hrs), Avg:97.6 F (36.4 C), Min:97.5 F (36.4 C), Max:97.6 F (36.4 C)  Recent Labs  Lab 12/09/20 1858 12/09/20 2040 12/10/20 0348 12/11/20 0331  WBC 10.4  --  10.1 8.9  CREATININE  --  1.22* 1.22* 1.17*  LATICACIDVEN  --  1.5  --   --      Estimated Creatinine Clearance: 33.5 mL/min (A) (by C-G formula based on SCr of 1.17 mg/dL (H)).    Allergies  Allergen Reactions   Amlodipine Besylate Other (See Comments)    Edema   Lisinopril Cough    Cough    Sulfamethoxazole-Trimethoprim Other (See Comments)    Unknown reaction     Antimicrobials this admission: 11/29 vanc >> 11/30 cefepime >>  Dose adjustments this admission:   Microbiology results: 11/29 BCx: ngtd   Thank you for allowing pharmacy to be a part of this patient's care.  Adrian Saran, PharmD, BCPS Secure Chat if ?s 12/12/2020 8:05 AM

## 2020-12-12 NOTE — Discharge Summary (Signed)
Physician Discharge Summary  Kelsey Salinas RUE:[id number] DOB: 02-04-1926 DOA: 12/09/2020  PCP: Virgie Dad, MD  Admit date: 12/09/2020 Discharge date: 12/12/2020  Admitted From: Home  Discharge disposition: Home   Recommendations for Outpatient Follow-Up:   Follow up with your primary care provider in one week.  Check CBC, BMP, magnesium in the next visit  Discharge Diagnosis:   Principal Problem:   Cellulitis of right leg Active Problems:   Chronic diastolic heart failure (HCC)   CKD (chronic kidney disease) stage 3, GFR 30-59 ml/min (HCC)   Type II diabetes mellitus with stage 3 chronic kidney disease (HCC)   B12 deficiency   Cellulitis  Discharge Condition: Improved.  Diet recommendation: Low sodium, heart healthy.  Carbohydrate-modified.    Wound care: Local knee care.  Code status: DNR  History of Present Illness:   Kelsey Salinas is a 87 y.o. female with past medical history of chronic diastolic heart failure, CKD, type 2 diabetes, vitamin B12 deficiency presented to hospital after experiencing redness and swelling over the right leg.  Of note patient did have a fall and landed on the knee on 11/22/2020.  Patient did have negative x-rays and was sent home at that time.  She had a follow-up appointment and was placed on doxycycline for some cellulitis but since she started having some purulent discharge from the knee and spreading erythema down the leg she was then considered for admission to the hospital.   Hospital Course:   Following conditions were addressed during hospitalization as listed below,  Right knee and leg cellulitis.  Started after having trauma to the knee.  Cellulitis likely unrelated to diabetes.  Purulent discharge noted on the superficial skin on exam on initial presentation.  Orthopedics has seen the patient at this time.  No concern for septic arthritis as per orthopedics.  Patient received IV vancomycin and cefepime during  hospitalization and the cellulitis has significantly improved at this time.  Blood cultures have been negative in 2 days.  At this time patient will be transitioned to oral antibiotics.  Patient will continue doxycycline and Keflex for the next 7 days on discharge.     Diabetes mellitus type 2.  Hemoglobin A1c of 7.0, 2 weeks back.  Not on any occasions at home.  Diet controlled.  Essential hypertension.   Continue Cozaar, hydrochlorothiazide, metoprolol on discharge.    Mild hypokalemia.  Replenished.  Potassium prior to discharge was 3.9.  Chronic kidney disease stage IIIa.  Continue ARB and hydrochlorothiazide on discharge.   Hyperlipidemia.  Continue Lipitor.  History of diastolic congestive heart failure.  Currently compensated.  Continue losartan.  Disposition.  At this time, patient is stable for disposition back to independent living facility.  Medical Consultants:   Orthopedics  Procedures:    None Subjective:   Today, patient was seen and examined at bedside.  Continues to feel better.  Denies any pain fever chills or rigor.  Discharge Exam:   Vitals:   12/11/20 2014 12/12/20 0636  BP: (!) 152/87 (!) 160/96  Pulse: (!) 103 (!) 108  Resp: 16 17  Temp: 97.6 F (36.4 C) (!) 97.5 F (36.4 C)  SpO2: 97% 95%   Vitals:   12/10/20 1945 12/11/20 0517 12/11/20 2014 12/12/20 0636  BP: (!) 141/82 135/70 (!) 152/87 (!) 160/96  Pulse: (!) 103 93 (!) 103 (!) 108  Resp: 17 17 16 17   Temp: 98.5 F (36.9 C) 98.3 F (36.8 C) 97.6 F (36.4 C) (!) 97.5 F (  36.4 C)  TempSrc:      SpO2: 96% 100% 97% 95%  Weight:      Height:        General: Alert awake, not in obvious distress HENT: pupils equally reacting to light,  No scleral pallor or icterus noted. Oral mucosa is moist.  Chest:  Clear breath sounds.  Diminished breath sounds bilaterally. No crackles or wheezes.  CVS: S1 &S2 heard. No murmur.  Regular rate and rhythm. Abdomen: Soft, nontender, nondistended.  Bowel  sounds are heard.   Extremities: No cyanosis, clubbing or edema.  Peripheral pulses are palpable.  Right lower extremity with minimal erythema edema. Psych: Alert, awake and oriented, normal mood CNS:  No cranial nerve deficits.  Power equal in all extremities.   Skin: Warm and dry.  Right lower extremity with erythema edema improved  The results of significant diagnostics from this hospitalization (including imaging, microbiology, ancillary and laboratory) are listed below for reference.     Diagnostic Studies:   DG Chest 2 View  Result Date: 12/09/2020 CLINICAL DATA:  Fall. EXAM: CHEST - 2 VIEW COMPARISON:  Chest x-ray 05/06/2016. FINDINGS: The heart size and mediastinal contours are within normal limits. Both lungs are clear. The visualized skeletal structures are unremarkable. IMPRESSION: No active cardiopulmonary disease. Electronically Signed   By: Ronney Asters M.D.   On: 12/09/2020 19:27   DG Knee Complete 4 Views Right  Result Date: 12/09/2020 CLINICAL DATA:  Right knee pain. EXAM: RIGHT KNEE - COMPLETE 4+ VIEW COMPARISON:  Right knee radiograph dated 11/22/2020. FINDINGS: There is no acute fracture or dislocation. The bones are osteopenic. Severe arthritic changes with tricompartmental narrowing. No joint effusion. Mild diffuse subcutaneous edema. IMPRESSION: 1. No acute fracture or dislocation. 2. Severe arthritic changes. Electronically Signed   By: Anner Crete M.D.   On: 12/09/2020 19:27   VAS [place] LOWER EXTREMITY VENOUS (DVT)  Result Date: 12/10/2020  Lower Venous DVT Study Patient Name:  Kelsey Salinas  Date of Exam:   12/10/2020 Medical Rec #: [id number]        Accession #:    [id number] Date of Birth: 1926-10-05        Patient Gender: F Patient Age:   40 years Exam [facility]  [facility] Procedure:      VAS [place] LOWER EXTREMITY VENOUS (DVT) Referring Phys: Gean Birchwood --------------------------------------------------------------------------------  Indications:  Cellulitis RLE (redness, warmth, swelling, pain).  Comparison Study: No previous exams Performing Technologist: Jody Hill RVT, RDMS  Examination Guidelines: A complete evaluation includes B-mode imaging, spectral Doppler, color Doppler, and power Doppler as needed of all accessible portions of each vessel. Bilateral testing is considered an integral part of a complete examination. Limited examinations for reoccurring indications may be performed as noted. The reflux portion of the exam is performed with the patient in reverse Trendelenburg.  +---------+---------------+---------+-----------+----------+--------------+ RIGHT    CompressibilityPhasicitySpontaneityPropertiesThrombus Aging +---------+---------------+---------+-----------+----------+--------------+ CFV      Full           Yes      Yes                                 +---------+---------------+---------+-----------+----------+--------------+ SFJ      Full                                                        +---------+---------------+---------+-----------+----------+--------------+  FV Prox  Full           Yes      Yes                                 +---------+---------------+---------+-----------+----------+--------------+ FV Mid   Full           Yes      Yes                                 +---------+---------------+---------+-----------+----------+--------------+ FV DistalFull           Yes      Yes                                 +---------+---------------+---------+-----------+----------+--------------+ PFV      Full                                                        +---------+---------------+---------+-----------+----------+--------------+ POP      Full           Yes      Yes                                 +---------+---------------+---------+-----------+----------+--------------+ PTV      Full                                                         +---------+---------------+---------+-----------+----------+--------------+ PERO     Full                                                        +---------+---------------+---------+-----------+----------+--------------+   +---------+---------------+---------+-----------+----------+--------------+ LEFT     CompressibilityPhasicitySpontaneityPropertiesThrombus Aging +---------+---------------+---------+-----------+----------+--------------+ CFV      Full           Yes      Yes                                 +---------+---------------+---------+-----------+----------+--------------+ SFJ      Full                                                        +---------+---------------+---------+-----------+----------+--------------+ FV Prox  Full           Yes      Yes                                 +---------+---------------+---------+-----------+----------+--------------+ FV Mid   Full  Yes      Yes                                 +---------+---------------+---------+-----------+----------+--------------+ FV DistalFull           Yes      Yes                                 +---------+---------------+---------+-----------+----------+--------------+ PFV      Full                                                        +---------+---------------+---------+-----------+----------+--------------+ POP      Full           Yes      Yes                                 +---------+---------------+---------+-----------+----------+--------------+ PTV      Full                                                        +---------+---------------+---------+-----------+----------+--------------+     Summary: BILATERAL: - No evidence of deep vein thrombosis seen in the lower extremities, bilaterally. - No evidence of superficial venous thrombosis in the lower extremities, bilaterally. -No evidence of popliteal cyst, bilaterally.   *See table(s) above for measurements  and observations. Electronically signed by Jamelle Haring on 12/10/2020 at 4:00:56 PM.    Final      Labs:   Basic Metabolic Panel: Recent Labs  Lab 12/09/20 2040 12/10/20 0348 12/11/20 0331  NA 139 137 138  K 3.7 3.3* 3.9  CL 102 105 109  CO2 24 24 21*  GLUCOSE 200* 122* 111*  BUN 40* 42* 36*  CREATININE 1.22* 1.22* 1.17*  CALCIUM 9.9 8.5* 8.3*  MG  --   --  1.8   GFR Estimated Creatinine Clearance: 33.5 mL/min (A) (by C-G formula based on SCr of 1.17 mg/dL (H)). Liver Function Tests: Recent Labs  Lab 12/09/20 2040  AST 13*  ALT 8  ALKPHOS 88  BILITOT 1.1  PROT 6.9  ALBUMIN 4.0   No results for input(s): LIPASE, AMYLASE in the last 168 hours. No results for input(s): AMMONIA in the last 168 hours. Coagulation profile No results for input(s): INR, PROTIME in the last 168 hours.  CBC: Recent Labs  Lab 12/09/20 1858 12/10/20 0348 12/11/20 0331  WBC 10.4 10.1 8.9  NEUTROABS 7.2 7.0  --   HGB 12.6 12.2 11.4*  HCT 39.6 38.3 35.7*  MCV 95.2 98.0 97.5  PLT 264 209 195   Cardiac Enzymes: Recent Labs  Lab 12/09/20 2040  CKTOTAL 35*   BNP: Invalid input(s): POCBNP CBG: Recent Labs  Lab 12/11/20 0715 12/11/20 1142 12/11/20 1629 12/11/20 2015 12/12/20 0758  GLUCAP 107* 192* 77 146* 108*   D-Dimer No results for input(s): DDIMER in the last 72 hours. Hgb A1c No results for input(s): HGBA1C in the last 72 hours. Lipid Profile  No results for input(s): CHOL, HDL, LDLCALC, TRIG, CHOLHDL, LDLDIRECT in the last 72 hours. Thyroid function studies No results for input(s): TSH, T4TOTAL, T3FREE, THYROIDAB in the last 72 hours.  Invalid input(s): FREET3 Anemia work up No results for input(s): VITAMINB12, FOLATE, FERRITIN, TIBC, IRON, RETICCTPCT in the last 72 hours. Microbiology Recent Results (from the past 240 hour(s))  Resp Panel by RT-PCR (Flu A&B, Covid) Nasopharyngeal Swab     Status: None   Collection Time: 12/09/20  6:58 PM   Specimen:  Nasopharyngeal Swab; Nasopharyngeal(NP) swabs in vial transport medium  Result Value Ref Range Status   SARS Coronavirus 2 by RT PCR NEGATIVE NEGATIVE Final    Comment: (NOTE) SARS-CoV-2 target nucleic acids are NOT DETECTED.  The SARS-CoV-2 RNA is generally detectable in upper respiratory specimens during the acute phase of infection. The lowest concentration of SARS-CoV-2 viral copies this assay can detect is 138 copies/mL. A negative result does not preclude SARS-Cov-2 infection and should not be used as the sole basis for treatment or other patient management decisions. A negative result may occur with  improper specimen collection/handling, submission of specimen other than nasopharyngeal swab, presence of viral mutation(s) within the areas targeted by this assay, and inadequate number of viral copies(<138 copies/mL). A negative result must be combined with clinical observations, patient history, and epidemiological information. The expected result is Negative.  Fact Sheet for Patients:  EntrepreneurPulse.com.au  Fact Sheet for Healthcare Providers:  IncredibleEmployment.be  This test is no t yet approved or cleared by the [place] FDA and  has been authorized for detection and/or diagnosis of SARS-CoV-2 by FDA under an Emergency Use Authorization (EUA). This EUA will remain  in effect (meaning this test can be used) for the duration of the COVID-19 declaration under Section 564(b)(1) of the Act, 21 U.S.C.section 360bbb-3(b)(1), unless the authorization is terminated  or revoked sooner.       Influenza A by PCR NEGATIVE NEGATIVE Final   Influenza B by PCR NEGATIVE NEGATIVE Final    Comment: (NOTE) The Xpert Xpress SARS-CoV-2/FLU/RSV plus assay is intended as an aid in the diagnosis of influenza from Nasopharyngeal swab specimens and should not be used as a sole basis for treatment. Nasal washings and aspirates are unacceptable for  Xpert Xpress SARS-CoV-2/FLU/RSV testing.  Fact Sheet for Patients: EntrepreneurPulse.com.au  Fact Sheet for Healthcare Providers: IncredibleEmployment.be  This test is not yet approved or cleared by the [place] FDA and has been authorized for detection and/or diagnosis of SARS-CoV-2 by FDA under an Emergency Use Authorization (EUA). This EUA will remain in effect (meaning this test can be used) for the duration of the COVID-19 declaration under Section 564(b)(1) of the Act, 21 U.S.C. section 360bbb-3(b)(1), unless the authorization is terminated or revoked.  Performed at [facility], [address], [place], [place] [address]   Culture, blood (Routine X 2) w Reflex to ID Panel     Status: None (Preliminary result)   Collection Time: 12/09/20  7:40 PM   Specimen: BLOOD  Result Value Ref Range Status   Specimen Description   Final    BLOOD BLOOD RIGHT FOREARM Performed at [facility], [address] [place],  [address]    Special Requests   Final    Blood Culture adequate volume BOTTLES DRAWN AEROBIC AND ANAEROBIC Performed at [facility], [address], [place],  [address]    Culture   Final    NO GROWTH 2 DAYS Performed at [facility], [place] [address] [place], [place] [address]    Report Status PENDING  Incomplete  Culture, blood (Routine X 2) w Reflex to ID Panel     Status: None (Preliminary result)   Collection Time: 12/10/20  3:48 AM   Specimen: BLOOD  Result Value Ref Range Status   Specimen Description   Final    BLOOD LEFT WRIST Performed at [facility] [address], [place], [place] [address]    Special Requests   Final    BOTTLES DRAWN AEROBIC ONLY Blood Culture adequate volume Performed at [facility] [address], [place], [place] [address]    Culture   Final    NO GROWTH 2  DAYS Performed at [facility] [address] [place],  [address]    Report Status PENDING  Incomplete     Discharge Instructions:   Discharge Instructions     Call MD for:  persistant nausea and vomiting   Complete by: As directed    Call MD for:  redness, tenderness, or signs of infection (pain, swelling, redness, odor or green/yellow discharge around incision site)   Complete by: As directed    Call MD for:  severe uncontrolled pain   Complete by: As directed    Call MD for:  temperature >100.4   Complete by: As directed    Diet Carb Modified   Complete by: As directed    Discharge instructions   Complete by: As directed    Complete the course of antibiotic.  Follow-up with your primary care provider at the facility in 1 week.  Seek medical attention for worsening symptoms.   Discharge wound care:   Complete by: As directed    Local dressing as needed.   Increase activity slowly   Complete by: As directed       Allergies as of 12/12/2020       Reactions   Amlodipine Besylate Other (See Comments)   Edema   Lisinopril Cough   Cough   Sulfamethoxazole-trimethoprim Other (See Comments)   Unknown reaction        Medication List     TAKE these medications    acetaminophen 500 MG tablet Commonly known as: TYLENOL Take 500 mg by mouth every 6 (six) hours as needed for mild pain, headache or fever.   alendronate 70 MG tablet Commonly known as: FOSAMAX TAKE 1 TAB ONCE A WEEK, AT LEAST 30 MIN BEFORE 1ST FOOD.DO NOT LIE DOWN FOR 30 MIN AFTER TAKING.   atorvastatin 40 MG tablet Commonly known as: LIPITOR Take 1 tablet (40 mg total) by mouth daily.   CALTRATE GUMMY BITES PO Take 1 tablet by mouth daily.   cephALEXin 500 MG capsule Commonly known as: KEFLEX Take 1 capsule (500 mg total) by mouth 3 (three) times daily for 7 days.   donepezil 10 MG tablet Commonly known as: ARICEPT TAKE ONE TABLET BY MOUTH AT BEDTIME   doxycycline 100 MG  tablet Commonly known as: VIBRA-TABS Take 1 tablet (100 mg total) by mouth 2 (two) times daily for 7 days.   famotidine 20 MG tablet Commonly known as: PEPCID Take 20 mg by mouth daily.   fexofenadine 180 MG tablet Commonly known as: ALLEGRA Take 1 tablet (180 mg total) by mouth daily.   hydrochlorothiazide 25 MG tablet Commonly known as: HYDRODIURIL TAKE 1 TABLET ONCE A DAY WITH LOSARTAN UNTIL COMBO TABLET BACK AVAILABLE. What changed: See the new instructions.  losartan 100 MG tablet Commonly known as: COZAAR TAKE 1 TABLET DAILY WITH HCTZ TIL COMBINATION PILL AVAILABLE. What changed: See the new instructions.   memantine 10 MG tablet Commonly known as: NAMENDA TAKE 1 TABLET BY MOUTH TWICE DAILY.   metoprolol succinate 100 MG 24 hr tablet Commonly known as: TOPROL-XL TAKE 1&1/2 TABLETS TWICE DAILY. What changed: See the new instructions.   vitamin B-12 1000 MCG tablet Commonly known as: CYANOCOBALAMIN Take 1 tablet (1,000 mcg total) by mouth daily.               Discharge Care Instructions  (From admission, onward)           Start     Ordered   12/12/20 0000  Discharge wound care:       Comments: Local dressing as needed.   12/12/20 9774              Follow-up Information     Virgie Dad, MD Follow up in 1 week(s).   Specialty: Internal Medicine Why: wound checkup, cellultitis followup Contact information: [place] [id number] [phone]         Belva Crome, MD .   Specialty: Cardiology Contact information: [address] [place] [address] [phone]                 Time coordinating discharge: 39 minutes  Signed:  Fareeha Evon  [facility] 12/12/2020, 8:27 AM

## 2020-12-15 ENCOUNTER — Telehealth: Payer: Self-pay | Admitting: *Deleted

## 2020-12-15 LAB — CULTURE, BLOOD (ROUTINE X 2)
Culture: NO GROWTH
Culture: NO GROWTH
Special Requests: ADEQUATE
Special Requests: ADEQUATE

## 2020-12-15 NOTE — Telephone Encounter (Signed)
Transition Care Management Follow-up Telephone Call Date of discharge and from where: 12/12/2020 [place] How have you been since you were released from the hospital? [facility] Any questions or concerns? No  Items Reviewed: Did the pt receive and understand the discharge instructions provided? Yes  Medications obtained and verified? Yes  Other? No  Any new allergies since your discharge? No  Dietary orders reviewed? Yes Do you have support at home? Yes   Home Care and Equipment/Supplies: Were home health services ordered? yes If so, what is the name of the agency? Patient has been receiving through [facility] the agency set up a time to come to the patient's home? not applicable Were any new equipment or medical supplies ordered?  No What is the name of the medical supply agency? [facility] Were you able to get the supplies/equipment? not applicable Do you have any questions related to the use of the equipment or supplies? No  Functional Questionnaire: (I = Independent and D = Dependent) ADLs: I with assistance  Bathing/Dressing- I with assistance  Meal Prep- D  Eating- I  Maintaining continence- I  Transferring/Ambulation- I with assistance, has Angela Burke and Wheelchair  Managing Meds- I  Follow up appointments reviewed:  PCP Hospital f/u appt confirmed? Yes  Scheduled to see Wert on 12/22/20 @ 2:30. [facility] f/u appt confirmed? No   Are transportation arrangements needed? No  If their condition worsens, is the pt aware to call PCP or go to the Emergency Dept.? Yes Was the patient provided with contact information for the PCP's office or ED? Yes Was to pt encouraged to call back with questions or concerns? Yes

## 2020-12-22 ENCOUNTER — Ambulatory Visit: Payer: Medicare Other | Admitting: Adult Health

## 2020-12-22 ENCOUNTER — Encounter: Payer: Self-pay | Admitting: Adult Health

## 2020-12-22 ENCOUNTER — Other Ambulatory Visit: Payer: Self-pay

## 2020-12-22 VITALS — BP 152/112 | HR 92 | Temp 96.4°F

## 2020-12-22 DIAGNOSIS — L03115 Cellulitis of right lower limb: Secondary | ICD-10-CM | POA: Diagnosis not present

## 2020-12-22 DIAGNOSIS — N1831 Chronic kidney disease, stage 3a: Secondary | ICD-10-CM | POA: Diagnosis not present

## 2020-12-22 DIAGNOSIS — E1122 Type 2 diabetes mellitus with diabetic chronic kidney disease: Secondary | ICD-10-CM | POA: Diagnosis not present

## 2020-12-22 DIAGNOSIS — I10 Essential (primary) hypertension: Secondary | ICD-10-CM | POA: Diagnosis not present

## 2020-12-22 DIAGNOSIS — S8001XD Contusion of right knee, subsequent encounter: Secondary | ICD-10-CM

## 2020-12-22 DIAGNOSIS — N1832 Chronic kidney disease, stage 3b: Secondary | ICD-10-CM | POA: Diagnosis not present

## 2020-12-22 MED ORDER — CEFADROXIL 500 MG PO CAPS: 500.0000 mg | ORAL_CAPSULE | Freq: Two times a day (BID) | ORAL | 0 refills | Status: DC

## 2020-12-22 NOTE — Progress Notes (Signed)
Location:  [facility]  [id number]  Provider:  Cindi Carbon, [place] [phone]  Code Status:  Goals of Care:  Advanced Directives 12/10/2020  Does Patient Have a Medical Advance Directive? Yes  Type of Advance Directive [place]  Does patient want to make changes to medical advance directive? No - Patient declined  Copy of [place] in Chart? No - copy requested  Would patient like information on creating a medical advance directive? No - Patient declined  Pre-existing out of facility DNR order (yellow form or pink MOST form) -     Chief Complaint  Patient presents with   Transitions Of Care    Patient returns to the clinic after being hospitalized 11/29-12/2    HPI: Patient is a 87 y.o. female seen today for hospital follow up She was initially seen in the clinic on 11/21 after a fall which led to an ER visit and a diagnosis of a knee contusion. For the visit she had some swelling and bruising but the swelling had improved and there was less pain. PT was ordered for gait and strength. Later in the week the swelling worsened and some erythema began. Doxycycline was started but she did not improve. She was admitted to the hospital 12/09/20-12/12/20 and diagnosed with cellulitis of the RLE. She received Vancomycin and Cefepime and then later was discharged on doxycycline and keflex. Doppler was negative. Blood cultures were negative. Had a small abrasion on the right knee that required dressings. She has diabetes that is well controlled for her age and she was recently taken off metformin due to renal function.  For the visit there is expressed concern that there is still redness to the right knee, drainage, and that there is a new pustule. Her family is requesting ID referral. She is not having a fever or systemic symptoms. Eating and drinking well. No pain.  Past Medical History:  Diagnosis Date   Childhood asthma     Chronic diastolic heart failure [facility]) 12/25/2012   October 2013 echocardiogram 1. There is moderate concentric left ventricle hypertrophy. 2. Left ventricular ejection fraction estimated by 2D at 60-65 percent. 3. There were no regional wall motion abnormalities. 4. Mild mitral annular calcification. 5. Mild mitral valve regurgitation. 6. There is mild tricuspid regurgitation. 7. Mildly elevated estimated right ventricular systolic pressure. 8. Right ventricular systolic pressure estimated at 35-40 mm Hg. 9. Mild calcification of the aortic valve. 10. Trace aortic valve regurgitation. 11. Analysis of mitral valve inflow, pulmonary vein Doppler and tissue Doppler suggests grade I diastolic dysfunction without elevated left atrial pressure.    Colon polyp    Diverticular stricture ([place]) 2009   Diverticulosis 10/30/2007   DM (diabetes mellitus), type 2 ([place])    Edema 09/12/2013   Fall 05/18/2017   tripped over wheel of walker; superior and inferior pubic ramus fractures    Hypercholesterolemia    Hypertension    Hypertension, essential    Hypothyroidism    as a teenager   Indigestion    Insomnia    OA (osteoarthritis) of hip    right hip replacement, OA in bilateral knees   Osteopenia    Actonel stopped in 2012 after 5years therapy   Pneumonia 1939   "hospitalized"   Rapid resting heart rate 30/09/2328   Uncertain cause. Possible ectopic atrial tachycardia. Date entered 12/25/12    Right rotator cuff tear    Seasonal allergies     Past Surgical History:  Procedure Laterality Date   CATARACT EXTRACTION W/ INTRAOCULAR LENS  IMPLANT, BILATERAL Bilateral    COLONOSCOPY  2009   Dr. [employer] [employer]   DILATION AND CURETTAGE OF UTERUS     FRACTURE SURGERY     JOINT REPLACEMENT     ORIF WRIST FRACTURE Right    REDUCTION MAMMAPLASTY  1999   TOTAL HIP ARTHROPLASTY Right 03-04-08   Dr. Wynelle Link    Allergies  Allergen Reactions   Amlodipine Besylate Other (See Comments)    Edema   Lisinopril  Cough    Cough    Sulfamethoxazole-Trimethoprim Other (See Comments)    Unknown reaction     Outpatient Encounter Medications as of 12/22/2020  Medication Sig   acetaminophen (TYLENOL) 500 MG tablet Take 500 mg by mouth every 6 (six) hours as needed for mild pain, headache or fever.   alendronate (FOSAMAX) 70 MG tablet TAKE 1 TAB ONCE A WEEK, AT LEAST 30 MIN BEFORE 1ST FOOD.DO NOT LIE DOWN FOR 30 MIN AFTER TAKING.   atorvastatin (LIPITOR) 40 MG tablet Take 1 tablet (40 mg total) by mouth daily.   Ca Phosphate-Cholecalciferol (CALTRATE GUMMY BITES PO) Take 1 tablet by mouth daily.   donepezil (ARICEPT) 10 MG tablet TAKE ONE TABLET BY MOUTH AT BEDTIME (Patient taking differently: Take 10 mg by mouth at bedtime.)   famotidine (PEPCID) 20 MG tablet Take 20 mg by mouth daily.   fexofenadine (ALLEGRA) 180 MG tablet Take 1 tablet (180 mg total) by mouth daily.   hydrochlorothiazide (HYDRODIURIL) 25 MG tablet TAKE 1 TABLET ONCE A DAY WITH LOSARTAN UNTIL COMBO TABLET BACK AVAILABLE. (Patient taking differently: Take 25 mg by mouth daily.)   losartan (COZAAR) 100 MG tablet TAKE 1 TABLET DAILY WITH HCTZ TIL COMBINATION PILL AVAILABLE. (Patient taking differently: Take 100 mg by mouth daily.)   memantine (NAMENDA) 10 MG tablet TAKE 1 TABLET BY MOUTH TWICE DAILY. (Patient taking differently: Take 10 mg by mouth 2 (two) times daily.)   metoprolol succinate (TOPROL-XL) 100 MG 24 hr tablet TAKE 1&1/2 TABLETS TWICE DAILY. (Patient taking differently: Take 150 mg by mouth 2 (two) times daily.)   vitamin B-12 (CYANOCOBALAMIN) 1000 MCG tablet Take 1 tablet (1,000 mcg total) by mouth daily.   [DISCONTINUED] cefadroxil (DURICEF) 500 MG capsule Take 500 mg by mouth 2 (two) times daily.   cefadroxil (DURICEF) 500 MG capsule Take 1 capsule (500 mg total) by mouth 2 (two) times daily.   No facility-administered encounter medications on file as of 12/22/2020.    Review of Systems:  Review of Systems   Constitutional:  Positive for activity change. Negative for appetite change, chills, diaphoresis, fatigue, fever and unexpected weight change.  HENT:  Negative for congestion.   Respiratory:  Positive for cough (chronic). Negative for shortness of breath and wheezing.   Cardiovascular:  Positive for leg swelling. Negative for chest pain and palpitations.  Gastrointestinal:  Negative for abdominal distention, abdominal pain, constipation and diarrhea.  Genitourinary:  Negative for difficulty urinating and dysuria.  Musculoskeletal:  Positive for gait problem and joint swelling. Negative for arthralgias, back pain and myalgias.  Skin:  Positive for color change and wound.  Neurological:  Negative for dizziness, tremors, seizures, syncope, facial asymmetry, speech difficulty, weakness, light-headedness, numbness and headaches.  Psychiatric/Behavioral:  Negative for agitation, behavioral problems and confusion.        Memory loss   Health Maintenance  Topic Date Due   Zoster Vaccines- Shingrix (1 of 2) Never done   Pneumonia Vaccine 65+  Years old (2 - PPSV23 if available, else PCV20) 06/04/2015   OPHTHALMOLOGY EXAM  07/11/2020   COVID-19 Vaccine (4 - Booster for Moderna series) 12/19/2020   FOOT EXAM  01/08/2021   HEMOGLOBIN A1C  05/25/2021   TETANUS/TDAP  07/21/2027   INFLUENZA VACCINE  Completed   DEXA SCAN  Completed   HPV VACCINES  Aged Out    Physical Exam: Vitals:   12/22/20 1439 12/22/20 1440  BP: (!) 138/92 (!) 152/112  Pulse: 92   Temp: (!) 96.4 F (35.8 C)   SpO2: 99%    There is no height or weight on file to calculate BMI. Physical Exam Vitals and nursing note reviewed.  Constitutional:      General: She is not in acute distress.    Appearance: She is not diaphoretic.  HENT:     Head: Normocephalic and atraumatic.  Neck:     Vascular: No JVD.  Cardiovascular:     Rate and Rhythm: Normal rate and regular rhythm.     Heart sounds: No murmur heard. Pulmonary:      Effort: Pulmonary effort is normal. No respiratory distress.     Breath sounds: Normal breath sounds. No wheezing.  Musculoskeletal:     Right lower leg: Edema (+1) present.     Left lower leg: No edema.  Skin:    General: Skin is warm and dry.     Comments: Right knee with small abrasion 100% pink tissue. Surrounding warmth and erythema present. New pustule above the abrasion. Healed eschar tissue proximal to this area. Purulent drainage on dressing.   Neurological:     Mental Status: She is alert and oriented to person, place, and time.  Psychiatric:        Mood and Affect: Mood normal.    Labs reviewed: Basic Metabolic Panel: Recent Labs    07/17/20 0000 11/25/20 0000 12/09/20 2040 12/10/20 0348 12/11/20 0331  NA 143   < > 139 137 138  K 3.8   < > 3.7 3.3* 3.9  CL 104   < > 102 105 109  CO2 26*   < > 24 24 21*  GLUCOSE  --   --  200* 122* 111*  BUN 35*   < > 40* 42* 36*  CREATININE 1.4*   < > 1.22* 1.22* 1.17*  CALCIUM 9.6   < > 9.9 8.5* 8.3*  MG  --   --   --   --  1.8  TSH 0.98  --   --   --   --    < > = values in this interval not displayed.   Liver Function Tests: Recent Labs    07/17/20 0000 11/25/20 0000 11/26/20 0000 12/09/20 2040  AST 18  --  13 13*  ALT 14  --  10 8  ALKPHOS 96  --  75 88  BILITOT  --   --   --  1.1  PROT  --   --   --  6.9  ALBUMIN 4.2 3.5  --  4.0   No results for input(s): LIPASE, AMYLASE in the last 8760 hours. No results for input(s): AMMONIA in the last 8760 hours. CBC: Recent Labs    12/09/20 1858 12/10/20 0348 12/11/20 0331  WBC 10.4 10.1 8.9  NEUTROABS 7.2 7.0  --   HGB 12.6 12.2 11.4*  HCT 39.6 38.3 35.7*  MCV 95.2 98.0 97.5  PLT 264 209 195   Lipid Panel: Recent Labs  01/03/20 0000 07/17/20 0000  CHOL 230* 164  HDL 68 64  LDLCALC 131 72  TRIG 156 139   Lab Results  Component Value Date   HGBA1C 7.0 11/25/2020    Procedures since last visit: DG Chest 2 View  Result Date: 12/09/2020 CLINICAL  DATA:  Fall. EXAM: CHEST - 2 VIEW COMPARISON:  Chest x-ray 05/06/2016. FINDINGS: The heart size and mediastinal contours are within normal limits. Both lungs are clear. The visualized skeletal structures are unremarkable. IMPRESSION: No active cardiopulmonary disease. Electronically Signed   By: Ronney Asters M.D.   On: 12/09/2020 19:27   DG Knee Complete 4 Views Right  Result Date: 12/09/2020 CLINICAL DATA:  Right knee pain. EXAM: RIGHT KNEE - COMPLETE 4+ VIEW COMPARISON:  Right knee radiograph dated 11/22/2020. FINDINGS: There is no acute fracture or dislocation. The bones are osteopenic. Severe arthritic changes with tricompartmental narrowing. No joint effusion. Mild diffuse subcutaneous edema. IMPRESSION: 1. No acute fracture or dislocation. 2. Severe arthritic changes. Electronically Signed   By: Anner Crete M.D.   On: 12/09/2020 19:27   VAS [place] LOWER EXTREMITY VENOUS (DVT)  Result Date: 12/10/2020  Lower Venous DVT Study Patient Name:  KASHAE CARSTENS  Date of Exam:   12/10/2020 Medical Rec #: [id number]        Accession #:    [id number] Date of Birth: 03-Mar-1926        Patient Gender: F Patient Age:   76 years Exam Location:  [facility] Procedure:      VAS [place] LOWER EXTREMITY VENOUS (DVT) Referring Phys: Gean Birchwood --------------------------------------------------------------------------------  Indications: Cellulitis RLE (redness, warmth, swelling, pain).  Comparison Study: No previous exams Performing Technologist: Jody Hill RVT, RDMS  Examination Guidelines: A complete evaluation includes B-mode imaging, spectral Doppler, color Doppler, and power Doppler as needed of all accessible portions of each vessel. Bilateral testing is considered an integral part of a complete examination. Limited examinations for reoccurring indications may be performed as noted. The reflux portion of the exam is performed with the patient in reverse Trendelenburg.   +---------+---------------+---------+-----------+----------+--------------+ RIGHT    CompressibilityPhasicitySpontaneityPropertiesThrombus Aging +---------+---------------+---------+-----------+----------+--------------+ CFV      Full           Yes      Yes                                 +---------+---------------+---------+-----------+----------+--------------+ SFJ      Full                                                        +---------+---------------+---------+-----------+----------+--------------+ FV Prox  Full           Yes      Yes                                 +---------+---------------+---------+-----------+----------+--------------+ FV Mid   Full           Yes      Yes                                 +---------+---------------+---------+-----------+----------+--------------+ FV DistalFull  Yes      Yes                                 +---------+---------------+---------+-----------+----------+--------------+ PFV      Full                                                        +---------+---------------+---------+-----------+----------+--------------+ POP      Full           Yes      Yes                                 +---------+---------------+---------+-----------+----------+--------------+ PTV      Full                                                        +---------+---------------+---------+-----------+----------+--------------+ PERO     Full                                                        +---------+---------------+---------+-----------+----------+--------------+   +---------+---------------+---------+-----------+----------+--------------+ LEFT     CompressibilityPhasicitySpontaneityPropertiesThrombus Aging +---------+---------------+---------+-----------+----------+--------------+ CFV      Full           Yes      Yes                                  +---------+---------------+---------+-----------+----------+--------------+ SFJ      Full                                                        +---------+---------------+---------+-----------+----------+--------------+ FV Prox  Full           Yes      Yes                                 +---------+---------------+---------+-----------+----------+--------------+ FV Mid   Full           Yes      Yes                                 +---------+---------------+---------+-----------+----------+--------------+ FV DistalFull           Yes      Yes                                 +---------+---------------+---------+-----------+----------+--------------+ PFV      Full                                                        +---------+---------------+---------+-----------+----------+--------------+  POP      Full           Yes      Yes                                 +---------+---------------+---------+-----------+----------+--------------+ PTV      Full                                                        +---------+---------------+---------+-----------+----------+--------------+     Summary: BILATERAL: - No evidence of deep vein thrombosis seen in the lower extremities, bilaterally. - No evidence of superficial venous thrombosis in the lower extremities, bilaterally. -No evidence of popliteal cyst, bilaterally.   *See table(s) above for measurements and observations. Electronically signed by Jamelle Haring on 12/10/2020 at 4:00:56 PM.    Final     Assessment/Plan  1. Cellulitis of right leg Mild worsening. No systemic symptoms.  Begin Duricef 500 mg bid x 10 days or until seen by ID for gram pos coverage. Has allergy to bactrim. Already finished doxy and keflex.  Discussed with Dr. Lyndel Safe.  - Ambulatory referral to Infectious Disease  2. Type 2 diabetes mellitus with stage 3b chronic kidney disease, without long-term current use of insulin (HCC) CBGs in the  hospital range 107-192. Off metformin due to renal function. A1C at goal for her age.  Lab Results  Component Value Date   HGBA1C 7.0 11/25/2020  Continue to monitor   3. Stage 3a chronic kidney disease ([place]) Has worsened, will check at f/u visit.  Continue to periodically monitor BMP and avoid nephrotoxic agents  4. Essential hypertension Elevated on initial check but improved at recheck. Continue toprol, taking hctz and losartan   5. Knee contusion Improving   6. Gait instability  Re order PT eval after hospitalization to progress mobility   Labs/tests ordered:  * No order type specified * Next appt:  12/30/2020

## 2020-12-23 NOTE — Telephone Encounter (Signed)
Replied to soon in another message in her account.

## 2020-12-24 ENCOUNTER — Encounter: Payer: Self-pay | Admitting: Infectious Disease

## 2020-12-24 ENCOUNTER — Ambulatory Visit (INDEPENDENT_AMBULATORY_CARE_PROVIDER_SITE_OTHER): Payer: Medicare Other | Admitting: Infectious Disease

## 2020-12-24 ENCOUNTER — Other Ambulatory Visit: Payer: Self-pay

## 2020-12-24 VITALS — BP 159/102 | HR 98 | Temp 97.4°F

## 2020-12-24 DIAGNOSIS — I10 Essential (primary) hypertension: Secondary | ICD-10-CM

## 2020-12-24 DIAGNOSIS — N183 Chronic kidney disease, stage 3 unspecified: Secondary | ICD-10-CM

## 2020-12-24 DIAGNOSIS — L02415 Cutaneous abscess of right lower limb: Secondary | ICD-10-CM | POA: Insufficient documentation

## 2020-12-24 DIAGNOSIS — L03115 Cellulitis of right lower limb: Secondary | ICD-10-CM

## 2020-12-24 DIAGNOSIS — Z794 Long term (current) use of insulin: Secondary | ICD-10-CM | POA: Diagnosis not present

## 2020-12-24 DIAGNOSIS — E1122 Type 2 diabetes mellitus with diabetic chronic kidney disease: Secondary | ICD-10-CM | POA: Diagnosis not present

## 2020-12-24 DIAGNOSIS — I129 Hypertensive chronic kidney disease with stage 1 through stage 4 chronic kidney disease, or unspecified chronic kidney disease: Secondary | ICD-10-CM | POA: Diagnosis not present

## 2020-12-24 HISTORY — DX: Cutaneous abscess of right lower limb: L02.415

## 2020-12-24 MED ORDER — CEFADROXIL 500 MG PO CAPS
1000.0000 mg | ORAL_CAPSULE | Freq: Two times a day (BID) | ORAL | 1 refills | Status: AC
Start: 2020-12-24 — End: 2021-01-07

## 2020-12-24 NOTE — Progress Notes (Signed)
Subjective:  Reason for Consult: None resolving "cellulitis and soft tissue infection Requesting Physician: Royal Hawthorn, NP   Patient ID: [name], female    DOB: 1926/11/15, 87 y.o.   MRN: [id number]  HPI  Jacilyn is a 87 year old with hx of chronic diastolic heart failure, diabetes mellitus, dementia on aricept and namena who sustained a fall on her right knee and was seen in the ER November 21.  Initially thought to have a contusion with swelling and bruising.  Swelling improved.  Patient was referred for physical therapy.  She then developed worsening erythema and she was started on doxycycline but failed to improve.  She ultimately is admitted to the hospitalist service on December 09, 2020 due to her failure to improve.  She was beginning beginning to have purulent discharge as well and the lesion superiorly in her knee.  She was mid to the hospital blood cultures were taken she was started on broad-spectrum antibiotics in the form of vancomycin and cefepime.  Referral to our clinic had already been made but we were unaware that she was in the hospital therefore she was not seen by [place].  Patient had plain films of the knee and was seen by orthopedic surgery who felt that she clinically did not have evidence of septic arthritis.  She was treated as mentioned with vancomycin and cefepime then patient then discharged on doxycycline and Keflex for 7 days postdischarge.  Despite continue to be on oral antibiotic she developed 2 new areas 1 of which began to drain purulent material.  The initial area that was the most purulence for which she came into the hospital for is resolving.   New lesions are more distal.  She continues to have quite swollen right lower extremity.    She does not have fevers chills malaise nausea vomiting rigors.  Saw her primary care physician yesterday and was restarted on cefadroxil after having been briefly off cephalosporin.  She had been rescheduled  to see me so that she could be seen more urgently as her appoint with Dr. Megan Salon was not till December 20.  Today in clinic she voices her dislike of being in the hospital and felt that this was a "waste of time".  She is accompanied by her daughter and an aide from [facility] where she is in independent living.     Past Medical History:  Diagnosis Date   Childhood asthma    Chronic diastolic heart failure [facility]) 12/25/2012   October 2013 echocardiogram 1. There is moderate concentric left ventricle hypertrophy. 2. Left ventricular ejection fraction estimated by 2D at 60-65 percent. 3. There were no regional wall motion abnormalities. 4. Mild mitral annular calcification. 5. Mild mitral valve regurgitation. 6. There is mild tricuspid regurgitation. 7. Mildly elevated estimated right ventricular systolic pressure. 8. Right ventricular systolic pressure estimated at 35-40 mm Hg. 9. Mild calcification of the aortic valve. 10. Trace aortic valve regurgitation. 11. Analysis of mitral valve inflow, pulmonary vein Doppler and tissue Doppler suggests grade I diastolic dysfunction without elevated left atrial pressure.    Colon polyp    Diverticular stricture ([place]) 2009   Diverticulosis 10/30/2007   DM (diabetes mellitus), type 2 ([place])    Edema 09/12/2013   Fall 05/18/2017   tripped over wheel of walker; superior and inferior pubic ramus fractures    Hypercholesterolemia    Hypertension    Hypertension, essential    Hypothyroidism    as a teenager   Indigestion  Insomnia    OA (osteoarthritis) of hip    right hip replacement, OA in bilateral knees   Osteopenia    Actonel stopped in 2012 after 5years therapy   Pneumonia 1939   "hospitalized"   Rapid resting heart rate 16/10/9602   Uncertain cause. Possible ectopic atrial tachycardia. Date entered 12/25/12    Right rotator cuff tear    Seasonal allergies     Past Surgical History:  Procedure Laterality Date   CATARACT EXTRACTION W/  INTRAOCULAR LENS  IMPLANT, BILATERAL Bilateral    COLONOSCOPY  2009   Dr. [facility]   DILATION AND CURETTAGE OF UTERUS     FRACTURE SURGERY     JOINT REPLACEMENT     ORIF WRIST FRACTURE Right    REDUCTION MAMMAPLASTY  1999   TOTAL HIP ARTHROPLASTY Right 03-04-08   Dr. Wynelle Link    Family History  Problem Relation Age of Onset   Heart disease Mother    Heart disease Father    Heart disease Brother       Social History   Socioeconomic History   Marital status: Married    Spouse name: Not on file   Number of children: Not on file   Years of education: Not on file   Highest education level: Not on file  Occupational History   Occupation: retired travel agent  Tobacco Use   Smoking status: Former    Packs/day: 0.12    Years: 50.00    Pack years: 6.00    Types: Cigarettes   Smokeless tobacco: Never   Tobacco comments:    "stopped in the ate 1990s"  Vaping Use   Vaping Use: Never used  Substance and Sexual Activity   Alcohol use: Yes    Comment: rarely   Drug use: Never   Sexual activity: Not Currently  Other Topics Concern   Not on file  Social History Narrative   Lives at [facility] since 2010   Husband Ilona Sorrel married 1952 (husband in memory care unit at [employer])   Former smoker 1/2 PPD, for 30 years, stopped 01/10/1989   Alcohol 2 glasses of wine at night   Exercise: machines 3 times a week and daily walk   No POA/LW         Social Determinants of Health   Financial Resource Strain: Not on file  Food Insecurity: Not on file  Transportation Needs: Not on file  Physical Activity: Not on file  Stress: Not on file  Social Connections: Not on file    Allergies  Allergen Reactions   Amlodipine Besylate Other (See Comments)    Edema   Lisinopril Cough    Cough    Sulfamethoxazole-Trimethoprim Other (See Comments)    Unknown reaction      Current Outpatient Medications:    acetaminophen (TYLENOL) 500 MG tablet, Take 500 mg by mouth every 6 (six)  hours as needed for mild pain, headache or fever., Disp: , Rfl:    alendronate (FOSAMAX) 70 MG tablet, TAKE 1 TAB ONCE A WEEK, AT LEAST 30 MIN BEFORE 1ST FOOD.DO NOT LIE DOWN FOR 30 MIN AFTER TAKING., Disp: 4 tablet, Rfl: 0   atorvastatin (LIPITOR) 40 MG tablet, Take 1 tablet (40 mg total) by mouth daily., Disp: 90 tablet, Rfl: 3   Ca Phosphate-Cholecalciferol (CALTRATE GUMMY BITES PO), Take 1 tablet by mouth daily., Disp: , Rfl:    cefadroxil (DURICEF) 500 MG capsule, Take 1 capsule (500 mg total) by mouth 2 (two) times daily., Disp: 20 capsule,  Rfl: 0   donepezil (ARICEPT) 10 MG tablet, TAKE ONE TABLET BY MOUTH AT BEDTIME (Patient taking differently: Take 10 mg by mouth at bedtime.), Disp: 30 tablet, Rfl: 3   famotidine (PEPCID) 20 MG tablet, Take 20 mg by mouth daily., Disp: , Rfl:    fexofenadine (ALLEGRA) 180 MG tablet, Take 1 tablet (180 mg total) by mouth daily., Disp: 30 tablet, Rfl: 2   hydrochlorothiazide (HYDRODIURIL) 25 MG tablet, TAKE 1 TABLET ONCE A DAY WITH LOSARTAN UNTIL COMBO TABLET BACK AVAILABLE. (Patient taking differently: Take 25 mg by mouth daily.), Disp: 90 tablet, Rfl: 2   losartan (COZAAR) 100 MG tablet, TAKE 1 TABLET DAILY WITH HCTZ TIL COMBINATION PILL AVAILABLE. (Patient taking differently: Take 100 mg by mouth daily.), Disp: 90 tablet, Rfl: 2   memantine (NAMENDA) 10 MG tablet, TAKE 1 TABLET BY MOUTH TWICE DAILY. (Patient taking differently: Take 10 mg by mouth 2 (two) times daily.), Disp: 60 tablet, Rfl: 11   metoprolol succinate (TOPROL-XL) 100 MG 24 hr tablet, TAKE 1&1/2 TABLETS TWICE DAILY. (Patient taking differently: Take 150 mg by mouth 2 (two) times daily.), Disp: 270 tablet, Rfl: 3   vitamin B-12 (CYANOCOBALAMIN) 1000 MCG tablet, Take 1 tablet (1,000 mcg total) by mouth daily., Disp: 90 tablet, Rfl: 1    Review of Systems  Unable to perform ROS: Dementia      Objective:   Physical Exam Constitutional:      General: She is not in acute distress.     Appearance: She is well-developed. She is not ill-appearing or diaphoretic.  HENT:     Head: Normocephalic and atraumatic.     Right Ear: Hearing and external ear normal.     Left Ear: Hearing and external ear normal.     Nose: No nasal deformity or rhinorrhea.  Eyes:     General: No scleral icterus.    Conjunctiva/sclera: Conjunctivae normal.     Right eye: Right conjunctiva is not injected.     Left eye: Left conjunctiva is not injected.     Pupils: Pupils are equal, round, and reactive to light.  Neck:     Vascular: No JVD.  Cardiovascular:     Rate and Rhythm: Normal rate and regular rhythm.     Heart sounds: Normal heart sounds, S1 normal and S2 normal. No murmur heard.   No friction rub.  Abdominal:     General: Bowel sounds are normal. There is no distension.     Palpations: Abdomen is soft.     Tenderness: There is no abdominal tenderness.  Musculoskeletal:        General: Swelling present. Normal range of motion.     Right shoulder: Normal.     Left shoulder: Normal.     Cervical back: Normal range of motion and neck supple.     Right hip: Normal.     Left hip: Normal.     Right knee: Normal.     Left knee: Normal.  Lymphadenopathy:     Head:     Right side of head: No submandibular, preauricular or posterior auricular adenopathy.     Left side of head: No submandibular, preauricular or posterior auricular adenopathy.     Cervical: No cervical adenopathy.     Right cervical: No superficial or deep cervical adenopathy.    Left cervical: No superficial or deep cervical adenopathy.  Skin:    General: Skin is warm and dry.     Coloration: Skin is not pale.  Findings: No abrasion, bruising, ecchymosis, erythema, lesion or rash.     Nails: There is no clubbing.  Neurological:     General: No focal deficit present.     Mental Status: She is alert.     Sensory: No sensory deficit.     Coordination: Coordination normal.     Gait: Gait normal.  Psychiatric:         Attention and Perception: She is attentive.        Speech: Speech is delayed.        Behavior: Behavior normal. Behavior is cooperative.        Thought Content: Thought content normal.        Cognition and Memory: Memory is impaired. She exhibits impaired recent memory and impaired remote memory.    Right leg 12/24/2020:          Assessment & Plan:   Non resolving cellulitis--which more likely involves at minimum soft tissue abscesses  I checking a sed rate CRP basic metabolic panel CBC with differential today.  I have ordered an MRI with and without contrast of the tibia-fibula which is going to be performed tomorrow.  Her daughter who is a Software engineer by training cannot come tomorrow because she has to have chemotherapy but her aide will accompany her for MRI. Her  daughter will be back next week when she has her appointment.  I have increased her cefadroxil to 2 500 mg twice daily--this is higher than dose recommended by her GFR but I would like to push the dose here.  I am not going with doxycyline along with this though it is certainly possible MRSA could be the culprit and she really did not seem to respond well to doxycycline at the beginning.  Again all of this may be an issue of needing drainage of abscesses rather than having been on incorrect antibiotics.  Certainly if MRI shows abscesses will ask orthopedics if they can perform I&D if they will not do it we will asked general surgery.  I do not think that our clinic is the appropriate place for I&D in these areas.  I have scheduled her with Dr. Candiss Norse next week so she can be followed closely by u  Dementia she is continuiing on Namenda and Aricept. Daughter buys the majority of the history and questions about care.  I will give my card to both her and the daughter.  I spent 81 minutes with the patient including than 50% of the time in face to face counseling of the patient and her surrogate the daughter regarding the  nature of soft tissue infections such as cellulitis and deeper infections including abscesses, septic joints osteomyelitis, personally reviewing pain films of the knee performed while she was in the hospital her Dopplers blood cultures CBC CMP along with review of medical records in preparation for the visit and during the visit and in coordination of her care.

## 2020-12-25 ENCOUNTER — Ambulatory Visit (HOSPITAL_COMMUNITY)
Admission: RE | Admit: 2020-12-25 | Discharge: 2020-12-25 | Disposition: A | Discharge status: Home / Self Care | Payer: Medicare Other | Source: Ambulatory Visit | Attending: Infectious Disease | Admitting: Infectious Disease

## 2020-12-25 DIAGNOSIS — L02415 Cutaneous abscess of right lower limb: Secondary | ICD-10-CM | POA: Diagnosis not present

## 2020-12-25 DIAGNOSIS — L03115 Cellulitis of right lower limb: Secondary | ICD-10-CM | POA: Diagnosis not present

## 2020-12-25 LAB — CBC WITH DIFFERENTIAL/PLATELET
Absolute Monocytes: 546 cells/uL (ref 200–950)
Basophils Absolute: 50 cells/uL (ref 0–200)
Basophils Relative: 0.6 %
Eosinophils Absolute: 168 cells/uL (ref 15–500)
Eosinophils Relative: 2 %
HCT: 39 % (ref 35.0–45.0)
Hemoglobin: 12.7 g/dL (ref 11.7–15.5)
Lymphs Abs: 1638 cells/uL (ref 850–3900)
MCH: 30.9 pg (ref 27.0–33.0)
MCHC: 32.6 g/dL (ref 32.0–36.0)
MCV: 94.9 fL (ref 80.0–100.0)
MPV: 11 fL (ref 7.5–12.5)
Monocytes Relative: 6.5 %
Neutro Abs: 5998 cells/uL (ref 1500–7800)
Neutrophils Relative %: 71.4 %
Platelets: 223 10*3/uL (ref 140–400)
RBC: 4.11 10*6/uL (ref 3.80–5.10)
RDW: 12.9 % (ref 11.0–15.0)
Total Lymphocyte: 19.5 %
WBC: 8.4 10*3/uL (ref 3.8–10.8)

## 2020-12-25 LAB — BASIC METABOLIC PANEL WITH GFR
BUN/Creatinine Ratio: 21 (calc) (ref 6–22)
BUN: 23 mg/dL (ref 7–25)
CO2: 29 mmol/L (ref 20–32)
Calcium: 9.5 mg/dL (ref 8.6–10.4)
Chloride: 106 mmol/L (ref 98–110)
Creat: 1.1 mg/dL — ABNORMAL HIGH (ref 0.60–0.95)
Glucose, Bld: 97 mg/dL (ref 65–99)
Potassium: 4.5 mmol/L (ref 3.5–5.3)
Sodium: 143 mmol/L (ref 135–146)
eGFR: 47 mL/min/{1.73_m2} — ABNORMAL LOW (ref >=60)

## 2020-12-25 LAB — SEDIMENTATION RATE: Sed Rate: 14 mm/h (ref 0–30)

## 2020-12-25 LAB — C-REACTIVE PROTEIN: CRP: 1.3 mg/L (ref <8.0)

## 2020-12-25 MED ORDER — GADOBUTROL 1 MMOL/ML IV SOLN
7.0000 mL | Freq: Once | INTRAVENOUS | Status: AC | PRN
  Administered 2020-12-25: 7 mL via INTRAVENOUS

## 2020-12-26 ENCOUNTER — Other Ambulatory Visit: Payer: Self-pay | Admitting: Family

## 2020-12-26 ENCOUNTER — Encounter: Payer: Medicare Other | Admitting: Family

## 2020-12-26 ENCOUNTER — Telehealth: Payer: Self-pay

## 2020-12-26 DIAGNOSIS — R413 Other amnesia: Secondary | ICD-10-CM

## 2020-12-26 NOTE — Telephone Encounter (Signed)
-----   Message from Truman Hayward, MD sent at 12/25/2020  2:55 PM EST ----- Regarding: FW: No drainable abscess on MRI perhaps what she has is too small to see on imaging. She is to continue current antibiotics and see Dr Candiss Norse next week ----- Message ----- From: Interface, Rad Results In Sent: 12/25/2020   1:51 PM EST To: Truman Hayward, MD

## 2020-12-26 NOTE — Telephone Encounter (Signed)
Patient aware of results and verbalized her understanding.   Kelsey Salinas Kelsey Salinas, CMA  

## 2020-12-30 ENCOUNTER — Ambulatory Visit (INDEPENDENT_AMBULATORY_CARE_PROVIDER_SITE_OTHER): Payer: Medicare Other | Admitting: Family

## 2020-12-30 ENCOUNTER — Encounter: Payer: Self-pay | Admitting: Family

## 2020-12-30 ENCOUNTER — Other Ambulatory Visit: Payer: Self-pay

## 2020-12-30 DIAGNOSIS — R2689 Other abnormalities of gait and mobility: Secondary | ICD-10-CM | POA: Diagnosis not present

## 2020-12-30 DIAGNOSIS — Z0000 Encounter for general adult medical examination without abnormal findings: Secondary | ICD-10-CM | POA: Diagnosis not present

## 2020-12-30 DIAGNOSIS — L03115 Cellulitis of right lower limb: Secondary | ICD-10-CM | POA: Diagnosis not present

## 2020-12-30 DIAGNOSIS — Z9181 History of falling: Secondary | ICD-10-CM | POA: Diagnosis not present

## 2020-12-30 DIAGNOSIS — F418 Other specified anxiety disorders: Secondary | ICD-10-CM | POA: Diagnosis not present

## 2020-12-30 DIAGNOSIS — R2681 Unsteadiness on feet: Secondary | ICD-10-CM | POA: Diagnosis not present

## 2020-12-30 NOTE — Patient Instructions (Signed)
Kelsey Salinas , Thank you for taking time to come for your Medicare Wellness Visit. I appreciate your ongoing commitment to your health goals. Please review the following plan we discussed and let me know if I can assist you in the future.   Screening recommendations/referrals: Colonoscopy : N/A  Mammogram : N/A  Bone Density : up to date  Recommended yearly ophthalmology/optometry visit for glaucoma screening and checkup Recommended yearly dental visit for hygiene and checkup  Vaccinations: Influenza vaccine: Up to date  Pneumococcal vaccine : Due  Tdap vaccine : Up to date  Shingles vaccine : Due     Advanced directives: Yes   Conditions/risks identified: Advance age female > 4 yrs,Hx of smoking,dyslipidemia   Next appointment: 1 year    Preventive Care 81 Years and Older, Female Preventive care refers to lifestyle choices and visits with your health care provider that can promote health and wellness. What does preventive care include? A yearly physical exam. This is also called an annual well check. Dental exams once or twice a year. Routine eye exams. Ask your health care provider how often you should have your eyes checked. Personal lifestyle choices, including: Daily care of your teeth and gums. Regular physical activity. Eating a healthy diet. Avoiding tobacco and drug use. Limiting alcohol use. Practicing safe sex. Taking low-dose aspirin every day. Taking vitamin and mineral supplements as recommended by your health care provider. What happens during an annual well check? The services and screenings done by your health care provider during your annual well check will depend on your age, overall health, lifestyle risk factors, and family history of disease. Counseling  Your health care provider may ask you questions about your: Alcohol use. Tobacco use. Drug use. Emotional well-being. Home and relationship well-being. Sexual activity. Eating habits. History  of falls. Memory and ability to understand (cognition). Work and work Statistician. Reproductive health. Screening  You may have the following tests or measurements: Height, weight, and BMI. Blood pressure. Lipid and cholesterol levels. These may be checked every 5 years, or more frequently if you are over 53 years old. Skin check. Lung cancer screening. You may have this screening every year starting at age 70 if you have a 30-pack-year history of smoking and currently smoke or have quit within the past 15 years. Fecal occult blood test (FOBT) of the stool. You may have this test every year starting at age 67. Flexible sigmoidoscopy or colonoscopy. You may have a sigmoidoscopy every 5 years or a colonoscopy every 10 years starting at age 17. Hepatitis C blood test. Hepatitis B blood test. Sexually transmitted disease (STD) testing. Diabetes screening. This is done by checking your blood sugar (glucose) after you have not eaten for a while (fasting). You may have this done every 1-3 years. Bone density scan. This is done to screen for osteoporosis. You may have this done starting at age 28. Mammogram. This may be done every 1-2 years. Talk to your health care provider about how often you should have regular mammograms. Talk with your health care provider about your test results, treatment options, and if necessary, the need for more tests. Vaccines  Your health care provider may recommend certain vaccines, such as: Influenza vaccine. This is recommended every year. Tetanus, diphtheria, and acellular pertussis (Tdap, Td) vaccine. You may need a Td booster every 10 years. Zoster vaccine. You may need this after age 51. Pneumococcal 13-valent conjugate (PCV13) vaccine. One dose is recommended after age 65. Pneumococcal polysaccharide (PPSV23) vaccine.  One dose is recommended after age 67. Talk to your health care provider about which screenings and vaccines you need and how often you need  them. This information is not intended to replace advice given to you by your health care provider. Make sure you discuss any questions you have with your health care provider. Document Released: 01/24/2015 Document Revised: 09/17/2015 Document Reviewed: 10/29/2014 Elsevier Interactive Patient Education  2017 [place] Prevention in the Home Falls can cause injuries. They can happen to people of all ages. There are many things you can do to make your home safe and to help prevent falls. What can I do on the outside of my home? Regularly fix the edges of walkways and driveways and fix any cracks. Remove anything that might make you trip as you walk through a door, such as a raised step or threshold. Trim any bushes or trees on the path to your home. Use bright outdoor lighting. Clear any walking paths of anything that might make someone trip, such as rocks or tools. Regularly check to see if handrails are loose or broken. Make sure that both sides of any steps have handrails. Any raised decks and porches should have guardrails on the edges. Have any leaves, snow, or ice cleared regularly. Use sand or salt on walking paths during winter. Clean up any spills in your garage right away. This includes oil or grease spills. What can I do in the bathroom? Use night lights. Install grab bars by the toilet and in the tub and shower. Do not use towel bars as grab bars. Use non-skid mats or decals in the tub or shower. If you need to sit down in the shower, use a plastic, non-slip stool. Keep the floor dry. Clean up any water that spills on the floor as soon as it happens. Remove soap buildup in the tub or shower regularly. Attach bath mats securely with double-sided non-slip rug tape. Do not have throw rugs and other things on the floor that can make you trip. What can I do in the bedroom? Use night lights. Make sure that you have a light by your bed that is easy to reach. Do not use  any sheets or blankets that are too big for your bed. They should not hang down onto the floor. Have a firm chair that has side arms. You can use this for support while you get dressed. Do not have throw rugs and other things on the floor that can make you trip. What can I do in the kitchen? Clean up any spills right away. Avoid walking on wet floors. Keep items that you use a lot in easy-to-reach places. If you need to reach something above you, use a strong step stool that has a grab bar. Keep electrical cords out of the way. Do not use floor polish or wax that makes floors slippery. If you must use wax, use non-skid floor wax. Do not have throw rugs and other things on the floor that can make you trip. What can I do with my stairs? Do not leave any items on the stairs. Make sure that there are handrails on both sides of the stairs and use them. Fix handrails that are broken or loose. Make sure that handrails are as long as the stairways. Check any carpeting to make sure that it is firmly attached to the stairs. Fix any carpet that is loose or worn. Avoid having throw rugs at the top or bottom of  the stairs. If you do have throw rugs, attach them to the floor with carpet tape. Make sure that you have a light switch at the top of the stairs and the bottom of the stairs. If you do not have them, ask someone to add them for you. What else can I do to help prevent falls? Wear shoes that: Do not have high heels. Have rubber bottoms. Are comfortable and fit you well. Are closed at the toe. Do not wear sandals. If you use a stepladder: Make sure that it is fully opened. Do not climb a closed stepladder. Make sure that both sides of the stepladder are locked into place. Ask someone to hold it for you, if possible. Clearly mark and make sure that you can see: Any grab bars or handrails. First and last steps. Where the edge of each step is. Use tools that help you move around (mobility aids)  if they are needed. These include: Canes. Walkers. Scooters. Crutches. Turn on the lights when you go into a dark area. Replace any light bulbs as soon as they burn out. Set up your furniture so you have a clear path. Avoid moving your furniture around. If any of your floors are uneven, fix them. If there are any pets around you, be aware of where they are. Review your medicines with your doctor. Some medicines can make you feel dizzy. This can increase your chance of falling. Ask your doctor what other things that you can do to help prevent falls. This information is not intended to replace advice given to you by your health care provider. Make sure you discuss any questions you have with your health care provider. Document Released: 10/24/2008 Document Revised: 06/05/2015 Document Reviewed: 02/01/2014 Elsevier Interactive Patient Education  2017 [employer].

## 2020-12-30 NOTE — Progress Notes (Addendum)
Subjective:   Kelsey Salinas is a 87 y.o. female who presents for Medicare Annual (Subsequent) preventive examination.  Review of Systems     Cardiac Risk Factors include: advanced age (>61men, >54 women);hypertension;smoking/ tobacco exposure;dyslipidemia     Objective:    There were no vitals filed for this visit. There is no height or weight on file to calculate BMI.  Advanced Directives 12/10/2020 12/09/2020 12/01/2020 11/22/2020 07/23/2020 01/09/2020 12/25/2019  Does Patient Have a Medical Advance Directive? Yes Yes No No Yes Yes Yes  Type of Advance Directive [place];Living will Living will;Healthcare Power of Attorney Living will;Healthcare Power of Attorney  Does patient want to make changes to medical advance directive? No - Patient declined - - - No - Patient declined No - Patient declined No - Patient declined  Copy of [place] in Chart? No - copy requested - - - Yes - validated most recent copy scanned in chart (See row information) Yes - validated most recent copy scanned in chart (See row information) Yes - validated most recent copy scanned in chart (See row information)  Would patient like information on creating a medical advance directive? No - Patient declined - No - Patient declined No - Patient declined - - -  Pre-existing out of facility DNR order (yellow form or pink MOST form) - - - - - - -    Current Medications (verified) Outpatient Encounter Medications as of 12/30/2020  Medication Sig   acetaminophen (TYLENOL) 500 MG tablet Take 500 mg by mouth every 6 (six) hours as needed for mild pain, headache or fever.   alendronate (FOSAMAX) 70 MG tablet TAKE 1 TAB ONCE A WEEK, AT LEAST 30 MIN BEFORE 1ST FOOD.DO NOT LIE DOWN FOR 30 MIN AFTER TAKING.   atorvastatin (LIPITOR) 40 MG tablet Take 1 tablet (40 mg total) by mouth daily.   Ca Phosphate-Cholecalciferol (CALTRATE GUMMY BITES PO) Take 1  tablet by mouth daily.   cefadroxil (DURICEF) 500 MG capsule Take 2 capsules (1,000 mg total) by mouth 2 (two) times daily for 14 days.   donepezil (ARICEPT) 10 MG tablet TAKE ONE TABLET BY MOUTH AT BEDTIME   famotidine (PEPCID) 20 MG tablet Take 20 mg by mouth daily.   fexofenadine (ALLEGRA) 180 MG tablet Take 1 tablet (180 mg total) by mouth daily.   hydrochlorothiazide (HYDRODIURIL) 25 MG tablet TAKE 1 TABLET ONCE A DAY WITH LOSARTAN UNTIL COMBO TABLET BACK AVAILABLE. (Patient taking differently: Take 25 mg by mouth daily.)   losartan (COZAAR) 100 MG tablet TAKE 1 TABLET DAILY WITH HCTZ TIL COMBINATION PILL AVAILABLE. (Patient taking differently: Take 100 mg by mouth daily.)   memantine (NAMENDA) 10 MG tablet TAKE 1 TABLET BY MOUTH TWICE DAILY. (Patient taking differently: Take 10 mg by mouth 2 (two) times daily.)   metoprolol succinate (TOPROL-XL) 100 MG 24 hr tablet TAKE 1&1/2 TABLETS TWICE DAILY. (Patient taking differently: Take 150 mg by mouth 2 (two) times daily.)   vitamin B-12 (CYANOCOBALAMIN) 1000 MCG tablet Take 1 tablet (1,000 mcg total) by mouth daily.   No facility-administered encounter medications on file as of 12/30/2020.    Allergies (verified) Amlodipine besylate, Lisinopril, and Sulfamethoxazole-trimethoprim   History: Past Medical History:  Diagnosis Date   Abscess of right leg 12/24/2020   Childhood asthma    Chronic diastolic heart failure ([place]) 12/25/2012   October 2013 echocardiogram 1. There is moderate concentric left ventricle hypertrophy. 2. Left ventricular  ejection fraction estimated by 2D at 60-65 percent. 3. There were no regional wall motion abnormalities. 4. Mild mitral annular calcification. 5. Mild mitral valve regurgitation. 6. There is mild tricuspid regurgitation. 7. Mildly elevated estimated right ventricular systolic pressure. 8. Right ventricular systolic pressure estimated at 35-40 mm Hg. 9. Mild calcification of the aortic valve. 10. Trace aortic  valve regurgitation. 11. Analysis of mitral valve inflow, pulmonary vein Doppler and tissue Doppler suggests grade I diastolic dysfunction without elevated left atrial pressure.    Colon polyp    Diverticular stricture ([place]) 2009   Diverticulosis 10/30/2007   DM (diabetes mellitus), type 2 ([place])    Edema 09/12/2013   Fall 05/18/2017   tripped over wheel of walker; superior and inferior pubic ramus fractures    Hypercholesterolemia    Hypertension    Hypertension, essential    Hypothyroidism    as a teenager   Indigestion    Insomnia    OA (osteoarthritis) of hip    right hip replacement, OA in bilateral knees   Osteopenia    Actonel stopped in 2012 after 5years therapy   Pneumonia 1939   "hospitalized"   Rapid resting heart rate 16/10/9602   Uncertain cause. Possible ectopic atrial tachycardia. Date entered 12/25/12    Right rotator cuff tear    Seasonal allergies    Past Surgical History:  Procedure Laterality Date   CATARACT EXTRACTION W/ INTRAOCULAR LENS  IMPLANT, BILATERAL Bilateral    COLONOSCOPY  2009   Dr. [facility]   DILATION AND CURETTAGE OF UTERUS     FRACTURE SURGERY     JOINT REPLACEMENT     ORIF WRIST FRACTURE Right    REDUCTION MAMMAPLASTY  1999   TOTAL HIP ARTHROPLASTY Right 03-04-08   Dr. Wynelle Link   Family History  Problem Relation Age of Onset   Heart disease Mother    Heart disease Father    Heart disease Brother    Social History   Socioeconomic History   Marital status: Married    Spouse name: Not on file   Number of children: Not on file   Years of education: Not on file   Highest education level: Not on file  Occupational History   Occupation: retired travel agent  Tobacco Use   Smoking status: Former    Packs/day: 0.12    Years: 50.00    Pack years: 6.00    Types: Cigarettes   Smokeless tobacco: Never   Tobacco comments:    "stopped in the ate 1990s"  Vaping Use   Vaping Use: Never used  Substance and Sexual Activity   Alcohol use:  Yes    Comment: rarely   Drug use: Never   Sexual activity: Not Currently  Other Topics Concern   Not on file  Social History Narrative   Lives at [facility] since 2010   Husband Ilona Sorrel married 1952 (husband in memory care unit at [facility])   Former smoker 1/2 PPD, for 30 years, stopped 01/10/1989   Alcohol 2 glasses of wine at night   Exercise: machines 3 times a week and daily walk   No POA/LW         Social Determinants of Health   Financial Resource Strain: Not on file  Food Insecurity: Not on file  Transportation Needs: Not on file  Physical Activity: Not on file  Stress: Not on file  Social Connections: Not on file    Tobacco Counseling Counseling given: Not Answered Tobacco comments: "stopped in the ate  1990s"   Clinical Intake:  Pre-visit preparation completed: No  Pain : No/denies pain     BMI - recorded: 27.55 Nutritional Status: BMI 25 -29 Overweight Nutritional Risks: None Diabetes: No  How often do you need to have someone help you when you read instructions, pamphlets, or other written materials from your doctor or pharmacy?: 3 - Sometimes What is the last grade level you completed in school?: College  Diabetic?No   Interpreter Needed?: No  Information entered by :: Babetta Paterson,FNP-c   Activities of Daily Living In your present state of health, do you have any difficulty performing the following activities: 12/30/2020 12/10/2020  Hearing? N Y  Vision? N N  Difficulty concentrating or making decisions? Y N  Comment [place] -  Walking or climbing stairs? Y Y  Comment uses wheelchair -  Dressing or bathing? Y N  Doing errands, shopping? [place] and eating ? N -  Using the Toilet? N -  In the past six months, have you accidently leaked urine? N -  Do you have problems with loss of bowel control? N -  Managing your Medications? Y -  Managing your Finances? Y -  Comment POA assist -   Housekeeping or managing your Housekeeping? Y -  Comment resides in facility -  Some recent data might be hidden    Patient Care Team: Virgie Dad, MD as PCP - General (Internal Medicine) Belva Crome, MD as PCP - Cardiology (Cardiology) Gaynelle Arabian, MD as Consulting Physician (Orthopedic Surgery) Belva Crome, MD as Consulting Physician (Cardiology) Monna Fam, MD as Consulting Physician (Ophthalmology) Elsie Saas, MD as Consulting Physician (Orthopedic Surgery)  Indicate any recent Medical Services you may have received from other than [facility] providers in the past year (date may be approximate).     Assessment:   This is a routine wellness examination for [place].  Hearing/Vision screen No results found.  Dietary issues and exercise activities discussed: Current Exercise Habits: The patient does not participate in regular exercise at present, Exercise limited by: Other - see comments (unsteady gait)   Goals Addressed             This Visit's Progress    Patient Stated       I want to be independent        Depression Screen PHQ 2/9 Scores 12/30/2020 12/24/2020 12/25/2019 05/09/2019 12/20/2018 12/13/2018 12/07/2017  PHQ - 2 Score 3 0 0 0 0 0 0  PHQ- 9 Score 5 - - - - - -    Fall Risk Fall Risk  12/30/2020 12/24/2020 12/22/2020 12/01/2020 07/23/2020  Falls in the past year? 1 - 0 0 0  Comment - - - - -  Number falls in past yr: 0 0 0 0 0  Injury with Fall? 0 1 0 0 -  Comment - scraped knee and it got infected - - -  Risk for fall due to : Impaired mobility - No Fall Risks No Fall Risks -  Follow up Falls evaluation completed - Falls evaluation completed Falls evaluation completed Falls evaluation completed    [place]:  Any stairs in or around the home? No  If so, are there any without handrails? No  Home free of loose throw rugs in walkways, pet beds, electrical cords, etc? Yes  Adequate lighting in your home  to reduce risk of falls? Yes   ASSISTIVE DEVICES UTILIZED  TO PREVENT FALLS:  Life alert? Yes  Use of a cane, walker or w/c? Yes  Grab bars in the bathroom? Yes  Shower chair or bench in shower? No  Elevated toilet seat or a handicapped toilet? Yes   TIMED UP AND GO:  Was the test performed? No .  Length of time to ambulate 10 feet: N/A sec.   Gait unsteady with use of assistive device, provider informed and education provided.   Cognitive Function: MMSE - Mini Mental State Exam 07/23/2020 05/09/2019 07/19/2017 04/07/2016 04/02/2015  Orientation to time 4 4 4 5 5   Orientation to Place 5 5 5 5 5   Registration 3 3 3 3 3   Attention/ Calculation 1 5 5 5 5   Recall 2 3 1 3 3   Language- name 2 objects 2 2 2 2 2   Language- repeat 1 1 1 1 1   Language- follow 3 step command 3 3 3 3 3   Language- read & follow direction 1 1 1 1 1   Write a sentence 1 1 1 1 1   Copy design 1 1 1 1 1   Total score 24 29 27 30 30      6CIT Screen 12/30/2020 12/25/2019 12/20/2018  What Year? 4 points 0 points 0 points  What month? 0 points 0 points 0 points  What time? 0 points 0 points 0 points  Count back from 20 0 points 0 points 0 points  Months in reverse 0 points 0 points 0 points  Repeat phrase 6 points 8 points 0 points  Total Score 10 8 0    Immunizations Immunization History  Administered Date(s) Administered   Influenza, High Dose Seasonal PF 10/20/2018, 11/09/2019   Influenza,inj,Quad PF,6+ Mos 11/04/2017   Influenza-Unspecified 10/25/2013, 10/31/2014, 11/06/2015, 11/01/2016, 10/31/2020   Moderna SARS-COV2 Booster Vaccination 10/24/2020   Moderna Sars-Covid-2 Vaccination 01/23/2019, 02/20/2019, 11/27/2019   Pneumococcal Conjugate-13 06/04/2014   Tdap 07/19/2017    TDAP status: Up to date  Flu Vaccine status: Up to date  Pneumococcal vaccine status: Due, Education has been provided regarding the importance of this vaccine. Advised may receive this vaccine at local pharmacy or Health Dept.  Aware to provide a copy of the vaccination record if obtained from local pharmacy or Health Dept. Verbalized acceptance and understanding.  Covid-19 vaccine status: Information provided on how to obtain vaccines.   Qualifies for Shingles Vaccine? Yes   Zostavax completed No   Shingrix Completed?: No.    Education has been provided regarding the importance of this vaccine. Patient has been advised to call insurance company to determine out of pocket expense if they have not yet received this vaccine. Advised may also receive vaccine at local pharmacy or Health Dept. Verbalized acceptance and understanding.  Screening Tests Health Maintenance  Topic Date Due   Zoster Vaccines- Shingrix (1 of 2) Never done   Pneumonia Vaccine 69+ Years old (2 - PPSV23 if available, else PCV20) 06/04/2015   OPHTHALMOLOGY EXAM  07/11/2020   COVID-19 Vaccine (4 - Booster for Moderna series) 12/19/2020   FOOT EXAM  01/08/2021   HEMOGLOBIN A1C  05/25/2021   TETANUS/TDAP  07/21/2027   INFLUENZA VACCINE  Completed   DEXA SCAN  Completed   HPV VACCINES  Aged Out    Health Maintenance  Health Maintenance Due  Topic Date Due   Zoster Vaccines- Shingrix (1 of 2) Never done   Pneumonia Vaccine 57+ Years old (2 - PPSV23 if available, else PCV20) 06/04/2015   OPHTHALMOLOGY EXAM  07/11/2020  COVID-19 Vaccine (4 - Booster for Moderna series) 12/19/2020    Colorectal cancer screening: No longer required.   Mammogram status: No longer required due to due to age .  Bone Density status: Completed 12/21/2012. Results reflect: Bone density results: OSTEOPOROSIS. Repeat every 2 years.  Lung Cancer Screening: (Low Dose CT Chest recommended if Age 8-80 years, 30 pack-year currently smoking OR have quit w/in 15years.) does not qualify.   Lung Cancer Screening Referral: No   Additional Screening:  Hepatitis C Screening: does not qualify; Completed No   Vision Screening: Recommended annual ophthalmology exams for  early detection of glaucoma and other disorders of the eye. Is the patient up to date with their annual eye exam?  No  Who is the provider or what is the name of the office in which the patient attends annual eye exams? Does not recall Ophthalmologist name  If pt is not established with a provider, would they like to be referred to a provider to establish care? No .   Dental Screening: Recommended annual dental exams for proper oral hygiene  Community Resource Referral / Chronic Care Management: CRR required this visit?  No   CCM required this visit?  No      Plan:     I have personally reviewed and noted the following in the patients chart:   Medical and social history Use of alcohol, tobacco or illicit drugs  Current medications and supplements including opioid prescriptions.  Functional ability and status Nutritional status Physical activity Advanced directives List of other physicians Hospitalizations, surgeries, and ER visits in previous 12 months Vitals Screenings to include cognitive, depression, and falls Referrals and appointments  In addition, I have reviewed and discussed with patient certain preventive protocols, quality metrics, and best practice recommendations. A written personalized care plan for preventive services as well as general preventive health recommendations were provided to patient.     Sandrea Hughs, NP   12/30/2020   Nurse Notes: Due for Zoster,Pneumococcal and COVID-19 booster vaccine.states provider at the facility will take care.

## 2020-12-31 ENCOUNTER — Encounter: Payer: Self-pay | Admitting: Internal Medicine

## 2020-12-31 ENCOUNTER — Ambulatory Visit (INDEPENDENT_AMBULATORY_CARE_PROVIDER_SITE_OTHER): Payer: Medicare Other | Admitting: Internal Medicine

## 2020-12-31 ENCOUNTER — Other Ambulatory Visit: Payer: Self-pay

## 2020-12-31 VITALS — BP 174/101 | HR 93 | Temp 98.1°F

## 2020-12-31 DIAGNOSIS — L0390 Cellulitis, unspecified: Secondary | ICD-10-CM

## 2020-12-31 NOTE — Progress Notes (Signed)
Patient Active Problem List   Diagnosis Date Noted   Abscess of right leg 12/24/2020   Cellulitis 12/10/2020   Cellulitis of right leg 12/09/2020   Memory loss, short term 12/13/2018   DDD (degenerative disc disease), cervical 12/13/2018   B12 deficiency 12/13/2018   Chronic cough 07/25/2017   Abnormal MMSE 07/25/2017   Type II diabetes mellitus with stage 3 chronic kidney disease ([place]) 05/31/2017   Sinus tachycardia    Multiple fractures of ramus of right pubis with routine healing    Closed pelvic fracture ([place]) 05/18/2017   Bilateral chronic knee pain 01/18/2017   Bilateral primary osteoarthritis of knee 04/07/2016   Hyperglycemia 10/01/2015   Fracture of 5th metatarsal 02/14/2015   CKD (chronic kidney disease) stage 3, GFR 30-59 ml/min (HCC) 12/10/2013   Sebaceous cyst 12/10/2013   Insomnia    Colon polyp    Diverticular stricture ([place])    Osteopenia    Vitamin D deficiency 10/31/2013   Indigestion    Primary osteoarthritis of hip    Chronic diastolic heart failure ([place]) 12/25/2012   Rapid resting heart rate 12/25/2012   Essential hypertension 12/25/2012   Hypercholesterolemia     Patient's Medications  New Prescriptions   No medications on file  Previous Medications   ACETAMINOPHEN (TYLENOL) 500 MG TABLET    Take 500 mg by mouth every 6 (six) hours as needed for mild pain, headache or fever.   ALENDRONATE (FOSAMAX) 70 MG TABLET    TAKE 1 TAB ONCE A WEEK, AT LEAST 30 MIN BEFORE 1ST FOOD.DO NOT LIE DOWN FOR 30 MIN AFTER TAKING.   ATORVASTATIN (LIPITOR) 40 MG TABLET    Take 1 tablet (40 mg total) by mouth daily.   CA PHOSPHATE-CHOLECALCIFEROL (CALTRATE GUMMY BITES PO)    Take 1 tablet by mouth daily.   CEFADROXIL (DURICEF) 500 MG CAPSULE    Take 2 capsules (1,000 mg total) by mouth 2 (two) times daily for 14 days.   DONEPEZIL (ARICEPT) 10 MG TABLET    TAKE ONE TABLET BY MOUTH AT BEDTIME   FAMOTIDINE (PEPCID) 20 MG TABLET    Take 20 mg by mouth daily.    FEXOFENADINE (ALLEGRA) 180 MG TABLET    Take 1 tablet (180 mg total) by mouth daily.   HYDROCHLOROTHIAZIDE (HYDRODIURIL) 25 MG TABLET    TAKE 1 TABLET ONCE A DAY WITH LOSARTAN UNTIL COMBO TABLET BACK AVAILABLE.   LOSARTAN (COZAAR) 100 MG TABLET    TAKE 1 TABLET DAILY WITH HCTZ TIL COMBINATION PILL AVAILABLE.   MEMANTINE (NAMENDA) 10 MG TABLET    TAKE 1 TABLET BY MOUTH TWICE DAILY.   METOPROLOL SUCCINATE (TOPROL-XL) 100 MG 24 HR TABLET    TAKE 1&1/2 TABLETS TWICE DAILY.   VITAMIN B-12 (CYANOCOBALAMIN) 1000 MCG TABLET    Take 1 tablet (1,000 mcg total) by mouth daily.  Modified Medications   No medications on file  Discontinued Medications   No medications on file    Subjective: 87 year old  with history of chronic diastolic heart failure, diabetes, dementia on Aricept and Namenda who sustained a fall over right knee, seen in the ED on December 01, 2020. Initially thought to be a contusion with swelling and bruising.  She was referred to physical therapy.  Subsequently developed worsening erythema and placed on doxycycline, but her symptoms did not improve.  She ended up being hospitalized on December 08, 2020 due to heart failure.  During hospitalization noted to have purulent  discharge form knee wound.  She was treated with empiric antibiotics vancomycin and cefepime and discharged on doxycycline and Keflex.   She subseuently developed 2 new areas with 1 draining purulent fluid at that time.   She was seen by Infectious Disease, Dr. Tommy Medal on 12/24/2020.  By that time she was also started by on cefadroxil(day prior)by primary care physician. During ID visit, cefadroxil dose was increased to 500 mg twice daily with close monitoring.  Doxycycline was discontinued.  MRI was ordered to look for possible abscess. Today, patient is accompanied by her daughter and healthcare aide.  Patient reports her knee has improved.  She is able to ambulate on her knee.  She does have slight drainage from inferior knee  wound but that has significantly improved as well.   MRI returned without signs of underlying abscess, myositis was noted without pyomyositis.  She denies fever, chills, nausea, vomiting, diarrhea. Review of Systems: Review of Systems  All other systems reviewed and are negative.  Past Medical History:  Diagnosis Date   Abscess of right leg 12/24/2020   Childhood asthma    Chronic diastolic heart failure ([place]) 12/25/2012   October 2013 echocardiogram 1. There is moderate concentric left ventricle hypertrophy. 2. Left ventricular ejection fraction estimated by 2D at 60-65 percent. 3. There were no regional wall motion abnormalities. 4. Mild mitral annular calcification. 5. Mild mitral valve regurgitation. 6. There is mild tricuspid regurgitation. 7. Mildly elevated estimated right ventricular systolic pressure. 8. Right ventricular systolic pressure estimated at 35-40 mm Hg. 9. Mild calcification of the aortic valve. 10. Trace aortic valve regurgitation. 11. Analysis of mitral valve inflow, pulmonary vein Doppler and tissue Doppler suggests grade I diastolic dysfunction without elevated left atrial pressure.    Colon polyp    Diverticular stricture ([place]) 2009   Diverticulosis 10/30/2007   DM (diabetes mellitus), type 2 ([place])    Edema 09/12/2013   Fall 05/18/2017   tripped over wheel of walker; superior and inferior pubic ramus fractures    Hypercholesterolemia    Hypertension    Hypertension, essential    Hypothyroidism    as a teenager   Indigestion    Insomnia    OA (osteoarthritis) of hip    right hip replacement, OA in bilateral knees   Osteopenia    Actonel stopped in 2012 after 5years therapy   Pneumonia 1939   "hospitalized"   Rapid resting heart rate 02/58/5277   Uncertain cause. Possible ectopic atrial tachycardia. Date entered 12/25/12    Right rotator cuff tear    Seasonal allergies     Social History   Tobacco Use   Smoking status: Former    Packs/day: 0.12     Years: 50.00    Pack years: 6.00    Types: Cigarettes   Smokeless tobacco: Never   Tobacco comments:    "stopped in the ate 1990s"  Vaping Use   Vaping Use: Never used  Substance Use Topics   Alcohol use: Yes    Comment: rarely   Drug use: Never    Family History  Problem Relation Age of Onset   Heart disease Mother    Heart disease Father    Heart disease Brother     Allergies  Allergen Reactions   Amlodipine Besylate Other (See Comments)    Edema   Lisinopril Cough    Cough    Sulfamethoxazole-Trimethoprim Other (See Comments)    Unknown reaction     Health Maintenance  Topic Date  Due   Zoster Vaccines- Shingrix (1 of 2) Never done   Pneumonia Vaccine 49+ Years old (2 - PPSV23 if available, else PCV20) 06/04/2015   OPHTHALMOLOGY EXAM  07/11/2020   COVID-19 Vaccine (4 - Booster for Moderna series) 12/19/2020   FOOT EXAM  01/08/2021   HEMOGLOBIN A1C  05/25/2021   TETANUS/TDAP  07/21/2027   INFLUENZA VACCINE  Completed   DEXA SCAN  Completed   HPV VACCINES  Aged Out    Objective:  Vitals:   12/31/20 0911  BP: (!) 174/101  Pulse: 93  Temp: 98.1 F (36.7 C)  SpO2: 94%   There is no height or weight on file to calculate BMI.  Physical Exam Constitutional:      Appearance: Normal appearance.  HENT:     Head: Normocephalic and atraumatic.     Right Ear: Tympanic membrane normal.     Left Ear: Tympanic membrane normal.     Nose: Nose normal.     Mouth/Throat:     Mouth: Mucous membranes are moist.  Eyes:     Extraocular Movements: Extraocular movements intact.     Conjunctiva/sclera: Conjunctivae normal.     Pupils: Pupils are equal, round, and reactive to light.  Cardiovascular:     Rate and Rhythm: Normal rate and regular rhythm.     Heart sounds: No murmur heard.   No friction rub. No gallop.  Pulmonary:     Effort: Pulmonary effort is normal.     Breath sounds: Normal breath sounds.  Abdominal:     General: Abdomen is flat.      Palpations: Abdomen is soft.  Musculoskeletal:        General: Normal range of motion.     Comments: 1 cm open wound on right knee without active drainage, surrounding erythema or tenderness. I cm wound above right knee is scabbed ober.  Skin:    General: Skin is warm and dry.  Neurological:     General: No focal deficit present.     Mental Status: She is alert and oriented to person, place, and time.  Psychiatric:        Mood and Affect: Mood normal.    Lab Results Lab Results  Component Value Date   WBC 8.4 12/24/2020   HGB 12.7 12/24/2020   HCT 39.0 12/24/2020   MCV 94.9 12/24/2020   PLT 223 12/24/2020    Lab Results  Component Value Date   CREATININE 1.10 (H) 12/24/2020   BUN 23 12/24/2020   NA 143 12/24/2020   K 4.5 12/24/2020   CL 106 12/24/2020   CO2 29 12/24/2020    Lab Results  Component Value Date   ALT 8 12/09/2020   AST 13 (L) 12/09/2020   ALKPHOS 88 12/09/2020   BILITOT 1.1 12/09/2020    Lab Results  Component Value Date   CHOL 164 07/17/2020   HDL 64 07/17/2020   LDLCALC 72 07/17/2020   TRIG 139 07/17/2020   No results found for: LABRPR, RPRTITER No results found for: HIV1RNAQUANT, HIV1RNAVL, CD4TABS   Problem List Items Addressed This Visit       Other   Cellulitis - Primary   Relevant Orders   CBC with Differential/Platelet   COMPLETE METABOLIC PANEL WITH GFR   CRP (C-Reactive Protein)   # Right lower extremity cellulitis-improving -Pt reports drainage from knee is far less. No fever. She is able to walk on her knee now. - MRI tibia-fibula with and without contrast on 12/25/2020  showed findings consistent with cellulitis, no subcutaneous abscess, myositis without suspicion for pyomyositis. Plan -Last dose of cefadroxil 500 mg PO bid on 01/08/21 to complete 2 weeks of antibiotic therapy for complicated skin soft tissue infection -Obtain cbc. cmp, crp today(pt plans of getting labs dome at her care facility) -Follow-up with Dr. Tommy Medal in  2 weeks.    Laurice Record, MD [facility] 12/31/2020, 2:27 PM

## 2020-12-31 NOTE — Progress Notes (Deleted)
°   ° ° ° ° °  [facility]  Date of Admission:  (Not on file)   Total days of inpatient antibiotics ***  Active Problems:   * No active hospital problems. *          Assessment: ***  Recommendations: ***   Microbiology:   Antibiotics:  Cultures: Blood  Urine  Other   SUBJECTIVE: ***  Review of Systems: ROS   Scheduled Meds: Continuous Infusions: PRN Meds:. Allergies  Allergen Reactions   Amlodipine Besylate Other (See Comments)    Edema   Lisinopril Cough    Cough    Sulfamethoxazole-Trimethoprim Other (See Comments)    Unknown reaction     OBJECTIVE: Vitals:   12/31/20 0911  BP: (!) 174/101  Pulse: 93  Temp: 98.1 F (36.7 C)  SpO2: 94%   There is no height or weight on file to calculate BMI.  Physical Exam    Lab Results Lab Results  Component Value Date   WBC 8.4 12/24/2020   HGB 12.7 12/24/2020   HCT 39.0 12/24/2020   MCV 94.9 12/24/2020   PLT 223 12/24/2020    Lab Results  Component Value Date   CREATININE 1.10 (H) 12/24/2020   BUN 23 12/24/2020   NA 143 12/24/2020   K 4.5 12/24/2020   CL 106 12/24/2020   CO2 29 12/24/2020    Lab Results  Component Value Date   ALT 8 12/09/2020   AST 13 (L) 12/09/2020   ALKPHOS 88 12/09/2020   BILITOT 1.1 12/09/2020      # LLE cellulitis-improving -Pt reports drainage from knee is far less. No fever. She is able to walk on her knee now. Plan -last dose of cefadroxil on 01/08/21 to complete 2 weeks of therapy for complicated skin soft tissue infection -cbc. Cmp, crp today -Follow-up with Dr. Tommy Medal in 2 weeks.  Laurice Record, MD [facility] 12/31/2020, 9:21 AM

## 2021-01-01 ENCOUNTER — Ambulatory Visit: Payer: Medicare Other | Admitting: Internal Medicine

## 2021-01-01 ENCOUNTER — Telehealth: Payer: Self-pay

## 2021-01-01 NOTE — Progress Notes (Signed)
This service is provided via telemedicine  No vital signs collected/recorded due to the encounter was a telemedicine visit.   Location of patient (ex: home, work):  Home  Patient consents to a telephone visit:  Patient consented  Location of the provider (ex: office, home):  [facility]  Name of any referring provider:  Veleta Miners, MD  Names of all persons participating in the telemedicine service and their role in the encounter:  Patient, Wahneta Konieczny and Marlowe Sax, NP  Time spent on call:  8 mins  Subjective:   Kelsey Salinas is a 87 y.o. female who presents for Medicare Annual (Subsequent) preventive examination.  Review of Systems     Cardiac Risk Factors include: advanced age (>74men, >52 women);hypertension;smoking/ tobacco exposure;dyslipidemia     Objective:    There were no vitals filed for this visit. There is no height or weight on file to calculate BMI.  Advanced Directives 12/10/2020 12/09/2020 12/01/2020 11/22/2020 07/23/2020 01/09/2020 12/25/2019  Does Patient Have a Medical Advance Directive? Yes Yes No No Yes Yes Yes  Type of Advance Directive [place];Living will Living will;Healthcare Power of Attorney Living will;Healthcare Power of Attorney  Does patient want to make changes to medical advance directive? No - Patient declined - - - No - Patient declined No - Patient declined No - Patient declined  Copy of [place] in Chart? No - copy requested - - - Yes - validated most recent copy scanned in chart (See row information) Yes - validated most recent copy scanned in chart (See row information) Yes - validated most recent copy scanned in chart (See row information)  Would patient like information on creating a medical advance directive? No - Patient declined - No - Patient declined No - Patient declined - - -  Pre-existing out of facility DNR order  (yellow form or pink MOST form) - - - - - - -    Current Medications (verified) Outpatient Encounter Medications as of 12/30/2020  Medication Sig   acetaminophen (TYLENOL) 500 MG tablet Take 500 mg by mouth every 6 (six) hours as needed for mild pain, headache or fever.   alendronate (FOSAMAX) 70 MG tablet TAKE 1 TAB ONCE A WEEK, AT LEAST 30 MIN BEFORE 1ST FOOD.DO NOT LIE DOWN FOR 30 MIN AFTER TAKING.   atorvastatin (LIPITOR) 40 MG tablet Take 1 tablet (40 mg total) by mouth daily.   Ca Phosphate-Cholecalciferol (CALTRATE GUMMY BITES PO) Take 1 tablet by mouth daily.   cefadroxil (DURICEF) 500 MG capsule Take 2 capsules (1,000 mg total) by mouth 2 (two) times daily for 14 days.   donepezil (ARICEPT) 10 MG tablet TAKE ONE TABLET BY MOUTH AT BEDTIME   famotidine (PEPCID) 20 MG tablet Take 20 mg by mouth daily.   fexofenadine (ALLEGRA) 180 MG tablet Take 1 tablet (180 mg total) by mouth daily.   hydrochlorothiazide (HYDRODIURIL) 25 MG tablet TAKE 1 TABLET ONCE A DAY WITH LOSARTAN UNTIL COMBO TABLET BACK AVAILABLE. (Patient taking differently: Take 25 mg by mouth daily.)   losartan (COZAAR) 100 MG tablet TAKE 1 TABLET DAILY WITH HCTZ TIL COMBINATION PILL AVAILABLE. (Patient taking differently: Take 100 mg by mouth daily.)   memantine (NAMENDA) 10 MG tablet TAKE 1 TABLET BY MOUTH TWICE DAILY. (Patient taking differently: Take 10 mg by mouth 2 (two) times daily.)   metoprolol succinate (TOPROL-XL) 100 MG 24 hr tablet TAKE 1&1/2 TABLETS TWICE  DAILY. (Patient taking differently: Take 150 mg by mouth 2 (two) times daily.)   vitamin B-12 (CYANOCOBALAMIN) 1000 MCG tablet Take 1 tablet (1,000 mcg total) by mouth daily.   No facility-administered encounter medications on file as of 12/30/2020.    Allergies (verified) Amlodipine besylate, Lisinopril, and Sulfamethoxazole-trimethoprim   History: Past Medical History:  Diagnosis Date   Abscess of right leg 12/24/2020   Childhood asthma    Chronic  diastolic heart failure ([place]) 12/25/2012   October 2013 echocardiogram 1. There is moderate concentric left ventricle hypertrophy. 2. Left ventricular ejection fraction estimated by 2D at 60-65 percent. 3. There were no regional wall motion abnormalities. 4. Mild mitral annular calcification. 5. Mild mitral valve regurgitation. 6. There is mild tricuspid regurgitation. 7. Mildly elevated estimated right ventricular systolic pressure. 8. Right ventricular systolic pressure estimated at 35-40 mm Hg. 9. Mild calcification of the aortic valve. 10. Trace aortic valve regurgitation. 11. Analysis of mitral valve inflow, pulmonary vein Doppler and tissue Doppler suggests grade I diastolic dysfunction without elevated left atrial pressure.    Colon polyp    Diverticular stricture ([place]) 2009   Diverticulosis 10/30/2007   DM (diabetes mellitus), type 2 ([place])    Edema 09/12/2013   Fall 05/18/2017   tripped over wheel of walker; superior and inferior pubic ramus fractures    Hypercholesterolemia    Hypertension    Hypertension, essential    Hypothyroidism    as a teenager   Indigestion    Insomnia    OA (osteoarthritis) of hip    right hip replacement, OA in bilateral knees   Osteopenia    Actonel stopped in 2012 after 5years therapy   Pneumonia 1939   "hospitalized"   Rapid resting heart rate 57/84/6962   Uncertain cause. Possible ectopic atrial tachycardia. Date entered 12/25/12    Right rotator cuff tear    Seasonal allergies    Past Surgical History:  Procedure Laterality Date   CATARACT EXTRACTION W/ INTRAOCULAR LENS  IMPLANT, BILATERAL Bilateral    COLONOSCOPY  2009   Dr. Redmond [facility]   DILATION AND CURETTAGE OF UTERUS     FRACTURE SURGERY     JOINT REPLACEMENT     ORIF WRIST FRACTURE Right    REDUCTION MAMMAPLASTY  1999   TOTAL HIP ARTHROPLASTY Right 03-04-08   Dr. Wynelle Link   Family History  Problem Relation Age of Onset   Heart disease Mother    Heart disease Father    Heart disease  Brother    Social History   Socioeconomic History   Marital status: Married    Spouse name: Not on file   Number of children: Not on file   Years of education: Not on file   Highest education level: Not on file  Occupational History   Occupation: retired travel agent  Tobacco Use   Smoking status: Former    Packs/day: 0.12    Years: 50.00    Pack years: 6.00    Types: Cigarettes   Smokeless tobacco: Never   Tobacco comments:    "stopped in the ate 1990s"  Vaping Use   Vaping Use: Never used  Substance and Sexual Activity   Alcohol use: Yes    Comment: rarely   Drug use: Never   Sexual activity: Not Currently  Other Topics Concern   Not on file  Social History Narrative   Lives at [place] since 2010   Husband Ilona Sorrel married 1952 (husband in memory care unit at [employer])   Former  smoker 1/2 PPD, for 30 years, stopped 01/10/1989   Alcohol 2 glasses of wine at night   Exercise: machines 3 times a week and daily walk   No POA/LW         Social Determinants of Health   Financial Resource Strain: Not on file  Food Insecurity: Not on file  Transportation Needs: Not on file  Physical Activity: Not on file  Stress: Not on file  Social Connections: Not on file    Tobacco Counseling Counseling given: Not Answered Tobacco comments: "stopped in the ate 1990s"   Clinical Intake:  Pre-visit preparation completed: No  Pain : No/denies pain     BMI - recorded: 27.55 Nutritional Status: BMI 25 -29 Overweight Nutritional Risks: None Diabetes: No  How often do you need to have someone help you when you read instructions, pamphlets, or other written materials from your doctor or pharmacy?: 3 - Sometimes What is the last grade level you completed in school?: College  Diabetic?No   Interpreter Needed?: No  Information entered by :: Dinah Ngetich,FNP-c   Activities of Daily Living In your present state of health, do you have any difficulty performing the  following activities: 12/30/2020 12/10/2020  Hearing? N Y  Vision? N N  Difficulty concentrating or making decisions? Y N  Comment [place] -  Walking or climbing stairs? Y Y  Comment uses wheelchair -  Dressing or bathing? Y N  Doing errands, shopping? [place] and eating ? N -  Using the Toilet? N -  In the past six months, have you accidently leaked urine? N -  Do you have problems with loss of bowel control? N -  Managing your Medications? Y -  Managing your Finances? Y -  Comment POA assist -  Housekeeping or managing your Housekeeping? Y -  Comment resides in facility -  Some recent data might be hidden    Patient Care Team: Virgie Dad, MD as PCP - General (Internal Medicine) Belva Crome, MD as PCP - Cardiology (Cardiology) Gaynelle Arabian, MD as Consulting Physician (Orthopedic Surgery) Belva Crome, MD as Consulting Physician (Cardiology) Monna Fam, MD as Consulting Physician (Ophthalmology) Elsie Saas, MD as Consulting Physician (Orthopedic Surgery)  Indicate any recent Medical Services you may have received from other than [facility] providers in the past year (date may be approximate).     Assessment:   This is a routine wellness examination for [place].  Hearing/Vision screen No results found.  Dietary issues and exercise activities discussed: Current Exercise Habits: The patient does not participate in regular exercise at present, Exercise limited by: Other - see comments (unsteady gait)   Goals Addressed             This Visit's Progress    Patient Stated       I want to be independent       Depression Screen PHQ 2/9 Scores 12/30/2020 12/24/2020 12/25/2019 05/09/2019 12/20/2018 12/13/2018 12/07/2017  PHQ - 2 Score 3 0 0 0 0 0 0  PHQ- 9 Score 5 - - - - - -    Fall Risk Fall Risk  12/30/2020 12/24/2020 12/22/2020 12/01/2020 07/23/2020  Falls in the past year? 1 - 0 0 0  Comment - - - - -  Number  falls in past yr: 0 0 0 0 0  Injury with Fall? 0 1 0 0 -  Comment - scraped knee and it got infected - - -  Risk for fall due to : Impaired mobility - No Fall Risks No Fall Risks -  Follow up Falls evaluation completed - Falls evaluation completed Falls evaluation completed Falls evaluation completed    [place]:  Any stairs in or around the home? No  If so, are there any without handrails? No  Home free of loose throw rugs in walkways, pet beds, electrical cords, etc? Yes  Adequate lighting in your home to reduce risk of falls? Yes   ASSISTIVE DEVICES UTILIZED TO PREVENT FALLS:  Life alert? Yes  Use of a cane, walker or w/c? Yes  Grab bars in the bathroom? Yes  Shower chair or bench in shower? No  Elevated toilet seat or a handicapped toilet? Yes   TIMED UP AND GO:  Was the test performed? No .  Length of time to ambulate 10 feet: N/A sec.   Gait unsteady with use of assistive device, provider informed and education provided.   Cognitive Function: MMSE - Mini Mental State Exam 07/23/2020 05/09/2019 07/19/2017 04/07/2016 04/02/2015  Orientation to time 4 4 4 5 5   Orientation to Place 5 5 5 5 5   Registration 3 3 3 3 3   Attention/ Calculation 1 5 5 5 5   Recall 2 3 1 3 3   Language- name 2 objects 2 2 2 2 2   Language- repeat 1 1 1 1 1   Language- follow 3 step command 3 3 3 3 3   Language- read & follow direction 1 1 1 1 1   Write a sentence 1 1 1 1 1   Copy design 1 1 1 1 1   Total score 24 29 27 30 30      6CIT Screen 12/30/2020 12/25/2019 12/20/2018  What Year? 4 points 0 points 0 points  What month? 0 points 0 points 0 points  What time? 0 points 0 points 0 points  Count back from 20 0 points 0 points 0 points  Months in reverse 0 points 0 points 0 points  Repeat phrase 6 points 8 points 0 points  Total Score 10 8 0    Immunizations Immunization History  Administered Date(s) Administered   Influenza, High Dose Seasonal PF 10/20/2018,  11/09/2019   Influenza,inj,Quad PF,6+ Mos 11/04/2017   Influenza-Unspecified 10/25/2013, 10/31/2014, 11/06/2015, 11/01/2016, 10/31/2020   Moderna SARS-COV2 Booster Vaccination 10/24/2020   Moderna Sars-Covid-2 Vaccination 01/23/2019, 02/20/2019, 11/27/2019   Pneumococcal Conjugate-13 06/04/2014   Tdap 07/19/2017    TDAP status: Up to date  Flu Vaccine status: Up to date  Pneumococcal vaccine status: Due, Education has been provided regarding the importance of this vaccine. Advised may receive this vaccine at local pharmacy or Health Dept. Aware to provide a copy of the vaccination record if obtained from local pharmacy or Health Dept. Verbalized acceptance and understanding.  Covid-19 vaccine status: Information provided on how to obtain vaccines.   Qualifies for Shingles Vaccine? Yes   Zostavax completed No   Shingrix Completed?: No.    Education has been provided regarding the importance of this vaccine. Patient has been advised to call insurance company to determine out of pocket expense if they have not yet received this vaccine. Advised may also receive vaccine at local pharmacy or Health Dept. Verbalized acceptance and understanding.  Screening Tests Health Maintenance  Topic Date Due   Zoster Vaccines- Shingrix (1 of 2) Never done   Pneumonia Vaccine 29+ Years old (2 - PPSV23 if available, else PCV20) 06/04/2015   OPHTHALMOLOGY EXAM  07/11/2020  COVID-19 Vaccine (4 - Booster for Moderna series) 12/19/2020   FOOT EXAM  01/08/2021   HEMOGLOBIN A1C  05/25/2021   TETANUS/TDAP  07/21/2027   INFLUENZA VACCINE  Completed   DEXA SCAN  Completed   HPV VACCINES  Aged Out    Health Maintenance  Health Maintenance Due  Topic Date Due   Zoster Vaccines- Shingrix (1 of 2) Never done   Pneumonia Vaccine 86+ Years old (2 - PPSV23 if available, else PCV20) 06/04/2015   OPHTHALMOLOGY EXAM  07/11/2020   COVID-19 Vaccine (4 - Booster for Moderna series) 12/19/2020    Colorectal  cancer screening: No longer required.   Mammogram status: No longer required due to due to age .  Bone Density status: Completed 12/21/2012. Results reflect: Bone density results: OSTEOPOROSIS. Repeat every 2 years.  Lung Cancer Screening: (Low Dose CT Chest recommended if Age 69-80 years, 30 pack-year currently smoking OR have quit w/in 15years.) does not qualify.   Lung Cancer Screening Referral: No   Additional Screening:  Hepatitis C Screening: does not qualify; Completed No   Vision Screening: Recommended annual ophthalmology exams for early detection of glaucoma and other disorders of the eye. Is the patient up to date with their annual eye exam?  No  Who is the provider or what is the name of the office in which the patient attends annual eye exams? Does not recall Ophthalmologist name  If pt is not established with a provider, would they like to be referred to a provider to establish care? No .   Dental Screening: Recommended annual dental exams for proper oral hygiene  Community Resource Referral / Chronic Care Management: CRR required this visit?  No   CCM required this visit?  No      Plan:     I have personally reviewed and noted the following in the patients chart:   Medical and social history Use of alcohol, tobacco or illicit drugs  Current medications and supplements including opioid prescriptions.  Functional ability and status Nutritional status Physical activity Advanced directives List of other physicians Hospitalizations, surgeries, and ER visits in previous 12 months Vitals Screenings to include cognitive, depression, and falls Referrals and appointments  In addition, I have reviewed and discussed with patient certain preventive protocols, quality metrics, and best practice recommendations. A written personalized care plan for preventive services as well as general preventive health recommendations were provided to patient.     Charlyne Petrin, [place]   01/01/2021   Nurse Notes: Due for Zoster,Pneumococcal and COVID-19 booster vaccine.states provider at the facility will take care.

## 2021-01-01 NOTE — Telephone Encounter (Signed)
I connected with  Kelsey Salinas on 12/30/2020 by a video enabled telemedicine application and verified that I am speaking with the correct person using two identifiers.   I discussed the limitations of evaluation and management by telemedicine. The patient expressed understanding and agreed to proceed.

## 2021-01-07 ENCOUNTER — Telehealth: Payer: Self-pay

## 2021-01-07 NOTE — Telephone Encounter (Signed)
Patient had appointment 12/21 and was unable to have labs drawn then. Called patient to offer lab appointment, she states her facility is planning on drawing the lab work tomorrow.   Beryle Flock, RN

## 2021-01-09 ENCOUNTER — Telehealth: Payer: Self-pay | Admitting: Adult Health

## 2021-01-09 DIAGNOSIS — R21 Rash and other nonspecific skin eruption: Secondary | ICD-10-CM

## 2021-01-09 MED ORDER — TRIAMCINOLONE ACETONIDE 0.1 % EX CREA: 1.0000 "application " | TOPICAL_CREAM | Freq: Two times a day (BID) | CUTANEOUS | 1 refills | Status: AC

## 2021-01-09 NOTE — Telephone Encounter (Signed)
Nurse Autumn notified me from [place] that [name] is having a flat itchy rash to her chest, shoulders, back, and neck. Has completed cephalosporin for knee infection. Given that she is diabetic will try topical steroid therapy first.

## 2021-01-13 DIAGNOSIS — I2510 Atherosclerotic heart disease of native coronary artery without angina pectoris: Secondary | ICD-10-CM | POA: Diagnosis not present

## 2021-01-13 DIAGNOSIS — L03115 Cellulitis of right lower limb: Secondary | ICD-10-CM | POA: Diagnosis not present

## 2021-01-14 DIAGNOSIS — R2689 Other abnormalities of gait and mobility: Secondary | ICD-10-CM | POA: Diagnosis not present

## 2021-01-14 DIAGNOSIS — F418 Other specified anxiety disorders: Secondary | ICD-10-CM | POA: Diagnosis not present

## 2021-01-14 DIAGNOSIS — Z9181 History of falling: Secondary | ICD-10-CM | POA: Diagnosis not present

## 2021-01-14 DIAGNOSIS — R2681 Unsteadiness on feet: Secondary | ICD-10-CM | POA: Diagnosis not present

## 2021-01-14 DIAGNOSIS — L03115 Cellulitis of right lower limb: Secondary | ICD-10-CM | POA: Diagnosis not present

## 2021-01-19 ENCOUNTER — Telehealth: Payer: Self-pay | Admitting: *Deleted

## 2021-01-19 NOTE — Telephone Encounter (Signed)
I spoke with Pollyann Glen PT at [facility]. He will call Ms. Karel to discuss this issue further. Thanks.

## 2021-01-19 NOTE — Telephone Encounter (Signed)
Patient called and stated that she would like to resume PT there at [facility].  Stated that when she stopped it, it was a mistake and she wants to restart it.   Wants you to contact the PT there at [facility] and have them restart her Physical Therapy.   Please Advise.

## 2021-02-13 ENCOUNTER — Other Ambulatory Visit: Payer: Self-pay | Admitting: Internal Medicine

## 2021-02-13 DIAGNOSIS — E7800 Pure hypercholesterolemia, unspecified: Secondary | ICD-10-CM

## 2021-03-24 DIAGNOSIS — E7800 Pure hypercholesterolemia, unspecified: Secondary | ICD-10-CM | POA: Diagnosis not present

## 2021-03-24 DIAGNOSIS — E1122 Type 2 diabetes mellitus with diabetic chronic kidney disease: Secondary | ICD-10-CM | POA: Diagnosis not present

## 2021-03-26 DIAGNOSIS — E1122 Type 2 diabetes mellitus with diabetic chronic kidney disease: Secondary | ICD-10-CM | POA: Diagnosis not present

## 2021-03-26 LAB — CBC: RBC: 4.73 (ref 3.87–5.11)

## 2021-03-26 LAB — CBC AND DIFFERENTIAL
HCT: 44 (ref 36–46)
Hemoglobin: 13.9 (ref 12.0–16.0)
Platelets: 178 10*3/uL (ref 150–400)
WBC: 7.9

## 2021-03-26 LAB — HEMOGLOBIN A1C: Hemoglobin A1C: 7

## 2021-04-01 ENCOUNTER — Encounter: Payer: Self-pay | Admitting: Internal Medicine

## 2021-04-01 ENCOUNTER — Non-Acute Institutional Stay: Payer: Medicare Other | Admitting: Internal Medicine

## 2021-04-01 ENCOUNTER — Other Ambulatory Visit: Payer: Self-pay

## 2021-04-01 ENCOUNTER — Telehealth: Payer: Self-pay

## 2021-04-01 VITALS — BP 132/78 | HR 92 | Temp 97.7°F | Ht 68.0 in | Wt 183.0 lb

## 2021-04-01 DIAGNOSIS — N1831 Chronic kidney disease, stage 3a: Secondary | ICD-10-CM | POA: Diagnosis not present

## 2021-04-01 DIAGNOSIS — M810 Age-related osteoporosis without current pathological fracture: Secondary | ICD-10-CM | POA: Diagnosis not present

## 2021-04-01 DIAGNOSIS — E1122 Type 2 diabetes mellitus with diabetic chronic kidney disease: Secondary | ICD-10-CM

## 2021-04-01 DIAGNOSIS — N1832 Chronic kidney disease, stage 3b: Secondary | ICD-10-CM

## 2021-04-01 DIAGNOSIS — E7800 Pure hypercholesterolemia, unspecified: Secondary | ICD-10-CM

## 2021-04-01 DIAGNOSIS — I5032 Chronic diastolic (congestive) heart failure: Secondary | ICD-10-CM | POA: Diagnosis not present

## 2021-04-01 DIAGNOSIS — I10 Essential (primary) hypertension: Secondary | ICD-10-CM | POA: Diagnosis not present

## 2021-04-01 DIAGNOSIS — N184 Chronic kidney disease, stage 4 (severe): Secondary | ICD-10-CM

## 2021-04-01 NOTE — Telephone Encounter (Signed)
Patient called requesting clarification on which to stop.  ? ?Called patient no answer. LMOM to return call. ? ?Patient is to stop the fosamax per Dr. Lyndel Safe. ?

## 2021-04-01 NOTE — Telephone Encounter (Signed)
Lelan Pons, patient's worker, called and with verbal consent from patient, information given and medication list updated.  ?

## 2021-04-01 NOTE — Progress Notes (Signed)
? ?Location:  [facility] ?  ?Place of Service:  Clinic (12) ? ?Provider:  ? ?Code Status: DNR ?Goals of Care:  ? ?  12/10/2020  ? 12:15 AM  ?Advanced Directives  ?Does Patient Have a Medical Advance Directive? Yes  ?Type of Advance Directive Healthcare Power of Attorney  ?Does patient want to make changes to medical advance directive? No - Patient declined  ?Copy of [place] in Chart? No - copy requested  ?Would patient like information on creating a medical advance directive? No - Patient declined  ? ? ? ?Chief Complaint  ?Patient presents with  ? Medical Management of Chronic Issues  ? ? ?HPI: Patient is a 88 y.o. female seen today for medical management of chronic diseases.   ? ?Patient is active issues ?Has history of Alzheimer's dementia ?Doing well ?On Namenda and Aricept ?Has 24 7 caregivers ?Mostly uses wheelchair for Long distance can do her transfers but always have someone to help ?Mood is stable No falls No Behavior issues ? ?Also has h/o Diastolic CHF, HLD, Arthritis ?H/o Osteoporosis ?Cellulitis of Left Knee resolved now ? ?No other complains today ?Daughter Pharmacist and son is cardiologist ? ?Wt Readings from Last 3 Encounters:  ?04/01/21 183 lb (83 kg)  ?12/09/20 170 lb (77.1 kg)  ?07/23/20 181 lb 3.2 oz (82.2 kg)  ?  ? ?Past Medical History:  ?Diagnosis Date  ? Abscess of right leg 12/24/2020  ? Childhood asthma   ? Chronic diastolic heart failure ([place]) 12/25/2012  ? October 2013 echocardiogram 1. There is moderate concentric left ventricle hypertrophy. 2. Left ventricular ejection fraction estimated by 2D at 60-65 percent. 3. There were no regional wall motion abnormalities. 4. Mild mitral annular calcification. 5. Mild mitral valve regurgitation. 6. There is mild tricuspid regurgitation. 7. Mildly elevated estimated right ventricular systolic pressure. 8. Right ventricular systolic pressure estimated at 35-40 mm Hg. 9. Mild calcification of the aortic  valve. 10. Trace aortic valve regurgitation. 11. Analysis of mitral valve inflow, pulmonary vein Doppler and tissue Doppler suggests grade I diastolic dysfunction without elevated left atrial pressure.   ? Colon polyp   ? Diverticular stricture ([place]) 2009  ? Diverticulosis 10/30/2007  ? DM (diabetes mellitus), type 2 ([place])   ? Edema 09/12/2013  ? Fall 05/18/2017  ? tripped over wheel of walker; superior and inferior pubic ramus fractures   ? Hypercholesterolemia   ? Hypertension   ? Hypertension, essential   ? Hypothyroidism   ? as a teenager  ? Indigestion   ? Insomnia   ? OA (osteoarthritis) of hip   ? right hip replacement, OA in bilateral knees  ? Osteopenia   ? Actonel stopped in 2012 after 5years therapy  ? Pneumonia 1939  ? "hospitalized"  ? Rapid resting heart rate 12/25/2012  ? Uncertain cause. Possible ectopic atrial tachycardia. Date entered 12/25/12   ? Right rotator cuff tear   ? Seasonal allergies   ? ? ?Past Surgical History:  ?Procedure Laterality Date  ? CATARACT EXTRACTION W/ INTRAOCULAR LENS  IMPLANT, BILATERAL Bilateral   ? COLONOSCOPY  2009  ? Dr. Redmond School  ? DILATION AND CURETTAGE OF UTERUS    ? FRACTURE SURGERY    ? JOINT REPLACEMENT    ? ORIF WRIST FRACTURE Right   ? REDUCTION MAMMAPLASTY  1999  ? TOTAL HIP ARTHROPLASTY Right 03-04-08  ? Dr. Wynelle Link  ? ? ?Allergies  ?Allergen Reactions  ? Amlodipine Besylate Other (See Comments)  ?  Edema  ? Lisinopril Cough  ?  Cough ?  ? Sulfamethoxazole-Trimethoprim Other (See Comments)  ?  Unknown reaction ?  ? ? ?Outpatient Encounter Medications as of 04/01/2021  ?Medication Sig  ? acetaminophen (TYLENOL) 500 MG tablet Take 500 mg by mouth every 6 (six) hours as needed for mild pain, headache or fever.  ? alendronate (FOSAMAX) 70 MG tablet TAKE 1 TAB ONCE A WEEK, AT LEAST 30 MIN BEFORE 1ST FOOD.DO NOT LIE DOWN FOR 30 MIN AFTER TAKING.  ? atorvastatin (LIPITOR) 40 MG tablet TAKE 1 TABLET ONCE DAILY.  ? Ca Phosphate-Cholecalciferol (CALTRATE GUMMY BITES PO)  Take 1 tablet by mouth daily.  ? donepezil (ARICEPT) 10 MG tablet TAKE ONE TABLET BY MOUTH AT BEDTIME  ? famotidine (PEPCID) 20 MG tablet Take 20 mg by mouth daily.  ? fexofenadine (ALLEGRA) 180 MG tablet Take 1 tablet (180 mg total) by mouth daily.  ? hydrochlorothiazide (HYDRODIURIL) 25 MG tablet TAKE 1 TABLET ONCE A DAY WITH LOSARTAN UNTIL COMBO TABLET BACK AVAILABLE. (Patient taking differently: Take 25 mg by mouth daily.)  ? losartan (COZAAR) 100 MG tablet TAKE 1 TABLET DAILY WITH HCTZ TIL COMBINATION PILL AVAILABLE. (Patient taking differently: Take 100 mg by mouth daily.)  ? memantine (NAMENDA) 10 MG tablet TAKE 1 TABLET BY MOUTH TWICE DAILY. (Patient taking differently: Take 10 mg by mouth 2 (two) times daily.)  ? metoprolol succinate (TOPROL-XL) 100 MG 24 hr tablet TAKE 1&1/2 TABLETS TWICE DAILY. (Patient taking differently: Take 150 mg by mouth 2 (two) times daily.)  ? vitamin B-12 (CYANOCOBALAMIN) 1000 MCG tablet Take 1 tablet (1,000 mcg total) by mouth daily.  ? ?No facility-administered encounter medications on file as of 04/01/2021.  ? ? ?Review of Systems:  ?Review of Systems  ?Constitutional:  Negative for activity change and appetite change.  ?HENT: Negative.    ?Respiratory:  Negative for cough and shortness of breath.   ?Cardiovascular:  Negative for leg swelling.  ?Gastrointestinal:  Negative for constipation.  ?Genitourinary: Negative.   ?Musculoskeletal:  Positive for gait problem. Negative for arthralgias and myalgias.  ?Skin: Negative.   ?Neurological:  Negative for dizziness and weakness.  ?Psychiatric/Behavioral:  Positive for confusion. Negative for dysphoric mood and sleep disturbance.   ? ?Health Maintenance  ?Topic Date Due  ? Zoster Vaccines- Shingrix (1 of 2) Never done  ? Pneumonia Vaccine 15+ Years old (2 - PPSV23 if available, else PCV20) 06/04/2015  ? OPHTHALMOLOGY EXAM  07/11/2020  ? COVID-19 Vaccine (4 - Booster for Moderna series) 12/19/2020  ? FOOT EXAM  01/08/2021  ?  HEMOGLOBIN A1C  05/25/2021  ? TETANUS/TDAP  07/21/2027  ? INFLUENZA VACCINE  Completed  ? DEXA SCAN  Completed  ? HPV VACCINES  Aged Out  ? ? ?Physical Exam: ?Vitals:  ? 04/01/21 1006  ?BP: 132/78  ?Pulse: 92  ?Temp: 97.7 ?F (36.5 ?C)  ?SpO2: 94%  ?Weight: 183 lb (83 kg)  ?Height: '5\' 8"'$  (1.727 m)  ? ?Body mass index is 27.83 kg/m?Marland Kitchen ?Physical Exam ?Vitals reviewed.  ?Constitutional:   ?   Appearance: Normal appearance.  ?HENT:  ?   Head: Normocephalic.  ?   Nose: Nose normal.  ?   Mouth/Throat:  ?   Mouth: Mucous membranes are moist.  ?   Pharynx: Oropharynx is clear.  ?Eyes:  ?   Pupils: Pupils are equal, round, and reactive to light.  ?Cardiovascular:  ?   Rate and Rhythm: Normal rate and regular rhythm.  ?   Pulses:  Normal pulses.  ?   Heart sounds: Normal heart sounds. No murmur heard. ?Pulmonary:  ?   Effort: Pulmonary effort is normal.  ?   Breath sounds: Normal breath sounds.  ?Abdominal:  ?   General: Abdomen is flat. Bowel sounds are normal.  ?   Palpations: Abdomen is soft.  ?Musculoskeletal:     ?   General: No swelling.  ?   Cervical back: Neck supple.  ?   Comments: Had small Skin tear in her Right Lower leg Not infected ?Feet exams Good Pulses and No sensory loss ?No Open Wounds or cuts  ?Skin: ?   General: Skin is warm.  ?Neurological:  ?   General: No focal deficit present.  ?   Mental Status: She is alert.  ?Psychiatric:     ?   Mood and Affect: Mood normal.     ?   Thought Content: Thought content normal.  ? ? ?Labs reviewed: ?Basic Metabolic Panel: ?Recent Labs  ?  07/17/20 ?0000 11/25/20 ?0000 12/10/20 ?1610 12/11/20 ?9604 12/24/20 ?1546  ?NA 143   < > 137 138 143  ?K 3.8   < > 3.3* 3.9 4.5  ?CL 104   < > 105 109 106  ?CO2 26*   < > 24 21* 29  ?GLUCOSE  --    < > 122* 111* 97  ?BUN 35*   < > 42* 36* 23  ?CREATININE 1.4*   < > 1.22* 1.17* 1.10*  ?CALCIUM 9.6   < > 8.5* 8.3* 9.5  ?MG  --   --   --  1.8  --   ?TSH 0.98  --   --   --   --   ? < > = values in this interval not displayed.  ? ?Liver  Function Tests: ?Recent Labs  ?  07/17/20 ?0000 11/25/20 ?0000 11/26/20 ?0000 12/09/20 ?2040  ?AST 18  --  13 13*  ?ALT 14  --  10 8  ?ALKPHOS 96  --  75 88  ?BILITOT  --   --   --  1.1  ?PROT  --   --   --  6.9  ?AL

## 2021-04-05 ENCOUNTER — Encounter: Payer: Self-pay | Admitting: Internal Medicine

## 2021-04-09 DIAGNOSIS — M6389 Disorders of muscle in diseases classified elsewhere, multiple sites: Secondary | ICD-10-CM | POA: Diagnosis not present

## 2021-04-09 DIAGNOSIS — M63811 Disorders of muscle in diseases classified elsewhere, right shoulder: Secondary | ICD-10-CM | POA: Diagnosis not present

## 2021-04-09 DIAGNOSIS — I5032 Chronic diastolic (congestive) heart failure: Secondary | ICD-10-CM | POA: Diagnosis not present

## 2021-04-09 DIAGNOSIS — R278 Other lack of coordination: Secondary | ICD-10-CM | POA: Diagnosis not present

## 2021-04-09 DIAGNOSIS — M170 Bilateral primary osteoarthritis of knee: Secondary | ICD-10-CM | POA: Diagnosis not present

## 2021-04-09 DIAGNOSIS — Z9181 History of falling: Secondary | ICD-10-CM | POA: Diagnosis not present

## 2021-04-09 DIAGNOSIS — M1611 Unilateral primary osteoarthritis, right hip: Secondary | ICD-10-CM | POA: Diagnosis not present

## 2021-04-09 DIAGNOSIS — R2689 Other abnormalities of gait and mobility: Secondary | ICD-10-CM | POA: Diagnosis not present

## 2021-04-14 DIAGNOSIS — M1611 Unilateral primary osteoarthritis, right hip: Secondary | ICD-10-CM | POA: Diagnosis not present

## 2021-04-14 DIAGNOSIS — M63811 Disorders of muscle in diseases classified elsewhere, right shoulder: Secondary | ICD-10-CM | POA: Diagnosis not present

## 2021-04-14 DIAGNOSIS — R278 Other lack of coordination: Secondary | ICD-10-CM | POA: Diagnosis not present

## 2021-04-14 DIAGNOSIS — R2689 Other abnormalities of gait and mobility: Secondary | ICD-10-CM | POA: Diagnosis not present

## 2021-04-14 DIAGNOSIS — Z9181 History of falling: Secondary | ICD-10-CM | POA: Diagnosis not present

## 2021-04-14 DIAGNOSIS — M170 Bilateral primary osteoarthritis of knee: Secondary | ICD-10-CM | POA: Diagnosis not present

## 2021-04-14 DIAGNOSIS — I5032 Chronic diastolic (congestive) heart failure: Secondary | ICD-10-CM | POA: Diagnosis not present

## 2021-04-14 DIAGNOSIS — M6389 Disorders of muscle in diseases classified elsewhere, multiple sites: Secondary | ICD-10-CM | POA: Diagnosis not present

## 2021-04-21 DIAGNOSIS — M63811 Disorders of muscle in diseases classified elsewhere, right shoulder: Secondary | ICD-10-CM | POA: Diagnosis not present

## 2021-04-21 DIAGNOSIS — M170 Bilateral primary osteoarthritis of knee: Secondary | ICD-10-CM | POA: Diagnosis not present

## 2021-04-21 DIAGNOSIS — M1611 Unilateral primary osteoarthritis, right hip: Secondary | ICD-10-CM | POA: Diagnosis not present

## 2021-04-21 DIAGNOSIS — M6389 Disorders of muscle in diseases classified elsewhere, multiple sites: Secondary | ICD-10-CM | POA: Diagnosis not present

## 2021-04-21 DIAGNOSIS — I5032 Chronic diastolic (congestive) heart failure: Secondary | ICD-10-CM | POA: Diagnosis not present

## 2021-04-21 DIAGNOSIS — R278 Other lack of coordination: Secondary | ICD-10-CM | POA: Diagnosis not present

## 2021-04-21 DIAGNOSIS — R2689 Other abnormalities of gait and mobility: Secondary | ICD-10-CM | POA: Diagnosis not present

## 2021-04-21 DIAGNOSIS — Z9181 History of falling: Secondary | ICD-10-CM | POA: Diagnosis not present

## 2021-05-05 DIAGNOSIS — R2689 Other abnormalities of gait and mobility: Secondary | ICD-10-CM | POA: Diagnosis not present

## 2021-05-05 DIAGNOSIS — M1611 Unilateral primary osteoarthritis, right hip: Secondary | ICD-10-CM | POA: Diagnosis not present

## 2021-05-05 DIAGNOSIS — M63811 Disorders of muscle in diseases classified elsewhere, right shoulder: Secondary | ICD-10-CM | POA: Diagnosis not present

## 2021-05-05 DIAGNOSIS — M170 Bilateral primary osteoarthritis of knee: Secondary | ICD-10-CM | POA: Diagnosis not present

## 2021-05-05 DIAGNOSIS — Z9181 History of falling: Secondary | ICD-10-CM | POA: Diagnosis not present

## 2021-05-05 DIAGNOSIS — R278 Other lack of coordination: Secondary | ICD-10-CM | POA: Diagnosis not present

## 2021-05-05 DIAGNOSIS — M6389 Disorders of muscle in diseases classified elsewhere, multiple sites: Secondary | ICD-10-CM | POA: Diagnosis not present

## 2021-05-05 DIAGNOSIS — I5032 Chronic diastolic (congestive) heart failure: Secondary | ICD-10-CM | POA: Diagnosis not present

## 2021-05-08 NOTE — Telephone Encounter (Signed)
You're welcome!

## 2021-05-08 NOTE — Telephone Encounter (Signed)
I called patient son Shanon Brow immediately and notified him that Royal Hawthorn, NP wouldn't have availability until May 15th, 2023. Which is much too far out considering patient current needs. I offered sooner appointment next week with PCP Virgie Dad, MD May 3rd at 9:20am. Patient son Shanon Brow agreed and states that he will notify mother of upcoming appointment. Message routed to PCP Virgie Dad, MD and NP as Juluis Rainier.  ?

## 2021-05-08 NOTE — Telephone Encounter (Signed)
Thank you :)

## 2021-05-13 ENCOUNTER — Encounter: Payer: Self-pay | Admitting: Internal Medicine

## 2021-05-13 ENCOUNTER — Ambulatory Visit: Payer: Medicare Other | Admitting: Internal Medicine

## 2021-05-13 VITALS — BP 146/96 | HR 84 | Temp 97.7°F | Ht 68.0 in

## 2021-05-13 DIAGNOSIS — I10 Essential (primary) hypertension: Secondary | ICD-10-CM | POA: Diagnosis not present

## 2021-05-13 DIAGNOSIS — F330 Major depressive disorder, recurrent, mild: Secondary | ICD-10-CM | POA: Diagnosis not present

## 2021-05-13 DIAGNOSIS — J301 Allergic rhinitis due to pollen: Secondary | ICD-10-CM | POA: Diagnosis not present

## 2021-05-13 MED ORDER — ESCITALOPRAM OXALATE 5 MG PO TABS: 5.0000 mg | ORAL_TABLET | Freq: Every day | ORAL | 1 refills | Status: DC

## 2021-05-13 NOTE — Progress Notes (Signed)
? ?Location:   ?  ?Place of Service:    ? ?Provider:  ? ?Code Status: DNR ?Goals of Care:  ? ?  05/13/2021  ?  9:12 AM  ?Advanced Directives  ?Does Patient Have a Medical Advance Directive? Yes  ?Type of Paramedic of [place];Living will  ?Does patient want to make changes to medical advance directive? No - Patient declined  ?Copy of [place] in Chart? Yes - validated most recent copy scanned in chart (See row information)  ? ? ? ?Chief Complaint  ?Patient presents with  ? Acute Visit  ? Health Maintenance  ? ? ?HPI: Patient is a 88 y.o. female seen today for an acute visit for Possible depression ? ?Patient is active issues ?Has history of Alzheimer's dementia ?Doing well ?On Namenda and Aricept ?Has 24 7 caregivers ?Mostly uses wheelchair for Long distance can do her transfers but always have someone to help ? ?Recently her son has noticed her to be more depressed. Patient says she does feel sad sometimes ?Sleeping good. Not tearful Not suicidal ?Appetite good ?Weight stable ?Does not like to go to Lunch room mostly stay in her apartment. ?Open to trying Antidepressant ? ?Allergies ?Per caregiver with her patient has cough for past few weeks with running nose ?She refuses to use Flonase ?No fever or Chest pain ? ?h/o Diastolic CHF, HLD, Arthritis ?H/o Osteoporosis ?Cellulitis of Left Knee resolved now ?  ?Daughter Pharmacist and son is cardiologist ?Past Medical History:  ?Diagnosis Date  ? Abscess of right leg 12/24/2020  ? Childhood asthma   ? Chronic diastolic heart failure ([place]) 12/25/2012  ? October 2013 echocardiogram 1. There is moderate concentric left ventricle hypertrophy. 2. Left ventricular ejection fraction estimated by 2D at 60-65 percent. 3. There were no regional wall motion abnormalities. 4. Mild mitral annular calcification. 5. Mild mitral valve regurgitation. 6. There is mild tricuspid regurgitation. 7. Mildly elevated estimated right ventricular  systolic pressure. 8. Right ventricular systolic pressure estimated at 35-40 mm Hg. 9. Mild calcification of the aortic valve. 10. Trace aortic valve regurgitation. 11. Analysis of mitral valve inflow, pulmonary vein Doppler and tissue Doppler suggests grade I diastolic dysfunction without elevated left atrial pressure.   ? Colon polyp   ? Diverticular stricture ([place]) 2009  ? Diverticulosis 10/30/2007  ? DM (diabetes mellitus), type 2 ([place])   ? Edema 09/12/2013  ? Fall 05/18/2017  ? tripped over wheel of walker; superior and inferior pubic ramus fractures   ? Hypercholesterolemia   ? Hypertension   ? Hypertension, essential   ? Hypothyroidism   ? as a teenager  ? Indigestion   ? Insomnia   ? OA (osteoarthritis) of hip   ? right hip replacement, OA in bilateral knees  ? Osteopenia   ? Actonel stopped in 2012 after 5years therapy  ? Pneumonia 1939  ? "hospitalized"  ? Rapid resting heart rate 12/25/2012  ? Uncertain cause. Possible ectopic atrial tachycardia. Date entered 12/25/12   ? Right rotator cuff tear   ? Seasonal allergies   ? ? ?Past Surgical History:  ?Procedure Laterality Date  ? CATARACT EXTRACTION W/ INTRAOCULAR LENS  IMPLANT, BILATERAL Bilateral   ? COLONOSCOPY  2009  ? Dr. Redmond School  ? DILATION AND CURETTAGE OF UTERUS    ? FRACTURE SURGERY    ? JOINT REPLACEMENT    ? ORIF WRIST FRACTURE Right   ? REDUCTION MAMMAPLASTY  1999  ? TOTAL HIP ARTHROPLASTY Right 03-04-08  ?  Dr. Wynelle Link  ? ? ?Allergies  ?Allergen Reactions  ? Amlodipine Besylate Other (See Comments)  ?  Edema  ? Lisinopril Cough  ?  Cough ?  ? Sulfamethoxazole-Trimethoprim Other (See Comments)  ?  Unknown reaction ?  ? ? ?Outpatient Encounter Medications as of 05/13/2021  ?Medication Sig  ? acetaminophen (TYLENOL) 500 MG tablet Take 500 mg by mouth every 6 (six) hours as needed for mild pain, headache or fever.  ? atorvastatin (LIPITOR) 40 MG tablet TAKE 1 TABLET ONCE DAILY.  ? Ca Phosphate-Cholecalciferol (CALTRATE GUMMY BITES PO) Take 1 tablet by  mouth daily.  ? donepezil (ARICEPT) 10 MG tablet TAKE ONE TABLET BY MOUTH AT BEDTIME  ? escitalopram (LEXAPRO) 5 MG tablet Take 1 tablet (5 mg total) by mouth daily.  ? famotidine (PEPCID) 20 MG tablet Take 20 mg by mouth daily.  ? fexofenadine (ALLEGRA) 180 MG tablet Take 1 tablet (180 mg total) by mouth daily.  ? hydrochlorothiazide (HYDRODIURIL) 25 MG tablet TAKE 1 TABLET ONCE A DAY WITH LOSARTAN UNTIL COMBO TABLET BACK AVAILABLE. (Patient taking differently: Take 25 mg by mouth daily.)  ? losartan (COZAAR) 100 MG tablet TAKE 1 TABLET DAILY WITH HCTZ TIL COMBINATION PILL AVAILABLE. (Patient taking differently: Take 100 mg by mouth daily.)  ? memantine (NAMENDA) 10 MG tablet TAKE 1 TABLET BY MOUTH TWICE DAILY. (Patient taking differently: Take 10 mg by mouth 2 (two) times daily.)  ? metoprolol succinate (TOPROL-XL) 100 MG 24 hr tablet TAKE 1&1/2 TABLETS TWICE DAILY. (Patient taking differently: Take 150 mg by mouth 2 (two) times daily.)  ? vitamin B-12 (CYANOCOBALAMIN) 1000 MCG tablet Take 1 tablet (1,000 mcg total) by mouth daily.  ? ?No facility-administered encounter medications on file as of 05/13/2021.  ? ? ?Review of Systems:  ?Review of Systems  ?Constitutional:  Negative for activity change and appetite change.  ?HENT:  Positive for postnasal drip and rhinorrhea.   ?Respiratory:  Negative for cough and shortness of breath.   ?Cardiovascular:  Negative for leg swelling.  ?Gastrointestinal:  Negative for constipation.  ?Genitourinary: Negative.   ?Musculoskeletal:  Positive for gait problem. Negative for arthralgias and myalgias.  ?Skin: Negative.   ?Neurological:  Negative for dizziness and weakness.  ?Psychiatric/Behavioral:  Positive for confusion and dysphoric mood. Negative for sleep disturbance.   ? ?Health Maintenance  ?Topic Date Due  ? Zoster Vaccines- Shingrix (1 of 2) Never done  ? OPHTHALMOLOGY EXAM  07/11/2020  ? COVID-19 Vaccine (4 - Booster for Moderna series) 12/19/2020  ? INFLUENZA VACCINE   08/11/2021  ? HEMOGLOBIN A1C  09/26/2021  ? FOOT EXAM  04/02/2022  ? TETANUS/TDAP  07/21/2027  ? Pneumonia Vaccine 39+ Years old  Completed  ? DEXA SCAN  Completed  ? HPV VACCINES  Aged Out  ? ? ?Physical Exam: ?Vitals:  ? 05/13/21 0918  ?BP: (!) 146/96  ?Pulse: 84  ?Temp: 97.7 ?F (36.5 ?C)  ?SpO2: 94%  ?Height: '5\' 8"'$  (1.727 m)  ? ?Body mass index is 27.83 kg/m?Marland Kitchen ?Physical Exam ?Vitals reviewed.  ?Constitutional:   ?   Appearance: Normal appearance.  ?HENT:  ?   Head: Normocephalic.  ?   Nose:  ?   Comments: Mild Congestion Bilateral Nares ?   Mouth/Throat:  ?   Mouth: Mucous membranes are moist.  ?   Pharynx: Oropharynx is clear.  ?Eyes:  ?   Pupils: Pupils are equal, round, and reactive to light.  ?Cardiovascular:  ?   Rate and Rhythm: Normal rate and  regular rhythm.  ?   Pulses: Normal pulses.  ?   Heart sounds: Normal heart sounds. No murmur heard. ?Pulmonary:  ?   Effort: Pulmonary effort is normal.  ?   Breath sounds: Normal breath sounds.  ?Abdominal:  ?   General: Abdomen is flat. Bowel sounds are normal.  ?   Palpations: Abdomen is soft.  ?Musculoskeletal:     ?   General: No swelling.  ?   Cervical back: Neck supple.  ?Skin: ?   General: Skin is warm.  ?Neurological:  ?   General: No focal deficit present.  ?   Mental Status: She is alert.  ?Psychiatric:     ?   Mood and Affect: Mood normal.     ?   Thought Content: Thought content normal.  ? ? ?Labs reviewed: ?Basic Metabolic Panel: ?Recent Labs  ?  07/17/20 ?0000 11/25/20 ?0000 12/10/20 ?2229 12/11/20 ?7989 12/24/20 ?1546  ?NA 143   < > 137 138 143  ?K 3.8   < > 3.3* 3.9 4.5  ?CL 104   < > 105 109 106  ?CO2 26*   < > 24 21* 29  ?GLUCOSE  --    < > 122* 111* 97  ?BUN 35*   < > 42* 36* 23  ?CREATININE 1.4*   < > 1.22* 1.17* 1.10*  ?CALCIUM 9.6   < > 8.5* 8.3* 9.5  ?MG  --   --   --  1.8  --   ?TSH 0.98  --   --   --   --   ? < > = values in this interval not displayed.  ? ?Liver Function Tests: ?Recent Labs  ?  07/17/20 ?0000 11/25/20 ?0000 11/26/20 ?0000  12/09/20 ?2040  ?AST 18  --  13 13*  ?ALT 14  --  10 8  ?ALKPHOS 96  --  75 88  ?BILITOT  --   --   --  1.1  ?PROT  --   --   --  6.9  ?ALBUMIN 4.2 3.5  --  4.0  ? ?No results for input(s): LIPASE, AMYLASE i

## 2021-05-13 NOTE — Patient Instructions (Addendum)
Flonase 2 spray in Nostril everyday for 3 weeks ?Blood test in 2 weeks ?Check BP q weekly and bring it next visit ?

## 2021-05-14 ENCOUNTER — Telehealth: Payer: Self-pay | Admitting: *Deleted

## 2021-05-14 NOTE — Telephone Encounter (Signed)
Patient called and stated that the new medication Prescribed Escitalpram is causing a side effect of Diarrhea.  ?Patient wants to know if she should continue it.  ? ?Please Advise.  ?

## 2021-05-14 NOTE — Telephone Encounter (Signed)
Patient notified and agreed.  

## 2021-05-14 NOTE — Telephone Encounter (Signed)
Lets hold it for now ?Try again next week. If diarrhea comes again will discontinue it. ?

## 2021-05-28 DIAGNOSIS — M1611 Unilateral primary osteoarthritis, right hip: Secondary | ICD-10-CM | POA: Diagnosis not present

## 2021-05-28 DIAGNOSIS — M170 Bilateral primary osteoarthritis of knee: Secondary | ICD-10-CM | POA: Diagnosis not present

## 2021-05-28 DIAGNOSIS — R278 Other lack of coordination: Secondary | ICD-10-CM | POA: Diagnosis not present

## 2021-05-28 DIAGNOSIS — I5032 Chronic diastolic (congestive) heart failure: Secondary | ICD-10-CM | POA: Diagnosis not present

## 2021-05-28 DIAGNOSIS — M63811 Disorders of muscle in diseases classified elsewhere, right shoulder: Secondary | ICD-10-CM | POA: Diagnosis not present

## 2021-05-28 DIAGNOSIS — M6389 Disorders of muscle in diseases classified elsewhere, multiple sites: Secondary | ICD-10-CM | POA: Diagnosis not present

## 2021-05-28 DIAGNOSIS — R2689 Other abnormalities of gait and mobility: Secondary | ICD-10-CM | POA: Diagnosis not present

## 2021-05-28 DIAGNOSIS — Z9181 History of falling: Secondary | ICD-10-CM | POA: Diagnosis not present

## 2021-06-03 ENCOUNTER — Other Ambulatory Visit: Payer: Self-pay | Admitting: Internal Medicine

## 2021-06-03 DIAGNOSIS — R413 Other amnesia: Secondary | ICD-10-CM

## 2021-06-09 DIAGNOSIS — M1611 Unilateral primary osteoarthritis, right hip: Secondary | ICD-10-CM | POA: Diagnosis not present

## 2021-06-09 DIAGNOSIS — R2689 Other abnormalities of gait and mobility: Secondary | ICD-10-CM | POA: Diagnosis not present

## 2021-06-09 DIAGNOSIS — M63811 Disorders of muscle in diseases classified elsewhere, right shoulder: Secondary | ICD-10-CM | POA: Diagnosis not present

## 2021-06-09 DIAGNOSIS — M6389 Disorders of muscle in diseases classified elsewhere, multiple sites: Secondary | ICD-10-CM | POA: Diagnosis not present

## 2021-06-09 DIAGNOSIS — I5032 Chronic diastolic (congestive) heart failure: Secondary | ICD-10-CM | POA: Diagnosis not present

## 2021-06-09 DIAGNOSIS — M170 Bilateral primary osteoarthritis of knee: Secondary | ICD-10-CM | POA: Diagnosis not present

## 2021-06-09 DIAGNOSIS — R278 Other lack of coordination: Secondary | ICD-10-CM | POA: Diagnosis not present

## 2021-06-09 DIAGNOSIS — Z9181 History of falling: Secondary | ICD-10-CM | POA: Diagnosis not present

## 2021-06-09 DIAGNOSIS — I10 Essential (primary) hypertension: Secondary | ICD-10-CM | POA: Diagnosis not present

## 2021-06-09 LAB — BASIC METABOLIC PANEL
BUN: 29 — AB (ref 4–21)
CO2: 26 — AB (ref 13–22)
Chloride: 104 (ref 99–108)
Creatinine: 1 (ref 0.5–1.1)
Glucose: 129
Potassium: 4 mEq/L (ref 3.5–5.1)
Sodium: 141 (ref 137–147)

## 2021-06-09 LAB — COMPREHENSIVE METABOLIC PANEL: Calcium: 9.4 (ref 8.7–10.7)

## 2021-06-15 ENCOUNTER — Encounter: Payer: Medicare Other | Admitting: Adult Health

## 2021-06-17 ENCOUNTER — Encounter: Payer: Self-pay | Admitting: Internal Medicine

## 2021-06-17 ENCOUNTER — Non-Acute Institutional Stay: Payer: Medicare Other | Admitting: Internal Medicine

## 2021-06-17 VITALS — BP 124/84 | HR 92 | Temp 97.2°F | Ht 68.0 in

## 2021-06-17 DIAGNOSIS — M810 Age-related osteoporosis without current pathological fracture: Secondary | ICD-10-CM | POA: Diagnosis not present

## 2021-06-17 DIAGNOSIS — I5032 Chronic diastolic (congestive) heart failure: Secondary | ICD-10-CM | POA: Diagnosis not present

## 2021-06-17 DIAGNOSIS — N1831 Chronic kidney disease, stage 3a: Secondary | ICD-10-CM

## 2021-06-17 DIAGNOSIS — E7800 Pure hypercholesterolemia, unspecified: Secondary | ICD-10-CM | POA: Diagnosis not present

## 2021-06-17 DIAGNOSIS — E1122 Type 2 diabetes mellitus with diabetic chronic kidney disease: Secondary | ICD-10-CM

## 2021-06-17 DIAGNOSIS — N1832 Chronic kidney disease, stage 3b: Secondary | ICD-10-CM

## 2021-06-17 DIAGNOSIS — F330 Major depressive disorder, recurrent, mild: Secondary | ICD-10-CM

## 2021-06-17 DIAGNOSIS — J301 Allergic rhinitis due to pollen: Secondary | ICD-10-CM | POA: Diagnosis not present

## 2021-06-17 DIAGNOSIS — I10 Essential (primary) hypertension: Secondary | ICD-10-CM | POA: Diagnosis not present

## 2021-06-17 NOTE — Progress Notes (Signed)
Location: [facility] of Service:  Clinic (12)  Provider:   Code Status:  Goals of Care:     06/17/2021    8:40 AM  Advanced Directives  Does Patient Have a Medical Advance Directive? Yes  Type of Paramedic of [place];Living will  Does patient want to make changes to medical advance directive? No - Patient declined  Copy of [place] in Chart? Yes - validated most recent copy scanned in chart (See row information)     Chief Complaint  Patient presents with   Follow-up    Patient presents today for a 4 week follow up.   Quality Metric Gaps    Discuss the need for eye exam or post pone if patient refuses.     HPI: Patient is a 88 y.o. female seen today for an acute visit for Follow up of her Depression and BP  Started on Lexapro last visit and she had some diarrhea which she thinks was due to Lexapro She stopped it and does not want anything else  She denies depression though her children have noticed some. She is Eating well Sleeping well Participates in her activities. Weight is stable  BP was high Last visit but is better today BP readings in home are in good limits SBP 120-140  Allergic Rhinitis with Cough Also Noticed mild edema in both her legs with Chronic venous changes Her other issues  Has history of Alzheimer's dementia Doing well On Namenda and Aricept Has 24 7 caregivers Mostly uses wheelchair for Long distance can do her transfers but always have someone to help  h/o Diastolic CHF, HLD, Arthritis H/o Osteoporosis Cellulitis of Left Knee resolved now   Daughter Pharmacist and son is cardiologist  Past Medical History:  Diagnosis Date   Abscess of right leg 12/24/2020   Childhood asthma    Chronic diastolic heart failure ([place]) 12/25/2012   October 2013 echocardiogram 1. There is moderate concentric left ventricle hypertrophy. 2. Left ventricular ejection fraction estimated by  2D at 60-65 percent. 3. There were no regional wall motion abnormalities. 4. Mild mitral annular calcification. 5. Mild mitral valve regurgitation. 6. There is mild tricuspid regurgitation. 7. Mildly elevated estimated right ventricular systolic pressure. 8. Right ventricular systolic pressure estimated at 35-40 mm Hg. 9. Mild calcification of the aortic valve. 10. Trace aortic valve regurgitation. 11. Analysis of mitral valve inflow, pulmonary vein Doppler and tissue Doppler suggests grade I diastolic dysfunction without elevated left atrial pressure.    Colon polyp    Diverticular stricture ([place]) 2009   Diverticulosis 10/30/2007   DM (diabetes mellitus), type 2 ([place])    Edema 09/12/2013   Fall 05/18/2017   tripped over wheel of walker; superior and inferior pubic ramus fractures    Hypercholesterolemia    Hypertension    Hypertension, essential    Hypothyroidism    as a teenager   Indigestion    Insomnia    OA (osteoarthritis) of hip    right hip replacement, OA in bilateral knees   Osteopenia    Actonel stopped in 2012 after 5years therapy   Pneumonia 1939   "hospitalized"   Rapid resting heart rate 83/38/2505   Uncertain cause. Possible ectopic atrial tachycardia. Date entered 12/25/12    Right rotator cuff tear    Seasonal allergies     Past Surgical History:  Procedure Laterality Date   CATARACT EXTRACTION W/ INTRAOCULAR LENS  IMPLANT, BILATERAL Bilateral  COLONOSCOPY  2009   Dr. [facility]   DILATION AND CURETTAGE OF UTERUS     FRACTURE SURGERY     JOINT REPLACEMENT     ORIF WRIST FRACTURE Right    REDUCTION MAMMAPLASTY  1999   TOTAL HIP ARTHROPLASTY Right 03-04-08   Dr. Wynelle Link    Allergies  Allergen Reactions   Amlodipine Besylate Other (See Comments)    Edema   Lisinopril Cough    Cough    Sulfamethoxazole-Trimethoprim Other (See Comments)    Unknown reaction     Outpatient Encounter Medications as of 06/17/2021  Medication Sig   acetaminophen (TYLENOL)  500 MG tablet Take 500 mg by mouth every 6 (six) hours as needed for mild pain, headache or fever.   atorvastatin (LIPITOR) 40 MG tablet TAKE 1 TABLET ONCE DAILY.   Ca Phosphate-Cholecalciferol (CALTRATE GUMMY BITES PO) Take 1 tablet by mouth daily.   donepezil (ARICEPT) 10 MG tablet TAKE ONE TABLET BY MOUTH AT BEDTIME   escitalopram (LEXAPRO) 5 MG tablet Take 1 tablet (5 mg total) by mouth daily.   famotidine (PEPCID) 20 MG tablet Take 20 mg by mouth daily.   fexofenadine (ALLEGRA) 180 MG tablet Take 1 tablet (180 mg total) by mouth daily.   hydrochlorothiazide (HYDRODIURIL) 25 MG tablet TAKE 1 TABLET ONCE A DAY WITH LOSARTAN UNTIL COMBO TABLET BACK AVAILABLE. (Patient taking differently: Take 25 mg by mouth daily.)   losartan (COZAAR) 100 MG tablet TAKE 1 TABLET DAILY WITH HCTZ TIL COMBINATION PILL AVAILABLE. (Patient taking differently: Take 100 mg by mouth daily.)   memantine (NAMENDA) 10 MG tablet TAKE 1 TABLET BY MOUTH TWICE DAILY. (Patient taking differently: Take 10 mg by mouth 2 (two) times daily.)   metoprolol succinate (TOPROL-XL) 100 MG 24 hr tablet TAKE 1&1/2 TABLETS TWICE DAILY. (Patient taking differently: Take 150 mg by mouth 2 (two) times daily.)   vitamin B-12 (CYANOCOBALAMIN) 1000 MCG tablet Take 1 tablet (1,000 mcg total) by mouth daily.   No facility-administered encounter medications on file as of 06/17/2021.    Review of Systems:  Review of Systems  Constitutional:  Negative for activity change and appetite change.  HENT:  Positive for postnasal drip and rhinorrhea.   Respiratory:  Negative for cough and shortness of breath.   Cardiovascular:  Positive for leg swelling.  Gastrointestinal:  Negative for constipation.  Genitourinary: Negative.   Musculoskeletal:  Positive for gait problem. Negative for arthralgias and myalgias.  Skin: Negative.   Neurological:  Negative for dizziness and weakness.  Psychiatric/Behavioral:  Positive for confusion. Negative for dysphoric  mood and sleep disturbance.    Health Maintenance  Topic Date Due   OPHTHALMOLOGY EXAM  07/11/2020   Zoster Vaccines- Shingrix (2 of 2) 07/16/2021   INFLUENZA VACCINE  08/11/2021   HEMOGLOBIN A1C  09/26/2021   FOOT EXAM  04/02/2022   TETANUS/TDAP  07/21/2027   Pneumonia Vaccine 13+ Years old  Completed   DEXA SCAN  Completed   COVID-19 Vaccine  Completed   HPV VACCINES  Aged Out    Physical Exam: Vitals:   06/17/21 0905  BP: 124/84  Pulse: 92  Temp: (!) 97.2 F (36.2 C)  TempSrc: Temporal  SpO2: 94%  Height: '5\' 8"'$  (1.727 m)   Body mass index is 27.83 kg/m. Physical Exam Vitals reviewed.  Constitutional:      Appearance: Normal appearance.  HENT:     Head: Normocephalic.     Nose: Nose normal.     Mouth/Throat:  Mouth: Mucous membranes are moist.     Pharynx: Oropharynx is clear.  Eyes:     Pupils: Pupils are equal, round, and reactive to light.  Cardiovascular:     Rate and Rhythm: Normal rate and regular rhythm.     Pulses: Normal pulses.     Heart sounds: Normal heart sounds. No murmur heard. Pulmonary:     Effort: Pulmonary effort is normal.     Breath sounds: Normal breath sounds.  Abdominal:     General: Abdomen is flat. Bowel sounds are normal.     Palpations: Abdomen is soft.  Musculoskeletal:     Cervical back: Neck supple.     Comments: Has mild Swelling Bilateral with Chronic Venous changes  Skin:    General: Skin is warm.  Neurological:     General: No focal deficit present.     Mental Status: She is alert and oriented to person, place, and time.  Psychiatric:        Mood and Affect: Mood normal.        Thought Content: Thought content normal.    Labs reviewed: Basic Metabolic Panel: Recent Labs    07/17/20 0000 11/25/20 0000 12/10/20 0348 12/11/20 0331 12/24/20 1546 06/09/21 0000  NA 143   < > 137 138 143 141  K 3.8   < > 3.3* 3.9 4.5 4.0  CL 104   < > 105 109 106 104  CO2 26*   < > 24 21* 29 26*  GLUCOSE  --    < > 122*  111* 97  --   BUN 35*   < > 42* 36* 23 29*  CREATININE 1.4*   < > 1.22* 1.17* 1.10* 1.0  CALCIUM 9.6   < > 8.5* 8.3* 9.5 9.4  MG  --   --   --  1.8  --   --   TSH 0.98  --   --   --   --   --    < > = values in this interval not displayed.   Liver Function Tests: Recent Labs    07/17/20 0000 11/25/20 0000 11/26/20 0000 12/09/20 2040  AST 18  --  13 13*  ALT 14  --  10 8  ALKPHOS 96  --  75 88  BILITOT  --   --   --  1.1  PROT  --   --   --  6.9  ALBUMIN 4.2 3.5  --  4.0   No results for input(s): LIPASE, AMYLASE in the last 8760 hours. No results for input(s): AMMONIA in the last 8760 hours. CBC: Recent Labs    12/09/20 1858 12/10/20 0348 12/11/20 0331 12/24/20 1546 03/26/21 0000  WBC 10.4 10.1 8.9 8.4 7.9  NEUTROABS 7.2 7.0  --  5,998  --   HGB 12.6 12.2 11.4* 12.7 13.9  HCT 39.6 38.3 35.7* 39.0 44  MCV 95.2 98.0 97.5 94.9  --   PLT 264 209 195 223 178   Lipid Panel: Recent Labs    07/17/20 0000  CHOL 164  HDL 64  LDLCALC 72  TRIG 139   Lab Results  Component Value Date   HGBA1C 7.0 03/26/2021    Procedures since last visit: No results found.  Assessment/Plan 1. Mild episode of recurrent major depressive disorder ([place]) Doing well  Does not want to take any meds right now Will continue to monitor Her caregivers will also let us know if she shows any changes otherwise will  review next visit  2. Essential hypertension BP looking good Continue HCTZ and Losartan and Metoprolol   3. Seasonal allergic rhinitis due to pollen Continue Flonase and Allegra  4. Type 2 diabetes mellitus with stage 3b chronic kidney disease, without long-term current use of insulin (HCC) A1C good limit On Metformin Plans to get EYE exam in next few months  5. Stage 3a chronic kidney disease (HCC) Creat stable  6. Chronic diastolic heart failure (HCC) On Low dose of HCTZ Elevate legs when she is sitting at home  7. Age-related osteoporosis without current  pathological fracture Has been on Fosamax since 2017 off and on. Consistently since 2020 D/w POA Will discontinue right now They Donot want DEXA as she is not ambulatory  8. Hypercholesterolemia On statin Will Need Follow up of Her lipid     Labs/tests ordered:  CBC,BMP,Lipid Panel ,A1C Next appt:  Visit date not found

## 2021-06-23 DIAGNOSIS — R278 Other lack of coordination: Secondary | ICD-10-CM | POA: Diagnosis not present

## 2021-06-23 DIAGNOSIS — M6389 Disorders of muscle in diseases classified elsewhere, multiple sites: Secondary | ICD-10-CM | POA: Diagnosis not present

## 2021-06-23 DIAGNOSIS — I5032 Chronic diastolic (congestive) heart failure: Secondary | ICD-10-CM | POA: Diagnosis not present

## 2021-06-23 DIAGNOSIS — R2689 Other abnormalities of gait and mobility: Secondary | ICD-10-CM | POA: Diagnosis not present

## 2021-06-23 DIAGNOSIS — M63811 Disorders of muscle in diseases classified elsewhere, right shoulder: Secondary | ICD-10-CM | POA: Diagnosis not present

## 2021-06-23 DIAGNOSIS — Z9181 History of falling: Secondary | ICD-10-CM | POA: Diagnosis not present

## 2021-06-23 DIAGNOSIS — M1611 Unilateral primary osteoarthritis, right hip: Secondary | ICD-10-CM | POA: Diagnosis not present

## 2021-06-23 DIAGNOSIS — M170 Bilateral primary osteoarthritis of knee: Secondary | ICD-10-CM | POA: Diagnosis not present

## 2021-07-20 ENCOUNTER — Other Ambulatory Visit: Payer: Self-pay | Admitting: Interventional Cardiology

## 2021-07-20 ENCOUNTER — Other Ambulatory Visit: Payer: Self-pay | Admitting: Internal Medicine

## 2021-07-27 ENCOUNTER — Encounter: Payer: Medicare Other | Admitting: Adult Health

## 2021-08-17 ENCOUNTER — Other Ambulatory Visit: Payer: Self-pay | Admitting: Interventional Cardiology

## 2021-08-27 ENCOUNTER — Other Ambulatory Visit: Payer: Self-pay | Admitting: Internal Medicine

## 2021-08-31 ENCOUNTER — Other Ambulatory Visit: Payer: Self-pay | Admitting: Interventional Cardiology

## 2021-09-04 ENCOUNTER — Telehealth: Payer: Self-pay | Admitting: Adult Health

## 2021-09-04 DIAGNOSIS — R051 Acute cough: Secondary | ICD-10-CM

## 2021-09-04 MED ORDER — GUAIFENESIN 100 MG/5ML PO LIQD: 15.0000 mL | Freq: Four times a day (QID) | ORAL | 0 refills | Status: DC | PRN

## 2021-09-04 NOTE — Telephone Encounter (Signed)
Clinic nurse Autumn reports that Kelsey Salinas has a cough. It is chronic but is bothersome to her and she is requesting cough syrup. She does not have a fever, sorethroat, sob, etc. If worsening or not improvement advise to make an apt.

## 2021-09-07 ENCOUNTER — Telehealth: Payer: Self-pay | Admitting: Interventional Cardiology

## 2021-09-07 ENCOUNTER — Other Ambulatory Visit: Payer: Self-pay | Admitting: Interventional Cardiology

## 2021-09-07 NOTE — Telephone Encounter (Signed)
*  STAT* If patient is at the pharmacy, call can be transferred to refill team.   1. Which medications need to be refilled? (please list name of each medication and dose if known)   metoprolol succinate (TOPROL-XL) 100 MG 24 hr tablet    2. Which pharmacy/location (including street and city if local pharmacy) is medication to be sent to? [place], [place] [address]  3. Do they need a 30 day or 90 day supply? 53   Pt would also like a call back to discuss how she can have an appt with Dr. Tamala Julian at [facility]. Her assisted living facility. She states she is not able to come in for appt, but needs this refill. Please advise.

## 2021-09-08 NOTE — Telephone Encounter (Signed)
Returned call to pt.  She found her refill of Metoprolol that was sent in earlier in the month.  She has been advised that Dr. Tamala Julian nor any of our Cardiologists comes to [facility] to see patients.  She was thankful for the call back.

## 2021-09-09 ENCOUNTER — Emergency Department (HOSPITAL_COMMUNITY): Payer: Medicare Other

## 2021-09-09 ENCOUNTER — Other Ambulatory Visit: Payer: Self-pay

## 2021-09-09 ENCOUNTER — Encounter: Payer: Medicare Other | Admitting: Internal Medicine

## 2021-09-09 ENCOUNTER — Inpatient Hospital Stay (HOSPITAL_COMMUNITY)
Admission: EM | Admit: 2021-09-09 | Discharge: 2021-09-11 | DRG: 291 | Disposition: A | Discharge status: Skilled Nursing Facility | Payer: Medicare Other | Source: Skilled Nursing Facility | Attending: Internal Medicine | Admitting: Internal Medicine

## 2021-09-09 ENCOUNTER — Encounter (HOSPITAL_COMMUNITY): Payer: Self-pay

## 2021-09-09 DIAGNOSIS — Z7983 Long term (current) use of bisphosphonates: Secondary | ICD-10-CM

## 2021-09-09 DIAGNOSIS — N183 Chronic kidney disease, stage 3 unspecified: Secondary | ICD-10-CM | POA: Diagnosis not present

## 2021-09-09 DIAGNOSIS — I5031 Acute diastolic (congestive) heart failure: Secondary | ICD-10-CM | POA: Diagnosis present

## 2021-09-09 DIAGNOSIS — I4719 Other supraventricular tachycardia: Secondary | ICD-10-CM

## 2021-09-09 DIAGNOSIS — R54 Age-related physical debility: Secondary | ICD-10-CM | POA: Diagnosis present

## 2021-09-09 DIAGNOSIS — I130 Hypertensive heart and chronic kidney disease with heart failure and stage 1 through stage 4 chronic kidney disease, or unspecified chronic kidney disease: Principal | ICD-10-CM | POA: Diagnosis present

## 2021-09-09 DIAGNOSIS — E1122 Type 2 diabetes mellitus with diabetic chronic kidney disease: Secondary | ICD-10-CM | POA: Diagnosis present

## 2021-09-09 DIAGNOSIS — I10 Essential (primary) hypertension: Secondary | ICD-10-CM | POA: Diagnosis present

## 2021-09-09 DIAGNOSIS — Z888 Allergy status to other drugs, medicaments and biological substances status: Secondary | ICD-10-CM | POA: Diagnosis not present

## 2021-09-09 DIAGNOSIS — J9811 Atelectasis: Secondary | ICD-10-CM | POA: Diagnosis present

## 2021-09-09 DIAGNOSIS — R413 Other amnesia: Secondary | ICD-10-CM | POA: Diagnosis present

## 2021-09-09 DIAGNOSIS — Z882 Allergy status to sulfonamides status: Secondary | ICD-10-CM

## 2021-09-09 DIAGNOSIS — Z87891 Personal history of nicotine dependence: Secondary | ICD-10-CM | POA: Diagnosis not present

## 2021-09-09 DIAGNOSIS — E7800 Pure hypercholesterolemia, unspecified: Secondary | ICD-10-CM | POA: Diagnosis present

## 2021-09-09 DIAGNOSIS — I5043 Acute on chronic combined systolic (congestive) and diastolic (congestive) heart failure: Secondary | ICD-10-CM | POA: Diagnosis not present

## 2021-09-09 DIAGNOSIS — R051 Acute cough: Secondary | ICD-10-CM | POA: Diagnosis not present

## 2021-09-09 DIAGNOSIS — I110 Hypertensive heart disease with heart failure: Secondary | ICD-10-CM | POA: Diagnosis not present

## 2021-09-09 DIAGNOSIS — E876 Hypokalemia: Secondary | ICD-10-CM | POA: Diagnosis not present

## 2021-09-09 DIAGNOSIS — Z20822 Contact with and (suspected) exposure to covid-19: Secondary | ICD-10-CM | POA: Diagnosis present

## 2021-09-09 DIAGNOSIS — M170 Bilateral primary osteoarthritis of knee: Secondary | ICD-10-CM | POA: Diagnosis present

## 2021-09-09 DIAGNOSIS — Z79899 Other long term (current) drug therapy: Secondary | ICD-10-CM | POA: Diagnosis not present

## 2021-09-09 DIAGNOSIS — J069 Acute upper respiratory infection, unspecified: Secondary | ICD-10-CM | POA: Diagnosis present

## 2021-09-09 DIAGNOSIS — M8580 Other specified disorders of bone density and structure, unspecified site: Secondary | ICD-10-CM | POA: Diagnosis present

## 2021-09-09 DIAGNOSIS — R0902 Hypoxemia: Secondary | ICD-10-CM | POA: Diagnosis not present

## 2021-09-09 DIAGNOSIS — I5189 Other ill-defined heart diseases: Secondary | ICD-10-CM | POA: Diagnosis not present

## 2021-09-09 DIAGNOSIS — I471 Supraventricular tachycardia: Secondary | ICD-10-CM | POA: Diagnosis not present

## 2021-09-09 DIAGNOSIS — Z96641 Presence of right artificial hip joint: Secondary | ICD-10-CM | POA: Diagnosis present

## 2021-09-09 DIAGNOSIS — I5033 Acute on chronic diastolic (congestive) heart failure: Secondary | ICD-10-CM | POA: Diagnosis not present

## 2021-09-09 DIAGNOSIS — Z66 Do not resuscitate: Secondary | ICD-10-CM | POA: Diagnosis present

## 2021-09-09 DIAGNOSIS — R0602 Shortness of breath: Secondary | ICD-10-CM | POA: Diagnosis not present

## 2021-09-09 DIAGNOSIS — J439 Emphysema, unspecified: Secondary | ICD-10-CM | POA: Diagnosis present

## 2021-09-09 DIAGNOSIS — I5032 Chronic diastolic (congestive) heart failure: Secondary | ICD-10-CM | POA: Diagnosis not present

## 2021-09-09 DIAGNOSIS — N1832 Chronic kidney disease, stage 3b: Secondary | ICD-10-CM | POA: Diagnosis present

## 2021-09-09 DIAGNOSIS — R000 Tachycardia, unspecified: Secondary | ICD-10-CM | POA: Diagnosis present

## 2021-09-09 LAB — CBC
HCT: 42.2 % (ref 36.0–46.0)
Hemoglobin: 13.7 g/dL (ref 12.0–15.0)
MCH: 30.4 pg (ref 26.0–34.0)
MCHC: 32.5 g/dL (ref 30.0–36.0)
MCV: 93.8 fL (ref 80.0–100.0)
Platelets: 253 10*3/uL (ref 150–400)
RBC: 4.5 MIL/uL (ref 3.87–5.11)
RDW: 13.2 % (ref 11.5–15.5)
WBC: 9 10*3/uL (ref 4.0–10.5)
nRBC: 0 % (ref 0.0–0.2)

## 2021-09-09 LAB — TROPONIN I (HIGH SENSITIVITY)
Troponin I (High Sensitivity): 21 ng/L — ABNORMAL HIGH (ref <18)
Troponin I (High Sensitivity): 19 ng/L — ABNORMAL HIGH (ref <18)

## 2021-09-09 LAB — BASIC METABOLIC PANEL
Anion gap: 13 (ref 5–15)
BUN: 24 mg/dL — ABNORMAL HIGH (ref 8–23)
CO2: 24 mmol/L (ref 22–32)
Calcium: 9.3 mg/dL (ref 8.9–10.3)
Chloride: 103 mmol/L (ref 98–111)
Creatinine, Ser: 1.31 mg/dL — ABNORMAL HIGH (ref 0.44–1.00)
GFR, Estimated: 38 mL/min — ABNORMAL LOW (ref >60)
Glucose, Bld: 105 mg/dL — ABNORMAL HIGH (ref 70–99)
Potassium: 4.4 mmol/L (ref 3.5–5.1)
Sodium: 140 mmol/L (ref 135–145)

## 2021-09-09 LAB — BRAIN NATRIURETIC PEPTIDE: B Natriuretic Peptide: 349.3 pg/mL — ABNORMAL HIGH (ref 0.0–100.0)

## 2021-09-09 LAB — D-DIMER, QUANTITATIVE: D-Dimer, Quant: 1.45 ug/mL-FEU — ABNORMAL HIGH (ref 0.00–0.50)

## 2021-09-09 LAB — SARS CORONAVIRUS 2 BY RT PCR: SARS Coronavirus 2 by RT PCR: NEGATIVE

## 2021-09-09 MED ORDER — POLYETHYLENE GLYCOL 3350 17 G PO PACK
17.0000 g | PACK | Freq: Every day | ORAL | Status: DC
  Filled 2021-09-09 (×2): qty 1

## 2021-09-09 MED ORDER — HYDROCHLOROTHIAZIDE 25 MG PO TABS: 25.0000 mg | ORAL_TABLET | Freq: Every day | ORAL | Status: DC

## 2021-09-09 MED ORDER — SODIUM CHLORIDE 0.9 % IV SOLN: 250.0000 mL | INTRAVENOUS | Status: DC | PRN

## 2021-09-09 MED ORDER — VITAMIN B-12 1000 MCG PO TABS
1000.0000 ug | ORAL_TABLET | Freq: Every day | ORAL | Status: DC
  Administered 2021-09-10 – 2021-09-11 (×2): 1000 ug via ORAL
  Filled 2021-09-09 (×2): qty 1

## 2021-09-09 MED ORDER — TRIAMCINOLONE ACETONIDE 55 MCG/ACT NA AERO
2.0000 | INHALATION_SPRAY | Freq: Every day | NASAL | Status: DC
Start: 2021-09-10 — End: 2021-09-11
  Administered 2021-09-11: 2 via NASAL
  Filled 2021-09-09: qty 10.8
  Filled 2021-09-09: qty 21.6

## 2021-09-09 MED ORDER — LOSARTAN POTASSIUM 50 MG PO TABS
100.0000 mg | ORAL_TABLET | Freq: Every day | ORAL | Status: DC
Start: 2021-09-10 — End: 2021-09-10
  Administered 2021-09-10: 100 mg via ORAL
  Filled 2021-09-09: qty 2

## 2021-09-09 MED ORDER — METOPROLOL SUCCINATE ER 50 MG PO TB24
150.0000 mg | ORAL_TABLET | Freq: Two times a day (BID) | ORAL | Status: DC
  Administered 2021-09-09 – 2021-09-11 (×4): 150 mg via ORAL
  Filled 2021-09-09 (×4): qty 1

## 2021-09-09 MED ORDER — DONEPEZIL HCL 10 MG PO TABS
10.0000 mg | ORAL_TABLET | Freq: Every day | ORAL | Status: DC
  Administered 2021-09-09 – 2021-09-10 (×2): 10 mg via ORAL
  Filled 2021-09-09 (×2): qty 1

## 2021-09-09 MED ORDER — ENOXAPARIN SODIUM 40 MG/0.4ML IJ SOSY
40.0000 mg | PREFILLED_SYRINGE | INTRAMUSCULAR | Status: DC
  Administered 2021-09-10: 40 mg via SUBCUTANEOUS
  Filled 2021-09-09 (×2): qty 0.4

## 2021-09-09 MED ORDER — ONDANSETRON HCL 4 MG/2ML IJ SOLN: 4.0000 mg | Freq: Four times a day (QID) | INTRAMUSCULAR | Status: DC | PRN

## 2021-09-09 MED ORDER — SODIUM CHLORIDE 0.9% FLUSH: 3.0000 mL | INTRAVENOUS | Status: DC | PRN

## 2021-09-09 MED ORDER — SODIUM CHLORIDE 0.9% FLUSH
3.0000 mL | Freq: Two times a day (BID) | INTRAVENOUS | Status: DC
  Administered 2021-09-09 – 2021-09-10 (×3): 3 mL via INTRAVENOUS

## 2021-09-09 MED ORDER — SALINE SPRAY 0.65 % NA SOLN
1.0000 | NASAL | Status: DC | PRN
Start: 2021-09-10 — End: 2021-09-11

## 2021-09-09 MED ORDER — IOHEXOL 350 MG/ML SOLN
60.0000 mL | Freq: Once | INTRAVENOUS | Status: AC | PRN
  Administered 2021-09-09: 60 mL via INTRAVENOUS

## 2021-09-09 MED ORDER — ACETAMINOPHEN 325 MG PO TABS: 650.0000 mg | ORAL_TABLET | ORAL | Status: DC | PRN

## 2021-09-09 MED ORDER — FUROSEMIDE 10 MG/ML IJ SOLN
20.0000 mg | Freq: Every day | INTRAMUSCULAR | Status: DC
Start: 2021-09-09 — End: 2021-09-10
  Administered 2021-09-09 – 2021-09-10 (×2): 20 mg via INTRAVENOUS
  Filled 2021-09-09 (×2): qty 2

## 2021-09-09 MED ORDER — DICLOFENAC SODIUM 1 % EX GEL
2.0000 g | Freq: Four times a day (QID) | CUTANEOUS | Status: DC
  Administered 2021-09-10: 2 g via TOPICAL
  Filled 2021-09-09: qty 100

## 2021-09-09 MED ORDER — ESCITALOPRAM OXALATE 10 MG PO TABS
5.0000 mg | ORAL_TABLET | Freq: Every day | ORAL | Status: DC
  Administered 2021-09-10 – 2021-09-11 (×2): 5 mg via ORAL
  Filled 2021-09-09 (×2): qty 1

## 2021-09-09 MED ORDER — FAMOTIDINE 20 MG PO TABS
20.0000 mg | ORAL_TABLET | Freq: Every day | ORAL | Status: DC
  Administered 2021-09-10 – 2021-09-11 (×2): 20 mg via ORAL
  Filled 2021-09-09 (×2): qty 1

## 2021-09-09 MED ORDER — ATORVASTATIN CALCIUM 40 MG PO TABS
40.0000 mg | ORAL_TABLET | Freq: Every day | ORAL | Status: DC
  Administered 2021-09-10 – 2021-09-11 (×2): 40 mg via ORAL
  Filled 2021-09-09 (×2): qty 1

## 2021-09-09 MED ORDER — LORATADINE 10 MG PO TABS
10.0000 mg | ORAL_TABLET | Freq: Every day | ORAL | Status: DC
  Administered 2021-09-10 – 2021-09-11 (×2): 10 mg via ORAL
  Filled 2021-09-09 (×2): qty 1

## 2021-09-09 MED ORDER — FUROSEMIDE 10 MG/ML IJ SOLN
20.0000 mg | Freq: Once | INTRAMUSCULAR | Status: AC
  Administered 2021-09-09: 20 mg via INTRAVENOUS
  Filled 2021-09-09: qty 2

## 2021-09-09 MED ORDER — MEMANTINE HCL 10 MG PO TABS
10.0000 mg | ORAL_TABLET | Freq: Two times a day (BID) | ORAL | Status: DC
  Administered 2021-09-09 – 2021-09-11 (×4): 10 mg via ORAL
  Filled 2021-09-09 (×4): qty 1

## 2021-09-09 NOTE — Assessment & Plan Note (Signed)
Creat 1.3 today appears near baseline Monitor closely with diuresis.

## 2021-09-09 NOTE — Assessment & Plan Note (Addendum)
Patient has acute decompensated CHF: Patient presents with: nocturnal cough and dyspnea on exertion / increased shortness of breath Exam findings include: tachycardia Work up findings include: PULMONARY EDEMA ON CXR Pulm edema on CT scan BNP elevation. New O2 requirement Covid neg, WBC nl. EKG: sinus but HR is randomly tachycardic then normal: ? SSS / Tachybrady? 1. CHF pathway 2. Gentle diuresis with lasix 3. Continue HR control with metoprolol 4. Tele monitor 5. 2d echo 6. Daily BMP 7. Cards consult in AM for that very abnormal HR that is intermittently tachy vs normal 1. Will leave BB dosing alone for the moment since it looks like shes more tachycardic, not really bradycardic / having syncope or anything. 2. Ill just defer any changes in BB dosing to cards.

## 2021-09-09 NOTE — ED Provider Notes (Signed)
Care of the patient assumed at signout.  On result availability of all studies I discussed all findings with the patient and her daughter.  Findings was consistent with heart failure exacerbation with oxygen requirement.  No evidence for PE, pneumonia, patient has no ongoing chest pain, low suspicion for ACS.  Patient will start IV Lasix, be admitted for further monitoring, management.   Carmin Muskrat, MD 09/09/21 2052

## 2021-09-09 NOTE — ED Provider Notes (Signed)
[facility] EMERGENCY DEPARTMENT Provider Note   CSN: [id number] Arrival date & time: 09/09/21  1316     History {Add pertinent medical, surgical, social history, OB history to HPI:1} Chief Complaint  Patient presents with   Tachycardia    Kelsey Salinas is a 88 y.o. female.  HPI Patient presents from [facility].  Reportedly has had a cough for last couple days.  Nurse checked her vitals and found sats of 88% on room air.  Reportedly irregular and fast heart rate.  Reportedly was told he could not get an EKG to know if she was in A-fib or not and would need to come to the hospital.  Patient has had a cough and feels a little fatigued but is notably not all that happy about being here.  Mild sputum production.  No fevers.  No swelling in her legs.  No chest pain.  Patient's family ember states that they put a watch on her that showed a fast heart rate but did not actually trigger as A-fib.   Past Medical History:  Diagnosis Date   Abscess of right leg 12/24/2020   Childhood asthma    Chronic diastolic heart failure ([place]) 12/25/2012   October 2013 echocardiogram 1. There is moderate concentric left ventricle hypertrophy. 2. Left ventricular ejection fraction estimated by 2D at 60-65 percent. 3. There were no regional wall motion abnormalities. 4. Mild mitral annular calcification. 5. Mild mitral valve regurgitation. 6. There is mild tricuspid regurgitation. 7. Mildly elevated estimated right ventricular systolic pressure. 8. Right ventricular systolic pressure estimated at 35-40 mm Hg. 9. Mild calcification of the aortic valve. 10. Trace aortic valve regurgitation. 11. Analysis of mitral valve inflow, pulmonary vein Doppler and tissue Doppler suggests grade I diastolic dysfunction without elevated left atrial pressure.    Colon polyp    Diverticular stricture ([place]) 2009   Diverticulosis 10/30/2007   DM (diabetes mellitus), type 2 ([place])    Edema 09/12/2013   Fall  05/18/2017   tripped over wheel of walker; superior and inferior pubic ramus fractures    Hypercholesterolemia    Hypertension    Hypertension, essential    Hypothyroidism    as a teenager   Indigestion    Insomnia    OA (osteoarthritis) of hip    right hip replacement, OA in bilateral knees   Osteopenia    Actonel stopped in 2012 after 5years therapy   Pneumonia 1939   "hospitalized"   Rapid resting heart rate 19/50/9326   Uncertain cause. Possible ectopic atrial tachycardia. Date entered 12/25/12    Right rotator cuff tear    Seasonal allergies     Home Medications Prior to Admission medications   Medication Sig Start Date End Date Taking? Authorizing Provider  acetaminophen (TYLENOL) 500 MG tablet Take 500 mg by mouth every 6 (six) hours as needed for mild pain, headache or fever.    [provider]  atorvastatin (LIPITOR) 40 MG tablet TAKE 1 TABLET ONCE DAILY. 02/13/21   Virgie Dad, MD  Ca Phosphate-Cholecalciferol (CALTRATE GUMMY BITES PO) Take 1 tablet by mouth daily.    [provider]  donepezil (ARICEPT) 10 MG tablet TAKE ONE TABLET BY MOUTH AT BEDTIME 06/03/21   Virgie Dad, MD  famotidine (PEPCID) 20 MG tablet Take 20 mg by mouth daily.    [provider]  fexofenadine (ALLEGRA) 180 MG tablet Take 1 tablet (180 mg total) by mouth daily. 06/02/20   Royal Hawthorn, NP  guaiFENesin (ROBITUSSIN)  100 MG/5ML liquid Take 15 mLs by mouth every 6 (six) hours as needed for cough or to loosen phlegm. 09/04/21   Royal Hawthorn, NP  hydrochlorothiazide (HYDRODIURIL) 25 MG tablet TAKE 1 TABLET ONCE A DAY WITH LOSARTAN UNTIL COMBO TABLET BACK AVAILABLE. 07/20/21   Virgie Dad, MD  losartan (COZAAR) 100 MG tablet TAKE 1 TABLET DAILY WITH HCTZ TIL COMBINATION PILL AVAILABLE. 07/20/21   Virgie Dad, MD  memantine (NAMENDA) 10 MG tablet TAKE 1 TABLET BY MOUTH TWICE DAILY. Patient taking differently: Take 10 mg by mouth 2 (two) times daily. 09/25/20    Virgie Dad, MD  metoprolol succinate (TOPROL-XL) 100 MG 24 hr tablet TAKE 1&1/2 TABLETS TWICE DAILY. 08/19/21   Belva Crome, MD  vitamin B-12 (CYANOCOBALAMIN) 1000 MCG tablet Take 1 tablet (1,000 mcg total) by mouth daily. 12/16/17   Reed, Tiffany L, DO      Allergies    Amlodipine besylate, Lisinopril, and Sulfamethoxazole-trimethoprim    Review of Systems   Review of Systems  Constitutional:  Negative for appetite change.  Respiratory:  Positive for cough. Negative for shortness of breath.   Cardiovascular:  Negative for chest pain and leg swelling.  Neurological:  Negative for weakness.    Physical Exam Updated Vital Signs BP (!) 155/115   Pulse 96   Temp 99.4 F (37.4 C) (Oral)   Resp 18   SpO2 92%  Physical Exam Vitals and nursing note reviewed.  Eyes:     General:        Right eye: No discharge.  Cardiovascular:     Rate and Rhythm: Regular rhythm.  Pulmonary:     Breath sounds: No wheezing or rhonchi.  Abdominal:     Tenderness: There is no abdominal tenderness.  Musculoskeletal:     Cervical back: Neck supple.     Comments: Trace edema left lower extremity.  Skin:    General: Skin is warm.  Neurological:     Mental Status: She is alert and oriented to person, place, and time.     ED Results / Procedures / Treatments   Labs (all labs ordered are listed, but only abnormal results are displayed) Labs Reviewed  SARS CORONAVIRUS 2 BY RT PCR  D-DIMER, QUANTITATIVE  CBC  BASIC METABOLIC PANEL  BRAIN NATRIURETIC PEPTIDE  TROPONIN I (HIGH SENSITIVITY)    EKG EKG Interpretation  Date/Time:  Wednesday September 09 2021 13:21:07 EDT Ventricular Rate:  68 PR Interval:  158 QRS Duration: 80 QT Interval:  378 QTC Calculation: 401 R Axis:   5 Text Interpretation: Normal sinus rhythm Normal ECG When compared with ECG of 09-Dec-2020 19:20, Confirmed by Davonna Belling [phone]) on 09/09/2021 2:13:22 PM  Radiology DG Chest Portable 1 View  Result Date:  09/09/2021 CLINICAL DATA:  Shortness of breath EXAM: PORTABLE CHEST 1 VIEW COMPARISON:  12/09/2020 FINDINGS: Poor inspiration. Cardiomegaly. Aortic atherosclerosis and tortuosity. Increased interstitial markings that could represent early interstitial edema. No consolidation or lobar collapse. No effusion. IMPRESSION: Cardiomegaly and aortic atherosclerosis. Possible early interstitial edema. Electronically Signed   By: Nelson Chimes M.D.   On: 09/09/2021 15:12    Procedures Procedures  {Document cardiac monitor, telemetry assessment procedure when appropriate:1}  Medications Ordered in ED Medications - No data to display  ED Course/ Medical Decision Making/ A&P                           Medical Decision Making Amount and/or Complexity  of Data Reviewed Labs: ordered. Radiology: ordered.   Patient with hypoxia.  Sats down to 88%.  At rest sats are in the low 90s and with transferring the bed dropped into the 80s.  Lungs overall rather clear.  No fevers.  Is tachycardic at times but appears sinus.  Does not appear significantly volume overloaded.  We will check basic blood work to evaluate for causes of tachycardia such as anemia.  We will also get COVID testing and INR to evaluate for pulmonary embolism.  {Document critical care time when appropriate:1} {Document review of labs and clinical decision tools ie heart score, Chads2Vasc2 etc:1}  {Document your independent review of radiology images, and any outside records:1} {Document your discussion with family members, caretakers, and with consultants:1} {Document social determinants of health affecting pt's care:1} {Document your decision making why or why not admission, treatments were needed:1} Final Clinical Impression(s) / ED Diagnoses Final diagnoses:  None    Rx / DC Orders ED Discharge Orders     None

## 2021-09-09 NOTE — Assessment & Plan Note (Signed)
Cont aricept + namenda High risk for delirium with hospitalization.

## 2021-09-09 NOTE — ED Triage Notes (Signed)
Pt arrived POV from [facility] stating that the nurse checked her vitals this morning that her oxygen saturation was 88% on RA and and irregular heartbeat. Per pt's daughter the nurse called her and told her she might be in A-fib that she needed to come take her to the ED because they do not take them to the hospital.

## 2021-09-09 NOTE — H&P (Signed)
History and Physical    Patient: Kelsey Salinas LOV:[id number] DOB: 12-31-1926 DOA: 09/09/2021 DOS: the patient was seen and examined on 09/09/2021 PCP: Virgie Dad, MD  Patient coming from: ALF/ILF  Chief Complaint:  Chief Complaint  Patient presents with   Tachycardia   HPI: Kelsey Salinas is a 88 y.o. female with medical history significant of HTN, CKD 3, dCHF with grade 1 DD in 2013,.  Pt presents to ED with c/o gradual onset increased cough and SOB over the past few months.  DOE.  No orthopnea.  No fever.  No peripheral edema.  Symptoms constant, worse with exertion.  Started on cough syrup 5 days ago without much improvement.  Nurse at facility noted irregular HR with tachycardia at times, suggested A.Fib as possibility, pt in to ED.  In to ED.    Review of Systems: As mentioned in the history of present illness. All other systems reviewed and are negative. Past Medical History:  Diagnosis Date   Abscess of right leg 12/24/2020   Childhood asthma    Chronic diastolic heart failure ([place]) 12/25/2012   October 2013 echocardiogram 1. There is moderate concentric left ventricle hypertrophy. 2. Left ventricular ejection fraction estimated by 2D at 60-65 percent. 3. There were no regional wall motion abnormalities. 4. Mild mitral annular calcification. 5. Mild mitral valve regurgitation. 6. There is mild tricuspid regurgitation. 7. Mildly elevated estimated right ventricular systolic pressure. 8. Right ventricular systolic pressure estimated at 35-40 mm Hg. 9. Mild calcification of the aortic valve. 10. Trace aortic valve regurgitation. 11. Analysis of mitral valve inflow, pulmonary vein Doppler and tissue Doppler suggests grade I diastolic dysfunction without elevated left atrial pressure.    Colon polyp    Diverticular stricture ([place]) 2009   Diverticulosis 10/30/2007   DM (diabetes mellitus), type 2 ([place])    Edema 09/12/2013   Fall 05/18/2017   tripped over wheel of walker;  superior and inferior pubic ramus fractures    Hypercholesterolemia    Hypertension    Hypertension, essential    Hypothyroidism    as a teenager   Indigestion    Insomnia    OA (osteoarthritis) of hip    right hip replacement, OA in bilateral knees   Osteopenia    Actonel stopped in 2012 after 5years therapy   Pneumonia 1939   "hospitalized"   Rapid resting heart rate 88/41/6606   Uncertain cause. Possible ectopic atrial tachycardia. Date entered 12/25/12    Right rotator cuff tear    Seasonal allergies    Past Surgical History:  Procedure Laterality Date   CATARACT EXTRACTION W/ INTRAOCULAR LENS  IMPLANT, BILATERAL Bilateral    COLONOSCOPY  2009   Dr. [facility] [employer]   DILATION AND CURETTAGE OF UTERUS     FRACTURE SURGERY     JOINT REPLACEMENT     ORIF WRIST FRACTURE Right    REDUCTION MAMMAPLASTY  1999   TOTAL HIP ARTHROPLASTY Right 03-04-08   Dr. Wynelle Link   Social History:  reports that she has quit smoking. Her smoking use included cigarettes. She has a 6.00 pack-year smoking history. She has never used smokeless tobacco. She reports current alcohol use. She reports that she does not use drugs.  Allergies  Allergen Reactions   Amlodipine Besylate Other (See Comments)    Edema   Lisinopril Cough    Cough    Sulfamethoxazole-Trimethoprim Other (See Comments)    Unknown reaction     Family History  Problem Relation Age of Onset  Heart disease Mother    Heart disease Father    Heart disease Brother     Prior to Admission medications   Medication Sig Start Date End Date Taking? Authorizing Provider  acetaminophen (TYLENOL) 500 MG tablet Take 500 mg by mouth every 6 (six) hours as needed for mild pain, headache or fever.   Yes [provider]  alendronate (FOSAMAX) 70 MG tablet Take 70 mg by mouth once a week. Take with a full glass of water on an empty stomach.   Yes [provider]  atorvastatin (LIPITOR) 40 MG tablet TAKE 1 TABLET ONCE  DAILY. Patient taking differently: Take 40 mg by mouth daily. 02/13/21  Yes Virgie Dad, MD  diclofenac Sodium (VOLTAREN) 1 % GEL Apply 2 g topically 4 (four) times daily.   Yes [provider]  donepezil (ARICEPT) 10 MG tablet TAKE ONE TABLET BY MOUTH AT BEDTIME Patient taking differently: Take 10 mg by mouth at bedtime. 06/03/21  Yes Virgie Dad, MD  escitalopram (LEXAPRO) 5 MG tablet Take 5 mg by mouth daily.   Yes [provider]  famotidine (PEPCID) 20 MG tablet Take 20 mg by mouth daily.   Yes [provider]  fexofenadine (ALLEGRA) 180 MG tablet Take 1 tablet (180 mg total) by mouth daily. 06/02/20  Yes Wert, Margreta Journey, NP  hydrochlorothiazide (HYDRODIURIL) 25 MG tablet TAKE 1 TABLET ONCE A DAY WITH LOSARTAN UNTIL COMBO TABLET BACK AVAILABLE. Patient taking differently: Take 25 mg by mouth daily. Take with Losartan 07/20/21  Yes Virgie Dad, MD  losartan (COZAAR) 100 MG tablet TAKE 1 TABLET DAILY WITH HCTZ TIL COMBINATION PILL AVAILABLE. Patient taking differently: Take 100 mg by mouth daily. Take with HCTZ 07/20/21  Yes Virgie Dad, MD  memantine (NAMENDA) 10 MG tablet TAKE 1 TABLET BY MOUTH TWICE DAILY. Patient taking differently: Take 10 mg by mouth 2 (two) times daily. 09/25/20  Yes Virgie Dad, MD  metoprolol succinate (TOPROL-XL) 100 MG 24 hr tablet TAKE 1&1/2 TABLETS TWICE DAILY. Patient taking differently: Take 150 mg by mouth 2 (two) times daily. 08/19/21  Yes Belva Crome, MD  polyethylene glycol (MIRALAX / GLYCOLAX) 17 g packet Take 17 g by mouth daily.   Yes [provider]  sodium chloride (OCEAN) 0.65 % SOLN nasal spray Place 1 spray into both nostrils as needed for congestion.   Yes [provider]  triamcinolone (NASACORT) 55 MCG/ACT AERO nasal inhaler Place 2 sprays into the nose daily.   Yes [provider]  vitamin B-12 (CYANOCOBALAMIN) 1000 MCG tablet Take 1 tablet (1,000 mcg total) by mouth daily.  12/16/17  Yes Gayland Curry, DO    Physical Exam: Vitals:   09/09/21 1945 09/09/21 1956 09/09/21 1957 09/09/21 1958  BP:      Pulse: (!) 117  70 (!) 110  Resp: (!) '23  13 15  '$ Temp:  99.3 F (37.4 C)    TempSrc:  Oral    SpO2: 93%  93% 95%   Constitutional: NAD, calm, comfortable Eyes: PERRL, lids and conjunctivae normal ENMT: Mucous membranes are moist. Posterior pharynx clear of any exudate or lesions.Normal dentition.  Neck: normal, supple, no masses, no thyromegaly Respiratory: clear to auscultation bilaterally, no wheezing, no crackles. Normal respiratory effort. No accessory muscle use.  Cardiovascular: IRR, IRR, no murmurs / rubs / gallops. No extremity edema. 2+ pedal pulses. No carotid bruits.  Abdomen: no tenderness, no masses palpated. No hepatosplenomegaly. Bowel sounds positive.  Musculoskeletal:  no clubbing / cyanosis. No joint deformity upper and lower extremities. Good ROM, no contractures. Normal muscle tone.  Skin: no rashes, lesions, ulcers. No induration Neurologic: CN 2-12 grossly intact. Sensation intact, DTR normal. Strength 5/5 in all 4.  Psychiatric: Normal judgment and insight. Alert and oriented x 3. Normal mood.   Data Reviewed:       Latest Ref Rng & Units 09/09/2021    2:43 PM 06/09/2021   12:00 AM 12/24/2020    3:46 PM  BMP  Glucose 70 - 99 mg/dL 105   97   BUN 8 - 23 mg/dL '24  29     23   '$ Creatinine 0.44 - 1.00 mg/dL 1.31  1.0     1.10   BUN/Creat Ratio 6 - 22 (calc)   21   Sodium 135 - 145 mmol/L 140  141     143   Potassium 3.5 - 5.1 mmol/L 4.4  4.0     4.5   Chloride 98 - 111 mmol/L 103  104     106   CO2 22 - 32 mmol/L '24  26     29   '$ Calcium 8.9 - 10.3 mg/dL 9.3  9.4     9.5      This result is from an external source.   BNP 349  Trops 21 and 19  EKG: Sinus but intermittently tachycardic.  Looks suspicious for sinus node dysfunction (Sick sinus syndrome) to me.  Not having much (or any really) bradycardia (dont see HR below 60).   HR varying between normal and tachycardic.  Assessment and Plan: * Acute on chronic heart failure with preserved ejection fraction (HFpEF) ([place]) Patient has acute decompensated CHF: Patient presents with: nocturnal cough and dyspnea on exertion / increased shortness of breath Exam findings include: tachycardia Work up findings include: PULMONARY EDEMA ON CXR Pulm edema on CT scan BNP elevation. New O2 requirement Covid neg, WBC nl. EKG: sinus but HR is randomly tachycardic then normal: ? SSS / Tachybrady? CHF pathway Gentle diuresis with lasix Continue HR control with metoprolol Tele monitor 2d echo Daily BMP Cards consult in AM for that very abnormal HR that is intermittently tachy vs normal Will leave BB dosing alone for the moment since it looks like shes more tachycardic, not really bradycardic / having syncope or anything. Ill just defer any changes in BB dosing to cards.  CKD (chronic kidney disease) stage 3, GFR 30-59 ml/min (HCC) Creat 1.3 today appears near baseline Monitor closely with diuresis.  Memory loss, short term Cont aricept + namenda High risk for delirium with hospitalization.  Essential hypertension Cont BB Cont ACEi Cont HCTZ      Advance Care Planning:   Code Status: DNR  Consults: Message sent to cards for AM consult  Family Communication: Daughter at bedside  Severity of Illness: The appropriate patient status for this patient is OBSERVATION. Observation status is judged to be reasonable and necessary in order to provide the required intensity of service to ensure the patient's safety. The patient's presenting symptoms, physical exam findings, and initial radiographic and laboratory data in the context of their medical condition is felt to place them at decreased risk for further clinical deterioration. Furthermore, it is anticipated that the patient will be medically stable for discharge from the hospital within 2 midnights of admission.    Author: Etta Quill., DO 09/09/2021 9:04 PM  For on call review [email or web address].

## 2021-09-09 NOTE — Assessment & Plan Note (Signed)
1. Cont BB 2. Cont ACEi 3. Cont HCTZ

## 2021-09-10 ENCOUNTER — Observation Stay (HOSPITAL_COMMUNITY): Payer: Medicare Other

## 2021-09-10 DIAGNOSIS — R54 Age-related physical debility: Secondary | ICD-10-CM | POA: Diagnosis present

## 2021-09-10 DIAGNOSIS — Z96641 Presence of right artificial hip joint: Secondary | ICD-10-CM | POA: Diagnosis present

## 2021-09-10 DIAGNOSIS — R413 Other amnesia: Secondary | ICD-10-CM | POA: Diagnosis present

## 2021-09-10 DIAGNOSIS — R051 Acute cough: Secondary | ICD-10-CM | POA: Diagnosis not present

## 2021-09-10 DIAGNOSIS — Z79899 Other long term (current) drug therapy: Secondary | ICD-10-CM | POA: Diagnosis not present

## 2021-09-10 DIAGNOSIS — I471 Supraventricular tachycardia: Secondary | ICD-10-CM

## 2021-09-10 DIAGNOSIS — I5189 Other ill-defined heart diseases: Secondary | ICD-10-CM | POA: Diagnosis not present

## 2021-09-10 DIAGNOSIS — Z66 Do not resuscitate: Secondary | ICD-10-CM | POA: Diagnosis present

## 2021-09-10 DIAGNOSIS — J439 Emphysema, unspecified: Secondary | ICD-10-CM | POA: Diagnosis present

## 2021-09-10 DIAGNOSIS — I130 Hypertensive heart and chronic kidney disease with heart failure and stage 1 through stage 4 chronic kidney disease, or unspecified chronic kidney disease: Secondary | ICD-10-CM | POA: Diagnosis present

## 2021-09-10 DIAGNOSIS — I5031 Acute diastolic (congestive) heart failure: Secondary | ICD-10-CM

## 2021-09-10 DIAGNOSIS — I5032 Chronic diastolic (congestive) heart failure: Secondary | ICD-10-CM | POA: Diagnosis not present

## 2021-09-10 DIAGNOSIS — N1832 Chronic kidney disease, stage 3b: Secondary | ICD-10-CM | POA: Diagnosis present

## 2021-09-10 DIAGNOSIS — Z882 Allergy status to sulfonamides status: Secondary | ICD-10-CM | POA: Diagnosis not present

## 2021-09-10 DIAGNOSIS — Z7983 Long term (current) use of bisphosphonates: Secondary | ICD-10-CM | POA: Diagnosis not present

## 2021-09-10 DIAGNOSIS — J9811 Atelectasis: Secondary | ICD-10-CM | POA: Diagnosis present

## 2021-09-10 DIAGNOSIS — Z20822 Contact with and (suspected) exposure to covid-19: Secondary | ICD-10-CM | POA: Diagnosis present

## 2021-09-10 DIAGNOSIS — J069 Acute upper respiratory infection, unspecified: Secondary | ICD-10-CM | POA: Diagnosis present

## 2021-09-10 DIAGNOSIS — Z87891 Personal history of nicotine dependence: Secondary | ICD-10-CM | POA: Diagnosis not present

## 2021-09-10 DIAGNOSIS — I5033 Acute on chronic diastolic (congestive) heart failure: Secondary | ICD-10-CM | POA: Diagnosis present

## 2021-09-10 DIAGNOSIS — Z888 Allergy status to other drugs, medicaments and biological substances status: Secondary | ICD-10-CM | POA: Diagnosis not present

## 2021-09-10 DIAGNOSIS — R000 Tachycardia, unspecified: Secondary | ICD-10-CM | POA: Diagnosis present

## 2021-09-10 DIAGNOSIS — M170 Bilateral primary osteoarthritis of knee: Secondary | ICD-10-CM | POA: Diagnosis present

## 2021-09-10 DIAGNOSIS — E7800 Pure hypercholesterolemia, unspecified: Secondary | ICD-10-CM | POA: Diagnosis present

## 2021-09-10 DIAGNOSIS — E876 Hypokalemia: Secondary | ICD-10-CM | POA: Diagnosis not present

## 2021-09-10 DIAGNOSIS — E1122 Type 2 diabetes mellitus with diabetic chronic kidney disease: Secondary | ICD-10-CM | POA: Diagnosis present

## 2021-09-10 DIAGNOSIS — M8580 Other specified disorders of bone density and structure, unspecified site: Secondary | ICD-10-CM | POA: Diagnosis present

## 2021-09-10 LAB — BASIC METABOLIC PANEL
Anion gap: 12 (ref 5–15)
BUN: 24 mg/dL — ABNORMAL HIGH (ref 8–23)
CO2: 26 mmol/L (ref 22–32)
Calcium: 9 mg/dL (ref 8.9–10.3)
Chloride: 102 mmol/L (ref 98–111)
Creatinine, Ser: 1.33 mg/dL — ABNORMAL HIGH (ref 0.44–1.00)
GFR, Estimated: 37 mL/min — ABNORMAL LOW (ref >60)
Glucose, Bld: 131 mg/dL — ABNORMAL HIGH (ref 70–99)
Potassium: 3.4 mmol/L — ABNORMAL LOW (ref 3.5–5.1)
Sodium: 140 mmol/L (ref 135–145)

## 2021-09-10 LAB — ECHOCARDIOGRAM COMPLETE
AR max vel: 2.58 cm2
AV Area VTI: 3.07 cm2
AV Area mean vel: 2.32 cm2
AV Mean grad: 3 mmHg
AV Peak grad: 4 mmHg
Ao pk vel: 1 m/s
Area-P 1/2: 2.07 cm2
Calc EF: 55.7 %
Height: 68 in
S' Lateral: 2.4 cm
Single Plane A2C EF: 51.9 %
Single Plane A4C EF: 57 %
Weight: 2885.38 oz

## 2021-09-10 LAB — RESPIRATORY PANEL BY PCR

## 2021-09-10 LAB — TSH: TSH: 0.468 u[IU]/mL (ref 0.350–4.500)

## 2021-09-10 MED ORDER — PANTOPRAZOLE SODIUM 40 MG PO TBEC
40.0000 mg | DELAYED_RELEASE_TABLET | Freq: Every day | ORAL | Status: DC
  Administered 2021-09-10: 40 mg via ORAL
  Filled 2021-09-10: qty 1

## 2021-09-10 MED ORDER — GUAIFENESIN ER 600 MG PO TB12
600.0000 mg | ORAL_TABLET | Freq: Two times a day (BID) | ORAL | Status: DC
  Administered 2021-09-10 – 2021-09-11 (×3): 600 mg via ORAL
  Filled 2021-09-10 (×3): qty 1

## 2021-09-10 MED ORDER — POTASSIUM CHLORIDE CRYS ER 20 MEQ PO TBCR
40.0000 meq | EXTENDED_RELEASE_TABLET | Freq: Two times a day (BID) | ORAL | Status: AC
Start: 2021-09-10 — End: 2021-09-10
  Administered 2021-09-10 (×2): 40 meq via ORAL
  Filled 2021-09-10 (×2): qty 2

## 2021-09-10 MED ORDER — FUROSEMIDE 10 MG/ML IJ SOLN
20.0000 mg | Freq: Two times a day (BID) | INTRAMUSCULAR | Status: DC
  Filled 2021-09-10: qty 2

## 2021-09-10 MED ORDER — LOSARTAN POTASSIUM 50 MG PO TABS
50.0000 mg | ORAL_TABLET | Freq: Every day | ORAL | Status: DC
Start: 2021-09-11 — End: 2021-09-11

## 2021-09-10 MED ORDER — ORAL CARE MOUTH RINSE: 15.0000 mL | OROMUCOSAL | Status: DC | PRN

## 2021-09-10 MED ORDER — EMPAGLIFLOZIN 10 MG PO TABS
10.0000 mg | ORAL_TABLET | Freq: Every day | ORAL | Status: DC
  Administered 2021-09-10: 10 mg via ORAL
  Filled 2021-09-10: qty 1

## 2021-09-10 NOTE — Progress Notes (Signed)
Spoke to pt's dtr who confirmed pt admitted from [facility] with 24/7 aides and the plan is for return at dc. Pt's dtr to provide transport.   Wandra Feinstein, MSW, LCSW [phone] (coverage)

## 2021-09-10 NOTE — Progress Notes (Signed)
PROGRESS NOTE    Kelsey Salinas  QJF:[id number] DOB: [birth year] DOA: 09/09/2021 PCP: Virgie Dad, MD  89/F with history of chronic diastolic CHF, hypertension, CKD 3a, mild memory loss presented to the ED with gradual onset dyspnea and cough for few days, was started on a cough suppressant few days prior, concern for irregular heart rate and tachycardia at [facility]. -In the ED temp was 99.4, intermittently tachycardic, labs noted creatinine of 1.3, BNP 349, D-dimer 1.4, chest x-ray noted cardiomegaly and possible interstitial edema, CTA chest noted emphysema and possible atelectasis versus mild asymmetric edema. -Given Lasix 20 Mg x1 in the ED   Subjective: Feels better, breathing is improving  Assessment and Plan:  Acute on chronic diastolic CHF  -Clinically improving, continue low-dose IV Lasix today -She is 1 L negative overnight -Check 2D echo, last echo in 2013 with preserved EF -Continue metoprolol and Cozaar, DC HCTZ -She is on a very high dose of metoprolol however heart rate is intermittently elevated, with wide fluctuation, no Afib noted, await Cards input and check TSH -Caution with GDMT given advanced age and frailty -Temp was 99.4 in the ED yesterday, will also check respiratory virus panel, covid PCR was neg  CKD (chronic kidney disease) stage 3, GFR 30-59 ml/min (HCC) -Creat stable at baseline, continue losartan will decrease dose  Hypokalemia -replaced  Memory loss, short term Cont aricept + namenda High risk for delirium with hospitalization.  Essential hypertension -Continue beta-blocker, decrease losartan dose -Discontinue HCTZ  DVT prophylaxis: lovenox Code Status: DNR Family Communication: no family at bedside, will update daughter Disposition Plan: Home likley 1-2days if ECHo not significantly changed  Consultants: Cards   Procedures:   Antimicrobials:    Objective: Vitals:   09/09/21 2300 09/10/21 0703 09/10/21 0706 09/10/21 0718   BP:  (!) 144/67  119/64  Pulse:    64  Resp: 18 15  (!) 21  Temp:    (!) 97.2 F (36.2 C)  TempSrc:    Oral  SpO2: 94% (!) 88% 92% 95%  Weight:  81.8 kg    Height:        Intake/Output Summary (Last 24 hours) at 09/10/2021 1138 Last data filed at 09/10/2021 1004 Gross per 24 hour  Intake 303 ml  Output 1400 ml  Net -1097 ml   Filed Weights   09/09/21 2143 09/10/21 0703  Weight: 82.2 kg 81.8 kg    Examination:  General exam: Elderly pleasant female sitting up in bed, AAOx3, no distress HEENT: Positive JVD CVS: S1-S2, regular rhythm Lungs: Rare basilar rales otherwise clear Abdomen: Soft, nontender, bowel sounds present Extremities: Trace edema  Skin: No rashes Psychiatry:  Mood & affect appropriate.     Data Reviewed:   CBC: Recent Labs  Lab 09/09/21 1443  WBC 9.0  HGB 13.7  HCT 42.2  MCV 93.8  PLT 893   Basic Metabolic Panel: Recent Labs  Lab 09/09/21 1443 09/10/21 0322  NA 140 140  K 4.4 3.4*  CL 103 102  CO2 24 26  GLUCOSE 105* 131*  BUN 24* 24*  CREATININE 1.31* 1.33*  CALCIUM 9.3 9.0   GFR: Estimated Creatinine Clearance: 29.7 mL/min (A) (by C-G formula based on SCr of 1.33 mg/dL (H)). Liver Function Tests: No results for input(s): "AST", "ALT", "ALKPHOS", "BILITOT", "PROT", "ALBUMIN" in the last 168 hours. No results for input(s): "LIPASE", "AMYLASE" in the last 168 hours. No results for input(s): "AMMONIA" in the last 168 hours. Coagulation Profile: No results for input(s): "INR", "  PROTIME" in the last 168 hours. Cardiac Enzymes: No results for input(s): "CKTOTAL", "CKMB", "CKMBINDEX", "TROPONINI" in the last 168 hours. BNP (last 3 results) No results for input(s): "PROBNP" in the last 8760 hours. HbA1C: No results for input(s): "HGBA1C" in the last 72 hours. CBG: No results for input(s): "GLUCAP" in the last 168 hours. Lipid Profile: No results for input(s): "CHOL", "HDL", "LDLCALC", "TRIG", "CHOLHDL", "LDLDIRECT" in the last 72  hours. Thyroid Function Tests: No results for input(s): "TSH", "T4TOTAL", "FREET4", "T3FREE", "THYROIDAB" in the last 72 hours. Anemia Panel: No results for input(s): "VITAMINB12", "FOLATE", "FERRITIN", "TIBC", "IRON", "RETICCTPCT" in the last 72 hours. Urine analysis:    Component Value Date/Time   COLORURINE YELLOW 05/19/2017 0949   APPEARANCEUR CLEAR 05/19/2017 0949   LABSPEC 1.031 (H) 05/19/2017 0949   PHURINE 5.0 05/19/2017 0949   GLUCOSEU NEGATIVE 05/19/2017 0949   HGBUR NEGATIVE 05/19/2017 0949   BILIRUBINUR NEGATIVE 05/19/2017 0949   KETONESUR NEGATIVE 05/19/2017 0949   PROTEINUR NEGATIVE 05/19/2017 0949   UROBILINOGEN 0.2 03/05/2008 0957   NITRITE POSITIVE (A) 05/19/2017 0949   LEUKOCYTESUR NEGATIVE 05/19/2017 0949   Sepsis Labs: '@LABRCNTIP'$ (procalcitonin:4,lacticidven:4)  ) Recent Results (from the past 240 hour(s))  SARS Coronavirus 2 by RT PCR (hospital order, performed in [facility] hospital lab) *cepheid single result test* Anterior Nasal Swab     Status: None   Collection Time: 09/09/21  2:44 PM   Specimen: Anterior Nasal Swab  Result Value Ref Range Status   SARS Coronavirus 2 by RT PCR NEGATIVE NEGATIVE Final    Comment: (NOTE) SARS-CoV-2 target nucleic acids are NOT DETECTED.  The SARS-CoV-2 RNA is generally detectable in upper and lower respiratory specimens during the acute phase of infection. The lowest concentration of SARS-CoV-2 viral copies this assay can detect is 250 copies / mL. A negative result does not preclude SARS-CoV-2 infection and should not be used as the sole basis for treatment or other patient management decisions.  A negative result may occur with improper specimen collection / handling, submission of specimen other than nasopharyngeal swab, presence of viral mutation(s) within the areas targeted by this assay, and inadequate number of viral copies (<250 copies / mL). A negative result must be combined with clinical observations,  patient history, and epidemiological information.  Fact Sheet for Patients:   https://www.patel.info/  Fact Sheet for Healthcare Providers: https://hall.com/  This test is not yet approved or  cleared by the [place] FDA and has been authorized for detection and/or diagnosis of SARS-CoV-2 by FDA under an Emergency Use Authorization (EUA).  This EUA will remain in effect (meaning this test can be used) for the duration of the COVID-19 declaration under Section 564(b)(1) of the Act, 21 U.S.C. section 360bbb-3(b)(1), unless the authorization is terminated or revoked sooner.  Performed at [facility], [place] [address], [place], [place] [address]      Radiology Studies: CT Angio Chest PE W/Cm &/Or Wo Cm  Result Date: 09/09/2021 CLINICAL DATA:  Hypoxia EXAM: CT ANGIOGRAPHY CHEST WITH CONTRAST TECHNIQUE: Multidetector CT imaging of the chest was performed using the standard protocol during bolus administration of intravenous contrast. Multiplanar CT image reconstructions and MIPs were obtained to evaluate the vascular anatomy. RADIATION DOSE REDUCTION: This exam was performed according to the departmental dose-optimization program which includes automated exposure control, adjustment of the mA and/or kV according to patient size and/or use of iterative reconstruction technique. CONTRAST:  38m OMNIPAQUE IOHEXOL 350 MG/ML SOLN COMPARISON:  Chest x-ray 09/09/2021 8 FINDINGS:  Cardiovascular: Satisfactory opacification of the pulmonary arteries to the segmental level. No evidence of pulmonary embolism. Moderate aortic atherosclerosis. No aneurysm. Coronary vascular calcification. Normal cardiac size. No pericardial effusion Mediastinum/Nodes: Midline trachea. No thyroid mass. No suspicious lymph nodes. Esophagus within normal limits. Lungs/Pleura: Emphysema. No pleural effusion. Mild hazy pulmonary density within the right greater than left lung  fields. No significant effusion or pneumothorax. Upper Abdomen: No acute abnormality. Musculoskeletal: No chest wall abnormality. No acute or significant osseous findings. Review of the MIP images confirms the above findings. IMPRESSION: 1. Negative for acute pulmonary embolus. 2. Emphysema 3. Slight hazy right greater than left pulmonary density, could be due to diffuse atelectasis or minimal asymmetric edema. Aortic Atherosclerosis (ICD10-I70.0) and Emphysema (ICD10-J43.9). Electronically Signed   By: Donavan Foil M.D.   On: 09/09/2021 19:17   DG Chest Portable 1 View  Result Date: 09/09/2021 CLINICAL DATA:  Shortness of breath EXAM: PORTABLE CHEST 1 VIEW COMPARISON:  12/09/2020 FINDINGS: Poor inspiration. Cardiomegaly. Aortic atherosclerosis and tortuosity. Increased interstitial markings that could represent early interstitial edema. No consolidation or lobar collapse. No effusion. IMPRESSION: Cardiomegaly and aortic atherosclerosis. Possible early interstitial edema. Electronically Signed   By: Nelson Chimes M.D.   On: 09/09/2021 15:12     Scheduled Meds:  atorvastatin  40 mg Oral Daily   cyanocobalamin  1,000 mcg Oral Daily   diclofenac Sodium  2 g Topical QID   donepezil  10 mg Oral QHS   enoxaparin (LOVENOX) injection  40 mg Subcutaneous Q24H   escitalopram  5 mg Oral Daily   famotidine  20 mg Oral Daily   furosemide  20 mg Intravenous Daily   loratadine  10 mg Oral Daily   losartan  100 mg Oral Daily   memantine  10 mg Oral BID   metoprolol succinate  150 mg Oral BID   polyethylene glycol  17 g Oral Daily   potassium chloride  40 mEq Oral BID   sodium chloride flush  3 mL Intravenous Q12H   triamcinolone  2 spray Nasal Daily   Continuous Infusions:  sodium chloride       LOS: 0 days    Time spent: 37mn   PDomenic Polite MD [facility]   09/10/2021, 11:38 AM

## 2021-09-10 NOTE — Consult Note (Signed)
Cardiology Consultation   Patient ID: Kelsey Salinas MRN: [id number]; DOB: Nov 10, 1926  Admit date: 09/09/2021 Date of Consult: 09/10/2021  PCP:  Virgie Dad, MD   [place] Providers Cardiologist:  Sinclair Grooms, MD   {   Patient Profile:   Kelsey Salinas is a 88 y.o. female with medical history notable for chronic diastolic heart failure, hypertension, chronic kidney disease stage IIIa, memory loss who is being seen today for the evaluation of irregular heart rate at the request of Domenic Polite, MD  History of Present Illness:   Kelsey Salinas is a 88 year old female who resides at [facility]. Past medical history noted above. She is has been followed by Dr. Tamala Julian in outpatient cardiology since 2014 for management of congestive diastolic heart failure and irregular heart rhythm. Review of records shows history of rapid resting HR as far back as 12/25/2012 without symptoms reported. Patient has been rate controlled on Metoprolol Succinate '150mg'$  BID since 2014. 04/04/2017 holter monitor showed NSR with occasional PVC's with triplets, rare atrial bigeminy during sleep, and no sustained arrhythmias. Average HR at that time was 92 bpm, max HR 133 bpm, min 60 bpm.  Patient presented to the ED after staff at [facility] noted gradual worsening of cough and dyspnea over the last few days. She was also found with an irregular HR and intermittent tachycardia. In the ED, she was given IV lasix '20mg'$  once and was noted with good clinical response. CXR showed cardiomegaly with interstitial edema. CTA with emphysema and atelectasis vs. Mild asymmetric edema. HR continued to be intermittently elevated on telemetry without observation of atrial fibrillation. Patient has been noted with improved respiratory status after IV diuresis.   On exam today, patient denies shortness of breath, chest pain, palpitations, or dizziness/near syncope both currently and in  recent days at home. She reports feeling like she is back at baseline and very much wishes to return home.   Past Medical History:  Diagnosis Date   Abscess of right leg 12/24/2020   Childhood asthma    Chronic diastolic heart failure ([place]) 12/25/2012   October 2013 echocardiogram 1. There is moderate concentric left ventricle hypertrophy. 2. Left ventricular ejection fraction estimated by 2D at 60-65 percent. 3. There were no regional wall motion abnormalities. 4. Mild mitral annular calcification. 5. Mild mitral valve regurgitation. 6. There is mild tricuspid regurgitation. 7. Mildly elevated estimated right ventricular systolic pressure. 8. Right ventricular systolic pressure estimated at 35-40 mm Hg. 9. Mild calcification of the aortic valve. 10. Trace aortic valve regurgitation. 11. Analysis of mitral valve inflow, pulmonary vein Doppler and tissue Doppler suggests grade I diastolic dysfunction without elevated left atrial pressure.    Colon polyp    Diverticular stricture ([place]) 2009   Diverticulosis 10/30/2007   DM (diabetes mellitus), type 2 ([place])    Edema 09/12/2013   Fall 05/18/2017   tripped over wheel of walker; superior and inferior pubic ramus fractures    Hypercholesterolemia    Hypertension    Hypertension, essential    Hypothyroidism    as a teenager   Indigestion    Insomnia    OA (osteoarthritis) of hip    right hip replacement, OA in bilateral knees   Osteopenia    Actonel stopped in 2012 after 5years therapy   Pneumonia 1939   "hospitalized"   Rapid resting heart rate 93/23/5573   Uncertain cause. Possible ectopic atrial tachycardia. Date entered 12/25/12  Right rotator cuff tear    Seasonal allergies     Past Surgical History:  Procedure Laterality Date   CATARACT EXTRACTION W/ INTRAOCULAR LENS  IMPLANT, BILATERAL Bilateral    COLONOSCOPY  2009   Dr. [facility]   DILATION AND CURETTAGE OF UTERUS     FRACTURE SURGERY     JOINT REPLACEMENT     ORIF WRIST  FRACTURE Right    REDUCTION MAMMAPLASTY  1999   TOTAL HIP ARTHROPLASTY Right 03-04-08   Dr. Wynelle Link     Home Medications:  Prior to Admission medications   Medication Sig Start Date End Date Taking? Authorizing Provider  acetaminophen (TYLENOL) 500 MG tablet Take 500 mg by mouth every 6 (six) hours as needed for mild pain, headache or fever.   Yes [provider]  alendronate (FOSAMAX) 70 MG tablet Take 70 mg by mouth once a week. Take with a full glass of water on an empty stomach.   Yes [provider]  atorvastatin (LIPITOR) 40 MG tablet TAKE 1 TABLET ONCE DAILY. Patient taking differently: Take 40 mg by mouth daily. 02/13/21  Yes Virgie Dad, MD  diclofenac Sodium (VOLTAREN) 1 % GEL Apply 2 g topically 4 (four) times daily.   Yes [provider]  donepezil (ARICEPT) 10 MG tablet TAKE ONE TABLET BY MOUTH AT BEDTIME Patient taking differently: Take 10 mg by mouth at bedtime. 06/03/21  Yes Virgie Dad, MD  escitalopram (LEXAPRO) 5 MG tablet Take 5 mg by mouth daily.   Yes [provider]  famotidine (PEPCID) 20 MG tablet Take 20 mg by mouth daily.   Yes [provider]  fexofenadine (ALLEGRA) 180 MG tablet Take 1 tablet (180 mg total) by mouth daily. 06/02/20  Yes Wert, Margreta Journey, NP  hydrochlorothiazide (HYDRODIURIL) 25 MG tablet TAKE 1 TABLET ONCE A DAY WITH LOSARTAN UNTIL COMBO TABLET BACK AVAILABLE. Patient taking differently: Take 25 mg by mouth daily. Take with Losartan 07/20/21  Yes Virgie Dad, MD  losartan (COZAAR) 100 MG tablet TAKE 1 TABLET DAILY WITH HCTZ TIL COMBINATION PILL AVAILABLE. Patient taking differently: Take 100 mg by mouth daily. Take with HCTZ 07/20/21  Yes Virgie Dad, MD  memantine (NAMENDA) 10 MG tablet TAKE 1 TABLET BY MOUTH TWICE DAILY. Patient taking differently: Take 10 mg by mouth 2 (two) times daily. 09/25/20  Yes Virgie Dad, MD  metoprolol succinate (TOPROL-XL) 100 MG 24 hr tablet TAKE 1&1/2 TABLETS  TWICE DAILY. Patient taking differently: Take 150 mg by mouth 2 (two) times daily. 08/19/21  Yes Belva Crome, MD  polyethylene glycol (MIRALAX / GLYCOLAX) 17 g packet Take 17 g by mouth daily.   Yes [provider]  sodium chloride (OCEAN) 0.65 % SOLN nasal spray Place 1 spray into both nostrils as needed for congestion.   Yes [provider]  triamcinolone (NASACORT) 55 MCG/ACT AERO nasal inhaler Place 2 sprays into the nose daily.   Yes [provider]  vitamin B-12 (CYANOCOBALAMIN) 1000 MCG tablet Take 1 tablet (1,000 mcg total) by mouth daily. 12/16/17  Yes Gayland Curry, DO    Inpatient Medications: Scheduled Meds:  atorvastatin  40 mg Oral Daily   cyanocobalamin  1,000 mcg Oral Daily   diclofenac Sodium  2 g Topical QID   donepezil  10 mg Oral QHS   enoxaparin (LOVENOX) injection  40 mg Subcutaneous Q24H   escitalopram  5 mg Oral Daily   famotidine  20 mg Oral Daily  furosemide  20 mg Intravenous BID   guaiFENesin  600 mg Oral BID   loratadine  10 mg Oral Daily   [START ON 09/11/2021] losartan  50 mg Oral Daily   memantine  10 mg Oral BID   metoprolol succinate  150 mg Oral BID   pantoprazole  40 mg Oral Q1200   polyethylene glycol  17 g Oral Daily   potassium chloride  40 mEq Oral BID   sodium chloride flush  3 mL Intravenous Q12H   triamcinolone  2 spray Nasal Daily   Continuous Infusions:  sodium chloride     PRN Meds: sodium chloride, acetaminophen, ondansetron (ZOFRAN) IV, mouth rinse, sodium chloride, sodium chloride flush  Allergies:    Allergies  Allergen Reactions   Amlodipine Besylate Other (See Comments)    Edema   Lisinopril Cough    Cough    Sulfamethoxazole-Trimethoprim Other (See Comments)    Unknown reaction     Social History:   Social History   Socioeconomic History   Marital status: Married    Spouse name: Not on file   Number of children: Not on file   Years of education: Not on file   Highest education  level: Not on file  Occupational History   Occupation: retired travel agent  Tobacco Use   Smoking status: Former    Packs/day: 0.12    Years: 50.00    Total pack years: 6.00    Types: Cigarettes   Smokeless tobacco: Never   Tobacco comments:    "stopped in the ate 1990s"  Vaping Use   Vaping Use: Never used  Substance and Sexual Activity   Alcohol use: Yes    Comment: rarely   Drug use: Never   Sexual activity: Not Currently  Other Topics Concern   Not on file  Social History Narrative   Lives at [place] since 2010   Husband Ilona Sorrel married 1952 (husband in memory care unit at [facility])   Former smoker 1/2 PPD, for 30 years, stopped 01/10/1989   Alcohol 2 glasses of wine at night   Exercise: machines 3 times a week and daily walk   No POA/LW         Social Determinants of Health   Financial Resource Strain: Low Risk  (07/19/2017)   Overall Financial Resource Strain (CARDIA)    Difficulty of Paying Living Expenses: Not hard at all  Food Insecurity: No Food Insecurity (07/19/2017)   Hunger Vital Sign    Worried About Running Out of Food in the Last Year: Never true    [place] in the Last Year: Never true  Transportation Needs: No Transportation Needs (07/19/2017)   PRAPARE - Hydrologist (Medical): No    Lack of Transportation (Non-Medical): No  Physical Activity: Insufficiently Active (07/19/2017)   Exercise Vital Sign    Days of Exercise per Week: 7 days    Minutes of Exercise per Session: 20 min  Stress: Stress Concern Present (07/19/2017)   [place]    Feeling of Stress : To some extent  Social Connections: Socially Integrated (07/19/2017)   Social Connection and Isolation Panel [NHANES]    Frequency of Communication with Friends and Family: More than three times a week    Frequency of Social Gatherings with Friends and Family: More than three times a week     Attends Religious Services: More than 4 times per year  Active Member of Clubs or Organizations: Yes    Attends Archivist Meetings: Never    Marital Status: Married  Human resources officer Violence: Not At Risk (07/19/2017)   Humiliation, Afraid, Rape, and Kick questionnaire    Fear of Current or Ex-Partner: No    Emotionally Abused: No    Physically Abused: No    Sexually Abused: No    Family History:    Family History  Problem Relation Age of Onset   Heart disease Mother    Heart disease Father    Heart disease Brother      ROS:  Please see the history of present illness.   All other ROS reviewed and negative.     Physical Exam/Data:   Vitals:   09/10/21 0703 09/10/21 0706 09/10/21 0718 09/10/21 1155  BP: (!) 144/67  119/64 (!) 124/90  Pulse:   64 68  Resp: 15  (!) 21 (!) 21  Temp:   (!) 97.2 F (36.2 C) 97.8 F (36.6 C)  TempSrc:   Oral Oral  SpO2: (!) 88% 92% 95% 91%  Weight: 81.8 kg     Height:        Intake/Output Summary (Last 24 hours) at 09/10/2021 1313 Last data filed at 09/10/2021 1253 Gross per 24 hour  Intake 363 ml  Output 1400 ml  Net -1037 ml      09/10/2021    7:03 AM 09/09/2021    9:43 PM 04/01/2021   10:06 AM  Last 3 Weights  Weight (lbs) 180 lb 5.4 oz 181 lb 4.8 oz 183 lb  Weight (kg) 81.8 kg 82.237 kg 83.008 kg     Body mass index is 27.42 kg/m.  General:  Well nourished, well developed, in no acute distress HEENT: normal Neck: Hepatojugular reflex positive. JVP slightly elevated, approximately 2cm above clavicle Vascular: No carotid bruits; Distal pulses 2+ bilaterally Cardiac:  normal S1, S2; RRR; no murmur Lungs:  no wheezing, rhonchi bilaterally. Mild bibasilar crackles noted (atelectasis vs fluid).  Abd: soft, nontender, no hepatomegaly  Ext: no edema Musculoskeletal:  No deformities, BUE and BLE strength normal and equal Skin: warm and dry  Neuro:  CNs 2-12 intact, no focal abnormalities noted Psych:  Normal affect    EKG:  The EKG was personally reviewed and demonstrates:  sinus rhythm Telemetry:  Telemetry was personally reviewed and demonstrates:  sinus arrhythmia   Relevant CV Studies: Echo completed. Official read pending.  Laboratory Data:  High Sensitivity Troponin:   Recent Labs  Lab 09/09/21 1443 09/09/21 1713  TROPONINIHS 21* 19*     Chemistry Recent Labs  Lab 09/09/21 1443 09/10/21 0322  NA 140 140  K 4.4 3.4*  CL 103 102  CO2 24 26  GLUCOSE 105* 131*  BUN 24* 24*  CREATININE 1.31* 1.33*  CALCIUM 9.3 9.0  GFRNONAA 38* 37*  ANIONGAP 13 12    No results for input(s): "PROT", "ALBUMIN", "AST", "ALT", "ALKPHOS", "BILITOT" in the last 168 hours. Lipids No results for input(s): "CHOL", "TRIG", "HDL", "LABVLDL", "LDLCALC", "CHOLHDL" in the last 168 hours.  Hematology Recent Labs  Lab 09/09/21 1443  WBC 9.0  RBC 4.50  HGB 13.7  HCT 42.2  MCV 93.8  MCH 30.4  MCHC 32.5  RDW 13.2  PLT 253   Thyroid  Recent Labs  Lab 09/10/21 0322  TSH 0.468    BNP Recent Labs  Lab 09/09/21 1443  BNP 349.3*    DDimer  Recent Labs  Lab 09/09/21 1443  DDIMER 1.45*     Radiology/Studies:  CT Angio Chest PE W/Cm &/Or Wo Cm  Result Date: 09/09/2021 CLINICAL DATA:  Hypoxia EXAM: CT ANGIOGRAPHY CHEST WITH CONTRAST TECHNIQUE: Multidetector CT imaging of the chest was performed using the standard protocol during bolus administration of intravenous contrast. Multiplanar CT image reconstructions and MIPs were obtained to evaluate the vascular anatomy. RADIATION DOSE REDUCTION: This exam was performed according to the departmental dose-optimization program which includes automated exposure control, adjustment of the mA and/or kV according to patient size and/or use of iterative reconstruction technique. CONTRAST:  48m OMNIPAQUE IOHEXOL 350 MG/ML SOLN COMPARISON:  Chest x-ray 09/09/2021 8 FINDINGS: Cardiovascular: Satisfactory opacification of the pulmonary arteries to the segmental  level. No evidence of pulmonary embolism. Moderate aortic atherosclerosis. No aneurysm. Coronary vascular calcification. Normal cardiac size. No pericardial effusion Mediastinum/Nodes: Midline trachea. No thyroid mass. No suspicious lymph nodes. Esophagus within normal limits. Lungs/Pleura: Emphysema. No pleural effusion. Mild hazy pulmonary density within the right greater than left lung fields. No significant effusion or pneumothorax. Upper Abdomen: No acute abnormality. Musculoskeletal: No chest wall abnormality. No acute or significant osseous findings. Review of the MIP images confirms the above findings. IMPRESSION: 1. Negative for acute pulmonary embolus. 2. Emphysema 3. Slight hazy right greater than left pulmonary density, could be due to diffuse atelectasis or minimal asymmetric edema. Aortic Atherosclerosis (ICD10-I70.0) and Emphysema (ICD10-J43.9). Electronically Signed   By: KDonavan FoilM.D.   On: 09/09/2021 19:17   DG Chest Portable 1 View  Result Date: 09/09/2021 CLINICAL DATA:  Shortness of breath EXAM: PORTABLE CHEST 1 VIEW COMPARISON:  12/09/2020 FINDINGS: Poor inspiration. Cardiomegaly. Aortic atherosclerosis and tortuosity. Increased interstitial markings that could represent early interstitial edema. No consolidation or lobar collapse. No effusion. IMPRESSION: Cardiomegaly and aortic atherosclerosis. Possible early interstitial edema. Electronically Signed   By: MNelson ChimesM.D.   On: 09/09/2021 15:12     Assessment and Plan:  NGalilee Pierronis a 88y.o. female with medical history notable for chronic diastolic heart failure, hypertension, chronic kidney disease stage IIIa, memory loss who is being seen today for the evaluation of irregular heart rate at the request of JDomenic Polite MD.   Atrial arrhythmia  Patient with long-standing history of atrial arrhythmia back through 2014 that has been stable in management with BID Metoprolol Succinate '150mg'$ . During this admission for  acute exacerbation of diastolic HF, patient observed to have intermittent tachycardia without incidence of afib. Given that patient has long history of atrial arrhythmia and is completely asymptomatic, it is likely that observed tachycardia is a chronic finding. Do not suspect that tachycardia is contributing to HF. Recommend continued high dose daily BB with close clinic follow up.   2.   Acute on chronic diastolic HF, essential HTN  Patient with symptoms of acute exacerbation of diastolic HF on admission, now appears near-euvolemic after IV diuresis. Echo reassuring of stable fluid status. Recommend initiation of [name] to support slow daily diuresis in place of HCTZ. Okay with Losartan dose reduction.  Per primary team: CKD Hypokalemia Memory loss  Risk Assessment/Risk Scores:     New York Heart Association (NYHA) Functional Class NYHA Class II    [facility] will sign off.   Medication Recommendations:  Continue Metoprolol Succinate '150mg'$  BID. Start [name] '10mg'$  QD. STOP HCTZ. Other recommendations (labs, testing, etc):  BMP Follow up as an outpatient:  cardiology clinic with Dr. STamala Julian~2 weeks.  For questions or updates, please contact CDearbornPlease consult  [email or web address] for contact info under    Signed, Lily Kocher, PA-C  09/10/2021 1:13 PM

## 2021-09-10 NOTE — Progress Notes (Signed)
O2 sat-88% RA. Pt. Refused to use oxygen. Education was given.

## 2021-09-10 NOTE — TOC Progression Note (Signed)
Transition of Care [facility]) - Progression Note    Patient Details  Name: Kelsey Salinas MRN: [id number] Date of Birth: 02-23-1926  [facility] CM/SW Contact  Zenon Mayo, RN Phone Number: 09/10/2021, 4:26 PM  Clinical Narrative:    Patient is form [facility], presents with a/c CHF, conts on IV lasix, 2 liters oxygen , mostly w/chair bound., she has 24 hr care with aides at [place], daughter will transport her home at dc per CSW note. Patient has a scale at home but does not weigh daily , will try to weigh more, has bp checked daily, and eats a low sodium diet. TOC following.         Expected Discharge Plan and Services                                                 Social Determinants of Health (SDOH) Interventions    Readmission Risk Interventions     No data to display

## 2021-09-11 ENCOUNTER — Telehealth: Payer: Self-pay | Admitting: Interventional Cardiology

## 2021-09-11 DIAGNOSIS — I5033 Acute on chronic diastolic (congestive) heart failure: Secondary | ICD-10-CM | POA: Diagnosis not present

## 2021-09-11 DIAGNOSIS — I5032 Chronic diastolic (congestive) heart failure: Secondary | ICD-10-CM

## 2021-09-11 DIAGNOSIS — N1832 Chronic kidney disease, stage 3b: Secondary | ICD-10-CM

## 2021-09-11 LAB — BASIC METABOLIC PANEL
Anion gap: 12 (ref 5–15)
BUN: 28 mg/dL — ABNORMAL HIGH (ref 8–23)
CO2: 26 mmol/L (ref 22–32)
Calcium: 9.1 mg/dL (ref 8.9–10.3)
Chloride: 102 mmol/L (ref 98–111)
Creatinine, Ser: 1.75 mg/dL — ABNORMAL HIGH (ref 0.44–1.00)
GFR, Estimated: 27 mL/min — ABNORMAL LOW (ref >60)
Glucose, Bld: 115 mg/dL — ABNORMAL HIGH (ref 70–99)
Potassium: 4.6 mmol/L (ref 3.5–5.1)
Sodium: 140 mmol/L (ref 135–145)

## 2021-09-11 MED ORDER — LEVALBUTEROL TARTRATE 45 MCG/ACT IN AERO: 1.0000 | INHALATION_SPRAY | RESPIRATORY_TRACT | 2 refills | Status: DC | PRN

## 2021-09-11 MED ORDER — GUAIFENESIN ER 600 MG PO TB12: 600.0000 mg | ORAL_TABLET | Freq: Two times a day (BID) | ORAL | 0 refills | Status: AC

## 2021-09-11 MED ORDER — EMPAGLIFLOZIN 10 MG PO TABS: 10.0000 mg | ORAL_TABLET | Freq: Every day | ORAL | 0 refills | Status: DC

## 2021-09-11 MED ORDER — FUROSEMIDE 40 MG PO TABS: 40.0000 mg | ORAL_TABLET | Freq: Every day | ORAL | 0 refills | Status: DC | PRN

## 2021-09-11 NOTE — Progress Notes (Signed)
Mobility Specialist Progress Note:   09/11/21 0932  Mobility  Activity Transferred from bed to chair  Level of Assistance Standby assist, set-up cues, supervision of patient - no hands on  Assistive Device Front wheel walker;Wheelchair  Distance Ambulated (ft) 2 ft  Activity Response Tolerated well  $Mobility charge 1 Mobility   Pt received EOB. NT needing help transferring to wheelchair for discharge. No complaints of pain. Left in wheelchair with all needs met.   [facility] Countess Biebel Mobility Specialist

## 2021-09-11 NOTE — TOC Transition Note (Signed)
Transition of Care [facility]) - CM/SW Discharge Note   Patient Details  Name: Kelsey Salinas MRN: [id number] Date of Birth: 11-24-1926  Transition of Care [facility]) CM/SW Contact:  Zenon Mayo, RN Phone Number: 09/11/2021, 8:45 AM   Clinical Narrative:    Patient is for dc today, she is from [place], daughter to transport her back at dc.  She has 24 hr aide care at [facility]          Patient Goals and CMS Choice        Discharge Placement                       Discharge Plan and Services                                     Social Determinants of Health (SDOH) Interventions     Readmission Risk Interventions     No data to display

## 2021-09-11 NOTE — Telephone Encounter (Signed)
Ok by me, I met her in the hospital.

## 2021-09-11 NOTE — Discharge Summary (Addendum)
Physician Discharge Summary  Kelsey Salinas ZOX:[id number] DOB: 05-01-1926 DOA: 09/09/2021  PCP: Virgie Dad, MD  Admit date: 09/09/2021 Discharge date: 09/11/2021  Time spent: 35 minutes  Recommendations for Outpatient Follow-up:  Cardiology Dr. Harrell Gave on 9/6, please check BMP at follow-up Caution started on [name], discontinue this if she develops recurrent urinary infections   Discharge Diagnoses:  Principal Problem:   Acute on chronic heart failure with preserved ejection fraction (HFpEF) (HCC) URI Emphysema noted on imaging   CKD (chronic kidney disease) stage 3, GFR 30-59 ml/min (HCC)   Essential hypertension   Memory loss, short term   Paroxysmal atrial tachycardia (HCC)   Diastolic dysfunction   Acute cough   Discharge Condition: Improved  Diet recommendation: Low sodium, heart healthy  Filed Weights   09/09/21 2143 09/10/21 0703 09/11/21 0531  Weight: 82.2 kg 81.8 kg 81 kg    History of present illness:  88/F with history of chronic diastolic CHF, hypertension, CKD 3b presented to the ED with cough dyspnea and an irregular heart heart rate  Hospital Course:   Acute on chronic diastolic CHF  -Clinically improving, diuresed with low-dose IV Lasix on admission, 2 L negative, respiratory status improved, weaned off oxygen -2D echo noted preserved EF, grade 1 diastolic dysfunction, preserved RV function and mildly elevated PAH -Seen by cardiology in consultation, not felt to be volume overloaded anymore, we discontinued her HCTZ and she was started on [name] yesterday, discussed risk of urinary infection, family will monitor for this -Cozaar continued, discharged home on Lasix as needed -Cannot rule out mild component of URI as well, emphysema noted on imaging, remote history of smoking and secondhand exposure, COVID and respiratory virus panel were negative, antibiotics not felt to be indicated, Xopenex inhaler prescribed instead of albuterol on account  of intermittent tachycardia -She is on a high dose of Toprol for atrial tachycardia, this was continued -She has a follow-up arranged with Dr. Harrell Gave on 9/6   CKD (chronic kidney disease) stage 3, GFR 30-59 ml/min (HCC) -Mild bump in creatinine after IV Lasix, diuretics discontinued, discharged home on as needed, cards recommended to continue current dose of losartan at discharge -Needs BMP in 1 week   Hypokalemia -replaced   Memory loss, short term Cont aricept + namenda   Essential hypertension -Continue beta-blocker, losartan -Discontinued HCTZ    Consultations: Cardiology  Discharge Exam: Vitals:   09/11/21 0531 09/11/21 0759  BP: 134/81 (!) 150/82  Pulse: (!) 102 65  Resp: 20 15  Temp: 98 F (36.7 C)   SpO2: 90% 95%    Gen: Awake, Alert, Oriented X 2, mild memory deficits HEENT: no JVD Lungs: Good air movement bilaterally, CTAB CVS: S1S2/RRR Abd: soft, Non tender, non distended, BS present Extremities: No edema Skin: no new rashes on exposed skin   Discharge Instructions   Discharge Instructions     Diet - low sodium heart healthy   Complete by: As directed    Diet - low sodium heart healthy   Complete by: As directed    Increase activity slowly   Complete by: As directed    Increase activity slowly   Complete by: As directed       Allergies as of 09/11/2021       Reactions   Amlodipine Besylate Other (See Comments)   Edema   Lisinopril Cough   Cough   Sulfamethoxazole-trimethoprim Other (See Comments)   Unknown reaction        Medication List  STOP taking these medications    hydrochlorothiazide 25 MG tablet Commonly known as: HYDRODIURIL       TAKE these medications    acetaminophen 500 MG tablet Commonly known as: TYLENOL Take 500 mg by mouth every 6 (six) hours as needed for mild pain, headache or fever.   alendronate 70 MG tablet Commonly known as: FOSAMAX Take 70 mg by mouth once a week. Take with a full glass  of water on an empty stomach.   atorvastatin 40 MG tablet Commonly known as: LIPITOR TAKE 1 TABLET ONCE DAILY.   cyanocobalamin 1000 MCG tablet Commonly known as: VITAMIN B12 Take 1 tablet (1,000 mcg total) by mouth daily.   donepezil 10 MG tablet Commonly known as: ARICEPT TAKE ONE TABLET BY MOUTH AT BEDTIME   empagliflozin 10 MG Tabs tablet Commonly known as: [name] Take 1 tablet (10 mg total) by mouth daily before breakfast.   escitalopram 5 MG tablet Commonly known as: LEXAPRO Take 5 mg by mouth daily.   famotidine 20 MG tablet Commonly known as: PEPCID Take 20 mg by mouth daily.   fexofenadine 180 MG tablet Commonly known as: ALLEGRA Take 1 tablet (180 mg total) by mouth daily.   furosemide 40 MG tablet Commonly known as: Lasix Take 1 tablet (40 mg total) by mouth daily as needed for fluid (for weight gain, 3lbs in 1 day or 5lbs in 1 week).   guaiFENesin 600 MG 12 hr tablet Commonly known as: MUCINEX Take 1 tablet (600 mg total) by mouth 2 (two) times daily for 7 days.   levalbuterol 45 MCG/ACT inhaler Commonly known as: XOPENEX HFA Inhale 1 puff into the lungs every 4 (four) hours as needed for wheezing.   losartan 100 MG tablet Commonly known as: COZAAR TAKE 1 TABLET DAILY WITH HCTZ TIL COMBINATION PILL AVAILABLE. What changed: See the new instructions.   memantine 10 MG tablet Commonly known as: NAMENDA TAKE 1 TABLET BY MOUTH TWICE DAILY.   metoprolol succinate 100 MG 24 hr tablet Commonly known as: TOPROL-XL TAKE 1&1/2 TABLETS TWICE DAILY. What changed:  how much to take how to take this when to take this additional instructions   polyethylene glycol 17 g packet Commonly known as: MIRALAX / GLYCOLAX Take 17 g by mouth daily.   sodium chloride 0.65 % Soln nasal spray Commonly known as: OCEAN Place 1 spray into both nostrils as needed for congestion.   triamcinolone 55 MCG/ACT Aero nasal inhaler Commonly known as: NASACORT Place 2 sprays  into the nose daily.   Voltaren 1 % Gel Generic drug: diclofenac Sodium Apply 2 g topically 4 (four) times daily.       Allergies  Allergen Reactions   Amlodipine Besylate Other (See Comments)    Edema   Lisinopril Cough    Cough    Sulfamethoxazole-Trimethoprim Other (See Comments)    Unknown reaction       The results of significant diagnostics from this hospitalization (including imaging, microbiology, ancillary and laboratory) are listed below for reference.    Significant Diagnostic Studies: ECHOCARDIOGRAM COMPLETE  Result Date: 09/10/2021    ECHOCARDIOGRAM REPORT   Patient Name:   Kelsey Salinas Date of Exam: 09/10/2021 Medical Rec #:  [id number]       Height:       68.0 in Accession #:    [id number]      Weight:       180.3 lb Date of Birth:  1926/10/20       BSA:  1.956 m Patient Age:    60 years        BP:           119/64 mmHg Patient Gender: F               HR:           111 bpm. Exam Location:  Outpatient Procedure: 2D Echo Indications:    CHF Acute Diastole  History:        Patient has prior history of Echocardiogram examinations, most                 recent 11/03/2011. Acute Heart Failure with preserved EF,                 Arrythmias:Tachycardia; Risk Factors:Hypertension and Diabetes.  Sonographer:    Harvie Junior Referring Phys: [phone] JARED M GARDNER  Sonographer Comments: Patient agitated; unable to turn on left side. IMPRESSIONS  1. Left ventricular ejection fraction, by estimation, is 55 to 60%. The left ventricle has normal function. The left ventricle has no regional wall motion abnormalities. There is mild left ventricular hypertrophy. Left ventricular diastolic parameters are consistent with Grade I diastolic dysfunction (impaired relaxation).  2. Right ventricular systolic function is normal. The right ventricular size is normal. There is mildly elevated pulmonary artery systolic pressure.  3. The mitral valve is normal in structure. No evidence of mitral  valve regurgitation. No evidence of mitral stenosis.  4. The aortic valve is tricuspid. Aortic valve regurgitation is trivial. No aortic stenosis is present.  5. The inferior vena cava is normal in size with greater than 50% respiratory variability, suggesting right atrial pressure of 3 mmHg. FINDINGS  Left Ventricle: Left ventricular ejection fraction, by estimation, is 55 to 60%. The left ventricle has normal function. The left ventricle has no regional wall motion abnormalities. The left ventricular internal cavity size was normal in size. There is  mild left ventricular hypertrophy. Left ventricular diastolic parameters are consistent with Grade I diastolic dysfunction (impaired relaxation). Indeterminate filling pressures. Right Ventricle: The right ventricular size is normal. No increase in right ventricular wall thickness. Right ventricular systolic function is normal. There is mildly elevated pulmonary artery systolic pressure. The tricuspid regurgitant velocity is 3.05  m/s, and with an assumed right atrial pressure of 3 mmHg, the estimated right ventricular systolic pressure is 11.9 mmHg. Left Atrium: Left atrial size was normal in size. Right Atrium: Right atrial size was normal in size. Pericardium: There is no evidence of pericardial effusion. Mitral Valve: The mitral valve is normal in structure. No evidence of mitral valve regurgitation. No evidence of mitral valve stenosis. Tricuspid Valve: The tricuspid valve is normal in structure. Tricuspid valve regurgitation is trivial. No evidence of tricuspid stenosis. Aortic Valve: The aortic valve is tricuspid. Aortic valve regurgitation is trivial. No aortic stenosis is present. Aortic valve mean gradient measures 3.0 mmHg. Aortic valve peak gradient measures 4.0 mmHg. Aortic valve area, by VTI measures 3.07 cm. Pulmonic Valve: The pulmonic valve was normal in structure. Pulmonic valve regurgitation is mild. No evidence of pulmonic stenosis. Aorta: The  aortic root is normal in size and structure. Venous: The inferior vena cava is normal in size with greater than 50% respiratory variability, suggesting right atrial pressure of 3 mmHg. IAS/Shunts: No atrial level shunt detected by color flow Doppler.  LEFT VENTRICLE PLAX 2D LVIDd:         3.20 cm     Diastology LVIDs:  2.40 cm     LV e' medial:    5.11 cm/s LV PW:         1.00 cm     LV E/e' medial:  12.1 LV IVS:        1.10 cm     LV e' lateral:   6.09 cm/s LVOT diam:     2.00 cm     LV E/e' lateral: 10.1 LV SV:         52 LV SV Index:   27 LVOT Area:     3.14 cm  LV Volumes (MOD) LV vol d, MOD A2C: 48.6 ml LV vol d, MOD A4C: 75.4 ml LV vol s, MOD A2C: 23.4 ml LV vol s, MOD A4C: 32.4 ml LV SV MOD A2C:     25.2 ml LV SV MOD A4C:     75.4 ml LV SV MOD BP:      34.2 ml RIGHT VENTRICLE RV Basal diam:  2.80 cm RV Mid diam:    2.70 cm RV S prime:     18.40 cm/s TAPSE (M-mode): 1.8 cm LEFT ATRIUM             Index        RIGHT ATRIUM           Index LA diam:        3.40 cm 1.74 cm/m   RA Area:     13.30 cm LA Vol (A2C):   55.0 ml 28.12 ml/m  RA Volume:   31.50 ml  16.11 ml/m LA Vol (A4C):   46.4 ml 23.72 ml/m LA Biplane Vol: 52.2 ml 26.69 ml/m  AORTIC VALVE                    PULMONIC VALVE AV Area (Vmax):    2.58 cm     PV Vmax:          0.79 m/s AV Area (Vmean):   2.32 cm     PV Peak grad:     2.5 mmHg AV Area (VTI):     3.07 cm     PR End Diast Vel: 4.58 msec AV Vmax:           100.00 cm/s AV Vmean:          76.900 cm/s AV VTI:            0.170 m AV Peak Grad:      4.0 mmHg AV Mean Grad:      3.0 mmHg LVOT Vmax:         82.20 cm/s LVOT Vmean:        56.900 cm/s LVOT VTI:          0.166 m LVOT/AV VTI ratio: 0.98  AORTA Ao Root diam: 3.60 cm Ao Asc diam:  3.40 cm MITRAL VALVE               TRICUSPID VALVE MV Area (PHT): 2.07 cm    TR Peak grad:   37.2 mmHg MV Decel Time: 366 msec    TR Vmax:        305.00 cm/s MV E velocity: 61.70 cm/s MV A velocity: 92.50 cm/s  SHUNTS MV E/A ratio:  0.67         Systemic VTI:  0.17 m                            Systemic Diam: 2.00 cm Tiffany  Oval Linsey MD Electronically signed by Skeet Latch MD Signature Date/Time: 09/10/2021/5:20:45 PM    Final    CT Angio Chest PE W/Cm &/Or Wo Cm  Result Date: 09/09/2021 CLINICAL DATA:  Hypoxia EXAM: CT ANGIOGRAPHY CHEST WITH CONTRAST TECHNIQUE: Multidetector CT imaging of the chest was performed using the standard protocol during bolus administration of intravenous contrast. Multiplanar CT image reconstructions and MIPs were obtained to evaluate the vascular anatomy. RADIATION DOSE REDUCTION: This exam was performed according to the departmental dose-optimization program which includes automated exposure control, adjustment of the mA and/or kV according to patient size and/or use of iterative reconstruction technique. CONTRAST:  49m OMNIPAQUE IOHEXOL 350 MG/ML SOLN COMPARISON:  Chest x-ray 09/09/2021 8 FINDINGS: Cardiovascular: Satisfactory opacification of the pulmonary arteries to the segmental level. No evidence of pulmonary embolism. Moderate aortic atherosclerosis. No aneurysm. Coronary vascular calcification. Normal cardiac size. No pericardial effusion Mediastinum/Nodes: Midline trachea. No thyroid mass. No suspicious lymph nodes. Esophagus within normal limits. Lungs/Pleura: Emphysema. No pleural effusion. Mild hazy pulmonary density within the right greater than left lung fields. No significant effusion or pneumothorax. Upper Abdomen: No acute abnormality. Musculoskeletal: No chest wall abnormality. No acute or significant osseous findings. Review of the MIP images confirms the above findings. IMPRESSION: 1. Negative for acute pulmonary embolus. 2. Emphysema 3. Slight hazy right greater than left pulmonary density, could be due to diffuse atelectasis or minimal asymmetric edema. Aortic Atherosclerosis (ICD10-I70.0) and Emphysema (ICD10-J43.9). Electronically Signed   By: KDonavan FoilM.D.   On: 09/09/2021 19:17   DG  Chest Portable 1 View  Result Date: 09/09/2021 CLINICAL DATA:  Shortness of breath EXAM: PORTABLE CHEST 1 VIEW COMPARISON:  12/09/2020 FINDINGS: Poor inspiration. Cardiomegaly. Aortic atherosclerosis and tortuosity. Increased interstitial markings that could represent early interstitial edema. No consolidation or lobar collapse. No effusion. IMPRESSION: Cardiomegaly and aortic atherosclerosis. Possible early interstitial edema. Electronically Signed   By: MNelson ChimesM.D.   On: 09/09/2021 15:12    Microbiology: Recent Results (from the past 240 hour(s))  SARS Coronavirus 2 by RT PCR ([facility]) *cepheid single result test* Anterior Nasal Swab     Status: None   Collection Time: 09/09/21  2:44 PM   Specimen: Anterior Nasal Swab  Result Value Ref Range Status   SARS Coronavirus 2 by RT PCR NEGATIVE NEGATIVE Final    Comment: (NOTE) SARS-CoV-2 target nucleic acids are NOT DETECTED.  The SARS-CoV-2 RNA is generally detectable in upper and lower respiratory specimens during the acute phase of infection. The lowest concentration of SARS-CoV-2 viral copies this assay can detect is 250 copies / mL. A negative result does not preclude SARS-CoV-2 infection and should not be used as the sole basis for treatment or other patient management decisions.  A negative result may occur with improper specimen collection / handling, submission of specimen other than nasopharyngeal swab, presence of viral mutation(s) within the areas targeted by this assay, and inadequate number of viral copies (<250 copies / mL). A negative result must be combined with clinical observations, patient history, and epidemiological information.  Fact Sheet for Patients:   hhttps://www.patel.info/ Fact Sheet for Healthcare Providers: hhttps://hall.com/ This test is not yet approved or  cleared by the [place] and has been  authorized for detection and/or diagnosis of SARS-CoV-2 by FDA under an Emergency Use Authorization (EUA).  This EUA will remain in effect (meaning this test can be used) for the duration of the COVID-19 declaration under Section  564(b)(1) of the Act, 21 U.S.C. section 360bbb-3(b)(1), unless the authorization is terminated or revoked sooner.  Performed at [facility], [place] [address] [place], [place] [address]   Respiratory (~20 pathogens) panel by PCR     Status: None   Collection Time: 09/10/21 11:43 AM   Specimen: Nasopharyngeal Swab; Respiratory  Result Value Ref Range Status   Adenovirus NOT DETECTED NOT DETECTED Final   Coronavirus 229E NOT DETECTED NOT DETECTED Final    Comment: (NOTE) The Coronavirus on the Respiratory Panel, DOES NOT test for the novel  Coronavirus (2019 nCoV)    Coronavirus HKU1 NOT DETECTED NOT DETECTED Final   Coronavirus NL63 NOT DETECTED NOT DETECTED Final   Coronavirus OC43 NOT DETECTED NOT DETECTED Final   Metapneumovirus NOT DETECTED NOT DETECTED Final   Rhinovirus / Enterovirus NOT DETECTED NOT DETECTED Final   Influenza A NOT DETECTED NOT DETECTED Final   Influenza B NOT DETECTED NOT DETECTED Final   Parainfluenza Virus 1 NOT DETECTED NOT DETECTED Final   Parainfluenza Virus 2 NOT DETECTED NOT DETECTED Final   Parainfluenza Virus 3 NOT DETECTED NOT DETECTED Final   Parainfluenza Virus 4 NOT DETECTED NOT DETECTED Final   Respiratory Syncytial Virus NOT DETECTED NOT DETECTED Final   Bordetella pertussis NOT DETECTED NOT DETECTED Final   Bordetella Parapertussis NOT DETECTED NOT DETECTED Final   Chlamydophila pneumoniae NOT DETECTED NOT DETECTED Final   Mycoplasma pneumoniae NOT DETECTED NOT DETECTED Final    Comment: Performed at [facility], [place]. [address], [place], [place] [address]     Labs: Basic Metabolic Panel: Recent Labs  Lab 09/09/21 1443 09/10/21 0322 09/11/21 0435  NA 140 140 140  K 4.4 3.4* 4.6  CL 103 102 102   CO2 '24 26 26  '$ GLUCOSE 105* 131* 115*  BUN 24* 24* 28*  CREATININE 1.31* 1.33* 1.75*  CALCIUM 9.3 9.0 9.1   Liver Function Tests: No results for input(s): "AST", "ALT", "ALKPHOS", "BILITOT", "PROT", "ALBUMIN" in the last 168 hours. No results for input(s): "LIPASE", "AMYLASE" in the last 168 hours. No results for input(s): "AMMONIA" in the last 168 hours. CBC: Recent Labs  Lab 09/09/21 1443  WBC 9.0  HGB 13.7  HCT 42.2  MCV 93.8  PLT 253   Cardiac Enzymes: No results for input(s): "CKTOTAL", "CKMB", "CKMBINDEX", "TROPONINI" in the last 168 hours. BNP: BNP (last 3 results) Recent Labs    12/09/20 2040 09/09/21 1443  BNP 104.6* 349.3*    ProBNP (last 3 results) No results for input(s): "PROBNP" in the last 8760 hours.  CBG: No results for input(s): "GLUCAP" in the last 168 hours.     Signed:  Domenic Polite MD.  [facility] 09/11/2021, 11:48 AM

## 2021-09-11 NOTE — Telephone Encounter (Signed)
New Message:    Patient would like to switch from Dr Tamala Julian to Dr Harrell Gave please. She would like switch , because [facility] is much closer for her. Is this alright with you both?

## 2021-09-11 NOTE — Progress Notes (Signed)
Rounding Note    Patient Name: Kelsey Salinas Date of Encounter: 09/11/2021  [place] Cardiologist: Sinclair Grooms, MD   Subjective   Doing well today. Asking to go home. Daughter and Dr. Broadus John in the room during my evaluation, discussed plan, all questions answered.  Inpatient Medications    Scheduled Meds:  atorvastatin  40 mg Oral Daily   cyanocobalamin  1,000 mcg Oral Daily   diclofenac Sodium  2 g Topical QID   donepezil  10 mg Oral QHS   enoxaparin (LOVENOX) injection  40 mg Subcutaneous Q24H   escitalopram  5 mg Oral Daily   famotidine  20 mg Oral Daily   guaiFENesin  600 mg Oral BID   loratadine  10 mg Oral Daily   memantine  10 mg Oral BID   metoprolol succinate  150 mg Oral BID   pantoprazole  40 mg Oral Q1200   polyethylene glycol  17 g Oral Daily   sodium chloride flush  3 mL Intravenous Q12H   triamcinolone  2 spray Nasal Daily   Continuous Infusions:  sodium chloride     PRN Meds: sodium chloride, acetaminophen, ondansetron (ZOFRAN) IV, mouth rinse, sodium chloride, sodium chloride flush   Vital Signs    Vitals:   09/10/21 2325 09/11/21 0000 09/11/21 0030 09/11/21 0531  BP: 138/68 136/68 136/68 134/81  Pulse: 65 63 65 (!) 102  Resp: (!) 22  (!) 26 20  Temp: 98.1 F (36.7 C) 98.1 F (36.7 C) 98.1 F (36.7 C) 98 F (36.7 C)  TempSrc:  Oral  Oral  SpO2:  92%  90%  Weight:    81 kg  Height:        Intake/Output Summary (Last 24 hours) at 09/11/2021 0848 Last data filed at 09/11/2021 0533 Gross per 24 hour  Intake 243 ml  Output 1100 ml  Net -857 ml      09/11/2021    5:31 AM 09/10/2021    7:03 AM 09/09/2021    9:43 PM  Last 3 Weights  Weight (lbs) 178 lb 9.2 oz 180 lb 5.4 oz 181 lb 4.8 oz  Weight (kg) 81 kg 81.8 kg 82.237 kg      Telemetry    Paroxysmal atrial tachycardia to 115 with sinus rhythm otherwise - Personally Reviewed  ECG    No new - Personally Reviewed  Physical Exam   GEN: No acute distress.   Neck:  No JVD Cardiac: RRR, no murmurs, rubs, or gallops.  Respiratory: Mild wheezing in R upper lobe, no crackles, mild mucus rattle with cough GI: Soft, nontender, non-distended  MS: No edema; No deformity. Neuro:  Nonfocal  Psych: Normal affect   Labs    High Sensitivity Troponin:   Recent Labs  Lab 09/09/21 1443 09/09/21 1713  TROPONINIHS 21* 19*     Chemistry Recent Labs  Lab 09/09/21 1443 09/10/21 0322 09/11/21 0435  NA 140 140 140  K 4.4 3.4* 4.6  CL 103 102 102  CO2 '24 26 26  '$ GLUCOSE 105* 131* 115*  BUN 24* 24* 28*  CREATININE 1.31* 1.33* 1.75*  CALCIUM 9.3 9.0 9.1  GFRNONAA 38* 37* 27*  ANIONGAP '13 12 12    '$ Lipids No results for input(s): "CHOL", "TRIG", "HDL", "LABVLDL", "LDLCALC", "CHOLHDL" in the last 168 hours.  Hematology Recent Labs  Lab 09/09/21 1443  WBC 9.0  RBC 4.50  HGB 13.7  HCT 42.2  MCV 93.8  MCH 30.4  MCHC 32.5  RDW  13.2  PLT 253   Thyroid  Recent Labs  Lab 09/10/21 0322  TSH 0.468    BNP Recent Labs  Lab 09/09/21 1443  BNP 349.3*    DDimer  Recent Labs  Lab 09/09/21 1443  DDIMER 1.45*     Radiology    ECHOCARDIOGRAM COMPLETE  Result Date: 09/10/2021    ECHOCARDIOGRAM REPORT   Patient Name:   Kelsey Salinas Date of Exam: 09/10/2021 Medical Rec #:  [id number]       Height:       68.0 in Accession #:    [id number]      Weight:       180.3 lb Date of Birth:  03-Oct-1926       BSA:          1.956 m Patient Age:    88 years        BP:           119/64 mmHg Patient Gender: F               HR:           111 bpm. Exam Location:  Outpatient Procedure: 2D Echo Indications:    CHF Acute Diastole  History:        Patient has prior history of Echocardiogram examinations, most                 recent 11/03/2011. Acute Heart Failure with preserved EF,                 Arrythmias:Tachycardia; Risk Factors:Hypertension and Diabetes.  Sonographer:    Harvie Junior Referring Phys: [phone] JARED M GARDNER  Sonographer Comments: Patient agitated; unable to  turn on left side. IMPRESSIONS  1. Left ventricular ejection fraction, by estimation, is 55 to 60%. The left ventricle has normal function. The left ventricle has no regional wall motion abnormalities. There is mild left ventricular hypertrophy. Left ventricular diastolic parameters are consistent with Grade I diastolic dysfunction (impaired relaxation).  2. Right ventricular systolic function is normal. The right ventricular size is normal. There is mildly elevated pulmonary artery systolic pressure.  3. The mitral valve is normal in structure. No evidence of mitral valve regurgitation. No evidence of mitral stenosis.  4. The aortic valve is tricuspid. Aortic valve regurgitation is trivial. No aortic stenosis is present.  5. The inferior vena cava is normal in size with greater than 50% respiratory variability, suggesting right atrial pressure of 3 mmHg. FINDINGS  Left Ventricle: Left ventricular ejection fraction, by estimation, is 55 to 60%. The left ventricle has normal function. The left ventricle has no regional wall motion abnormalities. The left ventricular internal cavity size was normal in size. There is  mild left ventricular hypertrophy. Left ventricular diastolic parameters are consistent with Grade I diastolic dysfunction (impaired relaxation). Indeterminate filling pressures. Right Ventricle: The right ventricular size is normal. No increase in right ventricular wall thickness. Right ventricular systolic function is normal. There is mildly elevated pulmonary artery systolic pressure. The tricuspid regurgitant velocity is 3.05  m/s, and with an assumed right atrial pressure of 3 mmHg, the estimated right ventricular systolic pressure is 38.1 mmHg. Left Atrium: Left atrial size was normal in size. Right Atrium: Right atrial size was normal in size. Pericardium: There is no evidence of pericardial effusion. Mitral Valve: The mitral valve is normal in structure. No evidence of mitral valve regurgitation.  No evidence of mitral valve stenosis. Tricuspid Valve: The tricuspid valve is normal in structure.  Tricuspid valve regurgitation is trivial. No evidence of tricuspid stenosis. Aortic Valve: The aortic valve is tricuspid. Aortic valve regurgitation is trivial. No aortic stenosis is present. Aortic valve mean gradient measures 3.0 mmHg. Aortic valve peak gradient measures 4.0 mmHg. Aortic valve area, by VTI measures 3.07 cm. Pulmonic Valve: The pulmonic valve was normal in structure. Pulmonic valve regurgitation is mild. No evidence of pulmonic stenosis. Aorta: The aortic root is normal in size and structure. Venous: The inferior vena cava is normal in size with greater than 50% respiratory variability, suggesting right atrial pressure of 3 mmHg. IAS/Shunts: No atrial level shunt detected by color flow Doppler.  LEFT VENTRICLE PLAX 2D LVIDd:         3.20 cm     Diastology LVIDs:         2.40 cm     LV e' medial:    5.11 cm/s LV PW:         1.00 cm     LV E/e' medial:  12.1 LV IVS:        1.10 cm     LV e' lateral:   6.09 cm/s LVOT diam:     2.00 cm     LV E/e' lateral: 10.1 LV SV:         52 LV SV Index:   27 LVOT Area:     3.14 cm  LV Volumes (MOD) LV vol d, MOD A2C: 48.6 ml LV vol d, MOD A4C: 75.4 ml LV vol s, MOD A2C: 23.4 ml LV vol s, MOD A4C: 32.4 ml LV SV MOD A2C:     25.2 ml LV SV MOD A4C:     75.4 ml LV SV MOD BP:      34.2 ml RIGHT VENTRICLE RV Basal diam:  2.80 cm RV Mid diam:    2.70 cm RV S prime:     18.40 cm/s TAPSE (M-mode): 1.8 cm LEFT ATRIUM             Index        RIGHT ATRIUM           Index LA diam:        3.40 cm 1.74 cm/m   RA Area:     13.30 cm LA Vol (A2C):   55.0 ml 28.12 ml/m  RA Volume:   31.50 ml  16.11 ml/m LA Vol (A4C):   46.4 ml 23.72 ml/m LA Biplane Vol: 52.2 ml 26.69 ml/m  AORTIC VALVE                    PULMONIC VALVE AV Area (Vmax):    2.58 cm     PV Vmax:          0.79 m/s AV Area (Vmean):   2.32 cm     PV Peak grad:     2.5 mmHg AV Area (VTI):     3.07 cm     PR End  Diast Vel: 4.58 msec AV Vmax:           100.00 cm/s AV Vmean:          76.900 cm/s AV VTI:            0.170 m AV Peak Grad:      4.0 mmHg AV Mean Grad:      3.0 mmHg LVOT Vmax:         82.20 cm/s LVOT Vmean:        56.900 cm/s LVOT VTI:  0.166 m LVOT/AV VTI ratio: 0.98  AORTA Ao Root diam: 3.60 cm Ao Asc diam:  3.40 cm MITRAL VALVE               TRICUSPID VALVE MV Area (PHT): 2.07 cm    TR Peak grad:   37.2 mmHg MV Decel Time: 366 msec    TR Vmax:        305.00 cm/s MV E velocity: 61.70 cm/s MV A velocity: 92.50 cm/s  SHUNTS MV E/A ratio:  0.67        Systemic VTI:  0.17 m                            Systemic Diam: 2.00 cm Skeet Latch MD Electronically signed by Skeet Latch MD Signature Date/Time: 09/10/2021/5:20:45 PM    Final    CT Angio Chest PE W/Cm &/Or Wo Cm  Result Date: 09/09/2021 CLINICAL DATA:  Hypoxia EXAM: CT ANGIOGRAPHY CHEST WITH CONTRAST TECHNIQUE: Multidetector CT imaging of the chest was performed using the standard protocol during bolus administration of intravenous contrast. Multiplanar CT image reconstructions and MIPs were obtained to evaluate the vascular anatomy. RADIATION DOSE REDUCTION: This exam was performed according to the departmental dose-optimization program which includes automated exposure control, adjustment of the mA and/or kV according to patient size and/or use of iterative reconstruction technique. CONTRAST:  17m OMNIPAQUE IOHEXOL 350 MG/ML SOLN COMPARISON:  Chest x-ray 09/09/2021 8 FINDINGS: Cardiovascular: Satisfactory opacification of the pulmonary arteries to the segmental level. No evidence of pulmonary embolism. Moderate aortic atherosclerosis. No aneurysm. Coronary vascular calcification. Normal cardiac size. No pericardial effusion Mediastinum/Nodes: Midline trachea. No thyroid mass. No suspicious lymph nodes. Esophagus within normal limits. Lungs/Pleura: Emphysema. No pleural effusion. Mild hazy pulmonary density within the right greater than left  lung fields. No significant effusion or pneumothorax. Upper Abdomen: No acute abnormality. Musculoskeletal: No chest wall abnormality. No acute or significant osseous findings. Review of the MIP images confirms the above findings. IMPRESSION: 1. Negative for acute pulmonary embolus. 2. Emphysema 3. Slight hazy right greater than left pulmonary density, could be due to diffuse atelectasis or minimal asymmetric edema. Aortic Atherosclerosis (ICD10-I70.0) and Emphysema (ICD10-J43.9). Electronically Signed   By: KDonavan FoilM.D.   On: 09/09/2021 19:17   DG Chest Portable 1 View  Result Date: 09/09/2021 CLINICAL DATA:  Shortness of breath EXAM: PORTABLE CHEST 1 VIEW COMPARISON:  12/09/2020 FINDINGS: Poor inspiration. Cardiomegaly. Aortic atherosclerosis and tortuosity. Increased interstitial markings that could represent early interstitial edema. No consolidation or lobar collapse. No effusion. IMPRESSION: Cardiomegaly and aortic atherosclerosis. Possible early interstitial edema. Electronically Signed   By: MNelson ChimesM.D.   On: 09/09/2021 15:12    Cardiac Studies   Echo 09/10/21  1. Left ventricular ejection fraction, by estimation, is 55 to 60%. The  left ventricle has normal function. The left ventricle has no regional  wall motion abnormalities. There is mild left ventricular hypertrophy.  Left ventricular diastolic parameters  are consistent with Grade I diastolic dysfunction (impaired relaxation).   2. Right ventricular systolic function is normal. The right ventricular  size is normal. There is mildly elevated pulmonary artery systolic  pressure.   3. The mitral valve is normal in structure. No evidence of mitral valve  regurgitation. No evidence of mitral stenosis.   4. The aortic valve is tricuspid. Aortic valve regurgitation is trivial.  No aortic stenosis is present.   5. The inferior vena cava is normal  in size with greater than 50%  respiratory variability, suggesting right atrial  pressure of 3 mmHg.   Patient Profile     88 y.o. female with reported chronic diastolic heart failure, hypertension, chronic kidney disease stage 3b who presented with cough, dyspnea and irregular heart rate.  Assessment & Plan    Shortness of breath/cough Chronic kidney disease stage 3b Mild diastolic dysfunction -she appears euvolemic. Only very mild diastolic dysfunction on echo. Right atrial pressure 3 mmHg -HCTZ held this admission. -given her CKD, mild diastolic dysfunction, and holding of HCTZ, we trialed [name] yesterday. She did well with this. We did discuss the risk of genital infection, they will watch for this. -would send with PRN lasix for weight gain -CT also showed atelectasis and emphysema. Slight wheezing on exam today. Wet cough. Suspect may have component of mild bronchitis, improving, defer to primary team -consider outpatient PFTS. Remote smoking history  Paroxysmal atrial tachycardia -see consult note. Asymptomatic. Plan to continue current metoprolol, we all agree amiodarone only if she becomes symptomatic  [place] will sign off.   Medication Recommendations:   Atorvastatin 40 mg daily Losartan 100 mg daily Metoprolol succinate 150 mg BID (chronic dose)  STOP HCTZ START [name] 10 mg daily. START Lasix 40 mg PRN daily for weight gain 3 lbs overnight or 5 lbs in a week  Other recommendations (labs, testing, etc):  check BMET in 1 week Follow up as an outpatient:  She has an appt with Ermalinda Barrios on 09/16/21. After that, she would like to transfer her care to me as Dr. Tamala Julian is retiring and she lives at [place], which is near the [employer] office (where I am). This is fine with me.  For questions or updates, please contact [place] Please consult [email or web address] for contact info under        Signed, Buford Dresser, MD  09/11/2021, 8:48 AM

## 2021-09-11 NOTE — Progress Notes (Addendum)
Heart Failure Navigator Progress Note  Assessed for Heart & Vascular TOC clinic readiness.  Patient has close follow up with [facility], appt made with for 9/6. Anticipated discharge today.  Kerby Nora, PharmD, BCPS Heart Failure Stewardship Pharmacist Phone [phone]

## 2021-09-15 DIAGNOSIS — E1122 Type 2 diabetes mellitus with diabetic chronic kidney disease: Secondary | ICD-10-CM | POA: Diagnosis not present

## 2021-09-15 DIAGNOSIS — E7800 Pure hypercholesterolemia, unspecified: Secondary | ICD-10-CM | POA: Diagnosis not present

## 2021-09-15 DIAGNOSIS — I10 Essential (primary) hypertension: Secondary | ICD-10-CM | POA: Diagnosis not present

## 2021-09-15 DIAGNOSIS — N183 Chronic kidney disease, stage 3 unspecified: Secondary | ICD-10-CM | POA: Diagnosis not present

## 2021-09-15 LAB — LIPID PANEL
Cholesterol: 189 (ref 0–200)
HDL: 49 (ref 35–70)
LDL Cholesterol: 103
Triglycerides: 188 — AB (ref 40–160)

## 2021-09-15 LAB — HEMOGLOBIN A1C: Hemoglobin A1C: 7.3

## 2021-09-15 LAB — CBC AND DIFFERENTIAL
HCT: 41 (ref 36–46)
Hemoglobin: 13.3 (ref 12.0–16.0)
Platelets: 246 10*3/uL (ref 150–400)
WBC: 7.9

## 2021-09-15 LAB — BASIC METABOLIC PANEL
BUN: 41 — AB (ref 4–21)
CO2: 23 — AB (ref 13–22)
Chloride: 102 (ref 99–108)
Creatinine: 1.5 — AB (ref 0.5–1.1)
Glucose: 114
Potassium: 4.5 mEq/L (ref 3.5–5.1)
Sodium: 138 (ref 137–147)

## 2021-09-15 LAB — CBC: RBC: 4.39 (ref 3.87–5.11)

## 2021-09-15 LAB — COMPREHENSIVE METABOLIC PANEL
Calcium: 9.1 (ref 8.7–10.7)
eGFR: 32

## 2021-09-16 ENCOUNTER — Ambulatory Visit: Payer: Medicare Other | Admitting: Physician Assistant

## 2021-09-18 ENCOUNTER — Encounter: Payer: Self-pay | Admitting: Pulmonary Disease

## 2021-09-18 ENCOUNTER — Encounter: Payer: Self-pay | Admitting: Internal Medicine

## 2021-09-18 ENCOUNTER — Ambulatory Visit (INDEPENDENT_AMBULATORY_CARE_PROVIDER_SITE_OTHER): Payer: Medicare Other | Admitting: Pulmonary Disease

## 2021-09-18 ENCOUNTER — Institutional Professional Consult (permissible substitution): Payer: Medicare Other | Admitting: Pulmonary Disease

## 2021-09-18 VITALS — BP 122/66 | HR 67 | Temp 97.6°F

## 2021-09-18 DIAGNOSIS — R053 Chronic cough: Secondary | ICD-10-CM

## 2021-09-18 DIAGNOSIS — J438 Other emphysema: Secondary | ICD-10-CM

## 2021-09-18 MED ORDER — TRELEGY ELLIPTA 200-62.5-25 MCG/ACT IN AEPB: 1.0000 | INHALATION_SPRAY | Freq: Every day | RESPIRATORY_TRACT | 5 refills | Status: DC

## 2021-09-18 MED ORDER — TRELEGY ELLIPTA 200-62.5-25 MCG/ACT IN AEPB: 1.0000 | INHALATION_SPRAY | Freq: Every day | RESPIRATORY_TRACT | 0 refills | Status: DC

## 2021-09-18 NOTE — Progress Notes (Signed)
Kelsey Salinas    [id number]    April 19, 1926  Primary Care Physician:Gupta, Rene Kocher, MD  Referring Physician: Virgie Dad, MD [address] [place],  [place] [address]  Chief complaint: Consult for emphysema  HPI: 88 y.o. who  has a past medical history of Abscess of right leg (12/24/2020), Childhood asthma, Chronic diastolic heart failure ([place]) (12/25/2012), Colon polyp, Diverticular stricture ([place]) (2009), Diverticulosis (10/30/2007), DM (diabetes mellitus), type 2 ([place]), Edema (09/12/2013), Fall (05/18/2017), Hypercholesterolemia, Hypertension, Hypertension, essential, Hypothyroidism, Indigestion, Insomnia, OA (osteoarthritis) of hip, Osteopenia, Pneumonia (1939), Rapid resting heart rate (12/25/2012), Right rotator cuff tear, and Seasonal allergies.   She was hospitalized in early September 2023 with acute on chronic heart failure with preserved ejection fraction.  Treated with IV Lasix but had a mild bump in creatinine.  Initially on 2 L oxygen which was weaned off at the time of discharge.  Cardiology was consulted who felt that there may be a component of lung disease given wheezing on examination and emphysema on CT scan.  She has been referred to pulmonary for further evaluation  She feels well post discharge.  Complains of mild cough with mucus production.  Pets: Used to have a dog Occupation: Retired travel agent Exposures: No mold, hot tub, Jacuzzi.  No feather pillows or comforters Smoking history: 60-pack-year smoker.  Quit in 2010 Travel history: Originally from [place].  No significant recent travel Relevant family history: No family history of lung disease   Outpatient Encounter Medications as of 09/18/2021  Medication Sig   acetaminophen (TYLENOL) 500 MG tablet Take 500 mg by mouth every 6 (six) hours as needed for mild pain, headache or fever.   alendronate (FOSAMAX) 70 MG tablet Take 70 mg by mouth once a week. Take with a full glass of water on an  empty stomach.   atorvastatin (LIPITOR) 40 MG tablet TAKE 1 TABLET ONCE DAILY. (Patient taking differently: Take 40 mg by mouth daily.)   diclofenac Sodium (VOLTAREN) 1 % GEL Apply 2 g topically 4 (four) times daily.   donepezil (ARICEPT) 10 MG tablet TAKE ONE TABLET BY MOUTH AT BEDTIME (Patient taking differently: Take 10 mg by mouth at bedtime.)   empagliflozin ([name]) 10 MG TABS tablet Take 1 tablet (10 mg total) by mouth daily before breakfast.   escitalopram (LEXAPRO) 5 MG tablet Take 5 mg by mouth daily.   famotidine (PEPCID) 20 MG tablet Take 20 mg by mouth daily.   fexofenadine (ALLEGRA) 180 MG tablet Take 1 tablet (180 mg total) by mouth daily.   furosemide (LASIX) 40 MG tablet Take 1 tablet (40 mg total) by mouth daily as needed for fluid (for weight gain, 3lbs in 1 day or 5lbs in 1 week).   guaiFENesin (MUCINEX) 600 MG 12 hr tablet Take 1 tablet (600 mg total) by mouth 2 (two) times daily for 7 days.   levalbuterol (XOPENEX HFA) 45 MCG/ACT inhaler Inhale 1 puff into the lungs every 4 (four) hours as needed for wheezing.   losartan (COZAAR) 100 MG tablet TAKE 1 TABLET DAILY WITH HCTZ TIL COMBINATION PILL AVAILABLE. (Patient taking differently: Take 100 mg by mouth daily. Take with HCTZ)   memantine (NAMENDA) 10 MG tablet TAKE 1 TABLET BY MOUTH TWICE DAILY. (Patient taking differently: Take 10 mg by mouth 2 (two) times daily.)   metoprolol succinate (TOPROL-XL) 100 MG 24 hr tablet TAKE 1&1/2 TABLETS TWICE DAILY. (Patient taking differently: Take 150 mg by mouth 2 (two)  times daily.)   polyethylene glycol (MIRALAX / GLYCOLAX) 17 g packet Take 17 g by mouth daily.   sodium chloride (OCEAN) 0.65 % SOLN nasal spray Place 1 spray into both nostrils as needed for congestion.   triamcinolone (NASACORT) 55 MCG/ACT AERO nasal inhaler Place 2 sprays into the nose daily.   vitamin B-12 (CYANOCOBALAMIN) 1000 MCG tablet Take 1 tablet (1,000 mcg total) by mouth daily.   No facility-administered  encounter medications on file as of 09/18/2021.    Allergies as of 09/18/2021 - Review Complete 09/18/2021  Allergen Reaction Noted   Amlodipine besylate Other (See Comments) 12/24/2012   Lisinopril Cough 12/24/2012   Sulfamethoxazole-trimethoprim Other (See Comments) 12/24/2012    Past Medical History:  Diagnosis Date   Abscess of right leg 12/24/2020   Childhood asthma    Chronic diastolic heart failure ([place]) 12/25/2012   October 2013 echocardiogram 1. There is moderate concentric left ventricle hypertrophy. 2. Left ventricular ejection fraction estimated by 2D at 60-65 percent. 3. There were no regional wall motion abnormalities. 4. Mild mitral annular calcification. 5. Mild mitral valve regurgitation. 6. There is mild tricuspid regurgitation. 7. Mildly elevated estimated right ventricular systolic pressure. 8. Right ventricular systolic pressure estimated at 35-40 mm Hg. 9. Mild calcification of the aortic valve. 10. Trace aortic valve regurgitation. 11. Analysis of mitral valve inflow, pulmonary vein Doppler and tissue Doppler suggests grade I diastolic dysfunction without elevated left atrial pressure.    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 Possible ectopic atrial tachycardia. Date entered 12/25/12    Right rotator cuff tear    Seasonal allergies     Past Surgical History:  Procedure Laterality Date   CATARACT EXTRACTION W/  INTRAOCULAR LENS  IMPLANT, BILATERAL Bilateral    COLONOSCOPY  2009   Dr. [facility]   DILATION AND CURETTAGE OF UTERUS     FRACTURE SURGERY     JOINT REPLACEMENT     ORIF WRIST FRACTURE Right    REDUCTION MAMMAPLASTY  1999   TOTAL HIP ARTHROPLASTY Right 03-04-08   Dr. Wynelle Link    Family History  Problem Relation Age of Onset   Heart disease Mother    Heart disease Father    Heart disease Brother     Social History   Socioeconomic History   Marital status: Married    Spouse name: Not on file   Number of children: Not on file   Years of education: Not on file   Highest education level: Not on file  Occupational History   Occupation: retired travel agent  Tobacco Use   Smoking status: Former    Packs/day: 0.12    Years: 50.00    Total pack years: 6.00    Types: Cigarettes   Smokeless tobacco: Never   Tobacco comments:    "stopped in the ate 1990s"  Vaping Use   Vaping Use: Never used  Substance and Sexual Activity   Alcohol use: Yes    Comment: rarely   Drug use: Never   Sexual activity: Not Currently  Other Topics Concern   Not on file  Social History Narrative   Lives at [facility] since 2010   Husband Ilona Sorrel married 1952 (husband in memory care unit at [employer])   Former smoker 1/2 PPD, for 30 years, stopped 01/10/1989   Alcohol 2 glasses of wine at night   Exercise: machines 3 times a week and daily walk   No POA/LW         Social Determinants of Health   Financial Resource Strain: Low Risk  (07/19/2017)   Overall Financial Resource Strain (CARDIA)    Difficulty of Paying Living Expenses: Not hard at all  Food Insecurity: No Food Insecurity (07/19/2017)   Hunger Vital Sign    Worried About Running Out of Food in the Last Year: Never true    [place] in the Last Year: Never true  Transportation Needs: No Transportation Needs (07/19/2017)   PRAPARE - Hydrologist (Medical): No    Lack of Transportation (Non-Medical): No   Physical Activity: Insufficiently Active (07/19/2017)   Exercise Vital Sign    Days of Exercise per Week: 7 days    Minutes of Exercise per Session: 20 min  Stress: Stress Concern Present (07/19/2017)   [place]    Feeling of Stress : To some extent  Social Connections: Socially Integrated (07/19/2017)   Social Connection and Isolation Panel [NHANES]    Frequency of Communication with Friends and Family: More than three times a week    Frequency of Social Gatherings with Friends and Family: More than three times a week    Attends Religious Services: More than 4 times per year    Active Member of [employer] or Organizations: Yes    Attends Archivist Meetings: Never    Marital Status: Married  Human resources officer Violence: Not At Risk (07/19/2017)   Humiliation, Afraid, Rape, and Kick questionnaire    Fear of Current or Ex-Partner: No    Emotionally Abused: No    Physically Abused: No    Sexually Abused: No    Review of systems: Review of Systems  Constitutional: Negative for fever and chills.  HENT: Negative.   Eyes: Negative for blurred vision.  Respiratory: as per HPI  Cardiovascular: Negative for chest pain and palpitations.  Gastrointestinal: Negative for vomiting, diarrhea, blood per rectum. Genitourinary: Negative for dysuria, urgency, frequency and hematuria.  Musculoskeletal: Negative for myalgias, back pain and joint pain.  Skin: Negative for itching and rash.  Neurological: Negative for dizziness, tremors, focal weakness, seizures and loss of consciousness.  Endo/Heme/Allergies: Negative for environmental allergies.  Psychiatric/Behavioral: Negative for depression, suicidal ideas and hallucinations.  All other systems reviewed and are negative.  Physical Exam: Blood pressure 122/66, pulse 67, temperature 97.6 F (36.4 C), temperature source Oral, SpO2 95 %. Gen:      No acute distress HEENT:  EOMI,  sclera anicteric Neck:     No masses; no thyromegaly Lungs:    Clear to auscultation bilaterally; normal respiratory effort CV:         Regular rate and rhythm; no murmurs Abd:      + bowel sounds; soft, non-tender; no palpable masses, no distension Ext:    No edema; adequate peripheral perfusion Skin:  Warm and dry; no rash Neuro: alert and oriented x 3 Psych: normal mood and affect  Data Reviewed: Imaging: CT angiogram 09/09/2021-negative for pulmonary embolism, moderate emphysema.  Hazy groundglass opacities in the left lung.  I have reviewed the images personally.  PFTs:  Labs: CBC 12/24/2020-WBC 8.4, eos 2%, absolute eosinophil count 168  Assessment:  Emphysema.   Noted on recent CTA that was obtained during hospitalization for heart failure.  There may have been a component of COPD exacerbation as well as wheezing was noted.  At present she appears to have recovered back to baseline  May have COPD given smoking history and presence of emphysema on CT scan We will get formal PFTs for further evaluation Her CTA also shows some mild interstitial opacities.  Ordered high-res CT for better evaluation and to rule out interstitial lung disease Trial of Trelegy inhaler  Plan/Recommendations: PFTs, high-res CT Trelegy inhaler  Marshell Garfinkel MD [facility] 09/18/2021, 10:43 AM  CC: Virgie Dad, MD

## 2021-09-18 NOTE — Patient Instructions (Signed)
We will start you on an inhaler called Trelegy 200.  We will give you a sample to get you started and send a prescription Schedule pulmonary function tests and high-resolution CT Follow-up in 1 to 2 months after PFTs.

## 2021-09-21 ENCOUNTER — Encounter: Payer: Medicare Other | Admitting: Adult Health

## 2021-09-22 ENCOUNTER — Encounter: Payer: Self-pay | Admitting: Internal Medicine

## 2021-09-22 ENCOUNTER — Ambulatory Visit (HOSPITAL_COMMUNITY): Payer: Medicare Other

## 2021-09-23 ENCOUNTER — Non-Acute Institutional Stay: Payer: Medicare Other | Admitting: Internal Medicine

## 2021-09-23 ENCOUNTER — Encounter: Payer: Medicare Other | Admitting: Internal Medicine

## 2021-09-23 ENCOUNTER — Encounter: Payer: Self-pay | Admitting: Internal Medicine

## 2021-09-23 VITALS — BP 132/86 | HR 92 | Temp 97.3°F | Ht 68.0 in | Wt 179.8 lb

## 2021-09-23 DIAGNOSIS — N1831 Chronic kidney disease, stage 3a: Secondary | ICD-10-CM | POA: Diagnosis not present

## 2021-09-23 DIAGNOSIS — E7800 Pure hypercholesterolemia, unspecified: Secondary | ICD-10-CM | POA: Diagnosis not present

## 2021-09-23 DIAGNOSIS — N1832 Chronic kidney disease, stage 3b: Secondary | ICD-10-CM

## 2021-09-23 DIAGNOSIS — I5033 Acute on chronic diastolic (congestive) heart failure: Secondary | ICD-10-CM | POA: Diagnosis not present

## 2021-09-23 DIAGNOSIS — I10 Essential (primary) hypertension: Secondary | ICD-10-CM

## 2021-09-23 DIAGNOSIS — E1122 Type 2 diabetes mellitus with diabetic chronic kidney disease: Secondary | ICD-10-CM

## 2021-09-23 DIAGNOSIS — M810 Age-related osteoporosis without current pathological fracture: Secondary | ICD-10-CM | POA: Diagnosis not present

## 2021-09-23 DIAGNOSIS — F330 Major depressive disorder, recurrent, mild: Secondary | ICD-10-CM | POA: Diagnosis not present

## 2021-09-23 DIAGNOSIS — J439 Emphysema, unspecified: Secondary | ICD-10-CM

## 2021-09-23 DIAGNOSIS — R413 Other amnesia: Secondary | ICD-10-CM

## 2021-09-23 DIAGNOSIS — I471 Supraventricular tachycardia: Secondary | ICD-10-CM | POA: Diagnosis not present

## 2021-09-23 NOTE — Progress Notes (Signed)
Location: [place] of Service:  Clinic (12)  Provider:   Code Status:  Goals of Care:     06/17/2021    8:40 AM  Advanced Directives  Does Patient Have a Medical Advance Directive? Yes  Type of Paramedic of [place];Living will  Does patient want to make changes to medical advance directive? No - Patient declined  Copy of [place] in Chart? Yes - validated most recent copy scanned in chart (See row information)     Chief Complaint  Patient presents with   Medical Management of Chronic Issues    3 month follow-up and discuss labs. Discuss need for eye exam, covid boosters, and flu vaccine or post pone if patient refuses. [facility] Verified.     HPI: Patient is a 88 y.o. female seen today for an acute visit for Follow up from the hospital  She was admitted in the Hospital from 08/30-09/01 for Acute on Chronic CHF  Patient has a history of Alzheimer's dementia, diastolic CHF, HLD, arthritis, history of osteoporosis, allergic rhinitis  Patient was sent to the hospital for increased cough and shortness of breath with tachycardia Her CT scan of the chest was negative for acute PE but showed asymmetric pulmonary edema.  Her BNP was elevated. Now on as needed Lasix.  Caregiver not able to use it due to difficulty following her weight Patient herself denies any shortness of breath.  She states she always has cough.  They do follow her pulse ox carefully.  Her son is a cardiologist who is also helping manage her medications Patient also seen by Dr. Vaughan Browner for possible ILD and emphysema he has ordered a repeat CT scan of the chest Patient was very upset and did not want CT.  Past Medical History:  Diagnosis Date   Abscess of right leg 12/24/2020   Childhood asthma    Chronic diastolic heart failure ([place]) 12/25/2012   October 2013 echocardiogram 1. There is moderate concentric left ventricle hypertrophy. 2. Left  ventricular ejection fraction estimated by 2D at 60-65 percent. 3. There were no regional wall motion abnormalities. 4. Mild mitral annular calcification. 5. Mild mitral valve regurgitation. 6. There is mild tricuspid regurgitation. 7. Mildly elevated estimated right ventricular systolic pressure. 8. Right ventricular systolic pressure estimated at 35-40 mm Hg. 9. Mild calcification of the aortic valve. 10. Trace aortic valve regurgitation. 11. Analysis of mitral valve inflow, pulmonary vein Doppler and tissue Doppler suggests grade I diastolic dysfunction without elevated left atrial pressure.    Colon polyp    Diverticular stricture ([place]) 2009   Diverticulosis 10/30/2007   DM (diabetes mellitus), type 2 ([place])    Edema 09/12/2013   Fall 05/18/2017   tripped over wheel of walker; superior and inferior pubic ramus fractures    Hypercholesterolemia    Hypertension    Hypertension, essential    Hypothyroidism    as a teenager   Indigestion    Insomnia    OA (osteoarthritis) of hip    right hip replacement, OA in bilateral knees   Osteopenia    Actonel stopped in 2012 after 5years therapy   Pneumonia 1939   "hospitalized"   Rapid resting heart rate 25/95/6387   Uncertain cause. Possible ectopic atrial tachycardia. Date entered 12/25/12    Right rotator cuff tear    Seasonal allergies     Past Surgical History:  Procedure Laterality Date   CATARACT EXTRACTION W/ INTRAOCULAR LENS  IMPLANT,  BILATERAL Bilateral    COLONOSCOPY  2009   Dr. [facility] [employer]   DILATION AND CURETTAGE OF UTERUS     FRACTURE SURGERY     JOINT REPLACEMENT     ORIF WRIST FRACTURE Right    REDUCTION MAMMAPLASTY  1999   TOTAL HIP ARTHROPLASTY Right 03-04-08   Dr. Wynelle Link    Allergies  Allergen Reactions   Amlodipine Besylate Other (See Comments)    Edema   Lisinopril Cough    Cough    Sulfamethoxazole-Trimethoprim Other (See Comments)    Unknown reaction     Outpatient Encounter Medications as of 09/23/2021   Medication Sig   acetaminophen (TYLENOL) 500 MG tablet Take 500 mg by mouth every 6 (six) hours as needed for mild pain, headache or fever.   atorvastatin (LIPITOR) 40 MG tablet TAKE 1 TABLET ONCE DAILY.   diclofenac Sodium (VOLTAREN) 1 % GEL Apply 2 g topically 4 (four) times daily.   donepezil (ARICEPT) 10 MG tablet TAKE ONE TABLET BY MOUTH AT BEDTIME   empagliflozin ([name]) 10 MG TABS tablet Take 1 tablet (10 mg total) by mouth daily before breakfast.   escitalopram (LEXAPRO) 5 MG tablet Take 5 mg by mouth daily.   famotidine (PEPCID) 20 MG tablet Take 20 mg by mouth daily.   fexofenadine (ALLEGRA) 180 MG tablet Take 1 tablet (180 mg total) by mouth daily.   Fluticasone-Umeclidin-Vilant (TRELEGY ELLIPTA) 200-62.5-25 MCG/ACT AEPB Inhale 1 puff into the lungs daily.   furosemide (LASIX) 40 MG tablet Take 1 tablet (40 mg total) by mouth daily as needed for fluid (for weight gain, 3lbs in 1 day or 5lbs in 1 week).   levalbuterol (XOPENEX HFA) 45 MCG/ACT inhaler Inhale 1 puff into the lungs every 4 (four) hours as needed for wheezing.   losartan (COZAAR) 100 MG tablet Take 100 mg by mouth daily.   memantine (NAMENDA) 10 MG tablet TAKE 1 TABLET BY MOUTH TWICE DAILY. (Patient taking differently: Take 10 mg by mouth 2 (two) times daily.)   metoprolol succinate (TOPROL-XL) 100 MG 24 hr tablet TAKE 1&1/2 TABLETS TWICE DAILY. (Patient taking differently: Take 150 mg by mouth 2 (two) times daily.)   polyethylene glycol (MIRALAX / GLYCOLAX) 17 g packet Take 17 g by mouth daily.   sodium chloride (OCEAN) 0.65 % SOLN nasal spray Place 1 spray into both nostrils as needed for congestion.   triamcinolone (NASACORT) 55 MCG/ACT AERO nasal inhaler Place 2 sprays into the nose daily.   vitamin B-12 (CYANOCOBALAMIN) 1000 MCG tablet Take 1 tablet (1,000 mcg total) by mouth daily.   [DISCONTINUED] alendronate (FOSAMAX) 70 MG tablet Take 70 mg by mouth once a week. Take with a full glass of water on an empty  stomach.   [DISCONTINUED] losartan (COZAAR) 100 MG tablet TAKE 1 TABLET DAILY WITH HCTZ TIL COMBINATION PILL AVAILABLE. (Patient taking differently: Take by mouth daily.)   [EXPIRED] guaiFENesin (MUCINEX) 600 MG 12 hr tablet Take 1 tablet (600 mg total) by mouth 2 (two) times daily for 7 days.   [DISCONTINUED] Fluticasone-Umeclidin-Vilant (TRELEGY ELLIPTA) 200-62.5-25 MCG/ACT AEPB Inhale 1 puff into the lungs daily.   No facility-administered encounter medications on file as of 09/23/2021.    Review of Systems:  Review of Systems  Constitutional:  Negative for activity change and appetite change.  HENT: Negative.    Respiratory:  Positive for cough. Negative for shortness of breath.   Cardiovascular:  Positive for leg swelling.  Gastrointestinal:  Negative for constipation.  Genitourinary: Negative.  Musculoskeletal:  Positive for gait problem. Negative for arthralgias and myalgias.  Skin: Negative.   Neurological:  Negative for dizziness and weakness.  Psychiatric/Behavioral:  Positive for confusion and dysphoric mood. Negative for sleep disturbance.     Health Maintenance  Topic Date Due   OPHTHALMOLOGY EXAM  07/11/2020   COVID-19 Vaccine (4 - Moderna series) 02/24/2021   INFLUENZA VACCINE  08/11/2021   HEMOGLOBIN A1C  03/16/2022   FOOT EXAM  04/02/2022   TETANUS/TDAP  07/21/2027   Pneumonia Vaccine 54+ Years old  Completed   DEXA SCAN  Completed   Zoster Vaccines- Shingrix  Completed   HPV VACCINES  Aged Out    Physical Exam: Vitals:   09/23/21 0907  BP: 132/86  Pulse: 92  Temp: (!) 97.3 F (36.3 C)  SpO2: (!) 88%  Weight: 179 lb 12.8 oz (81.6 kg)  Height: '5\' 8"'$  (1.727 m)   Body mass index is 27.34 kg/m. Physical Exam Vitals reviewed.  Constitutional:      Appearance: Normal appearance.  HENT:     Head: Normocephalic.     Nose: Nose normal.     Mouth/Throat:     Mouth: Mucous membranes are moist.     Pharynx: Oropharynx is clear.  Eyes:     Pupils:  Pupils are equal, round, and reactive to light.  Cardiovascular:     Rate and Rhythm: Normal rate and regular rhythm.     Pulses: Normal pulses.     Heart sounds: Normal heart sounds. No murmur heard. Pulmonary:     Effort: Pulmonary effort is normal.     Breath sounds: Normal breath sounds.  Abdominal:     General: Abdomen is flat. Bowel sounds are normal.     Palpations: Abdomen is soft.  Musculoskeletal:        General: No swelling.     Cervical back: Neck supple.     Comments: Mild swelling Bilateral  Skin:    General: Skin is warm.  Neurological:     General: No focal deficit present.     Mental Status: She is alert.  Psychiatric:        Mood and Affect: Mood normal.        Thought Content: Thought content normal.     Labs reviewed: Basic Metabolic Panel: Recent Labs    12/11/20 0331 12/24/20 1546 09/09/21 1443 09/10/21 0322 09/11/21 0435 09/15/21 0000  NA 138   < > 140 140 140 138  K 3.9   < > 4.4 3.4* 4.6 4.5  CL 109   < > 103 102 102 102  CO2 21*   < > '24 26 26 '$ 23*  GLUCOSE 111*   < > 105* 131* 115*  --   BUN 36*   < > 24* 24* 28* 41*  CREATININE 1.17*   < > 1.31* 1.33* 1.75* 1.5*  CALCIUM 8.3*   < > 9.3 9.0 9.1 9.1  MG 1.8  --   --   --   --   --   TSH  --   --   --  0.468  --   --    < > = values in this interval not displayed.   Liver Function Tests: Recent Labs    11/25/20 0000 11/26/20 0000 12/09/20 2040  AST  --  13 13*  ALT  --  10 8  ALKPHOS  --  75 88  BILITOT  --   --  1.1  PROT  --   --  6.9  ALBUMIN 3.5  --  4.0   No results for input(s): "LIPASE", "AMYLASE" in the last 8760 hours. No results for input(s): "AMMONIA" in the last 8760 hours. CBC: Recent Labs    12/09/20 1858 12/10/20 0348 12/11/20 0331 12/24/20 1546 03/26/21 0000 09/09/21 1443 09/15/21 0000  WBC 10.4 10.1 8.9 8.4 7.9 9.0 7.9  NEUTROABS 7.2 7.0  --  5,998  --   --   --   HGB 12.6 12.2 11.4* 12.7 13.9 13.7 13.3  HCT 39.6 38.3 35.7* 39.0 44 42.2 41  MCV 95.2  98.0 97.5 94.9  --  93.8  --   PLT 264 209 195 223 178 253 246   Lipid Panel: Recent Labs    09/15/21 0000  CHOL 189  HDL 49  LDLCALC 103  TRIG 188*   Lab Results  Component Value Date   HGBA1C 7.3 09/15/2021    Procedures since last visit: ECHOCARDIOGRAM COMPLETE  Result Date: 09/10/2021    ECHOCARDIOGRAM REPORT   Patient Name:   Kelsey Salinas Date of Exam: 09/10/2021 Medical Rec #:  [id number]       Height:       68.0 in Accession #:    [id number]      Weight:       180.3 lb Date of Birth:  [birth year]       BSA:          1.956 m Patient Age:    23 years        BP:           119/64 mmHg Patient Gender: F               HR:           111 bpm. Exam Location:  Outpatient Procedure: 2D Echo Indications:    CHF Acute Diastole  History:        Patient has prior history of Echocardiogram examinations, most                 recent 11/03/2011. Acute Heart Failure with preserved EF,                 Arrythmias:Tachycardia; Risk Factors:Hypertension and Diabetes.  Sonographer:    Harvie Junior Referring Phys: [id number] JARED M GARDNER  Sonographer Comments: Patient agitated; unable to turn on left side. IMPRESSIONS  1. Left ventricular ejection fraction, by estimation, is 55 to 60%. The left ventricle has normal function. The left ventricle has no regional wall motion abnormalities. There is mild left ventricular hypertrophy. Left ventricular diastolic parameters are consistent with Grade I diastolic dysfunction (impaired relaxation).  2. Right ventricular systolic function is normal. The right ventricular size is normal. There is mildly elevated pulmonary artery systolic pressure.  3. The mitral valve is normal in structure. No evidence of mitral valve regurgitation. No evidence of mitral stenosis.  4. The aortic valve is tricuspid. Aortic valve regurgitation is trivial. No aortic stenosis is present.  5. The inferior vena cava is normal in size with greater than 50% respiratory variability, suggesting right  atrial pressure of 3 mmHg. FINDINGS  Left Ventricle: Left ventricular ejection fraction, by estimation, is 55 to 60%. The left ventricle has normal function. The left ventricle has no regional wall motion abnormalities. The left ventricular internal cavity size was normal in size. There is  mild left ventricular hypertrophy. Left ventricular diastolic parameters are consistent with Grade I diastolic dysfunction (impaired relaxation). Indeterminate filling pressures. Right Ventricle: The right  ventricular size is normal. No increase in right ventricular wall thickness. Right ventricular systolic function is normal. There is mildly elevated pulmonary artery systolic pressure. The tricuspid regurgitant velocity is 3.05  m/s, and with an assumed right atrial pressure of 3 mmHg, the estimated right ventricular systolic pressure is 35.3 mmHg. Left Atrium: Left atrial size was normal in size. Right Atrium: Right atrial size was normal in size. Pericardium: There is no evidence of pericardial effusion. Mitral Valve: The mitral valve is normal in structure. No evidence of mitral valve regurgitation. No evidence of mitral valve stenosis. Tricuspid Valve: The tricuspid valve is normal in structure. Tricuspid valve regurgitation is trivial. No evidence of tricuspid stenosis. Aortic Valve: The aortic valve is tricuspid. Aortic valve regurgitation is trivial. No aortic stenosis is present. Aortic valve mean gradient measures 3.0 mmHg. Aortic valve peak gradient measures 4.0 mmHg. Aortic valve area, by VTI measures 3.07 cm. Pulmonic Valve: The pulmonic valve was normal in structure. Pulmonic valve regurgitation is mild. No evidence of pulmonic stenosis. Aorta: The aortic root is normal in size and structure. Venous: The inferior vena cava is normal in size with greater than 50% respiratory variability, suggesting right atrial pressure of 3 mmHg. IAS/Shunts: No atrial level shunt detected by color flow Doppler.  LEFT VENTRICLE  PLAX 2D LVIDd:         3.20 cm     Diastology LVIDs:         2.40 cm     LV e' medial:    5.11 cm/s LV PW:         1.00 cm     LV E/e' medial:  12.1 LV IVS:        1.10 cm     LV e' lateral:   6.09 cm/s LVOT diam:     2.00 cm     LV E/e' lateral: 10.1 LV SV:         52 LV SV Index:   27 LVOT Area:     3.14 cm  LV Volumes (MOD) LV vol d, MOD A2C: 48.6 ml LV vol d, MOD A4C: 75.4 ml LV vol s, MOD A2C: 23.4 ml LV vol s, MOD A4C: 32.4 ml LV SV MOD A2C:     25.2 ml LV SV MOD A4C:     75.4 ml LV SV MOD BP:      34.2 ml RIGHT VENTRICLE RV Basal diam:  2.80 cm RV Mid diam:    2.70 cm RV S prime:     18.40 cm/s TAPSE (M-mode): 1.8 cm LEFT ATRIUM             Index        RIGHT ATRIUM           Index LA diam:        3.40 cm 1.74 cm/m   RA Area:     13.30 cm LA Vol (A2C):   55.0 ml 28.12 ml/m  RA Volume:   31.50 ml  16.11 ml/m LA Vol (A4C):   46.4 ml 23.72 ml/m LA Biplane Vol: 52.2 ml 26.69 ml/m  AORTIC VALVE                    PULMONIC VALVE AV Area (Vmax):    2.58 cm     PV Vmax:          0.79 m/s AV Area (Vmean):   2.32 cm     PV Peak grad:     2.5  mmHg AV Area (VTI):     3.07 cm     PR End Diast Vel: 4.58 msec AV Vmax:           100.00 cm/s AV Vmean:          76.900 cm/s AV VTI:            0.170 m AV Peak Grad:      4.0 mmHg AV Mean Grad:      3.0 mmHg LVOT Vmax:         82.20 cm/s LVOT Vmean:        56.900 cm/s LVOT VTI:          0.166 m LVOT/AV VTI ratio: 0.98  AORTA Ao Root diam: 3.60 cm Ao Asc diam:  3.40 cm MITRAL VALVE               TRICUSPID VALVE MV Area (PHT): 2.07 cm    TR Peak grad:   37.2 mmHg MV Decel Time: 366 msec    TR Vmax:        305.00 cm/s MV E velocity: 61.70 cm/s MV A velocity: 92.50 cm/s  SHUNTS MV E/A ratio:  0.67        Systemic VTI:  0.17 m                            Systemic Diam: 2.00 cm Skeet Latch MD Electronically signed by Skeet Latch MD Signature Date/Time: 09/10/2021/5:20:45 PM    Final    CT Angio Chest PE W/Cm &/Or Wo Cm  Result Date: 09/09/2021 CLINICAL DATA:   Hypoxia EXAM: CT ANGIOGRAPHY CHEST WITH CONTRAST TECHNIQUE: Multidetector CT imaging of the chest was performed using the standard protocol during bolus administration of intravenous contrast. Multiplanar CT image reconstructions and MIPs were obtained to evaluate the vascular anatomy. RADIATION DOSE REDUCTION: This exam was performed according to the departmental dose-optimization program which includes automated exposure control, adjustment of the mA and/or kV according to patient size and/or use of iterative reconstruction technique. CONTRAST:  64m OMNIPAQUE IOHEXOL 350 MG/ML SOLN COMPARISON:  Chest x-ray 09/09/2021 8 FINDINGS: Cardiovascular: Satisfactory opacification of the pulmonary arteries to the segmental level. No evidence of pulmonary embolism. Moderate aortic atherosclerosis. No aneurysm. Coronary vascular calcification. Normal cardiac size. No pericardial effusion Mediastinum/Nodes: Midline trachea. No thyroid mass. No suspicious lymph nodes. Esophagus within normal limits. Lungs/Pleura: Emphysema. No pleural effusion. Mild hazy pulmonary density within the right greater than left lung fields. No significant effusion or pneumothorax. Upper Abdomen: No acute abnormality. Musculoskeletal: No chest wall abnormality. No acute or significant osseous findings. Review of the MIP images confirms the above findings. IMPRESSION: 1. Negative for acute pulmonary embolus. 2. Emphysema 3. Slight hazy right greater than left pulmonary density, could be due to diffuse atelectasis or minimal asymmetric edema. Aortic Atherosclerosis (ICD10-I70.0) and Emphysema (ICD10-J43.9). Electronically Signed   By: KDonavan FoilM.D.   On: 09/09/2021 19:17   DG Chest Portable 1 View  Result Date: 09/09/2021 CLINICAL DATA:  Shortness of breath EXAM: PORTABLE CHEST 1 VIEW COMPARISON:  12/09/2020 FINDINGS: Poor inspiration. Cardiomegaly. Aortic atherosclerosis and tortuosity. Increased interstitial markings that could represent  early interstitial edema. No consolidation or lobar collapse. No effusion. IMPRESSION: Cardiomegaly and aortic atherosclerosis. Possible early interstitial edema. Electronically Signed   By: MNelson ChimesM.D.   On: 09/09/2021 15:12    Assessment/Plan 1. Acute on chronic heart failure with preserved ejection fraction (HFpEF) ([place] On  Lasix Prn but not sure if there is anyway for Caregivers to decide to give her as they cant weight her Suggested to weight her with [facility] Also Follow POX closely Now also on [name] Not having any issues with that  2. Pulmonary emphysema, unspecified emphysema type [facility]) Patient saw Pulmonary who is planning to  do CT scan of chest  Patient was very upset and wanted me to cancel  or delay the CT  Family does want her to go through the CT scan for further work-up.   They are planning to take her to the Sunday On trelegy  3. Stage 3a chronic kidney disease ([place]) Some worsening in her Creat  Will continue to follow  4. Type 2 diabetes mellitus with stage 3b chronic kidney disease, without long-term current use of insulin (HCC) On Metformin A1C good Awaiting Eye exam  5. Age-related osteoporosis without current pathological fracture Fosamax discontinued as she is now Wheelchair dependent  6. Hypercholesterolemia On statin  7. Essential hypertension Continue on Losartan and Toprol  8. Mild episode of recurrent major depressive disorder ([place]) Had diarrhea with Lexapro Does not want to try anything else  9. Paroxysmal atrial tachycardia (HCC) On High dose of Toprol  10. Memory loss On Aricept and Namenda Has 24/7 Caregivers  Patient did not want to discuss anything more as she was upset about the CT scan    Labs/tests ordered:  * No order type specified * Next appt:  12/28/2021

## 2021-09-25 ENCOUNTER — Other Ambulatory Visit: Payer: Self-pay | Admitting: Interventional Cardiology

## 2021-09-27 ENCOUNTER — Ambulatory Visit (HOSPITAL_BASED_OUTPATIENT_CLINIC_OR_DEPARTMENT_OTHER): Payer: Medicare Other

## 2021-10-05 ENCOUNTER — Other Ambulatory Visit: Payer: Self-pay | Admitting: *Deleted

## 2021-10-05 NOTE — Telephone Encounter (Signed)
Pharmacy requested refill Pended Rx and sent to Dr. Gupta for approval.  

## 2021-10-06 MED ORDER — EMPAGLIFLOZIN 10 MG PO TABS: 10.0000 mg | ORAL_TABLET | Freq: Every day | ORAL | 1 refills | Status: DC

## 2021-10-08 ENCOUNTER — Ambulatory Visit (HOSPITAL_BASED_OUTPATIENT_CLINIC_OR_DEPARTMENT_OTHER)
Admission: RE | Admit: 2021-10-08 | Discharge: 2021-10-08 | Disposition: A | Discharge status: Home / Self Care | Payer: Medicare Other | Source: Ambulatory Visit | Attending: Pulmonary Disease | Admitting: Pulmonary Disease

## 2021-10-08 DIAGNOSIS — J438 Other emphysema: Secondary | ICD-10-CM | POA: Diagnosis not present

## 2021-10-13 ENCOUNTER — Other Ambulatory Visit (HOSPITAL_BASED_OUTPATIENT_CLINIC_OR_DEPARTMENT_OTHER): Payer: Self-pay

## 2021-10-13 DIAGNOSIS — Z23 Encounter for immunization: Secondary | ICD-10-CM | POA: Diagnosis not present

## 2021-10-13 MED ORDER — FLUAD QUADRIVALENT 0.5 ML IM PRSY
PREFILLED_SYRINGE | INTRAMUSCULAR | 0 refills | Status: DC
  Filled 2021-10-13: qty 0.5, 1d supply, fill #0

## 2021-10-21 ENCOUNTER — Ambulatory Visit (HOSPITAL_BASED_OUTPATIENT_CLINIC_OR_DEPARTMENT_OTHER): Payer: Medicare Other

## 2021-11-05 ENCOUNTER — Other Ambulatory Visit: Payer: Self-pay | Admitting: Internal Medicine

## 2021-11-05 DIAGNOSIS — R413 Other amnesia: Secondary | ICD-10-CM

## 2021-11-06 ENCOUNTER — Ambulatory Visit (INDEPENDENT_AMBULATORY_CARE_PROVIDER_SITE_OTHER): Payer: Medicare Other | Admitting: Cardiology

## 2021-11-06 ENCOUNTER — Encounter (HOSPITAL_BASED_OUTPATIENT_CLINIC_OR_DEPARTMENT_OTHER): Payer: Self-pay | Admitting: Cardiology

## 2021-11-06 VITALS — BP 136/84 | HR 96 | Ht 68.0 in | Wt 181.0 lb

## 2021-11-06 DIAGNOSIS — I5032 Chronic diastolic (congestive) heart failure: Secondary | ICD-10-CM

## 2021-11-06 DIAGNOSIS — I4719 Other supraventricular tachycardia: Secondary | ICD-10-CM

## 2021-11-06 DIAGNOSIS — E7800 Pure hypercholesterolemia, unspecified: Secondary | ICD-10-CM | POA: Diagnosis not present

## 2021-11-06 DIAGNOSIS — N1831 Chronic kidney disease, stage 3a: Secondary | ICD-10-CM | POA: Diagnosis not present

## 2021-11-06 DIAGNOSIS — I10 Essential (primary) hypertension: Secondary | ICD-10-CM

## 2021-11-06 NOTE — Progress Notes (Signed)
Cardiology Office Note:    Date:  11/06/2021   ID:  Kelsey Salinas, DOB 10-Oct-1926, MRN [id number]  PCP:  Virgie Dad, MD  Cardiologist:  Buford Dresser, MD  Referring MD: Virgie Dad, MD   CC: establish care with me/prior patient of Dr. Tamala Julian  History of Present Illness:    Kelsey Salinas is a 88 y.o. female with a hx of chronic diastolic heart failure, chronic sinus tachycardia, and hypertension who is seen as a follow up for CHF. Patient last seen by Dr. Tamala Julian on 04/14/2020. I met her in the hospital 09/10/21.  She was hospitalized for 2 days beginning on 09/09/21 for respiratory symptoms.  Today, she states that she has been doing well. She has been experiencing cough but has had relief with cough drops. She denies any fevers or chills.   She notes that her heart rates have improved since her ED visits. She feels that PT exercises have helped with this.  She denies any issues with current medications.   Her appetite has been normal.  She denies any palpitations, chest pain, shortness of breath, or peripheral edema. No lightheadedness, headaches, syncope, orthopnea, or PND.  Past Medical History:  Diagnosis Date   Abscess of right leg 12/24/2020   Childhood asthma    Chronic diastolic heart failure ([place]) 12/25/2012   October 2013 echocardiogram 1. There is moderate concentric left ventricle hypertrophy. 2. Left ventricular ejection fraction estimated by 2D at 60-65 percent. 3. There were no regional wall motion abnormalities. 4. Mild mitral annular calcification. 5. Mild mitral valve regurgitation. 6. There is mild tricuspid regurgitation. 7. Mildly elevated estimated right ventricular systolic pressure. 8. Right ventricular systolic pressure estimated at 35-40 mm Hg. 9. Mild calcification of the aortic valve. 10. Trace aortic valve regurgitation. 11. Analysis of mitral valve inflow, pulmonary vein Doppler and tissue Doppler suggests grade I diastolic dysfunction  without elevated left atrial pressure.    Colon polyp    Diverticular stricture ([place]) 2009   Diverticulosis 10/30/2007   DM (diabetes mellitus), type 2 ([place])    Edema 09/12/2013   Fall 05/18/2017   tripped over wheel of walker; superior and inferior pubic ramus fractures    Hypercholesterolemia    Hypertension    Hypertension, essential    Hypothyroidism    as a teenager   Indigestion    Insomnia    OA (osteoarthritis) of hip    right hip replacement, OA in bilateral knees   Osteopenia    Actonel stopped in 2012 after 5years therapy   Pneumonia 1939   "hospitalized"   Rapid resting heart rate 25/36/6440   Uncertain cause. Possible ectopic atrial tachycardia. Date entered 12/25/12    Right rotator cuff tear    Seasonal allergies     Past Surgical History:  Procedure Laterality Date   CATARACT EXTRACTION W/ INTRAOCULAR LENS  IMPLANT, BILATERAL Bilateral    COLONOSCOPY  2009   Dr. [employer] [employer]   DILATION AND CURETTAGE OF UTERUS     FRACTURE SURGERY     JOINT REPLACEMENT     ORIF WRIST FRACTURE Right    [place] Right 03-04-08   Dr. Wynelle Link    Current Medications: Current Outpatient Medications on File Prior to Visit  Medication Sig   acetaminophen (TYLENOL) 500 MG tablet Take 500 mg by mouth every 6 (six) hours as needed for mild pain, headache or fever.   atorvastatin (LIPITOR) 40 MG tablet TAKE 1 TABLET ONCE  DAILY.   diclofenac Sodium (VOLTAREN) 1 % GEL Apply 2 g topically 4 (four) times daily.   donepezil (ARICEPT) 10 MG tablet TAKE ONE TABLET BY MOUTH AT BEDTIME   empagliflozin ([name]) 10 MG TABS tablet Take 1 tablet (10 mg total) by mouth daily before breakfast.   escitalopram (LEXAPRO) 5 MG tablet Take 5 mg by mouth daily.   famotidine (PEPCID) 20 MG tablet Take 20 mg by mouth daily.   fexofenadine (ALLEGRA) 180 MG tablet Take 1 tablet (180 mg total) by mouth daily.   Fluticasone-Umeclidin-Vilant (TRELEGY ELLIPTA)  200-62.5-25 MCG/ACT AEPB Inhale 1 puff into the lungs daily.   furosemide (LASIX) 40 MG tablet Take 1 tablet (40 mg total) by mouth daily as needed for fluid (for weight gain, 3lbs in 1 day or 5lbs in 1 week).   influenza vaccine adjuvanted (FLUAD QUADRIVALENT) 0.5 ML injection Inject into the muscle.   levalbuterol (XOPENEX HFA) 45 MCG/ACT inhaler Inhale 1 puff into the lungs every 4 (four) hours as needed for wheezing.   losartan (COZAAR) 100 MG tablet Take 100 mg by mouth daily.   memantine (NAMENDA) 10 MG tablet TAKE 1 TABLET BY MOUTH TWICE DAILY. (Patient taking differently: Take 10 mg by mouth 2 (two) times daily.)   metoprolol succinate (TOPROL-XL) 100 MG 24 hr tablet TAKE 1&1/2 TABS TWICE DAILY.--CALL AND SCHEDULE FOLLOW UP APPOINTMENT FOR MORE REFILLS. [phone] - 3rd attempt   polyethylene glycol (MIRALAX / GLYCOLAX) 17 g packet Take 17 g by mouth daily.   sodium chloride (OCEAN) 0.65 % SOLN nasal spray Place 1 spray into both nostrils as needed for congestion.   triamcinolone (NASACORT) 55 MCG/ACT AERO nasal inhaler Place 2 sprays into the nose daily.   vitamin B-12 (CYANOCOBALAMIN) 1000 MCG tablet Take 1 tablet (1,000 mcg total) by mouth daily.   No current facility-administered medications on file prior to visit.     Allergies:   Amlodipine besylate, Lisinopril, and Sulfamethoxazole-trimethoprim   Social History   Tobacco Use   Smoking status: Former    Packs/day: 0.12    Years: 50.00    Total pack years: 6.00    Types: Cigarettes   Smokeless tobacco: Never   Tobacco comments:    "stopped in the ate 1990s"  Vaping Use   Vaping Use: Never used  Substance Use Topics   Alcohol use: Yes    Comment: rarely   Drug use: Never    Family History: family history includes Heart disease in her brother, father, and mother.  ROS:   Please see the history of present illness.  (+) Cough  Additional pertinent ROS:  EKGs/Labs/Other Studies Reviewed:    The following  studies were reviewed today:  Echo 09/10/21: 1. Left ventricular ejection fraction, by estimation, is 55 to 60%. The  left ventricle has normal function. The left ventricle has no regional  wall motion abnormalities. There is mild left ventricular hypertrophy.  Left ventricular diastolic parameters  are consistent with Grade I diastolic dysfunction (impaired relaxation).   2. Right ventricular systolic function is normal. The right ventricular  size is normal. There is mildly elevated pulmonary artery systolic  pressure.   3. The mitral valve is normal in structure. No evidence of mitral valve  regurgitation. No evidence of mitral stenosis.   4. The aortic valve is tricuspid. Aortic valve regurgitation is trivial.  No aortic stenosis is present.   5. The inferior vena cava is normal in size with greater than 50%  respiratory variability, suggesting right  atrial pressure of 3 mmHg.   Holter Monitor 04/04/2017: Normal sinus rhythm Occasional PVC's with occasional triplets. Rare atrial bigeminy during sleep. No sustained arrhythmias  EKG:  EKG is personally reviewed.   09/09/21: NSR at 68 bpm  Recent Labs: 12/09/2020: ALT 8 12/11/2020: Magnesium 1.8 09/09/2021: B Natriuretic Peptide 349.3 09/10/2021: TSH 0.468 09/15/2021: BUN 41; Creatinine 1.5; Hemoglobin 13.3; Platelets 246; Potassium 4.5; Sodium 138  Recent Lipid Panel    Component Value Date/Time   CHOL 189 09/15/2021 0000   TRIG 188 (A) 09/15/2021 0000   HDL 49 09/15/2021 0000   LDLCALC 103 09/15/2021 0000    Physical Exam:    VS:  BP 136/84   Pulse 96   Ht '5\' 8"'$  (1.727 m)   Wt 181 lb (82.1 kg)   BMI 27.52 kg/m     Wt Readings from Last 3 Encounters:  11/06/21 181 lb (82.1 kg)  09/23/21 179 lb 12.8 oz (81.6 kg)  09/11/21 178 lb 9.2 oz (81 kg)    GEN: Well nourished, well developed in no acute distress HEENT: Normal, moist mucous membranes NECK: No JVD CARDIAC: regular rhythm, normal S1 and S2, no rubs or gallops.  No murmur. VASCULAR: Radial and DP pulses 2+ bilaterally. No carotid bruits RESPIRATORY:  Clear to auscultation without rales, wheezing or rhonchi  ABDOMEN: Soft, non-tender, non-distended MUSCULOSKELETAL:  Ambulates independently SKIN: Warm and dry, no edema NEUROLOGIC:  Alert and oriented x 3. No focal neuro deficits noted. PSYCHIATRIC:  Normal affect    ASSESSMENT:    1. Chronic diastolic heart failure ([place])   2. Essential hypertension   3. Stage 3a chronic kidney disease ([place])   4. Hypercholesterolemia   5. Paroxysmal atrial tachycardia    PLAN:    History of atrial tachycardia -noted while hospitalized 08/2021 with respiratory infection -no recent symptoms -tolerating current metoprolol dose  Chronic diastolic heart failure Stage 3a chronic kidney disease -admission 08/2021 thought to have component of acute heart failure, but this was largely respiratory and she was euvolemic on exam -continue empagliflozin -has PRN lasix  Hypertension -continue losartan  Hypercholesterolemia -tolerating atorvastatin, continue  Cardiac risk counseling and prevention recommendations: -recommend heart healthy/Mediterranean diet, with whole grains, fruits, vegetable, fish, lean meats, nuts, and olive oil. Limit salt. -recommend moderate walking, 3-5 times/week for 30-50 minutes each session. Aim for at least 150 minutes.week. Goal should be pace of 3 miles/hours, or walking 1.5 miles in 30 minutes -recommend avoidance of tobacco products. Avoid excess alcohol. -ASCVD risk score: The ASCVD Risk score (Arnett DK, et al., 2019) failed to calculate for the following reasons:   The 2019 ASCVD risk score is only valid for ages 20 to 25    Plan for follow up: I would be happy to see her back as needed  Buford Dresser, MD, PhD, [facility]    Medication Adjustments/Labs and Tests Ordered: Current medicines are reviewed at length with the patient today.  Concerns  regarding medicines are outlined above.  No orders of the defined types were placed in this encounter.  No orders of the defined types were placed in this encounter.  Patient Instructions  Medication Instructions:  Your physician recommends that you continue on your current medications as directed. Please refer to the Current Medication list given to you today.  *If you need a refill on your cardiac medications before your next appointment, please call your pharmacy*  Follow-Up: At [facility], you and your health needs are our  priority.  As part of our continuing mission to provide you with exceptional heart care, we have created designated Provider Care Teams.  These Care Teams include your primary Cardiologist (physician) and Advanced Practice Providers (APPs -  Physician Assistants and Nurse Practitioners) who all work together to provide you with the care you need, when you need it.  We recommend signing up for the patient portal called "MyChart".  Sign up information is provided on this After Visit Summary.  MyChart is used to connect with patients for Virtual Visits (Telemedicine).  Patients are able to view lab/test results, encounter notes, upcoming appointments, etc.  Non-urgent messages can be sent to your provider as well.   To learn more about what you can do with MyChart, go to [email or web address].    Your next appointment:   As needed   Important Information About Sugar          I,Alexis Herring,acting as a scribe for Buford Dresser, MD.,have documented all relevant documentation on the behalf of Buford Dresser, MD,as directed by  Buford Dresser, MD while in the presence of Buford Dresser, MD.  I, Buford Dresser, MD, have reviewed all documentation for this visit. The documentation on 02/08/22 for the exam, diagnosis, procedures, and orders are all accurate and complete.   Signed, Buford Dresser, MD  PhD 11/06/2021     [place]

## 2021-11-06 NOTE — Patient Instructions (Signed)
Medication Instructions:  Your physician recommends that you continue on your current medications as directed. Please refer to the Current Medication list given to you today.  *If you need a refill on your cardiac medications before your next appointment, please call your pharmacy*  Follow-Up: At [facility], you and your health needs are our priority.  As part of our continuing mission to provide you with exceptional heart care, we have created designated Provider Care Teams.  These Care Teams include your primary Cardiologist (physician) and Advanced Practice Providers (APPs -  Physician Assistants and Nurse Practitioners) who all work together to provide you with the care you need, when you need it.  We recommend signing up for the patient portal called "MyChart".  Sign up information is provided on this After Visit Summary.  MyChart is used to connect with patients for Virtual Visits (Telemedicine).  Patients are able to view lab/test results, encounter notes, upcoming appointments, etc.  Non-urgent messages can be sent to your provider as well.   To learn more about what you can do with MyChart, go to [email or web address].    Your next appointment:   As needed   Important Information About Sugar

## 2021-11-09 ENCOUNTER — Other Ambulatory Visit: Payer: Self-pay | Admitting: Internal Medicine

## 2021-11-10 ENCOUNTER — Other Ambulatory Visit: Payer: Self-pay | Admitting: Interventional Cardiology

## 2021-11-10 NOTE — Telephone Encounter (Signed)
Rx request sent to pharmacy.  

## 2021-11-11 DIAGNOSIS — Z23 Encounter for immunization: Secondary | ICD-10-CM | POA: Diagnosis not present

## 2021-11-13 ENCOUNTER — Encounter: Payer: Self-pay | Admitting: Pulmonary Disease

## 2021-11-13 NOTE — Telephone Encounter (Signed)
Dr. Vaughan Browner, please see pt message regarding pt's medications. Thanks.

## 2021-11-19 ENCOUNTER — Ambulatory Visit: Payer: Medicare Other | Admitting: Pulmonary Disease

## 2021-12-07 ENCOUNTER — Other Ambulatory Visit: Payer: Self-pay | Admitting: Cardiology

## 2021-12-07 NOTE — Telephone Encounter (Signed)
Rx(s) sent to pharmacy electronically.  

## 2021-12-28 ENCOUNTER — Encounter: Payer: Medicare Other | Admitting: Adult Health

## 2021-12-29 ENCOUNTER — Non-Acute Institutional Stay: Payer: Medicare Other | Admitting: Internal Medicine

## 2021-12-29 ENCOUNTER — Encounter: Payer: Self-pay | Admitting: Internal Medicine

## 2021-12-29 VITALS — BP 126/68 | HR 90 | Temp 97.6°F | Resp 17 | Ht 68.0 in | Wt 178.0 lb

## 2021-12-29 DIAGNOSIS — E1122 Type 2 diabetes mellitus with diabetic chronic kidney disease: Secondary | ICD-10-CM | POA: Diagnosis not present

## 2021-12-29 DIAGNOSIS — F03B0 Unspecified dementia, moderate, without behavioral disturbance, psychotic disturbance, mood disturbance, and anxiety: Secondary | ICD-10-CM

## 2021-12-29 DIAGNOSIS — I10 Essential (primary) hypertension: Secondary | ICD-10-CM | POA: Diagnosis not present

## 2021-12-29 DIAGNOSIS — F330 Major depressive disorder, recurrent, mild: Secondary | ICD-10-CM | POA: Diagnosis not present

## 2021-12-29 DIAGNOSIS — I4719 Other supraventricular tachycardia: Secondary | ICD-10-CM

## 2021-12-29 DIAGNOSIS — R21 Rash and other nonspecific skin eruption: Secondary | ICD-10-CM | POA: Diagnosis not present

## 2021-12-29 DIAGNOSIS — N1832 Chronic kidney disease, stage 3b: Secondary | ICD-10-CM | POA: Diagnosis not present

## 2021-12-29 DIAGNOSIS — E7800 Pure hypercholesterolemia, unspecified: Secondary | ICD-10-CM

## 2021-12-29 DIAGNOSIS — I5032 Chronic diastolic (congestive) heart failure: Secondary | ICD-10-CM | POA: Diagnosis not present

## 2021-12-29 DIAGNOSIS — J849 Interstitial pulmonary disease, unspecified: Secondary | ICD-10-CM

## 2021-12-29 DIAGNOSIS — N1831 Chronic kidney disease, stage 3a: Secondary | ICD-10-CM | POA: Diagnosis not present

## 2021-12-29 NOTE — Progress Notes (Unsigned)
Location:  [place] of Service:  Clinic (12)  Provider:   Code Status: *** Goals of Care:     12/29/2021    9:22 AM  Advanced Directives  Does Patient Have a Medical Advance Directive? Yes  Type of Paramedic of [place];Living will  Does patient want to make changes to medical advance directive? No - Patient declined     Chief Complaint  Patient presents with   Medical Management of Chronic Issues    3 month follow up   Quality Metric Gaps    Dscussed the need for eye exam and AWV    HPI: Patient is a 88 y.o. female seen today for medical management of chronic diseases.     Past Medical History:  Diagnosis Date   Abscess of right leg 12/24/2020   Childhood asthma    Chronic diastolic heart failure ([place]) 12/25/2012   October 2013 echocardiogram 1. There is moderate concentric left ventricle hypertrophy. 2. Left ventricular ejection fraction estimated by 2D at 60-65 percent. 3. There were no regional wall motion abnormalities. 4. Mild mitral annular calcification. 5. Mild mitral valve regurgitation. 6. There is mild tricuspid regurgitation. 7. Mildly elevated estimated right ventricular systolic pressure. 8. Right ventricular systolic pressure estimated at 35-40 mm Hg. 9. Mild calcification of the aortic valve. 10. Trace aortic valve regurgitation. 11. Analysis of mitral valve inflow, pulmonary vein Doppler and tissue Doppler suggests grade I diastolic dysfunction without elevated left atrial pressure.    Colon polyp    Diverticular stricture ([place]) 2009   Diverticulosis 10/30/2007   DM (diabetes mellitus), type 2 ([place])    Edema 09/12/2013   Fall 05/18/2017   tripped over wheel of walker; superior and inferior pubic ramus fractures    Hypercholesterolemia    Hypertension    Hypertension, essential    Hypothyroidism    as a teenager   Indigestion    Insomnia    OA (osteoarthritis) of hip    right hip  replacement, OA in bilateral knees   Osteopenia    Actonel stopped in 2012 after 5years therapy   Pneumonia 1939   "hospitalized"   Rapid resting heart rate 90/24/0973   Uncertain cause. Possible ectopic atrial tachycardia. Date entered 12/25/12    Right rotator cuff tear    Seasonal allergies     Past Surgical History:  Procedure Laterality Date   CATARACT EXTRACTION W/ INTRAOCULAR LENS  IMPLANT, BILATERAL Bilateral    COLONOSCOPY  2009   Dr. [employer]   DILATION AND CURETTAGE OF UTERUS     FRACTURE SURGERY     JOINT REPLACEMENT     ORIF WRIST FRACTURE Right    REDUCTION MAMMAPLASTY  1999   TOTAL HIP ARTHROPLASTY Right 03-04-08   Dr. Wynelle Link    Allergies  Allergen Reactions   Amlodipine Besylate Other (See Comments)    Edema   Lisinopril Cough    Cough    Sulfamethoxazole-Trimethoprim Other (See Comments)    Unknown reaction     Outpatient Encounter Medications as of 12/29/2021  Medication Sig   acetaminophen (TYLENOL) 500 MG tablet Take 500 mg by mouth every 6 (six) hours as needed for mild pain, headache or fever.   atorvastatin (LIPITOR) 40 MG tablet TAKE 1 TABLET ONCE DAILY.   diclofenac Sodium (VOLTAREN) 1 % GEL Apply 2 g topically 4 (four) times daily.   donepezil (ARICEPT) 10 MG tablet TAKE ONE TABLET BY MOUTH AT BEDTIME   empagliflozin (  [name]) 10 MG TABS tablet Take 1 tablet (10 mg total) by mouth daily before breakfast.   escitalopram (LEXAPRO) 5 MG tablet Take 5 mg by mouth daily.   famotidine (PEPCID) 20 MG tablet Take 20 mg by mouth daily.   fexofenadine (ALLEGRA) 180 MG tablet Take 1 tablet (180 mg total) by mouth daily.   Fluticasone-Umeclidin-Vilant (TRELEGY ELLIPTA) 200-62.5-25 MCG/ACT AEPB Inhale 1 puff into the lungs daily.   furosemide (LASIX) 40 MG tablet Take 1 tablet (40 mg total) by mouth daily as needed for fluid (for weight gain, 3lbs in 1 day or 5lbs in 1 week).   influenza vaccine adjuvanted (FLUAD QUADRIVALENT) 0.5 ML injection Inject  into the muscle.   levalbuterol (XOPENEX HFA) 45 MCG/ACT inhaler Inhale 1 puff into the lungs every 4 (four) hours as needed for wheezing.   losartan (COZAAR) 100 MG tablet Take 100 mg by mouth daily.   memantine (NAMENDA) 10 MG tablet TAKE 1 TABLET BY MOUTH TWICE DAILY.   metoprolol succinate (TOPROL-XL) 100 MG 24 hr tablet Take 1.5 tablets (150 mg total) by mouth in the morning and at bedtime.   polyethylene glycol (MIRALAX / GLYCOLAX) 17 g packet Take 17 g by mouth daily.   sodium chloride (OCEAN) 0.65 % SOLN nasal spray Place 1 spray into both nostrils as needed for congestion.   triamcinolone (NASACORT) 55 MCG/ACT AERO nasal inhaler Place 2 sprays into the nose daily.   vitamin B-12 (CYANOCOBALAMIN) 1000 MCG tablet Take 1 tablet (1,000 mcg total) by mouth daily.   No facility-administered encounter medications on file as of 12/29/2021.    Review of Systems:  Review of Systems  Health Maintenance  Topic Date Due   Medicare Annual Wellness (AWV)  12/30/2021   OPHTHALMOLOGY EXAM  12/29/2021 (Originally 07/11/2020)   COVID-19 Vaccine (7 - 2023-24 season) 01/06/2022   HEMOGLOBIN A1C  03/16/2022   FOOT EXAM  04/02/2022   DTaP/Tdap/Td (2 - Td or Tdap) 07/20/2027   Pneumonia Vaccine 31+ Years old  Completed   INFLUENZA VACCINE  Completed   DEXA SCAN  Completed   Zoster Vaccines- Shingrix  Completed   HPV VACCINES  Aged Out    Physical Exam: Vitals:   12/29/21 0923  BP: 126/68  Pulse: 90  Resp: 17  Temp: 97.6 F (36.4 C)  TempSrc: Temporal  SpO2: 96%  Weight: 178 lb (80.7 kg)  Height: '5\' 8"'$  (1.727 m)   Body mass index is 27.06 kg/m. Physical Exam  Labs reviewed: Basic Metabolic Panel: Recent Labs    09/09/21 1443 09/10/21 0322 09/11/21 0435 09/15/21 0000  NA 140 140 140 138  K 4.4 3.4* 4.6 4.5  CL 103 102 102 102  CO2 '24 26 26 '$ 23*  GLUCOSE 105* 131* 115*  --   BUN 24* 24* 28* 41*  CREATININE 1.31* 1.33* 1.75* 1.5*  CALCIUM 9.3 9.0 9.1 9.1  TSH  --  0.468  --    --    Liver Function Tests: No results for input(s): "AST", "ALT", "ALKPHOS", "BILITOT", "PROT", "ALBUMIN" in the last 8760 hours. No results for input(s): "LIPASE", "AMYLASE" in the last 8760 hours. No results for input(s): "AMMONIA" in the last 8760 hours. CBC: Recent Labs    03/26/21 0000 09/09/21 1443 09/15/21 0000  WBC 7.9 9.0 7.9  HGB 13.9 13.7 13.3  HCT 44 42.2 41  MCV  --  93.8  --   PLT 178 253 246   Lipid Panel: Recent Labs    09/15/21 0000  CHOL 189  HDL 49  LDLCALC 103  TRIG 188*   Lab Results  Component Value Date   HGBA1C 7.3 09/15/2021    Procedures since last visit: No results found.  Assessment/Plan There are no diagnoses linked to this encounter.   Labs/tests ordered:  * No order type specified * Next appt:  01/06/2022

## 2022-01-06 ENCOUNTER — Encounter: Payer: Medicare Other | Admitting: Family

## 2022-01-13 ENCOUNTER — Encounter: Payer: Medicare Other | Admitting: Family

## 2022-02-08 ENCOUNTER — Encounter (HOSPITAL_BASED_OUTPATIENT_CLINIC_OR_DEPARTMENT_OTHER): Payer: Self-pay | Admitting: Cardiology

## 2022-02-25 ENCOUNTER — Other Ambulatory Visit: Payer: Self-pay | Admitting: *Deleted

## 2022-02-25 MED ORDER — EMPAGLIFLOZIN 10 MG PO TABS: 10.0000 mg | ORAL_TABLET | Freq: Every day | ORAL | 1 refills | Status: DC

## 2022-02-25 NOTE — Telephone Encounter (Signed)
[employer] Requested refill.

## 2022-02-26 ENCOUNTER — Other Ambulatory Visit: Payer: Self-pay | Admitting: Internal Medicine

## 2022-02-26 DIAGNOSIS — E7800 Pure hypercholesterolemia, unspecified: Secondary | ICD-10-CM

## 2022-04-05 DIAGNOSIS — E113291 Type 2 diabetes mellitus with mild nonproliferative diabetic retinopathy without macular edema, right eye: Secondary | ICD-10-CM | POA: Diagnosis not present

## 2022-04-05 DIAGNOSIS — H52203 Unspecified astigmatism, bilateral: Secondary | ICD-10-CM | POA: Diagnosis not present

## 2022-04-05 DIAGNOSIS — Z961 Presence of intraocular lens: Secondary | ICD-10-CM | POA: Diagnosis not present

## 2022-04-23 ENCOUNTER — Other Ambulatory Visit: Payer: Self-pay | Admitting: Internal Medicine

## 2022-04-23 DIAGNOSIS — R413 Other amnesia: Secondary | ICD-10-CM

## 2022-04-29 DIAGNOSIS — E7800 Pure hypercholesterolemia, unspecified: Secondary | ICD-10-CM | POA: Diagnosis not present

## 2022-04-29 DIAGNOSIS — E1122 Type 2 diabetes mellitus with diabetic chronic kidney disease: Secondary | ICD-10-CM | POA: Diagnosis not present

## 2022-04-29 DIAGNOSIS — I5032 Chronic diastolic (congestive) heart failure: Secondary | ICD-10-CM | POA: Diagnosis not present

## 2022-04-29 LAB — BASIC METABOLIC PANEL WITH GFR
BUN: 23 — AB (ref 4–21)
CO2: 23 — AB (ref 13–22)
Creatinine: 1.1 (ref 0.5–1.1)
Potassium: 4.3 meq/L (ref 3.5–5.1)
Sodium: 142 (ref 137–147)

## 2022-04-29 LAB — HEMOGLOBIN A1C: Hemoglobin A1C: 7.7

## 2022-04-29 LAB — LIPID PANEL
Cholesterol: 159 (ref 0–200)
HDL: 62 (ref 35–70)
LDL Cholesterol: 73
Triglycerides: 120 (ref 40–160)

## 2022-04-29 LAB — HEPATIC FUNCTION PANEL
ALT: 14 U/L (ref 7–35)
AST: 19 (ref 13–35)
Alkaline Phosphatase: 102 (ref 25–125)
Bilirubin, Total: 0.5

## 2022-04-29 LAB — CBC AND DIFFERENTIAL
HCT: 46 (ref 36–46)
Hemoglobin: 15 (ref 12.0–16.0)
Platelets: 180 10*3/uL (ref 150–400)
WBC: 7.2

## 2022-04-29 LAB — COMPREHENSIVE METABOLIC PANEL WITH GFR
Albumin: 3.9 (ref 3.5–5.0)
Calcium: 9.2 (ref 8.7–10.7)
Globulin: 2.4
eGFR: 49

## 2022-04-29 LAB — TSH: TSH: 1.07 (ref 0.41–5.90)

## 2022-04-29 LAB — CBC: RBC: 4.91 (ref 3.87–5.11)

## 2022-05-04 ENCOUNTER — Encounter: Payer: Self-pay | Admitting: Internal Medicine

## 2022-05-04 ENCOUNTER — Non-Acute Institutional Stay: Payer: Medicare Other | Admitting: Internal Medicine

## 2022-05-04 VITALS — BP 126/82 | HR 94 | Temp 97.6°F | Resp 16 | Ht 68.0 in | Wt 176.4 lb

## 2022-05-04 DIAGNOSIS — M810 Age-related osteoporosis without current pathological fracture: Secondary | ICD-10-CM | POA: Diagnosis not present

## 2022-05-04 DIAGNOSIS — J849 Interstitial pulmonary disease, unspecified: Secondary | ICD-10-CM

## 2022-05-04 DIAGNOSIS — N1831 Chronic kidney disease, stage 3a: Secondary | ICD-10-CM

## 2022-05-04 DIAGNOSIS — F330 Major depressive disorder, recurrent, mild: Secondary | ICD-10-CM | POA: Diagnosis not present

## 2022-05-04 DIAGNOSIS — F03B0 Unspecified dementia, moderate, without behavioral disturbance, psychotic disturbance, mood disturbance, and anxiety: Secondary | ICD-10-CM

## 2022-05-04 DIAGNOSIS — N1832 Chronic kidney disease, stage 3b: Secondary | ICD-10-CM | POA: Diagnosis not present

## 2022-05-04 DIAGNOSIS — I10 Essential (primary) hypertension: Secondary | ICD-10-CM | POA: Diagnosis not present

## 2022-05-04 DIAGNOSIS — E1122 Type 2 diabetes mellitus with diabetic chronic kidney disease: Secondary | ICD-10-CM

## 2022-05-04 DIAGNOSIS — I5032 Chronic diastolic (congestive) heart failure: Secondary | ICD-10-CM

## 2022-05-04 DIAGNOSIS — I4719 Other supraventricular tachycardia: Secondary | ICD-10-CM

## 2022-05-04 DIAGNOSIS — E7800 Pure hypercholesterolemia, unspecified: Secondary | ICD-10-CM

## 2022-05-04 NOTE — Patient Instructions (Signed)
Can take 20 mg or half tablet 2/week Flonase 2 sprays QAM for 4 weeks

## 2022-05-04 NOTE — Progress Notes (Signed)
Location:  [facility] features editor of Service:  Clinic (12)  Provider:   Code Status:  Goals of Care:     05/04/2022   10:24 AM  Advanced Directives  Does Patient Have a Medical Advance Directive? Yes  Type of Estate agent of [place];Living will  Copy of Healthcare Power of Attorney in Chart? No - copy requested     Chief Complaint  Patient presents with   Medical Management of Chronic Issues    Patient is being seen for 4 month follow up.   Health Maintenance    Discussed the need for eye and foot exam, also an Medicare annual wellness.    HPI: Patient is a 89 y.o. female seen today for medical management of chronic diseases.   Lives in [place] with 24/7 caregivers   Patient has a history of Alzheimer's dementia, diastolic CHF, HLD, arthritis, history of osteoporosis, allergic rhinitis  And recent diagnosis of ILD Refused treatment or further work up  Acute issue Some edema in Legs gets worse end of the day No SOB Does have Rhinitis and Cough sometimes more in the Morning No SOB Patient stays in her wheelchair Walks with the assist and holding furniture Has full time Caregivers No Depression Weight stable  Past Medical History:  Diagnosis Date   Abscess of right leg 12/24/2020   Childhood asthma    Chronic diastolic heart failure 12/25/2012   October 2013 echocardiogram 1. There is moderate concentric left ventricle hypertrophy. 2. Left ventricular ejection fraction estimated by 2D at 60-65 percent. 3. There were no regional wall motion abnormalities. 4. Mild mitral annular calcification. 5. Mild mitral valve regurgitation. 6. There is mild tricuspid regurgitation. 7. Mildly elevated estimated right ventricular systolic pressure. 8. Right ventricular systolic pressure estimated at 35-40 mm Hg. 9. Mild calcification of the aortic valve. 10. Trace aortic valve regurgitation. 11. Analysis of mitral valve inflow, pulmonary vein Doppler  and tissue Doppler suggests grade I diastolic dysfunction without elevated left atrial pressure.    Colon polyp    Diverticular stricture 2009   Diverticulosis 10/30/2007   DM (diabetes mellitus), type 2    Edema 09/12/2013   Fall 05/18/2017   tripped over wheel of walker; superior and inferior pubic ramus fractures    Hypercholesterolemia    Hypertension    Hypertension, essential    Hypothyroidism    as a teenager   Indigestion    Insomnia    OA (osteoarthritis) of hip    right hip replacement, OA in bilateral knees   Osteopenia    Actonel stopped in 2012 after 5years therapy   Pneumonia 1939   "hospitalized"   Rapid resting heart rate 12/25/2012   Uncertain cause. Possible ectopic atrial tachycardia. Date entered 12/25/12    Right rotator cuff tear    Seasonal allergies     Past Surgical History:  Procedure Laterality Date   CATARACT EXTRACTION W/ INTRAOCULAR LENS  IMPLANT, BILATERAL Bilateral    COLONOSCOPY  2009   Dr. Melchor Amour   DILATION AND CURETTAGE OF UTERUS     FRACTURE SURGERY     JOINT REPLACEMENT     ORIF WRIST FRACTURE Right    REDUCTION MAMMAPLASTY  1999   TOTAL HIP ARTHROPLASTY Right 03-04-08   Dr. Lequita Halt    Allergies  Allergen Reactions   Amlodipine Besylate Other (See Comments)    Edema   Lisinopril Cough    Cough    Sulfamethoxazole-Trimethoprim Other (See Comments)  Unknown reaction     Outpatient Encounter Medications as of 05/04/2022  Medication Sig   acetaminophen (TYLENOL) 500 MG tablet Take 500 mg by mouth every 6 (six) hours as needed for mild pain, headache or fever.   atorvastatin (LIPITOR) 40 MG tablet TAKE 1 TABLET ONCE DAILY.   diclofenac Sodium (VOLTAREN) 1 % GEL Apply 2 g topically 4 (four) times daily.   donepezil (ARICEPT) 10 MG tablet TAKE ONE TABLET BY MOUTH AT BEDTIME   empagliflozin ([name]) 10 MG TABS tablet Take 1 tablet (10 mg total) by mouth daily before breakfast.   famotidine (PEPCID) 20 MG tablet Take 20 mg by  mouth daily.   fexofenadine (ALLEGRA) 180 MG tablet Take 1 tablet (180 mg total) by mouth daily.   Fluticasone-Umeclidin-Vilant (TRELEGY ELLIPTA) 200-62.5-25 MCG/ACT AEPB Inhale 1 puff into the lungs daily.   furosemide (LASIX) 40 MG tablet Take 1 tablet (40 mg total) by mouth daily as needed for fluid (for weight gain, 3lbs in 1 day or 5lbs in 1 week).   levalbuterol (XOPENEX HFA) 45 MCG/ACT inhaler Inhale 1 puff into the lungs every 4 (four) hours as needed for wheezing.   losartan (COZAAR) 100 MG tablet Take 100 mg by mouth daily.   memantine (NAMENDA) 10 MG tablet TAKE 1 TABLET BY MOUTH TWICE DAILY.   metoprolol succinate (TOPROL-XL) 100 MG 24 hr tablet Take 1.5 tablets (150 mg total) by mouth in the morning and at bedtime.   polyethylene glycol (MIRALAX / GLYCOLAX) 17 g packet Take 17 g by mouth daily.   sodium chloride (OCEAN) 0.65 % SOLN nasal spray Place 1 spray into both nostrils as needed for congestion.   triamcinolone (NASACORT) 55 MCG/ACT AERO nasal inhaler Place 2 sprays into the nose daily.   vitamin B-12 (CYANOCOBALAMIN) 1000 MCG tablet Take 1 tablet (1,000 mcg total) by mouth daily.   No facility-administered encounter medications on file as of 05/04/2022.    Review of Systems:  Review of Systems  Constitutional:  Negative for activity change and appetite change.  HENT:  Positive for congestion, postnasal drip and rhinorrhea.   Respiratory:  Positive for cough. Negative for shortness of breath.   Cardiovascular:  Negative for leg swelling.  Gastrointestinal:  Negative for constipation.  Genitourinary: Negative.   Musculoskeletal:  Positive for gait problem. Negative for arthralgias and myalgias.  Skin: Negative.   Neurological:  Negative for dizziness and weakness.  Psychiatric/Behavioral:  Positive for confusion. Negative for dysphoric mood and sleep disturbance.     Health Maintenance  Topic Date Due   Medicare Annual Wellness (AWV)  12/30/2021   FOOT EXAM   04/02/2022   OPHTHALMOLOGY EXAM  05/04/2022 (Originally 07/11/2020)   COVID-19 Vaccine (7 - 2023-24 season) 05/19/2022 (Originally 01/06/2022)   INFLUENZA VACCINE  08/12/2022   HEMOGLOBIN A1C  10/29/2022   DTaP/Tdap/Td (2 - Td or Tdap) 07/20/2027   Pneumonia Vaccine 65+ Years old  Completed   DEXA SCAN  Completed   Zoster Vaccines- Shingrix  Completed   HPV VACCINES  Aged Out    Physical Exam: Vitals:   05/04/22 1022  BP: 126/82  Pulse: 94  Resp: 16  Temp: 97.6 F (36.4 C)  TempSrc: Temporal  SpO2: 94%  Height:  (1.727 m)   Body mass index is 27.06 kg/m. Physical Exam Vitals reviewed.  Constitutional:      Appearance: Normal appearance.  HENT:     Head: Normocephalic.     Nose: Congestion and rhinorrhea present.  Mouth/Throat:     Mouth: Mucous membranes are moist.     Pharynx: Oropharynx is clear.  Eyes:     Pupils: Pupils are equal, round, and reactive to light.  Cardiovascular:     Rate and Rhythm: Normal rate and regular rhythm.     Pulses: Normal pulses.     Heart sounds: Normal heart sounds. No murmur heard. Pulmonary:     Effort: Pulmonary effort is normal.     Breath sounds: Normal breath sounds.  Abdominal:     General: Abdomen is flat. Bowel sounds are normal.     Palpations: Abdomen is soft.  Musculoskeletal:        General: Swelling present.     Cervical back: Neck supple.     Comments: Right swelling More then left Foot Exam Mild Sensory loss Small Callus in small toes both Feet Good Pulses Bilateral No Open Wounds  Skin:    General: Skin is warm.  Neurological:     General: No focal deficit present.     Mental Status: She is alert and oriented to person, place, and time.  Psychiatric:        Mood and Affect: Mood normal.        Thought Content: Thought content normal.    Labs reviewed: Basic Metabolic Panel: Recent Labs    09/09/21 1443 09/10/21 0322 09/11/21 0435 09/15/21 0000 04/29/22 0000  NA 140 140 140 138 142  K  4.4 3.4* 4.6 4.5 4.3  CL 103 102 102 102  --   CO2 24 26 26  23* 23*  GLUCOSE 105* 131* 115*  --   --   BUN 24* 24* 28* 41* 23*  CREATININE 1.31* 1.33* 1.75* 1.5* 1.1  CALCIUM 9.3 9.0 9.1 9.1 9.2  TSH  --  0.468  --   --  1.07   Liver Function Tests: Recent Labs    04/29/22 0000  AST 19  ALT 14  ALKPHOS 102  ALBUMIN 3.9   No results for input(s): "LIPASE", "AMYLASE" in the last 8760 hours. No results for input(s): "AMMONIA" in the last 8760 hours. CBC: Recent Labs    09/09/21 1443 09/15/21 0000 04/29/22 0000  WBC 9.0 7.9 7.2  HGB 13.7 13.3 15.0  HCT 42.2 41 46  MCV 93.8  --   --   PLT 253 246 180   Lipid Panel: Recent Labs    09/15/21 0000 04/29/22 0000  CHOL 189 159  HDL 49 62  LDLCALC 103 73  TRIG 188* 120   Lab Results  Component Value Date   HGBA1C 7.7 04/29/2022    Procedures since last visit: No results found.  Assessment/Plan 1. Type 2 diabetes mellitus with stage 3b chronic kidney disease, without long-term current use of insulin Good Control for her age Eye Exam Annually  [name]   2. Chronic diastolic congestive heart failure Can take Lasix 20 mg PRN for LE edema  3. Hypercholesterolemia On statin LDL 73  4. Stage 3a chronic kidney disease Creat stable  5. Paroxysmal atrial tachycardia On Toprol  6. Mild episode of recurrent major depressive disorder Doing well No Meds   7. Essential hypertension Cozaar and Toprol  8. Moderate dementia,  Lives in [place] with help of 24 houres caregivers Aricept and Namenda 9. ILD (interstitial lung disease) No Further treatment On Trelegy  10. Age-related osteoporosis without current pathological fracture No DEXA as she is not ambulatory 11 Allergic Rhinitis Nasocort and Allegra   Labs/tests ordered:  CBC,CMP,  A1C Next appt:  05/14/2022

## 2022-05-13 NOTE — Progress Notes (Signed)
This service is provided via telemedicine  No vital signs collected/recorded due to the encounter was a telemedicine visit.   Location of patient (ex: home, work):  Home   Patient consents to a video visit:  Yes, 01/01/2021   Location of the provider (ex: office, home):  [facility]  Name of any referring provider:  N/A  Names of all persons participating in the telemedicine service and their role in the encounter: Bethany B/CMA, Ethyn Schetter C. Grayce Sessions, NP , and patient  Time spent on call:  11 minutes     This is a video visit  Provider location:  [facility]  Patient location:  Home    Subjective:   Kelsey Salinas is a 89 y.o. female who presents for Medicare Annual (Subsequent) preventive examination.  Review of Systems     Cardiac Risk Factors include: diabetes mellitus;dyslipidemia;hypertension     Objective:    Today's Vitals   05/14/22 1317  Weight: 178 lb (80.7 kg)  Height: 5\' 8"  (1.727 m)   Body mass index is 27.06 kg/m.     05/14/2022   12:59 PM 05/04/2022   10:24 AM 12/29/2021    9:22 AM 06/17/2021    8:40 AM 05/13/2021    9:12 AM 04/01/2021   10:16 AM 12/10/2020   12:15 AM  Advanced Directives  Does Patient Have a Medical Advance Directive? Yes Yes Yes Yes Yes Yes Yes  Type of Estate agent of [place];Living will Healthcare Power of [place];Living will Healthcare Power of [place];Living will Healthcare Power of [place];Living will Healthcare Power of [place];Living will Healthcare Power of [place];Living will Healthcare Power of Attorney  Does patient want to make changes to medical advance directive? No - Patient declined  No - Patient declined No - Patient declined No - Patient declined No - Patient declined No - Patient declined  Copy of Healthcare Power of Attorney in Chart? No - copy requested No - copy requested  Yes - validated most recent copy scanned in chart (See row information) Yes -  validated most recent copy scanned in chart (See row information) Yes - validated most recent copy scanned in chart (See row information) No - copy requested  Would patient like information on creating a medical advance directive?       No - Patient declined    Current Medications (verified) Outpatient Encounter Medications as of 05/14/2022  Medication Sig   acetaminophen (TYLENOL) 500 MG tablet Take 500 mg by mouth every 6 (six) hours as needed for mild pain, headache or fever.   atorvastatin (LIPITOR) 40 MG tablet TAKE 1 TABLET ONCE DAILY.   diclofenac Sodium (VOLTAREN) 1 % GEL Apply 2 g topically 4 (four) times daily.   donepezil (ARICEPT) 10 MG tablet TAKE ONE TABLET BY MOUTH AT BEDTIME   empagliflozin ([name]) 10 MG TABS tablet Take 1 tablet (10 mg total) by mouth daily before breakfast.   famotidine (PEPCID) 20 MG tablet Take 20 mg by mouth daily.   fexofenadine (ALLEGRA) 180 MG tablet Take 1 tablet (180 mg total) by mouth daily.   Fluticasone-Umeclidin-Vilant (TRELEGY ELLIPTA) 200-62.5-25 MCG/ACT AEPB Inhale 1 puff into the lungs daily.   furosemide (LASIX) 40 MG tablet Take 1 tablet (40 mg total) by mouth daily as needed for fluid (for weight gain, 3lbs in 1 day or 5lbs in 1 week).   levalbuterol (XOPENEX HFA) 45 MCG/ACT inhaler Inhale 1 puff into the lungs every 4 (four) hours as needed for wheezing.  losartan (COZAAR) 100 MG tablet Take 100 mg by mouth daily.   memantine (NAMENDA) 10 MG tablet TAKE 1 TABLET BY MOUTH TWICE DAILY.   metoprolol succinate (TOPROL-XL) 100 MG 24 hr tablet Take 1.5 tablets (150 mg total) by mouth in the morning and at bedtime.   polyethylene glycol (MIRALAX / GLYCOLAX) 17 g packet Take 17 g by mouth daily.   sodium chloride (OCEAN) 0.65 % SOLN nasal spray Place 1 spray into both nostrils as needed for congestion.   triamcinolone (NASACORT) 55 MCG/ACT AERO nasal inhaler Place 2 sprays into the nose daily.   vitamin B-12 (CYANOCOBALAMIN) 1000 MCG tablet  Take 1 tablet (1,000 mcg total) by mouth daily.   No facility-administered encounter medications on file as of 05/14/2022.    Allergies (verified) Amlodipine besylate, Lisinopril, and Sulfamethoxazole-trimethoprim   History: Past Medical History:  Diagnosis Date   Abscess of right leg 12/24/2020   Childhood asthma    Chronic diastolic heart failure (HCC) 12/25/2012   October 2013 echocardiogram 1. There is moderate concentric left ventricle hypertrophy. 2. Left ventricular ejection fraction estimated by 2D at 60-65 percent. 3. There were no regional wall motion abnormalities. 4. Mild mitral annular calcification. 5. Mild mitral valve regurgitation. 6. There is mild tricuspid regurgitation. 7. Mildly elevated estimated right ventricular systolic pressure. 8. Right ventricular systolic pressure estimated at 35-40 mm Hg. 9. Mild calcification of the aortic valve. 10. Trace aortic valve regurgitation. 11. Analysis of mitral valve inflow, pulmonary vein Doppler and tissue Doppler suggests grade I diastolic dysfunction without elevated left atrial pressure.    Colon polyp    Diverticular stricture (HCC) 2009   Diverticulosis 10/30/2007   DM (diabetes mellitus), type 2 (HCC)    Edema 09/12/2013   Fall 05/18/2017   tripped over wheel of walker; superior and inferior pubic ramus fractures    Hypercholesterolemia    Hypertension    Hypertension, essential    Hypothyroidism    as a teenager   Indigestion    Insomnia    OA (osteoarthritis) of hip    right hip replacement, OA in bilateral knees   Osteopenia    Actonel stopped in 2012 after 5years therapy   Pneumonia 1939   "hospitalized"   Rapid resting heart rate 12/25/2012   Uncertain cause. Possible ectopic atrial tachycardia. Date entered 12/25/12    Right rotator cuff tear    Seasonal allergies    Past Surgical History:  Procedure Laterality Date   CATARACT EXTRACTION W/ INTRAOCULAR LENS  IMPLANT, BILATERAL Bilateral    COLONOSCOPY   2009   Dr. Melchor Amour   DILATION AND CURETTAGE OF UTERUS     FRACTURE SURGERY     JOINT REPLACEMENT     ORIF WRIST FRACTURE Right    REDUCTION MAMMAPLASTY  1999   TOTAL HIP ARTHROPLASTY Right 03-04-08   Dr. Lequita Halt   Family History  Problem Relation Age of Onset   Heart disease Mother    Heart disease Father    Heart disease Brother    Social History   Socioeconomic History   Marital status: Married    Spouse name: Not on file   Number of children: Not on file   Years of education: Not on file   Highest education level: Not on file  Occupational History   Occupation: retired travel agent  Tobacco Use   Smoking status: Former    Packs/day: 0.12    Years: 50.00    Additional pack years: 0.00    Total  pack years: 6.00    Types: Cigarettes   Smokeless tobacco: Never   Tobacco comments:    "stopped in the ate 1990s"  Vaping Use   Vaping Use: Never used  Substance and Sexual Activity   Alcohol use: Yes    Comment: rarely   Drug use: Never   Sexual activity: Not Currently  Other Topics Concern   Not on file  Social History Narrative   Lives at [place] since 2010   Husband Adela Glimpse married 1952 (husband in memory care unit at [employer])   Former smoker 1/2 PPD, for 30 years, stopped 01/10/1989   Alcohol 2 glasses of wine at night   Exercise: machines 3 times a week and daily walk   No POA/LW         Social Determinants of Health   Financial Resource Strain: Low Risk  (07/19/2017)   Overall Financial Resource Strain (CARDIA)    Difficulty of Paying Living Expenses: Not hard at all  Food Insecurity: No Food Insecurity (07/19/2017)   Hunger Vital Sign    Worried About Running Out of Food in the Last Year: Never true    Ran Out of Food in the Last Year: Never true  Transportation Needs: No Transportation Needs (07/19/2017)   PRAPARE - Administrator, Civil Service (Medical): No    Lack of Transportation (Non-Medical): No  Physical Activity: Insufficiently  Active (07/19/2017)   Exercise Vital Sign    Days of Exercise per Week: 7 days    Minutes of Exercise per Session: 20 min  Stress: Stress Concern Present (07/19/2017)   [employer] of Occupational Health - Occupational Stress Questionnaire    Feeling of Stress : To some extent  Social Connections: Socially Integrated (07/19/2017)   Social Connection and Isolation Panel [NHANES]    Frequency of Communication with Friends and Family: More than three times a week    Frequency of Social Gatherings with Friends and Family: More than three times a week    Attends Religious Services: More than 4 times per year    Active Member of [employer] or Organizations: Yes    Attends Banker Meetings: Never    Marital Status: Married    Tobacco Counseling Counseling given: Not Answered Tobacco comments: "stopped in the ate 1990s"   Clinical Intake:  Pre-visit preparation completed: No        BMI - recorded: 178 Nutritional Risks: None Diabetes: Yes CBG done?: No Did pt. bring in CBG monitor from home?: No  How often do you need to have someone help you when you read instructions, pamphlets, or other written materials from your doctor or pharmacy?: 1 - Never What is the last grade level you completed in school?: graduated from colleg  Diabetic?Yes  Interpreter Needed?: No  Information entered by :: Teodora Baumgarten Medina-Vargas DNP   Activities of Daily Living    05/14/2022    1:01 PM  In your present state of health, do you have any difficulty performing the following activities:  Hearing? 0  Vision? 0  Difficulty concentrating or making decisions? 0  Walking or climbing stairs? 0  Dressing or bathing? 0  Doing errands, shopping? 0  Preparing Food and eating ? N  Using the Toilet? N  In the past six months, have you accidently leaked urine? N  Do you have problems with loss of bowel control? N  Managing your Medications? N  Managing your Finances? Y  Comment daughter does  Scientist, water quality  or managing your Housekeeping? N  Comment has a housekeeper    Patient Care Team: [name], MD as PCP - General (Internal Medicine) Jodelle Red, MD as PCP - Cardiology (Cardiology) Ollen Gross, MD as Consulting Physician (Orthopedic Surgery) Lyn Records, MD (Inactive) as Consulting Physician (Cardiology) Mateo Flow, MD as Consulting Physician (Ophthalmology) Salvatore Marvel, MD as Consulting Physician (Orthopedic Surgery)  Indicate any recent Medical Services you may have received from other than [facility] providers in the past year (date may be approximate).     Assessment:   This is a routine wellness examination for [place].  Hearing/Vision screen Vision Screening - Comments:: April 2024  Dietary issues and exercise activities discussed: Current Exercise Habits: Home exercise routine, Type of exercise: stretching;strength training/weights, Time (Minutes): 30, Frequency (Times/Week): 2, Weekly Exercise (Minutes/Week): 60, Intensity: Mild, Exercise limited by: cardiac condition(s)   Goals Addressed             This Visit's Progress    Exercise 3x per week (30 min per time)         Depression Screen    05/14/2022    1:04 PM 05/04/2022   10:24 AM 12/29/2021    9:22 AM 05/13/2021    9:24 AM 12/30/2020   12:54 PM 12/24/2020    3:05 PM 12/25/2019    9:25 AM  PHQ 2/9 Scores  PHQ - 2 Score 0 0 0 1 3 0 0  PHQ- 9 Score 0    5      Fall Risk    05/14/2022   12:59 PM 05/04/2022   10:24 AM 12/29/2021    9:22 AM 05/13/2021    9:11 AM 04/01/2021   10:16 AM  Fall Risk   Falls in the past year? 0 0 0 0 0  Number falls in past yr: 0 0 0 0 0  Injury with Fall? 0 0 0 0 0  Risk for fall due to : Impaired balance/gait;Impaired mobility Impaired balance/gait;Impaired mobility No Fall Risks No Fall Risks No Fall Risks  Follow up  Falls evaluation completed Falls evaluation completed Falls evaluation completed Falls evaluation completed     FALL RISK PREVENTION PERTAINING TO THE HOME:  Any stairs in or around the home? No  If so, are there any without handrails? No  Home free of loose throw rugs in walkways, pet beds, electrical cords, etc? Yes  Adequate lighting in your home to reduce risk of falls? Yes   ASSISTIVE DEVICES UTILIZED TO PREVENT FALLS:  Life alert? Yes  Use of a cane, walker or w/c? Yes  Grab bars in the bathroom? Yes  Shower chair or bench in shower? Yes  Elevated toilet seat or a handicapped toilet? Yes   TIMED UP AND GO:  Was the test performed? No .  Length of time to ambulate 10 feet: N/A sec.   Gait steady and fast with assistive device  Cognitive Function:    07/23/2020    9:18 AM 05/09/2019    9:53 AM 07/19/2017   12:06 PM 04/07/2016   10:49 AM 04/02/2015   10:13 AM  MMSE - Mini Mental State Exam  Orientation to time 4 4 4 5 5   Orientation to Place 5 5 5 5 5   Registration 3 3 3 3 3   Attention/ Calculation 1 5 5 5 5   Recall 2 3 1 3 3   Language- name 2 objects 2 2 2 2 2   Language- repeat 1 1 1 1 1   Language- follow  3 step command 3 3 3 3 3   Language- read & follow direction 1 1 1 1 1   Write a sentence 1 1 1 1 1   Copy design 1 1 1 1 1   Total score 24 29 27 30 30         05/14/2022    1:00 PM 12/30/2020   12:56 PM 12/25/2019    9:26 AM 12/20/2018   10:10 AM  6CIT Screen  What Year? 0 points 4 points 0 points 0 points  What month? 0 points 0 points 0 points 0 points  What time? 0 points 0 points 0 points 0 points  Count back from 20 0 points 0 points 0 points 0 points  Months in reverse 0 points 0 points 0 points 0 points  Repeat phrase 4 points 6 points 8 points 0 points  Total Score 4 points 10 points 8 points 0 points    Immunizations Immunization History  Administered Date(s) Administered   Fluad Quad(high Dose 65+) 10/13/2021   Influenza, High Dose Seasonal PF 10/20/2018, 11/09/2019   Influenza,inj,Quad PF,6+ Mos 11/04/2017   Influenza-Unspecified 10/25/2013,  10/31/2014, 11/06/2015, 11/01/2016, 10/31/2020   Moderna Covid-19 Vaccine Bivalent Booster 77yrs & up 10/24/2020   Moderna SARS-COV2 Booster Vaccination 11/27/2019   Moderna Sars-Covid-2 Vaccination 01/23/2019, 02/20/2019, 06/15/2021, 11/11/2021   Pneumococcal Conjugate-13 06/04/2014   Pneumococcal Polysaccharide-23 01/12/2000   Tdap 07/19/2017   Zoster Recombinat (Shingrix) 05/21/2021, 09/16/2021    TDAP status: Up to date  Flu Vaccine status: Up to date  Pneumococcal vaccine status: Up to date  Covid-19 vaccine status: Completed vaccines  Qualifies for Shingles Vaccine? Yes   Zostavax completed Yes   Shingrix Completed?: Yes  Screening Tests Health Maintenance  Topic Date Due   OPHTHALMOLOGY EXAM  07/11/2020   COVID-19 Vaccine (7 - 2023-24 season) 05/19/2022 (Originally 01/06/2022)   INFLUENZA VACCINE  08/12/2022   HEMOGLOBIN A1C  10/29/2022   FOOT EXAM  05/04/2023   Medicare Annual Wellness (AWV)  05/14/2023   DTaP/Tdap/Td (2 - Td or Tdap) 07/20/2027   Pneumonia Vaccine 88+ Years old  Completed   DEXA SCAN  Completed   Zoster Vaccines- Shingrix  Completed   HPV VACCINES  Aged Out    Health Maintenance  Health Maintenance Due  Topic Date Due   OPHTHALMOLOGY EXAM  07/11/2020    Colorectal cancer screening: Type of screening: Colonoscopy. Completed don't remember. Repeat every don't remember years  Mammogram status: No longer required due to don't remember.  Bone Density status: Completed don't remem. Results reflect: Bone density results: NORMAL. Repeat every don't remmber years.  Lung Cancer Screening: (Low Dose CT Chest recommended if Age 85-80 years, 30 pack-year currently smoking OR have quit w/in 15years.) does not qualify.   Lung Cancer Screening Referral: No  Additional Screening:  Hepatitis C Screening: does not qualify; Completed aged out  Vision Screening: Recommended annual ophthalmology exams for early detection of glaucoma and other disorders  of the eye. Is the patient up to date with their annual eye exam?  Yes  Who is the provider or what is the name of the office in which the patient attends annual eye exams?  [facility] If pt is not established with a provider, would they like to be referred to a provider to establish care? No .   Dental Screening: Recommended annual dental exams for proper oral hygiene  Community Resource Referral / Chronic Care Management: CRR required this visit?  No   CCM required this visit?  No  Plan:     I have personally reviewed and noted the following in the patient's chart:   Medical and social history Use of alcohol, tobacco or illicit drugs  Current medications and supplements including opioid prescriptions. Patient is not currently taking opioid prescriptions. Functional ability and status Nutritional status Physical activity Advanced directives List of other physicians Hospitalizations, surgeries, and ER visits in previous 12 months Vitals Screenings to include cognitive, depression, and falls Referrals and appointments  In addition, I have reviewed and discussed with patient certain preventive protocols, quality metrics, and best practice recommendations. A written personalized care plan for preventive services as well as general preventive health recommendations were provided to patient.     Phil Corti Medina-Vargas, NP   05/14/2022   Nurse Notes:  Needs to be done annually.

## 2022-05-14 ENCOUNTER — Encounter: Payer: Self-pay | Admitting: Adult Health

## 2022-05-14 ENCOUNTER — Ambulatory Visit (INDEPENDENT_AMBULATORY_CARE_PROVIDER_SITE_OTHER): Payer: Medicare Other | Admitting: Adult Health

## 2022-05-14 VITALS — Ht 68.0 in | Wt 178.0 lb

## 2022-05-14 DIAGNOSIS — Z0000 Encounter for general adult medical examination without abnormal findings: Secondary | ICD-10-CM | POA: Diagnosis not present

## 2022-05-14 NOTE — Patient Instructions (Signed)
  Kelsey Salinas , Thank you for taking time to come for your Medicare Wellness Visit. I appreciate your ongoing commitment to your health goals. Please review the following plan we discussed and let me know if I can assist you in the future.   These are the goals we discussed:  Goals      Exercise 3x per week (30 min per time)     Patient Stated     I want to be independent         This is a list of the screening recommended for you and due dates:  Health Maintenance  Topic Date Due   Eye exam for diabetics  07/11/2020   COVID-19 Vaccine (7 - 2023-24 season) 05/19/2022*   Flu Shot  08/12/2022   Hemoglobin A1C  10/29/2022   Complete foot exam   05/04/2023   Medicare Annual Wellness Visit  05/14/2023   DTaP/Tdap/Td vaccine (2 - Td or Tdap) 07/20/2027   Pneumonia Vaccine  Completed   DEXA scan (bone density measurement)  Completed   Zoster (Shingles) Vaccine  Completed   HPV Vaccine  Aged Out  *Topic was postponed. The date shown is not the original due date.

## 2022-07-19 ENCOUNTER — Other Ambulatory Visit: Payer: Self-pay | Admitting: Cardiology

## 2022-07-19 NOTE — Telephone Encounter (Signed)
Rx(s) sent to pharmacy electronically.  

## 2022-08-24 DIAGNOSIS — I5032 Chronic diastolic (congestive) heart failure: Secondary | ICD-10-CM | POA: Diagnosis not present

## 2022-08-24 DIAGNOSIS — E7800 Pure hypercholesterolemia, unspecified: Secondary | ICD-10-CM | POA: Diagnosis not present

## 2022-08-24 DIAGNOSIS — E1122 Type 2 diabetes mellitus with diabetic chronic kidney disease: Secondary | ICD-10-CM | POA: Diagnosis not present

## 2022-08-24 LAB — CBC AND DIFFERENTIAL
HCT: 47 — AB (ref 36–46)
Hemoglobin: 15.5 (ref 12.0–16.0)
Platelets: 185 10*3/uL (ref 150–400)
WBC: 7.9

## 2022-08-24 LAB — HEMOGLOBIN A1C: Hemoglobin A1C: 7.7

## 2022-08-24 LAB — BASIC METABOLIC PANEL
BUN: 26 — AB (ref 4–21)
Chloride: 107 (ref 99–108)
Creatinine: 1.1 (ref 0.5–1.1)
Glucose: 117
Potassium: 4.6 mEq/L (ref 3.5–5.1)
Sodium: 141 (ref 137–147)

## 2022-08-24 LAB — HEPATIC FUNCTION PANEL
ALT: 14 U/L (ref 7–35)
AST: 23 (ref 13–35)
Alkaline Phosphatase: 108 (ref 25–125)
Bilirubin, Total: 0.5

## 2022-08-24 LAB — COMPREHENSIVE METABOLIC PANEL
Albumin: 4 (ref 3.5–5.0)
Calcium: 9.3 (ref 8.7–10.7)
Globulin: 2.2
eGFR: 45

## 2022-08-24 LAB — CBC: RBC: 4.96 (ref 3.87–5.11)

## 2022-08-31 ENCOUNTER — Non-Acute Institutional Stay: Payer: Medicare Other | Admitting: Internal Medicine

## 2022-08-31 ENCOUNTER — Encounter: Payer: Self-pay | Admitting: Internal Medicine

## 2022-08-31 VITALS — BP 136/82 | HR 89 | Temp 97.6°F | Resp 17 | Ht 68.0 in | Wt 178.0 lb

## 2022-08-31 DIAGNOSIS — N1832 Chronic kidney disease, stage 3b: Secondary | ICD-10-CM

## 2022-08-31 DIAGNOSIS — I4719 Other supraventricular tachycardia: Secondary | ICD-10-CM | POA: Diagnosis not present

## 2022-08-31 DIAGNOSIS — F03B0 Unspecified dementia, moderate, without behavioral disturbance, psychotic disturbance, mood disturbance, and anxiety: Secondary | ICD-10-CM

## 2022-08-31 DIAGNOSIS — I10 Essential (primary) hypertension: Secondary | ICD-10-CM | POA: Diagnosis not present

## 2022-08-31 DIAGNOSIS — F330 Major depressive disorder, recurrent, mild: Secondary | ICD-10-CM

## 2022-08-31 DIAGNOSIS — E1122 Type 2 diabetes mellitus with diabetic chronic kidney disease: Secondary | ICD-10-CM

## 2022-08-31 DIAGNOSIS — I5032 Chronic diastolic (congestive) heart failure: Secondary | ICD-10-CM | POA: Diagnosis not present

## 2022-08-31 DIAGNOSIS — J849 Interstitial pulmonary disease, unspecified: Secondary | ICD-10-CM

## 2022-08-31 DIAGNOSIS — N1831 Chronic kidney disease, stage 3a: Secondary | ICD-10-CM | POA: Diagnosis not present

## 2022-08-31 DIAGNOSIS — E7800 Pure hypercholesterolemia, unspecified: Secondary | ICD-10-CM | POA: Diagnosis not present

## 2022-08-31 DIAGNOSIS — M810 Age-related osteoporosis without current pathological fracture: Secondary | ICD-10-CM

## 2022-08-31 NOTE — Progress Notes (Signed)
Location:  [facility] features editor of Service:  Clinic (12)  Provider:   Code Status: DNR Goals of Care:     08/31/2022   10:43 AM  Advanced Directives  Does Patient Have a Medical Advance Directive? Yes  Type of Estate agent of [place];Living will  Does patient want to make changes to medical advance directive? No - Patient declined  Copy of Healthcare Power of Attorney in Chart? No - copy requested     Chief Complaint  Patient presents with   Medical Management of Chronic Issues    Patient is being seen for 4 month follow up   Immunizations    Patient is due for covid and flu vaccine     HPI: Patient is a 89 y.o. female seen today for medical management of chronic diseases.   Lives in [place] with 24/7 caregivers   Patient has a history of Alzheimer's dementia, diastolic CHF, HLD, arthritis, history of osteoporosis, allergic rhinitis  And recent diagnosis of ILD Refused treatment or further work up  Had no Complains Does have some Cough and Runny nose but no other issues Today Stays in her Wheelchair Walks with assist and Holding Furniture Full time caregiver with her No depression Weight is stable Wt Readings from Last 3 Encounters:  08/31/22 178 lb (80.7 kg)  05/14/22 178 lb (80.7 kg)  05/04/22 176 lb 6.4 oz (80 kg)        Past Medical History:  Diagnosis Date   Abscess of right leg 12/24/2020   Childhood asthma    Chronic diastolic heart failure (HCC) 12/25/2012   October 2013 echocardiogram 1. There is moderate concentric left ventricle hypertrophy. 2. Left ventricular ejection fraction estimated by 2D at 60-65 percent. 3. There were no regional wall motion abnormalities. 4. Mild mitral annular calcification. 5. Mild mitral valve regurgitation. 6. There is mild tricuspid regurgitation. 7. Mildly elevated estimated right ventricular systolic pressure. 8. Right ventricular systolic pressure estimated at 35-40 mm Hg. 9. Mild  calcification of the aortic valve. 10. Trace aortic valve regurgitation. 11. Analysis of mitral valve inflow, pulmonary vein Doppler and tissue Doppler suggests grade I diastolic dysfunction without elevated left atrial pressure.    Colon polyp    Diverticular stricture (HCC) 2009   Diverticulosis 10/30/2007   DM (diabetes mellitus), type 2 (HCC)    Edema 09/12/2013   Fall 05/18/2017   tripped over wheel of walker; superior and inferior pubic ramus fractures    Hypercholesterolemia    Hypertension    Hypertension, essential    Hypothyroidism    as a teenager   Indigestion    Insomnia    OA (osteoarthritis) of hip    right hip replacement, OA in bilateral knees   Osteopenia    Actonel stopped in 2012 after 5years therapy   Pneumonia 1939   "hospitalized"   Rapid resting heart rate 12/25/2012   Uncertain cause. Possible ectopic atrial tachycardia. Date entered 12/25/12    Right rotator cuff tear    Seasonal allergies     Past Surgical History:  Procedure Laterality Date   CATARACT EXTRACTION W/ INTRAOCULAR LENS  IMPLANT, BILATERAL Bilateral    COLONOSCOPY  2009   Dr. Melchor Amour   DILATION AND CURETTAGE OF UTERUS     FRACTURE SURGERY     JOINT REPLACEMENT     ORIF WRIST FRACTURE Right    REDUCTION MAMMAPLASTY  1999   TOTAL HIP ARTHROPLASTY Right 03-04-08   Dr. Lequita Halt  Allergies  Allergen Reactions   Amlodipine Besylate Other (See Comments)    Edema   Lisinopril Cough    Cough    Sulfamethoxazole-Trimethoprim Other (See Comments)    Unknown reaction     Outpatient Encounter Medications as of 08/31/2022  Medication Sig   acetaminophen (TYLENOL) 500 MG tablet Take 500 mg by mouth every 6 (six) hours as needed for mild pain, headache or fever.   atorvastatin (LIPITOR) 40 MG tablet TAKE 1 TABLET ONCE DAILY.   diclofenac Sodium (VOLTAREN) 1 % GEL Apply 2 g topically 4 (four) times daily.   donepezil (ARICEPT) 10 MG tablet TAKE ONE TABLET BY MOUTH AT BEDTIME    empagliflozin ([name]) 10 MG TABS tablet Take 1 tablet (10 mg total) by mouth daily before breakfast.   famotidine (PEPCID) 20 MG tablet Take 20 mg by mouth daily.   fexofenadine (ALLEGRA) 180 MG tablet Take 1 tablet (180 mg total) by mouth daily.   Fluticasone-Umeclidin-Vilant (TRELEGY ELLIPTA) 200-62.5-25 MCG/ACT AEPB Inhale 1 puff into the lungs daily.   furosemide (LASIX) 40 MG tablet Take 1 tablet (40 mg total) by mouth daily as needed for fluid (for weight gain, 3lbs in 1 day or 5lbs in 1 week).   levalbuterol (XOPENEX HFA) 45 MCG/ACT inhaler Inhale 1 puff into the lungs every 4 (four) hours as needed for wheezing.   losartan (COZAAR) 100 MG tablet Take 100 mg by mouth daily.   memantine (NAMENDA) 10 MG tablet TAKE 1 TABLET BY MOUTH TWICE DAILY.   metoprolol succinate (TOPROL-XL) 100 MG 24 hr tablet TAKE 1 AND 1/2 TABLETS BY MOUTH IN THE MORNING AND AT BEDTIME   polyethylene glycol (MIRALAX / GLYCOLAX) 17 g packet Take 17 g by mouth daily.   sodium chloride (OCEAN) 0.65 % SOLN nasal spray Place 1 spray into both nostrils as needed for congestion.   triamcinolone (NASACORT) 55 MCG/ACT AERO nasal inhaler Place 2 sprays into the nose daily.   vitamin B-12 (CYANOCOBALAMIN) 1000 MCG tablet Take 1 tablet (1,000 mcg total) by mouth daily.   No facility-administered encounter medications on file as of 08/31/2022.    Review of Systems:  Review of Systems  Constitutional:  Negative for activity change and appetite change.  HENT:  Positive for postnasal drip and rhinorrhea.   Respiratory:  Negative for cough and shortness of breath.   Cardiovascular:  Negative for leg swelling.  Gastrointestinal:  Negative for constipation.  Genitourinary: Negative.   Musculoskeletal:  Positive for gait problem. Negative for arthralgias and myalgias.  Skin: Negative.   Neurological:  Negative for dizziness and weakness.  Psychiatric/Behavioral:  Positive for confusion. Negative for dysphoric mood and sleep  disturbance.     Health Maintenance  Topic Date Due   OPHTHALMOLOGY EXAM  07/11/2020   COVID-19 Vaccine (7 - 2023-24 season) 01/06/2022   INFLUENZA VACCINE  08/12/2022   HEMOGLOBIN A1C  02/24/2023   FOOT EXAM  05/04/2023   Medicare Annual Wellness (AWV)  05/14/2023   DTaP/Tdap/Td (2 - Td or Tdap) 07/20/2027   Pneumonia Vaccine 57+ Years old  Completed   DEXA SCAN  Completed   Zoster Vaccines- Shingrix  Completed   HPV VACCINES  Aged Out    Physical Exam: Vitals:   08/31/22 1041  BP: 136/82  Pulse: 89  Resp: 17  Temp: 97.6 F (36.4 C)  TempSrc: Temporal  SpO2: 94%  Weight: 178 lb (80.7 kg)  Height: 5\' 8"  (1.727 m)   Body mass index is 27.06 kg/m. Physical Exam  Vitals reviewed.  Constitutional:      Appearance: Normal appearance.  HENT:     Head: Normocephalic.     Nose: Nose normal.     Mouth/Throat:     Mouth: Mucous membranes are moist.     Pharynx: Oropharynx is clear.  Eyes:     Pupils: Pupils are equal, round, and reactive to light.  Cardiovascular:     Rate and Rhythm: Normal rate and regular rhythm.     Pulses: Normal pulses.     Heart sounds: Normal heart sounds. No murmur heard. Pulmonary:     Effort: Pulmonary effort is normal.     Breath sounds: Normal breath sounds.  Abdominal:     General: Abdomen is flat. Bowel sounds are normal.     Palpations: Abdomen is soft.  Musculoskeletal:        General: No swelling.     Cervical back: Neck supple.     Comments: Corn in Left Foot  Fine Sensory loss in Left Leg   Skin:    General: Skin is warm.  Neurological:     General: No focal deficit present.     Mental Status: She is alert and oriented to person, place, and time.     Comments: Stand and Pivot does not walk anymore  Psychiatric:        Mood and Affect: Mood normal.        Thought Content: Thought content normal.     Labs reviewed: Basic Metabolic Panel: Recent Labs    09/09/21 1443 09/10/21 0322 09/11/21 0435 09/15/21 0000  04/29/22 0000 08/24/22 0000  NA 140 140 140 138 142 141  K 4.4 3.4* 4.6 4.5 4.3 4.6  CL 103 102 102 102  --  107  CO2 24 26 26  23* 23*  --   GLUCOSE 105* 131* 115*  --   --   --   BUN 24* 24* 28* 41* 23* 26*  CREATININE 1.31* 1.33* 1.75* 1.5* 1.1 1.1  CALCIUM 9.3 9.0 9.1 9.1 9.2 9.3  TSH  --  0.468  --   --  1.07  --    Liver Function Tests: Recent Labs    04/29/22 0000 08/24/22 0000  AST 19 23  ALT 14 14  ALKPHOS 102 108  ALBUMIN 3.9 4.0   No results for input(s): "LIPASE", "AMYLASE" in the last 8760 hours. No results for input(s): "AMMONIA" in the last 8760 hours. CBC: Recent Labs    09/09/21 1443 09/15/21 0000 04/29/22 0000 08/24/22 0000  WBC 9.0 7.9 7.2 7.9  HGB 13.7 13.3 15.0 15.5  HCT 42.2 41 46 47*  MCV 93.8  --   --   --   PLT 253 246 180 185   Lipid Panel: Recent Labs    09/15/21 0000 04/29/22 0000  CHOL 189 159  HDL 49 62  LDLCALC 103 73  TRIG 188* 120   Lab Results  Component Value Date   HGBA1C 7.7 08/24/2022    Procedures since last visit: No results found.  Assessment/Plan 1. Type 2 diabetes mellitus with stage 3b chronic kidney disease, without long-term current use of insulin (HCC) A1C good for her age On [name] Needs EYE exam Reminded again today  2. Chronic diastolic congestive heart failure (HCC) Lasix as needed Per Caregiver they haven't used it recently  3. Hypercholesterolemia On statin LDL 73 in 4/24  4. Stage 3a chronic kidney disease (HCC) Creat stable  5. Paroxysmal atrial tachycardia On Metoprolol  6.  Mild episode of recurrent major depressive disorder (HCC) Refused Meds Doing well  7. Essential hypertension Cozaar and Metoprolol  8. ILD (interstitial lung disease) (HCC) Refused further work up Doing well with Trelegy 9. Age-related osteoporosis without current pathological fracture No More treatment as Wheelchair bound  10. Moderate dementia,  Doing well with her caregivers On Aricept and  Namenda  11 Allergic rhinitis On Allegra and Nasocort  Labs/tests ordered:  CBC,CMP,Lipid Before next visit Next appt:  Visit date not found

## 2022-10-14 DIAGNOSIS — Z23 Encounter for immunization: Secondary | ICD-10-CM | POA: Diagnosis not present

## 2022-10-15 ENCOUNTER — Other Ambulatory Visit: Payer: Self-pay

## 2022-10-15 NOTE — Telephone Encounter (Signed)
Requesting 90 day supply.

## 2022-10-18 ENCOUNTER — Other Ambulatory Visit: Payer: Self-pay | Admitting: Internal Medicine

## 2022-10-18 MED ORDER — EMPAGLIFLOZIN 10 MG PO TABS: 10.0000 mg | ORAL_TABLET | Freq: Every day | ORAL | 1 refills | Status: DC

## 2022-11-15 ENCOUNTER — Other Ambulatory Visit: Payer: Self-pay | Admitting: Internal Medicine

## 2022-12-23 ENCOUNTER — Other Ambulatory Visit: Payer: Self-pay | Admitting: Internal Medicine

## 2022-12-23 NOTE — Telephone Encounter (Signed)
Hydrochlorothiazide is not on active medication list. I will send to [name], MD to review and advise if ok to remove comment about hydrochlorothiazide that is on RX instructions

## 2023-01-18 ENCOUNTER — Non-Acute Institutional Stay: Payer: Medicare Other | Admitting: Internal Medicine

## 2023-01-18 ENCOUNTER — Encounter: Payer: Self-pay | Admitting: Internal Medicine

## 2023-01-18 VITALS — BP 132/82 | HR 60 | Temp 98.1°F | Ht 68.0 in

## 2023-01-18 DIAGNOSIS — I10 Essential (primary) hypertension: Secondary | ICD-10-CM

## 2023-01-18 DIAGNOSIS — I5032 Chronic diastolic (congestive) heart failure: Secondary | ICD-10-CM | POA: Diagnosis not present

## 2023-01-18 DIAGNOSIS — E1122 Type 2 diabetes mellitus with diabetic chronic kidney disease: Secondary | ICD-10-CM

## 2023-01-18 DIAGNOSIS — E119 Type 2 diabetes mellitus without complications: Secondary | ICD-10-CM | POA: Diagnosis not present

## 2023-01-18 DIAGNOSIS — N1831 Chronic kidney disease, stage 3a: Secondary | ICD-10-CM

## 2023-01-18 DIAGNOSIS — E7800 Pure hypercholesterolemia, unspecified: Secondary | ICD-10-CM

## 2023-01-18 DIAGNOSIS — J849 Interstitial pulmonary disease, unspecified: Secondary | ICD-10-CM | POA: Diagnosis not present

## 2023-01-18 DIAGNOSIS — I4719 Other supraventricular tachycardia: Secondary | ICD-10-CM

## 2023-01-18 DIAGNOSIS — F03B0 Unspecified dementia, moderate, without behavioral disturbance, psychotic disturbance, mood disturbance, and anxiety: Secondary | ICD-10-CM | POA: Diagnosis not present

## 2023-01-18 DIAGNOSIS — R600 Localized edema: Secondary | ICD-10-CM | POA: Diagnosis not present

## 2023-01-18 DIAGNOSIS — E039 Hypothyroidism, unspecified: Secondary | ICD-10-CM | POA: Diagnosis not present

## 2023-01-18 DIAGNOSIS — F330 Major depressive disorder, recurrent, mild: Secondary | ICD-10-CM | POA: Diagnosis not present

## 2023-01-18 DIAGNOSIS — N1832 Chronic kidney disease, stage 3b: Secondary | ICD-10-CM | POA: Diagnosis not present

## 2023-01-18 LAB — HEPATIC FUNCTION PANEL
ALT: 16 U/L (ref 7–35)
AST: 22 (ref 13–35)
Alkaline Phosphatase: 108 (ref 25–125)
Bilirubin, Total: 0.7

## 2023-01-18 LAB — LIPID PANEL
Cholesterol: 146 (ref 0–200)
HDL: 56 (ref 35–70)
LDL Cholesterol: 65
Triglycerides: 126 (ref 40–160)

## 2023-01-18 LAB — TSH: TSH: 1.29 (ref 0.41–5.90)

## 2023-01-18 LAB — CBC AND DIFFERENTIAL
HCT: 49 — AB (ref 36–46)
Hemoglobin: 15.9 (ref 12.0–16.0)
Platelets: 145 10*3/uL — AB (ref 150–400)
WBC: 9.6

## 2023-01-18 LAB — COMPREHENSIVE METABOLIC PANEL
Albumin: 3.8 (ref 3.5–5.0)
Calcium: 9.2 (ref 8.7–10.7)
Globulin: 2.8

## 2023-01-18 LAB — BASIC METABOLIC PANEL
BUN: 26 — AB (ref 4–21)
CO2: 23 — AB (ref 13–22)
Chloride: 106 (ref 99–108)
Creatinine: 1.3 — AB (ref 0.5–1.1)
Glucose: 126
Potassium: 4.1 meq/L (ref 3.5–5.1)
Sodium: 141 (ref 137–147)

## 2023-01-18 LAB — HEMOGLOBIN A1C: Hemoglobin A1C: 6.8

## 2023-01-18 LAB — CBC: RBC: 5.15 — AB (ref 3.87–5.11)

## 2023-01-18 MED ORDER — POTASSIUM CHLORIDE CRYS ER 20 MEQ PO TBCR: 20.0000 meq | EXTENDED_RELEASE_TABLET | Freq: Once | ORAL | 3 refills | Status: DC | PRN

## 2023-01-18 MED ORDER — TRELEGY ELLIPTA 200-62.5-25 MCG/ACT IN AEPB: 1.0000 | INHALATION_SPRAY | Freq: Every day | RESPIRATORY_TRACT | 5 refills | Status: DC

## 2023-01-18 NOTE — Progress Notes (Signed)
 Location:  [facility] of Service:  Clinic (12)  Provider:   Code Status: DNR Goals of Care:     08/31/2022   10:43 AM  Advanced Directives  Does Patient Have a Medical Advance Directive? Yes  Type of Estate Agent of [place];Living will  Does patient want to make changes to medical advance directive? No - Patient declined  Copy of Healthcare Power of Attorney in Chart? No - copy requested     Chief Complaint  Patient presents with   Medical Management of Chronic Issues    Patient presents today for a 5 month follow-up   Quality Metric Gaps    Eye exam, COVID#7    HPI: Patient is a 89 y.o. female seen today for medical management of chronic diseases.    [place] in [place] with 24/7 Caregivers  Patient has a history of Alzheimer's dementia, diastolic CHF, HLD, arthritis, history of osteoporosis, allergic rhinitis  And recent diagnosis of ILD Refused treatment or further work up  Discussed the use of AI scribe software for clinical note transcription with the patient, who gave verbal consent to proceed.  History of Present Illness   The patient's daughter, a PharmD, initiated the consultation to discuss the patient's extensive medication regimen, with the aim of potentially reducing the pill burden. The patient is on Aricept  and memantine  for Alzheimer's dementia, and the daughter questioned the necessity of both medications. The patient has been stable on both medications, and there was concern about potential decline if either medication was discontinued.  The patient's high dose of metoprolol  was also discussed. The patient has been on this dose for a long time The patient has been stable on this dose, with no signs of bradycardia or dizziness.  The patient's use of Lipitor was also questioned. The patient has diabetes, which increases the risk of coronary artery disease and stroke. The decision to continue Lipitor was  left to the family.  The patient has been experiencing swelling in the legs and feet, making it difficult to wear certain shoes. She refused to be weighted today and refuses at home also so Caregivers not able to give her Lasix   The patient's cough was also discussed. The patient has a history of bronchitis and lung disease, and has not been taking her trelegy as suggested by Pulmonary  The patient's physical activity is limited to transfers and some walking with the assistance of physical therapy.      Past Medical History:  Diagnosis Date   Abscess of right leg 12/24/2020   Childhood asthma    Chronic diastolic heart failure (HCC) 12/25/2012   October 2013 echocardiogram 1. There is moderate concentric left ventricle hypertrophy. 2. Left ventricular ejection fraction estimated by 2D at 60-65 percent. 3. There were no regional wall motion abnormalities. 4. Mild mitral annular calcification. 5. Mild mitral valve regurgitation. 6. There is mild tricuspid regurgitation. 7. Mildly elevated estimated right ventricular systolic pressure. 8. Right ventricular systolic pressure estimated at 35-40 mm Hg. 9. Mild calcification of the aortic valve. 10. Trace aortic valve regurgitation. 11. Analysis of mitral valve inflow, pulmonary vein Doppler and tissue Doppler suggests grade I diastolic dysfunction without elevated left atrial pressure.    Colon polyp    Diverticular stricture (HCC) 2009   Diverticulosis 10/30/2007   DM (diabetes mellitus), type 2 (HCC)    Edema 09/12/2013   Fall 05/18/2017   tripped over wheel of walker; superior and inferior pubic  ramus fractures    Hypercholesterolemia    Hypertension    Hypertension, essential    Hypothyroidism    as a teenager   Indigestion    Insomnia    OA (osteoarthritis) of hip    right hip replacement, OA in bilateral knees   Osteopenia    Actonel stopped in 2012 after 5years therapy   Pneumonia 1939   hospitalized   Rapid resting heart rate  12/25/2012   Uncertain cause. Possible ectopic atrial tachycardia. Date entered 12/25/12    Right rotator cuff tear    Seasonal allergies     Past Surgical History:  Procedure Laterality Date   CATARACT EXTRACTION W/ INTRAOCULAR LENS  IMPLANT, BILATERAL Bilateral    COLONOSCOPY  2009   Dr. Newell   DILATION AND CURETTAGE OF UTERUS     FRACTURE SURGERY     JOINT REPLACEMENT     ORIF WRIST FRACTURE Right    REDUCTION MAMMAPLASTY  1999   TOTAL HIP ARTHROPLASTY Right 03-04-08   Dr. Melodi    Allergies  Allergen Reactions   Amlodipine Besylate Other (See Comments)    Edema   Lisinopril Cough    Cough    Sulfamethoxazole-Trimethoprim Other (See Comments)    Unknown reaction     Outpatient Encounter Medications as of 01/18/2023  Medication Sig   acetaminophen  (TYLENOL ) 500 MG tablet Take 500 mg by mouth every 6 (six) hours as needed for mild pain, headache or fever.   atorvastatin  (LIPITOR) 40 MG tablet TAKE 1 TABLET ONCE DAILY.   diclofenac  Sodium (VOLTAREN ) 1 % GEL Apply 2 g topically 4 (four) times daily.   donepezil  (ARICEPT ) 10 MG tablet TAKE ONE TABLET BY MOUTH AT BEDTIME   empagliflozin  ([name] ) 10 MG TABS tablet Take 1 tablet (10 mg total) by mouth daily before breakfast.   famotidine  (PEPCID ) 20 MG tablet Take 20 mg by mouth daily.   fexofenadine  (ALLEGRA ) 180 MG tablet Take 1 tablet (180 mg total) by mouth daily.   furosemide  (LASIX ) 40 MG tablet Take 1 tablet (40 mg total) by mouth daily as needed for fluid (for weight gain, 3lbs in 1 day or 5lbs in 1 week).   losartan  (COZAAR ) 100 MG tablet Take 100 mg by mouth daily.   memantine  (NAMENDA ) 10 MG tablet TAKE 1 TABLET BY MOUTH TWICE DAILY.   metoprolol  succinate (TOPROL -XL) 100 MG 24 hr tablet TAKE 1 AND 1/2 TABLETS BY MOUTH IN THE MORNING AND AT BEDTIME   polyethylene glycol (MIRALAX  / GLYCOLAX ) 17 g packet Take 17 g by mouth daily.   sodium chloride  (OCEAN) 0.65 % SOLN nasal spray Place 1 spray into both nostrils  as needed for congestion.   triamcinolone  (NASACORT ) 55 MCG/ACT AERO nasal inhaler Place 2 sprays into the nose daily.   vitamin B-12 (CYANOCOBALAMIN ) 1000 MCG tablet Take 1 tablet (1,000 mcg total) by mouth daily.   [DISCONTINUED] Fluticasone -Umeclidin-Vilant (TRELEGY ELLIPTA ) 200-62.5-25 MCG/ACT AEPB Inhale 1 puff into the lungs daily.   [DISCONTINUED] losartan  (COZAAR ) 100 MG tablet TAKE 1 TABLET DAILY WITH HCTZ TIL COMBINATION PILL AVAILABLE. (Patient taking differently: 100 mg daily.)   Fluticasone -Umeclidin-Vilant (TRELEGY ELLIPTA ) 200-62.5-25 MCG/ACT AEPB Inhale 1 puff into the lungs daily.   [DISCONTINUED] levalbuterol  (XOPENEX  HFA) 45 MCG/ACT inhaler Inhale 1 puff into the lungs every 4 (four) hours as needed for wheezing.   No facility-administered encounter medications on file as of 01/18/2023.    Review of Systems:  Review of Systems  Constitutional:  Negative for activity change  and appetite change.  HENT: Negative.    Respiratory:  Positive for cough. Negative for shortness of breath.   Cardiovascular:  Positive for leg swelling.  Gastrointestinal:  Negative for constipation.  Genitourinary: Negative.   Musculoskeletal:  Positive for gait problem. Negative for arthralgias and myalgias.  Skin: Negative.   Neurological:  Positive for weakness. Negative for dizziness.  Psychiatric/Behavioral:  Positive for confusion. Negative for dysphoric mood and sleep disturbance.     Health Maintenance  Topic Date Due   OPHTHALMOLOGY EXAM  07/11/2020   COVID-19 Vaccine (7 - 2024-25 season) 09/12/2022   HEMOGLOBIN A1C  02/24/2023   FOOT EXAM  05/04/2023   Medicare Annual Wellness (AWV)  05/14/2023   DTaP/Tdap/Td (2 - Td or Tdap) 07/20/2027   Pneumonia Vaccine 11+ Years old  Completed   INFLUENZA VACCINE  Completed   DEXA SCAN  Completed   Zoster Vaccines- Shingrix  Completed   HPV VACCINES  Aged Out    Physical Exam: Vitals:   01/18/23 1044  BP: 132/82  Pulse: 60  Temp: 98.1  F (36.7 C)  SpO2: 97%  Height: 5' 8 (1.727 m)   Body mass index is 27.06 kg/m. Physical Exam Vitals reviewed.  Constitutional:      Appearance: Normal appearance.  HENT:     Head: Normocephalic.     Nose: Nose normal.     Mouth/Throat:     Mouth: Mucous membranes are moist.     Pharynx: Oropharynx is clear.  Eyes:     Pupils: Pupils are equal, round, and reactive to light.  Cardiovascular:     Rate and Rhythm: Normal rate and regular rhythm.     Pulses: Normal pulses.     Heart sounds: Normal heart sounds. No murmur heard. Pulmonary:     Effort: Pulmonary effort is normal.     Breath sounds: Rales present.  Abdominal:     General: Abdomen is flat. Bowel sounds are normal.     Palpations: Abdomen is soft.  Musculoskeletal:        General: Swelling present.     Cervical back: Neck supple.  Skin:    General: Skin is warm.  Neurological:     General: No focal deficit present.     Mental Status: She is alert.  Psychiatric:        Mood and Affect: Mood normal.        Thought Content: Thought content normal.    .mm Labs reviewed: Basic Metabolic Panel: Recent Labs    04/29/22 0000 08/24/22 0000  NA 142 141  K 4.3 4.6  CL  --  107  CO2 23*  --   BUN 23* 26*  CREATININE 1.1 1.1  CALCIUM  9.2 9.3  TSH 1.07  --    Liver Function Tests: Recent Labs    04/29/22 0000 08/24/22 0000  AST 19 23  ALT 14 14  ALKPHOS 102 108  ALBUMIN 3.9 4.0   No results for input(s): LIPASE, AMYLASE in the last 8760 hours. No results for input(s): AMMONIA in the last 8760 hours. CBC: Recent Labs    04/29/22 0000 08/24/22 0000  WBC 7.2 7.9  HGB 15.0 15.5  HCT 46 47*  PLT 180 185   Lipid Panel: Recent Labs    04/29/22 0000  CHOL 159  HDL 62  LDLCALC 73  TRIG 120   Lab Results  Component Value Date   HGBA1C 7.7 08/24/2022    Procedures since last visit: No results found.  Assessment/Plan 6.8 LDL 65 creat 1.26 1. Bilateral leg edema (Primary) Patient  refuses to be weighed and now has some edema in her legs D/w the caregivers to give her Lasix  1/2 times a week for edema without weighing  2. Type 2 diabetes mellitus with stage 3b chronic kidney disease, without long-term current use of insulin  (HCC) A1C 6.8 Continue on Diet   3. Chronic diastolic congestive heart failure (HCC) As above use Lasix  PRn  4. Hypercholesterolemia On statin Daughter will talk to brother who is Cardiologist  to discuss her risk and Benefit of staying on Statin  5. Stage 3a chronic kidney disease (HCC) Creat 1.26 which is stable  6. Paroxysmal atrial tachycardia (HCC) On High dose of Metoprolol  She is doing well on this and would not change her dose  7. Mild episode of recurrent major depressive disorder (HCC) Refused antidepressant Doing well overall  8. Essential hypertension Stable on Metoprolol   9. ILD (interstitial lung disease) (HCC) No further work up  10. Moderate dementia, unspecified dementia type, unspecified whether behavioral, psychotic, or mood disturbance or anxiety (HCC) Continue Aricept  and Namenda  to maintain in [place] level [place] care 11 Bronchitis Per CT Scan  Restart Trelegy as per Pulmonary   Labs/tests ordered:  Labs Before next visit Next appt:  Visit date not found

## 2023-01-18 NOTE — Patient Instructions (Addendum)
 Can take Lasix once or twice a week for LE edema Restarted Trelegy Also Restart Aricept Potassium 20 meq with Lasix

## 2023-01-19 DIAGNOSIS — R0902 Hypoxemia: Secondary | ICD-10-CM | POA: Diagnosis not present

## 2023-01-19 DIAGNOSIS — R410 Disorientation, unspecified: Secondary | ICD-10-CM | POA: Diagnosis not present

## 2023-01-19 DIAGNOSIS — I491 Atrial premature depolarization: Secondary | ICD-10-CM | POA: Diagnosis not present

## 2023-01-19 DIAGNOSIS — R739 Hyperglycemia, unspecified: Secondary | ICD-10-CM | POA: Diagnosis not present

## 2023-01-19 DIAGNOSIS — R000 Tachycardia, unspecified: Secondary | ICD-10-CM | POA: Diagnosis not present

## 2023-01-19 NOTE — Progress Notes (Signed)
 This encounter was created in error - please disregard.

## 2023-01-20 ENCOUNTER — Observation Stay (HOSPITAL_COMMUNITY): Payer: Medicare Other

## 2023-01-20 ENCOUNTER — Emergency Department (HOSPITAL_COMMUNITY): Payer: Medicare Other

## 2023-01-20 ENCOUNTER — Inpatient Hospital Stay (HOSPITAL_COMMUNITY)
Admission: EM | Admit: 2023-01-20 | Discharge: 2023-01-24 | DRG: 193 | Disposition: A | Discharge status: Skilled Nursing Facility | Payer: Medicare Other | Attending: Internal Medicine | Admitting: Internal Medicine

## 2023-01-20 ENCOUNTER — Other Ambulatory Visit: Payer: Self-pay

## 2023-01-20 ENCOUNTER — Encounter (HOSPITAL_COMMUNITY): Payer: Self-pay

## 2023-01-20 DIAGNOSIS — Z96641 Presence of right artificial hip joint: Secondary | ICD-10-CM | POA: Diagnosis present

## 2023-01-20 DIAGNOSIS — Z9841 Cataract extraction status, right eye: Secondary | ICD-10-CM | POA: Diagnosis not present

## 2023-01-20 DIAGNOSIS — Y95 Nosocomial condition: Secondary | ICD-10-CM | POA: Diagnosis present

## 2023-01-20 DIAGNOSIS — D696 Thrombocytopenia, unspecified: Secondary | ICD-10-CM | POA: Diagnosis present

## 2023-01-20 DIAGNOSIS — I2489 Other forms of acute ischemic heart disease: Secondary | ICD-10-CM | POA: Diagnosis present

## 2023-01-20 DIAGNOSIS — Z1152 Encounter for screening for COVID-19: Secondary | ICD-10-CM | POA: Diagnosis not present

## 2023-01-20 DIAGNOSIS — F0280 Dementia in other diseases classified elsewhere without behavioral disturbance: Secondary | ICD-10-CM | POA: Diagnosis present

## 2023-01-20 DIAGNOSIS — Z79899 Other long term (current) drug therapy: Secondary | ICD-10-CM

## 2023-01-20 DIAGNOSIS — R059 Cough, unspecified: Secondary | ICD-10-CM | POA: Diagnosis not present

## 2023-01-20 DIAGNOSIS — J439 Emphysema, unspecified: Secondary | ICD-10-CM | POA: Diagnosis not present

## 2023-01-20 DIAGNOSIS — E876 Hypokalemia: Secondary | ICD-10-CM | POA: Diagnosis present

## 2023-01-20 DIAGNOSIS — G4700 Insomnia, unspecified: Secondary | ICD-10-CM | POA: Diagnosis present

## 2023-01-20 DIAGNOSIS — Z881 Allergy status to other antibiotic agents status: Secondary | ICD-10-CM

## 2023-01-20 DIAGNOSIS — J189 Pneumonia, unspecified organism: Secondary | ICD-10-CM | POA: Diagnosis not present

## 2023-01-20 DIAGNOSIS — E119 Type 2 diabetes mellitus without complications: Secondary | ICD-10-CM | POA: Diagnosis present

## 2023-01-20 DIAGNOSIS — Z7401 Bed confinement status: Secondary | ICD-10-CM | POA: Diagnosis not present

## 2023-01-20 DIAGNOSIS — I5033 Acute on chronic diastolic (congestive) heart failure: Secondary | ICD-10-CM | POA: Diagnosis present

## 2023-01-20 DIAGNOSIS — J9601 Acute respiratory failure with hypoxia: Secondary | ICD-10-CM | POA: Diagnosis present

## 2023-01-20 DIAGNOSIS — I080 Rheumatic disorders of both mitral and aortic valves: Secondary | ICD-10-CM | POA: Diagnosis present

## 2023-01-20 DIAGNOSIS — J90 Pleural effusion, not elsewhere classified: Secondary | ICD-10-CM | POA: Diagnosis not present

## 2023-01-20 DIAGNOSIS — N3000 Acute cystitis without hematuria: Secondary | ICD-10-CM | POA: Diagnosis not present

## 2023-01-20 DIAGNOSIS — I10 Essential (primary) hypertension: Secondary | ICD-10-CM | POA: Diagnosis not present

## 2023-01-20 DIAGNOSIS — F17210 Nicotine dependence, cigarettes, uncomplicated: Secondary | ICD-10-CM | POA: Diagnosis present

## 2023-01-20 DIAGNOSIS — I110 Hypertensive heart disease with heart failure: Secondary | ICD-10-CM | POA: Diagnosis present

## 2023-01-20 DIAGNOSIS — M129 Arthropathy, unspecified: Secondary | ICD-10-CM | POA: Diagnosis not present

## 2023-01-20 DIAGNOSIS — Z961 Presence of intraocular lens: Secondary | ICD-10-CM | POA: Diagnosis present

## 2023-01-20 DIAGNOSIS — R531 Weakness: Secondary | ICD-10-CM | POA: Diagnosis not present

## 2023-01-20 DIAGNOSIS — Z8249 Family history of ischemic heart disease and other diseases of the circulatory system: Secondary | ICD-10-CM

## 2023-01-20 DIAGNOSIS — E7800 Pure hypercholesterolemia, unspecified: Secondary | ICD-10-CM | POA: Diagnosis present

## 2023-01-20 DIAGNOSIS — E039 Hypothyroidism, unspecified: Secondary | ICD-10-CM | POA: Diagnosis present

## 2023-01-20 DIAGNOSIS — R4182 Altered mental status, unspecified: Secondary | ICD-10-CM

## 2023-01-20 DIAGNOSIS — B9620 Unspecified Escherichia coli [E. coli] as the cause of diseases classified elsewhere: Secondary | ICD-10-CM | POA: Diagnosis present

## 2023-01-20 DIAGNOSIS — Z7951 Long term (current) use of inhaled steroids: Secondary | ICD-10-CM

## 2023-01-20 DIAGNOSIS — I472 Ventricular tachycardia, unspecified: Secondary | ICD-10-CM | POA: Diagnosis present

## 2023-01-20 DIAGNOSIS — Z9842 Cataract extraction status, left eye: Secondary | ICD-10-CM

## 2023-01-20 DIAGNOSIS — Z888 Allergy status to other drugs, medicaments and biological substances status: Secondary | ICD-10-CM

## 2023-01-20 DIAGNOSIS — I361 Nonrheumatic tricuspid (valve) insufficiency: Secondary | ICD-10-CM | POA: Diagnosis not present

## 2023-01-20 DIAGNOSIS — R0902 Hypoxemia: Secondary | ICD-10-CM | POA: Diagnosis not present

## 2023-01-20 DIAGNOSIS — I5031 Acute diastolic (congestive) heart failure: Secondary | ICD-10-CM | POA: Diagnosis not present

## 2023-01-20 DIAGNOSIS — Z7984 Long term (current) use of oral hypoglycemic drugs: Secondary | ICD-10-CM

## 2023-01-20 DIAGNOSIS — G309 Alzheimer's disease, unspecified: Secondary | ICD-10-CM | POA: Diagnosis present

## 2023-01-20 DIAGNOSIS — R0602 Shortness of breath: Secondary | ICD-10-CM | POA: Diagnosis not present

## 2023-01-20 LAB — COMPREHENSIVE METABOLIC PANEL
ALT: 15 U/L (ref 0–44)
AST: 19 U/L (ref 15–41)
Albumin: 3.4 g/dL — ABNORMAL LOW (ref 3.5–5.0)
Alkaline Phosphatase: 82 U/L (ref 38–126)
Anion gap: 12 (ref 5–15)
BUN: 27 mg/dL — ABNORMAL HIGH (ref 8–23)
CO2: 22 mmol/L (ref 22–32)
Calcium: 8.9 mg/dL (ref 8.9–10.3)
Chloride: 104 mmol/L (ref 98–111)
Creatinine, Ser: 1.3 mg/dL — ABNORMAL HIGH (ref 0.44–1.00)
GFR, Estimated: 38 mL/min — ABNORMAL LOW (ref >60)
Glucose, Bld: 148 mg/dL — ABNORMAL HIGH (ref 70–99)
Potassium: 3.7 mmol/L (ref 3.5–5.1)
Sodium: 138 mmol/L (ref 135–145)
Total Bilirubin: 1.5 mg/dL — ABNORMAL HIGH (ref 0.0–1.2)
Total Protein: 6.8 g/dL (ref 6.5–8.1)

## 2023-01-20 LAB — CBC WITH DIFFERENTIAL/PLATELET
Abs Immature Granulocytes: 0.07 10*3/uL (ref 0.00–0.07)
Basophils Absolute: 0 10*3/uL (ref 0.0–0.1)
Basophils Relative: 0 %
Eosinophils Absolute: 0 10*3/uL (ref 0.0–0.5)
Eosinophils Relative: 0 %
HCT: 48.7 % — ABNORMAL HIGH (ref 36.0–46.0)
Hemoglobin: 15.6 g/dL — ABNORMAL HIGH (ref 12.0–15.0)
Immature Granulocytes: 1 %
Lymphocytes Relative: 7 %
Lymphs Abs: 0.9 10*3/uL (ref 0.7–4.0)
MCH: 30.7 pg (ref 26.0–34.0)
MCHC: 32 g/dL (ref 30.0–36.0)
MCV: 95.9 fL (ref 80.0–100.0)
Monocytes Absolute: 1.4 10*3/uL — ABNORMAL HIGH (ref 0.1–1.0)
Monocytes Relative: 10 %
Neutro Abs: 11.2 10*3/uL — ABNORMAL HIGH (ref 1.7–7.7)
Neutrophils Relative %: 82 %
Platelets: 128 10*3/uL — ABNORMAL LOW (ref 150–400)
RBC: 5.08 MIL/uL (ref 3.87–5.11)
RDW: 14.6 % (ref 11.5–15.5)
WBC: 13.6 10*3/uL — ABNORMAL HIGH (ref 4.0–10.5)
nRBC: 0 % (ref 0.0–0.2)

## 2023-01-20 LAB — I-STAT CHEM 8, ED
BUN: 28 mg/dL — ABNORMAL HIGH (ref 8–23)
Calcium, Ion: 1.13 mmol/L — ABNORMAL LOW (ref 1.15–1.40)
Chloride: 107 mmol/L (ref 98–111)
Creatinine, Ser: 1.2 mg/dL — ABNORMAL HIGH (ref 0.44–1.00)
Glucose, Bld: 137 mg/dL — ABNORMAL HIGH (ref 70–99)
HCT: 44 % (ref 36.0–46.0)
Hemoglobin: 15 g/dL (ref 12.0–15.0)
Potassium: 3.9 mmol/L (ref 3.5–5.1)
Sodium: 142 mmol/L (ref 135–145)
TCO2: 24 mmol/L (ref 22–32)

## 2023-01-20 LAB — URINALYSIS, W/ REFLEX TO CULTURE (INFECTION SUSPECTED)
Bilirubin Urine: NEGATIVE
Glucose, UA: >=500 mg/dL — AB
Ketones, ur: NEGATIVE mg/dL
Nitrite: POSITIVE — AB
Protein, ur: 100 mg/dL — AB
Specific Gravity, Urine: 1.024 (ref 1.005–1.030)
WBC, UA: >50 WBC/hpf (ref 0–5)
pH: 5 (ref 5.0–8.0)

## 2023-01-20 LAB — I-STAT CG4 LACTIC ACID, ED
Lactic Acid, Venous: 2.4 mmol/L (ref 0.5–1.9)
Lactic Acid, Venous: 1.2 mmol/L (ref 0.5–1.9)

## 2023-01-20 LAB — TROPONIN I (HIGH SENSITIVITY)
Troponin I (High Sensitivity): 42 ng/L — ABNORMAL HIGH (ref <18)
Troponin I (High Sensitivity): 37 ng/L — ABNORMAL HIGH (ref <18)

## 2023-01-20 LAB — STREP PNEUMONIAE URINARY ANTIGEN: Strep Pneumo Urinary Antigen: NEGATIVE

## 2023-01-20 LAB — MAGNESIUM: Magnesium: 2.1 mg/dL (ref 1.7–2.4)

## 2023-01-20 LAB — RESP PANEL BY RT-PCR (RSV, FLU A&B, COVID)  RVPGX2
Influenza A by PCR: NEGATIVE
Influenza B by PCR: NEGATIVE
Resp Syncytial Virus by PCR: NEGATIVE
SARS Coronavirus 2 by RT PCR: NEGATIVE

## 2023-01-20 LAB — BRAIN NATRIURETIC PEPTIDE: B Natriuretic Peptide: 762.9 pg/mL — ABNORMAL HIGH (ref 0.0–100.0)

## 2023-01-20 MED ORDER — MEMANTINE HCL 10 MG PO TABS
10.0000 mg | ORAL_TABLET | Freq: Two times a day (BID) | ORAL | Status: DC
  Administered 2023-01-20 – 2023-01-24 (×9): 10 mg via ORAL
  Filled 2023-01-20 (×9): qty 1

## 2023-01-20 MED ORDER — ACETAMINOPHEN 650 MG RE SUPP
650.0000 mg | Freq: Four times a day (QID) | RECTAL | Status: DC | PRN
Start: 2023-01-20 — End: 2023-01-24

## 2023-01-20 MED ORDER — ACETAMINOPHEN 325 MG PO TABS: 650.0000 mg | ORAL_TABLET | Freq: Four times a day (QID) | ORAL | Status: DC | PRN

## 2023-01-20 MED ORDER — SODIUM CHLORIDE 0.9 % IV BOLUS
500.0000 mL | Freq: Once | INTRAVENOUS | Status: AC
  Administered 2023-01-20: 500 mL via INTRAVENOUS

## 2023-01-20 MED ORDER — HYDRALAZINE HCL 20 MG/ML IJ SOLN: 10.0000 mg | Freq: Four times a day (QID) | INTRAMUSCULAR | Status: DC | PRN

## 2023-01-20 MED ORDER — SODIUM CHLORIDE 0.9 % IV SOLN
500.0000 mg | INTRAVENOUS | Status: DC
  Administered 2023-01-20 – 2023-01-24 (×5): 500 mg via INTRAVENOUS
  Filled 2023-01-20 (×5): qty 5

## 2023-01-20 MED ORDER — SODIUM CHLORIDE 0.9 % IV SOLN
2.0000 g | Freq: Once | INTRAVENOUS | Status: AC
  Administered 2023-01-20: 2 g via INTRAVENOUS
  Filled 2023-01-20: qty 20

## 2023-01-20 MED ORDER — ENOXAPARIN SODIUM 30 MG/0.3ML IJ SOSY
30.0000 mg | PREFILLED_SYRINGE | Freq: Every day | INTRAMUSCULAR | Status: DC
  Administered 2023-01-20 – 2023-01-24 (×5): 30 mg via SUBCUTANEOUS
  Filled 2023-01-20 (×5): qty 0.3

## 2023-01-20 MED ORDER — DONEPEZIL HCL 10 MG PO TABS
10.0000 mg | ORAL_TABLET | Freq: Every day | ORAL | Status: DC
  Administered 2023-01-20 – 2023-01-23 (×4): 10 mg via ORAL
  Filled 2023-01-20 (×4): qty 1

## 2023-01-20 MED ORDER — POTASSIUM CHLORIDE 20 MEQ PO PACK
20.0000 meq | PACK | Freq: Once | ORAL | Status: AC
  Administered 2023-01-20: 20 meq via ORAL
  Filled 2023-01-20: qty 1

## 2023-01-20 MED ORDER — METOPROLOL TARTRATE 50 MG PO TABS
150.0000 mg | ORAL_TABLET | Freq: Two times a day (BID) | ORAL | Status: DC
  Administered 2023-01-20 – 2023-01-24 (×9): 150 mg via ORAL
  Filled 2023-01-20: qty 1
  Filled 2023-01-20: qty 6
  Filled 2023-01-20 (×7): qty 1

## 2023-01-20 MED ORDER — ARFORMOTEROL TARTRATE 15 MCG/2ML IN NEBU
15.0000 ug | INHALATION_SOLUTION | Freq: Two times a day (BID) | RESPIRATORY_TRACT | Status: DC
  Administered 2023-01-20 – 2023-01-24 (×7): 15 ug via RESPIRATORY_TRACT
  Filled 2023-01-20 (×7): qty 2

## 2023-01-20 MED ORDER — GUAIFENESIN ER 600 MG PO TB12
600.0000 mg | ORAL_TABLET | Freq: Two times a day (BID) | ORAL | Status: DC
  Administered 2023-01-20 – 2023-01-24 (×9): 600 mg via ORAL
  Filled 2023-01-20 (×8): qty 1

## 2023-01-20 MED ORDER — FUROSEMIDE 10 MG/ML IJ SOLN
40.0000 mg | Freq: Every day | INTRAMUSCULAR | Status: DC
  Administered 2023-01-20 – 2023-01-21 (×2): 40 mg via INTRAVENOUS
  Filled 2023-01-20 (×2): qty 4

## 2023-01-20 MED ORDER — ONDANSETRON HCL 4 MG/2ML IJ SOLN: 4.0000 mg | Freq: Four times a day (QID) | INTRAMUSCULAR | Status: DC | PRN

## 2023-01-20 MED ORDER — DOCUSATE SODIUM 100 MG PO CAPS
100.0000 mg | ORAL_CAPSULE | Freq: Two times a day (BID) | ORAL | Status: DC
  Administered 2023-01-20 – 2023-01-23 (×8): 100 mg via ORAL
  Filled 2023-01-20 (×9): qty 1

## 2023-01-20 MED ORDER — ONDANSETRON HCL 4 MG PO TABS: 4.0000 mg | ORAL_TABLET | Freq: Four times a day (QID) | ORAL | Status: DC | PRN

## 2023-01-20 MED ORDER — BISACODYL 5 MG PO TBEC
5.0000 mg | DELAYED_RELEASE_TABLET | Freq: Every day | ORAL | Status: DC | PRN
  Administered 2023-01-23: 5 mg via ORAL
  Filled 2023-01-20: qty 1

## 2023-01-20 MED ORDER — IPRATROPIUM-ALBUTEROL 0.5-2.5 (3) MG/3ML IN SOLN
3.0000 mL | Freq: Three times a day (TID) | RESPIRATORY_TRACT | Status: DC
  Administered 2023-01-20 – 2023-01-24 (×10): 3 mL via RESPIRATORY_TRACT
  Filled 2023-01-20 (×10): qty 3

## 2023-01-20 MED ORDER — ATORVASTATIN CALCIUM 40 MG PO TABS
40.0000 mg | ORAL_TABLET | Freq: Every day | ORAL | Status: DC
  Administered 2023-01-20 – 2023-01-24 (×5): 40 mg via ORAL
  Filled 2023-01-20 (×5): qty 1

## 2023-01-20 MED ORDER — SODIUM CHLORIDE 0.9 % IV SOLN
1.0000 g | INTRAVENOUS | Status: DC
  Filled 2023-01-20 (×2): qty 10

## 2023-01-20 MED ORDER — BUDESONIDE 0.25 MG/2ML IN SUSP
0.2500 mg | Freq: Two times a day (BID) | RESPIRATORY_TRACT | Status: DC
  Administered 2023-01-20 – 2023-01-24 (×8): 0.25 mg via RESPIRATORY_TRACT
  Filled 2023-01-20 (×8): qty 2

## 2023-01-20 NOTE — ED Triage Notes (Signed)
 PER EMS: pt is from [facility] assisted living; due to weakness and altered mental status. Staff at [facility] state she normally has bouts of confusion at times. At 2100 she was found altered, per staff. Pt is A&Ox3, no complaints, states she feels fine.  BP- 180/90, HR-80 with PVCs, 95% 4L [place]. Does not normally wear oxygen .

## 2023-01-20 NOTE — Plan of Care (Signed)
 Care plan reviewed.

## 2023-01-20 NOTE — TOC Initial Note (Signed)
 Transition of Care [facility]) - Initial/Assessment Note    Patient Details  Name: Kelsey Salinas MRN: [id number] Date of Birth: September 07, 1926  Transition [facility] Care [facility]) CM/SW Contact:    Waddell Barnie Rama, RN Phone Number: 01/20/2023, 4:06 PM  Clinical Narrative:                 From [facility] alone at [facility], has PCP and insurance on file, states has 24/7 care at [facility].  States [facility] will transport her home at [employer] and family is support system, states gets medications from [employer] at [place].  Pta self ambulatory with walker or uses w/chair.  Expected Discharge Plan: Home w Home Health Services Barriers to Discharge: Continued Medical Work up   Patient Goals and CMS Choice Patient states their goals for this hospitalization and ongoing recovery are:: return to [facility]   Choice offered to / list presented to : NA      Expected Discharge Plan and Services In-house Referral: NA Discharge Planning Services: CM Consult Post Acute Care Choice: NA Living arrangements for the past 2 months: Independent Living Facility                 DME Arranged: N/A DME Agency: NA       HH Arranged: NA          Prior Living Arrangements/Services Living arrangements for the past 2 months: Independent Living Facility Lives with:: Self Patient language and need for interpreter reviewed:: Yes Do you feel safe going back to the place where you live?: Yes      Need for Family Participation in Patient Care: Yes (Comment) Care giver support system in place?: Yes (comment) Current home services: DME (w/chair, walker) Criminal Activity/Legal Involvement Pertinent to Current Situation/Hospitalization: No - Comment as needed  Activities of Daily Living      Permission Sought/Granted Permission sought to share information with : Case Manager Permission granted to share information with : Yes, Verbal Permission Granted              Emotional  Assessment Appearance:: Appears stated age Attitude/Demeanor/Rapport: Engaged Affect (typically observed): Appropriate Orientation: : Oriented to Place, Oriented to  Time, Oriented to Situation, Oriented to Self Alcohol / Substance Use: Not Applicable Psych Involvement: No (comment)  Admission diagnosis:  Hypoxia [R09.02] Acute cystitis without hematuria [N30.00] Altered mental status, unspecified altered mental status type [R41.82] Patient Active Problem List   Diagnosis Date Noted   Hypoxia 01/20/2023   Paroxysmal atrial tachycardia (HCC)    Diastolic dysfunction    Acute cough    Acute on chronic heart failure with preserved ejection fraction (HFpEF) (HCC) 09/09/2021   Abscess of right leg 12/24/2020   Cellulitis 12/10/2020   Cellulitis of right leg 12/09/2020   Memory loss, short term 12/13/2018   DDD (degenerative disc disease), cervical 12/13/2018   B12 deficiency 12/13/2018   Chronic cough 07/25/2017   Abnormal MMSE 07/25/2017   Type II diabetes mellitus with stage 3 chronic kidney disease (HCC) 05/31/2017   Sinus tachycardia    Multiple fractures of ramus of right pubis with routine healing    Closed pelvic fracture (HCC) 05/18/2017   Bilateral chronic knee pain 01/18/2017   Bilateral primary osteoarthritis of knee 04/07/2016   Hyperglycemia 10/01/2015   Fracture of 5th metatarsal 02/14/2015   CKD (chronic kidney disease) stage 3, GFR 30-59 ml/min (HCC) 12/10/2013   Sebaceous cyst 12/10/2013   Insomnia    Colon polyp  Diverticular stricture (HCC)    Osteopenia    Vitamin D  deficiency 10/31/2013   Indigestion    Primary osteoarthritis of hip    Chronic diastolic heart failure (HCC) 12/25/2012   Rapid resting heart rate 12/25/2012   Essential hypertension 12/25/2012   Hypercholesterolemia    PCP:  Charlanne Fredia CROME, MD Pharmacy:   [facility] [place], [place] - [address] [place] [address] Phone:  [phone] Fax: [phone]     Social Drivers of Health (SDOH) Social History: SDOH Screenings   Food Insecurity: No Food Insecurity (07/19/2017)  Transportation Needs: No Transportation Needs (07/19/2017)  Alcohol Screen: Low Risk  (08/03/2017)  Depression (PHQ2-9): Low Risk  (08/31/2022)  Financial Resource Strain: Low Risk  (07/19/2017)  Physical Activity: Insufficiently Active (07/19/2017)  Social Connections: Socially Integrated (07/19/2017)  Stress: Stress Concern Present (07/19/2017)  Tobacco Use: Medium Risk (01/20/2023)   SDOH Interventions:     Readmission Risk Interventions     No data to display

## 2023-01-20 NOTE — ED Notes (Signed)
 [phone] Pt husband called for a update

## 2023-01-20 NOTE — ED Notes (Addendum)
 Pt having pvc's. This nurse retrieved EKG and Advised MD Bero.

## 2023-01-20 NOTE — Care Management Obs Status (Signed)
 MEDICARE OBSERVATION STATUS NOTIFICATION   Patient Details  Name: Kelsey Salinas MRN: [id number] Date of Birth: 01/20/1926   Medicare Observation Status Notification Given:  Yes    Leone Haven, RN 01/20/2023, 4:02 PM

## 2023-01-20 NOTE — ED Notes (Signed)
 Got patient hooked back up to the monitor patient has call bell in reach and care taker at bedside

## 2023-01-20 NOTE — H&P (Signed)
 History and Physical    Patient: Kelsey Salinas FMW:[id number] DOB: [birth year] DOA: 01/20/2023 DOS: the patient was seen and examined on 01/20/2023 PCP: Charlanne Fredia CROME, MD  Patient coming from: Home  Chief Complaint:  Chief Complaint  Patient presents with   Altered Mental Status   HPI: Kelsey Salinas is a 89 y.o. female with medical history significant of Alzheimer's dementia, diastolic heart failure, hyperlipidemia, arthritis, history of osteoporosis, allergic rhinitis, recent diagnosis of ILD refused treatment no further workup per notes, hypertension [facility] due to weakness, altered mental status, periods of  confusion.  Patient denies dysuria, chest pain, worsening shortness of breath.  She reports cough, productive at times.  Evaluation in the ED she was found to be hypoxic, oxygen  saturation 92 on 4 L.  She does not use oxygen  at home. Labs: NA 142, K 3.9, creatinine 1.2, white blood cell 13, hemoglobin 15, platelets 128.  Influenza AB RSV and COVID-negative.  UA more than 50 white blood cells positive nitrates.  Chest x-ray:Emphysema without evidence of acute chest disease. Stable COPD chest. Mild cardiomegaly without overt CHF.    Review of Systems: As mentioned in the history of present illness. All other systems reviewed and are negative. Past Medical History:  Diagnosis Date   Abscess of right leg 12/24/2020   Childhood asthma    Chronic diastolic heart failure (HCC) 12/25/2012   October 2013 echocardiogram 1. There is moderate concentric left ventricle hypertrophy. 2. Left ventricular ejection fraction estimated by 2D at 60-65 percent. 3. There were no regional wall motion abnormalities. 4. Mild mitral annular calcification. 5. Mild mitral valve regurgitation. 6. There is mild tricuspid regurgitation. 7. Mildly elevated estimated right ventricular systolic pressure. 8. Right ventricular systolic pressure estimated at 35-40 mm Hg. 9. Mild  calcification of the aortic valve. 10. Trace aortic valve regurgitation. 11. Analysis of mitral valve inflow, pulmonary vein Doppler and tissue Doppler suggests grade I diastolic dysfunction without elevated left atrial pressure.    Colon polyp    Diverticular stricture (HCC) 2009   Diverticulosis 10/30/2007   DM (diabetes mellitus), type 2 (HCC)    Edema 09/12/2013   Fall 05/18/2017   tripped over wheel of walker; superior and inferior pubic ramus fractures    Hypercholesterolemia    Hypertension    Hypertension, essential    Hypothyroidism    as a teenager   Indigestion    Insomnia    OA (osteoarthritis) of hip    right hip replacement, OA in bilateral knees   Osteopenia    Actonel stopped in 2012 after 5years therapy   Pneumonia 1939   hospitalized   Rapid resting heart rate 12/25/2012   Uncertain cause. Possible ectopic atrial tachycardia. Date entered 12/25/12    Right rotator cuff tear    Seasonal allergies    Past Surgical History:  Procedure Laterality Date   CATARACT EXTRACTION W/ INTRAOCULAR LENS  IMPLANT, BILATERAL Bilateral    COLONOSCOPY  2009   Dr. Newell   DILATION AND CURETTAGE OF UTERUS     FRACTURE SURGERY     JOINT REPLACEMENT     ORIF WRIST FRACTURE Right    REDUCTION MAMMAPLASTY  1999   TOTAL HIP ARTHROPLASTY Right 03-04-08   Dr. Melodi   Social History:  reports that she has quit smoking. Her smoking use included cigarettes. She has a 6 pack-year smoking history. She has never used smokeless tobacco. She reports current alcohol use. She reports that she does not use  drugs.  Allergies  Allergen Reactions   Amlodipine Besylate Other (See Comments)    Edema   Lisinopril Cough    Cough    Sulfamethoxazole-Trimethoprim Other (See Comments)    Unknown reaction     Family History  Problem Relation Age of Onset   Heart disease Mother    Heart disease Father    Heart disease Brother     Prior to Admission medications   Medication Sig Start  Date End Date Taking? Authorizing Provider  acetaminophen  (TYLENOL ) 500 MG tablet Take 500 mg by mouth daily.   Yes [provider]  atorvastatin  (LIPITOR) 40 MG tablet TAKE 1 TABLET ONCE DAILY. 02/26/22  Yes Gupta, Anjali L, MD  Cholecalciferol 1.25 MG (50000 UT) capsule Take 50,000 Units by mouth daily.   Yes [provider]  diclofenac  Sodium (VOLTAREN ) 1 % GEL Apply 2 g topically 4 (four) times daily as needed (arthritis pain).   Yes [provider]  donepezil  (ARICEPT ) 10 MG tablet TAKE ONE TABLET BY MOUTH AT BEDTIME 04/26/22  Yes Charlanne Fredia CROME, MD  empagliflozin  ([name] ) 10 MG TABS tablet Take 1 tablet (10 mg total) by mouth daily before breakfast. 10/18/22  Yes Charlanne Fredia CROME, MD  famotidine  (PEPCID ) 20 MG tablet Take 20 mg by mouth daily.   Yes [provider]  fexofenadine  (ALLEGRA ) 180 MG tablet Take 1 tablet (180 mg total) by mouth daily. 06/02/20  Yes Darlean Maus, NP  Fluticasone -Umeclidin-Vilant (TRELEGY ELLIPTA ) 200-62.5-25 MCG/ACT AEPB Inhale 1 puff into the lungs daily. 01/18/23  Yes Charlanne Fredia CROME, MD  furosemide  (LASIX ) 40 MG tablet Take 1 tablet (40 mg total) by mouth daily as needed for fluid (for weight gain, 3lbs in 1 day or 5lbs in 1 week). 09/11/21  Yes Fairy Frames, MD  losartan  (COZAAR ) 100 MG tablet Take 100 mg by mouth daily.   Yes [provider]  memantine  (NAMENDA ) 10 MG tablet TAKE 1 TABLET BY MOUTH TWICE DAILY. 11/16/22  Yes Charlanne Fredia CROME, MD  metoprolol  succinate (TOPROL -XL) 100 MG 24 hr tablet TAKE 1 AND 1/2 TABLETS BY MOUTH IN THE MORNING AND AT BEDTIME 07/19/22  Yes Lonni Slain, MD  polyethylene glycol (MIRALAX  / GLYCOLAX ) 17 g packet Take 17 g by mouth daily.   Yes [provider]  potassium chloride  SA (KLOR-CON  M) 20 MEQ tablet Take 1 tablet (20 mEq total) by mouth once as needed for up to 1 dose. Take it if takes Lasix  2/week 01/18/23  Yes Charlanne Fredia CROME, MD  sodium chloride  (OCEAN) 0.65 %  SOLN nasal spray Place 1 spray into both nostrils as needed for congestion.   Yes [provider]  triamcinolone  (NASACORT ) 55 MCG/ACT AERO nasal inhaler Place 2 sprays into the nose daily.   Yes [provider]  vitamin B-12 (CYANOCOBALAMIN ) 1000 MCG tablet Take 1 tablet (1,000 mcg total) by mouth daily. 12/16/17  Yes Cloria Annabella CROME, DO    Physical Exam: Vitals:   01/20/23 0700 01/20/23 0715 01/20/23 0730 01/20/23 0745  BP: (!) 176/87 (!) 149/78 (!) 167/82 (!) 160/74  Pulse: 79 66 73 69  Resp: 19  (!) 28 (!) 28  Temp:      TempSrc:      SpO2: 92% 95% 94% 93%   General: Alert conversant oriented x 3 answering question appropriately CVS: S1-S2 regular rhythm or rate Lungs: Normal respiratory effort, bilateral crackles Abdomen: Bowel sounds present, soft nontender nondistended Extremity +2 edema  Data Reviewed:  Labs reviewed.  Assessment and Plan: No notes have been filed under this hospital service. Service: Hospitalist  1-Acute hypoxic respiratory failure in the setting of Pneumonia and Acute  Diastolic heart failure exacerbation- Community Acquired PNA.  -Patient presented with hypoxia oxygen  saturation 92 on 4 L of oxygen , report  cough, leukocytosis white blood cell 13.  CT chest obtained showed posterior right upper lobe pneumonia.  Trace right and minimal left pleural effusion.  Emphysema.SABRA  BNP elevated 700 -Continue IV ceftriaxone  and Zithromax . -Check blood culture strep pneumonia antigen -Start 40 mg of IV Lasix  daily -Admit to telemetry -Start DuoNeb Pulmicort  and Brovana   2-Elevation Troponin, Abnormal EKG. Some st change V 4, 5, 6. Chest pain free.  Cardiology consultation.   3-Acute Diastolic Heart failure exacerbation;  Patient presents with hypoxia,Elevated BNP 762, mild elevation of troponin 42--3 7 Continue with IV Lasix  Plan to resume Cozaar  tomorrow if renal function continues to be stable  4-Hypertension: Resume Lasix ,  metoprolol  Resume Cozaar  tomorrow if renal function  remained stable  5-Dementia: Resume Aricept  and Namenda   6-history of pulmonary fibrosis on high-resolution CT. CT chest obtained for further evaluation, no significant progression of pulmonary fibrosis on this noncontrast CT If no improvement could consider prednisone    Advance Care Planning:   Code Status: Prior Full code, Son with discussed with other siblings  Consults: none  Family Communication: Son Over phone  Severity of Illness: The appropriate patient status for this patient is OBSERVATION. Observation status is judged to be reasonable and necessary in order to provide the required intensity of service to ensure the patient's safety. The patient's presenting symptoms, physical exam findings, and initial radiographic and laboratory data in the context of their medical condition is felt to place them at decreased risk for further clinical deterioration. Furthermore, it is anticipated that the patient will be medically stable for discharge from the hospital within 2 midnights of admission.   Author: Owen DELENA Lore, MD 01/20/2023 8:12 AM  For on call review [email or web address].

## 2023-01-20 NOTE — Consult Note (Signed)
 Cardiology Consultation   Patient ID: Kelsey Salinas MRN: [id number]; DOB: 16-Aug-1926  Admit date: 01/20/2023 Date of Consult: 01/20/2023  PCP:  Charlanne Fredia CROME, MD    [facility] Providers Cardiologist:  Shelda Bruckner, MD        Patient Profile:   Kelsey Salinas is a 89 y.o. female with a hx of Alzheimer's dementia, HFpEF, hyperlipidemia, ILD, and osteoporosis who is being seen 01/20/2023 for the evaluation of EKG changes at the request of Dr. Madelyne.  History of Present Illness:   [name] presented from [facility] with weakness and altered mental status.  She has denies chest pain or shortness of breath.  She did report an intermittently productive cough.  In the ED she was hypoxic to 92% on 4L.  Viral panel was negative.  CXR revealed emphysema without acute changes.  WBC 13.  CT chest revealed R posterior lobe pneumonia.  BNP 700.  She was started on antibiotics and given IV lasix .  HS troponin was mildly elevated to 42-->37 and EKG revealed mild lateral ST depressions so cardiology was consulted.  BP has been uncontrolled ranging 140s-170s systolic.  She reports a cough productive of white phlegm.  She denies CP and reports that her breathing is already feeling better.  She is accompanied by an aid is asking when she can go home.  She notes LE edema that has been intermittently present for the last couple of years.  She denies orthopnea or PND.    [name] was recently diagnosed with ILD and has refused treatment or further work up.     Past Medical History:  Diagnosis Date   Abscess of right leg 12/24/2020   Childhood asthma    Chronic diastolic heart failure (HCC) 12/25/2012   October 2013 echocardiogram 1. There is moderate concentric left ventricle hypertrophy. 2. Left ventricular ejection fraction estimated by 2D at 60-65 percent. 3. There were no regional wall motion abnormalities. 4. Mild mitral annular calcification. 5. Mild mitral valve  regurgitation. 6. There is mild tricuspid regurgitation. 7. Mildly elevated estimated right ventricular systolic pressure. 8. Right ventricular systolic pressure estimated at 35-40 mm Hg. 9. Mild calcification of the aortic valve. 10. Trace aortic valve regurgitation. 11. Analysis of mitral valve inflow, pulmonary vein Doppler and tissue Doppler suggests grade I diastolic dysfunction without elevated left atrial pressure.    Colon polyp    Diverticular stricture (HCC) 2009   Diverticulosis 10/30/2007   DM (diabetes mellitus), type 2 (HCC)    Edema 09/12/2013   Fall 05/18/2017   tripped over wheel of walker; superior and inferior pubic ramus fractures    Hypercholesterolemia    Hypertension    Hypertension, essential    Hypothyroidism    as a teenager   Indigestion    Insomnia    OA (osteoarthritis) of hip    right hip replacement, OA in bilateral knees   Osteopenia    Actonel stopped in 2012 after 5years therapy   Pneumonia 1939   hospitalized   Rapid resting heart rate 12/25/2012   Uncertain cause. Possible ectopic atrial tachycardia. Date entered 12/25/12    Right rotator cuff tear    Seasonal allergies     Past Surgical History:  Procedure Laterality Date   CATARACT EXTRACTION W/ INTRAOCULAR LENS  IMPLANT, BILATERAL Bilateral    COLONOSCOPY  2009   Dr. Newell   DILATION AND CURETTAGE OF UTERUS     FRACTURE SURGERY     JOINT REPLACEMENT  ORIF WRIST FRACTURE Right    REDUCTION MAMMAPLASTY  1999   TOTAL HIP ARTHROPLASTY Right 03-04-08   Dr. Melodi     Home Medications:  Prior to Admission medications   Medication Sig Start Date End Date Taking? Authorizing Provider  acetaminophen  (TYLENOL ) 500 MG tablet Take 500 mg by mouth daily.   Yes [provider]  atorvastatin  (LIPITOR) 40 MG tablet TAKE 1 TABLET ONCE DAILY. 02/26/22  Yes Gupta, Anjali L, MD  Cholecalciferol 1.25 MG (50000 UT) capsule Take 50,000 Units by mouth daily.   Yes [provider]   diclofenac  Sodium (VOLTAREN ) 1 % GEL Apply 2 g topically 4 (four) times daily as needed (arthritis pain).   Yes [provider]  donepezil  (ARICEPT ) 10 MG tablet TAKE ONE TABLET BY MOUTH AT BEDTIME 04/26/22  Yes Charlanne Fredia CROME, MD  empagliflozin  ([name] ) 10 MG TABS tablet Take 1 tablet (10 mg total) by mouth daily before breakfast. 10/18/22  Yes Charlanne Fredia CROME, MD  famotidine  (PEPCID ) 20 MG tablet Take 20 mg by mouth daily.   Yes [provider]  fexofenadine  (ALLEGRA ) 180 MG tablet Take 1 tablet (180 mg total) by mouth daily. 06/02/20  Yes Darlean Maus, NP  Fluticasone -Umeclidin-Vilant (TRELEGY ELLIPTA ) 200-62.5-25 MCG/ACT AEPB Inhale 1 puff into the lungs daily. 01/18/23  Yes Charlanne Fredia CROME, MD  furosemide  (LASIX ) 40 MG tablet Take 1 tablet (40 mg total) by mouth daily as needed for fluid (for weight gain, 3lbs in 1 day or 5lbs in 1 week). 09/11/21  Yes Fairy Frames, MD  losartan  (COZAAR ) 100 MG tablet Take 100 mg by mouth daily.   Yes [provider]  memantine  (NAMENDA ) 10 MG tablet TAKE 1 TABLET BY MOUTH TWICE DAILY. 11/16/22  Yes Charlanne Fredia CROME, MD  metoprolol  succinate (TOPROL -XL) 100 MG 24 hr tablet TAKE 1 AND 1/2 TABLETS BY MOUTH IN THE MORNING AND AT BEDTIME 07/19/22  Yes Lonni Slain, MD  polyethylene glycol (MIRALAX  / GLYCOLAX ) 17 g packet Take 17 g by mouth daily.   Yes [provider]  potassium chloride  SA (KLOR-CON  M) 20 MEQ tablet Take 1 tablet (20 mEq total) by mouth once as needed for up to 1 dose. Take it if takes Lasix  2/week 01/18/23  Yes Charlanne Fredia CROME, MD  sodium chloride  (OCEAN) 0.65 % SOLN nasal spray Place 1 spray into both nostrils as needed for congestion.   Yes [provider]  triamcinolone  (NASACORT ) 55 MCG/ACT AERO nasal inhaler Place 2 sprays into the nose daily.   Yes [provider]  vitamin B-12 (CYANOCOBALAMIN ) 1000 MCG tablet Take 1 tablet (1,000 mcg total) by mouth daily. 12/16/17  Yes Cloria Annabella CROME, DO    Inpatient Medications: Scheduled Meds:  arformoterol   15 mcg Nebulization BID   atorvastatin   40 mg Oral Daily   budesonide  (PULMICORT ) nebulizer solution  0.25 mg Nebulization BID   docusate sodium   100 mg Oral BID   donepezil   10 mg Oral QHS   enoxaparin  (LOVENOX ) injection  30 mg Subcutaneous Daily   furosemide   40 mg Intravenous Daily   guaiFENesin   600 mg Oral BID   ipratropium-albuterol   3 mL Nebulization TID   memantine   10 mg Oral BID   metoprolol  tartrate  150 mg Oral BID   Continuous Infusions:  azithromycin  Stopped (01/20/23 1336)   [START ON 01/21/2023] cefTRIAXone  (ROCEPHIN )  IV     PRN Meds: acetaminophen  **OR** acetaminophen , bisacodyl , hydrALAZINE , ondansetron  **OR** ondansetron  (ZOFRAN ) IV  Allergies:  Allergies  Allergen Reactions   Amlodipine Besylate Other (See Comments)    Edema   Lisinopril Cough    Cough    Sulfamethoxazole-Trimethoprim Other (See Comments)    Unknown reaction     Social History:   Social History   Socioeconomic History   Marital status: Married    Spouse name: Not on file   Number of children: Not on file   Years of education: Not on file   Highest education level: Not on file  Occupational History   Occupation: retired travel agent  Tobacco Use   Smoking status: Former    Current packs/day: 0.12    Average packs/day: 0.1 packs/day for 50.0 years (6.0 ttl pk-yrs)    Types: Cigarettes   Smokeless tobacco: Never   Tobacco comments:    stopped in the ate 1990s  Vaping Use   Vaping status: Never Used  Substance and Sexual Activity   Alcohol use: Yes    Comment: rarely   Drug use: Never   Sexual activity: Not Currently  Other Topics Concern   Not on file  Social History Narrative   Lives at [facility] since 2010   Husband Aida married 1952 (husband in memory care unit at [facility])   Former smoker 1/2 PPD, for 30 years, stopped 01/10/1989   Alcohol 2 glasses of wine at night   Exercise:  machines 3 times a week and daily walk   No POA/LW         Social Drivers of Corporate Investment Banker Strain: Low Risk  (07/19/2017)   Overall Financial Resource Strain (CARDIA)    Difficulty of Paying Living Expenses: Not hard at all  Food Insecurity: No Food Insecurity (01/20/2023)   Hunger Vital Sign    Worried About Running Out of Food in the Last Year: Never true    Ran Out of Food in the Last Year: Never true  Transportation Needs: No Transportation Needs (01/20/2023)   PRAPARE - Administrator, Civil Service (Medical): No    Lack of Transportation (Non-Medical): No  Physical Activity: Insufficiently Active (07/19/2017)   Exercise Vital Sign    Days of Exercise per Week: 7 days    Minutes of Exercise per Session: 20 min  Stress: Stress Concern Present (07/19/2017)   [employer] of Occupational Health - Occupational Stress Questionnaire    Feeling of Stress : To some extent  Social Connections: Moderately Integrated (01/20/2023)   Social Connection and Isolation Panel [NHANES]    Frequency of Communication with Friends and Family: Three times a week    Frequency of Social Gatherings with Friends and Family: More than three times a week    Attends Religious Services: More than 4 times per year    Active Member of [employer] or Organizations: No    Attends Banker Meetings: Never    Marital Status: Married  Catering Manager Violence: Not At Risk (01/20/2023)   Humiliation, Afraid, Rape, and Kick questionnaire    Fear of Current or Ex-Partner: No    Emotionally Abused: No    Physically Abused: No    Sexually Abused: No    Family History:    Family History  Problem Relation Age of Onset   Heart disease Mother    Heart disease Father    Heart disease Brother      ROS:  Please see the history of present illness.   All other ROS reviewed and negative.     Physical Exam/Data:  Vitals:   01/20/23 1017 01/20/23 1130 01/20/23 1426 01/20/23 1538  BP:  (!) 177/88  (!) 146/68   Pulse: 91  68   Resp: 17  20   Temp:   97.6 F (36.4 C) 98 F (36.7 C)  TempSrc:   Oral Oral  SpO2: 93%  94%   Weight:  80.7 kg    Height:  5' 8 (1.727 m)      Intake/Output Summary (Last 24 hours) at 01/20/2023 1709 Last data filed at 01/20/2023 1638 Gross per 24 hour  Intake 952.33 ml  Output --  Net 952.33 ml      01/20/2023   11:30 AM 08/31/2022   10:41 AM 05/14/2022    1:17 PM  Last 3 Weights  Weight (lbs) 177 lb 14.6 oz 178 lb 178 lb  Weight (kg) 80.7 kg 80.74 kg 80.74 kg     VS:  BP (!) 146/68 (BP Location: Left Arm)   Pulse 68   Temp 98 F (36.7 C) (Oral)   Resp 20   Ht 5' 8 (1.727 m)   Wt 80.7 kg   SpO2 94%   BMI 27.05 kg/m  , BMI Body mass index is 27.05 kg/m. GENERAL: Chronically ill appearing.  No acute distress. HEENT: Pupils equal round and reactive, fundi not visualized, oral mucosa unremarkable NECK:  No jugular venous distention, waveform within normal limits, carotid upstroke brisk and symmetric, no bruits, no thyromegaly LUNGS:  Diffuse rhonchi HEART:  RRR.  PMI not displaced or sustained,S1 and S2 within normal limits, no S3, no S4, no clicks, no rubs, no murmurs ABD:  Flat, positive bowel sounds normal in frequency in pitch, no bruits, no rebound, no guarding, no midline pulsatile mass, no hepatomegaly, no splenomegaly EXT:  2 plus pulses throughout, no edema, no cyanosis no clubbing SKIN:  No rashes no nodules NEURO:  Cranial nerves II through XII grossly intact, motor grossly intact throughout PSYCH:  Cognitively intact, oriented to person place and time   EKG:  The EKG was personally reviewed and demonstrates:  Sinus rhythm.  Rate 76 bpm.  R axis deviation.  Lateral ST depression.  Telemetry:  Telemetry was personally reviewed and demonstrates:  Sinus rhythm.  Frequent PACs.  SVT.  3 beats NSVT  Relevant CV Studies:  Echo 09/10/21: IMPRESSIONS    1. Left ventricular ejection fraction, by estimation, is 55 to 60%. The   left ventricle has normal function. The left ventricle has no regional  wall motion abnormalities. There is mild left ventricular hypertrophy.  Left ventricular diastolic parameters  are consistent with Grade I diastolic dysfunction (impaired relaxation).   2. Right ventricular systolic function is normal. The right ventricular  size is normal. There is mildly elevated pulmonary artery systolic  pressure.   3. The mitral valve is normal in structure. No evidence of mitral valve  regurgitation. No evidence of mitral stenosis.   4. The aortic valve is tricuspid. Aortic valve regurgitation is trivial.  No aortic stenosis is present.   5. The inferior vena cava is normal in size with greater than 50%  respiratory variability, suggesting right atrial pressure of 3 mmHg.   Laboratory Data:  High Sensitivity Troponin:   Recent Labs  Lab 01/20/23 0843 01/20/23 1044  TROPONINIHS 42* 37*     Chemistry Recent Labs  Lab 01/18/23 0000 01/20/23 0154 01/20/23 0526 01/20/23 0843  NA 141 138 142  --   K 4.1 3.7 3.9  --   CL 106 104 107  --  CO2 23* 22  --   --   GLUCOSE  --  148* 137*  --   BUN 26* 27* 28*  --   CREATININE 1.3* 1.30* 1.20*  --   CALCIUM  9.2 8.9  --   --   MG  --   --   --  2.1  GFRNONAA  --  38*  --   --   ANIONGAP  --  12  --   --     Recent Labs  Lab 01/18/23 0000 01/20/23 0154  PROT  --  6.8  ALBUMIN 3.8 3.4*  AST 22 19  ALT 16 15  ALKPHOS 108 82  BILITOT  --  1.5*   Lipids  Recent Labs  Lab 01/18/23 0000  CHOL 146  TRIG 126  HDL 56  LDLCALC 65    Hematology Recent Labs  Lab 01/18/23 0000 01/20/23 0154 01/20/23 0526  WBC 9.6 13.6*  --   RBC 5.15* 5.08  --   HGB 15.9 15.6* 15.0  HCT 49* 48.7* 44.0  MCV  --  95.9  --   MCH  --  30.7  --   MCHC  --  32.0  --   RDW  --  14.6  --   PLT 145* 128*  --    Thyroid   Recent Labs  Lab 01/18/23 0000  TSH 1.29    BNP Recent Labs  Lab 01/20/23 0843  BNP 762.9*    DDimer No results for  input(s): DDIMER in the last 168 hours.   Radiology/Studies:  CT CHEST WO CONTRAST Result Date: 01/20/2023 CLINICAL DATA:  Chronic shortness of breath. EXAM: CT CHEST WITHOUT CONTRAST TECHNIQUE: Multidetector CT imaging of the chest was performed following the standard protocol without IV contrast. RADIATION DOSE REDUCTION: This exam was performed according to the departmental dose-optimization program which includes automated exposure control, adjustment of the mA and/or kV according to patient size and/or use of iterative reconstruction technique. COMPARISON:  Chest x-ray from same day. High-resolution chest CT dated October 08, 2021. FINDINGS: Cardiovascular: Chronic cardiomegaly. Similar trace pericardial fluid. No thoracic aortic aneurysm. Coronary, aortic arch, and branch vessel atherosclerotic vascular disease. Mediastinum/Nodes: No pathologically enlarged mediastinal or axillary lymph nodes. Prominent right paratracheal lymph node is likely reactive. Mildly patulous esophagus. Thyroid  gland and trachea demonstrate no significant findings. Lungs/Pleura: Moderate emphysema. Trace right and minimal left pleural effusions. New consolidation in the posterior right upper lobe. Subsegmental atelectasis in the medial right lower lobe. No pneumothorax. Upper Abdomen: No acute abnormality. Musculoskeletal: No acute or significant osseous findings. IMPRESSION: 1. Posterior right upper lobe pneumonia. 2. Trace right and minimal left pleural effusions. 3. Aortic Atherosclerosis (ICD10-I70.0) and Emphysema (ICD10-J43.9). Electronically Signed   By: Elsie ONEIDA Shoulder M.D.   On: 01/20/2023 09:52   DG Chest 2 View Result Date: 01/20/2023 CLINICAL DATA:  10031.  Coughing, weakness and altered mentation. EXAM: CHEST - 2 VIEW COMPARISON:  HRCT 10/08/2021 FINDINGS: The lungs are emphysematous but clear with slightly elevated right hemidiaphragm. Mild cardiomegaly without overt CHF. Tortuous aorta with patchy  calcifications and stable mediastinum. No pleural effusion is seen. Osteopenia. There is chronic rotator cuff arthropathy with glenohumeral DJD, degenerative change of the spine. IMPRESSION: 1. Emphysema without evidence of acute chest disease. Stable COPD chest. 2. Mild cardiomegaly without overt CHF. 3. Aortic atherosclerosis and uncoiling. Electronically Signed   By: Francis Quam M.D.   On: 01/20/2023 01:35   Assessment and Plan:   # Acute on chronic HFpEF: #  Hypoxia: # HCAP: Patient presented from [facility] with pneumonia and volume overload.  Continue antibiotics per primary team.  Continue [name]  and gentle diuresis with lasix .  # Hypertension:  BP uncontrolled. Continue metoprolol .  Her home losartan  has been on hold.  Resume tomorrow if renal function is back to baseline.    # Demand ischemia: # EKG  changes:  # Hyperlipidemia: She denies chest pain.  Likely demand ischemia in the setting of underlying lung disease.  No ischemic evaluation planned.  Continue atorvastatin .   Will get an echo.    Risk Assessment/Risk Scores:        New York  Heart Association (NYHA) Functional Class NYHA Class II        For questions or updates, please contact [facility] Please consult [email or web address] for contact info under    Signed, Annabella Scarce, MD  01/20/2023 5:09 PM

## 2023-01-20 NOTE — Hospital Course (Addendum)
 89 year old female history of interstitial lung disease, Alzheimer's disease, diastolic heart failure, hyperlipidemia, osteoarthritis, osteoporosis and allergic rhinitis presented emergency department from assisted living facility due to complaining of weakness, altered mental status per staff report in the ED patient is alert orient x 3.  .  Per facility report patient has some confusion.  On presentation to ED patient found hypertensive blood pressure has been improved.  O2 sat 92% on 4 L otherwise hemodynamically stable. CMP showed elevated creatinine 1.3, elevated BUN 27, elevated bilirubin 1.2 otherwise unremarkable. CBC showing leukocytosis 13.6, stable H&H and platelet count 128 Elevated lactic acid 2.4. Respiratory negative for COVID, flu and RSV. UA showed evidence of UTI.  Pending urine culture. Chest x-ray emphysema with evidence of acute chest disease stable COPD.  Mild cardiomegaly without overt CHF.  Aortic atherosclerosis.  Patient has mild elevation of lactic acid even though blood pressure within good range.  ED physician will give some IV fluid bolus and patient has been started treating with ceftriaxone  for UTI.  Hospitalist has been contacted for further management of acute metabolic encephalopathy secondary to acute cystitis.

## 2023-01-20 NOTE — ED Notes (Signed)
 ED TO INPATIENT HANDOFF REPORT  ED Nurse Name and Phone #: Lorenza [phone]  S Name/Age/Gender Kelsey Salinas 89 y.o. female Room/Bed: 008C/008C  Code Status   Code Status: Full Code  Home/SNF/Other Skilled nursing facility Patient oriented to: self and place Is this baseline? Yes   Triage Complete: Triage complete  Chief Complaint Hypoxia [R09.02]  Triage Note PER EMS: pt is from [facility] assisted living; due to weakness and altered mental status. Staff at [facility] state she normally has bouts of confusion at times. At 2100 she was found altered, per staff. Pt is A&Ox3, no complaints, states she feels fine.  BP- 180/90, HR-80 with PVCs, 95% 4L [place]. Does not normally wear oxygen .    Allergies Allergies  Allergen Reactions   Amlodipine Besylate Other (See Comments)    Edema   Lisinopril Cough    Cough    Sulfamethoxazole-Trimethoprim Other (See Comments)    Unknown reaction     Level of Care/Admitting Diagnosis ED Disposition     ED Disposition  Admit   Condition  --   Comment  Hospital Area: [facility] [[id number]]  Level of Care: Telemetry Cardiac [103]  May place patient in observation at [facility] or [facility] if equivalent level of care is available:: No  Covid Evaluation: Asymptomatic - no recent exposure (last 10 days) testing not required  Diagnosis: Hypoxia [300808]  Admitting Physician: REGALADO, BELKYS A [phone]  Attending Physician: REGALADO, BELKYS A [phone]          B Medical/Surgery History Past Medical History:  Diagnosis Date   Abscess of right leg 12/24/2020   Childhood asthma    Chronic diastolic heart failure (HCC) 12/25/2012   October 2013 echocardiogram 1. There is moderate concentric left ventricle hypertrophy. 2. Left ventricular ejection fraction estimated by 2D at 60-65 percent. 3. There were no regional wall motion abnormalities. 4. Mild mitral annular calcification. 5. Mild mitral valve regurgitation.  6. There is mild tricuspid regurgitation. 7. Mildly elevated estimated right ventricular systolic pressure. 8. Right ventricular systolic pressure estimated at 35-40 mm Hg. 9. Mild calcification of the aortic valve. 10. Trace aortic valve regurgitation. 11. Analysis of mitral valve inflow, pulmonary vein Doppler and tissue Doppler suggests grade I diastolic dysfunction without elevated left atrial pressure.    Colon polyp    Diverticular stricture (HCC) 2009   Diverticulosis 10/30/2007   DM (diabetes mellitus), type 2 (HCC)    Edema 09/12/2013   Fall 05/18/2017   tripped over wheel of walker; superior and inferior pubic ramus fractures    Hypercholesterolemia    Hypertension    Hypertension, essential    Hypothyroidism    as a teenager   Indigestion    Insomnia    OA (osteoarthritis) of hip    right hip replacement, OA in bilateral knees   Osteopenia    Actonel stopped in 2012 after 5years therapy   Pneumonia 1939   hospitalized   Rapid resting heart rate 12/25/2012   Uncertain cause. Possible ectopic atrial tachycardia. Date entered 12/25/12    Right rotator cuff tear    Seasonal allergies    Past Surgical History:  Procedure Laterality Date   CATARACT EXTRACTION W/ INTRAOCULAR LENS  IMPLANT, BILATERAL Bilateral    COLONOSCOPY  2009   Dr. Newell   DILATION AND CURETTAGE OF UTERUS     FRACTURE SURGERY     JOINT REPLACEMENT     ORIF WRIST FRACTURE Right    REDUCTION MAMMAPLASTY  1999  TOTAL HIP ARTHROPLASTY Right 03-04-08   Dr. Melodi     A IV Location/Drains/Wounds Patient Lines/Drains/Airways Status     Active Line/Drains/Airways     Name Placement date Placement time Site Days   Peripheral IV 01/20/23 20 G Right Antecubital 01/20/23  0403  Antecubital  less than 1   External Urinary Catheter 01/20/23  1053  --  less than 1   Wound / Incision (Open or Dehisced) 12/09/20 Non-pressure wound Knee Anterior;Right 12/09/20  2200  Knee  772             Intake/Output Last 24 hours  Intake/Output Summary (Last 24 hours) at 01/20/2023 1334 Last data filed at 01/20/2023 9047 Gross per 24 hour  Intake 500 ml  Output --  Net 500 ml    Labs/Imaging Results for orders placed or performed during the hospital encounter of 01/20/23 (from the past 48 hours)  Comprehensive metabolic panel     Status: Abnormal   Collection Time: 01/20/23  1:54 AM  Result Value Ref Range   Sodium 138 135 - 145 mmol/L   Potassium 3.7 3.5 - 5.1 mmol/L   Chloride 104 98 - 111 mmol/L   CO2 22 22 - 32 mmol/L   Glucose, Bld 148 (H) 70 - 99 mg/dL    Comment: Glucose reference range applies only to samples taken after fasting for at least 8 hours.   BUN 27 (H) 8 - 23 mg/dL   Creatinine, Ser 8.69 (H) 0.44 - 1.00 mg/dL   Calcium  8.9 8.9 - 10.3 mg/dL   Total Protein 6.8 6.5 - 8.1 g/dL   Albumin 3.4 (L) 3.5 - 5.0 g/dL   AST 19 15 - 41 U/L   ALT 15 0 - 44 U/L   Alkaline Phosphatase 82 38 - 126 U/L   Total Bilirubin 1.5 (H) 0.0 - 1.2 mg/dL   GFR, Estimated 38 (L) >60 mL/min    Comment: (NOTE) Calculated using the CKD-EPI Creatinine Equation (2021)    Anion gap 12 5 - 15    Comment: Performed at [facility], [address], [place], [place] [address]  CBC with Differential     Status: Abnormal   Collection Time: 01/20/23  1:54 AM  Result Value Ref Range   WBC 13.6 (H) 4.0 - 10.5 K/uL   RBC 5.08 3.87 - 5.11 MIL/uL   Hemoglobin 15.6 (H) 12.0 - 15.0 g/dL   HCT 51.2 (H) 63.9 - 53.9 %   MCV 95.9 80.0 - 100.0 fL   MCH 30.7 26.0 - 34.0 pg   MCHC 32.0 30.0 - 36.0 g/dL   RDW 85.3 88.4 - 84.4 %   Platelets 128 (L) 150 - 400 K/uL    Comment: REPEATED TO VERIFY   nRBC 0.0 0.0 - 0.2 %   Neutrophils Relative % 82 %   Neutro Abs 11.2 (H) 1.7 - 7.7 K/uL   Lymphocytes Relative 7 %   Lymphs Abs 0.9 0.7 - 4.0 K/uL   Monocytes Relative 10 %   Monocytes Absolute 1.4 (H) 0.1 - 1.0 K/uL   Eosinophils Relative 0 %   Eosinophils Absolute 0.0 0.0 - 0.5 K/uL   Basophils  Relative 0 %   Basophils Absolute 0.0 0.0 - 0.1 K/uL   Immature Granulocytes 1 %   Abs Immature Granulocytes 0.07 0.00 - 0.07 K/uL    Comment: Performed at [facility], [address], [place], [place] [address]  I-Stat Lactic Acid, ED  Status: Abnormal   Collection Time: 01/20/23  2:51 AM  Result Value Ref Range   Lactic Acid, Venous 2.4 (HH) 0.5 - 1.9 mmol/L   Comment NOTIFIED PHYSICIAN   Resp panel by RT-PCR (RSV, Flu A&B, Covid) Anterior Nasal Swab     Status: None   Collection Time: 01/20/23  3:29 AM   Specimen: Anterior Nasal Swab  Result Value Ref Range   SARS Coronavirus 2 by RT PCR NEGATIVE NEGATIVE   Influenza A by PCR NEGATIVE NEGATIVE   Influenza B by PCR NEGATIVE NEGATIVE    Comment: (NOTE) The Xpert Xpress SARS-CoV-2/FLU/RSV plus assay is intended as an aid in the diagnosis of influenza from Nasopharyngeal swab specimens and should not be used as a sole basis for treatment. Nasal washings and aspirates are unacceptable for Xpert Xpress SARS-CoV-2/FLU/RSV testing.  Fact Sheet for Patients: bloggercourse.com  Fact Sheet for Healthcare Providers: seriousbroker.it  This test is not yet approved or cleared by the [place]  FDA and has been authorized for detection and/or diagnosis of SARS-CoV-2 by FDA under an Emergency Use Authorization (EUA). This EUA will remain in effect (meaning this test can be used) for the duration of the COVID-19 declaration under Section 564(b)(1) of the Act, 21 U.S.C. section 360bbb-3(b)(1), unless the authorization is terminated or revoked.     Resp Syncytial Virus by PCR NEGATIVE NEGATIVE    Comment: (NOTE) Fact Sheet for Patients: bloggercourse.com  Fact Sheet for Healthcare Providers: seriousbroker.it  This test is not yet approved or cleared by the [place]  FDA and has been authorized for detection and/or  diagnosis of SARS-CoV-2 by FDA under an Emergency Use Authorization (EUA). This EUA will remain in effect (meaning this test can be used) for the duration of the COVID-19 declaration under Section 564(b)(1) of the Act, 21 U.S.C. section 360bbb-3(b)(1), unless the authorization is terminated or revoked.  Performed at [facility], [address], [place], [place] [address]   Urinalysis, w/ Reflex to Culture (Infection Suspected) -Urine, Clean Catch     Status: Abnormal   Collection Time: 01/20/23  4:15 AM  Result Value Ref Range   Specimen Source URINE, CLEAN CATCH    Color, Urine YELLOW YELLOW   APPearance HAZY (A) CLEAR   Specific Gravity, Urine 1.024 1.005 - 1.030   pH 5.0 5.0 - 8.0   Glucose, UA >=500 (A) NEGATIVE mg/dL   Hgb urine dipstick SMALL (A) NEGATIVE   Bilirubin Urine NEGATIVE NEGATIVE   Ketones, ur NEGATIVE NEGATIVE mg/dL   Protein, ur 899 (A) NEGATIVE mg/dL   Nitrite POSITIVE (A) NEGATIVE   Leukocytes,Ua SMALL (A) NEGATIVE   RBC / HPF 0-5 0 - 5 RBC/hpf   WBC, UA >50 0 - 5 WBC/hpf    Comment:        Reflex urine culture not performed if WBC <=10, OR if Squamous epithelial cells >5. If Squamous epithelial cells >5 suggest recollection.    Bacteria, UA MANY (A) NONE SEEN   Squamous Epithelial / HPF 0-5 0 - 5 /HPF    Comment: Performed at [facility], [address] [place], [place] [address]  I-stat chem 8, ed     Status: Abnormal   Collection Time: 01/20/23  5:26 AM  Result Value Ref Range   Sodium 142 135 - 145 mmol/L   Potassium 3.9 3.5 - 5.1 mmol/L   Chloride 107 98 - 111 mmol/L   BUN 28 (H) 8 - 23 mg/dL   Creatinine, Ser 8.79 (  H) 0.44 - 1.00 mg/dL   Glucose, Bld 862 (H) 70 - 99 mg/dL    Comment: Glucose reference range applies only to samples taken after fasting for at least 8 hours.   Calcium , Ion 1.13 (L) 1.15 - 1.40 mmol/L   TCO2 24 22 - 32 mmol/L   Hemoglobin 15.0 12.0 - 15.0 g/dL   HCT 55.9 63.9 - 53.9 %  I-Stat Lactic Acid, ED      Status: None   Collection Time: 01/20/23  5:31 AM  Result Value Ref Range   Lactic Acid, Venous 1.2 0.5 - 1.9 mmol/L  Brain natriuretic peptide     Status: Abnormal   Collection Time: 01/20/23  8:43 AM  Result Value Ref Range   B Natriuretic Peptide 762.9 (H) 0.0 - 100.0 pg/mL    Comment: Performed at [facility], [address] [place], [place] [address]  Magnesium     Status: None   Collection Time: 01/20/23  8:43 AM  Result Value Ref Range   Magnesium 2.1 1.7 - 2.4 mg/dL    Comment: Performed at [facility], [address] [place], [place] [address]  Troponin I (High Sensitivity)     Status: Abnormal   Collection Time: 01/20/23  8:43 AM  Result Value Ref Range   Troponin I (High Sensitivity) 42 (H) <18 ng/L    Comment: (NOTE) Elevated high sensitivity troponin I (hsTnI) values and significant  changes across serial measurements may suggest ACS but many other  chronic and acute conditions are known to elevate hsTnI results.  Refer to the Links section for chest pain algorithms and additional  guidance. Performed at [facility], [address] [place], [place] [address]   Troponin I (High Sensitivity)     Status: Abnormal   Collection Time: 01/20/23 10:44 AM  Result Value Ref Range   Troponin I (High Sensitivity) 37 (H) <18 ng/L    Comment: (NOTE) Elevated high sensitivity troponin I (hsTnI) values and significant  changes across serial measurements may suggest ACS but many other  chronic and acute conditions are known to elevate hsTnI results.  Refer to the Links section for chest pain algorithms and additional  guidance. Performed at [facility], [address] [place], [place] [address]    CT CHEST WO CONTRAST Result Date: 01/20/2023 CLINICAL DATA:  Chronic shortness of breath. EXAM: CT CHEST WITHOUT CONTRAST TECHNIQUE: Multidetector CT imaging of the chest was performed following the standard protocol without IV contrast. RADIATION  DOSE REDUCTION: This exam was performed according to the departmental dose-optimization program which includes automated exposure control, adjustment of the mA and/or kV according to patient size and/or use of iterative reconstruction technique. COMPARISON:  Chest x-ray from same day. High-resolution chest CT dated October 08, 2021. FINDINGS: Cardiovascular: Chronic cardiomegaly. Similar trace pericardial fluid. No thoracic aortic aneurysm. Coronary, aortic arch, and branch vessel atherosclerotic vascular disease. Mediastinum/Nodes: No pathologically enlarged mediastinal or axillary lymph nodes. Prominent right paratracheal lymph node is likely reactive. Mildly patulous esophagus. Thyroid  gland and trachea demonstrate no significant findings. Lungs/Pleura: Moderate emphysema. Trace right and minimal left pleural effusions. New consolidation in the posterior right upper lobe. Subsegmental atelectasis in the medial right lower lobe. No pneumothorax. Upper Abdomen: No acute abnormality. Musculoskeletal: No acute or significant osseous findings. IMPRESSION: 1. Posterior right upper lobe pneumonia. 2. Trace right and minimal left pleural effusions. 3. Aortic Atherosclerosis (ICD10-I70.0) and Emphysema (ICD10-J43.9). Electronically Signed   By: Elsie ONEIDA Shoulder  M.D.   On: 01/20/2023 09:52   DG Chest 2 View Result Date: 01/20/2023 CLINICAL DATA:  100[address].  Coughing, weakness and altered mentation. EXAM: CHEST - 2 VIEW COMPARISON:  HRCT 10/08/2021 FINDINGS: The lungs are emphysematous but clear with slightly elevated right hemidiaphragm. Mild cardiomegaly without overt CHF. Tortuous aorta with patchy calcifications and stable mediastinum. No pleural effusion is seen. Osteopenia. There is chronic rotator cuff arthropathy with glenohumeral DJD, degenerative change of the spine. IMPRESSION: 1. Emphysema without evidence of acute chest disease. Stable COPD chest. 2. Mild cardiomegaly without overt CHF. 3. Aortic  atherosclerosis and uncoiling. Electronically Signed   By: Francis Quam M.D.   On: 01/20/2023 01:35    Pending Labs Unresulted Labs (From admission, onward)     Start     Ordered   01/21/23 0500  Basic metabolic panel  Tomorrow morning,   R        01/20/23 0837   01/21/23 0500  CBC  Tomorrow morning,   R        01/20/23 0837   01/20/23 0415  Urine Culture  Once,   R        01/20/23 0415            Vitals/Pain Today's Vitals   01/20/23 0745 01/20/23 1016 01/20/23 1017 01/20/23 1130  BP: (!) 160/74  (!) 177/88   Pulse: 69  91   Resp: (!) 28  17   Temp:  97.7 F (36.5 C)    TempSrc:      SpO2: 93%  93%   Weight:    80.7 kg  Height:    5' 8 (1.727 m)  PainSc:        Isolation Precautions No active isolations  Medications Medications  hydrALAZINE  (APRESOLINE ) injection 10 mg (has no administration in time range)  cefTRIAXone  (ROCEPHIN ) 1 g in sodium chloride  0.9 % 100 mL IVPB (has no administration in time range)  ipratropium-albuterol  (DUONEB) 0.5-2.5 (3) MG/3ML nebulizer solution 3 mL (3 mLs Nebulization Given 01/20/23 1037)  guaiFENesin  (MUCINEX ) 12 hr tablet 600 mg (600 mg Oral Given 01/20/23 1024)  budesonide  (PULMICORT ) nebulizer solution 0.25 mg (0.25 mg Nebulization Given 01/20/23 0840)  atorvastatin  (LIPITOR) tablet 40 mg (40 mg Oral Given 01/20/23 1024)  donepezil  (ARICEPT ) tablet 10 mg (has no administration in time range)  memantine  (NAMENDA ) tablet 10 mg (10 mg Oral Given 01/20/23 1025)  enoxaparin  (LOVENOX ) injection 30 mg (30 mg Subcutaneous Given 01/20/23 1026)  acetaminophen  (TYLENOL ) tablet 650 mg (has no administration in time range)    Or  acetaminophen  (TYLENOL ) suppository 650 mg (has no administration in time range)  ondansetron  (ZOFRAN ) tablet 4 mg (has no administration in time range)    Or  ondansetron  (ZOFRAN ) injection 4 mg (has no administration in time range)  bisacodyl  (DULCOLAX) EC tablet 5 mg (has no administration in time range)  docusate  sodium (COLACE) capsule 100 mg (100 mg Oral Given 01/20/23 1025)  furosemide  (LASIX ) injection 40 mg (40 mg Intravenous Given 01/20/23 0953)  metoprolol  tartrate (LOPRESSOR ) tablet 150 mg (150 mg Oral Given 01/20/23 1019)  azithromycin  (ZITHROMAX ) 500 mg in sodium chloride  0.9 % 250 mL IVPB (500 mg Intravenous New Bag/Given 01/20/23 1127)  cefTRIAXone  (ROCEPHIN ) 2 g in sodium chloride  0.9 % 100 mL IVPB (0 g Intravenous Stopped 01/20/23 0717)  sodium chloride  0.9 % bolus 500 mL (0 mLs Intravenous Stopped 01/20/23 9047)    Mobility walks with person assist     Focused Assessments Cardiac Assessment  Handoff:    Lab Results  Component Value Date   CKTOTAL 35 (L) 12/09/2020   Lab Results  Component Value Date   DDIMER 1.45 (H) 09/09/2021   Does the Patient currently have chest pain? No    R Recommendations: See Admitting Provider Note  Report given to:   Additional Notes:

## 2023-01-20 NOTE — ED Provider Notes (Signed)
 [facility]-EMERGENCY DEPT [facility] Emergency Department Provider Note MRN:  [id number]  Arrival date & time: 01/20/23     Chief Complaint   Altered Mental Status   History of Present Illness   Kelsey Salinas is a 89 y.o. year-old female with a history of CHF, hypertension presenting to the ED with chief complaint of altered mental status.  Report of generalized weakness and periods of confusion at care facility.  Patient is without complaints at this time, does not know why she is here.  Review of Systems  I was unable to obtain a full/accurate HPI, PMH, or ROS due to the patient's dementia.  Patient's Health History    Past Medical History:  Diagnosis Date   Abscess of right leg 12/24/2020   Childhood asthma    Chronic diastolic heart failure (HCC) 12/25/2012   October 2013 echocardiogram 1. There is moderate concentric left ventricle hypertrophy. 2. Left ventricular ejection fraction estimated by 2D at 60-65 percent. 3. There were no regional wall motion abnormalities. 4. Mild mitral annular calcification. 5. Mild mitral valve regurgitation. 6. There is mild tricuspid regurgitation. 7. Mildly elevated estimated right ventricular systolic pressure. 8. Right ventricular systolic pressure estimated at 35-40 mm Hg. 9. Mild calcification of the aortic valve. 10. Trace aortic valve regurgitation. 11. Analysis of mitral valve inflow, pulmonary vein Doppler and tissue Doppler suggests grade I diastolic dysfunction without elevated left atrial pressure.    Colon polyp    Diverticular stricture (HCC) 2009   Diverticulosis 10/30/2007   DM (diabetes mellitus), type 2 (HCC)    Edema 09/12/2013   Fall 05/18/2017   tripped over wheel of walker; superior and inferior pubic ramus fractures    Hypercholesterolemia    Hypertension    Hypertension, essential    Hypothyroidism    as a teenager   Indigestion    Insomnia    OA (osteoarthritis) of hip    right hip replacement, OA in bilateral  knees   Osteopenia    Actonel stopped in 2012 after 5years therapy   Pneumonia 1939   hospitalized   Rapid resting heart rate 12/25/2012   Uncertain cause. Possible ectopic atrial tachycardia. Date entered 12/25/12    Right rotator cuff tear    Seasonal allergies     Past Surgical History:  Procedure Laterality Date   CATARACT EXTRACTION W/ INTRAOCULAR LENS  IMPLANT, BILATERAL Bilateral    COLONOSCOPY  2009   Dr. Newell   DILATION AND CURETTAGE OF UTERUS     FRACTURE SURGERY     JOINT REPLACEMENT     ORIF WRIST FRACTURE Right    REDUCTION MAMMAPLASTY  1999   TOTAL HIP ARTHROPLASTY Right 03-04-08   Dr. Melodi    Family History  Problem Relation Age of Onset   Heart disease Mother    Heart disease Father    Heart disease Brother     Social History   Socioeconomic History   Marital status: Married    Spouse name: Not on file   Number of children: Not on file   Years of education: Not on file   Highest education level: Not on file  Occupational History   Occupation: retired travel agent  Tobacco Use   Smoking status: Former    Current packs/day: 0.12    Average packs/day: 0.1 packs/day for 50.0 years (6.0 ttl pk-yrs)    Types: Cigarettes   Smokeless tobacco: Never   Tobacco comments:    stopped in the ate 1990s  Vaping Use  Vaping status: Never Used  Substance and Sexual Activity   Alcohol use: Yes    Comment: rarely   Drug use: Never   Sexual activity: Not Currently  Other Topics Concern   Not on file  Social History Narrative   Lives at [place] since 2010   Husband Aida married 1952 (husband in memory care unit at [facility])   Former smoker 1/2 PPD, for 30 years, stopped 01/10/1989   Alcohol 2 glasses of wine at night   Exercise: machines 3 times a week and daily walk   No POA/LW         Social Drivers of Corporate Investment Banker Strain: Low Risk  (07/19/2017)   Overall Financial Resource Strain (CARDIA)    Difficulty of Paying Living  Expenses: Not hard at all  Food Insecurity: No Food Insecurity (07/19/2017)   Hunger Vital Sign    Worried About Running Out of Food in the Last Year: Never true    Ran Out of Food in the Last Year: Never true  Transportation Needs: No Transportation Needs (07/19/2017)   PRAPARE - Administrator, Civil Service (Medical): No    Lack of Transportation (Non-Medical): No  Physical Activity: Insufficiently Active (07/19/2017)   Exercise Vital Sign    Days of Exercise per Week: 7 days    Minutes of Exercise per Session: 20 min  Stress: Stress Concern Present (07/19/2017)   [employer] of Occupational Health - Occupational Stress Questionnaire    Feeling of Stress : To some extent  Social Connections: Socially Integrated (07/19/2017)   Social Connection and Isolation Panel [NHANES]    Frequency of Communication with Friends and Family: More than three times a week    Frequency of Social Gatherings with Friends and Family: More than three times a week    Attends Religious Services: More than 4 times per year    Active Member of [employer] or Organizations: Yes    Attends Banker Meetings: Never    Marital Status: Married  Catering Manager Violence: Not At Risk (07/19/2017)   Humiliation, Afraid, Rape, and Kick questionnaire    Fear of Current or Ex-Partner: No    Emotionally Abused: No    Physically Abused: No    Sexually Abused: No     Physical Exam   Vitals:   01/20/23 0027 01/20/23 0315  BP: (!) 161/92 (!) 151/73  Pulse: 78 72  Resp: (!) 22 (!) 25  Temp: 97.7 F (36.5 C) 97.7 F (36.5 C)  SpO2: 92% 94%    CONSTITUTIONAL: Chronically ill-appearing, NAD NEURO/PSYCH:  Alert, oriented to name, moves all extremities equally EYES:  eyes equal and reactive ENT/NECK:  no LAD, no JVD CARDIO: Regular rate, well-perfused, normal S1 and S2 PULM:  CTAB no wheezing or rhonchi GI/GU:  non-distended, non-tender MSK/SPINE:  No gross deformities, no edema SKIN:  no rash,  atraumatic   *Additional and/or pertinent findings included in MDM below  Diagnostic and Interventional Summary    EKG Interpretation Date/Time:  January 20, 2023 at 02:39:06 Ventricular Rate:   75 PR Interval:   164 QRS Duration:   104 QT Interval:   392 QTC Calculation:  438 R Axis:      Text Interpretation: Sinus rhythm, frequent PVC       Labs Reviewed  COMPREHENSIVE METABOLIC PANEL - Abnormal; Notable for the following components:      Result Value   Glucose, Bld 148 (*)    BUN 27 (*)  Creatinine, Ser 1.30 (*)    Albumin 3.4 (*)    Total Bilirubin 1.5 (*)    GFR, Estimated 38 (*)    All other components within normal limits  CBC WITH DIFFERENTIAL/PLATELET - Abnormal; Notable for the following components:   WBC 13.6 (*)    Hemoglobin 15.6 (*)    HCT 48.7 (*)    Platelets 128 (*)    Neutro Abs 11.2 (*)    Monocytes Absolute 1.4 (*)    All other components within normal limits  URINALYSIS, W/ REFLEX TO CULTURE (INFECTION SUSPECTED) - Abnormal; Notable for the following components:   APPearance HAZY (*)    Glucose, UA >=500 (*)    Hgb urine dipstick SMALL (*)    Protein, ur 100 (*)    Nitrite POSITIVE (*)    Leukocytes,Ua SMALL (*)    Bacteria, UA MANY (*)    All other components within normal limits  I-STAT CG4 LACTIC ACID, ED - Abnormal; Notable for the following components:   Lactic Acid, Venous 2.4 (*)    All other components within normal limits  RESP PANEL BY RT-PCR (RSV, FLU A&B, COVID)  [id number]  URINE CULTURE  I-STAT CG4 LACTIC ACID, ED    DG Chest 2 View  Final Result      Medications  cefTRIAXone  (ROCEPHIN ) 2 g in sodium chloride  0.9 % 100 mL IVPB (2 g Intravenous New Bag/Given 01/20/23 0518)  sodium chloride  0.9 % bolus 500 mL (has no administration in time range)     Procedures  /  Critical Care Procedures  ED Course and Medical Decision Making  Initial Impression and Ddx Episodes of confusion, weakness, differential diagnosis includes  electrolyte disturbance, UTI, viral illness.  No reported falls, no trauma, patient is with a reassuring neurological exam and seems fairly alert and oriented on our exam here, doubt CNS process, no real indication for advanced imaging at this time.  Past medical/surgical history that increases complexity of ED encounter: Dementia  Interpretation of Diagnostics I personally reviewed the EKG and my interpretation is as follows: Sinus rhythm with frequent PVC  Labs reveal minimally elevated lactate, urinalysis suspicious for infection, leukocytosis  Patient Reassessment and Ultimate Disposition/Management     Admitted to medicine for further care.  Patient management required discussion with the following services or consulting groups:  Hospitalist Service  Complexity of Problems Addressed Acute illness or injury that poses threat of life of bodily function  Additional Data Reviewed and Analyzed Further history obtained from: None  Additional Factors Impacting ED Encounter Risk Consideration of hospitalization  Ozell HERO. Theadore, MD [facility] Emergency Medicine [facility] [email or web address]  Final Clinical Impressions(s) / ED Diagnoses     ICD-10-CM   1. Acute cystitis without hematuria  N30.00     2. Altered mental status, unspecified altered mental status type  R41.82       ED Discharge Orders     None        Discharge Instructions Discussed with and Provided to Patient:   Discharge Instructions   None      Theadore Ozell HERO, MD 01/20/23 [id number]

## 2023-01-21 ENCOUNTER — Observation Stay (HOSPITAL_COMMUNITY): Payer: Medicare Other

## 2023-01-21 DIAGNOSIS — R0902 Hypoxemia: Secondary | ICD-10-CM | POA: Diagnosis present

## 2023-01-21 DIAGNOSIS — J189 Pneumonia, unspecified organism: Secondary | ICD-10-CM | POA: Diagnosis present

## 2023-01-21 DIAGNOSIS — J9601 Acute respiratory failure with hypoxia: Secondary | ICD-10-CM | POA: Diagnosis present

## 2023-01-21 DIAGNOSIS — N3000 Acute cystitis without hematuria: Secondary | ICD-10-CM | POA: Diagnosis present

## 2023-01-21 DIAGNOSIS — E876 Hypokalemia: Secondary | ICD-10-CM | POA: Diagnosis present

## 2023-01-21 DIAGNOSIS — J439 Emphysema, unspecified: Secondary | ICD-10-CM | POA: Diagnosis present

## 2023-01-21 DIAGNOSIS — I361 Nonrheumatic tricuspid (valve) insufficiency: Secondary | ICD-10-CM

## 2023-01-21 DIAGNOSIS — F17210 Nicotine dependence, cigarettes, uncomplicated: Secondary | ICD-10-CM | POA: Diagnosis present

## 2023-01-21 DIAGNOSIS — E119 Type 2 diabetes mellitus without complications: Secondary | ICD-10-CM | POA: Diagnosis present

## 2023-01-21 DIAGNOSIS — Z961 Presence of intraocular lens: Secondary | ICD-10-CM | POA: Diagnosis present

## 2023-01-21 DIAGNOSIS — Z7401 Bed confinement status: Secondary | ICD-10-CM | POA: Diagnosis not present

## 2023-01-21 DIAGNOSIS — E7800 Pure hypercholesterolemia, unspecified: Secondary | ICD-10-CM | POA: Diagnosis present

## 2023-01-21 DIAGNOSIS — Z9842 Cataract extraction status, left eye: Secondary | ICD-10-CM | POA: Diagnosis not present

## 2023-01-21 DIAGNOSIS — Z9841 Cataract extraction status, right eye: Secondary | ICD-10-CM | POA: Diagnosis not present

## 2023-01-21 DIAGNOSIS — I080 Rheumatic disorders of both mitral and aortic valves: Secondary | ICD-10-CM | POA: Diagnosis present

## 2023-01-21 DIAGNOSIS — G309 Alzheimer's disease, unspecified: Secondary | ICD-10-CM | POA: Diagnosis present

## 2023-01-21 DIAGNOSIS — D696 Thrombocytopenia, unspecified: Secondary | ICD-10-CM | POA: Diagnosis present

## 2023-01-21 DIAGNOSIS — I2489 Other forms of acute ischemic heart disease: Secondary | ICD-10-CM | POA: Diagnosis present

## 2023-01-21 DIAGNOSIS — Z1152 Encounter for screening for COVID-19: Secondary | ICD-10-CM | POA: Diagnosis not present

## 2023-01-21 DIAGNOSIS — Z8249 Family history of ischemic heart disease and other diseases of the circulatory system: Secondary | ICD-10-CM | POA: Diagnosis not present

## 2023-01-21 DIAGNOSIS — Y95 Nosocomial condition: Secondary | ICD-10-CM | POA: Diagnosis present

## 2023-01-21 DIAGNOSIS — Z96641 Presence of right artificial hip joint: Secondary | ICD-10-CM | POA: Diagnosis present

## 2023-01-21 DIAGNOSIS — I5033 Acute on chronic diastolic (congestive) heart failure: Secondary | ICD-10-CM | POA: Diagnosis present

## 2023-01-21 DIAGNOSIS — I5031 Acute diastolic (congestive) heart failure: Secondary | ICD-10-CM

## 2023-01-21 DIAGNOSIS — Z881 Allergy status to other antibiotic agents status: Secondary | ICD-10-CM | POA: Diagnosis not present

## 2023-01-21 DIAGNOSIS — F0280 Dementia in other diseases classified elsewhere without behavioral disturbance: Secondary | ICD-10-CM | POA: Diagnosis present

## 2023-01-21 DIAGNOSIS — I110 Hypertensive heart disease with heart failure: Secondary | ICD-10-CM | POA: Diagnosis present

## 2023-01-21 DIAGNOSIS — I472 Ventricular tachycardia, unspecified: Secondary | ICD-10-CM | POA: Diagnosis present

## 2023-01-21 DIAGNOSIS — E039 Hypothyroidism, unspecified: Secondary | ICD-10-CM | POA: Diagnosis present

## 2023-01-21 LAB — ECHOCARDIOGRAM COMPLETE
AR max vel: 2.61 cm2
AV Area VTI: 2.6 cm2
AV Area mean vel: 2.75 cm2
AV Mean grad: 3 mm[Hg]
AV Peak grad: 6.1 mm[Hg]
Ao pk vel: 1.23 m/s
Area-P 1/2: 4.19 cm2
Height: 68 in
P 1/2 time: 449 ms
S' Lateral: 2.6 cm
Weight: 2550.28 [oz_av]

## 2023-01-21 LAB — BASIC METABOLIC PANEL
Anion gap: 11 (ref 5–15)
BUN: 27 mg/dL — ABNORMAL HIGH (ref 8–23)
CO2: 22 mmol/L (ref 22–32)
Calcium: 8.5 mg/dL — ABNORMAL LOW (ref 8.9–10.3)
Chloride: 106 mmol/L (ref 98–111)
Creatinine, Ser: 1.26 mg/dL — ABNORMAL HIGH (ref 0.44–1.00)
GFR, Estimated: 40 mL/min — ABNORMAL LOW (ref >60)
Glucose, Bld: 140 mg/dL — ABNORMAL HIGH (ref 70–99)
Potassium: 3.8 mmol/L (ref 3.5–5.1)
Sodium: 139 mmol/L (ref 135–145)

## 2023-01-21 LAB — CBC
HCT: 45.2 % (ref 36.0–46.0)
Hemoglobin: 14.3 g/dL (ref 12.0–15.0)
MCH: 30 pg (ref 26.0–34.0)
MCHC: 31.6 g/dL (ref 30.0–36.0)
MCV: 95 fL (ref 80.0–100.0)
Platelets: 125 10*3/uL — ABNORMAL LOW (ref 150–400)
RBC: 4.76 MIL/uL (ref 3.87–5.11)
RDW: 14.6 % (ref 11.5–15.5)
WBC: 11.7 10*3/uL — ABNORMAL HIGH (ref 4.0–10.5)
nRBC: 0 % (ref 0.0–0.2)

## 2023-01-21 MED ORDER — FUROSEMIDE 40 MG PO TABS
40.0000 mg | ORAL_TABLET | ORAL | Status: DC
  Administered 2023-01-24: 40 mg via ORAL
  Filled 2023-01-21: qty 1

## 2023-01-21 MED ORDER — EMPAGLIFLOZIN 10 MG PO TABS: 10.0000 mg | ORAL_TABLET | Freq: Every day | ORAL | Status: DC

## 2023-01-21 MED ORDER — LOSARTAN POTASSIUM 50 MG PO TABS
100.0000 mg | ORAL_TABLET | Freq: Every day | ORAL | Status: DC
  Administered 2023-01-21 – 2023-01-24 (×4): 100 mg via ORAL
  Filled 2023-01-21 (×4): qty 2

## 2023-01-21 MED ORDER — SODIUM CHLORIDE 0.9 % IV SOLN
2.0000 g | INTRAVENOUS | Status: DC
  Administered 2023-01-22 – 2023-01-24 (×3): 2 g via INTRAVENOUS
  Filled 2023-01-21 (×3): qty 20

## 2023-01-21 NOTE — Plan of Care (Signed)
  Problem: Education: Goal: Knowledge of General Education information will improve Description: Including pain rating scale, medication(s)/side effects and non-pharmacologic comfort measures 01/21/2023 1656 by Johnnye Elpidio NOVAK, RN Outcome: Progressing 01/21/2023 1656 by Johnnye Elpidio NOVAK, RN Outcome: Progressing   Problem: Health Behavior/Discharge Planning: Goal: Ability to manage health-related needs will improve 01/21/2023 1656 by Johnnye Elpidio NOVAK, RN Outcome: Progressing 01/21/2023 1656 by Johnnye Elpidio NOVAK, RN Outcome: Progressing   Problem: Clinical Measurements: Goal: Ability to maintain clinical measurements within normal limits will improve 01/21/2023 1656 by Johnnye Elpidio NOVAK, RN Outcome: Progressing 01/21/2023 1656 by Johnnye Elpidio NOVAK, RN Outcome: Progressing Goal: Will remain free from infection Outcome: Progressing Goal: Diagnostic test results will improve Outcome: Progressing Goal: Respiratory complications will improve Outcome: Progressing Goal: Cardiovascular complication will be avoided Outcome: Progressing   Problem: Activity: Goal: Risk for activity intolerance will decrease Outcome: Progressing   Problem: Nutrition: Goal: Adequate nutrition will be maintained Outcome: Progressing   Problem: Coping: Goal: Level of anxiety will decrease Outcome: Progressing   Problem: Elimination: Goal: Will not experience complications related to bowel motility Outcome: Progressing Goal: Will not experience complications related to urinary retention Outcome: Progressing   Problem: Pain Management: Goal: General experience of comfort will improve Outcome: Progressing   Problem: Safety: Goal: Ability to remain free from injury will improve Outcome: Progressing   Problem: Skin Integrity: Goal: Risk for impaired skin integrity will decrease Outcome: Progressing   Problem: Education: Goal: Ability to demonstrate management of disease process will  improve Outcome: Progressing Goal: Ability to verbalize understanding of medication therapies will improve Outcome: Progressing Goal: Individualized Educational Video(s) Outcome: Progressing   Problem: Activity: Goal: Capacity to carry out activities will improve Outcome: Progressing   Problem: Cardiac: Goal: Ability to achieve and maintain adequate cardiopulmonary perfusion will improve Outcome: Progressing

## 2023-01-21 NOTE — Progress Notes (Signed)
 PROGRESS NOTE    Kelsey Salinas  FMW:[id number] DOB: 18-Nov-1926 DOA: 01/20/2023 PCP: Charlanne Fredia CROME, MD   Brief Narrative: 89 year old with past medical history significant for Alzheimer dementia, diastolic heart failure, hyperlipidemia, arthritis, history osteoporosis, recent diagnosis of ILD refused treatment and no further workup per notes, hypertension presents from [facility] with weakness altered mental status found to be hypoxic.  Patient admitted with pneumonia.  By CT chest, acute on chronic diastolic heart failure exacerbation.   Assessment & Plan:   Principal Problem:   Hypoxia Active Problems:   Acute heart failure with preserved ejection fraction (HCC)   Primary hypertension  1-Acute hypoxic respiratory failure in the setting of Pneumonia and Acute  Diastolic heart failure exacerbation- Community Acquired PNA.  -Patient presented with hypoxia oxygen  saturation 92 on 4 L of oxygen , report  cough, leukocytosis white blood cell 13.  CT chest obtained showed posterior right upper lobe pneumonia.  Trace right and minimal left pleural effusion.  Emphysema.SABRA  BNP elevated 700 -Continue IV ceftriaxone  and Zithromax . -Blood Culture: no growth to date.  -Continue with IV lasix  today, change to Three times per week orally.  -Continue with Pulmicort  and Brovana   wean off oxygen  as needed.   2-Elevation Troponin, Abnormal EKG. Some st change V 4, 5, 6. Chest pain free.  Cardiology consultation. Appreciate assistance. ECHO no wall motion abnormalities, EF normal    3-Acute Diastolic Heart failure exacerbation;  Patient presents with hypoxia,Elevated BNP 762, mild elevation of troponin 42--3 7 Continue with IV Lasix  today  Plan to resume Cozaar  today   4-Hypertension: Resume Lasix , metoprolol  Resume Cozaar   5-Dementia: Resume Aricept  and Namenda    6-history of pulmonary fibrosis on high-resolution CT. CT chest obtained for further evaluation, no significant progression of  pulmonary fibrosis on this noncontrast CT If no improvement could consider prednisone    UTI; urine growing E coli. Continue with Ceftriaxone .  Thrombocytopenia; monitor, likely related to infection.    Advance Care Planning:   Code Status: Prior Full code, Son with discussed with other siblings    Estimated body mass index is 24.24 kg/m as calculated from the following:   Height as of this encounter: 5' 8 (1.727 m).   Weight as of this encounter: 72.3 kg.   DVT prophylaxis: Lovenox  Code Status: Full code Family Communication: Son over phone on rounds Disposition Plan:  Status is: Observation The patient will require care spanning > 2 midnights and should be moved to inpatient because: Management of acute hypoxic respiratory failure    Consultants:  Cardiology   Procedures:  ECHO  Antimicrobials:    Subjective: She is feeling better, report cough. Denies chest pain or dyspnea.  Less confuse per caregiver   Objective: Vitals:   01/20/23 2051 01/20/23 2136 01/21/23 0058 01/21/23 0459  BP: (!) 189/93 (!) 189/93 (!) 161/82 (!) 164/89  Pulse: 84 84 (!) 50 77  Resp: 15  20 20   Temp: 97.6 F (36.4 C)  97.9 F (36.6 C) 97.7 F (36.5 C)  TempSrc: Oral  Oral Oral  SpO2: (!) 88%  90% 91%  Weight:    72.3 kg  Height:        Intake/Output Summary (Last 24 hours) at 01/21/2023 0744 Last data filed at 01/20/2023 2144 Gross per 24 hour  Intake 1072.33 ml  Output 350 ml  Net 722.33 ml   Filed Weights   01/20/23 1130 01/21/23 0459  Weight: 80.7 kg 72.3 kg    Examination:  General exam: Appears calm and comfortable  Respiratory system: BL crackles.  Cardiovascular system: S1 & S2 heard, RRR.  Gastrointestinal system: Abdomen is nondistended, soft and nontender. No organomegaly or masses felt. Normal bowel sounds heard. Central nervous system: Alert and oriented.  Extremities: Symmetric 5 x 5 power.   Data Reviewed: I have personally reviewed following labs and  imaging studies  CBC: Recent Labs  Lab 01/18/23 0000 01/20/23 0154 01/20/23 0526 01/21/23 0259  WBC 9.6 13.6*  --  11.7*  NEUTROABS  --  11.2*  --   --   HGB 15.9 15.6* 15.0 14.3  HCT 49* 48.7* 44.0 45.2  MCV  --  95.9  --  95.0  PLT 145* 128*  --  125*   Basic Metabolic Panel: Recent Labs  Lab 01/18/23 0000 01/20/23 0154 01/20/23 0526 01/20/23 0843 01/21/23 0259  NA 141 138 142  --  139  K 4.1 3.7 3.9  --  3.8  CL 106 104 107  --  106  CO2 23* 22  --   --  22  GLUCOSE  --  148* 137*  --  140*  BUN 26* 27* 28*  --  27*  CREATININE 1.3* 1.30* 1.20*  --  1.26*  CALCIUM  9.2 8.9  --   --  8.5*  MG  --   --   --  2.1  --    GFR: Estimated Creatinine Clearance: 27.5 mL/min (A) (by C-G formula based on SCr of 1.26 mg/dL (H)). Liver Function Tests: Recent Labs  Lab 01/18/23 0000 01/20/23 0154  AST 22 19  ALT 16 15  ALKPHOS 108 82  BILITOT  --  1.5*  PROT  --  6.8  ALBUMIN 3.8 3.4*   No results for input(s): LIPASE, AMYLASE in the last 168 hours. No results for input(s): AMMONIA in the last 168 hours. Coagulation Profile: No results for input(s): INR, PROTIME in the last 168 hours. Cardiac Enzymes: No results for input(s): CKTOTAL, CKMB, CKMBINDEX, TROPONINI in the last 168 hours. BNP (last 3 results) No results for input(s): PROBNP in the last 8760 hours. HbA1C: No results for input(s): HGBA1C in the last 72 hours. CBG: No results for input(s): GLUCAP in the last 168 hours. Lipid Profile: No results for input(s): CHOL, HDL, LDLCALC, TRIG, CHOLHDL, LDLDIRECT in the last 72 hours. Thyroid  Function Tests: No results for input(s): TSH, T4TOTAL, FREET4, T3FREE, THYROIDAB in the last 72 hours. Anemia Panel: No results for input(s): VITAMINB12, FOLATE, FERRITIN, TIBC, IRON, RETICCTPCT in the last 72 hours. Sepsis Labs: Recent Labs  Lab 01/20/23 0251 01/20/23 0531  LATICACIDVEN 2.4* 1.2    Recent  Results (from the past 240 hours)  Resp panel by RT-PCR (RSV, Flu A&B, Covid) Anterior Nasal Swab     Status: None   Collection Time: 01/20/23  3:29 AM   Specimen: Anterior Nasal Swab  Result Value Ref Range Status   SARS Coronavirus 2 by RT PCR NEGATIVE NEGATIVE Final   Influenza A by PCR NEGATIVE NEGATIVE Final   Influenza B by PCR NEGATIVE NEGATIVE Final    Comment: (NOTE) The Xpert Xpress SARS-CoV-2/FLU/RSV plus assay is intended as an aid in the diagnosis of influenza from Nasopharyngeal swab specimens and should not be used as a sole basis for treatment. Nasal washings and aspirates are unacceptable for Xpert Xpress SARS-CoV-2/FLU/RSV testing.  Fact Sheet for Patients: bloggercourse.com  Fact Sheet for Healthcare Providers: seriousbroker.it  This test is not yet approved or cleared by the [place]  FDA and has been authorized for  detection and/or diagnosis of SARS-CoV-2 by FDA under an Emergency Use Authorization (EUA). This EUA will remain in effect (meaning this test can be used) for the duration of the COVID-19 declaration under Section 564(b)(1) of the Act, 21 U.S.C. section 360bbb-3(b)(1), unless the authorization is terminated or revoked.     Resp Syncytial Virus by PCR NEGATIVE NEGATIVE Final    Comment: (NOTE) Fact Sheet for Patients: bloggercourse.com  Fact Sheet for Healthcare Providers: seriousbroker.it  This test is not yet approved or cleared by the [place]  FDA and has been authorized for detection and/or diagnosis of SARS-CoV-2 by FDA under an Emergency Use Authorization (EUA). This EUA will remain in effect (meaning this test can be used) for the duration of the COVID-19 declaration under Section 564(b)(1) of the Act, 21 U.S.C. section 360bbb-3(b)(1), unless the authorization is terminated or revoked.  Performed at [facility], [address] [place], [place] [address]          Radiology Studies: CT CHEST WO CONTRAST Result Date: 01/20/2023 CLINICAL DATA:  Chronic shortness of breath. EXAM: CT CHEST WITHOUT CONTRAST TECHNIQUE: Multidetector CT imaging of the chest was performed following the standard protocol without IV contrast. RADIATION DOSE REDUCTION: This exam was performed according to the departmental dose-optimization program which includes automated exposure control, adjustment of the mA and/or kV according to patient size and/or use of iterative reconstruction technique. COMPARISON:  Chest x-ray from same day. High-resolution chest CT dated October 08, 2021. FINDINGS: Cardiovascular: Chronic cardiomegaly. Similar trace pericardial fluid. No thoracic aortic aneurysm. Coronary, aortic arch, and branch vessel atherosclerotic vascular disease. Mediastinum/Nodes: No pathologically enlarged mediastinal or axillary lymph nodes. Prominent right paratracheal lymph node is likely reactive. Mildly patulous esophagus. Thyroid  gland and trachea demonstrate no significant findings. Lungs/Pleura: Moderate emphysema. Trace right and minimal left pleural effusions. New consolidation in the posterior right upper lobe. Subsegmental atelectasis in the medial right lower lobe. No pneumothorax. Upper Abdomen: No acute abnormality. Musculoskeletal: No acute or significant osseous findings. IMPRESSION: 1. Posterior right upper lobe pneumonia. 2. Trace right and minimal left pleural effusions. 3. Aortic Atherosclerosis (ICD10-I70.0) and Emphysema (ICD10-J43.9). Electronically Signed   By: Elsie ONEIDA Shoulder M.D.   On: 01/20/2023 09:52   DG Chest 2 View Result Date: 01/20/2023 CLINICAL DATA:  10031.  Coughing, weakness and altered mentation. EXAM: CHEST - 2 VIEW COMPARISON:  HRCT 10/08/2021 FINDINGS: The lungs are emphysematous but clear with slightly elevated right hemidiaphragm. Mild cardiomegaly without overt CHF. Tortuous aorta with patchy  calcifications and stable mediastinum. No pleural effusion is seen. Osteopenia. There is chronic rotator cuff arthropathy with glenohumeral DJD, degenerative change of the spine. IMPRESSION: 1. Emphysema without evidence of acute chest disease. Stable COPD chest. 2. Mild cardiomegaly without overt CHF. 3. Aortic atherosclerosis and uncoiling. Electronically Signed   By: Francis Quam M.D.   On: 01/20/2023 01:35        Scheduled Meds:  arformoterol   15 mcg Nebulization BID   atorvastatin   40 mg Oral Daily   budesonide  (PULMICORT ) nebulizer solution  0.25 mg Nebulization BID   docusate sodium   100 mg Oral BID   donepezil   10 mg Oral QHS   [START ON 01/22/2023] empagliflozin   10 mg Oral QAC breakfast   enoxaparin  (LOVENOX ) injection  30 mg Subcutaneous Daily   furosemide   40 mg Intravenous Daily   guaiFENesin   600 mg Oral BID   ipratropium-albuterol   3 mL Nebulization TID   losartan   100 mg Oral Daily   memantine   10 mg Oral BID   metoprolol  tartrate  150 mg Oral BID   Continuous Infusions:  azithromycin  Stopped (01/20/23 1336)   cefTRIAXone  (ROCEPHIN )  IV       LOS: 0 days    Time spent: 35 minutes    Ayline Dingus A Jalana Moore, MD [facility]   If 7PM-7AM, please contact night-coverage [email or web address]  01/21/2023, 7:44 AM

## 2023-01-21 NOTE — Progress Notes (Signed)
Heart Failure Navigator Progress Note  Assessed for Heart & Vascular TOC clinic readiness.  Patient does not meet criteria due to per MD note patient with Alzheimer's Dementia.   Navigator will sign off at this time.   Rhae Hammock, BSN, Scientist, clinical (histocompatibility and immunogenetics) Only

## 2023-01-21 NOTE — Plan of Care (Signed)
  Problem: Clinical Measurements: Goal: Respiratory complications will improve Outcome: Progressing Goal: Cardiovascular complication will be avoided Outcome: Progressing   Problem: Coping: Goal: Level of anxiety will decrease Outcome: Progressing   Problem: Safety: Goal: Ability to remain free from injury will improve Outcome: Progressing   Problem: Skin Integrity: Goal: Risk for impaired skin integrity will decrease Outcome: Progressing   Problem: Education: Goal: Ability to demonstrate management of disease process will improve Outcome: Progressing   Problem: Activity: Goal: Capacity to carry out activities will improve Outcome: Progressing   Problem: Cardiac: Goal: Ability to achieve and maintain adequate cardiopulmonary perfusion will improve Outcome: Progressing

## 2023-01-21 NOTE — Progress Notes (Signed)
 Patient Name: Kelsey Salinas Date of Encounter: 01/21/2023 [facility] Cardiologist: Shelda Bruckner, MD   Interval Summary  .    Feeling well.  Eager to go home.  Vital Signs .    Vitals:   01/20/23 2051 01/20/23 2136 01/21/23 0058 01/21/23 0459  BP: (!) 189/93 (!) 189/93 (!) 161/82 (!) 164/89  Pulse: 84 84 (!) 50 77  Resp: 15  20 20   Temp: 97.6 F (36.4 C)  97.9 F (36.6 C) 97.7 F (36.5 C)  TempSrc: Oral  Oral Oral  SpO2: (!) 88%  90% 91%  Weight:    72.3 kg  Height:        Intake/Output Summary (Last 24 hours) at 01/21/2023 0830 Last data filed at 01/20/2023 2144 Gross per 24 hour  Intake 1072.33 ml  Output 350 ml  Net 722.33 ml      01/21/2023    4:59 AM 01/20/2023   11:30 AM 08/31/2022   10:41 AM  Last 3 Weights  Weight (lbs) 159 lb 6.3 oz 177 lb 14.6 oz 178 lb  Weight (kg) 72.3 kg 80.7 kg 80.74 kg      Telemetry/ECG    Sinus rhythm.  PACs.  5 beats NSVT.  - Personally Reviewed  Echo 01/21/23:  1. Left ventricular ejection fraction, by estimation, is 60 to 65%. The  left ventricle has normal function. The left ventricle has no regional  wall motion abnormalities. There is mild concentric left ventricular  hypertrophy. Left ventricular diastolic  parameters are consistent with Grade I diastolic dysfunction (impaired  relaxation).   2. Peak RV-RA gradient 67 mmHg. Right ventricular systolic function is  normal. The right ventricular size is normal.   3. Left atrial size was severely dilated.   4. The mitral valve is normal in structure. Trivial mitral valve  regurgitation. No evidence of mitral stenosis.   5. The aortic valve is tricuspid. Aortic valve regurgitation is trivial.  No aortic stenosis is present.   6. The IVC was not well-visualized.   Physical Exam .    VS:  BP (!) 145/67 (BP Location: Left Arm)   Pulse 89   Temp 97.8 F (36.6 C) (Oral)   Resp 20   Ht 5' 8 (1.727 m)   Wt 72.3 kg   SpO2 91%   BMI 24.24 kg/m  , BMI  Body mass index is 24.24 kg/m. GENERAL:  Well appearing HEENT: Pupils equal round and reactive, fundi not visualized, oral mucosa unremarkable NECK:  No jugular venous distention, waveform within normal limits, carotid upstroke brisk and symmetric, no bruits, no thyromegaly LUNGS:  Clear to auscultation bilaterally HEART:  RRR.  PMI not displaced or sustained,S1 and S2 within normal limits, no S3, no S4, no clicks, no rubs, no murmurs ABD:  Flat, positive bowel sounds normal in frequency in pitch, no bruits, no rebound, no guarding, no midline pulsatile mass, no hepatomegaly, no splenomegaly EXT:  2 plus pulses throughout, no edema, no cyanosis no clubbing SKIN:  No rashes no nodules NEURO:  Cranial nerves II through XII grossly intact, motor grossly intact throughout PSYCH:  Cognitively intact, oriented to person place and time  Assessment & Plan .     89 y.o. female with a hx of Alzheimer's dementia, HFpEF, hyperlipidemia, ILD, and osteoporosis admitted with UTI and HCAP. Cardiology consulted for EKG changes and elevated troponin.   # Acute on chronic HFpEF: # Hypoxia: # HCAP: Patient presented from [facility] with pneumonia, UTI, and volume overload.  Continue antibiotics per primary team.  Will discontinue [name]  due to her UTI.  Lasix  was as needed prior to admission. Recommend changing it to every Monday, Wednesday and Friday.  Ok to take extra as needed for weight gain or shortness of breath.  Severely elevated pulmonary pressure with normal RV function.     # Hypertension:  BP uncontrolled. Continue metoprolol . Restarting home losartan  today.   # Demand ischemia: # EKG  changes:  # Hyperlipidemia: She denies chest pain.  Likely demand ischemia in the setting of underlying lung disease.  No ischemic evaluation planned.  Systolic function normal on echo.  Continue atorvastatin .     [facility] will sign off.   Medication Recommendations:  Change lasix  to 40mg  MWF.   OK to take extra as needed for weight gain and swelling.  Other recommendations (labs, testing, etc):  BMP in 1-2 weeks.  Follow up as an outpatient:  we will arrange.   For questions or updates, please contact [facility] Please consult [email or web address] for contact info under        Signed, Annabella Scarce, MD

## 2023-01-21 NOTE — Progress Notes (Signed)
  Echocardiogram 2D Echocardiogram has been performed.  Kelsey Salinas 01/21/2023, 9:35 AM

## 2023-01-22 DIAGNOSIS — R0902 Hypoxemia: Secondary | ICD-10-CM | POA: Diagnosis not present

## 2023-01-22 LAB — BASIC METABOLIC PANEL
Anion gap: 14 (ref 5–15)
BUN: 32 mg/dL — ABNORMAL HIGH (ref 8–23)
CO2: 23 mmol/L (ref 22–32)
Calcium: 8.3 mg/dL — ABNORMAL LOW (ref 8.9–10.3)
Chloride: 103 mmol/L (ref 98–111)
Creatinine, Ser: 1.45 mg/dL — ABNORMAL HIGH (ref 0.44–1.00)
GFR, Estimated: 33 mL/min — ABNORMAL LOW (ref >60)
Glucose, Bld: 176 mg/dL — ABNORMAL HIGH (ref 70–99)
Potassium: 3.4 mmol/L — ABNORMAL LOW (ref 3.5–5.1)
Sodium: 140 mmol/L (ref 135–145)

## 2023-01-22 LAB — CBC
HCT: 45.3 % (ref 36.0–46.0)
Hemoglobin: 14.1 g/dL (ref 12.0–15.0)
MCH: 29.9 pg (ref 26.0–34.0)
MCHC: 31.1 g/dL (ref 30.0–36.0)
MCV: 96 fL (ref 80.0–100.0)
Platelets: 126 10*3/uL — ABNORMAL LOW (ref 150–400)
RBC: 4.72 MIL/uL (ref 3.87–5.11)
RDW: 14.6 % (ref 11.5–15.5)
WBC: 9.6 10*3/uL (ref 4.0–10.5)
nRBC: 0 % (ref 0.0–0.2)

## 2023-01-22 LAB — URINE CULTURE: Culture: >=100000 — AB

## 2023-01-22 MED ORDER — POTASSIUM CHLORIDE CRYS ER 20 MEQ PO TBCR
20.0000 meq | EXTENDED_RELEASE_TABLET | Freq: Once | ORAL | Status: AC
  Administered 2023-01-22: 20 meq via ORAL
  Filled 2023-01-22: qty 1

## 2023-01-22 MED ORDER — FUROSEMIDE 40 MG PO TABS: 40.0000 mg | ORAL_TABLET | ORAL | 0 refills | Status: DC

## 2023-01-22 MED ORDER — AMOXICILLIN-POT CLAVULANATE 875-125 MG PO TABS: 1.0000 | ORAL_TABLET | Freq: Two times a day (BID) | ORAL | 0 refills | Status: AC

## 2023-01-22 MED ORDER — AZITHROMYCIN 500 MG PO TABS: 500.0000 mg | ORAL_TABLET | Freq: Every day | ORAL | 0 refills | Status: AC

## 2023-01-22 MED ORDER — GUAIFENESIN ER 600 MG PO TB12: 600.0000 mg | ORAL_TABLET | Freq: Two times a day (BID) | ORAL | 0 refills | Status: AC

## 2023-01-22 MED ORDER — TRELEGY ELLIPTA 200-62.5-25 MCG/ACT IN AEPB: 1.0000 | INHALATION_SPRAY | Freq: Every day | RESPIRATORY_TRACT | 5 refills | Status: AC

## 2023-01-22 MED ORDER — ALBUTEROL SULFATE HFA 108 (90 BASE) MCG/ACT IN AERS: 2.0000 | INHALATION_SPRAY | Freq: Four times a day (QID) | RESPIRATORY_TRACT | 2 refills | Status: DC | PRN

## 2023-01-22 NOTE — TOC Progression Note (Addendum)
 Transition of Care [facility]) - Progression Note    Patient Details  Name: Kelsey Salinas MRN: [id number] Date of Birth: February 17, 1926  [facility] of [facility]) CM/SW Contact  Marval Gell, RN Phone Number: 01/22/2023, 11:52 AM  Clinical Narrative:     Patient will DC today with new home oxygen , awaiting ambulatory sat note.  Will set up home oxygen  once sats obtained, bedside nursing aware    Oxygen  ordered through [employer]   PTAR forms on chart. Address verified with personal aid at bedside.   1/11 1600 Per nursing, [facility] has requested that patient be assessed for SNF, CSW notified   Expected Discharge Plan: Home w Home Health Services Barriers to Discharge: Equipment Delay  Expected Discharge Plan and Services In-house Referral: NA Discharge Planning Services: CM Consult Post Acute Care Choice: NA Living arrangements for the past 2 months: Independent Living Facility Expected Discharge Date: 01/22/23               DME Arranged: N/A DME Agency: NA       HH Arranged: NA           Social Determinants of Health (SDOH) Interventions SDOH Screenings   Food Insecurity: No Food Insecurity (01/20/2023)  Housing: Low Risk  (01/20/2023)  Transportation Needs: No Transportation Needs (01/20/2023)  Utilities: Not At Risk (01/20/2023)  Alcohol Screen: Low Risk  (08/03/2017)  Depression (PHQ2-9): Low Risk  (08/31/2022)  Financial Resource Strain: Low Risk  (07/19/2017)  Physical Activity: Insufficiently Active (07/19/2017)  Social Connections: Moderately Integrated (01/20/2023)  Stress: Stress Concern Present (07/19/2017)  Tobacco Use: Medium Risk (01/20/2023)    Readmission Risk Interventions     No data to display

## 2023-01-22 NOTE — Plan of Care (Signed)

## 2023-01-22 NOTE — Plan of Care (Signed)
?  Problem: Education: ?Goal: Ability to demonstrate management of disease process will improve ?Outcome: Progressing ?  ?Problem: Activity: ?Goal: Capacity to carry out activities will improve ?Outcome: Progressing ?  ?Problem: Cardiac: ?Goal: Ability to achieve and maintain adequate cardiopulmonary perfusion will improve ?Outcome: Progressing ?  ?

## 2023-01-22 NOTE — Progress Notes (Signed)
 SATURATION QUALIFICATIONS: (This note is used to comply with regulatory documentation for home oxygen )  Patient Saturations on Room Air at Rest = 84%   Patient Saturations on 3 Liters of oxygen  while Ambulating = 91%  Please briefly explain why patient needs home oxygen : Patient needed 3L to maintain saturation.

## 2023-01-22 NOTE — Discharge Summary (Addendum)
 Physician Discharge Summary   Patient: Kelsey Salinas MRN: [id number] DOB: 1926-01-24  Admit date:     01/20/2023  Discharge date: 01/22/23  Discharge Physician: Owen DELENA Lore   PCP: Charlanne Fredia CROME, MD   Recommendations at discharge:    Follow up after resolution of PNA.  Needs follow up with Pulmonologist Needs Goal  of care discussion in regards Code status.  Needs Bmet to follow renal function.   Discharge Diagnoses: Principal Problem:   Hypoxia Active Problems:   Acute heart failure with preserved ejection fraction (HCC)   Primary hypertension   Acute cystitis without hematuria  Resolved Problems:   * No resolved hospital problems. *  Hospital Course: 89 year old with past medical history significant for Alzheimer dementia, diastolic heart failure, hyperlipidemia, arthritis, history osteoporosis, recent diagnosis of ILD refused treatment and no further workup per notes, hypertension presents from [facility] with weakness altered mental status found to be hypoxic. Patient admitted with pneumonia. By CT chest, acute on chronic diastolic heart failure exacerbation.   Assessment and Plan:  1-Acute hypoxic respiratory failure in the setting of Pneumonia and Acute  Diastolic heart failure exacerbation- Community Acquired PNA.  -Patient presented with hypoxia oxygen  saturation 92 on 4 L of oxygen , report  cough, leukocytosis white blood cell 13.  CT chest obtained showed posterior right upper lobe pneumonia.  Trace right and minimal left pleural effusion.  Emphysema.SABRA  BNP elevated 700 -Treated  IV ceftriaxone  and Zithromax . 2 days -Blood Culture: no growth to date.  -Continue with Pulmicort  and Brovana --change to Trelegy at discharge  Still requiring 3 L oxygen , she will need to be discharge on oxygen , already has some lung affection from ILD, and now with PNA Discharge on Augmentin  for 5 days, Azithro 3 more days to complete 5 days.  Lasix  M, W, F.     2-Elevation  Troponin, Abnormal EKG. Some st change V 4, 5, 6. Chest pain free.  Cardiology consultation. Appreciate assistance. ECHO no wall motion abnormalities, EF normal    3-Acute Diastolic Heart failure exacerbation;  Patient presents with hypoxia,Elevated BNP 762, mild elevation of troponin 42--3 7 Lasix  change to M, W, F.  Mildly increase cr, suspect will decrease, she wot get lasix  until Monday.  Plan to resume Cozaar     4-Hypertension: Resume Lasix , metoprolol  Resume Cozaar   5-Dementia: Resume Aricept  and Namenda    6-history of pulmonary fibrosis on high-resolution CT. CT chest obtained for further evaluation, no significant progression of pulmonary fibrosis on this noncontrast CT If no improvement could consider prednisone  Stable.  UTI; urine growing E coli. Treated with Ceftriaxone .  Discharge on Augmentin  to cover for PNA as well.  Hypokalemia; replete  Thrombocytopenia; monitor, likely related to infection.         Estimated body mass index is 24.24 kg/m as calculated from the following:   Height as of this encounter: 5' 8 (1.727 m).   Weight as of this encounter: 72.3 kg.    Addendum; [facility] won accept patient because she needed two person assist for dressing. Will get PT, OT      Consultants: Cardiology  Procedures performed: ECHO Disposition: Home Diet recommendation:  Discharge Diet Orders (From admission, onward)     Start     Ordered   01/22/23 0000  Diet - low sodium heart healthy        01/22/23 1016           Cardiac diet DISCHARGE MEDICATION: Allergies as of 01/22/2023  Reactions   Amlodipine Besylate Other (See Comments)   Edema   Lisinopril Cough   Cough   Sulfamethoxazole-trimethoprim Other (See Comments)   Unknown reaction        Medication List     TAKE these medications    acetaminophen  500 MG tablet Commonly known as: TYLENOL  Take 500 mg by mouth daily.   albuterol  108 (90 Base) MCG/ACT inhaler Commonly known as:  VENTOLIN  HFA Inhale 2 puffs into the lungs every 6 (six) hours as needed for wheezing or shortness of breath.   amoxicillin -clavulanate 875-125 MG tablet Commonly known as: AUGMENTIN  Take 1 tablet by mouth 2 (two) times daily for 5 days.   atorvastatin  40 MG tablet Commonly known as: LIPITOR TAKE 1 TABLET ONCE DAILY.   azithromycin  500 MG tablet Commonly known as: ZITHROMAX  Take 1 tablet (500 mg total) by mouth daily for 3 days.   Cholecalciferol 1.25 MG (50000 UT) capsule Take 50,000 Units by mouth daily.   cyanocobalamin  1000 MCG tablet Commonly known as: VITAMIN B12 Take 1 tablet (1,000 mcg total) by mouth daily.   donepezil  10 MG tablet Commonly known as: ARICEPT  TAKE ONE TABLET BY MOUTH AT BEDTIME   empagliflozin  10 MG Tabs tablet Commonly known as: [name]  Take 1 tablet (10 mg total) by mouth daily before breakfast.   famotidine  20 MG tablet Commonly known as: PEPCID  Take 20 mg by mouth daily.   fexofenadine  180 MG tablet Commonly known as: ALLEGRA  Take 1 tablet (180 mg total) by mouth daily.   furosemide  40 MG tablet Commonly known as: LASIX  Take 1 tablet (40 mg total) by mouth every Monday, Wednesday, and Friday. Start taking on: January 24, 2023 What changed:  when to take this reasons to take this   guaiFENesin  600 MG 12 hr tablet Commonly known as: MUCINEX  Take 1 tablet (600 mg total) by mouth 2 (two) times daily.   losartan  100 MG tablet Commonly known as: COZAAR  Take 100 mg by mouth daily.   memantine  10 MG tablet Commonly known as: NAMENDA  TAKE 1 TABLET BY MOUTH TWICE DAILY.   metoprolol  succinate 100 MG 24 hr tablet Commonly known as: TOPROL -XL TAKE 1 AND 1/2 TABLETS BY MOUTH IN THE MORNING AND AT BEDTIME   polyethylene glycol 17 g packet Commonly known as: MIRALAX  / GLYCOLAX  Take 17 g by mouth daily.   potassium chloride  SA 20 MEQ tablet Commonly known as: KLOR-CON  M Take 1 tablet (20 mEq total) by mouth once as needed for up to 1  dose. Take it if takes Lasix  2/week   sodium chloride  0.65 % Soln nasal spray Commonly known as: OCEAN Place 1 spray into both nostrils as needed for congestion.   Trelegy Ellipta  200-62.5-25 MCG/ACT Aepb Generic drug: Fluticasone -Umeclidin-Vilant Inhale 1 puff into the lungs daily.   triamcinolone  55 MCG/ACT Aero nasal inhaler Commonly known as: NASACORT  Place 2 sprays into the nose daily.   Voltaren  1 % Gel Generic drug: diclofenac  Sodium Apply 2 g topically 4 (four) times daily as needed (arthritis pain).               Durable Medical Equipment  (From admission, onward)           Start     Ordered   01/22/23 0953  For home use only DME oxygen   Once       Question Answer Comment  Length of Need 6 Months   Mode or (Route) Nasal cannula   Liters per Minute 3   Frequency  Continuous (stationary and portable oxygen  unit needed)   Oxygen  delivery system Gas      01/22/23 0952            Discharge Exam: Filed Weights   01/20/23 1130 01/21/23 0459 01/22/23 0332  Weight: 80.7 kg 72.3 kg 71.9 kg   General NAD.  Patient feeling well, denies dyspnea, no worsening cough. She feels better, she wishes to go home.   Condition at discharge: stable  The results of significant diagnostics from this hospitalization (including imaging, microbiology, ancillary and laboratory) are listed below for reference.   Imaging Studies: ECHOCARDIOGRAM COMPLETE Result Date: 01/21/2023    ECHOCARDIOGRAM REPORT   Patient Name:   JUNI GLAAB Date of Exam: 01/21/2023 Medical Rec #:  [id number]       Height:       68.0 in Accession #:    [id number]      Weight:       159.4 lb Date of Birth:  1926/03/23       BSA:          1.856 m Patient Age:    89 years        BP:           164/89 mmHg Patient Gender: F               HR:           70 bpm. Exam Location:  Inpatient Procedure: 2D Echo, Cardiac Doppler and Color Doppler Indications:    Congestive Heart Failure  History:        Patient has  prior history of Echocardiogram examinations, most                 recent 09/10/2021. Arrythmias:PAC and PVC; Risk                 Factors:Hypertension, Dyslipidemia and Former Smoker.  Sonographer:    Ozell Free Referring Phys: [id number] TIFFANY [place] IMPRESSIONS  1. Left ventricular ejection fraction, by estimation, is 60 to 65%. The left ventricle has normal function. The left ventricle has no regional wall motion abnormalities. There is mild concentric left ventricular hypertrophy. Left ventricular diastolic parameters are consistent with Grade I diastolic dysfunction (impaired relaxation).  2. Peak RV-RA gradient 67 mmHg. Right ventricular systolic function is normal. The right ventricular size is normal.  3. Left atrial size was severely dilated.  4. The mitral valve is normal in structure. Trivial mitral valve regurgitation. No evidence of mitral stenosis.  5. The aortic valve is tricuspid. Aortic valve regurgitation is trivial. No aortic stenosis is present.  6. The IVC was not well-visualized. FINDINGS  Left Ventricle: Left ventricular ejection fraction, by estimation, is 60 to 65%. The left ventricle has normal function. The left ventricle has no regional wall motion abnormalities. The left ventricular internal cavity size was normal in size. There is  mild concentric left ventricular hypertrophy. Left ventricular diastolic parameters are consistent with Grade I diastolic dysfunction (impaired relaxation). Right Ventricle: Peak RV-RA gradient 67 mmHg. The right ventricular size is normal. No increase in right ventricular wall thickness. Right ventricular systolic function is normal. Left Atrium: Left atrial size was severely dilated. Right Atrium: Right atrial size was normal in size. Pericardium: There is no evidence of pericardial effusion. Mitral Valve: The mitral valve is normal in structure. Trivial mitral valve regurgitation. No evidence of mitral valve stenosis. Tricuspid Valve: The tricuspid valve  is normal in structure. Tricuspid valve regurgitation is mild. Aortic Valve:  The aortic valve is tricuspid. Aortic valve regurgitation is trivial. Aortic regurgitation PHT measures 449 msec. No aortic stenosis is present. Aortic valve mean gradient measures 3.0 mmHg. Aortic valve peak gradient measures 6.1 mmHg. Aortic valve area, by VTI measures 2.60 cm. Pulmonic Valve: The pulmonic valve was normal in structure. Pulmonic valve regurgitation is trivial. Aorta: The aortic root is normal in size and structure. Venous: The IVC was not well-visualized. The inferior vena cava was not well visualized. IAS/Shunts: No atrial level shunt detected by color flow Doppler.  LEFT VENTRICLE PLAX 2D LVIDd:         4.40 cm   Diastology LVIDs:         2.60 cm   LV e' medial:    2.94 cm/s LV PW:         1.00 cm   LV E/e' medial:  21.2 LV IVS:        1.30 cm   LV e' lateral:   6.09 cm/s LVOT diam:     2.10 cm   LV E/e' lateral: 10.2 LV SV:         51 LV SV Index:   27 LVOT Area:     3.46 cm  RIGHT VENTRICLE RV Basal diam:  3.70 cm RV S prime:     14.00 cm/s TAPSE (M-mode): 2.3 cm LEFT ATRIUM             Index        RIGHT ATRIUM           Index LA diam:        4.70 cm 2.53 cm/m   RA Area:     16.90 cm LA Vol (A2C):   93.1 ml 50.17 ml/m  RA Volume:   42.80 ml  23.06 ml/m LA Vol (A4C):   97.5 ml 52.54 ml/m LA Biplane Vol: 96.2 ml 51.84 ml/m  AORTIC VALVE AV Area (Vmax):    2.61 cm AV Area (Vmean):   2.75 cm AV Area (VTI):     2.60 cm AV Vmax:           123.00 cm/s AV Vmean:          83.800 cm/s AV VTI:            0.196 m AV Peak Grad:      6.1 mmHg AV Mean Grad:      3.0 mmHg LVOT Vmax:         92.80 cm/s LVOT Vmean:        66.500 cm/s LVOT VTI:          0.147 m LVOT/AV VTI ratio: 0.75 AI PHT:            449 msec  AORTA Ao Root diam: 3.70 cm Ao Asc diam:  3.00 cm MITRAL VALVE               TRICUSPID VALVE MV Area (PHT): 4.19 cm    TR Peak grad:   66.9 mmHg MV Decel Time: 181 msec    TR Vmax:        409.00 cm/s MV E velocity:  62.20 cm/s MV A velocity: 81.20 cm/s  SHUNTS MV E/A ratio:  0.77        Systemic VTI:  0.15 m                            Systemic Diam: 2.10 cm [employer] Electronically signed by Ezra  McleanMD Signature Date/Time: 01/21/2023/9:44:13 AM    Final    CT CHEST WO CONTRAST Result Date: 01/20/2023 CLINICAL DATA:  Chronic shortness of breath. EXAM: CT CHEST WITHOUT CONTRAST TECHNIQUE: Multidetector CT imaging of the chest was performed following the standard protocol without IV contrast. RADIATION DOSE REDUCTION: This exam was performed according to the departmental dose-optimization program which includes automated exposure control, adjustment of the mA and/or kV according to patient size and/or use of iterative reconstruction technique. COMPARISON:  Chest x-ray from same day. High-resolution chest CT dated October 08, 2021. FINDINGS: Cardiovascular: Chronic cardiomegaly. Similar trace pericardial fluid. No thoracic aortic aneurysm. Coronary, aortic arch, and branch vessel atherosclerotic vascular disease. Mediastinum/Nodes: No pathologically enlarged mediastinal or axillary lymph nodes. Prominent right paratracheal lymph node is likely reactive. Mildly patulous esophagus. Thyroid  gland and trachea demonstrate no significant findings. Lungs/Pleura: Moderate emphysema. Trace right and minimal left pleural effusions. New consolidation in the posterior right upper lobe. Subsegmental atelectasis in the medial right lower lobe. No pneumothorax. Upper Abdomen: No acute abnormality. Musculoskeletal: No acute or significant osseous findings. IMPRESSION: 1. Posterior right upper lobe pneumonia. 2. Trace right and minimal left pleural effusions. 3. Aortic Atherosclerosis (ICD10-I70.0) and Emphysema (ICD10-J43.9). Electronically Signed   By: Elsie ONEIDA Shoulder M.D.   On: 01/20/2023 09:52   DG Chest 2 View Result Date: 01/20/2023 CLINICAL DATA:  10031.  Coughing, weakness and altered mentation. EXAM: CHEST - 2 VIEW  COMPARISON:  HRCT 10/08/2021 FINDINGS: The lungs are emphysematous but clear with slightly elevated right hemidiaphragm. Mild cardiomegaly without overt CHF. Tortuous aorta with patchy calcifications and stable mediastinum. No pleural effusion is seen. Osteopenia. There is chronic rotator cuff arthropathy with glenohumeral DJD, degenerative change of the spine. IMPRESSION: 1. Emphysema without evidence of acute chest disease. Stable COPD chest. 2. Mild cardiomegaly without overt CHF. 3. Aortic atherosclerosis and uncoiling. Electronically Signed   By: Francis Quam M.D.   On: 01/20/2023 01:35    Microbiology: Results for orders placed or performed during the hospital encounter of 01/20/23  Resp panel by RT-PCR (RSV, Flu A&B, Covid) Anterior Nasal Swab     Status: None   Collection Time: 01/20/23  3:29 AM   Specimen: Anterior Nasal Swab  Result Value Ref Range Status   SARS Coronavirus 2 by RT PCR NEGATIVE NEGATIVE Final   Influenza A by PCR NEGATIVE NEGATIVE Final   Influenza B by PCR NEGATIVE NEGATIVE Final    Comment: (NOTE) The Xpert Xpress SARS-CoV-2/FLU/RSV plus assay is intended as an aid in the diagnosis of influenza from Nasopharyngeal swab specimens and should not be used as a sole basis for treatment. Nasal washings and aspirates are unacceptable for Xpert Xpress SARS-CoV-2/FLU/RSV testing.  Fact Sheet for Patients: bloggercourse.com  Fact Sheet for Healthcare Providers: seriousbroker.it  This test is not yet approved or cleared by the [place]  FDA and has been authorized for detection and/or diagnosis of SARS-CoV-2 by FDA under an Emergency Use Authorization (EUA). This EUA will remain in effect (meaning this test can be used) for the duration of the COVID-19 declaration under Section 564(b)(1) of the Act, 21 U.S.C. section 360bbb-3(b)(1), unless the authorization is terminated or revoked.     Resp Syncytial Virus by  PCR NEGATIVE NEGATIVE Final    Comment: (NOTE) Fact Sheet for Patients: bloggercourse.com  Fact Sheet for Healthcare Providers: seriousbroker.it  This test is not yet approved or cleared by the [place]  FDA and has been authorized for detection and/or diagnosis of SARS-CoV-2 by FDA under an  Emergency Use Authorization (EUA). This EUA will remain in effect (meaning this test can be used) for the duration of the COVID-19 declaration under Section 564(b)(1) of the Act, 21 U.S.C. section 360bbb-3(b)(1), unless the authorization is terminated or revoked.  Performed at [facility], [address], [place], [place] [address]   Urine Culture     Status: Abnormal   Collection Time: 01/20/23  4:15 AM   Specimen: Urine, Random  Result Value Ref Range Status   Specimen Description URINE, RANDOM  Final   Special Requests NONE Reflexed from [id number]  Final   Culture (A)  Final    >=100,000 COLONIES/mL ESCHERICHIA COLI Two isolates with different morphologies were identified as the same organism.The most resistant organism was reported. Performed at [facility], [address], [place], [place] [address]    Report Status 01/22/2023 FINAL  Final   Organism ID, Bacteria ESCHERICHIA COLI (A)  Final      Susceptibility   Escherichia coli - MIC*    AMPICILLIN <=2 SENSITIVE Sensitive     CEFAZOLIN <=4 SENSITIVE Sensitive     CEFEPIME  <=0.12 SENSITIVE Sensitive     CEFTRIAXONE  <=0.25 SENSITIVE Sensitive     CIPROFLOXACIN <=0.25 SENSITIVE Sensitive     GENTAMICIN <=1 SENSITIVE Sensitive     IMIPENEM <=0.25 SENSITIVE Sensitive     NITROFURANTOIN 32 SENSITIVE Sensitive     TRIMETH/SULFA <=20 SENSITIVE Sensitive     AMPICILLIN/SULBACTAM <=2 SENSITIVE Sensitive     PIP/TAZO <=4 SENSITIVE Sensitive ug/mL    * >=100,000 COLONIES/mL ESCHERICHIA COLI  Culture, blood (Routine X 2) w Reflex to ID Panel     Status: None (Preliminary  result)   Collection Time: 01/20/23  4:30 PM   Specimen: BLOOD LEFT ARM  Result Value Ref Range Status   Specimen Description BLOOD LEFT ARM  Final   Special Requests   Final    BOTTLES DRAWN AEROBIC ONLY Blood Culture results may not be optimal due to an inadequate volume of blood received in culture bottles   Culture   Final    NO GROWTH 2 DAYS Performed at [facility], [address] [place], [place] [address]    Report Status PENDING  Incomplete  Culture, blood (Routine X 2) w Reflex to ID Panel     Status: None (Preliminary result)   Collection Time: 01/20/23  4:31 PM   Specimen: BLOOD LEFT ARM  Result Value Ref Range Status   Specimen Description BLOOD LEFT ARM  Final   Special Requests   Final    BOTTLES DRAWN AEROBIC ONLY Blood Culture results may not be optimal due to an inadequate volume of blood received in culture bottles   Culture   Final    NO GROWTH 2 DAYS Performed at [facility], [address], [place], [place] [address]    Report Status PENDING  Incomplete    Labs: CBC: Recent Labs  Lab 01/18/23 0000 01/20/23 0154 01/20/23 0526 01/21/23 0259 01/22/23 0814  WBC 9.6 13.6*  --  11.7* 9.6  NEUTROABS  --  11.2*  --   --   --   HGB 15.9 15.6* 15.0 14.3 14.1  HCT 49* 48.7* 44.0 45.2 45.3  MCV  --  95.9  --  95.0 96.0  PLT 145* 128*  --  125* 126*   Basic Metabolic Panel: Recent Labs  Lab 01/18/23 0000 01/20/23 0154 01/20/23 0526 01/20/23 0843 01/21/23 0259 01/22/23 0814  NA 141  138 142  --  139 140  K 4.1 3.7 3.9  --  3.8 3.4*  CL 106 104 107  --  106 103  CO2 23* 22  --   --  22 23  GLUCOSE  --  148* 137*  --  140* 176*  BUN 26* 27* 28*  --  27* 32*  CREATININE 1.3* 1.30* 1.20*  --  1.26* 1.45*  CALCIUM  9.2 8.9  --   --  8.5* 8.3*  MG  --   --   --  2.1  --   --    Liver Function Tests: Recent Labs  Lab 01/18/23 0000 01/20/23 0154  AST 22 19  ALT 16 15  ALKPHOS 108 82  BILITOT  --  1.5*  PROT  --  6.8  ALBUMIN 3.8 3.4*    CBG: No results for input(s): GLUCAP in the last 168 hours.  Discharge time spent: greater than 30 minutes.  Signed: Owen DELENA Lore, MD [facility] 01/22/2023

## 2023-01-23 ENCOUNTER — Inpatient Hospital Stay (HOSPITAL_COMMUNITY): Payer: Medicare Other

## 2023-01-23 DIAGNOSIS — R0902 Hypoxemia: Secondary | ICD-10-CM | POA: Diagnosis not present

## 2023-01-23 DIAGNOSIS — N3000 Acute cystitis without hematuria: Secondary | ICD-10-CM | POA: Diagnosis not present

## 2023-01-23 DIAGNOSIS — I5031 Acute diastolic (congestive) heart failure: Secondary | ICD-10-CM | POA: Diagnosis not present

## 2023-01-23 LAB — BASIC METABOLIC PANEL
Anion gap: 14 (ref 5–15)
BUN: 38 mg/dL — ABNORMAL HIGH (ref 8–23)
CO2: 24 mmol/L (ref 22–32)
Calcium: 8.5 mg/dL — ABNORMAL LOW (ref 8.9–10.3)
Chloride: 105 mmol/L (ref 98–111)
Creatinine, Ser: 1.37 mg/dL — ABNORMAL HIGH (ref 0.44–1.00)
GFR, Estimated: 36 mL/min — ABNORMAL LOW (ref >60)
Glucose, Bld: 131 mg/dL — ABNORMAL HIGH (ref 70–99)
Potassium: 3.8 mmol/L (ref 3.5–5.1)
Sodium: 143 mmol/L (ref 135–145)

## 2023-01-23 LAB — BRAIN NATRIURETIC PEPTIDE: B Natriuretic Peptide: 1420.7 pg/mL — ABNORMAL HIGH (ref 0.0–100.0)

## 2023-01-23 MED ORDER — POTASSIUM CHLORIDE CRYS ER 20 MEQ PO TBCR
20.0000 meq | EXTENDED_RELEASE_TABLET | Freq: Once | ORAL | Status: AC
Start: 2023-01-23 — End: 2023-01-23
  Administered 2023-01-23: 20 meq via ORAL
  Filled 2023-01-23: qty 1

## 2023-01-23 MED ORDER — FUROSEMIDE 10 MG/ML IJ SOLN
40.0000 mg | Freq: Once | INTRAMUSCULAR | Status: AC
  Administered 2023-01-23: 40 mg via INTRAVENOUS
  Filled 2023-01-23: qty 4

## 2023-01-23 NOTE — Evaluation (Signed)
 Physical Therapy Evaluation Patient Details Name: Kelsey Salinas MRN: [id number] DOB: 28-Sep-1926 Today's Date: 01/23/2023  History of Present Illness  89 year old with past medical history significant for Alzheimer dementia, diastolic heart failure, hyperlipidemia, arthritis, history osteoporosis, recent diagnosis of ILD, hypertension who presented from [facility] with weakness altered mental status found to be hypoxic. Patient admitted with pneumonia.  Clinical Impression  Pt is presenting below baseline level of functioning. Previously pt was able to transfer with assist stabilizing W/C. Currently pt is Mod A for sit to stand from elevated EOB and Min A for bed mobility with HOB elevated; pt does not have an adjustable bed at home. Due to pt current functional status, home set up and available assistance at home recommending skilled physical therapy services < 3 hours/day in order to address strength, balance and functional mobility to decrease risk for falls, injury, immobility, skin break down and re-hospitalization.          If plan is discharge home, recommend the following: A lot of help with walking and/or transfers;Assistance with cooking/housework;Assist for transportation   Can travel by private vehicle   No    Equipment Recommendations Other (comment) (defer to post acute)     Functional Status Assessment Patient has had a recent decline in their functional status and demonstrates the ability to make significant improvements in function in a reasonable and predictable amount of time.     Precautions / Restrictions Precautions Precautions: Fall Precaution Comments: O2 sats Restrictions Weight Bearing Restrictions Per Provider Order: No      Mobility  Bed Mobility Overal bed mobility: Needs Assistance Bed Mobility: Supine to Sit, Sit to Supine     Supine to sit: Min assist, HOB elevated, Used rails Sit to supine: Min assist, Used rails   General bed mobility  comments: Min A for trunk to mid line from supine.    Transfers Overall transfer level: Needs assistance Equipment used: Rolling walker (2 wheels) Transfers: Sit to/from Stand Sit to Stand: Mod assist, From elevated surface           General transfer comment: Mod A for sit to stand from EOB with physical assist to get momentum to get to standing from sitting with hospital bed elevated from lowest position. Pt attempted to stand with rocking initially but was unable without assistance. Pt stood for ~15 seconds then poor eccentric control to sitting EOB.    Ambulation/Gait     General Gait Details: pt is non-ambulatory at baseline.       Balance Overall balance assessment: Needs assistance Sitting-balance support: Feet supported Sitting balance-Leahy Scale: Fair   Postural control: Posterior lean Standing balance support: Bilateral upper extremity supported Standing balance-Leahy Scale: Poor Standing balance comment: needs Min A to remain standing.         Pertinent Vitals/Pain Pain Assessment Pain Assessment: No/denies pain    Home Living Family/patient expects to be discharged to:: Assisted living   Home Equipment: Shower seat - built in;Grab bars - toilet;Grab bars - tub/shower;Rolling Walker (2 wheels);Rollator (4 wheels);Wheelchair - manual      Prior Function Prior Level of Function : Needs assist             Mobility Comments: Performs sit to stand transfers to wheelchair with assist of stabilizing W/C while she transfers. ADLs Comments: Assist with ADL, iADL and mobility via PCA 24 hour care.     Extremity/Trunk Assessment   Upper Extremity Assessment Upper Extremity Assessment: Defer to OT evaluation  Lower Extremity Assessment Lower Extremity Assessment: Generalized weakness    Cervical / Trunk Assessment Cervical / Trunk Assessment: Kyphotic  Communication   Communication Communication: No apparent difficulties Cueing Techniques:  Verbal cues;Gestural cues;Tactile cues  Cognition Arousal: Alert Behavior During Therapy: Agitated, Flat affect Overall Cognitive Status: History of cognitive impairments - at baseline                   Assessment/Plan    PT Assessment Patient needs continued PT services  PT Problem List Decreased strength;Decreased activity tolerance;Decreased mobility;Decreased safety awareness;Decreased cognition;Decreased balance       PT Treatment Interventions DME instruction;Therapeutic activities;Balance training;Functional mobility training;Therapeutic exercise;Patient/family education    PT Goals (Current goals can be found in the Care Plan section)  Acute Rehab PT Goals Patient Stated Goal: pt would like to return home to ALF PT Goal Formulation: With patient Time For Goal Achievement: 02/06/23 Potential to Achieve Goals: Fair    Frequency Min 1X/week        AM-PAC PT 6 Clicks Mobility  Outcome Measure Help needed turning from your back to your side while in a flat bed without using bedrails?: A Little Help needed moving from lying on your back to sitting on the side of a flat bed without using bedrails?: A Little Help needed moving to and from a bed to a chair (including a wheelchair)?: A Lot Help needed standing up from a chair using your arms (e.g., wheelchair or bedside chair)?: A Lot Help needed to walk in hospital room?: Total Help needed climbing 3-5 steps with a railing? : Total 6 Click Score: 12    End of Session Equipment Utilized During Treatment: Gait belt Activity Tolerance: Patient tolerated treatment well;Patient limited by fatigue;Treatment limited secondary to agitation Patient left: in bed;with call bell/phone within reach;with nursing/sitter in room Nurse Communication: Mobility status PT Visit Diagnosis: Other abnormalities of gait and mobility (R26.89);Muscle weakness (generalized) (M62.81)    Time: 8842-8785 PT Time Calculation (min) (ACUTE  ONLY): 17 min   Charges:   PT Evaluation $PT Eval Low Complexity: 1 Low   PT General Charges $$ ACUTE PT VISIT: 1 Visit        Dorothyann Maier, DPT, CLT  Acute Rehabilitation Services Office: [phone] (Secure chat preferred)   Dorothyann VEAR Maier 01/23/2023, 12:38 PM

## 2023-01-23 NOTE — Evaluation (Signed)
 Occupational Therapy Evaluation Patient Details Name: Kelsey Salinas MRN: [id number] DOB: [birth year] Today's Date: 01/23/2023   History of Present Illness 89 year old with past medical history significant for Alzheimer dementia, diastolic heart failure, hyperlipidemia, arthritis, history osteoporosis, recent diagnosis of ILD, hypertension who presented from [facility] with weakness altered mental status found to be hypoxic. Patient admitted with pneumonia.   Clinical Impression   Patient admitted for the diagnosis above.  PTA she lives at a local ALF, and has 24 hour PCA assist.  Per PCA, patient needs assist with simple stand pivot transfers to a wheelchair, ADL assist, iADL assist and assist with mobility.  Patient is close to baseline for ADL completion, and OT unable to assess transfers due to refusal.  PT Consult is pending, perhaps she will work with them.  No OT needs exist, and no post acute OT is recommended given 24 hour care.         If plan is discharge home, recommend the following: Assist for transportation;Assistance with cooking/housework;A lot of help with bathing/dressing/bathroom;A lot of help with walking and/or transfers;Supervision due to cognitive status    Functional Status Assessment  Patient has not had a recent decline in their functional status  Equipment Recommendations  None recommended by OT    Recommendations for Other Services       Precautions / Restrictions Precautions Precautions: Fall Precaution Comments: O2 sats Restrictions Weight Bearing Restrictions Per Provider Order: No      Mobility Bed Mobility Overal bed mobility: Needs Assistance Bed Mobility: Supine to Sit, Sit to Supine     Supine to sit: Mod assist Sit to supine: Mod assist     Patient Response: Cooperative  Transfers                   General transfer comment: refused      Balance Overall balance assessment: Needs assistance Sitting-balance support: Feet  supported Sitting balance-Leahy Scale: Fair                                     ADL either performed or assessed with clinical judgement   ADL Overall ADL's : At baseline                                       General ADL Comments: Assist as needed per PCA.  Patient able to wash face and upper body, PCA assist with rest of ADL, toileting and transfers to/from w/c     Vision Patient Visual Report: No change from baseline       Perception Perception: Not tested       Praxis Praxis: Not tested       Pertinent Vitals/Pain Pain Assessment Pain Assessment: No/denies pain     Extremity/Trunk Assessment Upper Extremity Assessment Upper Extremity Assessment: Overall WFL for tasks assessed   Lower Extremity Assessment Lower Extremity Assessment: Defer to PT evaluation   Cervical / Trunk Assessment Cervical / Trunk Assessment: Kyphotic   Communication Communication Communication: No apparent difficulties   Cognition Arousal: Alert Behavior During Therapy: Agitated, Flat affect Overall Cognitive Status: History of cognitive impairments - at baseline  General Comments   VSS on supplemental O2    Exercises     Shoulder Instructions      Home Living Family/patient expects to be discharged to:: Assisted living                             Home Equipment: Shower seat - built in;Grab bars - toilet;Grab bars - tub/shower;Rolling Walker (2 wheels);Rollator (4 wheels);Wheelchair - manual          Prior Functioning/Environment Prior Level of Function : Needs assist             Mobility Comments: Performs sit to stand trasfers to wheelchair with assist. ADLs Comments: Assist with ADL, iADL and mobility via PCA 24 hour care.        OT Problem List: Decreased activity tolerance;Impaired balance (sitting and/or standing)      OT Treatment/Interventions:      OT  Goals(Current goals can be found in the care plan section) Acute Rehab OT Goals Patient Stated Goal: Go home OT Goal Formulation: With patient Time For Goal Achievement: 01/28/23 Potential to Achieve Goals: Good  OT Frequency:      Co-evaluation              AM-PAC OT 6 Clicks Daily Activity     Outcome Measure Help from another person eating meals?: A Little Help from another person taking care of personal grooming?: A Little Help from another person toileting, which includes using toliet, bedpan, or urinal?: A Lot Help from another person bathing (including washing, rinsing, drying)?: A Lot Help from another person to put on and taking off regular upper body clothing?: A Lot Help from another person to put on and taking off regular lower body clothing?: A Lot 6 Click Score: 14   End of Session Nurse Communication: Mobility status  Activity Tolerance: Treatment limited secondary to agitation Patient left: in bed;with call bell/phone within reach;with bed alarm set;with family/visitor present  OT Visit Diagnosis: Muscle weakness (generalized) (M62.81)                Time: 9140-9084 OT Time Calculation (min): 16 min Charges:  OT General Charges $OT Visit: 1 Visit OT Evaluation $OT Eval Moderate Complexity: 1 Mod  01/23/2023  RP, OTR/L  Acute Rehabilitation Services  Office:  [phone]   Kelsey Salinas 01/23/2023, 9:43 AM

## 2023-01-23 NOTE — Progress Notes (Signed)
 PROGRESS NOTE    Kelsey Salinas  FMW:[id number] DOB: [birth year] DOA: 01/20/2023 PCP: Charlanne Fredia CROME, MD   Brief Narrative: 89 year old with past medical history significant for Alzheimer dementia, diastolic heart failure, hyperlipidemia, arthritis, history osteoporosis, recent diagnosis of ILD refused treatment and no further workup per notes, hypertension presents from [facility] with weakness altered mental status found to be hypoxic.  Patient admitted with pneumonia.  By CT chest, acute on chronic diastolic heart failure exacerbation.  Discharge cancelled 1/11. Patient required Rehab evaluation.      Assessment & Plan:   Principal Problem:   Hypoxia Active Problems:   Acute heart failure with preserved ejection fraction (HCC)   Primary hypertension   Acute cystitis without hematuria  1-Acute hypoxic respiratory failure in the setting of Pneumonia and Acute  Diastolic heart failure exacerbation- Community Acquired PNA.  -Patient presented with hypoxia oxygen  saturation 92 on 4 L of oxygen , report  cough, leukocytosis white blood cell 13.  CT chest obtained showed posterior right upper lobe pneumonia.  Trace right and minimal left pleural effusion.  Emphysema.SABRA  BNP elevated 700 -Continue IV ceftriaxone  and Zithromax . Day 3.  -Blood Culture: no growth to date.  -Continue with Pulmicort  and Brovana  Worsening hypoxia overnight, was placed on 5 L--wean down to 3 l. BNP more elevated 1400, chest x ray with Chronic interstitial coarsening without acute cardiopulmonary findings. -will give IV lasix  times one, potassium supplement.   2-Elevation Troponin, Abnormal EKG. Some st change V 4, 5, 6. Chest pain free.  Cardiology consultation. Appreciate assistance. ECHO no wall motion abnormalities, EF normal  No further evaluation needed.   3-Acute Diastolic Heart failure exacerbation;  Patient presents with hypoxia,Elevated BNP 762, mild elevation of troponin 42--3 7 On Cozaar .  BNP  increase to 1400, cr down to 1.3 Will give one time dose of lasix .   4-Hypertension: Resume Lasix , metoprolol  On Cozaar   5-Dementia: Resume Aricept  and Namenda    6-history of pulmonary fibrosis on high-resolution CT. CT chest obtained for further evaluation, no significant progression of pulmonary fibrosis on this noncontrast CT If no improvement could consider prednisone    UTI; urine growing E coli. Continue with Ceftriaxone .  Thrombocytopenia; monitor, likely related to infection.    Advance Care Planning:   Code Status: Prior Full code, Son with discussed with other siblings    Estimated body mass index is 24.3 kg/m as calculated from the following:   Height as of this encounter: 5' 8 (1.727 m).   Weight as of this encounter: 72.5 kg.   DVT prophylaxis: Lovenox  Code Status: Full code Family Communication: Son over phone 1/12 Disposition Plan:  Status is: Observation The patient will require care spanning > 2 midnights and should be moved to inpatient because: Management of acute hypoxic respiratory failure    Consultants:  Cardiology   Procedures:  ECHO  Antimicrobials:    Subjective: She is feeling well, oxygen  increase to 5 L overnight.    Objective: Vitals:   01/23/23 0738 01/23/23 0805 01/23/23 1141 01/23/23 1321  BP:  136/74 124/75   Pulse: 85 95 (!) 102   Resp: 14 19 20    Temp:  98 F (36.7 C) 97.7 F (36.5 C)   TempSrc:  Oral Oral   SpO2:  93% 92% 90%  Weight:      Height:        Intake/Output Summary (Last 24 hours) at 01/23/2023 1337 Last data filed at 01/23/2023 0002 Gross per 24 hour  Intake 294.14 ml  Output 400  ml  Net -105.86 ml   Filed Weights   01/21/23 0459 01/22/23 0332 01/23/23 0440  Weight: 72.3 kg 71.9 kg 72.5 kg    Examination:  General exam: NAD Respiratory system: BL crackles.  Cardiovascular system: S 1, S 2 RRR Gastrointestinal system: BS present, osft nt Central nervous system: alert  Data Reviewed: I have  personally reviewed following labs and imaging studies  CBC: Recent Labs  Lab 01/18/23 0000 01/20/23 0154 01/20/23 0526 01/21/23 0259 01/22/23 0814  WBC 9.6 13.6*  --  11.7* 9.6  NEUTROABS  --  11.2*  --   --   --   HGB 15.9 15.6* 15.0 14.3 14.1  HCT 49* 48.7* 44.0 45.2 45.3  MCV  --  95.9  --  95.0 96.0  PLT 145* 128*  --  125* 126*   Basic Metabolic Panel: Recent Labs  Lab 01/18/23 0000 01/20/23 0154 01/20/23 0526 01/20/23 0843 01/21/23 0259 01/22/23 0814 01/23/23 0725  NA 141 138 142  --  139 140 143  K 4.1 3.7 3.9  --  3.8 3.4* 3.8  CL 106 104 107  --  106 103 105  CO2 23* 22  --   --  22 23 24   GLUCOSE  --  148* 137*  --  140* 176* 131*  BUN 26* 27* 28*  --  27* 32* 38*  CREATININE 1.3* 1.30* 1.20*  --  1.26* 1.45* 1.37*  CALCIUM  9.2 8.9  --   --  8.5* 8.3* 8.5*  MG  --   --   --  2.1  --   --   --    GFR: Estimated Creatinine Clearance: 25.3 mL/min (A) (by C-G formula based on SCr of 1.37 mg/dL (H)). Liver Function Tests: Recent Labs  Lab 01/18/23 0000 01/20/23 0154  AST 22 19  ALT 16 15  ALKPHOS 108 82  BILITOT  --  1.5*  PROT  --  6.8  ALBUMIN 3.8 3.4*   No results for input(s): LIPASE, AMYLASE in the last 168 hours. No results for input(s): AMMONIA in the last 168 hours. Coagulation Profile: No results for input(s): INR, PROTIME in the last 168 hours. Cardiac Enzymes: No results for input(s): CKTOTAL, CKMB, CKMBINDEX, TROPONINI in the last 168 hours. BNP (last 3 results) No results for input(s): PROBNP in the last 8760 hours. HbA1C: No results for input(s): HGBA1C in the last 72 hours. CBG: No results for input(s): GLUCAP in the last 168 hours. Lipid Profile: No results for input(s): CHOL, HDL, LDLCALC, TRIG, CHOLHDL, LDLDIRECT in the last 72 hours. Thyroid  Function Tests: No results for input(s): TSH, T4TOTAL, FREET4, T3FREE, THYROIDAB in the last 72 hours. Anemia Panel: No results for  input(s): VITAMINB12, FOLATE, FERRITIN, TIBC, IRON, RETICCTPCT in the last 72 hours. Sepsis Labs: Recent Labs  Lab 01/20/23 0251 01/20/23 0531  LATICACIDVEN 2.4* 1.2    Recent Results (from the past 240 hours)  Resp panel by RT-PCR (RSV, Flu A&B, Covid) Anterior Nasal Swab     Status: None   Collection Time: 01/20/23  3:29 AM   Specimen: Anterior Nasal Swab  Result Value Ref Range Status   SARS Coronavirus 2 by RT PCR NEGATIVE NEGATIVE Final   Influenza A by PCR NEGATIVE NEGATIVE Final   Influenza B by PCR NEGATIVE NEGATIVE Final    Comment: (NOTE) The Xpert Xpress SARS-CoV-2/FLU/RSV plus assay is intended as an aid in the diagnosis of influenza from Nasopharyngeal swab specimens and should not be used as a  sole basis for treatment. Nasal washings and aspirates are unacceptable for Xpert Xpress SARS-CoV-2/FLU/RSV testing.  Fact Sheet for Patients: bloggercourse.com  Fact Sheet for Healthcare Providers: seriousbroker.it  This test is not yet approved or cleared by the [place]  FDA and has been authorized for detection and/or diagnosis of SARS-CoV-2 by FDA under an Emergency Use Authorization (EUA). This EUA will remain in effect (meaning this test can be used) for the duration of the COVID-19 declaration under Section 564(b)(1) of the Act, 21 U.S.C. section 360bbb-3(b)(1), unless the authorization is terminated or revoked.     Resp Syncytial Virus by PCR NEGATIVE NEGATIVE Final    Comment: (NOTE) Fact Sheet for Patients: bloggercourse.com  Fact Sheet for Healthcare Providers: seriousbroker.it  This test is not yet approved or cleared by the [place]  FDA and has been authorized for detection and/or diagnosis of SARS-CoV-2 by FDA under an Emergency Use Authorization (EUA). This EUA will remain in effect (meaning this test can be used) for the duration  of the COVID-19 declaration under Section 564(b)(1) of the Act, 21 U.S.C. section 360bbb-3(b)(1), unless the authorization is terminated or revoked.  Performed at [facility], [address] [place], [place] [address]   Urine Culture     Status: Abnormal   Collection Time: 01/20/23  4:15 AM   Specimen: Urine, Random  Result Value Ref Range Status   Specimen Description URINE, RANDOM  Final   Special Requests NONE Reflexed from [id number]  Final   Culture (A)  Final    >=100,000 COLONIES/mL ESCHERICHIA COLI Two isolates with different morphologies were identified as the same organism.The most resistant organism was reported. Performed at [facility], [address] [place], [place] [address]    Report Status 01/22/2023 FINAL  Final   Organism ID, Bacteria ESCHERICHIA COLI (A)  Final      Susceptibility   Escherichia coli - MIC*    AMPICILLIN <=2 SENSITIVE Sensitive     CEFAZOLIN <=4 SENSITIVE Sensitive     CEFEPIME  <=0.12 SENSITIVE Sensitive     CEFTRIAXONE  <=0.25 SENSITIVE Sensitive     CIPROFLOXACIN <=0.25 SENSITIVE Sensitive     GENTAMICIN <=1 SENSITIVE Sensitive     IMIPENEM <=0.25 SENSITIVE Sensitive     NITROFURANTOIN 32 SENSITIVE Sensitive     TRIMETH/SULFA <=20 SENSITIVE Sensitive     AMPICILLIN/SULBACTAM <=2 SENSITIVE Sensitive     PIP/TAZO <=4 SENSITIVE Sensitive ug/mL    * >=100,000 COLONIES/mL ESCHERICHIA COLI  Culture, blood (Routine X 2) w Reflex to ID Panel     Status: None (Preliminary result)   Collection Time: 01/20/23  4:30 PM   Specimen: BLOOD LEFT ARM  Result Value Ref Range Status   Specimen Description BLOOD LEFT ARM  Final   Special Requests   Final    BOTTLES DRAWN AEROBIC ONLY Blood Culture results may not be optimal due to an inadequate volume of blood received in culture bottles   Culture   Final    NO GROWTH 3 DAYS Performed at [facility], [address], [place], [place] [address]    Report Status PENDING  Incomplete   Culture, blood (Routine X 2) w Reflex to ID Panel     Status: None (Preliminary result)   Collection Time: 01/20/23  4:31 PM   Specimen: BLOOD LEFT ARM  Result Value Ref Range Status   Specimen Description BLOOD LEFT ARM  Final   Special Requests   Final    BOTTLES DRAWN AEROBIC  ONLY Blood Culture results may not be optimal due to an inadequate volume of blood received in culture bottles   Culture   Final    NO GROWTH 3 DAYS Performed at [facility], [address], [place], [place] [address]    Report Status PENDING  Incomplete         Radiology Studies: DG Chest 2 View Result Date: 01/23/2023 CLINICAL DATA:  Hypoxia. EXAM: CHEST - 2 VIEW COMPARISON:  01/20/2023 FINDINGS: The cardio pericardial silhouette is enlarged. Interstitial markings are diffusely coarsened with chronic features. The lungs are clear without focal pneumonia, edema, pneumothorax or pleural effusion. Bones are diffusely demineralized. Telemetry leads overlie the chest. IMPRESSION: Chronic interstitial coarsening without acute cardiopulmonary findings. The right lower lobe paraspinal consolidative disease seen on previous CT is not readily evident on today's x-ray. Electronically Signed   By: Camellia Candle M.D.   On: 01/23/2023 09:09        Scheduled Meds:  arformoterol   15 mcg Nebulization BID   atorvastatin   40 mg Oral Daily   budesonide  (PULMICORT ) nebulizer solution  0.25 mg Nebulization BID   docusate sodium   100 mg Oral BID   donepezil   10 mg Oral QHS   enoxaparin  (LOVENOX ) injection  30 mg Subcutaneous Daily   [START ON 01/24/2023] furosemide   40 mg Oral Q M,W,F   guaiFENesin   600 mg Oral BID   ipratropium-albuterol   3 mL Nebulization TID   losartan   100 mg Oral Daily   memantine   10 mg Oral BID   metoprolol  tartrate  150 mg Oral BID   potassium chloride   20 mEq Oral Once   Continuous Infusions:  azithromycin  Stopped (01/22/23 1235)   cefTRIAXone  (ROCEPHIN )  IV 2 g (01/23/23 1141)      LOS: 2 days    Time spent: 35 minutes    Otha Rickles A Tyeesha Riker, MD [facility]   If 7PM-7AM, please contact night-coverage [email or web address]  01/23/2023, 1:37 PM

## 2023-01-23 NOTE — TOC Progression Note (Signed)
 Transition of Care [facility]) - Progression Note    Patient Details  Name: Kelsey Salinas MRN: [id number] Date of Birth: 12-03-1926  Transition [facility]) CM/SW Contact  Olam FORBES Ally, LCSW Phone Number: 01/23/2023, 3:07 PM  Clinical Narrative:     CSW was alerted about new SNF recs however admission staff are not available on weekends. Weekday CSW can follow up tomorrow to ascertain what is needed for SNF.  TOC team will continue to assist with discharge planning needs.    Expected Discharge Plan: Home w Home Health Services Barriers to Discharge: Equipment Delay  Expected Discharge Plan and Services In-house Referral: NA Discharge Planning Services: CM Consult Post Acute Care Choice: NA Living arrangements for the past 2 months: Independent Living Facility Expected Discharge Date: 01/22/23               DME Arranged: N/A DME Agency: NA       HH Arranged: NA           Social Determinants of Health (SDOH) Interventions SDOH Screenings   Food Insecurity: No Food Insecurity (01/20/2023)  Housing: Low Risk  (01/20/2023)  Transportation Needs: No Transportation Needs (01/20/2023)  Utilities: Not At Risk (01/20/2023)  Alcohol Screen: Low Risk  (08/03/2017)  Depression (PHQ2-9): Low Risk  (08/31/2022)  Financial Resource Strain: Low Risk  (07/19/2017)  Physical Activity: Insufficiently Active (07/19/2017)  Social Connections: Moderately Integrated (01/20/2023)  Stress: Stress Concern Present (07/19/2017)  Tobacco Use: Medium Risk (01/20/2023)    Readmission Risk Interventions     No data to display

## 2023-01-24 DIAGNOSIS — R0902 Hypoxemia: Secondary | ICD-10-CM | POA: Diagnosis not present

## 2023-01-24 LAB — BASIC METABOLIC PANEL
Anion gap: 10 (ref 5–15)
BUN: 37 mg/dL — ABNORMAL HIGH (ref 8–23)
CO2: 22 mmol/L (ref 22–32)
Calcium: 8 mg/dL — ABNORMAL LOW (ref 8.9–10.3)
Chloride: 105 mmol/L (ref 98–111)
Creatinine, Ser: 1.36 mg/dL — ABNORMAL HIGH (ref 0.44–1.00)
GFR, Estimated: 36 mL/min — ABNORMAL LOW (ref >60)
Glucose, Bld: 146 mg/dL — ABNORMAL HIGH (ref 70–99)
Potassium: 3.5 mmol/L (ref 3.5–5.1)
Sodium: 137 mmol/L (ref 135–145)

## 2023-01-24 MED ORDER — IPRATROPIUM-ALBUTEROL 0.5-2.5 (3) MG/3ML IN SOLN
3.0000 mL | Freq: Two times a day (BID) | RESPIRATORY_TRACT | Status: DC
Start: 2023-01-24 — End: 2023-01-24

## 2023-01-24 MED ORDER — POTASSIUM CHLORIDE CRYS ER 20 MEQ PO TBCR
20.0000 meq | EXTENDED_RELEASE_TABLET | Freq: Once | ORAL | Status: AC
Start: 2023-01-24 — End: 2023-01-24
  Administered 2023-01-24: 20 meq via ORAL
  Filled 2023-01-24: qty 1

## 2023-01-24 NOTE — NC FL2 (Deleted)
 [place]  MEDICAID [id number] LEVEL OF CARE FORM     IDENTIFICATION  Patient Name: Kelsey Salinas Birthdate: March 13, 1926 Sex: female Admission Date (Current Location): 01/20/2023  [facility] and [place] Number:  Producer, Television/film/video and Address:  [facility], [address], [place], [place] [address]      Provider Number: [id number]  Attending Physician Name and Address:  Madelyne Owen LABOR, MD  Relative Name and Phone Number:       Current Level of Care: Hospital Recommended Level of Care: Skilled Nursing Facility Prior Approval Number:    Date Approved/Denied:   PASRR Number: [id number]  Discharge Plan: SNF    Current Diagnoses: Patient Active Problem List   Diagnosis Date Noted   Acute cystitis without hematuria 01/21/2023   Hypoxia 01/20/2023   Paroxysmal atrial tachycardia (HCC)    Diastolic dysfunction    Acute cough    Acute heart failure with preserved ejection fraction (HCC) 09/09/2021   Abscess of right leg 12/24/2020   Cellulitis 12/10/2020   Cellulitis of right leg 12/09/2020   Memory loss, short term 12/13/2018   DDD (degenerative disc disease), cervical 12/13/2018   B12 deficiency 12/13/2018   Chronic cough 07/25/2017   Abnormal MMSE 07/25/2017   Type II diabetes mellitus with stage 3 chronic kidney disease (HCC) 05/31/2017   Sinus tachycardia    Multiple fractures of ramus of right pubis with routine healing    Closed pelvic fracture (HCC) 05/18/2017   Bilateral chronic knee pain 01/18/2017   Bilateral primary osteoarthritis of knee 04/07/2016   Hyperglycemia 10/01/2015   Fracture of 5th metatarsal 02/14/2015   CKD (chronic kidney disease) stage 3, GFR 30-59 ml/min (HCC) 12/10/2013   Sebaceous cyst 12/10/2013   Insomnia    Colon polyp    Diverticular stricture (HCC)    Osteopenia    Vitamin D  deficiency 10/31/2013   Indigestion    Primary osteoarthritis of hip    Chronic diastolic heart failure (HCC) 12/25/2012   Rapid  resting heart rate 12/25/2012   Primary hypertension 12/25/2012   Hypercholesterolemia     Orientation RESPIRATION BLADDER Height & Weight        O2 (3L O2 [place]) External catheter, Incontinent Weight: 159 lb 13.3 oz (72.5 kg) Height:  5' 8 (172.7 cm)  BEHAVIORAL SYMPTOMS/MOOD NEUROLOGICAL BOWEL NUTRITION STATUS      Continent Diet (See dc summary)  AMBULATORY STATUS COMMUNICATION OF NEEDS Skin   Extensive Assist Verbally Surgical wounds (Wound / Incision - Non-pressure wound Thigh Anterior;Right scab with black head and reddness)                       Personal Care Assistance Level of Assistance  Bathing, Feeding, Dressing Bathing Assistance: Maximum assistance Feeding assistance: Limited assistance Dressing Assistance: Maximum assistance     Functional Limitations Info  Sight, Hearing, Speech Sight Info: Impaired (Glasses) Hearing Info: Adequate Speech Info: Adequate    SPECIAL CARE FACTORS FREQUENCY  PT (By licensed PT), OT (By licensed OT)     PT Frequency: 5x week OT Frequency: 5x weel            Contractures Contractures Info: Not present    Additional Factors Info  Code Status, Allergies Code Status Info: Full Allergies Info: Amlodipine Besylate, Lisinopril, Sulfamethoxazole-trimethoprim           Current Medications (01/24/2023):  This is the current hospital active medication list Current Facility-Administered Medications  Medication Dose Route Frequency Provider Last  Rate Last Admin   acetaminophen  (TYLENOL ) tablet 650 mg  650 mg Oral Q6H PRN Regalado, Belkys A, MD       Or   acetaminophen  (TYLENOL ) suppository 650 mg  650 mg Rectal Q6H PRN Regalado, Belkys A, MD       arformoterol  (BROVANA ) nebulizer solution 15 mcg  15 mcg Nebulization BID Regalado, Belkys A, MD   15 mcg at 01/24/23 0738   atorvastatin  (LIPITOR) tablet 40 mg  40 mg Oral Daily Regalado, Belkys A, MD   40 mg at 01/24/23 0910   azithromycin  (ZITHROMAX ) 500 mg in sodium chloride   0.9 % 250 mL IVPB  500 mg Intravenous Q24H Regalado, Belkys A, MD 250 mL/hr at 01/24/23 1154 500 mg at 01/24/23 1154   bisacodyl  (DULCOLAX) EC tablet 5 mg  5 mg Oral Daily PRN Regalado, Belkys A, MD   5 mg at 01/23/23 9078   budesonide  (PULMICORT ) nebulizer solution 0.25 mg  0.25 mg Nebulization BID Regalado, Belkys A, MD   0.25 mg at 01/24/23 0738   cefTRIAXone  (ROCEPHIN ) 2 g in sodium chloride  0.9 % 100 mL IVPB  2 g Intravenous Q24H Regalado, Belkys A, MD 200 mL/hr at 01/24/23 0920 2 g at 01/24/23 0920   docusate sodium  (COLACE) capsule 100 mg  100 mg Oral BID Regalado, Belkys A, MD   100 mg at 01/23/23 2031   donepezil  (ARICEPT ) tablet 10 mg  10 mg Oral QHS Regalado, Belkys A, MD   10 mg at 01/23/23 2031   enoxaparin  (LOVENOX ) injection 30 mg  30 mg Subcutaneous Daily Regalado, Belkys A, MD   30 mg at 01/24/23 0910   furosemide  (LASIX ) tablet 40 mg  40 mg Oral Q M,W,F Raford Riggs, MD   40 mg at 01/24/23 0910   guaiFENesin  (MUCINEX ) 12 hr tablet 600 mg  600 mg Oral BID Regalado, Belkys A, MD   600 mg at 01/24/23 1155   hydrALAZINE  (APRESOLINE ) injection 10 mg  10 mg Intravenous Q6H PRN Regalado, Belkys A, MD       ipratropium-albuterol  (DUONEB) 0.5-2.5 (3) MG/3ML nebulizer solution 3 mL  3 mL Nebulization BID Regalado, Belkys A, MD       losartan  (COZAAR ) tablet 100 mg  100 mg Oral Daily Regalado, Belkys A, MD   100 mg at 01/24/23 0910   memantine  (NAMENDA ) tablet 10 mg  10 mg Oral BID Regalado, Belkys A, MD   10 mg at 01/24/23 0910   metoprolol  tartrate (LOPRESSOR ) tablet 150 mg  150 mg Oral BID Regalado, Belkys A, MD   150 mg at 01/24/23 0910   ondansetron  (ZOFRAN ) tablet 4 mg  4 mg Oral Q6H PRN Regalado, Belkys A, MD       Or   ondansetron  (ZOFRAN ) injection 4 mg  4 mg Intravenous Q6H PRN Regalado, Belkys A, MD         Discharge Medications: Please see discharge summary for a list of discharge medications.  Relevant Imaging Results:  Relevant Lab Results:   Additional  Information SS#: [id number], LCSWA

## 2023-01-24 NOTE — TOC Transition Note (Signed)
 Transition of Care [facility]) - Discharge Note   Patient Details  Name: Kelsey Salinas MRN: [id number] Date of Birth: 05/22/1926  Transition of Care [facility]) CM/SW Contact:  Luise JAYSON Pan, LCSWA Phone Number: 01/24/2023, 2:02 PM   Clinical Narrative:   Patient will DC to: [facility] Anticipated DC date: 01/24/2023 Family notified: Arland (dtr) Transport by: rome   Per MD patient ready for DC to [facility]. RN to call report prior to discharge [phone]; room 158). RN, patient, patient's family, and facility notified of DC. Discharge Summary and FL2 sent to facility. DC packet on chart. Ambulance transport requested for patient.   CSW will sign off for now as social work intervention is no longer needed. Please consult us  again if new needs arise.      Final next level of care: Skilled Nursing Facility Barriers to Discharge: Barriers Resolved   Patient Goals and CMS Choice Patient states their goals for this hospitalization and ongoing recovery are:: return to [facility]   Choice offered to / list presented to : NA      Discharge Placement              Patient chooses bed at: [facility] Patient to be transferred to facility by: [facility] Name of family member notified: Arland (dtr) Patient and family notified of of transfer: 01/24/23  Discharge Plan and Services Additional resources added to the After Visit Summary for   In-house Referral: NA Discharge Planning Services: CM Consult Post Acute Care Choice: NA          DME Arranged: N/A DME Agency: NA       HH Arranged: NA          Social Drivers of Health (SDOH) Interventions SDOH Screenings   Food Insecurity: No Food Insecurity (01/20/2023)  Housing: Low Risk  (01/20/2023)  Transportation Needs: No Transportation Needs (01/20/2023)  Utilities: Not At Risk (01/20/2023)  Alcohol Screen: Low Risk  (08/03/2017)  Depression (PHQ2-9): Low Risk  (08/31/2022)  Financial Resource Strain: Low Risk  (07/19/2017)   Physical Activity: Insufficiently Active (07/19/2017)  Social Connections: Moderately Integrated (01/20/2023)  Stress: Stress Concern Present (07/19/2017)  Tobacco Use: Medium Risk (01/20/2023)     Readmission Risk Interventions     No data to display

## 2023-01-24 NOTE — Discharge Summary (Signed)
 Physician Discharge Summary   Patient: Kelsey Salinas MRN: [id number] DOB: Feb 24, 1926  Admit date:     01/20/2023  Discharge date: 01/24/23  Discharge Physician: Owen DELENA Lore   PCP: Charlanne Fredia CROME, MD   Recommendations at discharge:    Follow up after resolution of PNA.  Needs follow up with Pulmonologist Needs Goal  of care discussion in regards Code status.  Needs Bmet to follow renal function.  Could consider prednisone  if no improvement of Resp failure.   Discharge Diagnoses: Principal Problem:   Hypoxia Active Problems:   Acute heart failure with preserved ejection fraction (HCC)   Primary hypertension   Acute cystitis without hematuria  Resolved Problems:   * No resolved hospital problems. *  Hospital Course: 89 year old with past medical history significant for Alzheimer dementia, diastolic heart failure, hyperlipidemia, arthritis, history osteoporosis, recent diagnosis of ILD refused treatment and no further workup per notes, hypertension presents from [facility] with weakness altered mental status found to be hypoxic. Patient admitted with pneumonia. By CT chest, acute on chronic diastolic heart failure exacerbation.   Assessment and Plan:  1-Acute hypoxic respiratory failure in the setting of Pneumonia and Acute  Diastolic heart failure exacerbation- Community Acquired PNA.  -Patient presented with hypoxia oxygen  saturation 92 on 4 L of oxygen , report  cough, leukocytosis white blood cell 13.  CT chest obtained showed posterior right upper lobe pneumonia.  Trace right and minimal left pleural effusion.  Emphysema.SABRA  BNP elevated 700 -Treated  IV ceftriaxone  and Zithromax . 2 days -Blood Culture: no growth to date.  -Continue with Pulmicort  and Brovana --change to Trelegy at discharge  Still requiring 3 L oxygen , she will need to be discharge on oxygen , already has some lung affection from ILD, and now with PNA Discharge on Augmentin  for 5 days, Azithro 3 more days  to complete 5 days.  Lasix  M, W, F.  Stable today, Oxygen  sat 94 on 3 L.  1.2 L urine out put on 1/12. Cr stable.    2-Elevation Troponin, Abnormal EKG. Some st change V 4, 5, 6. Chest pain free.  Cardiology consultation. Appreciate assistance. ECHO no wall motion abnormalities, EF normal    3-Acute Diastolic Heart failure exacerbation;  Patient presents with hypoxia,Elevated BNP 762, mild elevation of troponin 42--3 7 Lasix  change to M, W, F.  Continue with Cozaar   Received IV lasix  on 1/12 for increase BNP and worsening hypoxia. Now back on 3 L. Renal function stable. Requiring 3 L oxygen .   4-Hypertension: Resume Lasix , metoprolol  Resume Cozaar   5-Dementia: Resume Aricept  and Namenda    6-history of pulmonary fibrosis on high-resolution CT. CT chest obtained for further evaluation, no significant progression of pulmonary fibrosis on this noncontrast CT If no improvement could consider prednisone  Stable.  UTI; urine growing E coli. Treated with Ceftriaxone .  Discharge on Augmentin  to cover for PNA as well.  Hypokalemia; Replenish.  Thrombocytopenia; monitor, likely related to infection.         Estimated body mass index is 24.24 kg/m as calculated from the following:   Height as of this encounter: 5' 8 (1.727 m).   Weight as of this encounter: 72.3 kg.    Addendum; [facility] won accept patient because she needed two person assist for dressing. Will get PT, OT      Consultants: Cardiology  Procedures performed: ECHO Disposition: Home Diet recommendation:  Discharge Diet Orders (From admission, onward)     Start     Ordered   01/22/23 0000  Diet -  low sodium heart healthy        01/22/23 1016           Cardiac diet DISCHARGE MEDICATION: Allergies as of 01/24/2023       Reactions   Amlodipine Besylate Other (See Comments)   Edema   Lisinopril Cough   Cough   Sulfamethoxazole-trimethoprim Other (See Comments)   Unknown reaction        Medication  List     TAKE these medications    acetaminophen  500 MG tablet Commonly known as: TYLENOL  Take 500 mg by mouth daily.   albuterol  108 (90 Base) MCG/ACT inhaler Commonly known as: VENTOLIN  HFA Inhale 2 puffs into the lungs every 6 (six) hours as needed for wheezing or shortness of breath.   amoxicillin -clavulanate 875-125 MG tablet Commonly known as: AUGMENTIN  Take 1 tablet by mouth 2 (two) times daily for 5 days.   atorvastatin  40 MG tablet Commonly known as: LIPITOR TAKE 1 TABLET ONCE DAILY.   azithromycin  500 MG tablet Commonly known as: ZITHROMAX  Take 1 tablet (500 mg total) by mouth daily for 3 days.   Cholecalciferol 1.25 MG (50000 UT) capsule Take 50,000 Units by mouth daily.   cyanocobalamin  1000 MCG tablet Commonly known as: VITAMIN B12 Take 1 tablet (1,000 mcg total) by mouth daily.   donepezil  10 MG tablet Commonly known as: ARICEPT  TAKE ONE TABLET BY MOUTH AT BEDTIME   empagliflozin  10 MG Tabs tablet Commonly known as: [name]  Take 1 tablet (10 mg total) by mouth daily before breakfast.   famotidine  20 MG tablet Commonly known as: PEPCID  Take 20 mg by mouth daily.   fexofenadine  180 MG tablet Commonly known as: ALLEGRA  Take 1 tablet (180 mg total) by mouth daily.   furosemide  40 MG tablet Commonly known as: LASIX  Take 1 tablet (40 mg total) by mouth every Monday, Wednesday, and Friday. What changed:  when to take this reasons to take this   guaiFENesin  600 MG 12 hr tablet Commonly known as: MUCINEX  Take 1 tablet (600 mg total) by mouth 2 (two) times daily.   losartan  100 MG tablet Commonly known as: COZAAR  Take 100 mg by mouth daily.   memantine  10 MG tablet Commonly known as: NAMENDA  TAKE 1 TABLET BY MOUTH TWICE DAILY.   metoprolol  succinate 100 MG 24 hr tablet Commonly known as: TOPROL -XL TAKE 1 AND 1/2 TABLETS BY MOUTH IN THE MORNING AND AT BEDTIME   polyethylene glycol 17 g packet Commonly known as: MIRALAX  / GLYCOLAX  Take 17 g  by mouth daily.   potassium chloride  SA 20 MEQ tablet Commonly known as: KLOR-CON  M Take 1 tablet (20 mEq total) by mouth once as needed for up to 1 dose. Take it if takes Lasix  2/week   sodium chloride  0.65 % Soln nasal spray Commonly known as: OCEAN Place 1 spray into both nostrils as needed for congestion.   Trelegy Ellipta  200-62.5-25 MCG/ACT Aepb Generic drug: Fluticasone -Umeclidin-Vilant Inhale 1 puff into the lungs daily.   triamcinolone  55 MCG/ACT Aero nasal inhaler Commonly known as: NASACORT  Place 2 sprays into the nose daily.   Voltaren  1 % Gel Generic drug: diclofenac  Sodium Apply 2 g topically 4 (four) times daily as needed (arthritis pain).               Durable Medical Equipment  (From admission, onward)           Start     Ordered   01/22/23 0953  For home use only DME oxygen   Once  Question Answer Comment  Length of Need 6 Months   Mode or (Route) Nasal cannula   Liters per Minute 3   Frequency Continuous (stationary and portable oxygen  unit needed)   Oxygen  delivery system Gas      01/22/23 0952            Discharge Exam: Filed Weights   01/22/23 0332 01/23/23 0440 01/24/23 0500  Weight: 71.9 kg 72.5 kg 72.5 kg   General NAD.  Feeling well, denies dyspnea. Looking forward to be discharge.   Condition at discharge: stable  The results of significant diagnostics from this hospitalization (including imaging, microbiology, ancillary and laboratory) are listed below for reference.   Imaging Studies: DG Chest 2 View Result Date: 01/23/2023 CLINICAL DATA:  Hypoxia. EXAM: CHEST - 2 VIEW COMPARISON:  01/20/2023 FINDINGS: The cardio pericardial silhouette is enlarged. Interstitial markings are diffusely coarsened with chronic features. The lungs are clear without focal pneumonia, edema, pneumothorax or pleural effusion. Bones are diffusely demineralized. Telemetry leads overlie the chest. IMPRESSION: Chronic interstitial coarsening  without acute cardiopulmonary findings. The right lower lobe paraspinal consolidative disease seen on previous CT is not readily evident on today's x-ray. Electronically Signed   By: Camellia Candle M.D.   On: 01/23/2023 09:09   ECHOCARDIOGRAM COMPLETE Result Date: 01/21/2023    ECHOCARDIOGRAM REPORT   Patient Name:   Kelsey Salinas Date of Exam: 01/21/2023 Medical Rec #:  [id number]       Height:       68.0 in Accession #:    [id number]      Weight:       159.4 lb Date of Birth:  01-06-1927       BSA:          1.856 m Patient Age:    89 years        BP:           164/89 mmHg Patient Gender: F               HR:           70 bpm. Exam Location:  Inpatient Procedure: 2D Echo, Cardiac Doppler and Color Doppler Indications:    Congestive Heart Failure  History:        Patient has prior history of Echocardiogram examinations, most                 recent 09/10/2021. Arrythmias:PAC and PVC; Risk                 Factors:Hypertension, Dyslipidemia and Former Smoker.  Sonographer:    Ozell Free Referring Phys: [id number] TIFFANY [place] IMPRESSIONS  1. Left ventricular ejection fraction, by estimation, is 60 to 65%. The left ventricle has normal function. The left ventricle has no regional wall motion abnormalities. There is mild concentric left ventricular hypertrophy. Left ventricular diastolic parameters are consistent with Grade I diastolic dysfunction (impaired relaxation).  2. Peak RV-RA gradient 67 mmHg. Right ventricular systolic function is normal. The right ventricular size is normal.  3. Left atrial size was severely dilated.  4. The mitral valve is normal in structure. Trivial mitral valve regurgitation. No evidence of mitral stenosis.  5. The aortic valve is tricuspid. Aortic valve regurgitation is trivial. No aortic stenosis is present.  6. The IVC was not well-visualized. FINDINGS  Left Ventricle: Left ventricular ejection fraction, by estimation, is 60 to 65%. The left ventricle has normal function. The left  ventricle has no regional wall motion abnormalities. The left  ventricular internal cavity size was normal in size. There is  mild concentric left ventricular hypertrophy. Left ventricular diastolic parameters are consistent with Grade I diastolic dysfunction (impaired relaxation). Right Ventricle: Peak RV-RA gradient 67 mmHg. The right ventricular size is normal. No increase in right ventricular wall thickness. Right ventricular systolic function is normal. Left Atrium: Left atrial size was severely dilated. Right Atrium: Right atrial size was normal in size. Pericardium: There is no evidence of pericardial effusion. Mitral Valve: The mitral valve is normal in structure. Trivial mitral valve regurgitation. No evidence of mitral valve stenosis. Tricuspid Valve: The tricuspid valve is normal in structure. Tricuspid valve regurgitation is mild. Aortic Valve: The aortic valve is tricuspid. Aortic valve regurgitation is trivial. Aortic regurgitation PHT measures 449 msec. No aortic stenosis is present. Aortic valve mean gradient measures 3.0 mmHg. Aortic valve peak gradient measures 6.1 mmHg. Aortic valve area, by VTI measures 2.60 cm. Pulmonic Valve: The pulmonic valve was normal in structure. Pulmonic valve regurgitation is trivial. Aorta: The aortic root is normal in size and structure. Venous: The IVC was not well-visualized. The inferior vena cava was not well visualized. IAS/Shunts: No atrial level shunt detected by color flow Doppler.  LEFT VENTRICLE PLAX 2D LVIDd:         4.40 cm   Diastology LVIDs:         2.60 cm   LV e' medial:    2.94 cm/s LV PW:         1.00 cm   LV E/e' medial:  21.2 LV IVS:        1.30 cm   LV e' lateral:   6.09 cm/s LVOT diam:     2.10 cm   LV E/e' lateral: 10.2 LV SV:         51 LV SV Index:   27 LVOT Area:     3.46 cm  RIGHT VENTRICLE RV Basal diam:  3.70 cm RV S prime:     14.00 cm/s TAPSE (M-mode): 2.3 cm LEFT ATRIUM             Index        RIGHT ATRIUM           Index LA diam:         4.70 cm 2.53 cm/m   RA Area:     16.90 cm LA Vol (A2C):   93.1 ml 50.17 ml/m  RA Volume:   42.80 ml  23.06 ml/m LA Vol (A4C):   97.5 ml 52.54 ml/m LA Biplane Vol: 96.2 ml 51.84 ml/m  AORTIC VALVE AV Area (Vmax):    2.61 cm AV Area (Vmean):   2.75 cm AV Area (VTI):     2.60 cm AV Vmax:           123.00 cm/s AV Vmean:          83.800 cm/s AV VTI:            0.196 m AV Peak Grad:      6.1 mmHg AV Mean Grad:      3.0 mmHg LVOT Vmax:         92.80 cm/s LVOT Vmean:        66.500 cm/s LVOT VTI:          0.147 m LVOT/AV VTI ratio: 0.75 AI PHT:            449 msec  AORTA Ao Root diam: 3.70 cm Ao Asc diam:  3.00 cm MITRAL VALVE  TRICUSPID VALVE MV Area (PHT): 4.19 cm    TR Peak grad:   66.9 mmHg MV Decel Time: 181 msec    TR Vmax:        409.00 cm/s MV E velocity: 62.20 cm/s MV A velocity: 81.20 cm/s  SHUNTS MV E/A ratio:  0.77        Systemic VTI:  0.15 m                            Systemic Diam: 2.10 cm Dalton McleanMD Electronically signed by Ezra Kanner Signature Date/Time: 01/21/2023/9:44:13 AM    Final    CT CHEST WO CONTRAST Result Date: 01/20/2023 CLINICAL DATA:  Chronic shortness of breath. EXAM: CT CHEST WITHOUT CONTRAST TECHNIQUE: Multidetector CT imaging of the chest was performed following the standard protocol without IV contrast. RADIATION DOSE REDUCTION: This exam was performed according to the departmental dose-optimization program which includes automated exposure control, adjustment of the mA and/or kV according to patient size and/or use of iterative reconstruction technique. COMPARISON:  Chest x-ray from same day. High-resolution chest CT dated October 08, 2021. FINDINGS: Cardiovascular: Chronic cardiomegaly. Similar trace pericardial fluid. No thoracic aortic aneurysm. Coronary, aortic arch, and branch vessel atherosclerotic vascular disease. Mediastinum/Nodes: No pathologically enlarged mediastinal or axillary lymph nodes. Prominent right paratracheal lymph node is  likely reactive. Mildly patulous esophagus. Thyroid  gland and trachea demonstrate no significant findings. Lungs/Pleura: Moderate emphysema. Trace right and minimal left pleural effusions. New consolidation in the posterior right upper lobe. Subsegmental atelectasis in the medial right lower lobe. No pneumothorax. Upper Abdomen: No acute abnormality. Musculoskeletal: No acute or significant osseous findings. IMPRESSION: 1. Posterior right upper lobe pneumonia. 2. Trace right and minimal left pleural effusions. 3. Aortic Atherosclerosis (ICD10-I70.0) and Emphysema (ICD10-J43.9). Electronically Signed   By: Elsie ONEIDA Shoulder M.D.   On: 01/20/2023 09:52   DG Chest 2 View Result Date: 01/20/2023 CLINICAL DATA:  10031.  Coughing, weakness and altered mentation. EXAM: CHEST - 2 VIEW COMPARISON:  HRCT 10/08/2021 FINDINGS: The lungs are emphysematous but clear with slightly elevated right hemidiaphragm. Mild cardiomegaly without overt CHF. Tortuous aorta with patchy calcifications and stable mediastinum. No pleural effusion is seen. Osteopenia. There is chronic rotator cuff arthropathy with glenohumeral DJD, degenerative change of the spine. IMPRESSION: 1. Emphysema without evidence of acute chest disease. Stable COPD chest. 2. Mild cardiomegaly without overt CHF. 3. Aortic atherosclerosis and uncoiling. Electronically Signed   By: Francis Quam M.D.   On: 01/20/2023 01:35    Microbiology: Results for orders placed or performed during the hospital encounter of 01/20/23  Resp panel by RT-PCR (RSV, Flu A&B, Covid) Anterior Nasal Swab     Status: None   Collection Time: 01/20/23  3:29 AM   Specimen: Anterior Nasal Swab  Result Value Ref Range Status   SARS Coronavirus 2 by RT PCR NEGATIVE NEGATIVE Final   Influenza A by PCR NEGATIVE NEGATIVE Final   Influenza B by PCR NEGATIVE NEGATIVE Final    Comment: (NOTE) The Xpert Xpress SARS-CoV-2/FLU/RSV plus assay is intended as an aid in the diagnosis of influenza from  Nasopharyngeal swab specimens and should not be used as a sole basis for treatment. Nasal washings and aspirates are unacceptable for Xpert Xpress SARS-CoV-2/FLU/RSV testing.  Fact Sheet for Patients: bloggercourse.com  Fact Sheet for Healthcare Providers: seriousbroker.it  This test is not yet approved or cleared by the [place]  FDA and has been authorized for detection and/or diagnosis of  SARS-CoV-2 by FDA under an Emergency Use Authorization (EUA). This EUA will remain in effect (meaning this test can be used) for the duration of the COVID-19 declaration under Section 564(b)(1) of the Act, 21 U.S.C. section 360bbb-3(b)(1), unless the authorization is terminated or revoked.     Resp Syncytial Virus by PCR NEGATIVE NEGATIVE Final    Comment: (NOTE) Fact Sheet for Patients: bloggercourse.com  Fact Sheet for Healthcare Providers: seriousbroker.it  This test is not yet approved or cleared by the [place]  FDA and has been authorized for detection and/or diagnosis of SARS-CoV-2 by FDA under an Emergency Use Authorization (EUA). This EUA will remain in effect (meaning this test can be used) for the duration of the COVID-19 declaration under Section 564(b)(1) of the Act, 21 U.S.C. section 360bbb-3(b)(1), unless the authorization is terminated or revoked.  Performed at [facility], [address] [place], [place] [address]   Urine Culture     Status: Abnormal   Collection Time: 01/20/23  4:15 AM   Specimen: Urine, Random  Result Value Ref Range Status   Specimen Description URINE, RANDOM  Final   Special Requests NONE Reflexed from [id number]  Final   Culture (A)  Final    >=100,000 COLONIES/mL ESCHERICHIA COLI Two isolates with different morphologies were identified as the same organism.The most resistant organism was reported. Performed at [facility],  [address], [place], [place] [address]    Report Status 01/22/2023 FINAL  Final   Organism ID, Bacteria ESCHERICHIA COLI (A)  Final      Susceptibility   Escherichia coli - MIC*    AMPICILLIN <=2 SENSITIVE Sensitive     CEFAZOLIN <=4 SENSITIVE Sensitive     CEFEPIME  <=0.12 SENSITIVE Sensitive     CEFTRIAXONE  <=0.25 SENSITIVE Sensitive     CIPROFLOXACIN <=0.25 SENSITIVE Sensitive     GENTAMICIN <=1 SENSITIVE Sensitive     IMIPENEM <=0.25 SENSITIVE Sensitive     NITROFURANTOIN 32 SENSITIVE Sensitive     TRIMETH/SULFA <=20 SENSITIVE Sensitive     AMPICILLIN/SULBACTAM <=2 SENSITIVE Sensitive     PIP/TAZO <=4 SENSITIVE Sensitive ug/mL    * >=100,000 COLONIES/mL ESCHERICHIA COLI  Culture, blood (Routine X 2) w Reflex to ID Panel     Status: None (Preliminary result)   Collection Time: 01/20/23  4:30 PM   Specimen: BLOOD LEFT ARM  Result Value Ref Range Status   Specimen Description BLOOD LEFT ARM  Final   Special Requests   Final    BOTTLES DRAWN AEROBIC ONLY Blood Culture results may not be optimal due to an inadequate volume of blood received in culture bottles   Culture   Final    NO GROWTH 4 DAYS Performed at [facility], [address], [place], [place] [address]    Report Status PENDING  Incomplete  Culture, blood (Routine X 2) w Reflex to ID Panel     Status: None (Preliminary result)   Collection Time: 01/20/23  4:31 PM   Specimen: BLOOD LEFT ARM  Result Value Ref Range Status   Specimen Description BLOOD LEFT ARM  Final   Special Requests   Final    BOTTLES DRAWN AEROBIC ONLY Blood Culture results may not be optimal due to an inadequate volume of blood received in culture bottles   Culture   Final    NO GROWTH 4 DAYS Performed at [facility], [address], [place], [place] [address]    Report Status PENDING  Incomplete    Labs: CBC: Recent Labs  Lab 01/18/23 0000 01/20/23 0154 01/20/23 0526 01/21/23 0259 01/22/23 0814  WBC 9.6 13.6*  --  11.7*  9.6  NEUTROABS  --  11.2*  --   --   --   HGB 15.9 15.6* 15.0 14.3 14.1  HCT 49* 48.7* 44.0 45.2 45.3  MCV  --  95.9  --  95.0 96.0  PLT 145* 128*  --  125* 126*   Basic Metabolic Panel: Recent Labs  Lab 01/20/23 0154 01/20/23 0526 01/20/23 0843 01/21/23 0259 01/22/23 0814 01/23/23 0725 01/24/23 0245  NA 138 142  --  139 140 143 137  K 3.7 3.9  --  3.8 3.4* 3.8 3.5  CL 104 107  --  106 103 105 105  CO2 22  --   --  22 23 24 22   GLUCOSE 148* 137*  --  140* 176* 131* 146*  BUN 27* 28*  --  27* 32* 38* 37*  CREATININE 1.30* 1.20*  --  1.26* 1.45* 1.37* 1.36*  CALCIUM  8.9  --   --  8.5* 8.3* 8.5* 8.0*  MG  --   --  2.1  --   --   --   --    Liver Function Tests: Recent Labs  Lab 01/18/23 0000 01/20/23 0154  AST 22 19  ALT 16 15  ALKPHOS 108 82  BILITOT  --  1.5*  PROT  --  6.8  ALBUMIN 3.8 3.4*   CBG: No results for input(s): GLUCAP in the last 168 hours.  Discharge time spent: greater than 30 minutes.  Signed: Owen DELENA Lore, MD [facility] 01/24/2023

## 2023-01-24 NOTE — [place] FL2 (Signed)
 [place]  MEDICAID FL2 LEVEL OF CARE FORM     IDENTIFICATION  Patient Name: Kelsey Salinas Birthdate: 26-Apr-1926 Sex: female Admission Date (Current Location): 01/20/2023  [facility] and [place] Number:  Producer, Television/film/video and Address:  [facility] [place], [address] [place], [place] [address]      Provider Number: [id number]  Attending Physician Name and Address:  Madelyne Owen LABOR, MD  Relative Name and Phone Number:       Current Level of Care: Hospital Recommended Level of Care: Skilled Nursing Facility Prior Approval Number:    Date Approved/Denied:   PASRR Number: [id number]  Discharge Plan: SNF    Current Diagnoses: Patient Active Problem List   Diagnosis Date Noted   Acute cystitis without hematuria 01/21/2023   Hypoxia 01/20/2023   Paroxysmal atrial tachycardia (HCC)    Diastolic dysfunction    Acute cough    Acute heart failure with preserved ejection fraction (HCC) 09/09/2021   Abscess of right leg 12/24/2020   Cellulitis 12/10/2020   Cellulitis of right leg 12/09/2020   Memory loss, short term 12/13/2018   DDD (degenerative disc disease), cervical 12/13/2018   B12 deficiency 12/13/2018   Chronic cough 07/25/2017   Abnormal MMSE 07/25/2017   Type II diabetes mellitus with stage 3 chronic kidney disease (HCC) 05/31/2017   Sinus tachycardia    Multiple fractures of ramus of right pubis with routine healing    Closed pelvic fracture (HCC) 05/18/2017   Bilateral chronic knee pain 01/18/2017   Bilateral primary osteoarthritis of knee 04/07/2016   Hyperglycemia 10/01/2015   Fracture of 5th metatarsal 02/14/2015   CKD (chronic kidney disease) stage 3, GFR 30-59 ml/min (HCC) 12/10/2013   Sebaceous cyst 12/10/2013   Insomnia    Colon polyp    Diverticular stricture (HCC)    Osteopenia    Vitamin D  deficiency 10/31/2013   Indigestion    Primary osteoarthritis of hip    Chronic diastolic heart failure (HCC) 12/25/2012   Rapid  resting heart rate 12/25/2012   Primary hypertension 12/25/2012   Hypercholesterolemia     Orientation RESPIRATION BLADDER Height & Weight     Self, Place  O2 (3L O2 [place]) External catheter, Incontinent Weight: 159 lb 13.3 oz (72.5 kg) Height:  5' 8 (172.7 cm)  BEHAVIORAL SYMPTOMS/MOOD NEUROLOGICAL BOWEL NUTRITION STATUS      Continent Diet (See dc summary)  AMBULATORY STATUS COMMUNICATION OF NEEDS Skin   Extensive Assist Verbally Surgical wounds (Wound / Incision - Non-pressure wound Thigh Anterior;Right scab with black head and reddness)                       Personal Care Assistance Level of Assistance  Bathing, Feeding, Dressing Bathing Assistance: Maximum assistance Feeding assistance: Limited assistance Dressing Assistance: Maximum assistance     Functional Limitations Info  Sight, Hearing, Speech Sight Info: Impaired (Glasses) Hearing Info: Adequate Speech Info: Adequate    SPECIAL CARE FACTORS FREQUENCY  PT (By licensed PT), OT (By licensed OT)     PT Frequency: 5x week OT Frequency: 5x weel            Contractures Contractures Info: Not present    Additional Factors Info  Code Status, Allergies Code Status Info: Full Allergies Info: Amlodipine Besylate, Lisinopril, Sulfamethoxazole-trimethoprim           Current Medications (01/24/2023):  This is the current hospital active medication list Current Facility-Administered Medications  Medication Dose Route Frequency Provider Last  Rate Last Admin   acetaminophen  (TYLENOL ) tablet 650 mg  650 mg Oral Q6H PRN Regalado, Belkys A, MD       Or   acetaminophen  (TYLENOL ) suppository 650 mg  650 mg Rectal Q6H PRN Regalado, Belkys A, MD       arformoterol  (BROVANA ) nebulizer solution 15 mcg  15 mcg Nebulization BID Regalado, Belkys A, MD   15 mcg at 01/24/23 0738   atorvastatin  (LIPITOR) tablet 40 mg  40 mg Oral Daily Regalado, Belkys A, MD   40 mg at 01/24/23 0910   azithromycin  (ZITHROMAX ) 500 mg in sodium  chloride 0.9 % 250 mL IVPB  500 mg Intravenous Q24H Regalado, Belkys A, MD 250 mL/hr at 01/24/23 1154 500 mg at 01/24/23 1154   bisacodyl  (DULCOLAX) EC tablet 5 mg  5 mg Oral Daily PRN Regalado, Belkys A, MD   5 mg at 01/23/23 9078   budesonide  (PULMICORT ) nebulizer solution 0.25 mg  0.25 mg Nebulization BID Regalado, Belkys A, MD   0.25 mg at 01/24/23 0738   cefTRIAXone  (ROCEPHIN ) 2 g in sodium chloride  0.9 % 100 mL IVPB  2 g Intravenous Q24H Regalado, Belkys A, MD 200 mL/hr at 01/24/23 0920 2 g at 01/24/23 0920   docusate sodium  (COLACE) capsule 100 mg  100 mg Oral BID Regalado, Belkys A, MD   100 mg at 01/23/23 2031   donepezil  (ARICEPT ) tablet 10 mg  10 mg Oral QHS Regalado, Belkys A, MD   10 mg at 01/23/23 2031   enoxaparin  (LOVENOX ) injection 30 mg  30 mg Subcutaneous Daily Regalado, Belkys A, MD   30 mg at 01/24/23 0910   furosemide  (LASIX ) tablet 40 mg  40 mg Oral Q M,W,F Raford Riggs, MD   40 mg at 01/24/23 0910   guaiFENesin  (MUCINEX ) 12 hr tablet 600 mg  600 mg Oral BID Regalado, Belkys A, MD   600 mg at 01/24/23 1155   hydrALAZINE  (APRESOLINE ) injection 10 mg  10 mg Intravenous Q6H PRN Regalado, Belkys A, MD       ipratropium-albuterol  (DUONEB) 0.5-2.5 (3) MG/3ML nebulizer solution 3 mL  3 mL Nebulization BID Regalado, Belkys A, MD       losartan  (COZAAR ) tablet 100 mg  100 mg Oral Daily Regalado, Belkys A, MD   100 mg at 01/24/23 0910   memantine  (NAMENDA ) tablet 10 mg  10 mg Oral BID Regalado, Belkys A, MD   10 mg at 01/24/23 0910   metoprolol  tartrate (LOPRESSOR ) tablet 150 mg  150 mg Oral BID Regalado, Belkys A, MD   150 mg at 01/24/23 0910   ondansetron  (ZOFRAN ) tablet 4 mg  4 mg Oral Q6H PRN Regalado, Belkys A, MD       Or   ondansetron  (ZOFRAN ) injection 4 mg  4 mg Intravenous Q6H PRN Regalado, Belkys A, MD         Discharge Medications: Please see discharge summary for a list of discharge medications.  Relevant Imaging Results:  Relevant Lab Results:   Additional  Information SS#: [id number] [place]

## 2023-01-25 ENCOUNTER — Non-Acute Institutional Stay (SKILLED_NURSING_FACILITY): Payer: Self-pay | Admitting: Orthopedic Surgery

## 2023-01-25 ENCOUNTER — Encounter: Payer: Self-pay | Admitting: Orthopedic Surgery

## 2023-01-25 DIAGNOSIS — J17 Pneumonia in diseases classified elsewhere: Secondary | ICD-10-CM | POA: Diagnosis not present

## 2023-01-25 DIAGNOSIS — I10 Essential (primary) hypertension: Secondary | ICD-10-CM | POA: Diagnosis not present

## 2023-01-25 DIAGNOSIS — Z8709 Personal history of other diseases of the respiratory system: Secondary | ICD-10-CM | POA: Diagnosis not present

## 2023-01-25 DIAGNOSIS — I509 Heart failure, unspecified: Secondary | ICD-10-CM

## 2023-01-25 DIAGNOSIS — F03B0 Unspecified dementia, moderate, without behavioral disturbance, psychotic disturbance, mood disturbance, and anxiety: Secondary | ICD-10-CM

## 2023-01-25 DIAGNOSIS — M6259 Muscle wasting and atrophy, not elsewhere classified, multiple sites: Secondary | ICD-10-CM | POA: Diagnosis not present

## 2023-01-25 DIAGNOSIS — R2689 Other abnormalities of gait and mobility: Secondary | ICD-10-CM | POA: Diagnosis not present

## 2023-01-25 DIAGNOSIS — I82402 Acute embolism and thrombosis of unspecified deep veins of left lower extremity: Secondary | ICD-10-CM | POA: Diagnosis not present

## 2023-01-25 DIAGNOSIS — M6389 Disorders of muscle in diseases classified elsewhere, multiple sites: Secondary | ICD-10-CM | POA: Diagnosis not present

## 2023-01-25 DIAGNOSIS — N3000 Acute cystitis without hematuria: Secondary | ICD-10-CM

## 2023-01-25 DIAGNOSIS — R601 Generalized edema: Secondary | ICD-10-CM | POA: Diagnosis not present

## 2023-01-25 DIAGNOSIS — M62562 Muscle wasting and atrophy, not elsewhere classified, left lower leg: Secondary | ICD-10-CM | POA: Diagnosis not present

## 2023-01-25 DIAGNOSIS — J189 Pneumonia, unspecified organism: Secondary | ICD-10-CM

## 2023-01-25 DIAGNOSIS — R278 Other lack of coordination: Secondary | ICD-10-CM | POA: Diagnosis not present

## 2023-01-25 DIAGNOSIS — M62561 Muscle wasting and atrophy, not elsewhere classified, right lower leg: Secondary | ICD-10-CM | POA: Diagnosis not present

## 2023-01-25 DIAGNOSIS — R293 Abnormal posture: Secondary | ICD-10-CM | POA: Diagnosis not present

## 2023-01-25 DIAGNOSIS — R531 Weakness: Secondary | ICD-10-CM | POA: Diagnosis not present

## 2023-01-25 DIAGNOSIS — J9601 Acute respiratory failure with hypoxia: Secondary | ICD-10-CM | POA: Diagnosis not present

## 2023-01-25 DIAGNOSIS — Z87440 Personal history of urinary (tract) infections: Secondary | ICD-10-CM | POA: Diagnosis not present

## 2023-01-25 LAB — CULTURE, BLOOD (ROUTINE X 2)
Culture: NO GROWTH
Culture: NO GROWTH

## 2023-01-25 NOTE — Progress Notes (Signed)
 Location:  [facility] Room Number: 158/A Place of Service:  SNF [phone]) Provider:  Greig FORBES Cluster, NP   Charlanne Fredia CROME, MD  Patient Care Team: Charlanne Fredia CROME, MD as PCP - General (Internal Medicine) Lonni Slain, MD as PCP - Cardiology (Cardiology) Melodi Lerner, MD as Consulting Physician (Orthopedic Surgery) Claudene Victory ORN, MD (Inactive) as Consulting Physician (Cardiology) Cleatus Collar, MD as Consulting Physician (Ophthalmology) Jane Charleston, MD as Consulting Physician (Orthopedic Surgery)  Extended Emergency Contact Information Primary Emergency Contact: [name]  [place] Home Phone: [phone] Mobile Phone: [phone] Relation: Daughter Secondary Emergency Contact: Siegmann,Arlene Home Phone: [phone] Mobile Phone: [phone] Relation: Niece  Code Status:  Full code Goals of care: Advanced Directive information    01/20/2023   12:24 AM  Advanced Directives  Does Patient Have a Medical Advance Directive? No  Would patient like information on creating a medical advance directive? No - Patient declined     Chief Complaint  Patient presents with   Hospitalization Follow-up    HPI:  Pt is a 89 y.o. female seen today for f/u s/p hospitalization [facility] Fall 01/09-01/13.   She currently resides on the rehabilitation unit at [facility]. PMH: CHF, tachycardia, HTN, HLD, T2DM, Alzheimer's dementia, OA, osteopenia, DDD, CKD, memory loss and vitamin D  deficiency.   01/09 she presented to the ED due to AMS and weakness. She was found to be hypoxic and required 4 liters oxygen  for O2 sat > 92%. WBC 13, hgb 15, platelets 128, Na+ 142, K 3.9 creatinine 1.2. Influenza panel negative. Covid negative. CXR revealed emphysema without acute disease, cardiomegaly without CHF. CT chest revealed posterior right upper lobe pneumonia and trace right and minimal left pleural effusion.  She was given IV ceftriaxone ,  Zithromax , Pulmicort  and Brovana . Discharged with Trelegy, Augmentin  and Azithromycin . BNP was 762. Cardiology consulted. Troponin was mildly elevated. EKG noted some V4/V5/V6 changes. She did not have chest pain during encounter. Echo 01/21/2023 with LVEF 60-65%. She received IV lasix  for elevated BNP and worsening hypoxia. Discharged with furosemide  M/W/F. Urine culture positive for E.coli, she was given ceftriaxone .   Today, she is upset she is in rehab. O2 sats > 90% on 3 liters oxygen . She denies chest pain or shortness of breath. She has not been out of bed. Awaiting PT evaluation this morning. She ate 50% of breakfast. She was upset when she could not answer questions and would raise her voice. Afebrile. Vitals stable.   Past Medical History:  Diagnosis Date   Abscess of right leg 12/24/2020   Childhood asthma    Chronic diastolic heart failure (HCC) 12/25/2012   October 2013 echocardiogram 1. There is moderate concentric left ventricle hypertrophy. 2. Left ventricular ejection fraction estimated by 2D at 60-65 percent. 3. There were no regional wall motion abnormalities. 4. Mild mitral annular calcification. 5. Mild mitral valve regurgitation. 6. There is mild tricuspid regurgitation. 7. Mildly elevated estimated right ventricular systolic pressure. 8. Right ventricular systolic pressure estimated at 35-40 mm Hg. 9. Mild calcification of the aortic valve. 10. Trace aortic valve regurgitation. 11. Analysis of mitral valve inflow, pulmonary vein Doppler and tissue Doppler suggests grade I diastolic dysfunction without elevated left atrial pressure.    Colon polyp    Diverticular stricture (HCC) 2009   Diverticulosis 10/30/2007   DM (diabetes mellitus), type 2 (HCC)    Edema 09/12/2013   Fall 05/18/2017   tripped over wheel of walker; superior and inferior pubic ramus fractures  Hypercholesterolemia    Hypertension    Hypertension, essential    Hypothyroidism    as a teenager    Indigestion    Insomnia    OA (osteoarthritis) of hip    right hip replacement, OA in bilateral knees   Osteopenia    Actonel stopped in 2012 after 5years therapy   Pneumonia 1939   hospitalized   Rapid resting heart rate 12/25/2012   Uncertain cause. Possible ectopic atrial tachycardia. Date entered 12/25/12    Right rotator cuff tear    Seasonal allergies    Past Surgical History:  Procedure Laterality Date   CATARACT EXTRACTION W/ INTRAOCULAR LENS  IMPLANT, BILATERAL Bilateral    COLONOSCOPY  2009   Dr. Newell   DILATION AND CURETTAGE OF UTERUS     FRACTURE SURGERY     JOINT REPLACEMENT     ORIF WRIST FRACTURE Right    REDUCTION MAMMAPLASTY  1999   TOTAL HIP ARTHROPLASTY Right 03-04-08   Dr. Melodi    Allergies  Allergen Reactions   Amlodipine Besylate Other (See Comments)    Edema   Lisinopril Cough    Cough    Sulfamethoxazole-Trimethoprim Other (See Comments)    Unknown reaction     Outpatient Encounter Medications as of 01/25/2023  Medication Sig   acetaminophen  (TYLENOL ) 500 MG tablet Take 500 mg by mouth daily.   albuterol  (VENTOLIN  HFA) 108 (90 Base) MCG/ACT inhaler Inhale 2 puffs into the lungs every 6 (six) hours as needed for wheezing or shortness of breath.   amoxicillin -clavulanate (AUGMENTIN ) 875-125 MG tablet Take 1 tablet by mouth 2 (two) times daily for 5 days.   atorvastatin  (LIPITOR) 40 MG tablet TAKE 1 TABLET ONCE DAILY.   azithromycin  (ZITHROMAX ) 500 MG tablet Take 1 tablet (500 mg total) by mouth daily for 3 days.   Cholecalciferol 1.25 MG (50000 UT) capsule Take 50,000 Units by mouth daily.   diclofenac  Sodium (VOLTAREN ) 1 % GEL Apply 2 g topically 4 (four) times daily as needed (arthritis pain).   donepezil  (ARICEPT ) 10 MG tablet TAKE ONE TABLET BY MOUTH AT BEDTIME   empagliflozin  ([name] ) 10 MG TABS tablet Take 1 tablet (10 mg total) by mouth daily before breakfast.   famotidine  (PEPCID ) 20 MG tablet Take 20 mg by mouth daily.    fexofenadine  (ALLEGRA ) 180 MG tablet Take 1 tablet (180 mg total) by mouth daily.   Fluticasone -Umeclidin-Vilant (TRELEGY ELLIPTA ) 200-62.5-25 MCG/ACT AEPB Inhale 1 puff into the lungs daily.   furosemide  (LASIX ) 40 MG tablet Take 1 tablet (40 mg total) by mouth every Monday, Wednesday, and Friday.   guaiFENesin  (MUCINEX ) 600 MG 12 hr tablet Take 1 tablet (600 mg total) by mouth 2 (two) times daily.   losartan  (COZAAR ) 100 MG tablet Take 100 mg by mouth daily.   memantine  (NAMENDA ) 10 MG tablet TAKE 1 TABLET BY MOUTH TWICE DAILY.   metoprolol  succinate (TOPROL -XL) 100 MG 24 hr tablet TAKE 1 AND 1/2 TABLETS BY MOUTH IN THE MORNING AND AT BEDTIME   polyethylene glycol (MIRALAX  / GLYCOLAX ) 17 g packet Take 17 g by mouth daily.   potassium chloride  SA (KLOR-CON  M) 20 MEQ tablet Take 1 tablet (20 mEq total) by mouth once as needed for up to 1 dose. Take it if takes Lasix  2/week   sodium chloride  (OCEAN) 0.65 % SOLN nasal spray Place 1 spray into both nostrils as needed for congestion.   triamcinolone  (NASACORT ) 55 MCG/ACT AERO nasal inhaler Place 2 sprays into the nose daily.  vitamin B-12 (CYANOCOBALAMIN ) 1000 MCG tablet Take 1 tablet (1,000 mcg total) by mouth daily.   No facility-administered encounter medications on file as of 01/25/2023.    Review of Systems  Unable to perform ROS: Dementia    Immunization History  Administered Date(s) Administered   Fluad  Quad(high Dose 65+) 10/13/2021   Fluad  Trivalent(High Dose 65+) 10/14/2022   Influenza, High Dose Seasonal PF 10/20/2018, 11/09/2019   Influenza,inj,Quad PF,6+ Mos 11/04/2017   Influenza-Unspecified 10/25/2013, 10/31/2014, 11/06/2015, 11/01/2016, 10/31/2020   Moderna Covid-19 Vaccine  Bivalent Booster 24yrs & up 10/24/2020   Moderna SARS-COV2 Booster Vaccination 11/27/2019   Moderna Sars-Covid-2 Vaccination 01/23/2019, 02/20/2019, 06/15/2021, 11/11/2021   Pneumococcal Conjugate-13 06/04/2014   Pneumococcal Polysaccharide-23 01/12/2000    Tdap 07/19/2017   Zoster Recombinant(Shingrix) 05/21/2021, 09/16/2021   Pertinent  Health Maintenance Due  Topic Date Due   OPHTHALMOLOGY EXAM  07/11/2020   FOOT EXAM  05/04/2023   HEMOGLOBIN A1C  07/18/2023   INFLUENZA VACCINE  Completed   DEXA SCAN  Completed      09/10/2021    8:00 PM 12/29/2021    9:22 AM 05/04/2022   10:24 AM 05/14/2022   12:59 PM 08/31/2022   10:42 AM  Fall Risk  Falls in the past year?  0 0 0 0  Was there an injury with Fall?  0 0 0 0  Fall Risk Category Calculator  0 0 0 0  Fall Risk Category (Retired)  Low     (RETIRED) Patient Fall Risk Level High fall risk Low fall risk     Patient at Risk for Falls Due to  No Fall Risks Impaired balance/gait;Impaired mobility Impaired balance/gait;Impaired mobility No Fall Risks  Fall risk Follow up  Falls evaluation completed Falls evaluation completed  Falls evaluation completed   Functional Status Survey:    Vitals:   01/25/23 1455  BP: (!) 149/97  Pulse: 83  Resp: 16  Temp: (!) 97.3 F (36.3 C)  SpO2: 92%  Weight: 164 lb 6.4 oz (74.6 kg)  Height: 5' 8 (1.727 m)   Body mass index is 25 kg/m. Physical Exam Vitals reviewed.  Constitutional:      General: She is not in acute distress.    Appearance: She is not ill-appearing.  HENT:     Head: Normocephalic.  Eyes:     General:        Right eye: No discharge.        Left eye: No discharge.  Cardiovascular:     Rate and Rhythm: Normal rate and regular rhythm.     Pulses: Normal pulses.     Heart sounds: Normal heart sounds.  Pulmonary:     Effort: Pulmonary effort is normal. No respiratory distress.     Breath sounds: Normal breath sounds. No wheezing or rales.  Abdominal:     General: Bowel sounds are normal.     Palpations: Abdomen is soft.  Musculoskeletal:     Cervical back: Neck supple.     Right lower leg: No edema.     Left lower leg: No edema.  Skin:    General: Skin is warm.     Capillary Refill: Capillary refill takes less than 2  seconds.  Neurological:     General: No focal deficit present.     Mental Status: She is alert. Mental status is at baseline.     Motor: Weakness present.     Gait: Gait abnormal.  Psychiatric:        Mood and Affect: Mood  normal.     Comments: Alert to self/familiar face/place, follows commands, able to express needs     Labs reviewed: Recent Labs    01/20/23 0843 01/21/23 0259 01/22/23 0814 01/23/23 0725 01/24/23 0245  NA  --    < > 140 143 137  K  --    < > 3.4* 3.8 3.5  CL  --    < > 103 105 105  CO2  --    < > 23 24 22   GLUCOSE  --    < > 176* 131* 146*  BUN  --    < > 32* 38* 37*  CREATININE  --    < > 1.45* 1.37* 1.36*  CALCIUM   --    < > 8.3* 8.5* 8.0*  MG 2.1  --   --   --   --    < > = values in this interval not displayed.   Recent Labs    08/24/22 0000 01/18/23 0000 01/20/23 0154  AST 23 22 19   ALT 14 16 15   ALKPHOS 108 108 82  BILITOT  --   --  1.5*  PROT  --   --  6.8  ALBUMIN 4.0 3.8 3.4*   Recent Labs    01/20/23 0154 01/20/23 0526 01/21/23 0259 01/22/23 0814  WBC 13.6*  --  11.7* 9.6  NEUTROABS 11.2*  --   --   --   HGB 15.6* 15.0 14.3 14.1  HCT 48.7* 44.0 45.2 45.3  MCV 95.9  --  95.0 96.0  PLT 128*  --  125* 126*   Lab Results  Component Value Date   TSH 1.29 01/18/2023   Lab Results  Component Value Date   HGBA1C 6.8 01/18/2023   Lab Results  Component Value Date   CHOL 146 01/18/2023   HDL 56 01/18/2023   LDLCALC 65 01/18/2023   TRIG 126 01/18/2023    Significant Diagnostic Results in last 30 days:  DG Chest 2 View Result Date: 01/23/2023 CLINICAL DATA:  Hypoxia. EXAM: CHEST - 2 VIEW COMPARISON:  01/20/2023 FINDINGS: The cardio pericardial silhouette is enlarged. Interstitial markings are diffusely coarsened with chronic features. The lungs are clear without focal pneumonia, edema, pneumothorax or pleural effusion. Bones are diffusely demineralized. Telemetry leads overlie the chest. IMPRESSION: Chronic interstitial  coarsening without acute cardiopulmonary findings. The right lower lobe paraspinal consolidative disease seen on previous CT is not readily evident on today's x-ray. Electronically Signed   By: Camellia Candle M.D.   On: 01/23/2023 09:09   ECHOCARDIOGRAM COMPLETE Result Date: 01/21/2023    ECHOCARDIOGRAM REPORT   Patient Name:   Kelsey Salinas Date of Exam: 01/21/2023 Medical Rec #:  [id number]       Height:       68.0 in Accession #:    [id number]      Weight:       159.4 lb Date of Birth:  05-15-1926       BSA:          1.856 m Patient Age:    89 years        BP:           164/89 mmHg Patient Gender: F               HR:           70 bpm. Exam Location:  Inpatient Procedure: 2D Echo, Cardiac Doppler and Color Doppler Indications:    Congestive Heart Failure  History:  Patient has prior history of Echocardiogram examinations, most                 recent 09/10/2021. Arrythmias:PAC and PVC; Risk                 Factors:Hypertension, Dyslipidemia and Former Smoker.  Sonographer:    Ozell Free Referring Phys: [id number] TIFFANY Lyndon IMPRESSIONS  1. Left ventricular ejection fraction, by estimation, is 60 to 65%. The left ventricle has normal function. The left ventricle has no regional wall motion abnormalities. There is mild concentric left ventricular hypertrophy. Left ventricular diastolic parameters are consistent with Grade I diastolic dysfunction (impaired relaxation).  2. Peak RV-RA gradient 67 mmHg. Right ventricular systolic function is normal. The right ventricular size is normal.  3. Left atrial size was severely dilated.  4. The mitral valve is normal in structure. Trivial mitral valve regurgitation. No evidence of mitral stenosis.  5. The aortic valve is tricuspid. Aortic valve regurgitation is trivial. No aortic stenosis is present.  6. The IVC was not well-visualized. FINDINGS  Left Ventricle: Left ventricular ejection fraction, by estimation, is 60 to 65%. The left ventricle has normal function.  The left ventricle has no regional wall motion abnormalities. The left ventricular internal cavity size was normal in size. There is  mild concentric left ventricular hypertrophy. Left ventricular diastolic parameters are consistent with Grade I diastolic dysfunction (impaired relaxation). Right Ventricle: Peak RV-RA gradient 67 mmHg. The right ventricular size is normal. No increase in right ventricular wall thickness. Right ventricular systolic function is normal. Left Atrium: Left atrial size was severely dilated. Right Atrium: Right atrial size was normal in size. Pericardium: There is no evidence of pericardial effusion. Mitral Valve: The mitral valve is normal in structure. Trivial mitral valve regurgitation. No evidence of mitral valve stenosis. Tricuspid Valve: The tricuspid valve is normal in structure. Tricuspid valve regurgitation is mild. Aortic Valve: The aortic valve is tricuspid. Aortic valve regurgitation is trivial. Aortic regurgitation PHT measures 449 msec. No aortic stenosis is present. Aortic valve mean gradient measures 3.0 mmHg. Aortic valve peak gradient measures 6.1 mmHg. Aortic valve area, by VTI measures 2.60 cm. Pulmonic Valve: The pulmonic valve was normal in structure. Pulmonic valve regurgitation is trivial. Aorta: The aortic root is normal in size and structure. Venous: The IVC was not well-visualized. The inferior vena cava was not well visualized. IAS/Shunts: No atrial level shunt detected by color flow Doppler.  LEFT VENTRICLE PLAX 2D LVIDd:         4.40 cm   Diastology LVIDs:         2.60 cm   LV e' medial:    2.94 cm/s LV PW:         1.00 cm   LV E/e' medial:  21.2 LV IVS:        1.30 cm   LV e' lateral:   6.09 cm/s LVOT diam:     2.10 cm   LV E/e' lateral: 10.2 LV SV:         51 LV SV Index:   27 LVOT Area:     3.46 cm  RIGHT VENTRICLE RV Basal diam:  3.70 cm RV S prime:     14.00 cm/s TAPSE (M-mode): 2.3 cm LEFT ATRIUM             Index        RIGHT ATRIUM           Index LA  diam:  4.70 cm 2.53 cm/m   RA Area:     16.90 cm LA Vol (A2C):   93.1 ml 50.17 ml/m  RA Volume:   42.80 ml  23.06 ml/m LA Vol (A4C):   97.5 ml 52.54 ml/m LA Biplane Vol: 96.2 ml 51.84 ml/m  AORTIC VALVE AV Area (Vmax):    2.61 cm AV Area (Vmean):   2.75 cm AV Area (VTI):     2.60 cm AV Vmax:           123.00 cm/s AV Vmean:          83.800 cm/s AV VTI:            0.196 m AV Peak Grad:      6.1 mmHg AV Mean Grad:      3.0 mmHg LVOT Vmax:         92.80 cm/s LVOT Vmean:        66.500 cm/s LVOT VTI:          0.147 m LVOT/AV VTI ratio: 0.75 AI PHT:            449 msec  AORTA Ao Root diam: 3.70 cm Ao Asc diam:  3.00 cm MITRAL VALVE               TRICUSPID VALVE MV Area (PHT): 4.19 cm    TR Peak grad:   66.9 mmHg MV Decel Time: 181 msec    TR Vmax:        409.00 cm/s MV E velocity: 62.20 cm/s MV A velocity: 81.20 cm/s  SHUNTS MV E/A ratio:  0.77        Systemic VTI:  0.15 m                            Systemic Diam: 2.10 cm Dalton McleanMD Electronically signed by Ezra Kanner Signature Date/Time: 01/21/2023/9:44:13 AM    Final    CT CHEST WO CONTRAST Result Date: 01/20/2023 CLINICAL DATA:  Chronic shortness of breath. EXAM: CT CHEST WITHOUT CONTRAST TECHNIQUE: Multidetector CT imaging of the chest was performed following the standard protocol without IV contrast. RADIATION DOSE REDUCTION: This exam was performed according to the departmental dose-optimization program which includes automated exposure control, adjustment of the mA and/or kV according to patient size and/or use of iterative reconstruction technique. COMPARISON:  Chest x-ray from same day. High-resolution chest CT dated October 08, 2021. FINDINGS: Cardiovascular: Chronic cardiomegaly. Similar trace pericardial fluid. No thoracic aortic aneurysm. Coronary, aortic arch, and branch vessel atherosclerotic vascular disease. Mediastinum/Nodes: No pathologically enlarged mediastinal or axillary lymph nodes. Prominent right paratracheal lymph  node is likely reactive. Mildly patulous esophagus. Thyroid  gland and trachea demonstrate no significant findings. Lungs/Pleura: Moderate emphysema. Trace right and minimal left pleural effusions. New consolidation in the posterior right upper lobe. Subsegmental atelectasis in the medial right lower lobe. No pneumothorax. Upper Abdomen: No acute abnormality. Musculoskeletal: No acute or significant osseous findings. IMPRESSION: 1. Posterior right upper lobe pneumonia. 2. Trace right and minimal left pleural effusions. 3. Aortic Atherosclerosis (ICD10-I70.0) and Emphysema (ICD10-J43.9). Electronically Signed   By: Elsie ONEIDA Shoulder M.D.   On: 01/20/2023 09:52   DG Chest 2 View Result Date: 01/20/2023 CLINICAL DATA:  10031.  Coughing, weakness and altered mentation. EXAM: CHEST - 2 VIEW COMPARISON:  HRCT 10/08/2021 FINDINGS: The lungs are emphysematous but clear with slightly elevated right hemidiaphragm. Mild cardiomegaly without overt CHF. Tortuous aorta with patchy calcifications and stable mediastinum. No pleural  effusion is seen. Osteopenia. There is chronic rotator cuff arthropathy with glenohumeral DJD, degenerative change of the spine. IMPRESSION: 1. Emphysema without evidence of acute chest disease. Stable COPD chest. 2. Mild cardiomegaly without overt CHF. 3. Aortic atherosclerosis and uncoiling. Electronically Signed   By: Francis Quam M.D.   On: 01/20/2023 01:35    Assessment/Plan 1. Community acquired pneumonia of right upper lobe of lung (Primary) - CT chest revealed posterior right upper lobe pneumonia and trace right and minimal left pleural effusion - completed IV ceftriaxone  and Zithromax  x 2 days - cont Augmentin  and Azithromax  - cont Trelegy - now on 3 liters oxygen > wean as tolerated  - cont incentive spirometer  2. Acute on chronic congestive heart failure, unspecified heart failure type (HCC) - BNP 762 - CT chest revealed trace and minimal left pleural effusion - given IV  lasix  - cont lasix  M/W/F  - start daily weights  3. Acute cystitis without hematuria - urine culture E.coli - completed ceftriaxone   4. Moderate dementia, unspecified dementia type, unspecified whether behavioral, psychotic, or mood disturbance or anxiety (HCC) - MMSE 27/30 - upset when she cannot answer some questions - cont Aricept  and Namenda   5. Essential hypertension - controlled with losartan  and metoprolol   6. H/O pulmonary fibrosis - recent CT chest> no progression - schedule with pulmonary     Family/ staff Communication: plan discussed with patient and nurse  Labs/tests ordered:  bmp 01/16

## 2023-01-26 DIAGNOSIS — R293 Abnormal posture: Secondary | ICD-10-CM | POA: Diagnosis not present

## 2023-01-26 DIAGNOSIS — M62561 Muscle wasting and atrophy, not elsewhere classified, right lower leg: Secondary | ICD-10-CM | POA: Diagnosis not present

## 2023-01-26 DIAGNOSIS — R2689 Other abnormalities of gait and mobility: Secondary | ICD-10-CM | POA: Diagnosis not present

## 2023-01-26 DIAGNOSIS — M6259 Muscle wasting and atrophy, not elsewhere classified, multiple sites: Secondary | ICD-10-CM | POA: Diagnosis not present

## 2023-01-26 DIAGNOSIS — R531 Weakness: Secondary | ICD-10-CM | POA: Diagnosis not present

## 2023-01-26 DIAGNOSIS — R278 Other lack of coordination: Secondary | ICD-10-CM | POA: Diagnosis not present

## 2023-01-27 DIAGNOSIS — M62561 Muscle wasting and atrophy, not elsewhere classified, right lower leg: Secondary | ICD-10-CM | POA: Diagnosis not present

## 2023-01-27 DIAGNOSIS — R531 Weakness: Secondary | ICD-10-CM | POA: Diagnosis not present

## 2023-01-27 DIAGNOSIS — M6259 Muscle wasting and atrophy, not elsewhere classified, multiple sites: Secondary | ICD-10-CM | POA: Diagnosis not present

## 2023-01-27 DIAGNOSIS — R278 Other lack of coordination: Secondary | ICD-10-CM | POA: Diagnosis not present

## 2023-01-27 DIAGNOSIS — R2689 Other abnormalities of gait and mobility: Secondary | ICD-10-CM | POA: Diagnosis not present

## 2023-01-27 DIAGNOSIS — R293 Abnormal posture: Secondary | ICD-10-CM | POA: Diagnosis not present

## 2023-01-27 DIAGNOSIS — I5032 Chronic diastolic (congestive) heart failure: Secondary | ICD-10-CM | POA: Diagnosis not present

## 2023-01-28 DIAGNOSIS — R293 Abnormal posture: Secondary | ICD-10-CM | POA: Diagnosis not present

## 2023-01-28 DIAGNOSIS — M6259 Muscle wasting and atrophy, not elsewhere classified, multiple sites: Secondary | ICD-10-CM | POA: Diagnosis not present

## 2023-01-28 DIAGNOSIS — R2689 Other abnormalities of gait and mobility: Secondary | ICD-10-CM | POA: Diagnosis not present

## 2023-01-28 DIAGNOSIS — M62561 Muscle wasting and atrophy, not elsewhere classified, right lower leg: Secondary | ICD-10-CM | POA: Diagnosis not present

## 2023-01-28 DIAGNOSIS — R278 Other lack of coordination: Secondary | ICD-10-CM | POA: Diagnosis not present

## 2023-01-28 DIAGNOSIS — R531 Weakness: Secondary | ICD-10-CM | POA: Diagnosis not present

## 2023-01-31 ENCOUNTER — Encounter: Payer: Self-pay | Admitting: Internal Medicine

## 2023-01-31 ENCOUNTER — Non-Acute Institutional Stay (SKILLED_NURSING_FACILITY): Payer: Self-pay | Admitting: Internal Medicine

## 2023-01-31 DIAGNOSIS — F330 Major depressive disorder, recurrent, mild: Secondary | ICD-10-CM | POA: Diagnosis not present

## 2023-01-31 DIAGNOSIS — R2689 Other abnormalities of gait and mobility: Secondary | ICD-10-CM | POA: Diagnosis not present

## 2023-01-31 DIAGNOSIS — R293 Abnormal posture: Secondary | ICD-10-CM | POA: Diagnosis not present

## 2023-01-31 DIAGNOSIS — I10 Essential (primary) hypertension: Secondary | ICD-10-CM

## 2023-01-31 DIAGNOSIS — I509 Heart failure, unspecified: Secondary | ICD-10-CM | POA: Diagnosis not present

## 2023-01-31 DIAGNOSIS — F03B0 Unspecified dementia, moderate, without behavioral disturbance, psychotic disturbance, mood disturbance, and anxiety: Secondary | ICD-10-CM

## 2023-01-31 DIAGNOSIS — N3000 Acute cystitis without hematuria: Secondary | ICD-10-CM

## 2023-01-31 DIAGNOSIS — J189 Pneumonia, unspecified organism: Secondary | ICD-10-CM

## 2023-01-31 DIAGNOSIS — M62561 Muscle wasting and atrophy, not elsewhere classified, right lower leg: Secondary | ICD-10-CM | POA: Diagnosis not present

## 2023-01-31 DIAGNOSIS — R278 Other lack of coordination: Secondary | ICD-10-CM | POA: Diagnosis not present

## 2023-01-31 DIAGNOSIS — M6259 Muscle wasting and atrophy, not elsewhere classified, multiple sites: Secondary | ICD-10-CM | POA: Diagnosis not present

## 2023-01-31 DIAGNOSIS — R531 Weakness: Secondary | ICD-10-CM | POA: Diagnosis not present

## 2023-01-31 NOTE — Progress Notes (Signed)
Provider:   Location:  [facility] Room Number: 158A Place of Service:  SNF (31)  PCP: [name], MD Patient Care Team: [name], MD as PCP - General (Internal Medicine) Jodelle Red, MD as PCP - Cardiology (Cardiology) Ollen Gross, MD as Consulting Physician (Orthopedic Surgery) Lyn Records, MD (Inactive) as Consulting Physician (Cardiology) Mateo Flow, MD as Consulting Physician (Ophthalmology) Salvatore Marvel, MD as Consulting Physician (Orthopedic Surgery)  Extended Emergency Contact Information Primary Emergency Contact: Ludwig [place] Home Phone: [phone] Mobile Phone: [phone] Relation: Daughter Secondary Emergency Contact: [facility] Home Phone: [phone] Mobile Phone: [phone] Relation: Niece  Code Status: full code Goals of Care: Advanced Directive information    01/31/2023   11:09 AM  Advanced Directives  Does Patient Have a Medical Advance Directive? No  Would patient like information on creating a medical advance directive? No - Patient declined      Chief Complaint  Patient presents with   New Admit To SNF    Patient is being seen for new admission.    Quality Metric Gaps    Patient due for eye exam.    HPI: Patient is a 89 y.o. female seen today for admission to Rehab for Therapy  Lives in [place] with 24/7 care  She was admitted in the hospital from 01/09-01/13 for CAP with respiratory failure  Patient has a history of Alzheimer's dementia, diastolic CHF, HLD, arthritis, history of osteoporosis, allergic rhinitis  And recent diagnosis of ILD Refused treatment or further work up  Patient went to the hospital for confusion and weakness.  She was found to be hypoxic  her chest x-ray was negative.  But the CT scan showed right upper lobe pneumonia Her urine culture was also positive for E. Coli BNP was elevated at 762 and troponins were  mildly elevated Was treated with antibiotics 2D echo showed no wall motion abnormalities Diastolic Dysfunction with Dilated Left Atrium Lasix was increased to 3 times a week  Patient continues to need 3 L of oxygen  .  The caregiver in the room says patient is also feeling depressed does not want to get up.  Also feels weak and is needing more help with her ADLs.  They are using Michiel Sites.  Initially at home she was doing stand and pivot  the cough is not there but she does seem little short of breath. Swelling is better in the legs. Wt Readings from Last 3 Encounters:  01/31/23 160 lb 12.8 oz (72.9 kg)  01/25/23 164 lb 6.4 oz (74.6 kg)  01/24/23 159 lb 13.3 oz (72.5 kg)    Patient not very good historian.  She says she is okay denies everything. .  Past Medical History:  Diagnosis Date   Abscess of right leg 12/24/2020   Childhood asthma    Chronic diastolic heart failure (HCC) 12/25/2012   October 2013 echocardiogram 1. There is moderate concentric left ventricle hypertrophy. 2. Left ventricular ejection fraction estimated by 2D at 60-65 percent. 3. There were no regional wall motion abnormalities. 4. Mild mitral annular calcification. 5. Mild mitral valve regurgitation. 6. There is mild tricuspid regurgitation. 7. Mildly elevated estimated right ventricular systolic pressure. 8. Right ventricular systolic pressure estimated at 35-40 mm Hg. 9. Mild calcification of the aortic valve. 10. Trace aortic valve regurgitation. 11. Analysis of mitral valve inflow, pulmonary vein Doppler and tissue Doppler suggests grade I diastolic dysfunction without elevated left atrial pressure.    Colon  polyp    Diverticular stricture (HCC) 2009   Diverticulosis 10/30/2007   DM (diabetes mellitus), type 2 (HCC)    Edema 09/12/2013   Fall 05/18/2017   tripped over wheel of walker; superior and inferior pubic ramus fractures    Hypercholesterolemia    Hypertension    Hypertension, essential    Hypothyroidism     as a teenager   Indigestion    Insomnia    OA (osteoarthritis) of hip    right hip replacement, OA in bilateral knees   Osteopenia    Actonel stopped in 2012 after 5years therapy   Pneumonia 1939   "hospitalized"   Rapid resting heart rate 12/25/2012   Uncertain cause. Possible ectopic atrial tachycardia. Date entered 12/25/12    Right rotator cuff tear    Seasonal allergies    Unsteadiness on feet    Past Surgical History:  Procedure Laterality Date   CATARACT EXTRACTION W/ INTRAOCULAR LENS  IMPLANT, BILATERAL Bilateral    COLONOSCOPY  2009   Dr. Melchor Amour   DILATION AND CURETTAGE OF UTERUS     FRACTURE SURGERY     JOINT REPLACEMENT     ORIF WRIST FRACTURE Right    REDUCTION MAMMAPLASTY  1999   TOTAL HIP ARTHROPLASTY Right 03-04-08   Dr. Lequita Halt    reports that she has quit smoking. Her smoking use included cigarettes. She has a 6 pack-year smoking history. She has never used smokeless tobacco. She reports current alcohol use. She reports that she does not use drugs. Social History   Socioeconomic History   Marital status: Married    Spouse name: Not on file   Number of children: Not on file   Years of education: Not on file   Highest education level: Not on file  Occupational History   Occupation: retired travel agent  Tobacco Use   Smoking status: Former    Current packs/day: 0.12    Average packs/day: 0.1 packs/day for 50.0 years (6.0 ttl pk-yrs)    Types: Cigarettes   Smokeless tobacco: Never   Tobacco comments:    "stopped in the ate 1990s"  Vaping Use   Vaping status: Never Used  Substance and Sexual Activity   Alcohol use: Yes    Comment: rarely   Drug use: Never   Sexual activity: Not Currently  Other Topics Concern   Not on file  Social History Narrative   Lives at [facility] since 2010   Husband Adela Glimpse married 1952 (husband in memory care unit at [employer])   Former smoker 1/2 PPD, for 30 years, stopped 01/10/1989   Alcohol 2 glasses of wine  at night   Exercise: machines 3 times a week and daily walk   No POA/LW         Social Drivers of Corporate investment banker Strain: Low Risk  (07/19/2017)   Overall Financial Resource Strain (CARDIA)    Difficulty of Paying Living Expenses: Not hard at all  Food Insecurity: No Food Insecurity (01/20/2023)   Hunger Vital Sign    Worried About Running Out of Food in the Last Year: Never true    Ran Out of Food in the Last Year: Never true  Transportation Needs: No Transportation Needs (01/20/2023)   PRAPARE - Administrator, Civil Service (Medical): No    Lack of Transportation (Non-Medical): No  Physical Activity: Insufficiently Active (07/19/2017)   Exercise Vital Sign    Days of Exercise per Week: 7 days    Minutes  of Exercise per Session: 20 min  Stress: Stress Concern Present (07/19/2017)   [employer] of Occupational Health - Occupational Stress Questionnaire    Feeling of Stress : To some extent  Social Connections: Moderately Integrated (01/20/2023)   Social Connection and Isolation Panel [NHANES]    Frequency of Communication with Friends and Family: Three times a week    Frequency of Social Gatherings with Friends and Family: More than three times a week    Attends Religious Services: More than 4 times per year    Active Member of Clubs or Organizations: No    Attends Banker Meetings: Never    Marital Status: Married  Catering manager Violence: Not At Risk (01/20/2023)   Humiliation, Afraid, Rape, and Kick questionnaire    Fear of Current or Ex-Partner: No    Emotionally Abused: No    Physically Abused: No    Sexually Abused: No    Functional Status Survey:    Family History  Problem Relation Age of Onset   Heart disease Mother    Heart disease Father    Heart disease Brother     Health Maintenance  Topic Date Due   OPHTHALMOLOGY EXAM  07/11/2020   FOOT EXAM  05/04/2023   Medicare Annual Wellness (AWV)  05/14/2023   HEMOGLOBIN A1C   07/18/2023   DTaP/Tdap/Td (2 - Td or Tdap) 07/20/2027   Pneumonia Vaccine 74+ Years old  Completed   INFLUENZA VACCINE  Completed   DEXA SCAN  Completed   COVID-19 Vaccine  Completed   Zoster Vaccines- Shingrix  Completed   HPV VACCINES  Aged Out    Allergies  Allergen Reactions   Trimethoprim     Per matrix   Amlodipine Besylate Other (See Comments)    Edema   Lisinopril Cough    Cough    Sulfamethoxazole-Trimethoprim Other (See Comments)    Unknown reaction     Outpatient Encounter Medications as of 01/31/2023  Medication Sig   acetaminophen (TYLENOL) 500 MG tablet Take 500 mg by mouth daily.   albuterol (VENTOLIN HFA) 108 (90 Base) MCG/ACT inhaler Inhale 2 puffs into the lungs every 6 (six) hours as needed for wheezing or shortness of breath.   atorvastatin (LIPITOR) 40 MG tablet TAKE 1 TABLET ONCE DAILY.   Cholecalciferol 1.25 MG (50000 UT) capsule Take 50,000 Units by mouth daily.   diclofenac Sodium (VOLTAREN) 1 % GEL Apply 2 g topically 4 (four) times daily as needed (arthritis pain).   donepezil (ARICEPT) 10 MG tablet TAKE ONE TABLET BY MOUTH AT BEDTIME   empagliflozin ([name]) 10 MG TABS tablet Take 1 tablet (10 mg total) by mouth daily before breakfast.   famotidine (PEPCID) 20 MG tablet Take 20 mg by mouth daily.   fexofenadine (ALLEGRA) 180 MG tablet Take 1 tablet (180 mg total) by mouth daily.   Fluticasone-Umeclidin-Vilant (TRELEGY ELLIPTA) 200-62.5-25 MCG/ACT AEPB Inhale 1 puff into the lungs daily.   furosemide (LASIX) 40 MG tablet Take 1 tablet (40 mg total) by mouth every Monday, Wednesday, and Friday.   guaiFENesin (MUCINEX) 600 MG 12 hr tablet Take 1 tablet (600 mg total) by mouth 2 (two) times daily.   losartan (COZAAR) 100 MG tablet Take 100 mg by mouth daily.   memantine (NAMENDA) 10 MG tablet TAKE 1 TABLET BY MOUTH TWICE DAILY.   metoprolol succinate (TOPROL-XL) 100 MG 24 hr tablet TAKE 1 AND 1/2 TABLETS BY MOUTH IN THE MORNING AND AT BEDTIME    NYAMYC powder Apply  1 Application topically 2 (two) times daily.   polyethylene glycol (MIRALAX / GLYCOLAX) 17 g packet Take 17 g by mouth daily.   potassium chloride SA (KLOR-CON M) 20 MEQ tablet Take 1 tablet (20 mEq total) by mouth once as needed for up to 1 dose. Take it if takes Lasix 2/week (Patient taking differently: Take 20 mEq by mouth. 20 mEq, oral, Once A Day on Mon, Wed, Fri, Take 20 mEq po 3 times weekly on M,W,F with Lasix.)   sodium chloride (OCEAN) 0.65 % SOLN nasal spray Place 1 spray into both nostrils as needed for congestion.   triamcinolone (NASACORT) 55 MCG/ACT AERO nasal inhaler Place 2 sprays into the nose daily.   vitamin B-12 (CYANOCOBALAMIN) 1000 MCG tablet Take 1 tablet (1,000 mcg total) by mouth daily.   No facility-administered encounter medications on file as of 01/31/2023.    Review of Systems  Constitutional:  Positive for activity change. Negative for appetite change.  HENT: Negative.    Respiratory:  Positive for shortness of breath. Negative for cough.   Cardiovascular:  Positive for leg swelling.  Gastrointestinal:  Negative for constipation.  Genitourinary: Negative.   Musculoskeletal:  Positive for gait problem. Negative for arthralgias and myalgias.  Skin: Negative.   Neurological:  Negative for dizziness and weakness.  Psychiatric/Behavioral:  Positive for confusion and dysphoric mood. Negative for sleep disturbance.     Vitals:   01/31/23 1018  BP: (!) 162/82  Pulse: 73  Resp: 18  Temp: 97.7 F (36.5 C)  SpO2: 92%  Weight: 160 lb 12.8 oz (72.9 kg)  Height: 5\' 8"  (1.727 m)   Body mass index is 24.45 kg/m. Physical Exam Vitals reviewed.  Constitutional:      Appearance: Normal appearance.  HENT:     Head: Normocephalic.     Nose: Nose normal.     Mouth/Throat:     Mouth: Mucous membranes are moist.     Pharynx: Oropharynx is clear.  Eyes:     Pupils: Pupils are equal, round, and reactive to light.  Cardiovascular:     Rate and  Rhythm: Normal rate. Rhythm irregular.     Pulses: Normal pulses.     Heart sounds: Normal heart sounds. No murmur heard. Pulmonary:     Effort: Pulmonary effort is normal.     Breath sounds: Normal breath sounds.     Comments: Rales present in Both Bases Abdominal:     General: Abdomen is flat. Bowel sounds are normal.     Palpations: Abdomen is soft.  Musculoskeletal:        General: No swelling.     Cervical back: Neck supple.     Comments: Mild swelling  Skin:    General: Skin is warm.  Neurological:     General: No focal deficit present.     Mental Status: She is alert.  Psychiatric:        Mood and Affect: Mood normal.        Thought Content: Thought content normal.    Labs reviewed: Basic Metabolic Panel: Recent Labs    01/20/23 0843 01/21/23 0259 01/22/23 0814 01/23/23 0725 01/24/23 0245  NA  --    < > 140 143 137  K  --    < > 3.4* 3.8 3.5  CL  --    < > 103 105 105  CO2  --    < > 23 24 22   GLUCOSE  --    < > 176* 131* 146*  BUN  --    < > 32* 38* 37*  CREATININE  --    < > 1.45* 1.37* 1.36*  CALCIUM  --    < > 8.3* 8.5* 8.0*  MG 2.1  --   --   --   --    < > = values in this interval not displayed.   Liver Function Tests: Recent Labs    08/24/22 0000 01/18/23 0000 01/20/23 0154  AST 23 22 19   ALT 14 16 15   ALKPHOS 108 108 82  BILITOT  --   --  1.5*  PROT  --   --  6.8  ALBUMIN 4.0 3.8 3.4*   No results for input(s): "LIPASE", "AMYLASE" in the last 8760 hours. No results for input(s): "AMMONIA" in the last 8760 hours. CBC: Recent Labs    01/20/23 0154 01/20/23 0526 01/21/23 0259 01/22/23 0814  WBC 13.6*  --  11.7* 9.6  NEUTROABS 11.2*  --   --   --   HGB 15.6* 15.0 14.3 14.1  HCT 48.7* 44.0 45.2 45.3  MCV 95.9  --  95.0 96.0  PLT 128*  --  125* 126*   Cardiac Enzymes: No results for input(s): "CKTOTAL", "CKMB", "CKMBINDEX", "TROPONINI" in the last 8760 hours. BNP: Invalid input(s): "POCBNP" Lab Results  Component Value Date    HGBA1C 6.8 01/18/2023   Lab Results  Component Value Date   TSH 1.29 01/18/2023   Lab Results  Component Value Date   VITAMINB12 1,697 08/30/2019   No results found for: "FOLATE" No results found for: "IRON", "TIBC", "FERRITIN"  Imaging and Procedures obtained prior to SNF admission: ECHOCARDIOGRAM COMPLETE Result Date: 01/21/2023    ECHOCARDIOGRAM REPORT   Patient Name:   OPAL ALVILLAR Date of Exam: 01/21/2023 Medical Rec #:  [id number]       Height:       68.0 in Accession #:    [id number]      Weight:       159.4 lb Date of Birth:  1926-06-22       BSA:          1.856 m Patient Age:    89 years        BP:           164/89 mmHg Patient Gender: F               HR:           70 bpm. Exam Location:  Inpatient Procedure: 2D Echo, Cardiac Doppler and Color Doppler Indications:    Congestive Heart Failure  History:        Patient has prior history of Echocardiogram examinations, most                 recent 09/10/2021. Arrythmias:PAC and PVC; Risk                 Factors:Hypertension, Dyslipidemia and Former Smoker.  Sonographer:    Karma Ganja Referring Phys: [id number] TIFFANY [place] IMPRESSIONS  1. Left ventricular ejection fraction, by estimation, is 60 to 65%. The left ventricle has normal function. The left ventricle has no regional wall motion abnormalities. There is mild concentric left ventricular hypertrophy. Left ventricular diastolic parameters are consistent with Grade I diastolic dysfunction (impaired relaxation).  2. Peak RV-RA gradient 67 mmHg. Right ventricular systolic function is normal. The right ventricular size is normal.  3. Left atrial size was severely dilated.  4. The mitral valve is normal in structure. Trivial  mitral valve regurgitation. No evidence of mitral stenosis.  5. The aortic valve is tricuspid. Aortic valve regurgitation is trivial. No aortic stenosis is present.  6. The IVC was not well-visualized. FINDINGS  Left Ventricle: Left ventricular ejection fraction, by  estimation, is 60 to 65%. The left ventricle has normal function. The left ventricle has no regional wall motion abnormalities. The left ventricular internal cavity size was normal in size. There is  mild concentric left ventricular hypertrophy. Left ventricular diastolic parameters are consistent with Grade I diastolic dysfunction (impaired relaxation). Right Ventricle: Peak RV-RA gradient 67 mmHg. The right ventricular size is normal. No increase in right ventricular wall thickness. Right ventricular systolic function is normal. Left Atrium: Left atrial size was severely dilated. Right Atrium: Right atrial size was normal in size. Pericardium: There is no evidence of pericardial effusion. Mitral Valve: The mitral valve is normal in structure. Trivial mitral valve regurgitation. No evidence of mitral valve stenosis. Tricuspid Valve: The tricuspid valve is normal in structure. Tricuspid valve regurgitation is mild. Aortic Valve: The aortic valve is tricuspid. Aortic valve regurgitation is trivial. Aortic regurgitation PHT measures 449 msec. No aortic stenosis is present. Aortic valve mean gradient measures 3.0 mmHg. Aortic valve peak gradient measures 6.1 mmHg. Aortic valve area, by VTI measures 2.60 cm. Pulmonic Valve: The pulmonic valve was normal in structure. Pulmonic valve regurgitation is trivial. Aorta: The aortic root is normal in size and structure. Venous: The IVC was not well-visualized. The inferior vena cava was not well visualized. IAS/Shunts: No atrial level shunt detected by color flow Doppler.  LEFT VENTRICLE PLAX 2D LVIDd:         4.40 cm   Diastology LVIDs:         2.60 cm   LV e' medial:    2.94 cm/s LV PW:         1.00 cm   LV E/e' medial:  21.2 LV IVS:        1.30 cm   LV e' lateral:   6.09 cm/s LVOT diam:     2.10 cm   LV E/e' lateral: 10.2 LV SV:         51 LV SV Index:   27 LVOT Area:     3.46 cm  RIGHT VENTRICLE RV Basal diam:  3.70 cm RV S prime:     14.00 cm/s TAPSE (M-mode): 2.3 cm  LEFT ATRIUM             Index        RIGHT ATRIUM           Index LA diam:        4.70 cm 2.53 cm/m   RA Area:     16.90 cm LA Vol (A2C):   93.1 ml 50.17 ml/m  RA Volume:   42.80 ml  23.06 ml/m LA Vol (A4C):   97.5 ml 52.54 ml/m LA Biplane Vol: 96.2 ml 51.84 ml/m  AORTIC VALVE AV Area (Vmax):    2.61 cm AV Area (Vmean):   2.75 cm AV Area (VTI):     2.60 cm AV Vmax:           123.00 cm/s AV Vmean:          83.800 cm/s AV VTI:            0.196 m AV Peak Grad:      6.1 mmHg AV Mean Grad:      3.0 mmHg LVOT Vmax:  92.80 cm/s LVOT Vmean:        66.500 cm/s LVOT VTI:          0.147 m LVOT/AV VTI ratio: 0.75 AI PHT:            449 msec  AORTA Ao Root diam: 3.70 cm Ao Asc diam:  3.00 cm MITRAL VALVE               TRICUSPID VALVE MV Area (PHT): 4.19 cm    TR Peak grad:   66.9 mmHg MV Decel Time: 181 msec    TR Vmax:        409.00 cm/s MV E velocity: 62.20 cm/s MV A velocity: 81.20 cm/s  SHUNTS MV E/A ratio:  0.77        Systemic VTI:  0.15 m                            Systemic Diam: 2.10 cm Dalton McleanMD Electronically signed by Wilfred Lacy Signature Date/Time: 01/21/2023/9:44:13 AM    Final    CT CHEST WO CONTRAST Result Date: 01/20/2023 CLINICAL DATA:  Chronic shortness of breath. EXAM: CT CHEST WITHOUT CONTRAST TECHNIQUE: Multidetector CT imaging of the chest was performed following the standard protocol without IV contrast. RADIATION DOSE REDUCTION: This exam was performed according to the departmental dose-optimization program which includes automated exposure control, adjustment of the mA and/or kV according to patient size and/or use of iterative reconstruction technique. COMPARISON:  Chest x-ray from same day. High-resolution chest CT dated October 08, 2021. FINDINGS: Cardiovascular: Chronic cardiomegaly. Similar trace pericardial fluid. No thoracic aortic aneurysm. Coronary, aortic arch, and branch vessel atherosclerotic vascular disease. Mediastinum/Nodes: No pathologically enlarged  mediastinal or axillary lymph nodes. Prominent right paratracheal lymph node is likely reactive. Mildly patulous esophagus. Thyroid gland and trachea demonstrate no significant findings. Lungs/Pleura: Moderate emphysema. Trace right and minimal left pleural effusions. New consolidation in the posterior right upper lobe. Subsegmental atelectasis in the medial right lower lobe. No pneumothorax. Upper Abdomen: No acute abnormality. Musculoskeletal: No acute or significant osseous findings. IMPRESSION: 1. Posterior right upper lobe pneumonia. 2. Trace right and minimal left pleural effusions. 3. Aortic Atherosclerosis (ICD10-I70.0) and Emphysema (ICD10-J43.9). Electronically Signed   By: Obie Dredge M.D.   On: 01/20/2023 09:52   DG Chest 2 View Result Date: 01/20/2023 CLINICAL DATA:  10031.  Coughing, weakness and altered mentation. EXAM: CHEST - 2 VIEW COMPARISON:  HRCT 10/08/2021 FINDINGS: The lungs are emphysematous but clear with slightly elevated right hemidiaphragm. Mild cardiomegaly without overt CHF. Tortuous aorta with patchy calcifications and stable mediastinum. No pleural effusion is seen. Osteopenia. There is chronic rotator cuff arthropathy with glenohumeral DJD, degenerative change of the spine. IMPRESSION: 1. Emphysema without evidence of acute chest disease. Stable COPD chest. 2. Mild cardiomegaly without overt CHF. 3. Aortic atherosclerosis and uncoiling. Electronically Signed   By: Almira Bar M.D.   On: 01/20/2023 01:35    Assessment/Plan Community acquired pneumonia of right upper lobe of lung (Primary) Finished Antibiotics Chest Xray has not shown any infilterate  COPD Patient continues to need 3 l of Oxygen Also has Rales in her Exam  Will start her on low dose of Prednisone 40 mg every day for 5 days Also Nebs Schedeuled Xopenox BID for 3 days She is on Trelegy already  Acute cystitis without hematuria Treeated with Antibiotics   Acute on chronic congestive heart  failure, unspecified heart failure type (HCC) Now on Lasix  3/week Also oN jardiance Repeat BMP   Moderate dementia, unspecified dementia type, unspecified whether behavioral, psychotic, or mood disturbance or anxiety (HCC) Namenda and Aricept  Essential hypertension Controlled   Mild episode of recurrent major depressive disorder (HCC) Will wait before adding new med Weakness Working with Therapy H/o PAF On High dose of metoprolol  DM with CKD On [name]  Family/ staff Communication:   Labs/tests ordered:Bmp and CBC

## 2023-02-01 ENCOUNTER — Other Ambulatory Visit: Payer: Self-pay | Admitting: Orthopedic Surgery

## 2023-02-01 DIAGNOSIS — R293 Abnormal posture: Secondary | ICD-10-CM | POA: Diagnosis not present

## 2023-02-01 DIAGNOSIS — M6259 Muscle wasting and atrophy, not elsewhere classified, multiple sites: Secondary | ICD-10-CM | POA: Diagnosis not present

## 2023-02-01 DIAGNOSIS — M62561 Muscle wasting and atrophy, not elsewhere classified, right lower leg: Secondary | ICD-10-CM | POA: Diagnosis not present

## 2023-02-01 DIAGNOSIS — R2689 Other abnormalities of gait and mobility: Secondary | ICD-10-CM | POA: Diagnosis not present

## 2023-02-01 DIAGNOSIS — R278 Other lack of coordination: Secondary | ICD-10-CM | POA: Diagnosis not present

## 2023-02-01 DIAGNOSIS — R531 Weakness: Secondary | ICD-10-CM | POA: Diagnosis not present

## 2023-02-01 DIAGNOSIS — E7800 Pure hypercholesterolemia, unspecified: Secondary | ICD-10-CM | POA: Diagnosis not present

## 2023-02-01 DIAGNOSIS — J189 Pneumonia, unspecified organism: Secondary | ICD-10-CM

## 2023-02-01 DIAGNOSIS — I5032 Chronic diastolic (congestive) heart failure: Secondary | ICD-10-CM | POA: Diagnosis not present

## 2023-02-01 MED ORDER — LEVALBUTEROL HCL 0.31 MG/3ML IN NEBU: 1.0000 | INHALATION_SOLUTION | Freq: Two times a day (BID) | RESPIRATORY_TRACT | 0 refills | Status: DC

## 2023-02-02 DIAGNOSIS — M62561 Muscle wasting and atrophy, not elsewhere classified, right lower leg: Secondary | ICD-10-CM | POA: Diagnosis not present

## 2023-02-02 DIAGNOSIS — M6259 Muscle wasting and atrophy, not elsewhere classified, multiple sites: Secondary | ICD-10-CM | POA: Diagnosis not present

## 2023-02-02 DIAGNOSIS — R293 Abnormal posture: Secondary | ICD-10-CM | POA: Diagnosis not present

## 2023-02-02 DIAGNOSIS — R531 Weakness: Secondary | ICD-10-CM | POA: Diagnosis not present

## 2023-02-02 DIAGNOSIS — R2689 Other abnormalities of gait and mobility: Secondary | ICD-10-CM | POA: Diagnosis not present

## 2023-02-02 DIAGNOSIS — R278 Other lack of coordination: Secondary | ICD-10-CM | POA: Diagnosis not present

## 2023-02-03 DIAGNOSIS — R531 Weakness: Secondary | ICD-10-CM | POA: Diagnosis not present

## 2023-02-03 DIAGNOSIS — R2689 Other abnormalities of gait and mobility: Secondary | ICD-10-CM | POA: Diagnosis not present

## 2023-02-03 DIAGNOSIS — M6259 Muscle wasting and atrophy, not elsewhere classified, multiple sites: Secondary | ICD-10-CM | POA: Diagnosis not present

## 2023-02-03 DIAGNOSIS — R278 Other lack of coordination: Secondary | ICD-10-CM | POA: Diagnosis not present

## 2023-02-03 DIAGNOSIS — M62561 Muscle wasting and atrophy, not elsewhere classified, right lower leg: Secondary | ICD-10-CM | POA: Diagnosis not present

## 2023-02-03 DIAGNOSIS — R293 Abnormal posture: Secondary | ICD-10-CM | POA: Diagnosis not present

## 2023-02-04 DIAGNOSIS — R293 Abnormal posture: Secondary | ICD-10-CM | POA: Diagnosis not present

## 2023-02-04 DIAGNOSIS — M6259 Muscle wasting and atrophy, not elsewhere classified, multiple sites: Secondary | ICD-10-CM | POA: Diagnosis not present

## 2023-02-04 DIAGNOSIS — R531 Weakness: Secondary | ICD-10-CM | POA: Diagnosis not present

## 2023-02-04 DIAGNOSIS — R278 Other lack of coordination: Secondary | ICD-10-CM | POA: Diagnosis not present

## 2023-02-04 DIAGNOSIS — M62561 Muscle wasting and atrophy, not elsewhere classified, right lower leg: Secondary | ICD-10-CM | POA: Diagnosis not present

## 2023-02-04 DIAGNOSIS — R2689 Other abnormalities of gait and mobility: Secondary | ICD-10-CM | POA: Diagnosis not present

## 2023-02-06 ENCOUNTER — Inpatient Hospital Stay (HOSPITAL_COMMUNITY)
Admission: EM | Admit: 2023-02-06 | Discharge: 2023-02-09 | DRG: 300 | Disposition: A | Discharge status: Skilled Nursing Facility | Payer: Medicare Other | Source: Skilled Nursing Facility | Attending: Internal Medicine | Admitting: Internal Medicine

## 2023-02-06 ENCOUNTER — Other Ambulatory Visit: Payer: Self-pay

## 2023-02-06 ENCOUNTER — Telehealth: Payer: Self-pay | Admitting: Nurse Practitioner

## 2023-02-06 DIAGNOSIS — R609 Edema, unspecified: Secondary | ICD-10-CM | POA: Diagnosis not present

## 2023-02-06 DIAGNOSIS — I130 Hypertensive heart and chronic kidney disease with heart failure and stage 1 through stage 4 chronic kidney disease, or unspecified chronic kidney disease: Secondary | ICD-10-CM | POA: Diagnosis present

## 2023-02-06 DIAGNOSIS — Z79899 Other long term (current) drug therapy: Secondary | ICD-10-CM

## 2023-02-06 DIAGNOSIS — E7800 Pure hypercholesterolemia, unspecified: Secondary | ICD-10-CM | POA: Diagnosis present

## 2023-02-06 DIAGNOSIS — G309 Alzheimer's disease, unspecified: Secondary | ICD-10-CM | POA: Diagnosis present

## 2023-02-06 DIAGNOSIS — Z7951 Long term (current) use of inhaled steroids: Secondary | ICD-10-CM

## 2023-02-06 DIAGNOSIS — N179 Acute kidney failure, unspecified: Secondary | ICD-10-CM | POA: Diagnosis present

## 2023-02-06 DIAGNOSIS — N189 Chronic kidney disease, unspecified: Secondary | ICD-10-CM | POA: Diagnosis present

## 2023-02-06 DIAGNOSIS — I824Y2 Acute embolism and thrombosis of unspecified deep veins of left proximal lower extremity: Principal | ICD-10-CM

## 2023-02-06 DIAGNOSIS — J9601 Acute respiratory failure with hypoxia: Secondary | ICD-10-CM | POA: Diagnosis not present

## 2023-02-06 DIAGNOSIS — I471 Supraventricular tachycardia, unspecified: Secondary | ICD-10-CM | POA: Diagnosis present

## 2023-02-06 DIAGNOSIS — R7989 Other specified abnormal findings of blood chemistry: Secondary | ICD-10-CM | POA: Diagnosis not present

## 2023-02-06 DIAGNOSIS — F0280 Dementia in other diseases classified elsewhere without behavioral disturbance: Secondary | ICD-10-CM | POA: Diagnosis present

## 2023-02-06 DIAGNOSIS — E039 Hypothyroidism, unspecified: Secondary | ICD-10-CM | POA: Diagnosis present

## 2023-02-06 DIAGNOSIS — D72825 Bandemia: Secondary | ICD-10-CM | POA: Diagnosis not present

## 2023-02-06 DIAGNOSIS — J9611 Chronic respiratory failure with hypoxia: Secondary | ICD-10-CM | POA: Diagnosis present

## 2023-02-06 DIAGNOSIS — Z882 Allergy status to sulfonamides status: Secondary | ICD-10-CM

## 2023-02-06 DIAGNOSIS — I82432 Acute embolism and thrombosis of left popliteal vein: Secondary | ICD-10-CM | POA: Diagnosis present

## 2023-02-06 DIAGNOSIS — J849 Interstitial pulmonary disease, unspecified: Secondary | ICD-10-CM | POA: Diagnosis present

## 2023-02-06 DIAGNOSIS — Z888 Allergy status to other drugs, medicaments and biological substances status: Secondary | ICD-10-CM

## 2023-02-06 DIAGNOSIS — R531 Weakness: Secondary | ICD-10-CM | POA: Diagnosis not present

## 2023-02-06 DIAGNOSIS — Z8249 Family history of ischemic heart disease and other diseases of the circulatory system: Secondary | ICD-10-CM | POA: Diagnosis not present

## 2023-02-06 DIAGNOSIS — R413 Other amnesia: Secondary | ICD-10-CM | POA: Diagnosis present

## 2023-02-06 DIAGNOSIS — I82442 Acute embolism and thrombosis of left tibial vein: Secondary | ICD-10-CM | POA: Diagnosis present

## 2023-02-06 DIAGNOSIS — I10 Essential (primary) hypertension: Secondary | ICD-10-CM | POA: Diagnosis not present

## 2023-02-06 DIAGNOSIS — Z87891 Personal history of nicotine dependence: Secondary | ICD-10-CM | POA: Diagnosis not present

## 2023-02-06 DIAGNOSIS — R001 Bradycardia, unspecified: Secondary | ICD-10-CM | POA: Diagnosis present

## 2023-02-06 DIAGNOSIS — Z87440 Personal history of urinary (tract) infections: Secondary | ICD-10-CM | POA: Diagnosis not present

## 2023-02-06 DIAGNOSIS — Z7901 Long term (current) use of anticoagulants: Secondary | ICD-10-CM

## 2023-02-06 DIAGNOSIS — G4700 Insomnia, unspecified: Secondary | ICD-10-CM | POA: Diagnosis present

## 2023-02-06 DIAGNOSIS — I824Y9 Acute embolism and thrombosis of unspecified deep veins of unspecified proximal lower extremity: Secondary | ICD-10-CM | POA: Diagnosis not present

## 2023-02-06 DIAGNOSIS — Z9981 Dependence on supplemental oxygen: Secondary | ICD-10-CM | POA: Diagnosis not present

## 2023-02-06 DIAGNOSIS — Z9841 Cataract extraction status, right eye: Secondary | ICD-10-CM

## 2023-02-06 DIAGNOSIS — E1122 Type 2 diabetes mellitus with diabetic chronic kidney disease: Secondary | ICD-10-CM | POA: Diagnosis present

## 2023-02-06 DIAGNOSIS — I080 Rheumatic disorders of both mitral and aortic valves: Secondary | ICD-10-CM | POA: Diagnosis present

## 2023-02-06 DIAGNOSIS — D72829 Elevated white blood cell count, unspecified: Secondary | ICD-10-CM | POA: Diagnosis present

## 2023-02-06 DIAGNOSIS — N289 Disorder of kidney and ureter, unspecified: Secondary | ICD-10-CM | POA: Diagnosis not present

## 2023-02-06 DIAGNOSIS — Z7401 Bed confinement status: Secondary | ICD-10-CM | POA: Diagnosis not present

## 2023-02-06 DIAGNOSIS — N390 Urinary tract infection, site not specified: Secondary | ICD-10-CM | POA: Diagnosis present

## 2023-02-06 DIAGNOSIS — M7989 Other specified soft tissue disorders: Secondary | ICD-10-CM | POA: Diagnosis not present

## 2023-02-06 DIAGNOSIS — I82812 Embolism and thrombosis of superficial veins of left lower extremities: Secondary | ICD-10-CM | POA: Diagnosis present

## 2023-02-06 DIAGNOSIS — Z961 Presence of intraocular lens: Secondary | ICD-10-CM | POA: Diagnosis present

## 2023-02-06 DIAGNOSIS — Z96641 Presence of right artificial hip joint: Secondary | ICD-10-CM | POA: Diagnosis present

## 2023-02-06 DIAGNOSIS — I82412 Acute embolism and thrombosis of left femoral vein: Secondary | ICD-10-CM | POA: Diagnosis present

## 2023-02-06 DIAGNOSIS — M810 Age-related osteoporosis without current pathological fracture: Secondary | ICD-10-CM | POA: Diagnosis present

## 2023-02-06 DIAGNOSIS — Z7984 Long term (current) use of oral hypoglycemic drugs: Secondary | ICD-10-CM | POA: Diagnosis not present

## 2023-02-06 DIAGNOSIS — Z9842 Cataract extraction status, left eye: Secondary | ICD-10-CM

## 2023-02-06 DIAGNOSIS — Z8701 Personal history of pneumonia (recurrent): Secondary | ICD-10-CM

## 2023-02-06 DIAGNOSIS — I82409 Acute embolism and thrombosis of unspecified deep veins of unspecified lower extremity: Secondary | ICD-10-CM | POA: Diagnosis present

## 2023-02-06 DIAGNOSIS — I5032 Chronic diastolic (congestive) heart failure: Secondary | ICD-10-CM | POA: Diagnosis present

## 2023-02-06 NOTE — ED Provider Notes (Incomplete)
[place] EMERGENCY DEPARTMENT AT [facility] Provider Note   CSN: [id number] Arrival date & time: 02/06/23  2227     History {Add pertinent medical, surgical, social history, OB history to HPI:1} Chief Complaint  Patient presents with  . Leg Swelling         Kelsey Salinas is a 89 y.o. female.  HPI     Home Medications Prior to Admission medications   Medication Sig Start Date End Date Taking? Authorizing Provider  acetaminophen (TYLENOL) 500 MG tablet Take 500 mg by mouth daily.    [provider]  albuterol (VENTOLIN HFA) 108 (90 Base) MCG/ACT inhaler Inhale 2 puffs into the lungs every 6 (six) hours as needed for wheezing or shortness of breath. 01/22/23   Regalado, Belkys A, MD  atorvastatin (LIPITOR) 40 MG tablet TAKE 1 TABLET ONCE DAILY. 02/26/22   [name], MD  Cholecalciferol 1.25 MG (50000 UT) capsule Take 50,000 Units by mouth daily.    [provider]  diclofenac Sodium (VOLTAREN) 1 % GEL Apply 2 g topically 4 (four) times daily as needed (arthritis pain).    [provider]  donepezil (ARICEPT) 10 MG tablet TAKE ONE TABLET BY MOUTH AT BEDTIME 04/26/22   [name], MD  empagliflozin ([name]) 10 MG TABS tablet Take 1 tablet (10 mg total) by mouth daily before breakfast. 10/18/22   [name], MD  famotidine (PEPCID) 20 MG tablet Take 20 mg by mouth daily.    [provider]  fexofenadine (ALLEGRA) 180 MG tablet Take 1 tablet (180 mg total) by mouth daily. 06/02/20   Fletcher Anon, NP  Fluticasone-Umeclidin-Vilant (TRELEGY ELLIPTA) 200-62.5-25 MCG/ACT AEPB Inhale 1 puff into the lungs daily. 01/22/23   Regalado, Jon Billings A, MD  furosemide (LASIX) 40 MG tablet Take 1 tablet (40 mg total) by mouth every Monday, Wednesday, and Friday. 01/24/23   Regalado, Belkys A, MD  guaiFENesin (MUCINEX) 600 MG 12 hr tablet Take 1 tablet (600 mg total) by mouth 2 (two) times daily. 01/22/23   Regalado, Belkys A, MD   levalbuterol (XOPENEX) 0.31 MG/3ML nebulizer solution Take 3 mLs (0.31 mg total) by nebulization 2 (two) times daily for 3 days. 02/01/23 02/04/23  Fargo, Amy E, NP  losartan (COZAAR) 100 MG tablet Take 100 mg by mouth daily.    [provider]  memantine (NAMENDA) 10 MG tablet TAKE 1 TABLET BY MOUTH TWICE DAILY. 11/16/22   [name], MD  metoprolol succinate (TOPROL-XL) 100 MG 24 hr tablet TAKE 1 AND 1/2 TABLETS BY MOUTH IN THE MORNING AND AT BEDTIME 07/19/22   Jodelle Red, MD  [facility] powder Apply 1 Application topically 2 (two) times daily. 01/27/23   [provider]  polyethylene glycol (MIRALAX / GLYCOLAX) 17 g packet Take 17 g by mouth daily.    [provider]  potassium chloride SA (KLOR-CON M) 20 MEQ tablet Take 1 tablet (20 mEq total) by mouth once as needed for up to 1 dose. Take it if takes Lasix 2/week Patient taking differently: Take 20 mEq by mouth. 20 mEq, oral, Once A Day on Mon, Wed, Fri, Take 20 mEq po 3 times weekly on M,W,F with Lasix. 01/18/23   [name], MD  sodium chloride (OCEAN) 0.65 % SOLN nasal spray Place 1 spray into both nostrils as needed for congestion.    [provider]  triamcinolone (NASACORT) 55 MCG/ACT AERO nasal inhaler Place 2 sprays into the nose daily.  [provider]  vitamin B-12 (CYANOCOBALAMIN) 1000 MCG tablet Take 1 tablet (1,000 mcg total) by mouth daily. 12/16/17   Reed, Tiffany L, DO      Allergies    Trimethoprim, Amlodipine besylate, Lisinopril, and Sulfamethoxazole-trimethoprim    Review of Systems   Review of Systems  Physical Exam Updated Vital Signs BP (!) 128/55   Pulse (!) 51   Temp (!) 97.3 F (36.3 C) (Oral)   Resp 12   Ht 5\' 8"  (1.727 m)   Wt 72.9 kg   SpO2 100%   BMI 24.45 kg/m  Physical Exam  ED Results / Procedures / Treatments   Labs (all labs ordered are listed, but only abnormal results are displayed) Labs Reviewed - No data to  display  EKG None  Radiology No results found.  Procedures Procedures  {Document cardiac monitor, telemetry assessment procedure when appropriate:1}  Medications Ordered in ED Medications - No data to display  ED Course/ Medical Decision Making/ A&P   {   Click here for ABCD2, HEART and other calculatorsREFRESH Note before signing :1}                              Medical Decision Making  ***  {Document critical care time when appropriate:1} {Document review of labs and clinical decision tools ie heart score, Chads2Vasc2 etc:1}  {Document your independent review of radiology images, and any outside records:1} {Document your discussion with family members, caretakers, and with consultants:1} {Document social determinants of health affecting pt's care:1} {Document your decision making why or why not admission, treatments were needed:1} Final Clinical Impression(s) / ED Diagnoses Final diagnoses:  None    Rx / DC Orders ED Discharge Orders     None

## 2023-02-06 NOTE — Telephone Encounter (Signed)
Kelsey Salinas:  Staff reported the patient has gained about #5Ibs in 1-2days, more swelling in the left leg, denied pain.recommend to get venous US LLE, extra Furosemide 40mg  stat, see provider in morning.

## 2023-02-06 NOTE — ED Provider Notes (Signed)
[place] EMERGENCY DEPARTMENT AT [facility] Provider Note   CSN: [id number] Arrival date & time: 02/06/23  2227     History  Chief Complaint  Patient presents with   Leg Swelling         Kelsey Salinas is a 89 y.o. female who presents from [facility] assisted [facility] with concern for LLE swelling. Patient is accompanied by facility CNA. Per CNA patient was previously in independent living, recently moved to rehab secondary to deconditioning in the context of PNA s/p oral antibiotics.  Collateral history provided by facility RN over the phone.  Facility RN noted LLE increased from baseline tonight.  RN states that she notified on-call provider who recommended secondary dose of Lasix this evening and vascular Doppler ultrasound tomorrow to assess for for DVT.  Facility RN states that she was also asked to send a photo of the patient's edematous leg to her primary physician who upon receipt of the photo directed the patient to the emergency department for stat Doppler, presumably to rule out DVT.  Patient with little insight into her presentation, per chart review some baseline memory deficits, per CNA at bedside, patient is at her baseline.  Patient denies chest pain shortness of breath, calf pain, or acute symptom.  History of type 2 diabetes with CKD, diastolic heart failure, no anticoagulation.  HPI     Home Medications Prior to Admission medications   Medication Sig Start Date End Date Taking? Authorizing Provider  acetaminophen (TYLENOL) 500 MG tablet Take 500 mg by mouth daily.    [provider]  albuterol (VENTOLIN HFA) 108 (90 Base) MCG/ACT inhaler Inhale 2 puffs into the lungs every 6 (six) hours as needed for wheezing or shortness of breath. 01/22/23   Regalado, Belkys A, MD  atorvastatin (LIPITOR) 40 MG tablet TAKE 1 TABLET ONCE DAILY. 02/26/22   [name], MD  Cholecalciferol 1.25 MG (50000 UT) capsule Take 50,000 Units by mouth daily.     [provider]  diclofenac Sodium (VOLTAREN) 1 % GEL Apply 2 g topically 4 (four) times daily as needed (arthritis pain).    [provider]  donepezil (ARICEPT) 10 MG tablet TAKE ONE TABLET BY MOUTH AT BEDTIME 04/26/22   [name], MD  empagliflozin ([name]) 10 MG TABS tablet Take 1 tablet (10 mg total) by mouth daily before breakfast. 10/18/22   [name], MD  famotidine (PEPCID) 20 MG tablet Take 20 mg by mouth daily.    [provider]  fexofenadine (ALLEGRA) 180 MG tablet Take 1 tablet (180 mg total) by mouth daily. 06/02/20   Fletcher Anon, NP  Fluticasone-Umeclidin-Vilant (TRELEGY ELLIPTA) 200-62.5-25 MCG/ACT AEPB Inhale 1 puff into the lungs daily. 01/22/23   Regalado, Jon Billings A, MD  furosemide (LASIX) 40 MG tablet Take 1 tablet (40 mg total) by mouth every Monday, Wednesday, and Friday. 01/24/23   Regalado, Belkys A, MD  guaiFENesin (MUCINEX) 600 MG 12 hr tablet Take 1 tablet (600 mg total) by mouth 2 (two) times daily. 01/22/23   Regalado, Belkys A, MD  levalbuterol (XOPENEX) 0.31 MG/3ML nebulizer solution Take 3 mLs (0.31 mg total) by nebulization 2 (two) times daily for 3 days. 02/01/23 02/04/23  Fargo, Amy E, NP  losartan (COZAAR) 100 MG tablet Take 100 mg by mouth daily.    [provider]  memantine (NAMENDA) 10 MG tablet TAKE 1 TABLET BY MOUTH TWICE DAILY. 11/16/22   [name], MD  metoprolol succinate (TOPROL-XL) 100  MG 24 hr tablet TAKE 1 AND 1/2 TABLETS BY MOUTH IN THE MORNING AND AT BEDTIME 07/19/22   Jodelle Red, MD  [facility] powder Apply 1 Application topically 2 (two) times daily. 01/27/23   [provider]  polyethylene glycol (MIRALAX / GLYCOLAX) 17 g packet Take 17 g by mouth daily.    [provider]  potassium chloride SA (KLOR-CON M) 20 MEQ tablet Take 1 tablet (20 mEq total) by mouth once as needed for up to 1 dose. Take it if takes Lasix 2/week Patient taking differently: Take 20 mEq by mouth. 20  mEq, oral, Once A Day on Mon, Wed, Fri, Take 20 mEq po 3 times weekly on M,W,F with Lasix. 01/18/23   [name], MD  sodium chloride (OCEAN) 0.65 % SOLN nasal spray Place 1 spray into both nostrils as needed for congestion.    [provider]  triamcinolone (NASACORT) 55 MCG/ACT AERO nasal inhaler Place 2 sprays into the nose daily.    [provider]  vitamin B-12 (CYANOCOBALAMIN) 1000 MCG tablet Take 1 tablet (1,000 mcg total) by mouth daily. 12/16/17   Reed, Tiffany L, DO      Allergies    Trimethoprim, Amlodipine besylate, Lisinopril, and Sulfamethoxazole-trimethoprim    Review of Systems   Review of Systems  Unable to perform ROS: Dementia  Cardiovascular:  Positive for leg swelling.    Physical Exam Updated Vital Signs BP (!) 117/52   Pulse (!) 56   Temp (!) 97.4 F (36.3 C) (Axillary)   Resp 13   Ht 5\' 8"  (1.727 m)   Wt 72.9 kg   SpO2 99%   BMI 24.45 kg/m  Physical Exam Vitals and nursing note reviewed.  Constitutional:      Appearance: She is not ill-appearing or toxic-appearing.  HENT:     Head: Normocephalic and atraumatic.     Mouth/Throat:     Mouth: Mucous membranes are moist.     Pharynx: No oropharyngeal exudate or posterior oropharyngeal erythema.  Eyes:     General:        Right eye: No discharge.        Left eye: No discharge.     Conjunctiva/sclera: Conjunctivae normal.  Cardiovascular:     Rate and Rhythm: Normal rate and regular rhythm.     Pulses:          Dorsalis pedis pulses are 1+ on the left side.     Comments: No calf TTP, negative homan's sign Pulmonary:     Effort: Pulmonary effort is normal. No respiratory distress.     Breath sounds: Normal breath sounds. No wheezing or rales.  Abdominal:     General: Bowel sounds are normal. There is no distension.     Palpations: Abdomen is soft.     Tenderness: There is no abdominal tenderness. There is no guarding or rebound.  Musculoskeletal:        General: No deformity.      Cervical back: Neck supple.     Right lower leg: 1+ Edema present.     Left lower leg: 3+ Pitting Edema present.  Skin:    General: Skin is warm and dry.     Capillary Refill: Capillary refill takes less than 2 seconds.  Neurological:     Mental Status: She is alert. Mental status is at baseline.  Psychiatric:        Mood and Affect: Mood normal.     ED Results / Procedures / Treatments  Labs (all labs ordered are listed, but only abnormal results are displayed) Labs Reviewed  CBC - Abnormal; Notable for the following components:      Result Value   WBC 13.4 (*)    HCT 46.2 (*)    All other components within normal limits  BASIC METABOLIC PANEL - Abnormal; Notable for the following components:   CO2 19 (*)    Glucose, Bld 151 (*)    BUN 55 (*)    Creatinine, Ser 1.56 (*)    Calcium 8.5 (*)    GFR, Estimated 31 (*)    All other components within normal limits  BRAIN NATRIURETIC PEPTIDE - Abnormal; Notable for the following components:   B Natriuretic Peptide 125.6 (*)    All other components within normal limits  TROPONIN I (HIGH SENSITIVITY) - Abnormal; Notable for the following components:   Troponin I (High Sensitivity) 26 (*)    All other components within normal limits  TROPONIN I (HIGH SENSITIVITY) - Abnormal; Notable for the following components:   Troponin I (High Sensitivity) 26 (*)    All other components within normal limits    EKG EKG Interpretation Date/Time:  Monday February 07 2023 00:16:05 EST Ventricular Rate:  59 PR Interval:  185 QRS Duration:  109 QT Interval:  443 QTC Calculation: 439 R Axis:   -16  Text Interpretation: Sinus rhythm RSR' in V1 or V2, probably normal variant Left ventricular hypertrophy Nonspecific T abnormalities, inferior leads Nonspecific T wave abnormality Confirmed by Glynn Octave [phone]) on 02/07/2023 12:22:06 AM  Radiology No results found.  Procedures Procedures    Medications Ordered in ED Medications -  No data to display  ED Course/ Medical Decision Making/ A&P                                 Medical Decision Making 89 year old female with left lower extremity edema.  BP normal, heart rate in the 50s, chart review based on appear to be in the 60-70s.  She is on metoprolol bilateral lower extremity edema L>R, with pitting edema to the left leg up to the knee, palpable DP pulse on left foot.  DDx includes but is not limited to DVT, chronic venous stasis, peripheral artery disease, CHF, dependent edema.  Amount and/or Complexity of Data Reviewed Labs: ordered.    Details: CBC with mild leukocytosis of 13,000 BMP with creatinine of 1.5, elevated BUN to 55.  BNP of 125.  Troponin of 26 near patient's baseline.  Echocardiogram in 01/21/2023 with normal ejection fraction.   Patient laboratory studies at baseline, she has been hemodynamically stable throughout her stay in the ED and is asymptomatic.  She boarded overnight in the emergency department without issue, pending Doppler at time of shift change.  Care of this patient signed out to oncoming ED provider J. Barrett, PA-C at time of shift change. All pertinent HPI, physical exam, and laboratory findings were discussed with them prior to my departure. Disposition of patient pending completion of workup, reevaluation, and clinical judgement of oncoming ED provider.   This chart was dictated using voice recognition software, Dragon. Despite the best efforts of this provider to proofread and correct errors, errors may still occur which can change documentation meaning.          Final Clinical Impression(s) / ED Diagnoses Final diagnoses:  None    Rx / DC Orders ED Discharge Orders     None  Paris Lore, PA-C 02/07/23 1610    Rexford Maus, DO 02/09/23 1558

## 2023-02-06 NOTE — ED Triage Notes (Addendum)
Pt BIB EMS from [facility]. EMS reports pt was sent by dr d/t L leg swelling and wants doppler study. Denies any injury/trauma . A&Ox4  Ems VS 130/84, HR 70, 95% 3L  (baseline), cbg 198

## 2023-02-07 ENCOUNTER — Emergency Department (HOSPITAL_BASED_OUTPATIENT_CLINIC_OR_DEPARTMENT_OTHER): Payer: Medicare Other

## 2023-02-07 ENCOUNTER — Encounter (HOSPITAL_COMMUNITY): Payer: Self-pay | Admitting: Internal Medicine

## 2023-02-07 DIAGNOSIS — E7800 Pure hypercholesterolemia, unspecified: Secondary | ICD-10-CM

## 2023-02-07 DIAGNOSIS — N390 Urinary tract infection, site not specified: Secondary | ICD-10-CM | POA: Diagnosis not present

## 2023-02-07 DIAGNOSIS — I824Y2 Acute embolism and thrombosis of unspecified deep veins of left proximal lower extremity: Secondary | ICD-10-CM

## 2023-02-07 DIAGNOSIS — I10 Essential (primary) hypertension: Secondary | ICD-10-CM

## 2023-02-07 DIAGNOSIS — Z87440 Personal history of urinary (tract) infections: Secondary | ICD-10-CM | POA: Diagnosis present

## 2023-02-07 DIAGNOSIS — M7989 Other specified soft tissue disorders: Secondary | ICD-10-CM

## 2023-02-07 DIAGNOSIS — I824Y9 Acute embolism and thrombosis of unspecified deep veins of unspecified proximal lower extremity: Secondary | ICD-10-CM | POA: Diagnosis present

## 2023-02-07 DIAGNOSIS — R413 Other amnesia: Secondary | ICD-10-CM

## 2023-02-07 DIAGNOSIS — J9601 Acute respiratory failure with hypoxia: Secondary | ICD-10-CM | POA: Diagnosis present

## 2023-02-07 DIAGNOSIS — Z8701 Personal history of pneumonia (recurrent): Secondary | ICD-10-CM

## 2023-02-07 DIAGNOSIS — N189 Chronic kidney disease, unspecified: Secondary | ICD-10-CM

## 2023-02-07 DIAGNOSIS — F0390 Unspecified dementia without behavioral disturbance: Secondary | ICD-10-CM | POA: Insufficient documentation

## 2023-02-07 DIAGNOSIS — R7989 Other specified abnormal findings of blood chemistry: Secondary | ICD-10-CM | POA: Diagnosis not present

## 2023-02-07 DIAGNOSIS — I5032 Chronic diastolic (congestive) heart failure: Secondary | ICD-10-CM | POA: Diagnosis not present

## 2023-02-07 DIAGNOSIS — D72825 Bandemia: Secondary | ICD-10-CM | POA: Diagnosis not present

## 2023-02-07 LAB — BASIC METABOLIC PANEL
Anion gap: 13 (ref 5–15)
BUN: 55 mg/dL — ABNORMAL HIGH (ref 8–23)
CO2: 19 mmol/L — ABNORMAL LOW (ref 22–32)
Calcium: 8.5 mg/dL — ABNORMAL LOW (ref 8.9–10.3)
Chloride: 105 mmol/L (ref 98–111)
Creatinine, Ser: 1.56 mg/dL — ABNORMAL HIGH (ref 0.44–1.00)
GFR, Estimated: 31 mL/min — ABNORMAL LOW (ref >60)
Glucose, Bld: 151 mg/dL — ABNORMAL HIGH (ref 70–99)
Potassium: 4.1 mmol/L (ref 3.5–5.1)
Sodium: 137 mmol/L (ref 135–145)

## 2023-02-07 LAB — CBC
HCT: 46.2 % — ABNORMAL HIGH (ref 36.0–46.0)
Hemoglobin: 14.2 g/dL (ref 12.0–15.0)
MCH: 30.2 pg (ref 26.0–34.0)
MCHC: 30.7 g/dL (ref 30.0–36.0)
MCV: 98.3 fL (ref 80.0–100.0)
Platelets: 164 10*3/uL (ref 150–400)
RBC: 4.7 MIL/uL (ref 3.87–5.11)
RDW: 14.3 % (ref 11.5–15.5)
WBC: 13.4 10*3/uL — ABNORMAL HIGH (ref 4.0–10.5)
nRBC: 0 % (ref 0.0–0.2)

## 2023-02-07 LAB — URINALYSIS, ROUTINE W REFLEX MICROSCOPIC
Bilirubin Urine: NEGATIVE
Glucose, UA: >=500 mg/dL — AB
Hgb urine dipstick: NEGATIVE
Ketones, ur: NEGATIVE mg/dL
Nitrite: NEGATIVE
Protein, ur: NEGATIVE mg/dL
Specific Gravity, Urine: 1.018 (ref 1.005–1.030)
pH: 5 (ref 5.0–8.0)

## 2023-02-07 LAB — TROPONIN I (HIGH SENSITIVITY)
Troponin I (High Sensitivity): 26 ng/L — ABNORMAL HIGH (ref <18)
Troponin I (High Sensitivity): 26 ng/L — ABNORMAL HIGH (ref <18)

## 2023-02-07 LAB — BRAIN NATRIURETIC PEPTIDE: B Natriuretic Peptide: 125.6 pg/mL — ABNORMAL HIGH (ref 0.0–100.0)

## 2023-02-07 MED ORDER — TRIAMCINOLONE ACETONIDE 55 MCG/ACT NA AERO
2.0000 | INHALATION_SPRAY | Freq: Every day | NASAL | Status: DC
  Administered 2023-02-08: 2 via NASAL
  Filled 2023-02-07: qty 21.6

## 2023-02-07 MED ORDER — DICLOFENAC SODIUM 1 % EX GEL
2.0000 g | Freq: Four times a day (QID) | CUTANEOUS | Status: DC | PRN
Start: 2023-02-07 — End: 2023-02-09

## 2023-02-07 MED ORDER — APIXABAN 5 MG PO TABS
10.0000 mg | ORAL_TABLET | Freq: Two times a day (BID) | ORAL | Status: DC
  Administered 2023-02-07 – 2023-02-09 (×5): 10 mg via ORAL
  Filled 2023-02-07 (×5): qty 2

## 2023-02-07 MED ORDER — LOSARTAN POTASSIUM 50 MG PO TABS
100.0000 mg | ORAL_TABLET | Freq: Every morning | ORAL | Status: DC
  Administered 2023-02-08: 100 mg via ORAL
  Filled 2023-02-07: qty 2

## 2023-02-07 MED ORDER — HEPARIN BOLUS VIA INFUSION
4000.0000 [IU] | Freq: Once | INTRAVENOUS | Status: DC
Start: 2023-02-07 — End: 2023-02-07
  Filled 2023-02-07: qty 4000

## 2023-02-07 MED ORDER — FLUTICASONE FUROATE-VILANTEROL 200-25 MCG/ACT IN AEPB
1.0000 | INHALATION_SPRAY | Freq: Every day | RESPIRATORY_TRACT | Status: DC
  Administered 2023-02-08 – 2023-02-09 (×2): 1 via RESPIRATORY_TRACT
  Filled 2023-02-07: qty 28

## 2023-02-07 MED ORDER — HEPARIN (PORCINE) 25000 UT/250ML-% IV SOLN
1200.0000 [IU]/h | INTRAVENOUS | Status: DC
Start: 2023-02-07 — End: 2023-02-07

## 2023-02-07 MED ORDER — POLYETHYLENE GLYCOL 3350 17 G PO PACK
17.0000 g | PACK | Freq: Every day | ORAL | Status: DC
  Administered 2023-02-08 – 2023-02-09 (×2): 17 g via ORAL
  Filled 2023-02-07 (×3): qty 1

## 2023-02-07 MED ORDER — APIXABAN 5 MG PO TABS: 5.0000 mg | ORAL_TABLET | Freq: Two times a day (BID) | ORAL | Status: DC

## 2023-02-07 MED ORDER — FUROSEMIDE 40 MG PO TABS
40.0000 mg | ORAL_TABLET | ORAL | Status: DC
  Administered 2023-02-07: 40 mg via ORAL
  Filled 2023-02-07: qty 2

## 2023-02-07 MED ORDER — GUAIFENESIN ER 600 MG PO TB12
600.0000 mg | ORAL_TABLET | Freq: Two times a day (BID) | ORAL | Status: DC
  Administered 2023-02-07 – 2023-02-09 (×4): 600 mg via ORAL
  Filled 2023-02-07 (×4): qty 1

## 2023-02-07 MED ORDER — ACETAMINOPHEN 325 MG PO TABS: 650.0000 mg | ORAL_TABLET | Freq: Four times a day (QID) | ORAL | Status: DC | PRN

## 2023-02-07 MED ORDER — FAMOTIDINE 20 MG PO TABS
20.0000 mg | ORAL_TABLET | ORAL | Status: DC
  Administered 2023-02-07 – 2023-02-09 (×2): 20 mg via ORAL
  Filled 2023-02-07 (×2): qty 1

## 2023-02-07 MED ORDER — SODIUM CHLORIDE 0.9 % IV SOLN: INTRAVENOUS | Status: DC

## 2023-02-07 MED ORDER — EMPAGLIFLOZIN 10 MG PO TABS
10.0000 mg | ORAL_TABLET | Freq: Every day | ORAL | Status: DC
  Filled 2023-02-07: qty 1

## 2023-02-07 MED ORDER — POTASSIUM CHLORIDE CRYS ER 20 MEQ PO TBCR: 20.0000 meq | EXTENDED_RELEASE_TABLET | ORAL | Status: DC

## 2023-02-07 MED ORDER — SODIUM CHLORIDE 0.9 % IV SOLN: 2.0000 g | INTRAVENOUS | Status: DC

## 2023-02-07 MED ORDER — ALBUTEROL SULFATE (2.5 MG/3ML) 0.083% IN NEBU: 2.5000 mg | INHALATION_SOLUTION | Freq: Four times a day (QID) | RESPIRATORY_TRACT | Status: DC | PRN

## 2023-02-07 MED ORDER — ATORVASTATIN CALCIUM 40 MG PO TABS
40.0000 mg | ORAL_TABLET | Freq: Every day | ORAL | Status: DC
  Administered 2023-02-07 – 2023-02-09 (×3): 40 mg via ORAL
  Filled 2023-02-07 (×3): qty 1

## 2023-02-07 MED ORDER — SODIUM CHLORIDE 0.9 % IV SOLN
1.0000 g | INTRAVENOUS | Status: DC
  Administered 2023-02-07: 1 g via INTRAVENOUS
  Filled 2023-02-07: qty 10

## 2023-02-07 MED ORDER — ACETAMINOPHEN 650 MG RE SUPP: 650.0000 mg | Freq: Four times a day (QID) | RECTAL | Status: DC | PRN

## 2023-02-07 MED ORDER — ONDANSETRON HCL 4 MG/2ML IJ SOLN: 4.0000 mg | Freq: Four times a day (QID) | INTRAMUSCULAR | Status: DC | PRN

## 2023-02-07 MED ORDER — ONDANSETRON HCL 4 MG PO TABS: 4.0000 mg | ORAL_TABLET | Freq: Four times a day (QID) | ORAL | Status: DC | PRN

## 2023-02-07 MED ORDER — UMECLIDINIUM BROMIDE 62.5 MCG/ACT IN AEPB
1.0000 | INHALATION_SPRAY | Freq: Every day | RESPIRATORY_TRACT | Status: DC
  Administered 2023-02-08 – 2023-02-09 (×2): 1 via RESPIRATORY_TRACT
  Filled 2023-02-07: qty 7

## 2023-02-07 MED ORDER — MEMANTINE HCL 10 MG PO TABS
10.0000 mg | ORAL_TABLET | Freq: Two times a day (BID) | ORAL | Status: DC
  Administered 2023-02-07 – 2023-02-09 (×4): 10 mg via ORAL
  Filled 2023-02-07 (×4): qty 1

## 2023-02-07 MED ORDER — LORATADINE 10 MG PO TABS
10.0000 mg | ORAL_TABLET | Freq: Every day | ORAL | Status: DC
  Administered 2023-02-07 – 2023-02-09 (×3): 10 mg via ORAL
  Filled 2023-02-07 (×3): qty 1

## 2023-02-07 MED ORDER — METOPROLOL SUCCINATE ER 50 MG PO TB24
50.0000 mg | ORAL_TABLET | Freq: Two times a day (BID) | ORAL | Status: DC
  Administered 2023-02-07 – 2023-02-08 (×2): 50 mg via ORAL
  Filled 2023-02-07 (×2): qty 1

## 2023-02-07 MED ORDER — DONEPEZIL HCL 10 MG PO TABS
10.0000 mg | ORAL_TABLET | Freq: Every day | ORAL | Status: DC
  Administered 2023-02-07 – 2023-02-08 (×2): 10 mg via ORAL
  Filled 2023-02-07 (×2): qty 1

## 2023-02-07 NOTE — Progress Notes (Signed)
Lower extremity venous duplex completed. Please see CV Procedures for preliminary results.  Initial findings reported to Keenan Bachelor, RN.  Shona Simpson, RVT 02/07/23 9:23 AM

## 2023-02-07 NOTE — ED Notes (Signed)
Son Onalee Hua [phone] would like an update asap

## 2023-02-07 NOTE — H&P (Addendum)
History and Physical    Patient: Kelsey Salinas SWF:[id number] DOB: 1926-03-05 DOA: 02/06/2023 DOS: the patient was seen and examined on 02/07/2023 PCP: [name], MD  Patient coming from: Transfer from [facility]  Chief Complaint:  Chief Complaint  Patient presents with   Leg Swelling        HPI: Kelsey Salinas is a 89 y.o. female with medical history significant of Alzheimer's dementia, diastolic CHF, ILD, hyperlipidemia, arthritis, osteoporosis presents with progressive left leg swelling.  History is obtained from the patient as well as caregiver at the facility. She just recently had been hospitalized from 1/9-1/13 with acute hypoxic respiratory failure in the setting of pneumonia and diastolic CHF with also concern for a urinary tract infection.  Patient was treated with antibiotics and Lasix with improvement in symptoms.  After discharge she was sent to [facility] for rehab.  She was noted to have leg swelling and plan getting to rehab in the left leg, but seem to be getting worse.  The patient denies any associated leg pain.The patient has been on oxygen therapy since her hospital admission.  Denied having any chest discomfort or shortness of breath. The patient's cough, a chronic issue, has persisted. The patient denies any stomach pain, headaches, bleeding, or other symptoms, but reports feeling hungry.  In the emergency department patient was noted to be afebrile, pulse 51-57, and all vital signs relatively maintained.  Labs significant for WBC 13.4, 55, creatinine 1.56, glucose 151 BNP 125.6, high-sensitivity troponins flat at 26.  Doppler ultrasound of the lower extremities significant for acute left DVT along with acute superficial vein thrombosis.  Patient was initially ordered to be started on a heparin drip.  Review of Systems: As mentioned in the history of present illness. All other systems reviewed and are negative.  Past Medical History:  Diagnosis Date   Abscess  of right leg 12/24/2020   Childhood asthma    Chronic diastolic heart failure (HCC) 12/25/2012   October 2013 echocardiogram 1. There is moderate concentric left ventricle hypertrophy. 2. Left ventricular ejection fraction estimated by 2D at 60-65 percent. 3. There were no regional wall motion abnormalities. 4. Mild mitral annular calcification. 5. Mild mitral valve regurgitation. 6. There is mild tricuspid regurgitation. 7. Mildly elevated estimated right ventricular systolic pressure. 8. Right ventricular systolic pressure estimated at 35-40 mm Hg. 9. Mild calcification of the aortic valve. 10. Trace aortic valve regurgitation. 11. Analysis of mitral valve inflow, pulmonary vein Doppler and tissue Doppler suggests grade I diastolic dysfunction without elevated left atrial pressure.    Colon polyp    Diverticular stricture (HCC) 2009   Diverticulosis 10/30/2007   DM (diabetes mellitus), type 2 (HCC)    Edema 09/12/2013   Fall 05/18/2017   tripped over wheel of walker; superior and inferior pubic ramus fractures    Hypercholesterolemia    Hypertension    Hypertension, essential    Hypothyroidism    as a teenager   Indigestion    Insomnia    OA (osteoarthritis) of hip    right hip replacement, OA in bilateral knees   Osteopenia    Actonel stopped in 2012 after 5years therapy   Pneumonia 1939   "hospitalized"   Rapid resting heart rate 12/25/2012   Uncertain cause. Possible ectopic atrial tachycardia. Date entered 12/25/12    Right rotator cuff tear    Seasonal allergies    Unsteadiness on feet    Past Surgical History:  Procedure Laterality Date   CATARACT EXTRACTION W/  INTRAOCULAR LENS  IMPLANT, BILATERAL Bilateral    COLONOSCOPY  2009   Dr. Melchor Amour   DILATION AND CURETTAGE OF UTERUS     FRACTURE SURGERY     JOINT REPLACEMENT     ORIF WRIST FRACTURE Right    REDUCTION MAMMAPLASTY  1999   TOTAL HIP ARTHROPLASTY Right 03-04-08   Dr. Lequita Halt   Social History:  reports that she  has quit smoking. Her smoking use included cigarettes. She has a 6 pack-year smoking history. She has never used smokeless tobacco. She reports current alcohol use. She reports that she does not use drugs.  Allergies  Allergen Reactions   Trimethoprim     Per matrix   Amlodipine Besylate Other (See Comments)    Edema   Lisinopril Cough    Cough    Sulfamethoxazole-Trimethoprim Other (See Comments)    Unknown reaction     Family History  Problem Relation Age of Onset   Heart disease Mother    Heart disease Father    Heart disease Brother     Prior to Admission medications   Medication Sig Start Date End Date Taking? Authorizing Provider  acetaminophen (TYLENOL) 500 MG tablet Take 500 mg by mouth daily.    [provider]  albuterol (VENTOLIN HFA) 108 (90 Base) MCG/ACT inhaler Inhale 2 puffs into the lungs every 6 (six) hours as needed for wheezing or shortness of breath. 01/22/23   Regalado, Belkys A, MD  atorvastatin (LIPITOR) 40 MG tablet TAKE 1 TABLET ONCE DAILY. 02/26/22   [name], MD  Cholecalciferol 1.25 MG (50000 UT) capsule Take 50,000 Units by mouth daily.    [provider]  diclofenac Sodium (VOLTAREN) 1 % GEL Apply 2 g topically 4 (four) times daily as needed (arthritis pain).    [provider]  donepezil (ARICEPT) 10 MG tablet TAKE ONE TABLET BY MOUTH AT BEDTIME 04/26/22   [name], MD  empagliflozin ([name]) 10 MG TABS tablet Take 1 tablet (10 mg total) by mouth daily before breakfast. 10/18/22   [name], MD  famotidine (PEPCID) 20 MG tablet Take 20 mg by mouth daily.    [provider]  fexofenadine (ALLEGRA) 180 MG tablet Take 1 tablet (180 mg total) by mouth daily. 06/02/20   Fletcher Anon, NP  Fluticasone-Umeclidin-Vilant (TRELEGY ELLIPTA) 200-62.5-25 MCG/ACT AEPB Inhale 1 puff into the lungs daily. 01/22/23   Regalado, Jon Billings A, MD  furosemide (LASIX) 40 MG tablet Take 1 tablet (40 mg total) by mouth every  Monday, Wednesday, and Friday. 01/24/23   Regalado, Belkys A, MD  guaiFENesin (MUCINEX) 600 MG 12 hr tablet Take 1 tablet (600 mg total) by mouth 2 (two) times daily. 01/22/23   Regalado, Belkys A, MD  levalbuterol (XOPENEX) 0.31 MG/3ML nebulizer solution Take 3 mLs (0.31 mg total) by nebulization 2 (two) times daily for 3 days. 02/01/23 02/04/23  Fargo, Amy E, NP  losartan (COZAAR) 100 MG tablet Take 100 mg by mouth daily.    [provider]  memantine (NAMENDA) 10 MG tablet TAKE 1 TABLET BY MOUTH TWICE DAILY. 11/16/22   [name], MD  metoprolol succinate (TOPROL-XL) 100 MG 24 hr tablet TAKE 1 AND 1/2 TABLETS BY MOUTH IN THE MORNING AND AT BEDTIME 07/19/22   Jodelle Red, MD  [facility] powder Apply 1 Application topically 2 (two) times daily. 01/27/23   [provider]  polyethylene glycol (MIRALAX / GLYCOLAX) 17 g packet Take 17 g by mouth daily.  [provider]  potassium chloride SA (KLOR-CON M) 20 MEQ tablet Take 1 tablet (20 mEq total) by mouth once as needed for up to 1 dose. Take it if takes Lasix 2/week Patient taking differently: Take 20 mEq by mouth. 20 mEq, oral, Once A Day on Mon, Wed, Fri, Take 20 mEq po 3 times weekly on M,W,F with Lasix. 01/18/23   [name], MD  sodium chloride (OCEAN) 0.65 % SOLN nasal spray Place 1 spray into both nostrils as needed for congestion.    [provider]  triamcinolone (NASACORT) 55 MCG/ACT AERO nasal inhaler Place 2 sprays into the nose daily.    [provider]  vitamin B-12 (CYANOCOBALAMIN) 1000 MCG tablet Take 1 tablet (1,000 mcg total) by mouth daily. 12/16/17   Kermit Balo, DO    Physical Exam: Vitals:   02/07/23 0445 02/07/23 0500 02/07/23 0745 02/07/23 0800  BP: (!) 120/52 (!) 117/52  (!) 121/50  Pulse: (!) 52 (!) 56  (!) 52  Resp: 18 13  19   Temp:   97.9 F (36.6 C)   TempSrc:   Axillary   SpO2: 100% 99%  99%  Weight:      Height:         Constitutional: Elderly female  who appears to be in no acute distress Eyes: PERRL, lids and conjunctivae normal ENMT: Mucous membranes are moist. Posterior pharynx clear of any exudate or lesions. Hard of hearing. Neck: normal, supple.  No JVD. Respiratory: clear to auscultation bilaterally, no wheezing, no crackles. Normal respiratory effort. No accessory muscle use.  Patient currently on 2 L nasal cannula oxygen.  Able to talk in fairly complete sentences does intermittently have a cough. Cardiovascular: Regular rate and rhythm, no murmurs / rubs / gallops.  Left leg swelling present with left leg being twice the size of the right leg.  2+ pedal pulses.   Abdomen: no tenderness, no masses palpated.  . Bowel sounds positive.  Musculoskeletal: no clubbing / cyanosis. No joint deformity upper and lower extremities. Good ROM, no contractures. Normal muscle tone.  Skin: no rashes, lesions, ulcers. No induration Neurologic: CN 2-12 grossly intact.   Strength 5/5 in all 4.  Psychiatric: Alert and oriented to person and place.  Patient does repeat similar questions during the evaluation to suggest short-term memory loss.  Data Reviewed:  EKG revealed sinus rhythm at 59 bpm with left ventricle hypertrophy.  Reviewed labs, imaging, and pertinent records as documented.  Assessment and Plan:  DVT Acute.  Patient presented with complaints of left leg swelling.  Doppler ultrasound of the left lower extremity revealed findings consistent with left common femoral vein, SF junction, left femoral vein, left proximal  profunda vein, left popliteal vein, acute superficial vein thrombosis involving the left great saphenous vein, and age indeterminate deep vein thrombosis involving the left posterior tibial veins.  Initially orders were placed for patient to be started on a heparin drip. -Admit to medical telemetry bed -Strict bedrest -Discontinue heparin drip and start Eliquis  -Orders placed for knee-high compression stocking  Urine tract  infection History of UTI Prior to arrival.  During last hospitalization 1/9-1/13 patient was noted to have a E. coli UTI that was pansensitive.  She had received treatment at that time.  However urinalysis today was positive for moderate leukocytes, rare bacteria, and 21-50 WBCs. -Check urine culture -Rocephin IV  Leukocytosis Acute.  WBC elevated at 13.4.  Possibly secondary to above. -Recheck CBC in a.m.  Elevated  troponin Acute.  High-sensitivity troponin 26 and flat.  Patient denies any complaints of chest pain.  Echocardiogram just recently performed noted stable EF. -Continue monitor  Diastolic CHF Chronic.  Patient appears to be euvolemic at this time.  BNP 125.6 which is significantly improved from when checked on 1/12 and noted to be 1420.7.  Last echocardiogram noted EF to be 60 to 65% with grade 1 diastolic dysfunction when checked on 01/21/2023. -Strict I&O's and daily weights -Continue current medication regimen  Acute on chronic renal insufficiency Creatinine noted to be elevated at 1.56 with BUN 55.  Elevated BUN to creatinine ratio suggest prerenal cause of symptoms.  Creatinine previously had been 1.36 when checked on 01/24/2023.   -Check urinalysis -Normal saline IV fluids at 50 mL/h for total 500 mL of fluid -Recheck kidney function in a.m.  History of pulmonary fibrosis History of pneumonia Respiratory failure with hypoxia Patient with a history of recent right-sided pneumonia completed course of antibiotics.  Has a history of interstitial lung disease progression of disease noted on noncontrasted CT scan of the chest obtained on 1/9. -Continue nasal cannula oxygen -Continue pharmacy substitution for home inhalers  Essential hypertension -Continue home blood pressure regimen with holding parameters  Hyperlipidemia -Continue atorvastatin  Memory loss, short-term -Delirium precautions -Continue donepezil and Namenda    DVT prophylaxis: Eliquis [facility]  Planning:   Code Status: Full Code    Consults: None  Family Communication: Son and other family updated at bedside  Severity of Illness: The appropriate patient status for this patient is OBSERVATION. Observation status is judged to be reasonable and necessary in order to provide the required intensity of service to ensure the patient's safety. The patient's presenting symptoms, physical exam findings, and initial radiographic and laboratory data in the context of their medical condition is felt to place them at decreased risk for further clinical deterioration. Furthermore, it is anticipated that the patient will be medically stable for discharge from the hospital within 2 midnights of admission.   Author: Clydie Braun, MD 02/07/2023 10:25 AM  For on call review [email or web address].

## 2023-02-07 NOTE — ED Provider Notes (Signed)
  Physical Exam  BP (!) 117/52   Pulse (!) 56   Temp (!) 97.4 F (36.3 C) (Axillary)   Resp 13   Ht 5\' 8"  (1.727 m)   Wt 72.9 kg   SpO2 99%   BMI 24.45 kg/m   Physical Exam Constitutional:      Appearance: Normal appearance.  Cardiovascular:     Rate and Rhythm: Regular rhythm. Bradycardia present.  Pulmonary:     Effort: Pulmonary effort is normal.     Breath sounds: Normal breath sounds.  Abdominal:     General: Abdomen is flat. Bowel sounds are normal.     Palpations: Abdomen is soft.  Musculoskeletal:     Right lower leg: No edema.     Left lower leg: Edema present.  Neurological:     Mental Status: She is alert.     Procedures  Procedures  ED Course / MDM   Clinical Course as of 02/07/23 1006  Mon Feb 07, 2023  0934 VAS [place] LOWER EXTREMITY VENOUS (DVT) (ONLY [facility] & [facility]) [JB]    Clinical Course User Index [JB] Inaara Tye, Horald Chestnut, PA-C   Medical Decision Making Amount and/or Complexity of Data Reviewed Labs: ordered. Radiology:  Decision-making details documented in ED Course.  Risk Decision regarding hospitalization.   Received in sign off. Patient with DM, CKD, CHF, dementia presenting with left lower extremity edema, see prior providers note for HPI, in short, otherwise patient acting at baseline and labs are at baseline. Needing DVT study.  Patient does report some shortness of breath and cough however she is just getting over a pneumonia.  Does not require oxygen at baseline however since having pneumonia is required 2-4L [place].  DVT does show large proximal DVT.  Will consult pharmacy for anticoagulation would like to admit in the setting of multiple comorbidities as well as significant renal dysfunction will also consult for admission.   Spoke to admitting team who agreed to see patient for admission.  Spoke with patient and family who prefer to have patient admitted.  They agree and understand plan.         Smitty Knudsen, PA-C 02/07/23 1228     Coral Spikes, DO 02/07/23 1533

## 2023-02-07 NOTE — ED Notes (Signed)
This RN spoke with pt son to update him on pt status and care plan as requested.

## 2023-02-07 NOTE — Progress Notes (Signed)
PHARMACY - ANTICOAGULATION CONSULT NOTE  Pharmacy Consult for heparin Indication: DVT  Allergies  Allergen Reactions   Trimethoprim     Per matrix   Amlodipine Besylate Other (See Comments)    Edema   Lisinopril Cough    Cough    Sulfamethoxazole-Trimethoprim Other (See Comments)    Unknown reaction     Patient Measurements: Height: 5\' 8"  (172.7 cm) Weight: 72.9 kg (160 lb 12.8 oz) IBW/kg (Calculated) : 63.9 Heparin Dosing Weight: 72kg  Vital Signs: Temp: 97.9 F (36.6 C) (01/27 0745) Temp Source: Axillary (01/27 0745) BP: 121/50 (01/27 0800) Pulse Rate: 52 (01/27 0800)  Labs: Recent Labs    02/06/23 2351 02/07/23 0344  HGB 14.2  --   HCT 46.2*  --   PLT 164  --   CREATININE 1.56*  --   TROPONINIHS 26* 26*    Estimated Creatinine Clearance: 22.2 mL/min (A) (by C-G formula based on SCr of 1.56 mg/dL (H)).   Medical History: Past Medical History:  Diagnosis Date   Abscess of right leg 12/24/2020   Childhood asthma    Chronic diastolic heart failure (HCC) 12/25/2012   October 2013 echocardiogram 1. There is moderate concentric left ventricle hypertrophy. 2. Left ventricular ejection fraction estimated by 2D at 60-65 percent. 3. There were no regional wall motion abnormalities. 4. Mild mitral annular calcification. 5. Mild mitral valve regurgitation. 6. There is mild tricuspid regurgitation. 7. Mildly elevated estimated right ventricular systolic pressure. 8. Right ventricular systolic pressure estimated at 35-40 mm Hg. 9. Mild calcification of the aortic valve. 10. Trace aortic valve regurgitation. 11. Analysis of mitral valve inflow, pulmonary vein Doppler and tissue Doppler suggests grade I diastolic dysfunction without elevated left atrial pressure.    Colon polyp    Diverticular stricture (HCC) 2009   Diverticulosis 10/30/2007   DM (diabetes mellitus), type 2 (HCC)    Edema 09/12/2013   Fall 05/18/2017   tripped over wheel of walker; superior and inferior  pubic ramus fractures    Hypercholesterolemia    Hypertension    Hypertension, essential    Hypothyroidism    as a teenager   Indigestion    Insomnia    OA (osteoarthritis) of hip    right hip replacement, OA in bilateral knees   Osteopenia    Actonel stopped in 2012 after 5years therapy   Pneumonia 1939   "hospitalized"   Rapid resting heart rate 12/25/2012   Uncertain cause. Possible ectopic atrial tachycardia. Date entered 12/25/12    Right rotator cuff tear    Seasonal allergies    Unsteadiness on feet      Assessment: 62 yoF presenting with lower extremity swelling found to have left leg DVT. No anticoagulation reported prior to admission. CBC WNL. Pharmacy consulted to manage heparin infusion.     Goal of Therapy:  Heparin level 0.3-0.7 units/ml Monitor platelets by anticoagulation protocol: Yes   Plan:  Give 4000 units bolus x 1 Start heparin infusion at 1200 units/hr Check anti-Xa level in 8 hours and daily while on heparin Continue to monitor H&H and platelets  Ruben Im, PharmD Clinical Pharmacist 02/07/2023 10:16 AM Please check AMION for all [facility] numbers

## 2023-02-07 NOTE — ED Notes (Signed)
ED TO INPATIENT HANDOFF REPORT  ED Nurse Name and Phone #: Rulon Eisenmenger Name/Age/Gender Kelsey Salinas 89 y.o. female Room/Bed: 043C/043C  Code Status   Code Status: Full Code  Home/SNF/Other Skilled nursing facility Patient oriented to: self, place, and time Is this baseline? Yes   Triage Complete: Triage complete  Chief Complaint DVT of proximal leg (deep vein thrombosis) (HCC) [I82.4Y9]  Triage Note Pt BIB EMS from [facility]. EMS reports pt was sent by dr d/t L leg swelling and wants doppler study. Denies any injury/trauma . A&Ox4  Ems VS 130/84, HR 70, 95% 3L [place] (baseline), cbg 198   Allergies Allergies  Allergen Reactions   Trimethoprim     Per matrix   Amlodipine Besylate Other (See Comments)    Edema   Lisinopril Cough    Cough    Sulfamethoxazole-Trimethoprim Other (See Comments)    Unknown reaction     Level of Care/Admitting Diagnosis ED Disposition     ED Disposition  Admit   Condition  --   Comment  Hospital Area: [facility] [[id number]]  Level of Care: Telemetry Medical [[id number]]  May place patient in observation at [facility] or [facility] if equivalent level of care is available:: No  Covid Evaluation: Asymptomatic - no recent exposure (last 10 days) testing not required  Diagnosis: DVT of proximal leg (deep vein thrombosis) [facility]) [[id number]]  Admitting Physician: Clydie Braun [[id number]]  Attending Physician: Clydie Braun [[id number]]          B Medical/Surgery History Past Medical History:  Diagnosis Date   Abscess of right leg 12/24/2020   Childhood asthma    Chronic diastolic heart failure (HCC) 12/25/2012   October 2013 echocardiogram 1. There is moderate concentric left ventricle hypertrophy. 2. Left ventricular ejection fraction estimated by 2D at 60-65 percent. 3. There were no regional wall motion abnormalities. 4. Mild mitral annular calcification. 5. Mild mitral valve regurgitation. 6.  There is mild tricuspid regurgitation. 7. Mildly elevated estimated right ventricular systolic pressure. 8. Right ventricular systolic pressure estimated at 35-40 mm Hg. 9. Mild calcification of the aortic valve. 10. Trace aortic valve regurgitation. 11. Analysis of mitral valve inflow, pulmonary vein Doppler and tissue Doppler suggests grade I diastolic dysfunction without elevated left atrial pressure.    Colon polyp    Diverticular stricture (HCC) 2009   Diverticulosis 10/30/2007   DM (diabetes mellitus), type 2 (HCC)    Edema 09/12/2013   Fall 05/18/2017   tripped over wheel of walker; superior and inferior pubic ramus fractures    Hypercholesterolemia    Hypertension    Hypertension, essential    Hypothyroidism    as a teenager   Indigestion    Insomnia    OA (osteoarthritis) of hip    right hip replacement, OA in bilateral knees   Osteopenia    Actonel stopped in 2012 after 5years therapy   Pneumonia 1939   "hospitalized"   Rapid resting heart rate 12/25/2012   Uncertain cause. Possible ectopic atrial tachycardia. Date entered 12/25/12    Right rotator cuff tear    Seasonal allergies    Unsteadiness on feet    Past Surgical History:  Procedure Laterality Date   CATARACT EXTRACTION W/ INTRAOCULAR LENS  IMPLANT, BILATERAL Bilateral    COLONOSCOPY  2009   Dr. Melchor Amour   DILATION AND CURETTAGE OF UTERUS     FRACTURE SURGERY     JOINT REPLACEMENT     ORIF  WRIST FRACTURE Right    REDUCTION MAMMAPLASTY  1999   TOTAL HIP ARTHROPLASTY Right 03-04-08   Dr. Diona Browner IV Location/Drains/Wounds Patient Lines/Drains/Airways Status     Active Line/Drains/Airways     Name Placement date Placement time Site Days   Peripheral IV 02/07/23 22 G Left;Posterior Hand 02/07/23  1045  Hand  less than 1   Peripheral IV 02/07/23 22 G Anterior;Right Forearm 02/07/23  1046  Forearm  less than 1   Wound / Incision (Open or Dehisced) 12/09/20 Non-pressure wound Knee Anterior;Right 12/09/20   2200  Knee  790   Wound / Incision (Open or Dehisced) 01/20/23 Non-pressure wound Thigh Anterior;Right scab with black head and reddness 01/20/23  1545  Thigh  18            Intake/Output Last 24 hours  Intake/Output Summary (Last 24 hours) at 02/07/2023 1813 Last data filed at 02/07/2023 1813 Gross per 24 hour  Intake --  Output 300 ml  Net -300 ml    Labs/Imaging Results for orders placed or performed during the hospital encounter of 02/06/23 (from the past 48 hours)  CBC     Status: Abnormal   Collection Time: 02/06/23 11:51 PM  Result Value Ref Range   WBC 13.4 (H) 4.0 - 10.5 K/uL   RBC 4.70 3.87 - 5.11 MIL/uL   Hemoglobin 14.2 12.0 - 15.0 g/dL   HCT 86.5 (H) 78.4 - 69.6 %   MCV 98.3 80.0 - 100.0 fL   MCH 30.2 26.0 - 34.0 pg   MCHC 30.7 30.0 - 36.0 g/dL   RDW 29.5 28.4 - 13.2 %   Platelets 164 150 - 400 K/uL   nRBC 0.0 0.0 - 0.2 %    Comment: Performed at [facility], [address] [place], [place] [address]  Basic metabolic panel     Status: Abnormal   Collection Time: 02/06/23 11:51 PM  Result Value Ref Range   Sodium 137 135 - 145 mmol/L   Potassium 4.1 3.5 - 5.1 mmol/L   Chloride 105 98 - 111 mmol/L   CO2 19 (L) 22 - 32 mmol/L   Glucose, Bld 151 (H) 70 - 99 mg/dL    Comment: Glucose reference range applies only to samples taken after fasting for at least 8 hours.   BUN 55 (H) 8 - 23 mg/dL   Creatinine, Ser 2.72 (H) 0.44 - 1.00 mg/dL   Calcium 8.5 (L) 8.9 - 10.3 mg/dL   GFR, Estimated 31 (L) >60 mL/min    Comment: (NOTE) Calculated using the CKD-EPI Creatinine Equation (2021)    Anion gap 13 5 - 15    Comment: Performed at [facility], [address], [place], [place] [address]  Brain natriuretic peptide     Status: Abnormal   Collection Time: 02/06/23 11:51 PM  Result Value Ref Range   B Natriuretic Peptide 125.6 (H) 0.0 - 100.0 pg/mL    Comment: Performed at [facility], [address], [place], [place] [address]  Troponin I  (High Sensitivity)     Status: Abnormal   Collection Time: 02/06/23 11:51 PM  Result Value Ref Range   Troponin I (High Sensitivity) 26 (H) <18 ng/L    Comment: (NOTE) Elevated high sensitivity troponin I (hsTnI) values and significant  changes across serial measurements may suggest ACS but many other  chronic and acute conditions are known to elevate hsTnI results.  Refer to the "Links" section  for chest pain algorithms and additional  guidance. Performed at [facility], [address], [place], [place] [address]   Troponin I (High Sensitivity)     Status: Abnormal   Collection Time: 02/07/23  3:44 AM  Result Value Ref Range   Troponin I (High Sensitivity) 26 (H) <18 ng/L    Comment: (NOTE) Elevated high sensitivity troponin I (hsTnI) values and significant  changes across serial measurements may suggest ACS but many other  chronic and acute conditions are known to elevate hsTnI results.  Refer to the "Links" section for chest pain algorithms and additional  guidance. Performed at [facility], [address] [place], [place] [address]    VAS [place] LOWER EXTREMITY VENOUS (DVT) (ONLY [facility] & [facility]) Result Date: 02/07/2023  Lower Venous DVT Study Patient Name:  Kelsey Salinas  Date of Exam:   02/07/2023 Medical Rec #: [id number]        Accession #:    [id number] Date of Birth: 1926/06/24        Patient Gender: F Patient Age:   89 years Exam Location:  [facility] Procedure:      VAS [place] LOWER EXTREMITY VENOUS (DVT) Referring Phys: [facility] SPONSELLER --------------------------------------------------------------------------------  Indications: Swelling, and Edema.  Risk Factors: Immobility. Limitations: Patient positioning and poor ultrasound/tissue interface. Comparison Study: Novel DVT seen since previous exam 12/10/20. Performing Technologist: Shona Simpson  Examination Guidelines: A complete evaluation includes B-mode imaging, spectral Doppler, color Doppler, and power Doppler  as needed of all accessible portions of each vessel. Bilateral testing is considered an integral part of a complete examination. Limited examinations for reoccurring indications may be performed as noted. The reflux portion of the exam is performed with the patient in reverse Trendelenburg.  +---------+---------------+---------+-----------+----------+-------------------+ RIGHT    CompressibilityPhasicitySpontaneityPropertiesThrombus Aging      +---------+---------------+---------+-----------+----------+-------------------+ CFV      Full           Yes      Yes                                      +---------+---------------+---------+-----------+----------+-------------------+ SFJ      Full                                                             +---------+---------------+---------+-----------+----------+-------------------+ FV Prox  Full                                                             +---------+---------------+---------+-----------+----------+-------------------+ FV Mid   Full                                                             +---------+---------------+---------+-----------+----------+-------------------+ FV DistalFull                    Yes                                      +---------+---------------+---------+-----------+----------+-------------------+  PFV      Full                                                             +---------+---------------+---------+-----------+----------+-------------------+ POP      Full           Yes      Yes                                      +---------+---------------+---------+-----------+----------+-------------------+ PTV      Full                                         Not well visualized +---------+---------------+---------+-----------+----------+-------------------+ PERO                                                  Not Visualized       +---------+---------------+---------+-----------+----------+-------------------+   +---------+---------------+---------+-----------+---------------+--------------+ LEFT     CompressibilityPhasicitySpontaneityProperties     Thrombus Aging +---------+---------------+---------+-----------+---------------+--------------+ CFV      None           No       No         brightly       Acute                                                      echogenic                     +---------+---------------+---------+-----------+---------------+--------------+ SFJ      None           No       No         brightly       Acute                                                      echogenic                     +---------+---------------+---------+-----------+---------------+--------------+ FV Prox  None           No       No         brightly       Acute                                                      echogenic                     +---------+---------------+---------+-----------+---------------+--------------+  FV Mid   None           No       No         dilated        Acute          +---------+---------------+---------+-----------+---------------+--------------+ FV DistalNone           No       No         dilated        Acute          +---------+---------------+---------+-----------+---------------+--------------+ PFV      None           No       No         brightly       Acute                                                      echogenic                     +---------+---------------+---------+-----------+---------------+--------------+ POP      Partial        Yes      Yes        dilated        Acute          +---------+---------------+---------+-----------+---------------+--------------+ PTV      None                    No                        Age                                                                       Indeterminate   +---------+---------------+---------+-----------+---------------+--------------+ PERO                                                       Not Visualized +---------+---------------+---------+-----------+---------------+--------------+ GSV      Partial                 Yes                                      +---------+---------------+---------+-----------+---------------+--------------+     Summary: RIGHT: - There is no evidence of deep vein thrombosis in the lower extremity.  - No cystic structure found in the popliteal fossa.  LEFT: - Findings consistent with acute deep vein thrombosis involving the left common femoral vein, SF junction, left femoral vein, left proximal profunda vein, and left popliteal vein. - Findings consistent with acute superficial vein thrombosis involving the left great saphenous vein.  - Findings consistent with age indeterminate deep vein thrombosis involving the left posterior tibial veins.  - No cystic structure found in  the popliteal fossa.  *See table(s) above for measurements and observations. Electronically signed by Gerarda Fraction on 02/07/2023 at 5:02:09 PM.    Final     Pending Labs Unresulted Labs (From admission, onward)     Start     Ordered   02/08/23 0500  CBC  Daily,   R      02/07/23 1021   02/08/23 0500  Basic metabolic panel  Tomorrow morning,   R        02/07/23 1050   02/07/23 1228  Urinalysis, Routine w reflex microscopic -Urine, Clean Catch  Once,   R       Question:  Specimen Source  Answer:  Urine, Clean Catch   02/07/23 1227            Vitals/Pain Today's Vitals   02/07/23 1156 02/07/23 1200 02/07/23 1330 02/07/23 1644  BP:  107/71 (!) 115/45 107/71  Pulse:  (!) 54 (!) 57 65  Resp:  20 19 14   Temp: 97.7 F (36.5 C)   97.7 F (36.5 C)  TempSrc: Oral   Oral  SpO2:  100% 96% 94%  Weight:      Height:      PainSc:        Isolation Precautions No active isolations  Medications Medications  apixaban (ELIQUIS) tablet  10 mg (10 mg Oral Given 02/07/23 1132)    Followed by  apixaban (ELIQUIS) tablet 5 mg (has no administration in time range)  acetaminophen (TYLENOL) tablet 650 mg (has no administration in time range)    Or  acetaminophen (TYLENOL) suppository 650 mg (has no administration in time range)  ondansetron (ZOFRAN) tablet 4 mg (has no administration in time range)    Or  ondansetron (ZOFRAN) injection 4 mg (has no administration in time range)  albuterol (PROVENTIL) (2.5 MG/3ML) 0.083% nebulizer solution 2.5 mg (has no administration in time range)  atorvastatin (LIPITOR) tablet 40 mg (40 mg Oral Given 02/07/23 1757)  furosemide (LASIX) tablet 40 mg (40 mg Oral Given 02/07/23 1758)  losartan (COZAAR) tablet 100 mg (has no administration in time range)  metoprolol succinate (TOPROL-XL) 24 hr tablet 50 mg (has no administration in time range)  donepezil (ARICEPT) tablet 10 mg (has no administration in time range)  memantine (NAMENDA) tablet 10 mg (has no administration in time range)  empagliflozin ([name]) tablet 10 mg (has no administration in time range)  famotidine (PEPCID) tablet 20 mg (20 mg Oral Given 02/07/23 1758)  polyethylene glycol (MIRALAX / GLYCOLAX) packet 17 g (17 g Oral Not Given 02/07/23 1811)  potassium chloride SA (KLOR-CON M) CR tablet 20 mEq (has no administration in time range)  loratadine (CLARITIN) tablet 10 mg (10 mg Oral Given 02/07/23 1758)  fluticasone furoate-vilanterol (BREO ELLIPTA) 200-25 MCG/ACT 1 puff (1 puff Inhalation Not Given 02/07/23 1720)    And  umeclidinium bromide (INCRUSE ELLIPTA) 62.5 MCG/ACT 1 puff (1 puff Inhalation Not Given 02/07/23 1720)  guaiFENesin (MUCINEX) 12 hr tablet 600 mg (has no administration in time range)  triamcinolone (NASACORT) nasal inhaler 2 spray (2 sprays Nasal Not Given 02/07/23 1811)  diclofenac Sodium (VOLTAREN) 1 % topical gel 2 g (has no administration in time range)  0.9 %  sodium chloride infusion (has no administration in time  range)    Mobility Walks with assistance     Focused Assessments Cardiac/circulation   R Recommendations: See Admitting Provider Note  Report given to:   Additional Notes: takes meds whole, but likes graham crackers with  them, sitter from facility with pt, hard of hearing

## 2023-02-07 NOTE — ED Notes (Signed)
2W given courtesy call.

## 2023-02-07 NOTE — ED Notes (Signed)
Please update the son.

## 2023-02-08 ENCOUNTER — Other Ambulatory Visit (HOSPITAL_COMMUNITY): Payer: Self-pay

## 2023-02-08 ENCOUNTER — Telehealth (HOSPITAL_COMMUNITY): Payer: Self-pay | Admitting: Pharmacy Technician

## 2023-02-08 DIAGNOSIS — I82409 Acute embolism and thrombosis of unspecified deep veins of unspecified lower extremity: Secondary | ICD-10-CM | POA: Diagnosis present

## 2023-02-08 DIAGNOSIS — I82432 Acute embolism and thrombosis of left popliteal vein: Secondary | ICD-10-CM | POA: Diagnosis present

## 2023-02-08 DIAGNOSIS — R531 Weakness: Secondary | ICD-10-CM | POA: Diagnosis not present

## 2023-02-08 DIAGNOSIS — Z87440 Personal history of urinary (tract) infections: Secondary | ICD-10-CM | POA: Diagnosis not present

## 2023-02-08 DIAGNOSIS — Z8249 Family history of ischemic heart disease and other diseases of the circulatory system: Secondary | ICD-10-CM | POA: Diagnosis not present

## 2023-02-08 DIAGNOSIS — F0280 Dementia in other diseases classified elsewhere without behavioral disturbance: Secondary | ICD-10-CM | POA: Diagnosis present

## 2023-02-08 DIAGNOSIS — I471 Supraventricular tachycardia, unspecified: Secondary | ICD-10-CM | POA: Diagnosis present

## 2023-02-08 DIAGNOSIS — I824Y9 Acute embolism and thrombosis of unspecified deep veins of unspecified proximal lower extremity: Secondary | ICD-10-CM | POA: Diagnosis not present

## 2023-02-08 DIAGNOSIS — I82412 Acute embolism and thrombosis of left femoral vein: Secondary | ICD-10-CM | POA: Diagnosis present

## 2023-02-08 DIAGNOSIS — I82442 Acute embolism and thrombosis of left tibial vein: Secondary | ICD-10-CM | POA: Diagnosis present

## 2023-02-08 DIAGNOSIS — J9601 Acute respiratory failure with hypoxia: Secondary | ICD-10-CM | POA: Diagnosis not present

## 2023-02-08 DIAGNOSIS — I130 Hypertensive heart and chronic kidney disease with heart failure and stage 1 through stage 4 chronic kidney disease, or unspecified chronic kidney disease: Secondary | ICD-10-CM | POA: Diagnosis present

## 2023-02-08 DIAGNOSIS — Z79899 Other long term (current) drug therapy: Secondary | ICD-10-CM | POA: Diagnosis not present

## 2023-02-08 DIAGNOSIS — Z96641 Presence of right artificial hip joint: Secondary | ICD-10-CM | POA: Diagnosis present

## 2023-02-08 DIAGNOSIS — I10 Essential (primary) hypertension: Secondary | ICD-10-CM | POA: Diagnosis not present

## 2023-02-08 DIAGNOSIS — N289 Disorder of kidney and ureter, unspecified: Secondary | ICD-10-CM | POA: Diagnosis not present

## 2023-02-08 DIAGNOSIS — E1122 Type 2 diabetes mellitus with diabetic chronic kidney disease: Secondary | ICD-10-CM | POA: Diagnosis present

## 2023-02-08 DIAGNOSIS — J849 Interstitial pulmonary disease, unspecified: Secondary | ICD-10-CM | POA: Diagnosis present

## 2023-02-08 DIAGNOSIS — I5032 Chronic diastolic (congestive) heart failure: Secondary | ICD-10-CM | POA: Diagnosis present

## 2023-02-08 DIAGNOSIS — Z9981 Dependence on supplemental oxygen: Secondary | ICD-10-CM | POA: Diagnosis not present

## 2023-02-08 DIAGNOSIS — G309 Alzheimer's disease, unspecified: Secondary | ICD-10-CM | POA: Diagnosis present

## 2023-02-08 DIAGNOSIS — N179 Acute kidney failure, unspecified: Secondary | ICD-10-CM | POA: Diagnosis present

## 2023-02-08 DIAGNOSIS — I080 Rheumatic disorders of both mitral and aortic valves: Secondary | ICD-10-CM | POA: Diagnosis present

## 2023-02-08 DIAGNOSIS — Z87891 Personal history of nicotine dependence: Secondary | ICD-10-CM | POA: Diagnosis not present

## 2023-02-08 DIAGNOSIS — R413 Other amnesia: Secondary | ICD-10-CM | POA: Diagnosis not present

## 2023-02-08 DIAGNOSIS — R7989 Other specified abnormal findings of blood chemistry: Secondary | ICD-10-CM | POA: Diagnosis not present

## 2023-02-08 DIAGNOSIS — E039 Hypothyroidism, unspecified: Secondary | ICD-10-CM | POA: Diagnosis present

## 2023-02-08 DIAGNOSIS — Z7901 Long term (current) use of anticoagulants: Secondary | ICD-10-CM | POA: Diagnosis not present

## 2023-02-08 DIAGNOSIS — N189 Chronic kidney disease, unspecified: Secondary | ICD-10-CM | POA: Diagnosis present

## 2023-02-08 DIAGNOSIS — N390 Urinary tract infection, site not specified: Secondary | ICD-10-CM | POA: Diagnosis present

## 2023-02-08 DIAGNOSIS — E7800 Pure hypercholesterolemia, unspecified: Secondary | ICD-10-CM | POA: Diagnosis present

## 2023-02-08 DIAGNOSIS — Z7401 Bed confinement status: Secondary | ICD-10-CM | POA: Diagnosis not present

## 2023-02-08 DIAGNOSIS — I824Y2 Acute embolism and thrombosis of unspecified deep veins of left proximal lower extremity: Secondary | ICD-10-CM | POA: Diagnosis not present

## 2023-02-08 DIAGNOSIS — Z7984 Long term (current) use of oral hypoglycemic drugs: Secondary | ICD-10-CM | POA: Diagnosis not present

## 2023-02-08 DIAGNOSIS — J9611 Chronic respiratory failure with hypoxia: Secondary | ICD-10-CM | POA: Diagnosis present

## 2023-02-08 LAB — GLUCOSE, CAPILLARY
Glucose-Capillary: 207 mg/dL — ABNORMAL HIGH (ref 70–99)
Glucose-Capillary: 169 mg/dL — ABNORMAL HIGH (ref 70–99)
Glucose-Capillary: 163 mg/dL — ABNORMAL HIGH (ref 70–99)
Glucose-Capillary: 259 mg/dL — ABNORMAL HIGH (ref 70–99)

## 2023-02-08 LAB — COMPREHENSIVE METABOLIC PANEL
ALT: 24 U/L (ref 0–44)
AST: 17 U/L (ref 15–41)
Albumin: 2.9 g/dL — ABNORMAL LOW (ref 3.5–5.0)
Alkaline Phosphatase: 88 U/L (ref 38–126)
Anion gap: 9 (ref 5–15)
BUN: 51 mg/dL — ABNORMAL HIGH (ref 8–23)
CO2: 23 mmol/L (ref 22–32)
Calcium: 8.6 mg/dL — ABNORMAL LOW (ref 8.9–10.3)
Chloride: 106 mmol/L (ref 98–111)
Creatinine, Ser: 1.48 mg/dL — ABNORMAL HIGH (ref 0.44–1.00)
GFR, Estimated: 33 mL/min — ABNORMAL LOW (ref >60)
Glucose, Bld: 155 mg/dL — ABNORMAL HIGH (ref 70–99)
Potassium: 4 mmol/L (ref 3.5–5.1)
Sodium: 138 mmol/L (ref 135–145)
Total Bilirubin: 0.7 mg/dL (ref 0.0–1.2)
Total Protein: 6.1 g/dL — ABNORMAL LOW (ref 6.5–8.1)

## 2023-02-08 LAB — MAGNESIUM: Magnesium: 2.4 mg/dL (ref 1.7–2.4)

## 2023-02-08 LAB — CBC WITH DIFFERENTIAL/PLATELET
Abs Immature Granulocytes: 0.18 10*3/uL — ABNORMAL HIGH (ref 0.00–0.07)
Basophils Absolute: 0 10*3/uL (ref 0.0–0.1)
Basophils Relative: 0 %
Eosinophils Absolute: 0.3 10*3/uL (ref 0.0–0.5)
Eosinophils Relative: 3 %
HCT: 44.5 % (ref 36.0–46.0)
Hemoglobin: 14.2 g/dL (ref 12.0–15.0)
Immature Granulocytes: 2 %
Lymphocytes Relative: 16 %
Lymphs Abs: 1.7 10*3/uL (ref 0.7–4.0)
MCH: 30.1 pg (ref 26.0–34.0)
MCHC: 31.9 g/dL (ref 30.0–36.0)
MCV: 94.3 fL (ref 80.0–100.0)
Monocytes Absolute: 0.9 10*3/uL (ref 0.1–1.0)
Monocytes Relative: 8 %
Neutro Abs: 7.8 10*3/uL — ABNORMAL HIGH (ref 1.7–7.7)
Neutrophils Relative %: 71 %
Platelets: 158 10*3/uL (ref 150–400)
RBC: 4.72 MIL/uL (ref 3.87–5.11)
RDW: 14.5 % (ref 11.5–15.5)
WBC: 10.9 10*3/uL — ABNORMAL HIGH (ref 4.0–10.5)
nRBC: 0 % (ref 0.0–0.2)

## 2023-02-08 MED ORDER — METOPROLOL SUCCINATE ER 50 MG PO TB24
50.0000 mg | ORAL_TABLET | Freq: Every day | ORAL | Status: DC
  Administered 2023-02-09: 50 mg via ORAL
  Filled 2023-02-08: qty 1

## 2023-02-08 MED ORDER — CEFADROXIL 500 MG PO CAPS
500.0000 mg | ORAL_CAPSULE | Freq: Every day | ORAL | Status: DC
  Administered 2023-02-08 – 2023-02-09 (×2): 500 mg via ORAL
  Filled 2023-02-08 (×2): qty 1

## 2023-02-08 NOTE — Telephone Encounter (Signed)
Patient Product/process development scientist completed.    The patient is insured through [facility]. Patient has Medicare and is not eligible for a copay card, but may be able to apply for patient assistance or Medicare RX Payment Plan (Patient Must reach out to their plan, if eligible for payment plan), if available.    Ran test claim for Eliquis 5 mg and the current 30 day co-pay is $45.00.   This test claim was processed through [facility]- copay amounts may vary at other pharmacies due to pharmacy/plan contracts, or as the patient moves through the different stages of their insurance plan.     Roland Earl, CPHT Pharmacy Technician III Certified Patient Advocate Operating Room Services Pharmacy Patient Advocate Team Direct Number: [phone]  Fax: [phone]

## 2023-02-08 NOTE — Discharge Instructions (Addendum)
Information on my medicine - XARELTO (rivaroxaban)  This medication education was reviewed with me or my healthcare representative as part of my discharge preparation.   WHY WAS XARELTO PRESCRIBED FOR YOU? Xarelto was prescribed to treat blood clots that may have been found in the veins of your legs (deep vein thrombosis) or in your lungs (pulmonary embolism) and to reduce the risk of them occurring again.  What do you need to know about Xarelto? The starting dose is one 15 mg tablet taken TWICE daily with food for the FIRST 21 DAYS then on Feb 14, 2023 the dose is changed to one 20 mg tablet taken ONCE A DAY with your evening meal.  DO NOT stop taking Xarelto without talking to the health care provider who prescribed the medication.  Refill your prescription for 20 mg tablets before you run out.  After discharge, you should have regular check-up appointments with your healthcare provider that is prescribing your Xarelto.  In the future your dose may need to be changed if your kidney function changes by a significant amount.  What do you do if you miss a dose? If you are taking Xarelto TWICE DAILY and you miss a dose, take it as soon as you remember. You may take two 15 mg tablets (total 30 mg) at the same time then resume your regularly scheduled 15 mg twice daily the next day.  If you are taking Xarelto ONCE DAILY and you miss a dose, take it as soon as you remember on the same day then continue your regularly scheduled once daily regimen the next day. Do not take two doses of Xarelto at the same time.   Important Safety Information Xarelto is a blood thinner medicine that can cause bleeding. You should call your healthcare provider right away if you experience any of the following: Bleeding from an injury or your nose that does not stop. Unusual colored urine (red or dark brown) or unusual colored stools (red or black). Unusual bruising for unknown reasons. A serious fall or if you  hit your head (even if there is no bleeding).  Some medicines may interact with Xarelto and might increase your risk of bleeding while on Xarelto. To help avoid this, consult your healthcare provider or pharmacist prior to using any new prescription or non-prescription medications, including herbals, vitamins, non-steroidal anti-inflammatory drugs (NSAIDs) and supplements.  This website has more information on Xarelto: VisitDestination.com.br.

## 2023-02-08 NOTE — Care Management Obs Status (Cosign Needed)
MEDICARE OBSERVATION STATUS NOTIFICATION   Patient Details  Name: Kelsey Salinas MRN: [id number] Date of Birth: [birth year]   Medicare Observation Status Notification Given:  Yes    Janae Bridgeman, RN 02/08/2023, 1:14 PM

## 2023-02-08 NOTE — Progress Notes (Signed)
Transition of Care [facility]) - Inpatient Brief Assessment   Patient Details  Name: Kelsey Salinas MRN: [id number] Date of Birth: Jul 27, 1926  Transition of [facility] CM/SW Contact:    Janae Bridgeman, RN Phone Number: 02/08/2023, 1:19 PM   Clinical Narrative: CM met with the patient at the bedside and patient was provided with [facility] form.  Patient admitted to the hospital from [facility] and nursing assistant from the facility is present at the bedside.  Patient plans to return to the facility for care once medically stable for discharge.  I updated Dr. [name] that patient will need a PT/OT eval before returning to the facility for care.  Kiva [place], MSW is aware and will follow the patient for TOC needs.     Transition of Care Asessment: Insurance and Status: (P) Insurance coverage has been reviewed Patient has primary care physician: (P) Yes Home environment has been reviewed: (P) from [facility] prior to admission to the hospital Prior level of function:: (P) assistance at SNF facility Prior/Current Home Services: (P) Current home services (Active patient at [facility]) Social Drivers of Health Review: (P) SDOH reviewed needs interventions Readmission risk has been reviewed: (P) Yes Transition of care needs: (P) transition of care needs identified, TOC will continue to follow

## 2023-02-08 NOTE — Evaluation (Signed)
Occupational Therapy Evaluation Patient Details Name: Kelsey Salinas MRN: [id number] DOB: [birth year] Today's Date: 02/08/2023   History of Present Illness 89 year old with past medical history significant for Alzheimer dementia, diastolic heart failure, hyperlipidemia, arthritis, history osteoporosis, recent diagnosis of ILD, hypertension who presented from [facility] with progressive left leg swelling.  Doppler ultrasound of the lower extremities significant for acute left DVT along with acute superficial vein thrombosis.  Patient initiated on heparin drip 1/27 at 10am.   Clinical Impression   Patient admitted for the diagnosis above.  PTA she was participating with SNF level rehab after a recent hospitalization for pneumonia.  Prior to this, her aide in the room, stated patient fed herself, participated with upper body ADL, and could stand and pivot with supervision.  Currently patient is too angry to get out of her own way.  She put forth very little effort in to anything but yelling, and required Mod to Max A for any and all attempted movement.  As it stands the patient will benefit from continued inpatient follow up therapy, <3 hours/day as before.  Aide in the room states her 24 hour care providers cannot physically lift her, and that she needs to be able to support her weight.  OT will trial a few sessions, and see if she participates.        If plan is discharge home, recommend the following: Assist for transportation;Assistance with cooking/housework;A lot of help with bathing/dressing/bathroom;Supervision due to cognitive status;Direct supervision/assist for financial management;Direct supervision/assist for medications management;Two people to help with walking and/or transfers    Functional Status Assessment  Patient has had a recent decline in their functional status and demonstrates the ability to make significant improvements in function in a reasonable and predictable amount of  time.  Equipment Recommendations  None recommended by OT    Recommendations for Other Services       Precautions / Restrictions Precautions Precautions: None Precaution Comments: O2 sats Restrictions Weight Bearing Restrictions Per Provider Order: No      Mobility Bed Mobility Overal bed mobility: Needs Assistance Bed Mobility: Supine to Sit     Supine to sit: Max assist, Mod assist     General bed mobility comments: no real initiating Patient Response: Cooperative  Transfers Overall transfer level: Needs assistance Equipment used: 1 person hand held assist Transfers: Sit to/from Stand, Bed to chair/wheelchair/BSC Sit to Stand: Mod assist, Max assist Stand pivot transfers: Max assist         General transfer comment: patient did not assist to stand or try to shift to get to the recliner.      Balance Overall balance assessment: Needs assistance Sitting-balance support: Feet supported Sitting balance-Leahy Scale: Fair   Postural control: Posterior lean Standing balance support: Bilateral upper extremity supported Standing balance-Leahy Scale: Poor                             ADL either performed or assessed with clinical judgement   ADL   Eating/Feeding: Sitting;Minimal assistance   Grooming: Wash/dry hands;Wash/dry face;Set up;Sitting   Upper Body Bathing: Moderate assistance;Sitting       Upper Body Dressing : Moderate assistance;Sitting       Toilet Transfer: Maximal assistance;Squat-pivot;BSC/3in1             General ADL Comments: Patient not initiating any standing or trying to move feet with a transfer.     Vision Patient Visual Report: No change from baseline  Perception Perception: Not tested       Praxis Praxis: Not tested       Pertinent Vitals/Pain Pain Assessment Pain Assessment: Faces Faces Pain Scale: Hurts little more Pain Location: everywhere Pain Descriptors / Indicators: Other (Comment)  (yelling) Pain Intervention(s): Monitored during session     Extremity/Trunk Assessment Upper Extremity Assessment Upper Extremity Assessment: Generalized weakness   Lower Extremity Assessment Lower Extremity Assessment: Defer to PT evaluation   Cervical / Trunk Assessment Cervical / Trunk Assessment: Kyphotic   Communication     Cognition Arousal: Alert Behavior During Therapy: Agitated Overall Cognitive Status: History of cognitive impairments - at baseline                                       General Comments   VSS    Exercises     Shoulder Instructions      Home Living Family/patient expects to be discharged to:: Other (Comment) ([facility] with 24 hour care.)                             Home Equipment: Shower seat - built in;Grab bars - toilet;Grab bars - tub/shower;Rolling Walker (2 wheels);Rollator (4 wheels);Wheelchair - manual          Prior Functioning/Environment               Mobility Comments: Performs sit to stand transfers to wheelchair with assist of stabilizing W/C while she transfers. ADLs Comments: Assist with ADL, iADL and mobility via PCA 24 hour care.        OT Problem List: Decreased activity tolerance;Impaired balance (sitting and/or standing);Pain      OT Treatment/Interventions: Self-care/ADL training;Therapeutic activities;Balance training;Patient/family education    OT Goals(Current goals can be found in the care plan section) Acute Rehab OT Goals OT Goal Formulation: Patient unable to participate in goal setting Time For Goal Achievement: 02/22/23 Potential to Achieve Goals: Fair ADL Goals Pt Will Perform Grooming: with set-up;sitting Pt Will Transfer to Toilet: with min assist;stand pivot transfer;bedside commode  OT Frequency: Min 1X/week    Co-evaluation              AM-PAC OT "6 Clicks" Daily Activity     Outcome Measure Help from another person eating meals?: A Little Help  from another person taking care of personal grooming?: A Little Help from another person toileting, which includes using toliet, bedpan, or urinal?: A Lot Help from another person bathing (including washing, rinsing, drying)?: A Lot Help from another person to put on and taking off regular upper body clothing?: A Lot Help from another person to put on and taking off regular lower body clothing?: A Lot 6 Click Score: 14   End of Session Equipment Utilized During Treatment: Gait belt Nurse Communication: Mobility status  Activity Tolerance: Treatment limited secondary to agitation Patient left: in chair;with call bell/phone within reach;with chair alarm set;with family/visitor present  OT Visit Diagnosis: Muscle weakness (generalized) (M62.81)                Time: 1610-9604 OT Time Calculation (min): 24 min Charges:  OT General Charges $OT Visit: 1 Visit OT Evaluation $OT Eval Moderate Complexity: 1 Mod  02/08/2023  RP, OTR/L  Acute Rehabilitation Services  Office:  [phone]   Suzanna Obey 02/08/2023, 4:39 PM

## 2023-02-08 NOTE — Evaluation (Signed)
Physical Therapy Evaluation Patient Details Name: Kelsey Salinas MRN: [id number] DOB: 1927-01-09 Today's Date: 02/08/2023  History of Present Illness  89 year old with past medical history significant for Alzheimer dementia, diastolic heart failure, hyperlipidemia, arthritis, history osteoporosis, recent diagnosis of ILD, hypertension who presented from [facility] with progressive left leg swelling.  Doppler ultrasound of the lower extremities significant for acute left DVT along with acute superficial vein thrombosis.  Patient initiated on heparin drip 1/27 at 10am.  Clinical Impression  Pt presents to PT with deficits in functional mobility, balance, strength, power, endurance. Pt is agitated during session, often declining PT suggestions for mobility without attempting. Pt requires significant assistance to transfer due to weakness along with posterior lean. Pt remains at a very high risk for falls at this time. Patient will benefit from continued inpatient follow up therapy, <3 hours/day.       If plan is discharge home, recommend the following: Two people to help with walking and/or transfers;A lot of help with bathing/dressing/bathroom;Assistance with cooking/housework;Direct supervision/assist for medications management;Direct supervision/assist for financial management;Assist for transportation;Help with stairs or ramp for entrance;Supervision due to cognitive status   Can travel by private vehicle   No    Equipment Recommendations  (defer to post-acute setting)  Recommendations for Other Services       Functional Status Assessment Patient has had a recent decline in their functional status and demonstrates the ability to make significant improvements in function in a reasonable and predictable amount of time.     Precautions / Restrictions Precautions Precautions: Fall Precaution Comments: LLE DVT Restrictions Weight Bearing Restrictions Per Provider Order: No       Mobility  Bed Mobility Overal bed mobility: Needs Assistance Bed Mobility: Sit to Supine       Sit to supine: Min assist        Transfers Overall transfer level: Needs assistance Equipment used: 1 person hand held assist Transfers: Bed to chair/wheelchair/BSC       Squat pivot transfers: Max assist     General transfer comment: pt does not follow PT cues for hand placement. Pt requires multiple cues from PT to increase trunk flexion to improve transfer efficiency. Pt requires heavy assistance to bring buttocks off chair and to pivot    Ambulation/Gait                  Stairs            Wheelchair Mobility     Tilt Bed    Modified Rankin (Stroke Patients Only)       Balance Overall balance assessment: Needs assistance Sitting-balance support: No upper extremity supported, Feet supported Sitting balance-Leahy Scale: Fair                                       Pertinent Vitals/Pain Pain Assessment Pain Assessment: Faces Faces Pain Scale: Hurts little more Pain Location: generalized Pain Descriptors / Indicators: Discomfort Pain Intervention(s): Monitored during session    Home Living Family/patient expects to be discharged to:: Other (Comment) [facility] with 24/7 care)                 Home Equipment: Shower seat - built in;Grab bars - toilet;Grab bars - tub/shower;Rolling Walker (2 wheels);Rollator (4 wheels);Wheelchair - manual      Prior Function Prior Level of Function : Needs assist  Mobility Comments: Performs sit to stand transfers to wheelchair with assist of stabilizing W/C while she transfers. (baseline prior to recent hospitalization and rehab stay) ADLs Comments: Assist with ADL, iADL and mobility via PCA 24 hour care.     Extremity/Trunk Assessment   Upper Extremity Assessment Upper Extremity Assessment: Generalized weakness    Lower Extremity Assessment Lower Extremity  Assessment: Generalized weakness    Cervical / Trunk Assessment Cervical / Trunk Assessment: Kyphotic  Communication   Communication Communication: No apparent difficulties Cueing Techniques: Verbal cues  Cognition Arousal: Alert Behavior During Therapy: Agitated Overall Cognitive Status: History of cognitive impairments - at baseline                                          General Comments General comments (skin integrity, edema, etc.): VSS on 2L [place]    Exercises     Assessment/Plan    PT Assessment Patient needs continued PT services  PT Problem List Decreased strength;Decreased activity tolerance;Decreased mobility;Decreased safety awareness;Decreased cognition;Decreased balance       PT Treatment Interventions DME instruction;Functional mobility training;Therapeutic activities;Therapeutic exercise;Balance training;Neuromuscular re-education;Cognitive remediation;Patient/family education;Wheelchair mobility training    PT Goals (Current goals can be found in the Care Plan section)  Acute Rehab PT Goals Patient Stated Goal: to reduce assistance needed for transfers PT Goal Formulation: Patient unable to participate in goal setting Time For Goal Achievement: 02/22/23 Potential to Achieve Goals: Fair    Frequency Min 1X/week     Co-evaluation               AM-PAC PT "6 Clicks" Mobility  Outcome Measure Help needed turning from your back to your side while in a flat bed without using bedrails?: A Little Help needed moving from lying on your back to sitting on the side of a flat bed without using bedrails?: A Lot Help needed moving to and from a bed to a chair (including a wheelchair)?: A Lot Help needed standing up from a chair using your arms (e.g., wheelchair or bedside chair)?: A Lot Help needed to walk in hospital room?: Total Help needed climbing 3-5 steps with a railing? : Total 6 Click Score: 11    End of Session Equipment Utilized  During Treatment: Gait belt Activity Tolerance: Treatment limited secondary to agitation Patient left: in bed;with call bell/phone within reach;with bed alarm set;with family/visitor present Nurse Communication: Mobility status PT Visit Diagnosis: Other abnormalities of gait and mobility (R26.89);Muscle weakness (generalized) (M62.81)    Time: 1650-1700 PT Time Calculation (min) (ACUTE ONLY): 10 min   Charges:   PT Evaluation $PT Eval Low Complexity: 1 Low   PT General Charges $$ ACUTE PT VISIT: 1 Visit         Arlyss Gandy, PT, DPT Acute Rehabilitation Office [phone]   Arlyss Gandy 02/08/2023, 5:06 PM

## 2023-02-08 NOTE — Progress Notes (Signed)
[facility] Progress Note Patient: Kelsey Salinas ZOX:[id number] DOB: November 05, 1926 DOA: 02/06/2023  DOS: the patient was seen and examined on 02/08/2023  Brief Hospital Course: PMH of Alzheimer's dementia, chronic diastolic CHF, ILD, HLD, chronic respiratory failure on oxygen, osteoporosis, debility present to the hospital with complaints of swelling of her left leg. Recently hospitalized between 1/9 - 1/13 for hypoxia due to pneumonia and CHF. Since discharge at [facility] she has been active but mostly spending her time in chair.  At baseline she is able to pivot to transfer. Found to have acute DVT involving common femoral as well as saphenofemoral junction.  Initially started on IV heparin.  Now on Eliquis.  No intervention recommended per vascular surgery given her age and lack of significant symptoms.  Assessment and Plan: Acute left leg DVT Presents with swelling of her left leg. Doppler shows evidence of DVT involving left common femoral, SFJ, femoral vein as well as popliteal vein. There is also SVT involving the left great saphenous vein. Was started on IV heparin. Currently on Eliquis. Strict bedrest is not indicated for now since she is on anticoagulation for 24 hours. Would agree with compression if patient tolerates it. Discussed with vascular surgery, recommends anticoagulation only for now. Will place outpatient referral.   Concern for a UTI. History of a UTI. Patient had E. coli UTI during last admission. This admission her UA had some leukocytosis but no nitrates. Patient actually does not offer any urinary symptoms. Was started on IV Rocephin. Will switch to cefadroxil for short course. Admitting provider ordered urine culture.  Will follow-up.   Elevated troponin Patient has chronically elevated troponin. No acute etiology. This is likely secondary to her CKD with poor clearance. Currently does not have any anginal symptoms.  Does not have any chest pain.    Chronic diastolic CHF Other than swelling on her left leg, patient appears to be euvolemic. BNP was +1000 during last admission and currently improved to 125. Echocardiogram shows EF 6065%. Patient was on [name] although due to UTI I would hold and discontinue this medication on discharge. Patient was on losartan although due to mild AKI I will hold this medication for now. Patient is on Lasix Monday Wednesday Friday 40 mg.  Will hold the Wednesday dose for now and reassess renal function. Discussed with son that currently I would avoid IV hydration as she has history of CHF. Since I am holding Lasix I would also hold potassium.   Acute on chronic renal insufficiency Baseline creatinine 1.3. On admission serum creatinine 1.56 with BUN of 55. Received IV hydration. Creatinine improved to 1.48 with BUN of 51. Patient was given Lasix and losartan with hydration. For now we will hold this medication and monitor renal function.   History of pulmonary fibrosis History of pneumonia Chronic respiratory failure with hypoxia Patient with a history of recent right-sided pneumonia completed course of antibiotics. On 3 LPM oxygen since her discharge from the hospital. Has a history of interstitial lung disease progression of disease noted on noncontrasted CT scan of the chest obtained on 1/9. Continue nasal cannula oxygen Continue pharmacy substitution for home inhalers   Essential hypertension Blood pressure normal. As above holding medications.  Sinus bradycardia. Patient is on Toprol-XL 50 mg twice daily. Does have some sinus bradycardia on telemetry.  Although asymptomatic. Will hold the evening dose, switch to daily and monitor.   Hyperlipidemia -Continue atorvastatin   Memory loss, short-term Delirium precautions Continue donepezil and Namenda Patient is angry that she  is in the hospital.  Goals of care conversation. Patient is currently full code.  Subjective: Denies any  acute complaint.  Very angry and frustrated that she is in the hospital.  No nausea no vomiting.  No chest pain.  No leg pain.  No headache no dizziness.  No tingling or numbness in her legs reported.  No abdominal pain.  No bleeding reported prior to admission.  Physical Exam: General: in moderate distress, No Rash Cardiovascular: S1 and S2 Present, No Murmur Respiratory: Good respiratory effort, Bilateral Air entry present. No Crackles, No wheezes Abdomen: Bowel Sound present, No tenderness Extremities: No edema Neuro: Alert and oriented x3, no new focal deficit  Data Reviewed: I have Reviewed nursing notes, Vitals, and Lab results. Since last encounter, pertinent lab results CBC and BMP   . I have ordered test including CBC and BMP  . I have discussed pt's care plan and test results with vascular surgery  .   Disposition: Status is: Observation  Place TED hose Start: 02/07/23 1215 apixaban (ELIQUIS) tablet 10 mg  apixaban (ELIQUIS) tablet 5 mg   Family Communication: Discussed with family on the phone. Level of care: Telemetry Medical   Vitals:   02/08/23 0442 02/08/23 0740 02/08/23 0820 02/08/23 1606  BP: (!) 161/72 (!) 155/64  128/61  Pulse: 64 61  64  Resp: 18 17  16   Temp: 97.8 F (36.6 C)     TempSrc:      SpO2: 99% 98% 98% 96%  Weight:      Height:         Author: Lynden Oxford, MD 02/08/2023 5:23 PM  Please look on [email or web address] to find out who is on call.

## 2023-02-09 ENCOUNTER — Other Ambulatory Visit: Payer: Self-pay | Admitting: *Deleted

## 2023-02-09 ENCOUNTER — Telehealth (HOSPITAL_COMMUNITY): Payer: Self-pay | Admitting: Internal Medicine

## 2023-02-09 DIAGNOSIS — I5032 Chronic diastolic (congestive) heart failure: Secondary | ICD-10-CM | POA: Diagnosis not present

## 2023-02-09 DIAGNOSIS — I824Y2 Acute embolism and thrombosis of unspecified deep veins of left proximal lower extremity: Secondary | ICD-10-CM | POA: Diagnosis not present

## 2023-02-09 DIAGNOSIS — Z87440 Personal history of urinary (tract) infections: Secondary | ICD-10-CM | POA: Diagnosis not present

## 2023-02-09 DIAGNOSIS — N289 Disorder of kidney and ureter, unspecified: Secondary | ICD-10-CM

## 2023-02-09 DIAGNOSIS — E7800 Pure hypercholesterolemia, unspecified: Secondary | ICD-10-CM | POA: Diagnosis not present

## 2023-02-09 LAB — CBC
HCT: 42.4 % (ref 36.0–46.0)
Hemoglobin: 13.5 g/dL (ref 12.0–15.0)
MCH: 30 pg (ref 26.0–34.0)
MCHC: 31.8 g/dL (ref 30.0–36.0)
MCV: 94.2 fL (ref 80.0–100.0)
Platelets: 177 10*3/uL (ref 150–400)
RBC: 4.5 MIL/uL (ref 3.87–5.11)
RDW: 14.7 % (ref 11.5–15.5)
WBC: 10.6 10*3/uL — ABNORMAL HIGH (ref 4.0–10.5)
nRBC: 0 % (ref 0.0–0.2)

## 2023-02-09 LAB — BASIC METABOLIC PANEL
Anion gap: 12 (ref 5–15)
BUN: 49 mg/dL — ABNORMAL HIGH (ref 8–23)
CO2: 19 mmol/L — ABNORMAL LOW (ref 22–32)
Calcium: 8.7 mg/dL — ABNORMAL LOW (ref 8.9–10.3)
Chloride: 105 mmol/L (ref 98–111)
Creatinine, Ser: 1.37 mg/dL — ABNORMAL HIGH (ref 0.44–1.00)
GFR, Estimated: 36 mL/min — ABNORMAL LOW (ref >60)
Glucose, Bld: 191 mg/dL — ABNORMAL HIGH (ref 70–99)
Potassium: 4.1 mmol/L (ref 3.5–5.1)
Sodium: 136 mmol/L (ref 135–145)

## 2023-02-09 LAB — URINE CULTURE: Culture: <10000 — AB

## 2023-02-09 MED ORDER — SODIUM BICARBONATE 650 MG PO TABS
650.0000 mg | ORAL_TABLET | Freq: Two times a day (BID) | ORAL | Status: DC
  Administered 2023-02-09: 650 mg via ORAL
  Filled 2023-02-09: qty 1

## 2023-02-09 MED ORDER — FUROSEMIDE 40 MG PO TABS: 40.0000 mg | ORAL_TABLET | ORAL | Status: DC

## 2023-02-09 MED ORDER — APIXABAN 5 MG PO TABS: ORAL_TABLET | ORAL | 1 refills | Status: DC

## 2023-02-09 MED ORDER — SODIUM BICARBONATE 650 MG PO TABS: 650.0000 mg | ORAL_TABLET | Freq: Two times a day (BID) | ORAL | 0 refills | Status: AC

## 2023-02-09 MED ORDER — CEFADROXIL 500 MG PO CAPS: 500.0000 mg | ORAL_CAPSULE | Freq: Every day | ORAL | 0 refills | Status: AC

## 2023-02-09 MED ORDER — METOPROLOL SUCCINATE ER 50 MG PO TB24: 50.0000 mg | ORAL_TABLET | Freq: Every day | ORAL | 1 refills | Status: AC

## 2023-02-09 MED ORDER — POTASSIUM CHLORIDE CRYS ER 20 MEQ PO TBCR: 20.0000 meq | EXTENDED_RELEASE_TABLET | ORAL | Status: AC

## 2023-02-09 NOTE — [place] FL2 (Signed)
[place] MEDICA[place] FL2 LEVEL OF CARE FORM     IDENTIFICATION  Patient Name: Kelsey Salinas Birthdate: [birth year] Sex: female Admission Date (Current Location): 02/06/2023  [facility] and [place] Number:  Producer, television/film/video and Address:  [facility] [place]. [facility], [address], [place], [place] [address]      Provider Number: [id number]  Attending Physician Name and Address:  Rodolph Bong, MD  Relative Name and Phone Number:  Keyarah, Mcroy (Daughter)  [phone]    Current Level of Care: SNF Recommended Level of Care: Skilled Nursing Facility Prior Approval Number:    Date Approved/Denied:   PASRR Number: [id number]  Discharge Plan: SNF    Current Diagnoses: Patient Active Problem List   Diagnosis Date Noted   Acute DVT (deep venous thrombosis) (HCC) 02/08/2023   DVT of proximal leg (deep vein thrombosis) (HCC) 02/07/2023   Elevated troponin 02/07/2023   Chronic diastolic CHF (congestive heart failure) (HCC) 02/07/2023   Acute on chronic renal insufficiency 02/07/2023   Acute respiratory failure with hypoxia (HCC) 02/07/2023   Dementia without behavioral disturbance (HCC) 02/07/2023   History of pneumonia 02/07/2023   History of UTI 02/07/2023   Urinary tract infection 02/07/2023   Acute cystitis without hematuria 01/21/2023   Hypoxia 01/20/2023   Paroxysmal atrial tachycardia (HCC)    Diastolic dysfunction    Acute cough    Acute heart failure with preserved ejection fraction (HCC) 09/09/2021   Abscess of right leg 12/24/2020   Cellulitis 12/10/2020   Cellulitis of right leg 12/09/2020   Memory loss, short term 12/13/2018   DDD (degenerative disc disease), cervical 12/13/2018   B12 deficiency 12/13/2018   Chronic cough 07/25/2017   Abnormal MMSE 07/25/2017   Type II diabetes mellitus with stage 3 chronic kidney disease (HCC) 05/31/2017   Sinus tachycardia    Multiple fractures of ramus of right pubis with routine healing     Leukocytosis 05/18/2017   Closed pelvic fracture (HCC) 05/18/2017   Bilateral chronic knee pain 01/18/2017   Bilateral primary osteoarthritis of knee 04/07/2016   Hyperglycemia 10/01/2015   Fracture of 5th metatarsal 02/14/2015   CKD (chronic kidney disease) stage 3, GFR 30-59 ml/min (HCC) 12/10/2013   Sebaceous cyst 12/10/2013   Insomnia    Colon polyp    Diverticular stricture (HCC)    Osteopenia    Vitamin D deficiency 10/31/2013   Indigestion    Primary osteoarthritis of hip    Chronic diastolic heart failure (HCC) 12/25/2012   Rapid resting heart rate 12/25/2012   Primary hypertension 12/25/2012   Hypercholesterolemia     Orientation RESPIRATION BLADDER Height & Weight     Self, Situation, Place  O2 Incontinent, External catheter Weight: 166 lb 14.2 oz (75.7 kg) Height:  5\' 8"  (172.7 cm)  BEHAVIORAL SYMPTOMS/MOOD NEUROLOGICAL BOWEL NUTRITION STATUS      Continent Diet (see DC summary)  AMBULATORY STATUS COMMUNICATION OF NEEDS Skin   Extensive Assist Verbally Other (Comment) (Non-pressure wound Thigh Anterior;Right scab with black head and reddness. Wound / Incision (Open or Dehisced) 02/07/23 Buttocks. Wound / Incision (Open or Dehisced) 12/09/20 Non-pressure wound Knee Anterior;Right)                       Personal Care Assistance Level of Assistance  Bathing, Feeding, Dressing Bathing Assistance: Maximum assistance Feeding assistance: Limited assistance Dressing Assistance: Maximum assistance     Functional Limitations Info  Sight, Hearing, Speech Sight Info: Impaired Hearing Info: Impaired Speech Info: Adequate  SPECIAL CARE FACTORS FREQUENCY  PT (By licensed PT), OT (By licensed OT)     PT Frequency: 5x/week OT Frequency: 5x/week            Contractures Contractures Info: Not present    Additional Factors Info  Code Status, Allergies Code Status Info: FULL Allergies Info: Trimethoprim  Amlodipine Besylate  Lisinopril   Sulfamethoxazole-trimethoprim           Current Medications (02/09/2023):  This is the current hospital active medication list Current Facility-Administered Medications  Medication Dose Route Frequency Provider Last Rate Last Admin   acetaminophen (TYLENOL) tablet 650 mg  650 mg Oral Q6H PRN Clydie Braun, MD       Or   acetaminophen (TYLENOL) suppository 650 mg  650 mg Rectal Q6H PRN Madelyn Flavors A, MD       albuterol (PROVENTIL) (2.5 MG/3ML) 0.083% nebulizer solution 2.5 mg  2.5 mg Nebulization Q6H PRN Madelyn Flavors A, MD       apixaban (ELIQUIS) tablet 10 mg  10 mg Oral BID Madelyn Flavors A, MD   10 mg at 02/09/23 1610   Followed by   Melene Muller ON 02/14/2023] apixaban (ELIQUIS) tablet 5 mg  5 mg Oral BID Katrinka Blazing, Rondell A, MD       atorvastatin (LIPITOR) tablet 40 mg  40 mg Oral Daily Smith, Rondell A, MD   40 mg at 02/09/23 0908   cefadroxil (DURICEF) capsule 500 mg  500 mg Oral Daily Rolly Salter, MD   500 mg at 02/09/23 9604   diclofenac Sodium (VOLTAREN) 1 % topical gel 2 g  2 g Topical QID PRN Madelyn Flavors A, MD       donepezil (ARICEPT) tablet 10 mg  10 mg Oral QHS Smith, Rondell A, MD   10 mg at 02/08/23 2204   famotidine (PEPCID) tablet 20 mg  20 mg Oral QODAY Smith, Rondell A, MD   20 mg at 02/09/23 0907   fluticasone furoate-vilanterol (BREO ELLIPTA) 200-25 MCG/ACT 1 puff  1 puff Inhalation Daily Smith, Rondell A, MD   1 puff at 02/09/23 0840   And   umeclidinium bromide (INCRUSE ELLIPTA) 62.5 MCG/ACT 1 puff  1 puff Inhalation Daily Smith, Rondell A, MD   1 puff at 02/09/23 0840   guaiFENesin (MUCINEX) 12 hr tablet 600 mg  600 mg Oral BID Madelyn Flavors A, MD   600 mg at 02/09/23 0908   loratadine (CLARITIN) tablet 10 mg  10 mg Oral Daily Smith, Rondell A, MD   10 mg at 02/09/23 0908   memantine (NAMENDA) tablet 10 mg  10 mg Oral BID Madelyn Flavors A, MD   10 mg at 02/09/23 0907   metoprolol succinate (TOPROL-XL) 24 hr tablet 50 mg  50 mg Oral Daily Rolly Salter, MD    50 mg at 02/09/23 0907   ondansetron (ZOFRAN) tablet 4 mg  4 mg Oral Q6H PRN Madelyn Flavors A, MD       Or   ondansetron (ZOFRAN) injection 4 mg  4 mg Intravenous Q6H PRN Smith, Rondell A, MD       polyethylene glycol (MIRALAX / GLYCOLAX) packet 17 g  17 g Oral Daily Katrinka Blazing, Rondell A, MD   17 g at 02/09/23 5409   sodium bicarbonate tablet 650 mg  650 mg Oral BID Rodolph Bong, MD   650 mg at 02/09/23 8119   triamcinolone (NASACORT) nasal inhaler 2 spray  2 spray Nasal Daily Clydie Braun, MD  2 spray at 02/08/23 0960     Discharge Medications: Please see discharge summary for a list of discharge medications.  Relevant Imaging Results:  Relevant Lab Results:   Additional Information SSN: [id number]  Nason Conradt A [place], [place]

## 2023-02-09 NOTE — Consult Note (Signed)
[facility] Liaison Note  02/09/2023  Jocelyne Reinertsen [birth year] [id number]  Covering Charlesetta Shanks, RN [facility] hospital liaison)  Location: [facility] Liaison met patient at bedside at [facility].  Insurance: Medicare   Kelsey Salinas is a 89 y.o. female who is a [facility] Care [name]  The patient was screened for 30 day readmission hospitalization with noted medium risk score for unplanned readmission risk with 2 IP in 6 months.  The patient was assessed for potential Care Management service needs for post hospital transition for care coordination. Review of patient's electronic medical record reveals patient was admitted for DVT of proximal leg. Pt is a residence at [employer] [place] and has recommendations for SNF level of care for his discharge disposition. Liaison was informed [facility] has a SNF level of care as recommended for this pt via PT eval. Liaison visited pt  at bedside with [facility] available only. Liaison contacted daughter Janilah Hojnacki and reiterated on VBCI services. Receptive if pt returns to [place] part of the facility however if pt is discharged to the SNF level of care with [facility] the facility will continue to address pt's needs.   Plan: [facility] Liaison will continue to follow progress and disposition to asess for post hospital community care coordination/management needs.  Referral request for community care coordination: pending discharge disposition  VBCI Care Management/Population Health does not replace or interfere with any arrangements made by the Inpatient Transition of Care team.   For questions contact:   Elliot Cousin, RN, [facility] Liaison [place]   [facility], Population Health Office Hours MTWF  8:00 am-6:00 pm Direct Dial: [phone] mobile [phone] [Office toll free line] Office Hours are M-F  8:30 - 5 pm [email or web address]

## 2023-02-09 NOTE — Telephone Encounter (Signed)
[facility] Acute Rehab department received a call from pt's husband Kelsey Salinas) requesting to know PT/OT would see his wife so she could be d/c. I explained if the doctor ordered it, we would have her on our list and if she's up for imminent d/c she would be prioritized. Maybe this was something mentioned to him as I did not see our team assigned to this pt yet. AHARRIS

## 2023-02-09 NOTE — TOC Transition Note (Signed)
Transition of Care [facility]) - Discharge Note   Patient Details  Name: Kelsey Salinas MRN: [id number] Date of Birth: 1926/12/03  [facility] CM/SW Contact:  Dani Danis A [place], Theresia Majors Phone Number: 02/09/2023, 1:48 PM   Clinical Narrative:     Patient will DC to: [facility]  Anticipated DC date: 02/09/23  Family notified: Sandford Craze  Transport by: Sharin Mons      Per MD patient ready for DC to [facility]. RN, patient, patient's family, and facility notified of DC. Discharge Summary and FL2 sent to facility. RN to call report prior to discharge [phone], Room 158). DC packet on chart. Ambulance transport requested for patient.     CSW will sign off for now as social work intervention is no longer needed. Please consult [place] again if new needs arise.   Final next level of care: Skilled Nursing Facility Barriers to Discharge: Barriers Resolved   Patient Goals and CMS Choice            Discharge Placement              Patient chooses bed at: [facility] Patient to be transferred to facility by: [facility] Name of family member notified: Zahari Fazzino Patient and family notified of of transfer: 02/09/23  Discharge Plan and Services Additional resources added to the After Visit Summary for                                       Social Drivers of Health (SDOH) Interventions SDOH Screenings   Food Insecurity: No Food Insecurity (02/07/2023)  Housing: Low Risk  (02/07/2023)  Transportation Needs: No Transportation Needs (02/07/2023)  Utilities: Not At Risk (02/07/2023)  Alcohol Screen: Low Risk  (08/03/2017)  Depression (PHQ2-9): Low Risk  (01/25/2023)  Financial Resource Strain: Low Risk  (07/19/2017)  Physical Activity: Insufficiently Active (07/19/2017)  Social Connections: Moderately Integrated (02/07/2023)  Stress: Stress Concern Present (07/19/2017)  Tobacco Use: Medium Risk (02/07/2023)     Readmission Risk Interventions     No data to display

## 2023-02-09 NOTE — Plan of Care (Signed)

## 2023-02-09 NOTE — Discharge Summary (Signed)
Physician Discharge Summary  Kelsey Salinas MVH:[id number] DOB: April 13, 1926 DOA: 02/06/2023  PCP: [name], MD  Admit date: 02/06/2023 Discharge date: 02/09/2023  Time spent: 60 minutes  Recommendations for Outpatient Follow-up:  Follow-up with [name], MD in 1 week.  On follow-up patient will need a basic metabolic profile done to follow-up on electrolytes and renal function.  Patient volume status will need to be reevaluated as well as her blood pressure.  Patient's diuretics held and to be resumed to 03/31/2023.  Referral to outpatient vascular surgery also made.   Discharge Diagnoses:  Principal Problem:   DVT of proximal leg (deep vein thrombosis) (HCC) Active Problems:   History of UTI   Urinary tract infection   Leukocytosis   Elevated troponin   Chronic diastolic CHF (congestive heart failure) (HCC)   Acute on chronic renal insufficiency   Acute respiratory failure with hypoxia (HCC)   History of pneumonia   Primary hypertension   Hypercholesterolemia   Memory loss, short term   Acute DVT (deep venous thrombosis) (HCC)   Discharge Condition: Stable and improved.  Diet recommendation: Heart healthy  Filed Weights   02/06/23 2233 02/07/23 2000  Weight: 72.9 kg 75.7 kg    History of present illness:  HPI per Dr. Blanchard Kelsey Salinas is a 89 y.o. female with medical history significant of Alzheimer's dementia, diastolic CHF, ILD, hyperlipidemia, arthritis, osteoporosis presents with progressive left leg swelling.  History is obtained from the patient as well as caregiver at the facility. She just recently had been hospitalized from 1/9-1/13 with acute hypoxic respiratory failure in the setting of pneumonia and diastolic CHF with also concern for a urinary tract infection.  Patient was treated with antibiotics and Lasix with improvement in symptoms.  After discharge she was sent to [facility] for rehab.  She was noted to have leg swelling and plan getting to  rehab in the left leg, but seem to be getting worse.  The patient denies any associated leg pain.The patient has been on oxygen therapy since her hospital admission.  Denied having any chest discomfort or shortness of breath. The patient's cough, a chronic issue, has persisted. The patient denies any stomach pain, headaches, bleeding, or other symptoms, but reports feeling hungry.   In the emergency department patient was noted to be afebrile, pulse 51-57, and all vital signs relatively maintained.  Labs significant for WBC 13.4, 55, creatinine 1.56, glucose 151 BNP 125.6, high-sensitivity troponins flat at 26.  Doppler ultrasound of the lower extremities significant for acute left DVT along with acute superficial vein thrombosis.  Patient was initially ordered to be started on a heparin drip.  Hospital Course:  Acute left leg DVT Presented with swelling of her left leg. Lower extremity Doppler showed evidence of DVT involving left common femoral, SFJ, femoral vein as well as popliteal vein. There is also SVT involving the left great saphenous vein. Was started on IV heparin. -Patient tolerated IV heparin with no bleeding and subsequently transition to Eliquis. Would agree with compression if patient tolerates it. Dr. [name] discussed with vascular surgery, who recommended anticoagulation only for now. -Outpatient referral for vascular surgery placed. -Outpatient follow-up.   Concern for a UTI. History of a UTI. Patient had E. coli UTI during last admission. This admission her UA had some leukocytosis but no nitrates. Patient actually does not offer any urinary symptoms. Was started on IV Rocephin. IV Rocephin subsequently switched to cefadroxil for short course. -Patient was discharged on 2 more days  of cefadroxil to complete an empiric course of antibiotic treatment.   Elevated troponin Patient has chronically elevated troponin. No acute etiology. This is likely secondary to her CKD with  poor clearance. Patient remained asymptomatic during the hospitalization.  Outpatient follow-up.    Chronic diastolic CHF Other than swelling on her left leg, patient appears to be euvolemic. BNP was +1000 during last admission and improved to 125. Echocardiogram shows EF 6065%. Patient was on [name] although due to UTI [name] WAS HELD AND WILL BE DISCONTINUED ON DISCHARGE.  Patient was on losartan although due to mild AKI, losartan was held and will be discontinued on discharge.  Patient is on Lasix Monday Wednesday Friday 40 mg.  -Patient's ARB was held and patient is to resume Lasix on Monday, 02/14/2023 in the outpatient setting with close outpatient follow-up with PCP.  -Patient's potassium supplementation was also held and will be resumed once her diuretics are resumed on 02/14/2023.   Acute on chronic renal insufficiency Baseline creatinine 1.3. On admission serum creatinine 1.56 with BUN of 55. Patient's diuretics and ARB were held during the hospitalization.  -Patient hydrated gently with IV fluids with improvement with renal function such that by day of discharge creatinine was down to 1.37.   -Patient's ARB will be discontinued on discharge.   -Patient's Lasix will be resumed on home regimen on 02/14/2023.  -Will need close outpatient follow-up with PCP with repeat labs in 1 to 2 weeks.     History of pulmonary fibrosis History of pneumonia Chronic respiratory failure with hypoxia Patient with a history of recent right-sided pneumonia completed course of antibiotics. On 3 LPM oxygen since her discharge from the hospital. Has a history of interstitial lung disease progression of disease noted on noncontrasted CT scan of the chest obtained on 1/9. Patient maintained on home O2 and was at baseline oxygen of 2 to 3 L nasal cannula during the hospitalization.   -Patient continued on home regimen of bronchodilators.   -Outpatient follow-up.     Essential hypertension Blood  pressure normal. -Patient's ARB and diuretics held initially during the hospitalization due to acute on chronic renal insufficiency.   -BP remained stable, BP noted to be soft and as such antihypertensive medications were held on discharge.   -Patient's ARB will be discontinued on discharge.   -Patient to resume home regimen Lasix on Monday, 02/14/2023.    Sinus bradycardia. Patient noted to be on Toprol-XL 50 mg twice daily prior to admission. Does have some sinus bradycardia on telemetry.  Although asymptomatic. Patient's Toprol-XL 50 mg was switched to daily.   -Outpatient follow-up with PCP.     Hyperlipidemia -Patient maintained on home regimen atorvastatin   Memory loss, short-term Patient placed on delirium precautions Patient maintained on home donepezil and Namenda Patient noted to be upset that she was in the hospital and was anxious to be discharged back to [facility]/home.  -Outpatient follow-up with PCP.   Procedures: Lower extremity Dopplers 02/07/2023   Consultations: Curb sided vascular surgery  Discharge Exam: Vitals:   02/09/23 0831 02/09/23 0840  BP: (!) 115/49   Pulse: 72   Resp: 18   Temp: 98.2 F (36.8 C)   SpO2: 97% 97%    General: NAD Cardiovascular: RRR no murmurs rubs or gallops.  No JVD.  1-2+ left lower extremity edema. Respiratory: CTAB anterior lung fields.  Discharge Instructions   Discharge Instructions     Ambulatory referral to Vascular Surgery   Complete by: As directed  Diet - low sodium heart healthy   Complete by: As directed    Increase activity slowly   Complete by: As directed    No wound care   Complete by: As directed       Allergies as of 02/09/2023       Reactions   Trimethoprim    Per matrix   Amlodipine Besylate Other (See Comments)   Edema   Lisinopril Cough   Cough   Sulfamethoxazole-trimethoprim Other (See Comments)   Unknown reaction        Medication List     STOP taking these medications     empagliflozin 10 MG Tabs tablet Commonly known as: [name]   losartan 100 MG tablet Commonly known as: COZAAR       TAKE these medications    acetaminophen 500 MG tablet Commonly known as: TYLENOL Take 500 mg by mouth every morning.   albuterol 108 (90 Base) MCG/ACT inhaler Commonly known as: VENTOLIN HFA Inhale 2 puffs into the lungs every 6 (six) hours as needed for wheezing or shortness of breath.   apixaban 5 MG Tabs tablet Commonly known as: ELIQUIS Take 2 tablets (10 mg total) by mouth 2 (two) times daily for 5 days, THEN 1 tablet (5 mg total) 2 (two) times daily. Start taking on: February 09, 2023   atorvastatin 40 MG tablet Commonly known as: LIPITOR TAKE 1 TABLET ONCE DAILY.   cefadroxil 500 MG capsule Commonly known as: DURICEF Take 1 capsule (500 mg total) by mouth daily for 2 days. Start taking on: February 10, 2023   Cholecalciferol 1.25 MG (50000 UT) capsule Take 50,000 Units by mouth every Friday.   cyanocobalamin 1000 MCG tablet Commonly known as: VITAMIN B12 Take 1 tablet (1,000 mcg total) by mouth daily.   donepezil 10 MG tablet Commonly known as: ARICEPT TAKE ONE TABLET BY MOUTH AT BEDTIME   famotidine 20 MG tablet Commonly known as: PEPCID Take 20 mg by mouth every morning.   fexofenadine 180 MG tablet Commonly known as: ALLEGRA Take 1 tablet (180 mg total) by mouth daily.   furosemide 40 MG tablet Commonly known as: LASIX Take 1 tablet (40 mg total) by mouth every Monday, Wednesday, and Friday. Start taking on: February 14, 2023 What changed: These instructions start on February 14, 2023. If you are unsure what to do until then, ask your doctor or other care provider.   guaiFENesin 600 MG 12 hr tablet Commonly known as: MUCINEX Take 1 tablet (600 mg total) by mouth 2 (two) times daily.   levalbuterol 0.31 MG/3ML nebulizer solution Commonly known as: XOPENEX Take 3 mLs (0.31 mg total) by nebulization 2 (two) times daily for 3  days.   memantine 10 MG tablet Commonly known as: NAMENDA TAKE 1 TABLET BY MOUTH TWICE DAILY.   metoprolol succinate 50 MG 24 hr tablet Commonly known as: TOPROL-XL Take 1 tablet (50 mg total) by mouth daily. Take with or immediately following a meal. Start taking on: February 10, 2023 What changed:  medication strength See the new instructions.   polyethylene glycol 17 g packet Commonly known as: MIRALAX / GLYCOLAX Take 17 g by mouth daily.   potassium chloride SA 20 MEQ tablet Commonly known as: KLOR-CON M Take 1 tablet (20 mEq total) by mouth every Monday, Wednesday, and Friday for 1 dose. Start taking on: February 14, 2023 What changed:  when to take this reasons to take this additional instructions These instructions start on February 14, 2023. If  you are unsure what to do until then, ask your doctor or other care provider.   sodium bicarbonate 650 MG tablet Take 1 tablet (650 mg total) by mouth 2 (two) times daily for 3 days.   sodium chloride 0.65 % Soln nasal spray Commonly known as: OCEAN Place 1 spray into both nostrils as needed for congestion.   Trelegy Ellipta 200-62.5-25 MCG/ACT Aepb Generic drug: Fluticasone-Umeclidin-Vilant Inhale 1 puff into the lungs daily.   triamcinolone 55 MCG/ACT Aero nasal inhaler Commonly known as: NASACORT Place 2 sprays into the nose daily.   Voltaren 1 % Gel Generic drug: diclofenac Sodium Apply 2 g topically 4 (four) times daily as needed (arthritis pain).       Allergies  Allergen Reactions   Trimethoprim     Per matrix   Amlodipine Besylate Other (See Comments)    Edema   Lisinopril Cough    Cough    Sulfamethoxazole-Trimethoprim Other (See Comments)    Unknown reaction     Contact information for follow-up providers     [name], MD. Schedule an appointment as soon as possible for a visit in 1 week(s).   Specialty: Internal Medicine Contact information: [address] [place]  [address] [phone]              Contact information for after-discharge care     Destination     [facility] .   Service: Skilled Nursing Contact information: [address] [place] [address] [phone]                      The results of significant diagnostics from this hospitalization (including imaging, microbiology, ancillary and laboratory) are listed below for reference.    Significant Diagnostic Studies: VAS [place] LOWER EXTREMITY VENOUS (DVT) (ONLY [facility] & [facility]) Result Date: 02/07/2023  Lower Venous DVT Study Patient Name:  AVONNE BERKERY  Date of Exam:   02/07/2023 Medical Rec #: [id number]        Accession #:    [id number] Date of Birth: January 26, 1926        Patient Gender: F Patient Age:   89 years Exam Location:  [facility], [place] Procedure:      VAS [place] LOWER EXTREMITY VENOUS (DVT) Referring Phys: [facility] SPONSELLER --------------------------------------------------------------------------------  Indications: Swelling, and Edema.  Risk Factors: Immobility. Limitations: Patient positioning and poor ultrasound/tissue interface. Comparison Study: Novel DVT seen since previous exam 12/10/20. Performing Technologist: Shona Simpson  Examination Guidelines: A complete evaluation includes B-mode imaging, spectral Doppler, color Doppler, and power Doppler as needed of all accessible portions of each vessel. Bilateral testing is considered an integral part of a complete examination. Limited examinations for reoccurring indications may be performed as noted. The reflux portion of the exam is performed with the patient in reverse Trendelenburg.  +---------+---------------+---------+-----------+----------+-------------------+ RIGHT    CompressibilityPhasicitySpontaneityPropertiesThrombus Aging      +---------+---------------+---------+-----------+----------+-------------------+ CFV      Full           Yes      Yes                                       +---------+---------------+---------+-----------+----------+-------------------+ SFJ      Full                                                             +---------+---------------+---------+-----------+----------+-------------------+  FV Prox  Full                                                             +---------+---------------+---------+-----------+----------+-------------------+ FV Mid   Full                                                             +---------+---------------+---------+-----------+----------+-------------------+ FV DistalFull                    Yes                                      +---------+---------------+---------+-----------+----------+-------------------+ PFV      Full                                                             +---------+---------------+---------+-----------+----------+-------------------+ POP      Full           Yes      Yes                                      +---------+---------------+---------+-----------+----------+-------------------+ PTV      Full                                         Not well visualized +---------+---------------+---------+-----------+----------+-------------------+ PERO                                                  Not Visualized      +---------+---------------+---------+-----------+----------+-------------------+   +---------+---------------+---------+-----------+---------------+--------------+ LEFT     CompressibilityPhasicitySpontaneityProperties     Thrombus Aging +---------+---------------+---------+-----------+---------------+--------------+ CFV      None           No       No         brightly       Acute                                                      echogenic                     +---------+---------------+---------+-----------+---------------+--------------+ SFJ      None           No       No          brightly  Acute                                                      echogenic                     +---------+---------------+---------+-----------+---------------+--------------+ FV Prox  None           No       No         brightly       Acute                                                      echogenic                     +---------+---------------+---------+-----------+---------------+--------------+ FV Mid   None           No       No         dilated        Acute          +---------+---------------+---------+-----------+---------------+--------------+ FV DistalNone           No       No         dilated        Acute          +---------+---------------+---------+-----------+---------------+--------------+ PFV      None           No       No         brightly       Acute                                                      echogenic                     +---------+---------------+---------+-----------+---------------+--------------+ POP      Partial        Yes      Yes        dilated        Acute          +---------+---------------+---------+-----------+---------------+--------------+ PTV      None                    No                        Age                                                                       Indeterminate  +---------+---------------+---------+-----------+---------------+--------------+ PERO  Not Visualized +---------+---------------+---------+-----------+---------------+--------------+ GSV      Partial                 Yes                                      +---------+---------------+---------+-----------+---------------+--------------+     Summary: RIGHT: - There is no evidence of deep vein thrombosis in the lower extremity.  - No cystic structure found in the popliteal fossa.  LEFT: - Findings consistent with acute deep vein thrombosis involving  the left common femoral vein, SF junction, left femoral vein, left proximal profunda vein, and left popliteal vein. - Findings consistent with acute superficial vein thrombosis involving the left great saphenous vein.  - Findings consistent with age indeterminate deep vein thrombosis involving the left posterior tibial veins.  - No cystic structure found in the popliteal fossa.  *See table(s) above for measurements and observations. Electronically signed by Gerarda Fraction on 02/07/2023 at 5:02:09 PM.    Final    DG Chest 2 View Result Date: 01/23/2023 CLINICAL DATA:  Hypoxia. EXAM: CHEST - 2 VIEW COMPARISON:  01/20/2023 FINDINGS: The cardio pericardial silhouette is enlarged. Interstitial markings are diffusely coarsened with chronic features. The lungs are clear without focal pneumonia, edema, pneumothorax or pleural effusion. Bones are diffusely demineralized. Telemetry leads overlie the chest. IMPRESSION: Chronic interstitial coarsening without acute cardiopulmonary findings. The right lower lobe paraspinal consolidative disease seen on previous CT is not readily evident on today's x-ray. Electronically Signed   By: Kennith Center M.D.   On: 01/23/2023 09:09   ECHOCARDIOGRAM COMPLETE Result Date: 01/21/2023    ECHOCARDIOGRAM REPORT   Patient Name:   ZULEY LUTTER Date of Exam: 01/21/2023 Medical Rec #:  [id number]       Height:       68.0 in Accession #:    [id number]      Weight:       159.4 lb Date of Birth:  02-25-1926       BSA:          1.856 m Patient Age:    89 years        BP:           164/89 mmHg Patient Gender: F               HR:           70 bpm. Exam Location:  Inpatient Procedure: 2D Echo, Cardiac Doppler and Color Doppler Indications:    Congestive Heart Failure  History:        Patient has prior history of Echocardiogram examinations, most                 recent 09/10/2021. Arrythmias:PAC and PVC; Risk                 Factors:Hypertension, Dyslipidemia and Former Smoker.  Sonographer:     Karma Ganja Referring Phys: [id number] TIFFANY [place] IMPRESSIONS  1. Left ventricular ejection fraction, by estimation, is 60 to 65%. The left ventricle has normal function. The left ventricle has no regional wall motion abnormalities. There is mild concentric left ventricular hypertrophy. Left ventricular diastolic parameters are consistent with Grade I diastolic dysfunction (impaired relaxation).  2. Peak RV-RA gradient 67 mmHg. Right ventricular systolic function is normal. The right ventricular size is normal.  3. Left atrial size was severely dilated.  4. The mitral valve  is normal in structure. Trivial mitral valve regurgitation. No evidence of mitral stenosis.  5. The aortic valve is tricuspid. Aortic valve regurgitation is trivial. No aortic stenosis is present.  6. The IVC was not well-visualized. FINDINGS  Left Ventricle: Left ventricular ejection fraction, by estimation, is 60 to 65%. The left ventricle has normal function. The left ventricle has no regional wall motion abnormalities. The left ventricular internal cavity size was normal in size. There is  mild concentric left ventricular hypertrophy. Left ventricular diastolic parameters are consistent with Grade I diastolic dysfunction (impaired relaxation). Right Ventricle: Peak RV-RA gradient 67 mmHg. The right ventricular size is normal. No increase in right ventricular wall thickness. Right ventricular systolic function is normal. Left Atrium: Left atrial size was severely dilated. Right Atrium: Right atrial size was normal in size. Pericardium: There is no evidence of pericardial effusion. Mitral Valve: The mitral valve is normal in structure. Trivial mitral valve regurgitation. No evidence of mitral valve stenosis. Tricuspid Valve: The tricuspid valve is normal in structure. Tricuspid valve regurgitation is mild. Aortic Valve: The aortic valve is tricuspid. Aortic valve regurgitation is trivial. Aortic regurgitation PHT measures 449 msec. No aortic  stenosis is present. Aortic valve mean gradient measures 3.0 mmHg. Aortic valve peak gradient measures 6.1 mmHg. Aortic valve area, by VTI measures 2.60 cm. Pulmonic Valve: The pulmonic valve was normal in structure. Pulmonic valve regurgitation is trivial. Aorta: The aortic root is normal in size and structure. Venous: The IVC was not well-visualized. The inferior vena cava was not well visualized. IAS/Shunts: No atrial level shunt detected by color flow Doppler.  LEFT VENTRICLE PLAX 2D LVIDd:         4.40 cm   Diastology LVIDs:         2.60 cm   LV e' medial:    2.94 cm/s LV PW:         1.00 cm   LV E/e' medial:  21.2 LV IVS:        1.30 cm   LV e' lateral:   6.09 cm/s LVOT diam:     2.10 cm   LV E/e' lateral: 10.2 LV SV:         51 LV SV Index:   27 LVOT Area:     3.46 cm  RIGHT VENTRICLE RV Basal diam:  3.70 cm RV S prime:     14.00 cm/s TAPSE (M-mode): 2.3 cm LEFT ATRIUM             Index        RIGHT ATRIUM           Index LA diam:        4.70 cm 2.53 cm/m   RA Area:     16.90 cm LA Vol (A2C):   93.1 ml 50.17 ml/m  RA Volume:   42.80 ml  23.06 ml/m LA Vol (A4C):   97.5 ml 52.54 ml/m LA Biplane Vol: 96.2 ml 51.84 ml/m  AORTIC VALVE AV Area (Vmax):    2.61 cm AV Area (Vmean):   2.75 cm AV Area (VTI):     2.60 cm AV Vmax:           123.00 cm/s AV Vmean:          83.800 cm/s AV VTI:            0.196 m AV Peak Grad:      6.1 mmHg AV Mean Grad:      3.0 mmHg LVOT Vmax:  92.80 cm/s LVOT Vmean:        66.500 cm/s LVOT VTI:          0.147 m LVOT/AV VTI ratio: 0.75 AI PHT:            449 msec  AORTA Ao Root diam: 3.70 cm Ao Asc diam:  3.00 cm MITRAL VALVE               TRICUSPID VALVE MV Area (PHT): 4.19 cm    TR Peak grad:   66.9 mmHg MV Decel Time: 181 msec    TR Vmax:        409.00 cm/s MV E velocity: 62.20 cm/s MV A velocity: 81.20 cm/s  SHUNTS MV E/A ratio:  0.77        Systemic VTI:  0.15 m                            Systemic Diam: 2.10 cm Dalton McleanMD Electronically signed by Wilfred Lacy  Signature Date/Time: 01/21/2023/9:44:13 AM    Final    CT CHEST WO CONTRAST Result Date: 01/20/2023 CLINICAL DATA:  Chronic shortness of breath. EXAM: CT CHEST WITHOUT CONTRAST TECHNIQUE: Multidetector CT imaging of the chest was performed following the standard protocol without IV contrast. RADIATION DOSE REDUCTION: This exam was performed according to the departmental dose-optimization program which includes automated exposure control, adjustment of the mA and/or kV according to patient size and/or use of iterative reconstruction technique. COMPARISON:  Chest x-ray from same day. High-resolution chest CT dated October 08, 2021. FINDINGS: Cardiovascular: Chronic cardiomegaly. Similar trace pericardial fluid. No thoracic aortic aneurysm. Coronary, aortic arch, and branch vessel atherosclerotic vascular disease. Mediastinum/Nodes: No pathologically enlarged mediastinal or axillary lymph nodes. Prominent right paratracheal lymph node is likely reactive. Mildly patulous esophagus. Thyroid gland and trachea demonstrate no significant findings. Lungs/Pleura: Moderate emphysema. Trace right and minimal left pleural effusions. New consolidation in the posterior right upper lobe. Subsegmental atelectasis in the medial right lower lobe. No pneumothorax. Upper Abdomen: No acute abnormality. Musculoskeletal: No acute or significant osseous findings. IMPRESSION: 1. Posterior right upper lobe pneumonia. 2. Trace right and minimal left pleural effusions. 3. Aortic Atherosclerosis (ICD10-I70.0) and Emphysema (ICD10-J43.9). Electronically Signed   By: Obie Dredge M.D.   On: 01/20/2023 09:52   DG Chest 2 View Result Date: 01/20/2023 CLINICAL DATA:  10031.  Coughing, weakness and altered mentation. EXAM: CHEST - 2 VIEW COMPARISON:  HRCT 10/08/2021 FINDINGS: The lungs are emphysematous but clear with slightly elevated right hemidiaphragm. Mild cardiomegaly without overt CHF. Tortuous aorta with patchy calcifications and  stable mediastinum. No pleural effusion is seen. Osteopenia. There is chronic rotator cuff arthropathy with glenohumeral DJD, degenerative change of the spine. IMPRESSION: 1. Emphysema without evidence of acute chest disease. Stable COPD chest. 2. Mild cardiomegaly without overt CHF. 3. Aortic atherosclerosis and uncoiling. Electronically Signed   By: Almira Bar M.D.   On: 01/20/2023 01:35    Microbiology: No results found for this or any previous visit (from the past 240 hours).   Labs: Basic Metabolic Panel: Recent Labs  Lab 02/06/23 2351 02/08/23 0902 02/09/23 0627  NA 137 138 136  K 4.1 4.0 4.1  CL 105 106 105  CO2 19* 23 19*  GLUCOSE 151* 155* 191*  BUN 55* 51* 49*  CREATININE 1.56* 1.48* 1.37*  CALCIUM 8.5* 8.6* 8.7*  MG  --  2.4  --    Liver Function Tests: Recent Labs  Lab  02/08/23 0902  AST 17  ALT 24  ALKPHOS 88  BILITOT 0.7  PROT 6.1*  ALBUMIN 2.9*   No results for input(s): "LIPASE", "AMYLASE" in the last 168 hours. No results for input(s): "AMMONIA" in the last 168 hours. CBC: Recent Labs  Lab 02/06/23 2351 02/08/23 0902 02/09/23 0627  WBC 13.4* 10.9* 10.6*  NEUTROABS  --  7.8*  --   HGB 14.2 14.2 13.5  HCT 46.2* 44.5 42.4  MCV 98.3 94.3 94.2  PLT 164 158 177   Cardiac Enzymes: No results for input(s): "CKTOTAL", "CKMB", "CKMBINDEX", "TROPONINI" in the last 168 hours. BNP: BNP (last 3 results) Recent Labs    01/20/23 0843 01/23/23 1013 02/06/23 2351  BNP 762.9* 1,420.7* 125.6*    ProBNP (last 3 results) No results for input(s): "PROBNP" in the last 8760 hours.  CBG: Recent Labs  Lab 02/08/23 0111 02/08/23 0442 02/08/23 0743 02/08/23 1148  GLUCAP 207* 169* 163* 259*       Signed:  Ramiro Harvest MD.  [facility] 02/09/2023, 12:33 PM

## 2023-02-10 ENCOUNTER — Encounter: Payer: Self-pay | Admitting: Adult Health

## 2023-02-10 ENCOUNTER — Non-Acute Institutional Stay (SKILLED_NURSING_FACILITY): Payer: Medicare Other | Admitting: Adult Health

## 2023-02-10 DIAGNOSIS — E782 Mixed hyperlipidemia: Secondary | ICD-10-CM

## 2023-02-10 DIAGNOSIS — I5031 Acute diastolic (congestive) heart failure: Secondary | ICD-10-CM

## 2023-02-10 DIAGNOSIS — J849 Interstitial pulmonary disease, unspecified: Secondary | ICD-10-CM

## 2023-02-10 DIAGNOSIS — I82412 Acute embolism and thrombosis of left femoral vein: Secondary | ICD-10-CM

## 2023-02-10 DIAGNOSIS — R278 Other lack of coordination: Secondary | ICD-10-CM | POA: Diagnosis not present

## 2023-02-10 DIAGNOSIS — M6259 Muscle wasting and atrophy, not elsewhere classified, multiple sites: Secondary | ICD-10-CM | POA: Diagnosis not present

## 2023-02-10 DIAGNOSIS — I10 Essential (primary) hypertension: Secondary | ICD-10-CM | POA: Diagnosis not present

## 2023-02-10 DIAGNOSIS — R040 Epistaxis: Secondary | ICD-10-CM | POA: Diagnosis not present

## 2023-02-10 DIAGNOSIS — R293 Abnormal posture: Secondary | ICD-10-CM | POA: Diagnosis not present

## 2023-02-10 DIAGNOSIS — R2689 Other abnormalities of gait and mobility: Secondary | ICD-10-CM | POA: Diagnosis not present

## 2023-02-10 DIAGNOSIS — N1832 Chronic kidney disease, stage 3b: Secondary | ICD-10-CM | POA: Diagnosis not present

## 2023-02-10 DIAGNOSIS — M62561 Muscle wasting and atrophy, not elsewhere classified, right lower leg: Secondary | ICD-10-CM | POA: Diagnosis not present

## 2023-02-10 DIAGNOSIS — E1122 Type 2 diabetes mellitus with diabetic chronic kidney disease: Secondary | ICD-10-CM | POA: Diagnosis not present

## 2023-02-10 DIAGNOSIS — R531 Weakness: Secondary | ICD-10-CM | POA: Diagnosis not present

## 2023-02-10 MED ORDER — OXYMETAZOLINE HCL 0.05 % NA SOLN: 2.0000 | Freq: Every day | NASAL | Status: DC | PRN

## 2023-02-10 MED ORDER — AYR SALINE NASAL NA GEL: 1.0000 | Freq: Two times a day (BID) | NASAL | Status: DC

## 2023-02-10 NOTE — Progress Notes (Signed)
Location:  Medical illustrator of Service:  SNF (31) Provider:  Peggye Ley, ANP [facility] [phone]   Code Status: Full Goals of Care:     02/07/2023    5:32 PM  Advanced Directives  Does Patient Have a Medical Advance Directive? No  Would patient like information on creating a medical advance directive? No - Patient declined     Chief Complaint  Patient presents with   Acute Visit    F/u hospitalization     HPI: Patient is a 89 y.o. female seen today for hospital follow-up    The patient is a 89 year old female with interstitial lung disease and congestive heart failure who presents for follow-up after recent hospitalizations.  She was initially admitted to the hospital on January 20, 2023, with acute hypoxic respiratory failure due to pneumonia and acute diastolic heart failure. She received treatment with ceftriaxone and Zithromax and was discharged on Trelegy, Augmentin, Zithromax, and Lasix three times weekly. During this hospitalization, she had an elevated troponin and abnormal EKG findings, prompting a cardiology consultation. An echocardiogram revealed no wall motion abnormalities, a normal ejection fraction, grade one diastolic dysfunction, and a severely dilated left atrium.  She was readmitted from January 26 to February 09, 2023, due to left lower extremity edema and was diagnosed with an acute DVT in the left common femoral vein, left femoral vein, left proximal profunda vein, and left popliteal vein, as well as an acute superficial vein thrombus. She was initially treated with IV heparin and discharged on Eliquis. During her most recent hospitalization, she had leukocytosis in her UA and was treated with IV Rocephin and discharged on two days of cefadroxil. Her BUN was 55 and creatinine was 1.56 on admission, which improved to a BUN of 49 and creatinine of 1.37 on discharge.  She was diagnosed with interstitial lung disease this  year but refused treatment and is dependent on two to three liters of oxygen. For her congestive heart failure, she is on a heart-healthy diet and her weights are being monitored daily. Her BNP was 1420 during her first hospitalization and decreased to 125.6 during her second hospitalization on February 06, 2023.  She has hypertension, which is currently well controlled despite being off Lasix. Her metoprolol was decreased to 50 mg daily due to bradycardia. She also has hyperlipidemia with an LDL of 73 and is on Lipitor. She has a history of type 2 diabetes with an A1c of 6.8 as of January 18, 2023.  She has underlying Alzheimer's dementia and is on Aricept and Namenda. She requires 24-hour caregivers at home and is currently weak, unable to stand and pivot into her wheelchair as she typically does post-hospitalization. She is working with physical and occupational therapy at [facility] to regain her prior level of function with the goal of returning to independent living if possible.  Her caregiver is reporting that she had a nose bleed. She denies any nasal congestion or cold symptoms   Results LABS Troponin: elevated (01/20/2023) UA: leukocytosis (02/06/2023) BUN: 55 (02/06/2023) Creatinine: 1.56 (02/06/2023) BUN: 49 (02/09/2023) Creatinine: 1.37 (02/09/2023) LDL: 73 (01/18/2023) A1c: 6.8 (01/18/2023) BNP: 1420 (01/20/2023) BNP: 25.6 (02/06/2023)  RADIOLOGY Doppler Ultrasound: acute DVT in left common femoral vein, left femoral vein, left proximal profunda vein, left popliteal vein, acute superficial vein thrombus (02/06/2023)  DIAGNOSTIC EKG: abnormal (01/20/2023) Echocardiogram: no wall motion abnormalities, normal ejection fraction, grade 1 diastolic dysfunction, severely dilated left atrium (01/20/2023)  Past Medical History:  Diagnosis Date   Abscess of right leg 12/24/2020   Childhood asthma    Chronic diastolic heart failure (HCC) 12/25/2012   October 2013 echocardiogram 1.  There is moderate concentric left ventricle hypertrophy. 2. Left ventricular ejection fraction estimated by 2D at 60-65 percent. 3. There were no regional wall motion abnormalities. 4. Mild mitral annular calcification. 5. Mild mitral valve regurgitation. 6. There is mild tricuspid regurgitation. 7. Mildly elevated estimated right ventricular systolic pressure. 8. Right ventricular systolic pressure estimated at 35-40 mm Hg. 9. Mild calcification of the aortic valve. 10. Trace aortic valve regurgitation. 11. Analysis of mitral valve inflow, pulmonary vein Doppler and tissue Doppler suggests grade I diastolic dysfunction without elevated left atrial pressure.    Colon polyp    Diverticular stricture (HCC) 2009   Diverticulosis 10/30/2007   DM (diabetes mellitus), type 2 (HCC)    Edema 09/12/2013   Fall 05/18/2017   tripped over wheel of walker; superior and inferior pubic ramus fractures    Hypercholesterolemia    Hypertension    Hypertension, essential    Hypothyroidism    as a teenager   Indigestion    Insomnia    OA (osteoarthritis) of hip    right hip replacement, OA in bilateral knees   Osteopenia    Actonel stopped in 2012 after 5years therapy   Pneumonia 1939   "hospitalized"   Rapid resting heart rate 12/25/2012   Uncertain cause. Possible ectopic atrial tachycardia. Date entered 12/25/12    Right rotator cuff tear    Seasonal allergies    Unsteadiness on feet     Past Surgical History:  Procedure Laterality Date   CATARACT EXTRACTION W/ INTRAOCULAR LENS  IMPLANT, BILATERAL Bilateral    COLONOSCOPY  2009   Dr. Melchor Amour   DILATION AND CURETTAGE OF UTERUS     FRACTURE SURGERY     JOINT REPLACEMENT     ORIF WRIST FRACTURE Right    REDUCTION MAMMAPLASTY  1999   TOTAL HIP ARTHROPLASTY Right 03-04-08   Dr. Lequita Halt    Allergies  Allergen Reactions   Trimethoprim     Per matrix   Amlodipine Besylate Other (See Comments)    Edema   Lisinopril Cough    Cough     Sulfamethoxazole-Trimethoprim Other (See Comments)    Unknown reaction     Outpatient Encounter Medications as of 02/10/2023  Medication Sig   acetaminophen (TYLENOL) 500 MG tablet Take 500 mg by mouth every morning.   albuterol (VENTOLIN HFA) 108 (90 Base) MCG/ACT inhaler Inhale 2 puffs into the lungs every 6 (six) hours as needed for wheezing or shortness of breath.   apixaban (ELIQUIS) 5 MG TABS tablet Take 2 tablets (10 mg total) by mouth 2 (two) times daily for 5 days, THEN 1 tablet (5 mg total) 2 (two) times daily.   atorvastatin (LIPITOR) 40 MG tablet TAKE 1 TABLET ONCE DAILY.   cefadroxil (DURICEF) 500 MG capsule Take 1 capsule (500 mg total) by mouth daily for 2 days.   Cholecalciferol 1.25 MG (50000 UT) capsule Take 50,000 Units by mouth every Friday.   diclofenac Sodium (VOLTAREN) 1 % GEL Apply 2 g topically 4 (four) times daily as needed (arthritis pain).   donepezil (ARICEPT) 10 MG tablet TAKE ONE TABLET BY MOUTH AT BEDTIME   famotidine (PEPCID) 20 MG tablet Take 20 mg by mouth every morning.   fexofenadine (ALLEGRA) 180 MG tablet Take 1 tablet (180 mg total) by mouth daily.  Fluticasone-Umeclidin-Vilant (TRELEGY ELLIPTA) 200-62.5-25 MCG/ACT AEPB Inhale 1 puff into the lungs daily.   [START ON 02/14/2023] furosemide (LASIX) 40 MG tablet Take 1 tablet (40 mg total) by mouth every Monday, Wednesday, and Friday.   guaiFENesin (MUCINEX) 600 MG 12 hr tablet Take 1 tablet (600 mg total) by mouth 2 (two) times daily.   levalbuterol (XOPENEX) 0.31 MG/3ML nebulizer solution Take 3 mLs (0.31 mg total) by nebulization 2 (two) times daily for 3 days. (Patient not taking: Reported on 02/07/2023)   memantine (NAMENDA) 10 MG tablet TAKE 1 TABLET BY MOUTH TWICE DAILY.   metoprolol succinate (TOPROL-XL) 50 MG 24 hr tablet Take 1 tablet (50 mg total) by mouth daily. Take with or immediately following a meal.   polyethylene glycol (MIRALAX / GLYCOLAX) 17 g packet Take 17 g by mouth daily.   [START ON  02/14/2023] potassium chloride SA (KLOR-CON M) 20 MEQ tablet Take 1 tablet (20 mEq total) by mouth every Monday, Wednesday, and Friday for 1 dose.   sodium bicarbonate 650 MG tablet Take 1 tablet (650 mg total) by mouth 2 (two) times daily for 3 days.   sodium chloride (OCEAN) 0.65 % SOLN nasal spray Place 1 spray into both nostrils as needed for congestion.   triamcinolone (NASACORT) 55 MCG/ACT AERO nasal inhaler Place 2 sprays into the nose daily.   vitamin B-12 (CYANOCOBALAMIN) 1000 MCG tablet Take 1 tablet (1,000 mcg total) by mouth daily.   No facility-administered encounter medications on file as of 02/10/2023.    Review of Systems:  Review of Systems  Health Maintenance  Topic Date Due   OPHTHALMOLOGY EXAM  07/11/2020   FOOT EXAM  05/04/2023   Medicare Annual Wellness (AWV)  05/14/2023   HEMOGLOBIN A1C  07/18/2023   DTaP/Tdap/Td (2 - Td or Tdap) 07/20/2027   Pneumonia Vaccine 22+ Years old  Completed   INFLUENZA VACCINE  Completed   DEXA SCAN  Completed   COVID-19 Vaccine  Completed   Zoster Vaccines- Shingrix  Completed   HPV VACCINES  Aged Out    Physical Exam: Vitals:   02/10/23 1513  BP: 136/78  Pulse: 78  Temp: 97.8 F (36.6 C)  SpO2: 90%  Weight: 166 lb (75.3 kg)   Body mass index is 25.24 kg/m. Physical Exam  Labs reviewed: Basic Metabolic Panel: Recent Labs    04/29/22 0000 08/24/22 0000 01/18/23 0000 01/20/23 0154 01/20/23 4098 01/21/23 0259 02/06/23 2351 02/08/23 0902 02/09/23 0627  NA 142   < > 141   < >  --    < > 137 138 136  K 4.3   < > 4.1   < >  --    < > 4.1 4.0 4.1  CL  --    < > 106   < >  --    < > 105 106 105  CO2 23*  --  23*   < >  --    < > 19* 23 19*  GLUCOSE  --   --   --    < >  --    < > 151* 155* 191*  BUN 23*   < > 26*   < >  --    < > 55* 51* 49*  CREATININE 1.1   < > 1.3*   < >  --    < > 1.56* 1.48* 1.37*  CALCIUM 9.2   < > 9.2   < >  --    < > 8.5* 8.6*  8.7*  MG  --   --   --   --  2.1  --   --  2.4  --   TSH 1.07   --  1.29  --   --   --   --   --   --    < > = values in this interval not displayed.   Liver Function Tests: Recent Labs    01/18/23 0000 01/20/23 0154 02/08/23 0902  AST 22 19 17   ALT 16 15 24   ALKPHOS 108 82 88  BILITOT  --  1.5* 0.7  PROT  --  6.8 6.1*  ALBUMIN 3.8 3.4* 2.9*   No results for input(s): "LIPASE", "AMYLASE" in the last 8760 hours. No results for input(s): "AMMONIA" in the last 8760 hours. CBC: Recent Labs    01/20/23 0154 01/20/23 0526 02/06/23 2351 02/08/23 0902 02/09/23 0627  WBC 13.6*   < > 13.4* 10.9* 10.6*  NEUTROABS 11.2*  --   --  7.8*  --   HGB 15.6*   < > 14.2 14.2 13.5  HCT 48.7*   < > 46.2* 44.5 42.4  MCV 95.9   < > 98.3 94.3 94.2  PLT 128*   < > 164 158 177   < > = values in this interval not displayed.   Lipid Panel: Recent Labs    04/29/22 0000 01/18/23 0000  CHOL 159 146  HDL 62 56  LDLCALC 73 65  TRIG 120 126   Lab Results  Component Value Date   HGBA1C 6.8 01/18/2023    Procedures since last visit: VAS [place] LOWER EXTREMITY VENOUS (DVT) (ONLY [facility] & [facility]) Result Date: 02/07/2023  Lower Venous DVT Study Patient Name:  Kelsey Salinas  Date of Exam:   02/07/2023 Medical Rec #: [id number]        Accession #:    [id number] Date of Birth: [birth year]        Patient Gender: F Patient Age:   84 years Exam Location:  [facility] Procedure:      VAS [place] LOWER EXTREMITY VENOUS (DVT) Referring Phys: [facility] SPONSELLER --------------------------------------------------------------------------------  Indications: Swelling, and Edema.  Risk Factors: Immobility. Limitations: Patient positioning and poor ultrasound/tissue interface. Comparison Study: Novel DVT seen since previous exam 12/10/20. Performing Technologist: Shona Simpson  Examination Guidelines: A complete evaluation includes B-mode imaging, spectral Doppler, color Doppler, and power Doppler as needed of all accessible portions of each vessel. Bilateral testing is considered an integral  part of a complete examination. Limited examinations for reoccurring indications may be performed as noted. The reflux portion of the exam is performed with the patient in reverse Trendelenburg.  +---------+---------------+---------+-----------+----------+-------------------+ RIGHT    CompressibilityPhasicitySpontaneityPropertiesThrombus Aging      +---------+---------------+---------+-----------+----------+-------------------+ CFV      Full           Yes      Yes                                      +---------+---------------+---------+-----------+----------+-------------------+ SFJ      Full                                                             +---------+---------------+---------+-----------+----------+-------------------+ FV  Prox  Full                                                             +---------+---------------+---------+-----------+----------+-------------------+ FV Mid   Full                                                             +---------+---------------+---------+-----------+----------+-------------------+ FV DistalFull                    Yes                                      +---------+---------------+---------+-----------+----------+-------------------+ PFV      Full                                                             +---------+---------------+---------+-----------+----------+-------------------+ POP      Full           Yes      Yes                                      +---------+---------------+---------+-----------+----------+-------------------+ PTV      Full                                         Not well visualized +---------+---------------+---------+-----------+----------+-------------------+ PERO                                                  Not Visualized      +---------+---------------+---------+-----------+----------+-------------------+    +---------+---------------+---------+-----------+---------------+--------------+ LEFT     CompressibilityPhasicitySpontaneityProperties     Thrombus Aging +---------+---------------+---------+-----------+---------------+--------------+ CFV      None           No       No         brightly       Acute                                                      echogenic                     +---------+---------------+---------+-----------+---------------+--------------+ SFJ      None           No       No         brightly  Acute                                                      echogenic                     +---------+---------------+---------+-----------+---------------+--------------+ FV Prox  None           No       No         brightly       Acute                                                      echogenic                     +---------+---------------+---------+-----------+---------------+--------------+ FV Mid   None           No       No         dilated        Acute          +---------+---------------+---------+-----------+---------------+--------------+ FV DistalNone           No       No         dilated        Acute          +---------+---------------+---------+-----------+---------------+--------------+ PFV      None           No       No         brightly       Acute                                                      echogenic                     +---------+---------------+---------+-----------+---------------+--------------+ POP      Partial        Yes      Yes        dilated        Acute          +---------+---------------+---------+-----------+---------------+--------------+ PTV      None                    No                        Age                                                                       Indeterminate  +---------+---------------+---------+-----------+---------------+--------------+ PERO  Not Visualized +---------+---------------+---------+-----------+---------------+--------------+ GSV      Partial                 Yes                                      +---------+---------------+---------+-----------+---------------+--------------+     Summary: RIGHT: - There is no evidence of deep vein thrombosis in the lower extremity.  - No cystic structure found in the popliteal fossa.  LEFT: - Findings consistent with acute deep vein thrombosis involving the left common femoral vein, SF junction, left femoral vein, left proximal profunda vein, and left popliteal vein. - Findings consistent with acute superficial vein thrombosis involving the left great saphenous vein.  - Findings consistent with age indeterminate deep vein thrombosis involving the left posterior tibial veins.  - No cystic structure found in the popliteal fossa.  *See table(s) above for measurements and observations. Electronically signed by Gerarda Fraction on 02/07/2023 at 5:02:09 PM.    Final     Assessment and Plan  Deep Vein Thrombosis Acute DVT in left common femoral, femoral, proximal profunda, and popliteal veins. Treated with IV heparin and discharged on Eliquis. -Continue Eliquis. -Follow up with vascular surgery. I recommended compression hose but she declined.   Interstitial Lung Disease Diagnosed this year, dependent on 2-3 liters of oxygen, and refused treatment. -Continue oxygen therapy.  Acute Diastolic Heart Failure Recent hospitalization for acute hypoxic respiratory failure in the setting of pneumonia and acute diastolic heart failure. BNP elevated to 1420 during first hospitalization and 125.6 during second hospitalization. Echo showed grade one diastolic dysfunction and a severely dilated left atrium. -Resume Lasix on 02/14/2023. -Repeat BMP in one month. -Monitor weights daily.  Hypertension Well controlled despite being off Lasix.  Metoprolol decreased to 50mg  daily due to bradycardia. -Continue Metoprolol 50mg  daily.  Hyperlipidemia LDL 73, on Lipitor. -Continue Lipitor.  Type 2 Diabetes A1c 6.8 on 01/18/2023. -Continue current management.  Alzheimer's Dementia On Aricept and Namenda. -Continue Aricept and Namenda.  General Health Maintenance / Followup Plans -Work with PT and OT to regain prior level of function with the goal of returning to independent living. -Follow up with vascular surgery regarding DVT.  Epistaxis Saline ayr BID for dryness Stop steroid nasal spray Afrin every day prn for epistaxis Educated caregiver regarding how to apply pressure for a nasal bleed  CKD BUN and Cr improved off lasix, ARB, with IVF during hospitalization REpeat BMP 1 week   Labs/tests ordered:  * No order type specified * BMP 1 week

## 2023-02-11 DIAGNOSIS — M6259 Muscle wasting and atrophy, not elsewhere classified, multiple sites: Secondary | ICD-10-CM | POA: Diagnosis not present

## 2023-02-11 DIAGNOSIS — M62561 Muscle wasting and atrophy, not elsewhere classified, right lower leg: Secondary | ICD-10-CM | POA: Diagnosis not present

## 2023-02-11 DIAGNOSIS — R278 Other lack of coordination: Secondary | ICD-10-CM | POA: Diagnosis not present

## 2023-02-11 DIAGNOSIS — R293 Abnormal posture: Secondary | ICD-10-CM | POA: Diagnosis not present

## 2023-02-11 DIAGNOSIS — R531 Weakness: Secondary | ICD-10-CM | POA: Diagnosis not present

## 2023-02-11 DIAGNOSIS — R2689 Other abnormalities of gait and mobility: Secondary | ICD-10-CM | POA: Diagnosis not present

## 2023-02-12 DIAGNOSIS — M62562 Muscle wasting and atrophy, not elsewhere classified, left lower leg: Secondary | ICD-10-CM | POA: Diagnosis not present

## 2023-02-12 DIAGNOSIS — R278 Other lack of coordination: Secondary | ICD-10-CM | POA: Diagnosis not present

## 2023-02-12 DIAGNOSIS — I82402 Acute embolism and thrombosis of unspecified deep veins of left lower extremity: Secondary | ICD-10-CM | POA: Diagnosis not present

## 2023-02-12 DIAGNOSIS — J9601 Acute respiratory failure with hypoxia: Secondary | ICD-10-CM | POA: Diagnosis not present

## 2023-02-12 DIAGNOSIS — R2689 Other abnormalities of gait and mobility: Secondary | ICD-10-CM | POA: Diagnosis not present

## 2023-02-12 DIAGNOSIS — R531 Weakness: Secondary | ICD-10-CM | POA: Diagnosis not present

## 2023-02-12 DIAGNOSIS — M62561 Muscle wasting and atrophy, not elsewhere classified, right lower leg: Secondary | ICD-10-CM | POA: Diagnosis not present

## 2023-02-14 DIAGNOSIS — R278 Other lack of coordination: Secondary | ICD-10-CM | POA: Diagnosis not present

## 2023-02-14 DIAGNOSIS — R2689 Other abnormalities of gait and mobility: Secondary | ICD-10-CM | POA: Diagnosis not present

## 2023-02-14 DIAGNOSIS — R531 Weakness: Secondary | ICD-10-CM | POA: Diagnosis not present

## 2023-02-14 DIAGNOSIS — J9601 Acute respiratory failure with hypoxia: Secondary | ICD-10-CM | POA: Diagnosis not present

## 2023-02-14 DIAGNOSIS — M62561 Muscle wasting and atrophy, not elsewhere classified, right lower leg: Secondary | ICD-10-CM | POA: Diagnosis not present

## 2023-02-14 DIAGNOSIS — M62562 Muscle wasting and atrophy, not elsewhere classified, left lower leg: Secondary | ICD-10-CM | POA: Diagnosis not present

## 2023-02-15 DIAGNOSIS — R2689 Other abnormalities of gait and mobility: Secondary | ICD-10-CM | POA: Diagnosis not present

## 2023-02-15 DIAGNOSIS — M62561 Muscle wasting and atrophy, not elsewhere classified, right lower leg: Secondary | ICD-10-CM | POA: Diagnosis not present

## 2023-02-15 DIAGNOSIS — R278 Other lack of coordination: Secondary | ICD-10-CM | POA: Diagnosis not present

## 2023-02-15 DIAGNOSIS — R531 Weakness: Secondary | ICD-10-CM | POA: Diagnosis not present

## 2023-02-15 DIAGNOSIS — M62562 Muscle wasting and atrophy, not elsewhere classified, left lower leg: Secondary | ICD-10-CM | POA: Diagnosis not present

## 2023-02-15 DIAGNOSIS — J9601 Acute respiratory failure with hypoxia: Secondary | ICD-10-CM | POA: Diagnosis not present

## 2023-02-15 DIAGNOSIS — I10 Essential (primary) hypertension: Secondary | ICD-10-CM | POA: Diagnosis not present

## 2023-02-15 LAB — COMPREHENSIVE METABOLIC PANEL
Calcium: 8.5 — AB (ref 8.7–10.7)
eGFR: 48

## 2023-02-15 LAB — BASIC METABOLIC PANEL
BUN: 27 — AB (ref 4–21)
CO2: 21 (ref 13–22)
Chloride: 107 (ref 99–108)
Creatinine: 1.1 (ref 0.5–1.1)
Glucose: 133
Potassium: 4 meq/L (ref 3.5–5.1)
Sodium: 139 (ref 137–147)

## 2023-02-16 DIAGNOSIS — J9601 Acute respiratory failure with hypoxia: Secondary | ICD-10-CM | POA: Diagnosis not present

## 2023-02-16 DIAGNOSIS — M62562 Muscle wasting and atrophy, not elsewhere classified, left lower leg: Secondary | ICD-10-CM | POA: Diagnosis not present

## 2023-02-16 DIAGNOSIS — M62561 Muscle wasting and atrophy, not elsewhere classified, right lower leg: Secondary | ICD-10-CM | POA: Diagnosis not present

## 2023-02-16 DIAGNOSIS — R278 Other lack of coordination: Secondary | ICD-10-CM | POA: Diagnosis not present

## 2023-02-16 DIAGNOSIS — R2689 Other abnormalities of gait and mobility: Secondary | ICD-10-CM | POA: Diagnosis not present

## 2023-02-16 DIAGNOSIS — R531 Weakness: Secondary | ICD-10-CM | POA: Diagnosis not present

## 2023-02-16 NOTE — Progress Notes (Signed)
 Office Note     CC:  DVT Requesting Provider:  Charlanne Fredia CROME, MD  HPI: Kelsey Salinas is a 89 y.o. (June 30, 1926) female presenting at the request of .Charlanne Fredia CROME, MD for evaluation of recent left lower extremity DVT diagnosis.  She was admitted to the hospital on January 9 with acute hypoxic respiratory failure due to pneumonia and acute diastolic heart failure.  She was treated and discharged.  She was readmitted later in the month with severe lower extremity edema, and diagnosed with a DVT.  She was started on Eliquis , and was discharged.  Kelsey Salinas presents today in a wheelchair with a Hoyer mat below her, accompanied by her aide.  Her son, cardiologist at [facility], was present via telephone.  On exam, Kelsey Salinas was spunky, she denied lower extremity pain, appreciated continued swelling, but was relatively asymptomatic. A native of a native of [place], she moved to [place]  years ago with her family.  The pt is  on a statin for cholesterol management.  The pt is - on a daily aspirin .   Other AC:  eliquis  The pt is  on medication for hypertension.   The pt is is not diabetic.  Tobacco hx:  -  Past Medical History:  Diagnosis Date   Abscess of right leg 12/24/2020   Childhood asthma    Chronic diastolic heart failure (HCC) 12/25/2012   October 2013 echocardiogram 1. There is moderate concentric left ventricle hypertrophy. 2. Left ventricular ejection fraction estimated by 2D at 60-65 percent. 3. There were no regional wall motion abnormalities. 4. Mild mitral annular calcification. 5. Mild mitral valve regurgitation. 6. There is mild tricuspid regurgitation. 7. Mildly elevated estimated right ventricular systolic pressure. 8. Right ventricular systolic pressure estimated at 35-40 mm Hg. 9. Mild calcification of the aortic valve. 10. Trace aortic valve regurgitation. 11. Analysis of mitral valve inflow, pulmonary vein Doppler and tissue Doppler suggests grade I diastolic  dysfunction without elevated left atrial pressure.    Colon polyp    Diverticular stricture (HCC) 2009   Diverticulosis 10/30/2007   DM (diabetes mellitus), type 2 (HCC)    Edema 09/12/2013   Fall 05/18/2017   tripped over wheel of walker; superior and inferior pubic ramus fractures    Hypercholesterolemia    Hypertension    Hypertension, essential    Hypothyroidism    as a teenager   Indigestion    Insomnia    OA (osteoarthritis) of hip    right hip replacement, OA in bilateral knees   Osteopenia    Actonel stopped in 2012 after 5years therapy   Pneumonia 1939   hospitalized   Rapid resting heart rate 12/25/2012   Uncertain cause. Possible ectopic atrial tachycardia. Date entered 12/25/12    Right rotator cuff tear    Seasonal allergies    Unsteadiness on feet     Past Surgical History:  Procedure Laterality Date   CATARACT EXTRACTION W/ INTRAOCULAR LENS  IMPLANT, BILATERAL Bilateral    COLONOSCOPY  2009   Dr. Newell   DILATION AND CURETTAGE OF UTERUS     FRACTURE SURGERY     JOINT REPLACEMENT     ORIF WRIST FRACTURE Right    REDUCTION MAMMAPLASTY  1999   TOTAL HIP ARTHROPLASTY Right 03-04-08   Dr. Melodi    Social History   Socioeconomic History   Marital status: Married    Spouse name: Not on file   Number of children: Not on file   Years of  education: Not on file   Highest education level: Not on file  Occupational History   Occupation: retired travel agent  Tobacco Use   Smoking status: Former    Current packs/day: 0.12    Average packs/day: 0.1 packs/day for 50.0 years (6.0 ttl pk-yrs)    Types: Cigarettes   Smokeless tobacco: Never   Tobacco comments:    stopped in the ate 1990s  Vaping Use   Vaping status: Never Used  Substance and Sexual Activity   Alcohol use: Yes    Comment: rarely   Drug use: Never   Sexual activity: Not Currently  Other Topics Concern   Not on file  Social History Narrative   Lives at [place] since 2010    Husband Kelsey Salinas married 1952 (husband in memory care unit at [facility])   Former smoker 1/2 PPD, for 30 years, stopped 01/10/1989   Alcohol 2 glasses of wine at night   Exercise: machines 3 times a week and daily walk   No POA/LW         Social Drivers of Corporate Investment Banker Strain: Low Risk  (07/19/2017)   Overall Financial Resource Strain (CARDIA)    Difficulty of Paying Living Expenses: Not hard at all  Food Insecurity: No Food Insecurity (02/07/2023)   Hunger Vital Sign    Worried About Running Out of Food in the Last Year: Never true    Ran Out of Food in the Last Year: Never true  Transportation Needs: No Transportation Needs (02/07/2023)   PRAPARE - Administrator, Civil Service (Medical): No    Lack of Transportation (Non-Medical): No  Physical Activity: Insufficiently Active (07/19/2017)   Exercise Vital Sign    Days of Exercise per Week: 7 days    Minutes of Exercise per Session: 20 min  Stress: Stress Concern Present (07/19/2017)   [employer] of Occupational Health - Occupational Stress Questionnaire    Feeling of Stress : To some extent  Social Connections: Moderately Integrated (02/07/2023)   Social Connection and Isolation Panel [NHANES]    Frequency of Communication with Friends and Family: Three times a week    Frequency of Social Gatherings with Friends and Family: More than three times a week    Attends Religious Services: More than 4 times per year    Active Member of [employer] or Organizations: No    Attends Banker Meetings: Never    Marital Status: Married  Catering Manager Violence: Not At Risk (02/07/2023)   Humiliation, Afraid, Rape, and Kick questionnaire    Fear of Current or Ex-Partner: No    Emotionally Abused: No    Physically Abused: No    Sexually Abused: No   Family History  Problem Relation Age of Onset   Heart disease Mother    Heart disease Father    Heart disease Brother     Current Outpatient Medications   Medication Sig Dispense Refill   acetaminophen  (TYLENOL ) 500 MG tablet Take 500 mg by mouth every morning.     albuterol  (VENTOLIN  HFA) 108 (90 Base) MCG/ACT inhaler Inhale 2 puffs into the lungs every 6 (six) hours as needed for wheezing or shortness of breath. 8 g 2   apixaban  (ELIQUIS ) 5 MG TABS tablet Take 2 tablets (10 mg total) by mouth 2 (two) times daily for 5 days, THEN 1 tablet (5 mg total) 2 (two) times daily. 60 tablet 1   atorvastatin  (LIPITOR) 40 MG tablet TAKE 1 TABLET ONCE DAILY.  90 tablet 3   Cholecalciferol 1.25 MG (50000 UT) capsule Take 50,000 Units by mouth every Friday.     diclofenac  Sodium (VOLTAREN ) 1 % GEL Apply 2 g topically 4 (four) times daily as needed (arthritis pain).     donepezil  (ARICEPT ) 10 MG tablet TAKE ONE TABLET BY MOUTH AT BEDTIME 90 tablet 3   famotidine  (PEPCID ) 20 MG tablet Take 20 mg by mouth every morning.     fexofenadine  (ALLEGRA ) 180 MG tablet Take 1 tablet (180 mg total) by mouth daily. 30 tablet 2   Fluticasone -Umeclidin-Vilant (TRELEGY ELLIPTA ) 200-62.5-25 MCG/ACT AEPB Inhale 1 puff into the lungs daily. 1 each 5   furosemide  (LASIX ) 40 MG tablet Take 1 tablet (40 mg total) by mouth every Monday, Wednesday, and Friday.     guaiFENesin  (MUCINEX ) 600 MG 12 hr tablet Take 1 tablet (600 mg total) by mouth 2 (two) times daily. 60 tablet 0   levalbuterol  (XOPENEX ) 0.31 MG/3ML nebulizer solution Take 3 mLs (0.31 mg total) by nebulization 2 (two) times daily for 3 days. (Patient not taking: Reported on 02/07/2023) 18 mL 0   memantine  (NAMENDA ) 10 MG tablet TAKE 1 TABLET BY MOUTH TWICE DAILY. 60 tablet 11   metoprolol  succinate (TOPROL -XL) 50 MG 24 hr tablet Take 1 tablet (50 mg total) by mouth daily. Take with or immediately following a meal. 30 tablet 1   oxymetazoline  (AFRIN NASAL SPRAY) 0.05 % nasal spray Place 2 sprays into both nostrils daily as needed for congestion. Prn for epistaxis     polyethylene glycol (MIRALAX  / GLYCOLAX ) 17 g packet Take 17  g by mouth daily.     potassium chloride  SA (KLOR-CON  M) 20 MEQ tablet Take 1 tablet (20 mEq total) by mouth every Monday, Wednesday, and Friday for 1 dose.     saline (AYR) GEL Place 1 Application into both nostrils 2 (two) times daily.     sodium chloride  (OCEAN) 0.65 % SOLN nasal spray Place 1 spray into both nostrils as needed for congestion.     vitamin B-12 (CYANOCOBALAMIN ) 1000 MCG tablet Take 1 tablet (1,000 mcg total) by mouth daily. 90 tablet 1   No current facility-administered medications for this visit.    Allergies  Allergen Reactions   Trimethoprim     Per matrix   Amlodipine Besylate Other (See Comments)    Edema   Lisinopril Cough    Cough    Sulfamethoxazole-Trimethoprim Other (See Comments)    Unknown reaction      REVIEW OF SYSTEMS:  [X]  denotes positive finding, [ ]  denotes negative finding Cardiac  Comments:  Chest pain or chest pressure:    Shortness of breath upon exertion:    Short of breath when lying flat:    Irregular heart rhythm:        Vascular    Pain in calf, thigh, or hip brought on by ambulation:    Pain in feet at night that wakes you up from your sleep:     Blood clot in your veins:    Leg swelling:         Pulmonary    Oxygen  at home:    Productive cough:     Wheezing:         Neurologic    Sudden weakness in arms or legs:     Sudden numbness in arms or legs:     Sudden onset of difficulty speaking or slurred speech:    Temporary loss of vision in one eye:  Problems with dizziness:         Gastrointestinal    Blood in stool:     Vomited blood:         Genitourinary    Burning when urinating:     Blood in urine:        Psychiatric    Major depression:         Hematologic    Bleeding problems:    Problems with blood clotting too easily:        Skin    Rashes or ulcers:        Constitutional    Fever or chills:      PHYSICAL EXAMINATION:  There were no vitals filed for this visit.  General:  WDWN in NAD;  vital signs documented above Gait: Not observed HENT: WNL, normocephalic Pulmonary: normal non-labored breathing , without wheezing Cardiac: regular HR Abdomen: soft, NT, no masses Skin: without rashes Vascular Exam/Pulses:  Right Left  Radial 2+ (normal) 2+ (normal)  Ulnar    Femoral    Popliteal    DP 1+ (weak) 1+ (weak)  PT     Extremities: without ischemic changes, without Gangrene , without cellulitis; without open wounds;  Musculoskeletal: no muscle wasting or atrophy  Neurologic: A&O X 3;  No focal weakness or paresthesias are detected Psychiatric:  The pt has Normal affect.   Non-Invasive Vascular Imaging:   +---------+---------------+---------+-----------+---------------+----------  ----+  LEFT    CompressibilityPhasicitySpontaneityProperties     Thrombus  Aging  +---------+---------------+---------+-----------+---------------+----------  ----+  CFV     None           No       No         brightly       Acute                                                        echogenic                       +---------+---------------+---------+-----------+---------------+----------  ----+  SFJ     None           No       No         brightly       Acute                                                        echogenic                       +---------+---------------+---------+-----------+---------------+----------  ----+  FV Prox  None           No       No         brightly       Acute  echogenic                       +---------+---------------+---------+-----------+---------------+----------  ----+  FV Mid   None           No       No         dilated        Acute            +---------+---------------+---------+-----------+---------------+----------  ----+  FV DistalNone           No       No         dilated        Acute             +---------+---------------+---------+-----------+---------------+----------  ----+  PFV     None           No       No         brightly       Acute                                                        echogenic                       +---------+---------------+---------+-----------+---------------+----------  ----+  POP     Partial        Yes      Yes        dilated        Acute            +---------+---------------+---------+-----------+---------------+----------  ----+  PTV     None                    No                        Age                                                                          Indeterminate   +---------+---------------+---------+-----------+---------------+----------  ----+  PERO                                                      Not  Visualized  +---------+---------------+---------+-----------+---------------+----------  ----+  GSV     Partial                 Yes                                        +---------+---------------+---------+-----------+---------------+----------  ----+     ASSESSMENT/PLAN: Kelsey Salinas is a 89 y.o. female presenting with extensive left lower extremity DVT extending from the common femoral vein into the tibial veins.  The external leg vein was  not evaluated.  Regardless, Kelsey Salinas is 68, nonambulatory with significant systolic and diastolic heart failure.  I do not think she would have significant benefit from undergoing suction thrombectomy.  Her son agrees.  At this time, she will be treated medically.  She was sized for compression stockings today in our office.  We discussed the importance of elevation and compression.  She should be on DOAC lifelong as this occurred due to immobility.  Kelsey Salinas can follow-up with me as needed.  Fonda FORBES Rim, MD [facility] [phone] Total time of patient care including pre-visit research, consultation, and  documentation greater than 45 minutes

## 2023-02-17 ENCOUNTER — Encounter: Payer: Self-pay | Admitting: Vascular Surgery

## 2023-02-17 ENCOUNTER — Ambulatory Visit: Payer: Medicare Other | Admitting: Vascular Surgery

## 2023-02-17 VITALS — BP 150/86 | HR 79 | Temp 97.9°F | Resp 20 | Wt 166.0 lb

## 2023-02-17 DIAGNOSIS — R531 Weakness: Secondary | ICD-10-CM | POA: Diagnosis not present

## 2023-02-17 DIAGNOSIS — R278 Other lack of coordination: Secondary | ICD-10-CM | POA: Diagnosis not present

## 2023-02-17 DIAGNOSIS — J9601 Acute respiratory failure with hypoxia: Secondary | ICD-10-CM | POA: Diagnosis not present

## 2023-02-17 DIAGNOSIS — M62562 Muscle wasting and atrophy, not elsewhere classified, left lower leg: Secondary | ICD-10-CM | POA: Diagnosis not present

## 2023-02-17 DIAGNOSIS — R2689 Other abnormalities of gait and mobility: Secondary | ICD-10-CM | POA: Diagnosis not present

## 2023-02-17 DIAGNOSIS — M62561 Muscle wasting and atrophy, not elsewhere classified, right lower leg: Secondary | ICD-10-CM | POA: Diagnosis not present

## 2023-02-17 DIAGNOSIS — I82412 Acute embolism and thrombosis of left femoral vein: Secondary | ICD-10-CM | POA: Diagnosis not present

## 2023-02-18 DIAGNOSIS — R278 Other lack of coordination: Secondary | ICD-10-CM | POA: Diagnosis not present

## 2023-02-18 DIAGNOSIS — M62561 Muscle wasting and atrophy, not elsewhere classified, right lower leg: Secondary | ICD-10-CM | POA: Diagnosis not present

## 2023-02-18 DIAGNOSIS — R531 Weakness: Secondary | ICD-10-CM | POA: Diagnosis not present

## 2023-02-18 DIAGNOSIS — J9601 Acute respiratory failure with hypoxia: Secondary | ICD-10-CM | POA: Diagnosis not present

## 2023-02-18 DIAGNOSIS — M62562 Muscle wasting and atrophy, not elsewhere classified, left lower leg: Secondary | ICD-10-CM | POA: Diagnosis not present

## 2023-02-18 DIAGNOSIS — R2689 Other abnormalities of gait and mobility: Secondary | ICD-10-CM | POA: Diagnosis not present

## 2023-02-21 ENCOUNTER — Encounter: Payer: Self-pay | Admitting: Internal Medicine

## 2023-02-21 ENCOUNTER — Ambulatory Visit (INDEPENDENT_AMBULATORY_CARE_PROVIDER_SITE_OTHER): Payer: Medicare Other | Admitting: Family

## 2023-02-21 ENCOUNTER — Encounter (HOSPITAL_BASED_OUTPATIENT_CLINIC_OR_DEPARTMENT_OTHER): Payer: Self-pay | Admitting: Family

## 2023-02-21 ENCOUNTER — Non-Acute Institutional Stay (SKILLED_NURSING_FACILITY): Payer: Self-pay | Admitting: Internal Medicine

## 2023-02-21 VITALS — BP 128/70 | HR 73 | Ht 68.0 in | Wt 167.0 lb

## 2023-02-21 DIAGNOSIS — R531 Weakness: Secondary | ICD-10-CM | POA: Diagnosis not present

## 2023-02-21 DIAGNOSIS — N1831 Chronic kidney disease, stage 3a: Secondary | ICD-10-CM

## 2023-02-21 DIAGNOSIS — I5032 Chronic diastolic (congestive) heart failure: Secondary | ICD-10-CM | POA: Diagnosis not present

## 2023-02-21 DIAGNOSIS — I4719 Other supraventricular tachycardia: Secondary | ICD-10-CM

## 2023-02-21 DIAGNOSIS — J189 Pneumonia, unspecified organism: Secondary | ICD-10-CM

## 2023-02-21 DIAGNOSIS — F03B0 Unspecified dementia, moderate, without behavioral disturbance, psychotic disturbance, mood disturbance, and anxiety: Secondary | ICD-10-CM | POA: Diagnosis not present

## 2023-02-21 DIAGNOSIS — I10 Essential (primary) hypertension: Secondary | ICD-10-CM | POA: Diagnosis not present

## 2023-02-21 DIAGNOSIS — E782 Mixed hyperlipidemia: Secondary | ICD-10-CM

## 2023-02-21 DIAGNOSIS — R2689 Other abnormalities of gait and mobility: Secondary | ICD-10-CM | POA: Diagnosis not present

## 2023-02-21 DIAGNOSIS — J449 Chronic obstructive pulmonary disease, unspecified: Secondary | ICD-10-CM

## 2023-02-21 DIAGNOSIS — I5031 Acute diastolic (congestive) heart failure: Secondary | ICD-10-CM

## 2023-02-21 DIAGNOSIS — J849 Interstitial pulmonary disease, unspecified: Secondary | ICD-10-CM

## 2023-02-21 DIAGNOSIS — I25118 Atherosclerotic heart disease of native coronary artery with other forms of angina pectoris: Secondary | ICD-10-CM

## 2023-02-21 DIAGNOSIS — I82412 Acute embolism and thrombosis of left femoral vein: Secondary | ICD-10-CM

## 2023-02-21 DIAGNOSIS — E785 Hyperlipidemia, unspecified: Secondary | ICD-10-CM

## 2023-02-21 DIAGNOSIS — J9601 Acute respiratory failure with hypoxia: Secondary | ICD-10-CM | POA: Diagnosis not present

## 2023-02-21 DIAGNOSIS — R278 Other lack of coordination: Secondary | ICD-10-CM | POA: Diagnosis not present

## 2023-02-21 DIAGNOSIS — M62562 Muscle wasting and atrophy, not elsewhere classified, left lower leg: Secondary | ICD-10-CM | POA: Diagnosis not present

## 2023-02-21 DIAGNOSIS — M62561 Muscle wasting and atrophy, not elsewhere classified, right lower leg: Secondary | ICD-10-CM | POA: Diagnosis not present

## 2023-02-21 NOTE — Progress Notes (Signed)
 Cardiology Office Note:  .   Date:  02/21/2023  ID:  Kelsey Salinas, DOB 12-Jul-1926, MRN [id number] PCP: Kelsey Shiley, MD  [facility] Providers Cardiologist:  Kelsey Donning, MD    History of Present Illness: .   Kelsey Salinas is a 89 y.o. female with history of Alzheimer's dementia, diastolic heart failure, ILD, hyperlipidemia, arthritis, osteoporosis, CKD, HTN  Previous patient of Kelsey Salinas having since established with Dr. Theodis Salinas.  Admitted 1/9 - 01/24/2023 with acute hypoxic respiratory failure in the setting of pneumonia and acute diastolic heart failure exacerbation.  Admitted 1/26-1/29/25 with LLE DVT after presenting with LLE swelling.  LE Doppler DVT involving left common femoral, SFJ, femoral vein, popliteal vein.  Started on heparin  and transition to Eliquis .  Known chronically elevated troponin felt to be secondary to CKD which was asymptomatic.  [name]  was discontinued due to UTI.  Losartan  discontinued due to mild AKI.  Recommended to resume Lasix  to 3/25 in outpatient setting 40 mg Monday, Wednesday, Friday.  Due to bradycardia Toprol  was reduced from twice daily to daily.  She saw [facility] 02/17/2023 who recommended medical management and compression stockings, elevation. Recommended for lifelong OAC as occurred due to immobility.  Presents today for follow up with staff member from [facility]. Son present via phone. Reports no shortness of breath nor dyspnea on exertion. Overall sedentary at home. Reports no chest pain, pressure, or tightness. No  orthopnea, PND. Reports no palpitations.  Her LE edema is much improved with compression stockings. Staff member and son note congested sounding cough ongoing for 1 month. Miss Kelsey Salinas reports no wheeze, productive cough.    ROS: Please see the history of present illness.    All other systems reviewed and are negative.   Studies Reviewed: .        Cardiac Studies & Procedures       ECHOCARDIOGRAM  ECHOCARDIOGRAM COMPLETE 01/21/2023  Narrative ECHOCARDIOGRAM REPORT    Patient Name:   Kelsey Salinas Date of Exam: 01/21/2023 Medical Rec #:  [id number]       Height:       68.0 in Accession #:    [id number]      Weight:       159.4 lb Date of Birth:  24-Jun-1926       BSA:          1.856 m Patient Age:    89 years        BP:           164/89 mmHg Patient Gender: F               HR:           70 bpm. Exam Location:  Inpatient  Procedure: 2D Echo, Cardiac Doppler and Color Doppler  Indications:    Congestive Heart Failure  History:        Patient has prior history of Echocardiogram examinations, most recent 09/10/2021. Arrythmias:PAC and PVC; Risk Factors:Hypertension, Dyslipidemia and Former Smoker.  Sonographer:    Kelsey Salinas Referring Phys: [id number] Kelsey Salinas  IMPRESSIONS   1. Left ventricular ejection fraction, by estimation, is 60 to 65%. The left ventricle has normal function. The left ventricle has no regional wall motion abnormalities. There is mild concentric left ventricular hypertrophy. Left ventricular diastolic parameters are consistent with Grade I diastolic dysfunction (impaired relaxation). 2. Peak RV-RA gradient 67 mmHg. Right ventricular systolic function is normal. The right ventricular size is normal. 3. Left atrial size was severely dilated.  4. The mitral valve is normal in structure. Trivial mitral valve regurgitation. No evidence of mitral stenosis. 5. The aortic valve is tricuspid. Aortic valve regurgitation is trivial. No aortic stenosis is present. 6. The IVC was not well-visualized.  FINDINGS Left Ventricle: Left ventricular ejection fraction, by estimation, is 60 to 65%. The left ventricle has normal function. The left ventricle has no regional wall motion abnormalities. The left ventricular internal cavity size was normal in size. There is mild concentric left ventricular hypertrophy. Left ventricular diastolic parameters are  consistent with Grade I diastolic dysfunction (impaired relaxation).  Right Ventricle: Peak RV-RA gradient 67 mmHg. The right ventricular size is normal. No increase in right ventricular wall thickness. Right ventricular systolic function is normal.  Left Atrium: Left atrial size was severely dilated.  Right Atrium: Right atrial size was normal in size.  Pericardium: There is no evidence of pericardial effusion.  Mitral Valve: The mitral valve is normal in structure. Trivial mitral valve regurgitation. No evidence of mitral valve stenosis.  Tricuspid Valve: The tricuspid valve is normal in structure. Tricuspid valve regurgitation is mild.  Aortic Valve: The aortic valve is tricuspid. Aortic valve regurgitation is trivial. Aortic regurgitation PHT measures 449 msec. No aortic stenosis is present. Aortic valve mean gradient measures 3.0 mmHg. Aortic valve peak gradient measures 6.1 mmHg. Aortic valve area, by VTI measures 2.60 cm.  Pulmonic Valve: The pulmonic valve was normal in structure. Pulmonic valve regurgitation is trivial.  Aorta: The aortic root is normal in size and structure.  Venous: The IVC was not well-visualized. The inferior vena cava was not well visualized.  IAS/Shunts: No atrial level shunt detected by color flow Doppler.   LEFT VENTRICLE PLAX 2D LVIDd:         4.40 cm   Diastology LVIDs:         2.60 cm   LV e' medial:    2.94 cm/s LV PW:         1.00 cm   LV E/e' medial:  21.2 LV IVS:        1.30 cm   LV e' lateral:   6.09 cm/s LVOT diam:     2.10 cm   LV E/e' lateral: 10.2 LV SV:         51 LV SV Index:   27 LVOT Area:     3.46 cm   RIGHT VENTRICLE RV Basal diam:  3.70 cm RV S prime:     14.00 cm/s TAPSE (M-mode): 2.3 cm  LEFT ATRIUM             Index        RIGHT ATRIUM           Index LA diam:        4.70 cm 2.53 cm/m   RA Area:     16.90 cm LA Vol (A2C):   93.1 ml 50.17 ml/m  RA Volume:   42.80 ml  23.06 ml/m LA Vol (A4C):   97.5 ml 52.54  ml/m LA Biplane Vol: 96.2 ml 51.84 ml/m AORTIC VALVE AV Area (Vmax):    2.61 cm AV Area (Vmean):   2.75 cm AV Area (VTI):     2.60 cm AV Vmax:           123.00 cm/s AV Vmean:          83.800 cm/s AV VTI:            0.196 m AV Peak Grad:  6.1 mmHg AV Mean Grad:      3.0 mmHg LVOT Vmax:         92.80 cm/s LVOT Vmean:        66.500 cm/s LVOT VTI:          0.147 m LVOT/AV VTI ratio: 0.75 AI PHT:            449 msec  AORTA Ao Root diam: 3.70 cm Ao Asc diam:  3.00 cm  MITRAL VALVE               TRICUSPID VALVE MV Area (PHT): 4.19 cm    TR Peak grad:   66.9 mmHg MV Decel Time: 181 msec    TR Vmax:        409.00 cm/s MV E velocity: 62.20 cm/s MV A velocity: 81.20 cm/s  SHUNTS MV E/A ratio:  0.77        Systemic VTI:  0.15 m Systemic Diam: 2.10 cm  Dalton McleanMD Electronically signed by Archer Bear Signature Date/Time: 01/21/2023/9:44:13 AM    Final             Risk Assessment/Calculations:             Physical Exam:   VS:  BP 128/70   Pulse 73   Ht 5\' 8"  (1.727 m)   Wt 167 lb (75.8 kg)   SpO2 96%   BMI 25.39 kg/m    Wt Readings from Last 3 Encounters:  02/21/23 167 lb (75.8 kg)  02/21/23 165 lb 3.2 oz (74.9 kg)  02/17/23 166 lb (75.3 kg)    GEN: Well nourished, well developed in no acute distress NECK: No JVD; No carotid bruits CARDIAC: RRR, no murmurs, rubs, gallops RESPIRATORY:  Clear to auscultation without rales, wheezing or rhonchi  ABDOMEN: Soft, non-tender, non-distended EXTREMITIES:  No edema; No deformity   ASSESSMENT AND PLAN: .    Hx of PNA / Cough / Emphysema - Family and SNF staff report 1 month history of congested sounding cough. Anticipate continued recovery from PNA. Lung sounds clear on exam. Reports no dyspnea. Recommend utilization of incentive spirometer twice per day. Follow up with pulmonology Dr. Waylan Haggard as scheduled 02/25/23.   Chronic diastolic heart failure - Euvolemic and well compensated on exam. GDMT Toprol  50mg   daily, Lasix  40mg  M/W/F with potassium. Labs 02/15/23 stable renal function, normal potassium. Low sodium diet, fluid restriction <2L encouraged.   LEE DVT/OAC - Continue Eliquis  5mg  BID (does not meet dose reduction criteria). Denies bleeding complications. Per [facility], plan for conservative management with OAC, elevation, compression stockings.   HTN - BP well controlled. Continue current antihypertensive regimen Toprol  50 mg daily.  CKD 3A- Careful titration of diuretic and antihypertensive.    Coronary calcification on CT / HLD- Stable with no anginal symptoms. No indication for ischemic evaluation.  GDMT Atorvastatin  40mg  daily, Toprol  50mg  daily. No ASA due to OAC.  Paroxysmal atrial tachycardia- No palpitations. Heart rate controlled with Toprol  50mg  daily. Had bradycardia on higher doses.        Dispo: follow up in 6 months  Signed, Clearnce Curia, NP

## 2023-02-21 NOTE — Patient Instructions (Signed)
 Medication Instructions:  Your physician recommends that you continue on your current medications as directed. Please refer to the Current Medication list given to you today.  Follow-Up: At [facility], you and your health needs are our priority.  As part of our continuing mission to provide you with exceptional heart care, we have created designated Provider Care Teams.  These Care Teams include your primary Cardiologist (physician) and Advanced Practice Providers (APPs -  Physician Assistants and Nurse Practitioners) who all work together to provide you with the care you need, when you need it.  We recommend signing up for the patient portal called "MyChart".  Sign up information is provided on this After Visit Summary.  MyChart is used to connect with patients for Virtual Visits (Telemedicine).  Patients are able to view lab/test results, encounter notes, upcoming appointments, etc.  Non-urgent messages can be sent to your provider as well.   To learn more about what you can do with MyChart, go to [email or web address].    Your next appointment:   Follow up in 6 months with Dr. Veryl Gottron   Other Instructions Please use your incentive spirometer two times per day

## 2023-02-21 NOTE — Progress Notes (Signed)
 Provider:   Location:  [facility] Room Number: 158A Place of Service:  SNF (31)  PCP: Kelsey Gammon, MD Patient Care Team: Kelsey Gammon, MD as PCP - General (Internal Medicine) Kelsey Red, MD as PCP - Cardiology (Cardiology) Kelsey Gross, MD as Consulting Physician (Orthopedic Surgery) Kelsey Records, MD (Inactive) as Consulting Physician (Cardiology) Kelsey Flow, MD as Consulting Physician (Ophthalmology) Kelsey Marvel, MD as Consulting Physician (Orthopedic Surgery)  Extended Emergency Contact Information Primary Emergency Contact: Kelsey [facility] [place] of [place] Home Phone: [phone] Mobile Phone: [phone] Relation: Daughter Secondary Emergency Contact: Kelsey [facility] Home Phone: [phone] Mobile Phone: [phone] Relation: Niece  Code Status: Full Code Goals of Care: Advanced Directive information    02/07/2023    5:32 PM  Advanced Directives  Does Patient Have a Medical Advance Directive? No  Would patient like information on creating a medical advance directive? No - Patient declined      Chief Complaint  Patient presents with   New Admit To SNF    Patient is being seen for an admission    HPI: Patient is a 89 y.o. female seen today for readmission to  Rehab  Patient was admitted in the [facility] from 1//26-1/29 for acute DVT  .  Patient lives in [place] with 24/7 care She was staying in rehab after her previous admission for With respiratory failure.  Patient has a history of Alzheimer's dementia, diastolic CHF, HLD, arthritis, history of osteoporosis, allergic rhinitis  And recent diagnosis of ILD Refused treatment or further work up  Patient was sent to [facility] from rehab when she was noticed to have acute swelling of her left leg.  The Dopplers done showed evidence of DVT involving left femoral wall point as well as popliteal vein.  Also had SVT in the left great saphenous  vein She was started on Eliquis Patient is doing well in rehab.  She is still needing 2 to 3 L of oxygen to keep her sats up.  She is also needing higher and more assist for transfers.  Working with physical therapy to get stronger.  Per the caregiver in the room she is having some cough.    Past Medical History:  Diagnosis Date   Abscess of right leg 12/24/2020   Childhood asthma    Chronic diastolic heart failure (HCC) 12/25/2012   October 2013 echocardiogram 1. There is moderate concentric left ventricle hypertrophy. 2. Left ventricular ejection fraction estimated by 2D at 60-65 percent. 3. There were no regional wall motion abnormalities. 4. Mild mitral annular calcification. 5. Mild mitral valve regurgitation. 6. There is mild tricuspid regurgitation. 7. Mildly elevated estimated right ventricular systolic pressure. 8. Right ventricular systolic pressure estimated at 35-40 mm Hg. 9. Mild calcification of the aortic valve. 10. Trace aortic valve regurgitation. 11. Analysis of mitral valve inflow, pulmonary vein Doppler and tissue Doppler suggests grade I diastolic dysfunction without elevated left atrial pressure.    Colon polyp    Diverticular stricture (HCC) 2009   Diverticulosis 10/30/2007   DM (diabetes mellitus), type 2 (HCC)    Edema 09/12/2013   Fall 05/18/2017   tripped over wheel of walker; superior and inferior pubic ramus fractures    Hypercholesterolemia    Hypertension    Hypertension, essential    Hypothyroidism    as a teenager   Indigestion    Insomnia    OA (osteoarthritis) of hip    right hip replacement, OA in bilateral knees   Osteopenia  Actonel stopped in 2012 after 5years therapy   Pneumonia 1939   "hospitalized"   Rapid resting heart rate 12/25/2012   Uncertain cause. Possible ectopic atrial tachycardia. Date entered 12/25/12    Right rotator cuff tear    Seasonal allergies    Unsteadiness on feet    Past Surgical History:  Procedure Laterality  Date   CATARACT EXTRACTION W/ INTRAOCULAR LENS  IMPLANT, BILATERAL Bilateral    COLONOSCOPY  2009   Dr. Melchor [facility]   DILATION AND CURETTAGE OF UTERUS     FRACTURE SURGERY     JOINT REPLACEMENT     ORIF WRIST FRACTURE Right    REDUCTION MAMMAPLASTY  1999   TOTAL HIP ARTHROPLASTY Right 03-04-08   Dr. Lequita [facility]    reports that she has quit smoking. Her smoking use included cigarettes. She has a 6 pack-year smoking history. She has never used smokeless tobacco. She reports current alcohol use. She reports that she does not use drugs. Social History   Socioeconomic History   Marital status: Married    Spouse name: Not on file   Number of children: Not on file   Years of education: Not on file   Highest education level: Not on file  Occupational History   Occupation: retired travel agent  Tobacco Use   Smoking status: Former    Current packs/day: 0.12    Average packs/day: 0.1 packs/day for 50.0 years (6.0 ttl pk-yrs)    Types: Cigarettes   Smokeless tobacco: Never   Tobacco comments:    "stopped in the ate 1990s"  Vaping Use   Vaping status: Never Used  Substance and Sexual Activity   Alcohol use: Yes    Comment: rarely   Drug use: Never   Sexual activity: Not Currently  Other Topics Concern   Not on file  Social History Narrative   Lives at [place] since 2010   Husband Kelsey [facility] married 1952 (husband in memory care unit at [facility])   Former smoker 1/2 PPD, for 30 years, stopped 01/10/1989   Alcohol 2 glasses of wine at night   Exercise: machines 3 times a week and daily walk   No POA/LW         Social Drivers of Corporate investment banker Strain: Low Risk  (07/19/2017)   Overall Financial Resource Strain (CARDIA)    Difficulty of Paying Living Expenses: Not hard at all  Food Insecurity: No Food Insecurity (02/07/2023)   Hunger Vital Sign    Worried About Running Out of Food in the Last Year: Never true    Ran Out of Food in the Last Year: Never true  Transportation  Needs: No Transportation Needs (02/07/2023)   PRAPARE - Administrator, Civil Service (Medical): No    Lack of Transportation (Non-Medical): No  Physical Activity: Insufficiently Active (07/19/2017)   Exercise Vital Sign    Days of Exercise per Week: 7 days    Minutes of Exercise per Session: 20 min  Stress: Stress Concern Present (07/19/2017)   [employer] of Occupational Health - Occupational Stress Questionnaire    Feeling of Stress : To some extent  Social Connections: Moderately Integrated (02/07/2023)   Social Connection and Isolation Panel [NHANES]    Frequency of Communication with Friends and Family: Three times a week    Frequency of Social Gatherings with Friends and Family: More than three times a week    Attends Religious Services: More than 4 times per year    Active Member  of Clubs or Organizations: No    Attends Banker Meetings: Never    Marital Status: Married  Catering manager Violence: Not At Risk (02/07/2023)   Humiliation, Afraid, Rape, and Kick questionnaire    Fear of Current or Ex-Partner: No    Emotionally Abused: No    Physically Abused: No    Sexually Abused: No    Functional Status Survey:    Family History  Problem Relation Age of Onset   Heart disease Mother    Heart disease Father    Heart disease Brother     Health Maintenance  Topic Date Due   OPHTHALMOLOGY EXAM  07/11/2020   FOOT EXAM  05/04/2023   Medicare Annual Wellness (AWV)  05/14/2023   HEMOGLOBIN A1C  07/18/2023   DTaP/Tdap/Td (2 - Td or Tdap) 07/20/2027   Pneumonia Vaccine 49+ Years old  Completed   INFLUENZA VACCINE  Completed   DEXA SCAN  Completed   COVID-19 Vaccine  Completed   Zoster Vaccines- Shingrix  Completed   HPV VACCINES  Aged Out    Allergies  Allergen Reactions   Trimethoprim     Per matrix   Amlodipine Besylate Other (See Comments)    Edema   Lisinopril Cough    Cough    Sulfamethoxazole-Trimethoprim Other (See Comments)     Unknown reaction     Outpatient Encounter Medications as of 02/21/2023  Medication Sig   acetaminophen (TYLENOL) 500 MG tablet Take 500 mg by mouth every morning.   albuterol (VENTOLIN HFA) 108 (90 Base) MCG/ACT inhaler Inhale 2 puffs into the lungs every 6 (six) hours as needed for wheezing or shortness of breath.   apixaban (ELIQUIS) 5 MG TABS tablet Take 2 tablets (10 mg total) by mouth 2 (two) times daily for 5 days, THEN 1 tablet (5 mg total) 2 (two) times daily.   atorvastatin (LIPITOR) 40 MG tablet TAKE 1 TABLET ONCE DAILY.   Cholecalciferol 1.25 MG (50000 UT) capsule Take 50,000 Units by mouth every Friday.   diclofenac Sodium (VOLTAREN) 1 % GEL Apply 2 g topically 4 (four) times daily as needed (arthritis pain).   donepezil (ARICEPT) 10 MG tablet TAKE ONE TABLET BY MOUTH AT BEDTIME   famotidine (PEPCID) 20 MG tablet Take 20 mg by mouth every morning.   fexofenadine (ALLEGRA) 180 MG tablet Take 1 tablet (180 mg total) by mouth daily.   Fluticasone-Umeclidin-Vilant (TRELEGY ELLIPTA) 200-62.5-25 MCG/ACT AEPB Inhale 1 puff into the lungs daily.   furosemide (LASIX) 40 MG tablet Take 1 tablet (40 mg total) by mouth every Monday, Wednesday, and Friday.   guaiFENesin (MUCINEX) 600 MG 12 hr tablet Take 1 tablet (600 mg total) by mouth 2 (two) times daily.   memantine (NAMENDA) 10 MG tablet TAKE 1 TABLET BY MOUTH TWICE DAILY.   metoprolol succinate (TOPROL-XL) 50 MG 24 hr tablet Take 1 tablet (50 mg total) by mouth daily. Take with or immediately following a meal.   oxymetazoline (AFRIN NASAL SPRAY) 0.05 % nasal spray Place 2 sprays into both nostrils daily as needed for congestion. Prn for epistaxis   polyethylene glycol (MIRALAX / GLYCOLAX) 17 g packet Take 17 g by mouth daily.   saline (AYR) GEL Place 1 Application into both nostrils 2 (two) times daily.   sodium chloride (OCEAN) 0.65 % SOLN nasal spray Place 1 spray into both nostrils as needed for congestion.   vitamin B-12  (CYANOCOBALAMIN) 1000 MCG tablet Take 1 tablet (1,000 mcg total) by mouth daily.  levalbuterol (XOPENEX) 0.31 MG/3ML nebulizer solution Take 3 mLs (0.31 mg total) by nebulization 2 (two) times daily for 3 days. (Patient not taking: Reported on 02/07/2023)   potassium chloride SA (KLOR-CON M) 20 MEQ tablet Take 1 tablet (20 mEq total) by mouth every Monday, Wednesday, and Friday for 1 dose.   No facility-administered encounter medications on file as of 02/21/2023.    Review of Systems  Constitutional:  Negative for activity change and appetite change.  HENT: Negative.    Respiratory:  Positive for cough and shortness of breath.   Cardiovascular:  Negative for leg swelling.  Gastrointestinal:  Negative for constipation.  Genitourinary: Negative.   Musculoskeletal:  Positive for gait problem. Negative for arthralgias and myalgias.  Skin: Negative.   Neurological:  Negative for dizziness and weakness.  Psychiatric/Behavioral:  Positive for confusion. Negative for dysphoric mood and sleep disturbance.     Vitals:   02/21/23 0913  BP: 119/76  Pulse: 72  Resp: 16  Temp: 99 F (37.2 C)  TempSrc: Temporal  SpO2: 91%  Weight: 165 lb 3.2 oz (74.9 kg)  Height: 5\' 8"  (1.727 m)   Body mass index is 25.12 kg/m. Physical Exam Vitals reviewed.  Constitutional:      Appearance: Normal appearance.  HENT:     Head: Normocephalic.     Nose: Nose normal.     Mouth/Throat:     Mouth: Mucous membranes are moist.     Pharynx: Oropharynx is clear.  Eyes:     Pupils: Pupils are equal, round, and reactive to light.  Cardiovascular:     Rate and Rhythm: Normal rate and regular rhythm.     Pulses: Normal pulses.     Heart sounds: Normal heart sounds. No murmur heard. Pulmonary:     Effort: Pulmonary effort is normal.     Breath sounds: Rales present.  Abdominal:     General: Abdomen is flat. Bowel sounds are normal.     Palpations: Abdomen is soft.  Musculoskeletal:        General: No  swelling.     Cervical back: Neck supple.  Skin:    General: Skin is warm.  Neurological:     General: No focal deficit present.     Mental Status: She is alert and oriented to person, place, and time.  Psychiatric:        Mood and Affect: Mood normal.        Thought Content: Thought content normal.     Labs reviewed: Basic Metabolic Panel: Recent Labs    01/20/23 0843 01/21/23 0259 02/06/23 2351 02/08/23 0902 02/09/23 0627  NA  --    < > 137 138 136  K  --    < > 4.1 4.0 4.1  CL  --    < > 105 106 105  CO2  --    < > 19* 23 19*  GLUCOSE  --    < > 151* 155* 191*  BUN  --    < > 55* 51* 49*  CREATININE  --    < > 1.56* 1.48* 1.37*  CALCIUM  --    < > 8.5* 8.6* 8.7*  MG 2.1  --   --  2.4  --    < > = values in this interval not displayed.   Liver Function Tests: Recent Labs    01/18/23 0000 01/20/23 0154 02/08/23 0902  AST 22 19 17   ALT 16 15 24   ALKPHOS 108 82 88  BILITOT  --  1.5* 0.7  PROT  --  6.8 6.1*  ALBUMIN 3.8 3.4* 2.9*   No results for input(s): "LIPASE", "AMYLASE" in the last 8760 hours. No results for input(s): "AMMONIA" in the last 8760 hours. CBC: Recent Labs    01/20/23 0154 01/20/23 0526 02/06/23 2351 02/08/23 0902 02/09/23 0627  WBC 13.6*   < > 13.4* 10.9* 10.6*  NEUTROABS 11.2*  --   --  7.8*  --   HGB 15.6*   < > 14.2 14.2 13.5  HCT 48.7*   < > 46.2* 44.5 42.4  MCV 95.9   < > 98.3 94.3 94.2  PLT 128*   < > 164 158 177   < > = values in this interval not displayed.   Cardiac Enzymes: No results for input(s): "CKTOTAL", "CKMB", "CKMBINDEX", "TROPONINI" in the last 8760 hours. BNP: Invalid input(s): "POCBNP" Lab Results  Component Value Date   HGBA1C 6.8 01/18/2023   Lab Results  Component Value Date   TSH 1.29 01/18/2023   Lab Results  Component Value Date   VITAMINB12 1,697 08/30/2019   No results found for: "FOLATE" No results found for: "IRON", "TIBC", "FERRITIN"  Imaging and Procedures obtained prior to SNF  admission: VAS [place] LOWER EXTREMITY VENOUS (DVT) (ONLY [facility] & [facility]) Result Date: 02/07/2023  Lower Venous DVT Study Patient Name:  Kelsey [facility]  Date of Exam:   02/07/2023 Medical Rec #: [id number]        Accession #:    [id number] Date of Birth: 01-05-1927        Patient Gender: F Patient Age:   14 years Exam Location:  [facility] Procedure:      VAS [place] LOWER EXTREMITY VENOUS (DVT) Referring Phys: [place] [facility] SPONSELLER --------------------------------------------------------------------------------  Indications: Swelling, and Edema.  Risk Factors: Immobility. Limitations: Patient positioning and poor ultrasound/tissue interface. Comparison Study: Novel DVT seen since previous exam 12/10/20. Performing Technologist: Shona Simpson  Examination Guidelines: A complete evaluation includes B-mode imaging, spectral Doppler, color Doppler, and power Doppler as needed of all accessible portions of each vessel. Bilateral testing is considered an integral part of a complete examination. Limited examinations for reoccurring indications may be performed as noted. The reflux portion of the exam is performed with the patient in reverse Trendelenburg.  +---------+---------------+---------+-----------+----------+-------------------+ RIGHT    CompressibilityPhasicitySpontaneityPropertiesThrombus Aging      +---------+---------------+---------+-----------+----------+-------------------+ CFV      Full           Yes      Yes                                      +---------+---------------+---------+-----------+----------+-------------------+ SFJ      Full                                                             +---------+---------------+---------+-----------+----------+-------------------+ FV Prox  Full                                                             +---------+---------------+---------+-----------+----------+-------------------+  FV Mid   Full                                                              +---------+---------------+---------+-----------+----------+-------------------+ FV DistalFull                    Yes                                      +---------+---------------+---------+-----------+----------+-------------------+ PFV      Full                                                             +---------+---------------+---------+-----------+----------+-------------------+ POP      Full           Yes      Yes                                      +---------+---------------+---------+-----------+----------+-------------------+ PTV      Full                                         Not well visualized +---------+---------------+---------+-----------+----------+-------------------+ PERO                                                  Not Visualized      +---------+---------------+---------+-----------+----------+-------------------+   +---------+---------------+---------+-----------+---------------+--------------+ LEFT     CompressibilityPhasicitySpontaneityProperties     Thrombus Aging +---------+---------------+---------+-----------+---------------+--------------+ CFV      None           No       No         brightly       Acute                                                      echogenic                     +---------+---------------+---------+-----------+---------------+--------------+ SFJ      None           No       No         brightly       Acute                                                      echogenic                     +---------+---------------+---------+-----------+---------------+--------------+  FV Prox  None           No       No         brightly       Acute                                                      echogenic                     +---------+---------------+---------+-----------+---------------+--------------+ FV Mid   None           No       No         dilated         Acute          +---------+---------------+---------+-----------+---------------+--------------+ FV DistalNone           No       No         dilated        Acute          +---------+---------------+---------+-----------+---------------+--------------+ PFV      None           No       No         brightly       Acute                                                      echogenic                     +---------+---------------+---------+-----------+---------------+--------------+ POP      Partial        Yes      Yes        dilated        Acute          +---------+---------------+---------+-----------+---------------+--------------+ PTV      None                    No                        Age                                                                       Indeterminate  +---------+---------------+---------+-----------+---------------+--------------+ PERO                                                       Not Visualized +---------+---------------+---------+-----------+---------------+--------------+ GSV      Partial                 Yes                                      +---------+---------------+---------+-----------+---------------+--------------+  Summary: RIGHT: - There is no evidence of deep vein thrombosis in the lower extremity.  - No cystic structure found in the popliteal fossa.  LEFT: - Findings consistent with acute deep vein thrombosis involving the left common femoral vein, SF junction, left femoral vein, left proximal profunda vein, and left popliteal vein. - Findings consistent with acute superficial vein thrombosis involving the left great saphenous vein.  - Findings consistent with age indeterminate deep vein thrombosis involving the left posterior tibial veins.  - No cystic structure found in the popliteal fossa.  *See table(s) above for measurements and observations. Electronically signed by Gerarda Fraction on 02/07/2023 at 5:02:09  PM.    Final     Assessment/Plan 1. Acute deep vein thrombosis (DVT) of femoral vein of left lower extremity (HCC) (Primary) On Eliquis now Vascular said no intervention needed 2. Acute heart failure with preserved ejection fraction (HCC) doing well with Lasix Was taken off [name] in the [facility] So far is doing well Will watch her volume status 3. ILD (interstitial lung disease) (HCC)/COPD Has appointment with Pulmonary Oxygen is new  Unable to taper right now On Trelegy 4. Primary hypertension Toprol  Was taken off Losartan in the [facility] 5. Mixed hyperlipidemia statin  6. Community acquired pneumonia of right upper lobe of lung Still needing oxygen  7. Moderate dementia, unspecified dementia type, unspecified whether behavioral, psychotic, or mood disturbance or anxiety (HCC) Namenda and Aricpet Also has 24 /7 caregivers  8 CKD BMP done recently shows stable Creat 9 Will be discharged to her Apartment when she is more stroger with her transfers   Family/ staff Communication:   Labs/tests ordered:

## 2023-02-22 DIAGNOSIS — J9601 Acute respiratory failure with hypoxia: Secondary | ICD-10-CM | POA: Diagnosis not present

## 2023-02-22 DIAGNOSIS — M62561 Muscle wasting and atrophy, not elsewhere classified, right lower leg: Secondary | ICD-10-CM | POA: Diagnosis not present

## 2023-02-22 DIAGNOSIS — M62562 Muscle wasting and atrophy, not elsewhere classified, left lower leg: Secondary | ICD-10-CM | POA: Diagnosis not present

## 2023-02-22 DIAGNOSIS — R2689 Other abnormalities of gait and mobility: Secondary | ICD-10-CM | POA: Diagnosis not present

## 2023-02-22 DIAGNOSIS — R531 Weakness: Secondary | ICD-10-CM | POA: Diagnosis not present

## 2023-02-22 DIAGNOSIS — R278 Other lack of coordination: Secondary | ICD-10-CM | POA: Diagnosis not present

## 2023-02-23 DIAGNOSIS — R531 Weakness: Secondary | ICD-10-CM | POA: Diagnosis not present

## 2023-02-23 DIAGNOSIS — R2689 Other abnormalities of gait and mobility: Secondary | ICD-10-CM | POA: Diagnosis not present

## 2023-02-23 DIAGNOSIS — R278 Other lack of coordination: Secondary | ICD-10-CM | POA: Diagnosis not present

## 2023-02-23 DIAGNOSIS — J9601 Acute respiratory failure with hypoxia: Secondary | ICD-10-CM | POA: Diagnosis not present

## 2023-02-23 DIAGNOSIS — M62562 Muscle wasting and atrophy, not elsewhere classified, left lower leg: Secondary | ICD-10-CM | POA: Diagnosis not present

## 2023-02-23 DIAGNOSIS — M62561 Muscle wasting and atrophy, not elsewhere classified, right lower leg: Secondary | ICD-10-CM | POA: Diagnosis not present

## 2023-02-24 ENCOUNTER — Non-Acute Institutional Stay (SKILLED_NURSING_FACILITY): Payer: Self-pay | Admitting: Adult Health

## 2023-02-24 ENCOUNTER — Encounter: Payer: Self-pay | Admitting: Adult Health

## 2023-02-24 DIAGNOSIS — I10 Essential (primary) hypertension: Secondary | ICD-10-CM

## 2023-02-24 DIAGNOSIS — M62562 Muscle wasting and atrophy, not elsewhere classified, left lower leg: Secondary | ICD-10-CM | POA: Diagnosis not present

## 2023-02-24 DIAGNOSIS — R053 Chronic cough: Secondary | ICD-10-CM | POA: Diagnosis not present

## 2023-02-24 DIAGNOSIS — J9601 Acute respiratory failure with hypoxia: Secondary | ICD-10-CM | POA: Diagnosis not present

## 2023-02-24 DIAGNOSIS — I82412 Acute embolism and thrombosis of left femoral vein: Secondary | ICD-10-CM

## 2023-02-24 DIAGNOSIS — J849 Interstitial pulmonary disease, unspecified: Secondary | ICD-10-CM | POA: Diagnosis not present

## 2023-02-24 DIAGNOSIS — J101 Influenza due to other identified influenza virus with other respiratory manifestations: Secondary | ICD-10-CM | POA: Diagnosis not present

## 2023-02-24 DIAGNOSIS — R531 Weakness: Secondary | ICD-10-CM | POA: Diagnosis not present

## 2023-02-24 DIAGNOSIS — R278 Other lack of coordination: Secondary | ICD-10-CM | POA: Diagnosis not present

## 2023-02-24 DIAGNOSIS — R2689 Other abnormalities of gait and mobility: Secondary | ICD-10-CM | POA: Diagnosis not present

## 2023-02-24 DIAGNOSIS — M62561 Muscle wasting and atrophy, not elsewhere classified, right lower leg: Secondary | ICD-10-CM | POA: Diagnosis not present

## 2023-02-24 MED ORDER — LEVALBUTEROL HCL 0.31 MG/3ML IN NEBU: 1.0000 | INHALATION_SOLUTION | Freq: Two times a day (BID) | RESPIRATORY_TRACT | Status: DC

## 2023-02-24 NOTE — Progress Notes (Signed)
Location:  Medical illustrator of Service:  SNF (31) Provider:   Peggye Ley, ANP [facility] [phone]   [name], MD  Patient Care Team: [name], MD as PCP - General (Internal Medicine) Jodelle Red, MD as PCP - Cardiology (Cardiology) Ollen Gross, MD as Consulting Physician (Orthopedic Surgery) Lyn Records, MD (Inactive) as Consulting Physician (Cardiology) Mateo Flow, MD as Consulting Physician (Ophthalmology) Salvatore Marvel, MD as Consulting Physician (Orthopedic Surgery)  Extended Emergency Contact Information Primary Emergency Contact: Ludwig Clarks [place] Home Phone: [phone] Mobile Phone: [phone] Relation: Daughter Secondary Emergency Contact: [facility] Home Phone: [phone] Mobile Phone: [phone] Relation: Niece  Code Status:  Full Goals of care: Advanced Directive information    02/21/2023    9:35 AM  Advanced Directives  Does Patient Have a Medical Advance Directive? No  Would patient like information on creating a medical advance directive? No - Patient declined     Chief Complaint  Patient presents with   Acute Visit    flu    HPI:   The patient is a 89 year old female with interstitial lung disease who presents with worsening cough after testing positive for influenza A.  On February 23, 2023, she developed a cough and nasal congestion and tested positive for influenza A. She was prescribed Tamiflu for five days and placed on isolation precautions. Despite treatment, her chronic cough has worsened. The staff at the skilled rehab facility have been administering guaifenesin and PRN albuterol, but these have not been effective. No wheezing, shortness of breath, fever, body aches, or gastrointestinal symptoms. She is eating and drinking well. The nurse has requested an alternative treatment for her cough.  She has a history of  interstitial lung disease and was scheduled to see a pulmonologist, but the appointment was rescheduled. She is currently on Trelegy for this condition.  She is on Eliquis for an acute deep vein thrombosis (DVT) found in January 2025 in the left common femoral vein. She was seen by vascular surgery on February 17, 2023, and they recommended continuing Eliquis indefinitely along with compression stockings.  She is currently residing at [facility] skilled rehab setting.  Past Medical History:  Diagnosis Date   Abscess of right leg 12/24/2020   Childhood asthma    Chronic diastolic heart failure (HCC) 12/25/2012   October 2013 echocardiogram 1. There is moderate concentric left ventricle hypertrophy. 2. Left ventricular ejection fraction estimated by 2D at 60-65 percent. 3. There were no regional wall motion abnormalities. 4. Mild mitral annular calcification. 5. Mild mitral valve regurgitation. 6. There is mild tricuspid regurgitation. 7. Mildly elevated estimated right ventricular systolic pressure. 8. Right ventricular systolic pressure estimated at 35-40 mm Hg. 9. Mild calcification of the aortic valve. 10. Trace aortic valve regurgitation. 11. Analysis of mitral valve inflow, pulmonary vein Doppler and tissue Doppler suggests grade I diastolic dysfunction without elevated left atrial pressure.    Colon polyp    Diverticular stricture (HCC) 2009   Diverticulosis 10/30/2007   DM (diabetes mellitus), type 2 (HCC)    Edema 09/12/2013   Fall 05/18/2017   tripped over wheel of walker; superior and inferior pubic ramus fractures    Hypercholesterolemia    Hypertension    Hypertension, essential    Hypothyroidism    as a teenager   Indigestion    Insomnia    OA (osteoarthritis) of hip    right hip replacement, OA in bilateral knees  Osteopenia    Actonel stopped in 2012 after 5years therapy   Pneumonia 1939   "hospitalized"   Rapid resting heart rate 12/25/2012   Uncertain cause. Possible  ectopic atrial tachycardia. Date entered 12/25/12    Right rotator cuff tear    Seasonal allergies    Unsteadiness on feet    Past Surgical History:  Procedure Laterality Date   CATARACT EXTRACTION W/ INTRAOCULAR LENS  IMPLANT, BILATERAL Bilateral    COLONOSCOPY  2009   Dr. Melchor Amour   DILATION AND CURETTAGE OF UTERUS     FRACTURE SURGERY     JOINT REPLACEMENT     ORIF WRIST FRACTURE Right    REDUCTION MAMMAPLASTY  1999   TOTAL HIP ARTHROPLASTY Right 03-04-08   Dr. Lequita Halt    Allergies  Allergen Reactions   Trimethoprim     Per matrix   Amlodipine Besylate Other (See Comments)    Edema   Lisinopril Cough    Cough    Sulfamethoxazole-Trimethoprim Other (See Comments)    Unknown reaction     Outpatient Encounter Medications as of 02/24/2023  Medication Sig   acetaminophen (TYLENOL) 500 MG tablet Take 500 mg by mouth every morning.   albuterol (VENTOLIN HFA) 108 (90 Base) MCG/ACT inhaler Inhale 2 puffs into the lungs every 6 (six) hours as needed for wheezing or shortness of breath.   apixaban (ELIQUIS) 5 MG TABS tablet Take 2 tablets (10 mg total) by mouth 2 (two) times daily for 5 days, THEN 1 tablet (5 mg total) 2 (two) times daily.   atorvastatin (LIPITOR) 40 MG tablet TAKE 1 TABLET ONCE DAILY.   Cholecalciferol 1.25 MG (50000 UT) capsule Take 50,000 Units by mouth every Friday.   diclofenac Sodium (VOLTAREN) 1 % GEL Apply 2 g topically 4 (four) times daily as needed (arthritis pain).   donepezil (ARICEPT) 10 MG tablet TAKE ONE TABLET BY MOUTH AT BEDTIME   famotidine (PEPCID) 20 MG tablet Take 20 mg by mouth every morning.   fexofenadine (ALLEGRA) 180 MG tablet Take 1 tablet (180 mg total) by mouth daily.   Fluticasone-Umeclidin-Vilant (TRELEGY ELLIPTA) 200-62.5-25 MCG/ACT AEPB Inhale 1 puff into the lungs daily.   furosemide (LASIX) 40 MG tablet Take 1 tablet (40 mg total) by mouth every Monday, Wednesday, and Friday.   guaiFENesin (MUCINEX) 600 MG 12 hr tablet Take 1  tablet (600 mg total) by mouth 2 (two) times daily.   memantine (NAMENDA) 10 MG tablet TAKE 1 TABLET BY MOUTH TWICE DAILY.   metoprolol succinate (TOPROL-XL) 50 MG 24 hr tablet Take 1 tablet (50 mg total) by mouth daily. Take with or immediately following a meal.   oxymetazoline (AFRIN NASAL SPRAY) 0.05 % nasal spray Place 2 sprays into both nostrils daily as needed for congestion. Prn for epistaxis   polyethylene glycol (MIRALAX / GLYCOLAX) 17 g packet Take 17 g by mouth daily.   potassium chloride SA (KLOR-CON M) 20 MEQ tablet Take 1 tablet (20 mEq total) by mouth every Monday, Wednesday, and Friday for 1 dose.   saline (AYR) GEL Place 1 Application into both nostrils 2 (two) times daily.   sodium chloride (OCEAN) 0.65 % SOLN nasal spray Place 1 spray into both nostrils as needed for congestion.   vitamin B-12 (CYANOCOBALAMIN) 1000 MCG tablet Take 1 tablet (1,000 mcg total) by mouth daily.   No facility-administered encounter medications on file as of 02/24/2023.    Review of Systems  Constitutional:  Negative for activity change, appetite change, chills, diaphoresis,  fatigue, fever and unexpected weight change.  HENT:  Positive for congestion. Negative for nosebleeds, postnasal drip and trouble swallowing.   Respiratory:  Positive for cough. Negative for shortness of breath and wheezing.   Cardiovascular:  Positive for leg swelling. Negative for chest pain and palpitations.  Gastrointestinal:  Negative for abdominal distention, abdominal pain, constipation and diarrhea.  Genitourinary:  Negative for difficulty urinating and dysuria.  Musculoskeletal:  Positive for gait problem. Negative for arthralgias, back pain, joint swelling and myalgias.  Neurological:  Negative for dizziness, tremors, seizures, syncope, facial asymmetry, speech difficulty, weakness, light-headedness, numbness and headaches.  Psychiatric/Behavioral:  Negative for agitation, behavioral problems and confusion.      Immunization History  Administered Date(s) Administered   Fluad Quad(high Dose 65+) 10/13/2021   Fluad Trivalent(High Dose 65+) 10/14/2022   Influenza, High Dose Seasonal PF 10/20/2018, 11/09/2019   Influenza,inj,Quad PF,6+ Mos 11/04/2017   Influenza-Unspecified 10/25/2013, 10/31/2014, 11/06/2015, 11/01/2016, 10/31/2020   Moderna Covid-19 Vaccine Bivalent Booster 56yrs & up 10/24/2020   Moderna SARS-COV2 Booster Vaccination 11/27/2019   Moderna Sars-Covid-2 Vaccination 01/23/2019, 02/20/2019, 06/15/2021, 11/11/2021   Pneumococcal Conjugate-13 06/04/2014   Pneumococcal Polysaccharide-23 01/12/2000   Tdap 07/19/2017   Unspecified SARS-COV-2 Vaccination 10/14/2022   Zoster Recombinant(Shingrix) 05/21/2021, 09/16/2021   Pertinent  Health Maintenance Due  Topic Date Due   OPHTHALMOLOGY EXAM  07/11/2020   FOOT EXAM  05/04/2023   HEMOGLOBIN A1C  07/18/2023   INFLUENZA VACCINE  Completed   DEXA SCAN  Completed      12/29/2021    9:22 AM 05/04/2022   10:24 AM 05/14/2022   12:59 PM 08/31/2022   10:42 AM 01/25/2023    3:51 PM  Fall Risk  Falls in the past year? 0 0 0 0 1  Was there an injury with Fall? 0 0 0 0 0  Fall Risk Category Calculator 0 0 0 0 1  Fall Risk Category (Retired) Low      (RETIRED) Patient Fall Risk Level Low fall risk      Patient at Risk for Falls Due to No Fall Risks Impaired balance/gait;Impaired mobility Impaired balance/gait;Impaired mobility No Fall Risks History of fall(s);Impaired balance/gait  Fall risk Follow up Falls evaluation completed Falls evaluation completed  Falls evaluation completed Falls evaluation completed;Education provided   Functional Status Survey:    Vitals:   02/24/23 1046  BP: (!) 162/82  Pulse: 78  Resp: 20  Temp: 97.9 F (36.6 C)  SpO2: 97%  Weight: 161 lb 3.2 oz (73.1 kg)   Body mass index is 24.51 kg/m. Physical Exam Vitals and nursing note reviewed.  Constitutional:      General: She is not in acute distress.     Appearance: She is not diaphoretic.  HENT:     Head: Normocephalic and atraumatic.     Nose: No congestion.     Mouth/Throat:     Mouth: Mucous membranes are moist.     Pharynx: Oropharynx is clear.  Neck:     Vascular: No JVD.  Cardiovascular:     Rate and Rhythm: Normal rate and regular rhythm.     Heart sounds: No murmur heard. Pulmonary:     Effort: Pulmonary effort is normal. No respiratory distress.     Breath sounds: Rales (faint RLL) present. No wheezing.  Abdominal:     General: Bowel sounds are normal. There is no distension.     Palpations: Abdomen is soft.     Tenderness: There is no abdominal tenderness.  Musculoskeletal:  Cervical back: No rigidity or tenderness.  Lymphadenopathy:     Cervical: No cervical adenopathy.  Skin:    General: Skin is warm and dry.  Neurological:     General: No focal deficit present.     Mental Status: She is alert. Mental status is at baseline.     Labs reviewed: Recent Labs    01/20/23 0843 01/21/23 0259 02/06/23 2351 02/08/23 0902 02/09/23 0627 02/15/23 0000  NA  --    < > 137 138 136 139  K  --    < > 4.1 4.0 4.1 4.0  CL  --    < > 105 106 105 107  CO2  --    < > 19* 23 19* 21  GLUCOSE  --    < > 151* 155* 191*  --   BUN  --    < > 55* 51* 49* 27*  CREATININE  --    < > 1.56* 1.48* 1.37* 1.1  CALCIUM  --    < > 8.5* 8.6* 8.7* 8.5*  MG 2.1  --   --  2.4  --   --    < > = values in this interval not displayed.   Recent Labs    01/18/23 0000 01/20/23 0154 02/08/23 0902  AST 22 19 17   ALT 16 15 24   ALKPHOS 108 82 88  BILITOT  --  1.5* 0.7  PROT  --  6.8 6.1*  ALBUMIN 3.8 3.4* 2.9*   Recent Labs    01/20/23 0154 01/20/23 0526 02/06/23 2351 02/08/23 0902 02/09/23 0627  WBC 13.6*   < > 13.4* 10.9* 10.6*  NEUTROABS 11.2*  --   --  7.8*  --   HGB 15.6*   < > 14.2 14.2 13.5  HCT 48.7*   < > 46.2* 44.5 42.4  MCV 95.9   < > 98.3 94.3 94.2  PLT 128*   < > 164 158 177   < > = values in this interval not  displayed.   Lab Results  Component Value Date   TSH 1.29 01/18/2023   Lab Results  Component Value Date   HGBA1C 6.8 01/18/2023   Lab Results  Component Value Date   CHOL 146 01/18/2023   HDL 56 01/18/2023   LDLCALC 65 01/18/2023   TRIG 126 01/18/2023    Significant Diagnostic Results in last 30 days:  VAS [place] LOWER EXTREMITY VENOUS (DVT) (ONLY [facility] & [facility]) Result Date: 02/07/2023  Lower Venous DVT Study Patient Name:  Kelsey Salinas  Date of Exam:   02/07/2023 Medical Rec #: [id number]        Accession #:    [id number] Date of Birth: May 22, 1926        Patient Gender: F Patient Age:   84 years Exam Location:  [facility] Procedure:      VAS [place] LOWER EXTREMITY VENOUS (DVT) Referring Phys: [facility] SPONSELLER --------------------------------------------------------------------------------  Indications: Swelling, and Edema.  Risk Factors: Immobility. Limitations: Patient positioning and poor ultrasound/tissue interface. Comparison Study: Novel DVT seen since previous exam 12/10/20. Performing Technologist: Shona Simpson  Examination Guidelines: A complete evaluation includes B-mode imaging, spectral Doppler, color Doppler, and power Doppler as needed of all accessible portions of each vessel. Bilateral testing is considered an integral part of a complete examination. Limited examinations for reoccurring indications may be performed as noted. The reflux portion of the exam is performed with the patient in reverse Trendelenburg.  +---------+---------------+---------+-----------+----------+-------------------+ RIGHT    CompressibilityPhasicitySpontaneityPropertiesThrombus Aging      +---------+---------------+---------+-----------+----------+-------------------+  CFV      Full           Yes      Yes                                      +---------+---------------+---------+-----------+----------+-------------------+ SFJ      Full                                                              +---------+---------------+---------+-----------+----------+-------------------+ FV Prox  Full                                                             +---------+---------------+---------+-----------+----------+-------------------+ FV Mid   Full                                                             +---------+---------------+---------+-----------+----------+-------------------+ FV DistalFull                    Yes                                      +---------+---------------+---------+-----------+----------+-------------------+ PFV      Full                                                             +---------+---------------+---------+-----------+----------+-------------------+ POP      Full           Yes      Yes                                      +---------+---------------+---------+-----------+----------+-------------------+ PTV      Full                                         Not well visualized +---------+---------------+---------+-----------+----------+-------------------+ PERO                                                  Not Visualized      +---------+---------------+---------+-----------+----------+-------------------+   +---------+---------------+---------+-----------+---------------+--------------+ LEFT     CompressibilityPhasicitySpontaneityProperties     Thrombus Aging +---------+---------------+---------+-----------+---------------+--------------+ CFV      None           No  No         brightly       Acute                                                      echogenic                     +---------+---------------+---------+-----------+---------------+--------------+ SFJ      None           No       No         brightly       Acute                                                      echogenic                      +---------+---------------+---------+-----------+---------------+--------------+ FV Prox  None           No       No         brightly       Acute                                                      echogenic                     +---------+---------------+---------+-----------+---------------+--------------+ FV Mid   None           No       No         dilated        Acute          +---------+---------------+---------+-----------+---------------+--------------+ FV DistalNone           No       No         dilated        Acute          +---------+---------------+---------+-----------+---------------+--------------+ PFV      None           No       No         brightly       Acute                                                      echogenic                     +---------+---------------+---------+-----------+---------------+--------------+ POP      Partial        Yes      Yes        dilated        Acute          +---------+---------------+---------+-----------+---------------+--------------+ PTV      None  No                        Age                                                                       Indeterminate  +---------+---------------+---------+-----------+---------------+--------------+ PERO                                                       Not Visualized +---------+---------------+---------+-----------+---------------+--------------+ GSV      Partial                 Yes                                      +---------+---------------+---------+-----------+---------------+--------------+     Summary: RIGHT: - There is no evidence of deep vein thrombosis in the lower extremity.  - No cystic structure found in the popliteal fossa.  LEFT: - Findings consistent with acute deep vein thrombosis involving the left common femoral vein, SF junction, left femoral vein, left proximal profunda vein, and left popliteal  vein. - Findings consistent with acute superficial vein thrombosis involving the left great saphenous vein.  - Findings consistent with age indeterminate deep vein thrombosis involving the left posterior tibial veins.  - No cystic structure found in the popliteal fossa.  *See table(s) above for measurements and observations. Electronically signed by Gerarda Fraction on 02/07/2023 at 5:02:09 PM.    Final     Assessment/Plan  Influenza A Positive test on 02/23/2023. No fever, body aches, or GI symptoms. Eating and drinking well. On Tamiflu for 5 days. -Continue Tamiflu to complete 5-day course.  Chronic cough (worsened) Likely secondary to underlying interstitial lung disease and recent influenza A infection. No wheezing, shortness of breath, significant sputum production, or fever. Guaifenesin and PRN albuterol not effective. -Start Xopenex BID and Q6 hours, PRN. -add humidier -If respiratory status worsens, obtain chest x-ray.  Interstitial Lung Disease Chronic condition. Currently on Trelegy. -Continue Trelegy.  Acute DVT Diagnosed in January 2025 in the left common femoral vein. On Eliquis and compression stockings per vascular surgery recommendation. -Continue Eliquis and compression stockings.  HTN  BP slightly elevated Refused recheck for this visit.  Off losartan due to prior AKI issues during her hospitalization BP was well controlled during her most recent office visit with cardiology If not improving considering adding back low dose of losartan   Family/ staff Communication: nurse  Labs/tests ordered:  NA

## 2023-02-25 ENCOUNTER — Non-Acute Institutional Stay (SKILLED_NURSING_FACILITY): Payer: Medicare Other | Admitting: Adult Health

## 2023-02-25 ENCOUNTER — Encounter: Payer: Self-pay | Admitting: Adult Health

## 2023-02-25 ENCOUNTER — Ambulatory Visit: Payer: Medicare Other | Admitting: Pulmonary Disease

## 2023-02-25 DIAGNOSIS — N1832 Chronic kidney disease, stage 3b: Secondary | ICD-10-CM | POA: Diagnosis not present

## 2023-02-25 DIAGNOSIS — E1122 Type 2 diabetes mellitus with diabetic chronic kidney disease: Secondary | ICD-10-CM | POA: Diagnosis not present

## 2023-02-25 DIAGNOSIS — L89152 Pressure ulcer of sacral region, stage 2: Secondary | ICD-10-CM

## 2023-02-25 DIAGNOSIS — R531 Weakness: Secondary | ICD-10-CM | POA: Diagnosis not present

## 2023-02-25 DIAGNOSIS — R2689 Other abnormalities of gait and mobility: Secondary | ICD-10-CM | POA: Diagnosis not present

## 2023-02-25 DIAGNOSIS — M62561 Muscle wasting and atrophy, not elsewhere classified, right lower leg: Secondary | ICD-10-CM | POA: Diagnosis not present

## 2023-02-25 DIAGNOSIS — M62562 Muscle wasting and atrophy, not elsewhere classified, left lower leg: Secondary | ICD-10-CM | POA: Diagnosis not present

## 2023-02-25 DIAGNOSIS — R278 Other lack of coordination: Secondary | ICD-10-CM | POA: Diagnosis not present

## 2023-02-25 DIAGNOSIS — J9601 Acute respiratory failure with hypoxia: Secondary | ICD-10-CM | POA: Diagnosis not present

## 2023-02-25 NOTE — Progress Notes (Signed)
Location:  Medical illustrator of Service:  SNF (31) Provider:   Peggye Ley, ANP [facility] [phone]   Kelsey Gammon, Kelsey Salinas  Patient Care Team: Kelsey Gammon, Kelsey Salinas as PCP - General (Internal Medicine) Jodelle Red, Kelsey Salinas as PCP - Cardiology (Cardiology) Ollen Gross, Kelsey Salinas as Consulting Physician (Orthopedic Surgery) Lyn Records, Kelsey Salinas (Inactive) as Consulting Physician (Cardiology) Mateo Flow, Kelsey Salinas as Consulting Physician (Ophthalmology) Salvatore Marvel, Kelsey Salinas as Consulting Physician (Orthopedic Surgery)  Extended Emergency Contact Information Primary Emergency Contact: Ludwig [place] Home Phone: [phone] Mobile Phone: [phone] Relation: Daughter Secondary Emergency Contact: [facility] Home Phone: [phone] Mobile Phone: [phone] Relation: Niece  Code Status:  Full Goals of care: Advanced Directive information    02/21/2023    9:35 AM  Advanced Directives  Does Patient Have a Medical Advance Directive? No  Would patient like information on creating a medical advance directive? No - Patient declined     Chief Complaint  Patient presents with   Acute Visit    Wound check    HPI:  Pt is a 89 y.o. female seen today for an acute visit for a wound check   The patient is a 89 year old female with a stage two pressure injury who presents with worsening wound condition.  She resides in a skilled rehab facility and has a stage two pressure ulcer on her buttocks. The wound is not improving, with drainage and increasing redness. Current treatment includes an Allevyn dressing and Aquacel, changed on bath days. She was recently started on Prostat to aid in nutrition and wound healing. Despite using an air mattress, she is not turning regularly when encouraged, which may be impacting her wound healing.  She is currently diagnosed with influenza, which may affect her overall health and  recovery from the pressure ulcer.  Her past medical history includes type two diabetes, which is diet controlled. Her A1c was 6.8 as of January 18, 2023. Past Medical History:  Diagnosis Date   Abscess of right leg 12/24/2020   Childhood asthma    Chronic diastolic heart failure (HCC) 12/25/2012   October 2013 echocardiogram 1. There is moderate concentric left ventricle hypertrophy. 2. Left ventricular ejection fraction estimated by 2D at 60-65 percent. 3. There were no regional wall motion abnormalities. 4. Mild mitral annular calcification. 5. Mild mitral valve regurgitation. 6. There is mild tricuspid regurgitation. 7. Mildly elevated estimated right ventricular systolic pressure. 8. Right ventricular systolic pressure estimated at 35-40 mm Hg. 9. Mild calcification of the aortic valve. 10. Trace aortic valve regurgitation. 11. Analysis of mitral valve inflow, pulmonary vein Doppler and tissue Doppler suggests grade I diastolic dysfunction without elevated left atrial pressure.    Colon polyp    Diverticular stricture (HCC) 2009   Diverticulosis 10/30/2007   DM (diabetes mellitus), type 2 (HCC)    Edema 09/12/2013   Fall 05/18/2017   tripped over wheel of walker; superior and inferior pubic ramus fractures    Hypercholesterolemia    Hypertension    Hypertension, essential    Hypothyroidism    as a teenager   Indigestion    Insomnia    OA (osteoarthritis) of hip    right hip replacement, OA in bilateral knees   Osteopenia    Actonel stopped in 2012 after 5years therapy   Pneumonia 1939   "hospitalized"   Rapid resting heart rate 12/25/2012   Uncertain cause. Possible ectopic atrial tachycardia. Date entered 12/25/12  Right rotator cuff tear    Seasonal allergies    Unsteadiness on feet    Past Surgical History:  Procedure Laterality Date   CATARACT EXTRACTION W/ INTRAOCULAR LENS  IMPLANT, BILATERAL Bilateral    COLONOSCOPY  2009   Dr. Melchor Amour   DILATION AND CURETTAGE OF  UTERUS     FRACTURE SURGERY     JOINT REPLACEMENT     ORIF WRIST FRACTURE Right    REDUCTION MAMMAPLASTY  1999   TOTAL HIP ARTHROPLASTY Right 03-04-08   Dr. Lequita Halt    Allergies  Allergen Reactions   Trimethoprim     Per matrix   Amlodipine Besylate Other (See Comments)    Edema   Lisinopril Cough    Cough    Sulfamethoxazole-Trimethoprim Other (See Comments)    Unknown reaction     Outpatient Encounter Medications as of 02/25/2023  Medication Sig   acetaminophen (TYLENOL) 500 MG tablet Take 500 mg by mouth every morning.   albuterol (VENTOLIN HFA) 108 (90 Base) MCG/ACT inhaler Inhale 2 puffs into the lungs every 6 (six) hours as needed for wheezing or shortness of breath.   apixaban (ELIQUIS) 5 MG TABS tablet Take 2 tablets (10 mg total) by mouth 2 (two) times daily for 5 days, THEN 1 tablet (5 mg total) 2 (two) times daily.   atorvastatin (LIPITOR) 40 MG tablet TAKE 1 TABLET ONCE DAILY.   Cholecalciferol 1.25 MG (50000 UT) capsule Take 50,000 Units by mouth every Friday.   diclofenac Sodium (VOLTAREN) 1 % GEL Apply 2 g topically 4 (four) times daily as needed (arthritis pain).   donepezil (ARICEPT) 10 MG tablet TAKE ONE TABLET BY MOUTH AT BEDTIME   famotidine (PEPCID) 20 MG tablet Take 20 mg by mouth every morning.   fexofenadine (ALLEGRA) 180 MG tablet Take 1 tablet (180 mg total) by mouth daily.   Fluticasone-Umeclidin-Vilant (TRELEGY ELLIPTA) 200-62.5-25 MCG/ACT AEPB Inhale 1 puff into the lungs daily.   furosemide (LASIX) 40 MG tablet Take 1 tablet (40 mg total) by mouth every Monday, Wednesday, and Friday.   guaiFENesin (MUCINEX) 600 MG 12 hr tablet Take 1 tablet (600 mg total) by mouth 2 (two) times daily.   levalbuterol (XOPENEX) 0.31 MG/3ML nebulizer solution Take 3 mLs (0.31 mg total) by nebulization 2 (two) times daily for 5 days. And q 6 prn   memantine (NAMENDA) 10 MG tablet TAKE 1 TABLET BY MOUTH TWICE DAILY.   metoprolol succinate (TOPROL-XL) 50 MG 24 hr tablet  Take 1 tablet (50 mg total) by mouth daily. Take with or immediately following a meal.   oxymetazoline (AFRIN NASAL SPRAY) 0.05 % nasal spray Place 2 sprays into both nostrils daily as needed for congestion. Prn for epistaxis   polyethylene glycol (MIRALAX / GLYCOLAX) 17 g packet Take 17 g by mouth daily.   potassium chloride SA (KLOR-CON M) 20 MEQ tablet Take 1 tablet (20 mEq total) by mouth every Monday, Wednesday, and Friday for 1 dose.   saline (AYR) GEL Place 1 Application into both nostrils 2 (two) times daily.   sodium chloride (OCEAN) 0.65 % SOLN nasal spray Place 1 spray into both nostrils as needed for congestion.   vitamin B-12 (CYANOCOBALAMIN) 1000 MCG tablet Take 1 tablet (1,000 mcg total) by mouth daily.   No facility-administered encounter medications on file as of 02/25/2023.    Review of Systems  Constitutional:  Positive for activity change. Negative for appetite change, chills, diaphoresis, fatigue, fever and unexpected weight change.  HENT:  Negative  for congestion.   Respiratory:  Negative for cough, shortness of breath and wheezing.   Cardiovascular:  Positive for leg swelling. Negative for chest pain and palpitations.  Gastrointestinal:  Negative for abdominal distention, abdominal pain, constipation and diarrhea.  Genitourinary:  Negative for difficulty urinating and dysuria.  Musculoskeletal:  Positive for gait problem. Negative for arthralgias, back pain, joint swelling and myalgias.  Neurological:  Negative for dizziness, tremors, seizures, syncope, facial asymmetry, speech difficulty, weakness, light-headedness, numbness and headaches.  Psychiatric/Behavioral:  Negative for agitation, behavioral problems and confusion.     Immunization History  Administered Date(s) Administered   Fluad Quad(high Dose 65+) 10/13/2021   Fluad Trivalent(High Dose 65+) 10/14/2022   Influenza, High Dose Seasonal PF 10/20/2018, 11/09/2019   Influenza,inj,Quad PF,6+ Mos 11/04/2017    Influenza-Unspecified 10/25/2013, 10/31/2014, 11/06/2015, 11/01/2016, 10/31/2020   Moderna Covid-19 Vaccine Bivalent Booster 29yrs & up 10/24/2020   Moderna SARS-COV2 Booster Vaccination 11/27/2019   Moderna Sars-Covid-2 Vaccination 01/23/2019, 02/20/2019, 06/15/2021, 11/11/2021   Pneumococcal Conjugate-13 06/04/2014   Pneumococcal Polysaccharide-23 01/12/2000   Tdap 07/19/2017   Unspecified SARS-COV-2 Vaccination 10/14/2022   Zoster Recombinant(Shingrix) 05/21/2021, 09/16/2021   Pertinent  Health Maintenance Due  Topic Date Due   OPHTHALMOLOGY EXAM  07/11/2020   FOOT EXAM  05/04/2023   HEMOGLOBIN A1C  07/18/2023   INFLUENZA VACCINE  Completed   DEXA SCAN  Completed      12/29/2021    9:22 AM 05/04/2022   10:24 AM 05/14/2022   12:59 PM 08/31/2022   10:42 AM 01/25/2023    3:51 PM  Fall Risk  Falls in the past year? 0 0 0 0 1  Was there an injury with Fall? 0 0 0 0 0  Fall Risk Category Calculator 0 0 0 0 1  Fall Risk Category (Retired) Low      (RETIRED) Patient Fall Risk Level Low fall risk      Patient at Risk for Falls Due to No Fall Risks Impaired balance/gait;Impaired mobility Impaired balance/gait;Impaired mobility No Fall Risks History of fall(s);Impaired balance/gait  Fall risk Follow up Falls evaluation completed Falls evaluation completed  Falls evaluation completed Falls evaluation completed;Education provided   Functional Status Survey:    Vitals:   02/25/23 1157  BP: (!) 168/89  Pulse: 72  Resp: 14  Temp: (!) 97.5 F (36.4 C)  Weight: 161 lb 3.2 oz (73.1 kg)   Body mass index is 24.51 kg/m. Physical Exam Vitals reviewed.  Constitutional:      Appearance: Normal appearance.  Musculoskeletal:     Comments: Bilateral buttocks with erythema. Stage 2 pressure injury to sacrum. 50% yellow tissue and 50% erythema. Clear yellow drainage noted.   Skin:    General: Skin is warm and dry.  Neurological:     General: No focal deficit present.     Mental Status: She  is alert. Mental status is at baseline.     Labs reviewed: Recent Labs    01/20/23 0843 01/21/23 0259 02/06/23 2351 02/08/23 0902 02/09/23 0627 02/15/23 0000  NA  --    < > 137 138 136 139  K  --    < > 4.1 4.0 4.1 4.0  CL  --    < > 105 106 105 107  CO2  --    < > 19* 23 19* 21  GLUCOSE  --    < > 151* 155* 191*  --   BUN  --    < > 55* 51* 49* 27*  CREATININE  --    < >  1.56* 1.48* 1.37* 1.1  CALCIUM  --    < > 8.5* 8.6* 8.7* 8.5*  MG 2.1  --   --  2.4  --   --    < > = values in this interval not displayed.   Recent Labs    01/18/23 0000 01/20/23 0154 02/08/23 0902  AST 22 19 17   ALT 16 15 24   ALKPHOS 108 82 88  BILITOT  --  1.5* 0.7  PROT  --  6.8 6.1*  ALBUMIN 3.8 3.4* 2.9*   Recent Labs    01/20/23 0154 01/20/23 0526 02/06/23 2351 02/08/23 0902 02/09/23 0627  WBC 13.6*   < > 13.4* 10.9* 10.6*  NEUTROABS 11.2*  --   --  7.8*  --   HGB 15.6*   < > 14.2 14.2 13.5  HCT 48.7*   < > 46.2* 44.5 42.4  MCV 95.9   < > 98.3 94.3 94.2  PLT 128*   < > 164 158 177   < > = values in this interval not displayed.   Lab Results  Component Value Date   TSH 1.29 01/18/2023   Lab Results  Component Value Date   HGBA1C 6.8 01/18/2023   Lab Results  Component Value Date   CHOL 146 01/18/2023   HDL 56 01/18/2023   LDLCALC 65 01/18/2023   TRIG 126 01/18/2023    Significant Diagnostic Results in last 30 days:  VAS [place] LOWER EXTREMITY VENOUS (DVT) (ONLY [facility] & [facility]) Result Date: 02/07/2023  Lower Venous DVT Study Patient Name:  Kelsey Salinas  Date of Exam:   02/07/2023 Medical Rec #: [id number]        Accession #:    [id number] Date of Birth: 06-Dec-1926        Patient Gender: F Patient Age:   89 years Exam Location[facility]  [facility] Procedure:      VAS [place] LOWER EXTREMITY VENOUS (DVT) Referring Phys: [facility] SPONSELLER --------------------------------------------------------------------------------  Indications: Swelling, and Edema.  Risk Factors: Immobility. Limitations:  Patient positioning and poor ultrasound/tissue interface. Comparison Study: Novel DVT seen since previous exam 12/10/20. Performing Technologist: Shona Simpson  Examination Guidelines: A complete evaluation includes B-mode imaging, spectral Doppler, color Doppler, and power Doppler as needed of all accessible portions of each vessel. Bilateral testing is considered an integral part of a complete examination. Limited examinations for reoccurring indications may be performed as noted. The reflux portion of the exam is performed with the patient in reverse Trendelenburg.  +---------+---------------+---------+-----------+----------+-------------------+ RIGHT    CompressibilityPhasicitySpontaneityPropertiesThrombus Aging      +---------+---------------+---------+-----------+----------+-------------------+ CFV      Full           Yes      Yes                                      +---------+---------------+---------+-----------+----------+-------------------+ SFJ      Full                                                             +---------+---------------+---------+-----------+----------+-------------------+ FV Prox  Full                                                             +---------+---------------+---------+-----------+----------+-------------------+  FV Mid   Full                                                             +---------+---------------+---------+-----------+----------+-------------------+ FV DistalFull                    Yes                                      +---------+---------------+---------+-----------+----------+-------------------+ PFV      Full                                                             +---------+---------------+---------+-----------+----------+-------------------+ POP      Full           Yes      Yes                                       +---------+---------------+---------+-----------+----------+-------------------+ PTV      Full                                         Not well visualized +---------+---------------+---------+-----------+----------+-------------------+ PERO                                                  Not Visualized      +---------+---------------+---------+-----------+----------+-------------------+   +---------+---------------+---------+-----------+---------------+--------------+ LEFT     CompressibilityPhasicitySpontaneityProperties     Thrombus Aging +---------+---------------+---------+-----------+---------------+--------------+ CFV      None           No       No         brightly       Acute                                                      echogenic                     +---------+---------------+---------+-----------+---------------+--------------+ SFJ      None           No       No         brightly       Acute                                                      echogenic                     +---------+---------------+---------+-----------+---------------+--------------+  FV Prox  None           No       No         brightly       Acute                                                      echogenic                     +---------+---------------+---------+-----------+---------------+--------------+ FV Mid   None           No       No         dilated        Acute          +---------+---------------+---------+-----------+---------------+--------------+ FV DistalNone           No       No         dilated        Acute          +---------+---------------+---------+-----------+---------------+--------------+ PFV      None           No       No         brightly       Acute                                                      echogenic                     +---------+---------------+---------+-----------+---------------+--------------+  POP      Partial        Yes      Yes        dilated        Acute          +---------+---------------+---------+-----------+---------------+--------------+ PTV      None                    No                        Age                                                                       Indeterminate  +---------+---------------+---------+-----------+---------------+--------------+ PERO                                                       Not Visualized +---------+---------------+---------+-----------+---------------+--------------+ GSV      Partial                 Yes                                      +---------+---------------+---------+-----------+---------------+--------------+  Summary: RIGHT: - There is no evidence of deep vein thrombosis in the lower extremity.  - No cystic structure found in the popliteal fossa.  LEFT: - Findings consistent with acute deep vein thrombosis involving the left common femoral vein, SF junction, left femoral vein, left proximal profunda vein, and left popliteal vein. - Findings consistent with acute superficial vein thrombosis involving the left great saphenous vein.  - Findings consistent with age indeterminate deep vein thrombosis involving the left posterior tibial veins.  - No cystic structure found in the popliteal fossa.  *See table(s) above for measurements and observations. Electronically signed by Gerarda Fraction on 02/07/2023 at 5:02:09 PM.    Final     Assessment and Plan  Stage II Pressure Injury Sacral wound with increased redness and drainage, causing maceration. Currently managed with Aquacel AG and Allevyn dressing, changed on bath days. Patient is not turning regularly despite encouragement. -Continue current  dressing, but increase frequency of changes to every other day due to increased drainage. -Add Santyl to dressing changes to aid in slough tissue removal. -If no improvement, consider antibiotic  therapy. -Encourage turning every 2 hours.  Influenza Active infection on [place] -Continue current management.  Type II Diabetes Well-controlled with diet, A1c 6.8 on 01/18/2023. -Continue diet control.  Nutrition and Wound Healing Recently started on Periostat. -Continue Prostat daily.

## 2023-02-28 DIAGNOSIS — R531 Weakness: Secondary | ICD-10-CM | POA: Diagnosis not present

## 2023-02-28 DIAGNOSIS — R278 Other lack of coordination: Secondary | ICD-10-CM | POA: Diagnosis not present

## 2023-02-28 DIAGNOSIS — R2689 Other abnormalities of gait and mobility: Secondary | ICD-10-CM | POA: Diagnosis not present

## 2023-02-28 DIAGNOSIS — J9601 Acute respiratory failure with hypoxia: Secondary | ICD-10-CM | POA: Diagnosis not present

## 2023-02-28 DIAGNOSIS — M62561 Muscle wasting and atrophy, not elsewhere classified, right lower leg: Secondary | ICD-10-CM | POA: Diagnosis not present

## 2023-02-28 DIAGNOSIS — M62562 Muscle wasting and atrophy, not elsewhere classified, left lower leg: Secondary | ICD-10-CM | POA: Diagnosis not present

## 2023-03-01 DIAGNOSIS — J9601 Acute respiratory failure with hypoxia: Secondary | ICD-10-CM | POA: Diagnosis not present

## 2023-03-01 DIAGNOSIS — M62562 Muscle wasting and atrophy, not elsewhere classified, left lower leg: Secondary | ICD-10-CM | POA: Diagnosis not present

## 2023-03-01 DIAGNOSIS — M62561 Muscle wasting and atrophy, not elsewhere classified, right lower leg: Secondary | ICD-10-CM | POA: Diagnosis not present

## 2023-03-01 DIAGNOSIS — R531 Weakness: Secondary | ICD-10-CM | POA: Diagnosis not present

## 2023-03-01 DIAGNOSIS — R2689 Other abnormalities of gait and mobility: Secondary | ICD-10-CM | POA: Diagnosis not present

## 2023-03-01 DIAGNOSIS — R278 Other lack of coordination: Secondary | ICD-10-CM | POA: Diagnosis not present

## 2023-03-02 DIAGNOSIS — M62561 Muscle wasting and atrophy, not elsewhere classified, right lower leg: Secondary | ICD-10-CM | POA: Diagnosis not present

## 2023-03-02 DIAGNOSIS — R2689 Other abnormalities of gait and mobility: Secondary | ICD-10-CM | POA: Diagnosis not present

## 2023-03-02 DIAGNOSIS — M62562 Muscle wasting and atrophy, not elsewhere classified, left lower leg: Secondary | ICD-10-CM | POA: Diagnosis not present

## 2023-03-02 DIAGNOSIS — J9601 Acute respiratory failure with hypoxia: Secondary | ICD-10-CM | POA: Diagnosis not present

## 2023-03-02 DIAGNOSIS — R278 Other lack of coordination: Secondary | ICD-10-CM | POA: Diagnosis not present

## 2023-03-02 DIAGNOSIS — R531 Weakness: Secondary | ICD-10-CM | POA: Diagnosis not present

## 2023-03-03 DIAGNOSIS — M62561 Muscle wasting and atrophy, not elsewhere classified, right lower leg: Secondary | ICD-10-CM | POA: Diagnosis not present

## 2023-03-03 DIAGNOSIS — R278 Other lack of coordination: Secondary | ICD-10-CM | POA: Diagnosis not present

## 2023-03-03 DIAGNOSIS — M62562 Muscle wasting and atrophy, not elsewhere classified, left lower leg: Secondary | ICD-10-CM | POA: Diagnosis not present

## 2023-03-03 DIAGNOSIS — J9601 Acute respiratory failure with hypoxia: Secondary | ICD-10-CM | POA: Diagnosis not present

## 2023-03-03 DIAGNOSIS — R2689 Other abnormalities of gait and mobility: Secondary | ICD-10-CM | POA: Diagnosis not present

## 2023-03-03 DIAGNOSIS — R531 Weakness: Secondary | ICD-10-CM | POA: Diagnosis not present

## 2023-03-04 DIAGNOSIS — M62561 Muscle wasting and atrophy, not elsewhere classified, right lower leg: Secondary | ICD-10-CM | POA: Diagnosis not present

## 2023-03-04 DIAGNOSIS — R2689 Other abnormalities of gait and mobility: Secondary | ICD-10-CM | POA: Diagnosis not present

## 2023-03-04 DIAGNOSIS — M62562 Muscle wasting and atrophy, not elsewhere classified, left lower leg: Secondary | ICD-10-CM | POA: Diagnosis not present

## 2023-03-04 DIAGNOSIS — J9601 Acute respiratory failure with hypoxia: Secondary | ICD-10-CM | POA: Diagnosis not present

## 2023-03-04 DIAGNOSIS — R278 Other lack of coordination: Secondary | ICD-10-CM | POA: Diagnosis not present

## 2023-03-04 DIAGNOSIS — R531 Weakness: Secondary | ICD-10-CM | POA: Diagnosis not present

## 2023-03-07 DIAGNOSIS — J9601 Acute respiratory failure with hypoxia: Secondary | ICD-10-CM | POA: Diagnosis not present

## 2023-03-07 DIAGNOSIS — R2689 Other abnormalities of gait and mobility: Secondary | ICD-10-CM | POA: Diagnosis not present

## 2023-03-07 DIAGNOSIS — M62561 Muscle wasting and atrophy, not elsewhere classified, right lower leg: Secondary | ICD-10-CM | POA: Diagnosis not present

## 2023-03-07 DIAGNOSIS — R531 Weakness: Secondary | ICD-10-CM | POA: Diagnosis not present

## 2023-03-07 DIAGNOSIS — M62562 Muscle wasting and atrophy, not elsewhere classified, left lower leg: Secondary | ICD-10-CM | POA: Diagnosis not present

## 2023-03-07 DIAGNOSIS — R278 Other lack of coordination: Secondary | ICD-10-CM | POA: Diagnosis not present

## 2023-03-08 DIAGNOSIS — J9601 Acute respiratory failure with hypoxia: Secondary | ICD-10-CM | POA: Diagnosis not present

## 2023-03-08 DIAGNOSIS — M62561 Muscle wasting and atrophy, not elsewhere classified, right lower leg: Secondary | ICD-10-CM | POA: Diagnosis not present

## 2023-03-08 DIAGNOSIS — M62562 Muscle wasting and atrophy, not elsewhere classified, left lower leg: Secondary | ICD-10-CM | POA: Diagnosis not present

## 2023-03-08 DIAGNOSIS — R2689 Other abnormalities of gait and mobility: Secondary | ICD-10-CM | POA: Diagnosis not present

## 2023-03-08 DIAGNOSIS — R278 Other lack of coordination: Secondary | ICD-10-CM | POA: Diagnosis not present

## 2023-03-08 DIAGNOSIS — R531 Weakness: Secondary | ICD-10-CM | POA: Diagnosis not present

## 2023-03-09 DIAGNOSIS — M62561 Muscle wasting and atrophy, not elsewhere classified, right lower leg: Secondary | ICD-10-CM | POA: Diagnosis not present

## 2023-03-09 DIAGNOSIS — R531 Weakness: Secondary | ICD-10-CM | POA: Diagnosis not present

## 2023-03-09 DIAGNOSIS — R2689 Other abnormalities of gait and mobility: Secondary | ICD-10-CM | POA: Diagnosis not present

## 2023-03-09 DIAGNOSIS — R278 Other lack of coordination: Secondary | ICD-10-CM | POA: Diagnosis not present

## 2023-03-09 DIAGNOSIS — J9601 Acute respiratory failure with hypoxia: Secondary | ICD-10-CM | POA: Diagnosis not present

## 2023-03-09 DIAGNOSIS — M62562 Muscle wasting and atrophy, not elsewhere classified, left lower leg: Secondary | ICD-10-CM | POA: Diagnosis not present

## 2023-03-10 DIAGNOSIS — M62561 Muscle wasting and atrophy, not elsewhere classified, right lower leg: Secondary | ICD-10-CM | POA: Diagnosis not present

## 2023-03-10 DIAGNOSIS — M62562 Muscle wasting and atrophy, not elsewhere classified, left lower leg: Secondary | ICD-10-CM | POA: Diagnosis not present

## 2023-03-10 DIAGNOSIS — R531 Weakness: Secondary | ICD-10-CM | POA: Diagnosis not present

## 2023-03-10 DIAGNOSIS — R278 Other lack of coordination: Secondary | ICD-10-CM | POA: Diagnosis not present

## 2023-03-10 DIAGNOSIS — J9601 Acute respiratory failure with hypoxia: Secondary | ICD-10-CM | POA: Diagnosis not present

## 2023-03-10 DIAGNOSIS — R2689 Other abnormalities of gait and mobility: Secondary | ICD-10-CM | POA: Diagnosis not present

## 2023-03-11 DIAGNOSIS — R2689 Other abnormalities of gait and mobility: Secondary | ICD-10-CM | POA: Diagnosis not present

## 2023-03-11 DIAGNOSIS — R278 Other lack of coordination: Secondary | ICD-10-CM | POA: Diagnosis not present

## 2023-03-11 DIAGNOSIS — R531 Weakness: Secondary | ICD-10-CM | POA: Diagnosis not present

## 2023-03-11 DIAGNOSIS — M62562 Muscle wasting and atrophy, not elsewhere classified, left lower leg: Secondary | ICD-10-CM | POA: Diagnosis not present

## 2023-03-11 DIAGNOSIS — J9601 Acute respiratory failure with hypoxia: Secondary | ICD-10-CM | POA: Diagnosis not present

## 2023-03-11 DIAGNOSIS — M62561 Muscle wasting and atrophy, not elsewhere classified, right lower leg: Secondary | ICD-10-CM | POA: Diagnosis not present

## 2023-03-14 DIAGNOSIS — R2689 Other abnormalities of gait and mobility: Secondary | ICD-10-CM | POA: Diagnosis not present

## 2023-03-14 DIAGNOSIS — R278 Other lack of coordination: Secondary | ICD-10-CM | POA: Diagnosis not present

## 2023-03-14 DIAGNOSIS — I82402 Acute embolism and thrombosis of unspecified deep veins of left lower extremity: Secondary | ICD-10-CM | POA: Diagnosis not present

## 2023-03-14 DIAGNOSIS — R531 Weakness: Secondary | ICD-10-CM | POA: Diagnosis not present

## 2023-03-14 DIAGNOSIS — J9601 Acute respiratory failure with hypoxia: Secondary | ICD-10-CM | POA: Diagnosis not present

## 2023-03-14 DIAGNOSIS — M62561 Muscle wasting and atrophy, not elsewhere classified, right lower leg: Secondary | ICD-10-CM | POA: Diagnosis not present

## 2023-03-14 DIAGNOSIS — M62562 Muscle wasting and atrophy, not elsewhere classified, left lower leg: Secondary | ICD-10-CM | POA: Diagnosis not present

## 2023-03-15 DIAGNOSIS — M62561 Muscle wasting and atrophy, not elsewhere classified, right lower leg: Secondary | ICD-10-CM | POA: Diagnosis not present

## 2023-03-15 DIAGNOSIS — J9601 Acute respiratory failure with hypoxia: Secondary | ICD-10-CM | POA: Diagnosis not present

## 2023-03-15 DIAGNOSIS — R278 Other lack of coordination: Secondary | ICD-10-CM | POA: Diagnosis not present

## 2023-03-15 DIAGNOSIS — R531 Weakness: Secondary | ICD-10-CM | POA: Diagnosis not present

## 2023-03-15 DIAGNOSIS — M62562 Muscle wasting and atrophy, not elsewhere classified, left lower leg: Secondary | ICD-10-CM | POA: Diagnosis not present

## 2023-03-15 DIAGNOSIS — R2689 Other abnormalities of gait and mobility: Secondary | ICD-10-CM | POA: Diagnosis not present

## 2023-03-16 DIAGNOSIS — R531 Weakness: Secondary | ICD-10-CM | POA: Diagnosis not present

## 2023-03-16 DIAGNOSIS — R2689 Other abnormalities of gait and mobility: Secondary | ICD-10-CM | POA: Diagnosis not present

## 2023-03-16 DIAGNOSIS — J9601 Acute respiratory failure with hypoxia: Secondary | ICD-10-CM | POA: Diagnosis not present

## 2023-03-16 DIAGNOSIS — M62562 Muscle wasting and atrophy, not elsewhere classified, left lower leg: Secondary | ICD-10-CM | POA: Diagnosis not present

## 2023-03-16 DIAGNOSIS — R278 Other lack of coordination: Secondary | ICD-10-CM | POA: Diagnosis not present

## 2023-03-16 DIAGNOSIS — M62561 Muscle wasting and atrophy, not elsewhere classified, right lower leg: Secondary | ICD-10-CM | POA: Diagnosis not present

## 2023-03-17 DIAGNOSIS — R531 Weakness: Secondary | ICD-10-CM | POA: Diagnosis not present

## 2023-03-17 DIAGNOSIS — M62562 Muscle wasting and atrophy, not elsewhere classified, left lower leg: Secondary | ICD-10-CM | POA: Diagnosis not present

## 2023-03-17 DIAGNOSIS — M62561 Muscle wasting and atrophy, not elsewhere classified, right lower leg: Secondary | ICD-10-CM | POA: Diagnosis not present

## 2023-03-17 DIAGNOSIS — R278 Other lack of coordination: Secondary | ICD-10-CM | POA: Diagnosis not present

## 2023-03-17 DIAGNOSIS — R2689 Other abnormalities of gait and mobility: Secondary | ICD-10-CM | POA: Diagnosis not present

## 2023-03-17 DIAGNOSIS — J9601 Acute respiratory failure with hypoxia: Secondary | ICD-10-CM | POA: Diagnosis not present

## 2023-03-18 ENCOUNTER — Ambulatory Visit: Payer: Medicare Other | Admitting: Pulmonary Disease

## 2023-03-18 ENCOUNTER — Encounter: Payer: Self-pay | Admitting: Internal Medicine

## 2023-03-18 ENCOUNTER — Encounter: Payer: Self-pay | Admitting: Pulmonary Disease

## 2023-03-18 VITALS — BP 132/74 | HR 93 | Ht 68.0 in | Wt 179.0 lb

## 2023-03-18 DIAGNOSIS — R2689 Other abnormalities of gait and mobility: Secondary | ICD-10-CM | POA: Diagnosis not present

## 2023-03-18 DIAGNOSIS — J438 Other emphysema: Secondary | ICD-10-CM

## 2023-03-18 DIAGNOSIS — J849 Interstitial pulmonary disease, unspecified: Secondary | ICD-10-CM | POA: Diagnosis not present

## 2023-03-18 DIAGNOSIS — Z87891 Personal history of nicotine dependence: Secondary | ICD-10-CM

## 2023-03-18 DIAGNOSIS — Z86718 Personal history of other venous thrombosis and embolism: Secondary | ICD-10-CM

## 2023-03-18 DIAGNOSIS — R278 Other lack of coordination: Secondary | ICD-10-CM | POA: Diagnosis not present

## 2023-03-18 DIAGNOSIS — M62562 Muscle wasting and atrophy, not elsewhere classified, left lower leg: Secondary | ICD-10-CM | POA: Diagnosis not present

## 2023-03-18 DIAGNOSIS — M62561 Muscle wasting and atrophy, not elsewhere classified, right lower leg: Secondary | ICD-10-CM | POA: Diagnosis not present

## 2023-03-18 DIAGNOSIS — R531 Weakness: Secondary | ICD-10-CM | POA: Diagnosis not present

## 2023-03-18 DIAGNOSIS — Z7901 Long term (current) use of anticoagulants: Secondary | ICD-10-CM

## 2023-03-18 DIAGNOSIS — J9601 Acute respiratory failure with hypoxia: Secondary | ICD-10-CM | POA: Diagnosis not present

## 2023-03-18 DIAGNOSIS — R053 Chronic cough: Secondary | ICD-10-CM

## 2023-03-18 NOTE — Telephone Encounter (Signed)
Message routed to PCP Gupta, Anjali L, MD  

## 2023-03-18 NOTE — Progress Notes (Signed)
 Kelsey Salinas    [id number]    [birth year]  Primary Care Physician:Gupta, Freddie Breech, MD  Referring Physician: [name], MD [address] [place],  [place] [address]  Chief complaint: Follow up for emphysema  HPI: 89 y.o. who  has a past medical history of Abscess of right leg (12/24/2020), Childhood asthma, Chronic diastolic heart failure (HCC) (08/65/7846), Colon polyp, Diverticular stricture (HCC) (2009), Diverticulosis (10/30/2007), DM (diabetes mellitus), type 2 (HCC), Edema (09/12/2013), Fall (05/18/2017), Hypercholesterolemia, Hypertension, Hypertension, essential, Hypothyroidism, Indigestion, Insomnia, OA (osteoarthritis) of hip, Osteopenia, Pneumonia (1939), Rapid resting heart rate (12/25/2012), Right rotator cuff tear, Seasonal allergies, and Unsteadiness on feet.   She was hospitalized in early September 2023 with acute on chronic heart failure with preserved ejection fraction.  Treated with IV Lasix but had a mild bump in creatinine.  Initially on 2 L oxygen which was weaned off at the time of discharge.  Cardiology was consulted who felt that there may be a component of lung disease given wheezing on examination and emphysema on CT scan.  She has been referred to pulmonary for further evaluation  She feels well post discharge.  Complains of mild cough with mucus production.  Pets: Used to have a dog Occupation: Retired travel agent Exposures: No mold, hot tub, Jacuzzi.  No feather pillows or comforters Smoking history: 60-pack-year smoker.  Quit in 2010 Travel history: Originally from [place].  No significant recent travel Relevant family history: No family history of lung disease  Interim History: Discussed the use of AI scribe software for clinical note transcription with the patient, who gave verbal consent to proceed.  The patient, with emphysema and pulmonary fibrosis, presents with chronic cough and follow-up for her conditions. She is  accompanied by her son, Onalee Hua, who is on the phone from [facility].  She has a history of emphysema, likely related to past smoking, although she quit smoking many years ago. A CT scan from 2023 showed significant emphysema, and follow-up imaging up to January 2025 indicates that the emphysema is stable. She is currently using Trelegy, an inhaler, which she started using regularly after entering rehabilitation. Prior to this, she did not use the inhaler consistently while living in her apartment.  She also has pulmonary fibrosis, with scarring noted at the base of the lungs. Imaging from 2023 to January 2025 shows that the fibrosis has remained stable. She has not undergone lung function tests recently, and there is no indication of progressive disease.  She experiences a persistent cough that has been ongoing for over a year. Her oxygen levels during the day range between 90 and 93%, and she uses supplemental oxygen at night. Her son reports that she is better than she was following her hospitalization for pneumonia and DVT, but she still experiences a chronic cough. No dyspnea and her oxygen levels remain stable during physical therapy, not dropping below 90%.  She has had 2 admissions in 2025 for right upper lobe pneumonia, DVT which was treated with antibiotics, anticoagulation.   She is on Eliquis for the treatment of a DVT and has experienced slight swelling. Her son, Onalee Hua, is involved in her care and provides information about her symptoms and treatment adherence. She resides at [facility], where she has been for an extended period.   Outpatient Encounter Medications as of 03/18/2023  Medication Sig   acetaminophen (TYLENOL) 500 MG tablet Take 500 mg by mouth every morning.   albuterol (VENTOLIN HFA) 108 (  90 Base) MCG/ACT inhaler Inhale 2 puffs into the lungs every 6 (six) hours as needed for wheezing or shortness of breath.   apixaban (ELIQUIS) 5 MG TABS tablet Take 2 tablets (10 mg total) by  mouth 2 (two) times daily for 5 days, THEN 1 tablet (5 mg total) 2 (two) times daily.   atorvastatin (LIPITOR) 40 MG tablet TAKE 1 TABLET ONCE DAILY.   Cholecalciferol 1.25 MG (50000 UT) capsule Take 50,000 Units by mouth every Friday.   diclofenac Sodium (VOLTAREN) 1 % GEL Apply 2 g topically 4 (four) times daily as needed (arthritis pain).   donepezil (ARICEPT) 10 MG tablet TAKE ONE TABLET BY MOUTH AT BEDTIME   famotidine (PEPCID) 20 MG tablet Take 20 mg by mouth every morning.   fexofenadine (ALLEGRA) 180 MG tablet Take 1 tablet (180 mg total) by mouth daily.   Fluticasone-Umeclidin-Vilant (TRELEGY ELLIPTA) 200-62.5-25 MCG/ACT AEPB Inhale 1 puff into the lungs daily.   furosemide (LASIX) 40 MG tablet Take 1 tablet (40 mg total) by mouth every Monday, Wednesday, and Friday.   guaiFENesin (MUCINEX) 600 MG 12 hr tablet Take 1 tablet (600 mg total) by mouth 2 (two) times daily.   memantine (NAMENDA) 10 MG tablet TAKE 1 TABLET BY MOUTH TWICE DAILY.   metoprolol succinate (TOPROL-XL) 50 MG 24 hr tablet Take 1 tablet (50 mg total) by mouth daily. Take with or immediately following a meal.   oxymetazoline (AFRIN NASAL SPRAY) 0.05 % nasal spray Place 2 sprays into both nostrils daily as needed for congestion. Prn for epistaxis   polyethylene glycol (MIRALAX / GLYCOLAX) 17 g packet Take 17 g by mouth daily.   potassium chloride SA (KLOR-CON M) 20 MEQ tablet Take 1 tablet (20 mEq total) by mouth every Monday, Wednesday, and Friday for 1 dose.   saline (AYR) GEL Place 1 Application into both nostrils 2 (two) times daily.   sodium chloride (OCEAN) 0.65 % SOLN nasal spray Place 1 spray into both nostrils as needed for congestion.   vitamin B-12 (CYANOCOBALAMIN) 1000 MCG tablet Take 1 tablet (1,000 mcg total) by mouth daily.   levalbuterol (XOPENEX) 0.31 MG/3ML nebulizer solution Take 3 mLs (0.31 mg total) by nebulization 2 (two) times daily for 5 days. And q 6 prn   No facility-administered encounter  medications on file as of 03/18/2023.    Physical Exam: Blood pressure 132/74, pulse 93, height 5\' 8"  (1.727 m), weight 179 lb (81.2 kg), SpO2 94%. Gen:      No acute distress HEENT:  EOMI, sclera anicteric Neck:     No masses; no thyromegaly Lungs:    Clear to auscultation bilaterally; normal respiratory effort CV:         Regular rate and rhythm; no murmurs Abd:      + bowel sounds; soft, non-tender; no palpable masses, no distension Ext:    No edema; adequate peripheral perfusion Skin:      Warm and dry; no rash Neuro: alert and oriented x 3 Psych: normal mood and affect   Data Reviewed: Imaging: CT angiogram 09/09/2021-negative for pulmonary embolism, moderate emphysema.  Hazy groundglass opacities in the left lung.  High resolution CT 10/08/2021-findings of interstitial lung disease with basilar predominant scarring, bronchiectasis.  Probable UIP pattern CT chest 01/20/2023-posterior right upper lobe pneumonia, stable fibrosis.  Trace effusions, emphysema I have reviewed the images personally.  PFTs:  Labs: CBC 12/24/2020-WBC 8.4, eos 2%, absolute eosinophil count 168  Assessment:  Emphysema Severe emphysema noted on CT scan, likely  secondary to smoking history. No progression since 2023. Managed with Trelegy inhaler. Condition appears well-managed; no further lung function tests planned due to age and current regimen. - Continue Trelegy inhaler - Monitor daytime oxygen levels - Use nocturnal oxygen  Pulmonary Fibrosis Pulmonary fibrosis with scarring at lung bases. Well-managed since 2023. Conservative management advised due to age and stable condition. Avoiding invasive biopsy and antifibrotic therapy due to potential intolerance. - Monitor condition.  I will follow-up CT in 6 months to ensure clearance of recent pneumonia - Avoid invasive procedures and antifibrotic therapy  Chronic Cough Chronic cough persisting for over a year, likely multifactorial and related to  underlying lung conditions. Oxygen levels stable during physical therapy and daily activities. - Monitor symptoms  Deep Vein Thrombosis (DVT) DVT managed with Eliquis. Slight swelling noted. TED stockings and leg elevation recommended. - Continue Eliquis - Prescribe TED stockings - Advise leg elevation when lying down  General Health Maintenance Discussion about potential use of mirtazapine to help with appetite and other symptoms. No contraindications from a pulmonary perspective. - Discuss mirtazapine with primary care physician  Follow-up - Schedule follow-up visit in six months.   Plan/Recommendations: CT in 6 months Trelegy Supplemental O2 at night  Chilton Greathouse MD [facility] 03/18/2023, 9:19 AM  CC: [name], MD

## 2023-03-18 NOTE — Patient Instructions (Signed)
 VISIT SUMMARY:  During today's visit, we reviewed your chronic cough and the management of your emphysema and pulmonary fibrosis. Your son, Onalee Hua, joined [place] via phone to provide additional information about your symptoms and treatment adherence. We discussed your current medications and made some recommendations to help manage your conditions better.  YOUR PLAN:  -EMPHYSEMA: Emphysema is a lung condition that causes shortness of breath due to damaged air sacs. Your condition has remained stable since 2023, and you should continue using the Trelegy inhaler daily. We will keep monitoring your daytime oxygen levels and continue using supplemental oxygen at night.  -PULMONARY FIBROSIS: Pulmonary fibrosis is a lung disease that occurs when lung tissue becomes damaged and scarred. Your condition has been stable since 2023, and we will continue to monitor it without invasive procedures or antifibrotic therapy due to potential intolerance.  -CHRONIC COUGH: Your chronic cough has been ongoing for over a year and is likely related to your lung conditions. We will continue to monitor your symptoms.  -DEEP VEIN THROMBOSIS (DVT): DVT is a condition where blood clots form in deep veins, usually in the legs. You are currently managing this with Eliquis. We recommend continuing Eliquis, using TED stockings, and elevating your legs when lying down to help with the slight swelling.  -GENERAL HEALTH MAINTENANCE: We discussed the potential use of mirtazapine to help with your appetite and other symptoms. There are no contraindications from a pulmonary perspective, but you should discuss this further with your primary care physician.  INSTRUCTIONS:  Please schedule a follow-up visit in six months.

## 2023-03-19 DIAGNOSIS — J9601 Acute respiratory failure with hypoxia: Secondary | ICD-10-CM | POA: Diagnosis not present

## 2023-03-19 DIAGNOSIS — M62561 Muscle wasting and atrophy, not elsewhere classified, right lower leg: Secondary | ICD-10-CM | POA: Diagnosis not present

## 2023-03-19 DIAGNOSIS — R2689 Other abnormalities of gait and mobility: Secondary | ICD-10-CM | POA: Diagnosis not present

## 2023-03-19 DIAGNOSIS — M62562 Muscle wasting and atrophy, not elsewhere classified, left lower leg: Secondary | ICD-10-CM | POA: Diagnosis not present

## 2023-03-19 DIAGNOSIS — R531 Weakness: Secondary | ICD-10-CM | POA: Diagnosis not present

## 2023-03-19 DIAGNOSIS — R278 Other lack of coordination: Secondary | ICD-10-CM | POA: Diagnosis not present

## 2023-03-21 ENCOUNTER — Non-Acute Institutional Stay (SKILLED_NURSING_FACILITY): Payer: Self-pay | Admitting: Internal Medicine

## 2023-03-21 ENCOUNTER — Encounter: Payer: Self-pay | Admitting: Internal Medicine

## 2023-03-21 DIAGNOSIS — I82412 Acute embolism and thrombosis of left femoral vein: Secondary | ICD-10-CM | POA: Diagnosis not present

## 2023-03-21 DIAGNOSIS — L89152 Pressure ulcer of sacral region, stage 2: Secondary | ICD-10-CM | POA: Diagnosis not present

## 2023-03-21 DIAGNOSIS — F329 Major depressive disorder, single episode, unspecified: Secondary | ICD-10-CM

## 2023-03-21 NOTE — Progress Notes (Deleted)
 Thanks for reaching out to me. I went to see Kelsey Salinas today. I agree we can try something to help her She never did Lexapro. And now she has low Sodium and I would not uses SSRI. Remeron we can try but it does make you more sleepy. It would help her Appetite but it makes you sleepy I want to try Wellbutrin which in my experience helps with the motivation and make them more Alert. If needed I can add Remeron later. Let me know if that is ok.  [name]

## 2023-03-21 NOTE — Progress Notes (Unsigned)
 Location: Medical illustrator of Service:  SNF (31)  Provider:   Code Status: Full Code Goals of Care:     02/21/2023    9:35 AM  Advanced Directives  Does Patient Have a Medical Advance Directive? No  Would patient like information on creating a medical advance directive? No - Patient declined     Chief Complaint  Patient presents with   Acute Visit    HPI: Patient is a 89 y.o. female seen today for an acute visit for Possible Depression Has been living in Rehab since her admission in 01/2023 Her family wants her to eventually get to her Apartment in [place] where she lives with 24/7 help But patient is not able to do her ADLS and Transfers without [facility] Therapy is working with her but she lacks Motivation She sits in her Wheelchair and now also Has Stage 2 Pressure Ulcer She cannot go back to her Apartment till she can do transfers with One assist Her son who is Physician thinks it could be depression Her Caregivers and Nurses also think it could be Subclinical Depression She has lost weight  Has No behaviors Appetite is poor as she says she does not like the food   Other issues Acute DVT in involving left femoral wall point as well as popliteal vein. Also had SVT in the left great saphenous vein  on Eliquis CHF ILD Now Needing Oxygen Saw Dr Isaiah Serge with no new orders    Past Medical History:  Diagnosis Date   Abscess of right leg 12/24/2020   Childhood asthma    Chronic diastolic heart failure (HCC) 12/25/2012   October 2013 echocardiogram 1. There is moderate concentric left ventricle hypertrophy. 2. Left ventricular ejection fraction estimated by 2D at 60-65 percent. 3. There were no regional wall motion abnormalities. 4. Mild mitral annular calcification. 5. Mild mitral valve regurgitation. 6. There is mild tricuspid regurgitation. 7. Mildly elevated estimated right ventricular systolic pressure. 8. Right ventricular systolic pressure estimated  at 35-40 mm Hg. 9. Mild calcification of the aortic valve. 10. Trace aortic valve regurgitation. 11. Analysis of mitral valve inflow, pulmonary vein Doppler and tissue Doppler suggests grade I diastolic dysfunction without elevated left atrial pressure.    Colon polyp    Diverticular stricture (HCC) 2009   Diverticulosis 10/30/2007   DM (diabetes mellitus), type 2 (HCC)    Edema 09/12/2013   Fall 05/18/2017   tripped over wheel of walker; superior and inferior pubic ramus fractures    Hypercholesterolemia    Hypertension    Hypertension, essential    Hypothyroidism    as a teenager   Indigestion    Insomnia    OA (osteoarthritis) of hip    right hip replacement, OA in bilateral knees   Osteopenia    Actonel stopped in 2012 after 5years therapy   Pneumonia 1939   "hospitalized"   Rapid resting heart rate 12/25/2012   Uncertain cause. Possible ectopic atrial tachycardia. Date entered 12/25/12    Right rotator cuff tear    Seasonal allergies    Unsteadiness on feet     Past Surgical History:  Procedure Laterality Date   CATARACT EXTRACTION W/ INTRAOCULAR LENS  IMPLANT, BILATERAL Bilateral    COLONOSCOPY  2009   Dr. Melchor Amour   DILATION AND CURETTAGE OF UTERUS     FRACTURE SURGERY     JOINT REPLACEMENT     ORIF WRIST FRACTURE Right    REDUCTION MAMMAPLASTY  1999  TOTAL HIP ARTHROPLASTY Right 03-04-08   Dr. Lequita Halt    Allergies  Allergen Reactions   Trimethoprim     Per matrix   Amlodipine Besylate Other (See Comments)    Edema   Lisinopril Cough    Cough    Sulfamethoxazole-Trimethoprim Other (See Comments)    Unknown reaction     Outpatient Encounter Medications as of 03/21/2023  Medication Sig   levalbuterol (XOPENEX) 0.31 MG/3ML nebulizer solution Take 3 mLs (0.31 mg total) by nebulization 2 (two) times daily for 5 days. And q 6 prn   acetaminophen (TYLENOL) 500 MG tablet Take 500 mg by mouth every morning.   albuterol (VENTOLIN HFA) 108 (90 Base) MCG/ACT  inhaler Inhale 2 puffs into the lungs every 6 (six) hours as needed for wheezing or shortness of breath.   apixaban (ELIQUIS) 5 MG TABS tablet Take 2 tablets (10 mg total) by mouth 2 (two) times daily for 5 days, THEN 1 tablet (5 mg total) 2 (two) times daily.   atorvastatin (LIPITOR) 40 MG tablet TAKE 1 TABLET ONCE DAILY.   Cholecalciferol 1.25 MG (50000 UT) capsule Take 50,000 Units by mouth every Friday.   diclofenac Sodium (VOLTAREN) 1 % GEL Apply 2 g topically 4 (four) times daily as needed (arthritis pain).   donepezil (ARICEPT) 10 MG tablet TAKE ONE TABLET BY MOUTH AT BEDTIME   famotidine (PEPCID) 20 MG tablet Take 20 mg by mouth every morning.   fexofenadine (ALLEGRA) 180 MG tablet Take 1 tablet (180 mg total) by mouth daily.   Fluticasone-Umeclidin-Vilant (TRELEGY ELLIPTA) 200-62.5-25 MCG/ACT AEPB Inhale 1 puff into the lungs daily.   furosemide (LASIX) 40 MG tablet Take 1 tablet (40 mg total) by mouth every Monday, Wednesday, and Friday.   guaiFENesin (MUCINEX) 600 MG 12 hr tablet Take 1 tablet (600 mg total) by mouth 2 (two) times daily.   memantine (NAMENDA) 10 MG tablet TAKE 1 TABLET BY MOUTH TWICE DAILY.   metoprolol succinate (TOPROL-XL) 50 MG 24 hr tablet Take 1 tablet (50 mg total) by mouth daily. Take with or immediately following a meal.   oxymetazoline (AFRIN NASAL SPRAY) 0.05 % nasal spray Place 2 sprays into both nostrils daily as needed for congestion. Prn for epistaxis   polyethylene glycol (MIRALAX / GLYCOLAX) 17 g packet Take 17 g by mouth daily.   potassium chloride SA (KLOR-CON M) 20 MEQ tablet Take 1 tablet (20 mEq total) by mouth every Monday, Wednesday, and Friday for 1 dose.   saline (AYR) GEL Place 1 Application into both nostrils 2 (two) times daily.   sodium chloride (OCEAN) 0.65 % SOLN nasal spray Place 1 spray into both nostrils as needed for congestion.   vitamin B-12 (CYANOCOBALAMIN) 1000 MCG tablet Take 1 tablet (1,000 mcg total) by mouth daily.   No  facility-administered encounter medications on file as of 03/21/2023.    Review of Systems:  Review of Systems  Constitutional:  Positive for activity change and appetite change.  HENT: Negative.    Respiratory:  Positive for cough and shortness of breath.   Cardiovascular:  Negative for leg swelling.  Gastrointestinal:  Negative for constipation.  Genitourinary: Negative.   Musculoskeletal:  Positive for gait problem. Negative for arthralgias and myalgias.  Skin:  Positive for wound.  Neurological:  Positive for weakness. Negative for dizziness.  Psychiatric/Behavioral:  Positive for confusion and dysphoric mood. Negative for sleep disturbance.     Health Maintenance  Topic Date Due   OPHTHALMOLOGY EXAM  07/11/2020  Medicare Annual Wellness (AWV)  05/14/2023   FOOT EXAM  05/04/2023   HEMOGLOBIN A1C  07/18/2023   DTaP/Tdap/Td (2 - Td or Tdap) 07/20/2027   Pneumonia Vaccine 44+ Years old  Completed   INFLUENZA VACCINE  Completed   DEXA SCAN  Completed   COVID-19 Vaccine  Completed   Zoster Vaccines- Shingrix  Completed   HPV VACCINES  Aged Out    Physical Exam: Vitals:   03/21/23 1935  BP: 134/81  Pulse: 81  Resp: 12  Temp: 97.6 F (36.4 C)  Weight: 156 lb 12.8 oz (71.1 kg)   Body mass index is 23.84 kg/m. Physical Exam Vitals reviewed.  Constitutional:      Appearance: Normal appearance.  HENT:     Head: Normocephalic.     Nose: Nose normal.     Mouth/Throat:     Mouth: Mucous membranes are moist.     Pharynx: Oropharynx is clear.  Eyes:     Pupils: Pupils are equal, round, and reactive to light.  Cardiovascular:     Rate and Rhythm: Normal rate and regular rhythm.     Pulses: Normal pulses.     Heart sounds: Normal heart sounds. No murmur heard. Pulmonary:     Effort: Pulmonary effort is normal.     Breath sounds: Rales present.  Abdominal:     General: Abdomen is flat. Bowel sounds are normal.     Palpations: Abdomen is soft.  Musculoskeletal:         General: No swelling.     Cervical back: Neck supple.  Skin:    General: Skin is warm.  Neurological:     General: No focal deficit present.     Mental Status: She is alert.  Psychiatric:        Mood and Affect: Mood normal.        Thought Content: Thought content normal.     Labs reviewed: Basic Metabolic Panel: Recent Labs    04/29/22 0000 08/24/22 0000 01/18/23 0000 01/20/23 0154 01/20/23 1610 01/21/23 0259 02/06/23 2351 02/08/23 0902 02/09/23 0627 02/15/23 0000  NA 142   < > 141   < >  --    < > 137 138 136 139  K 4.3   < > 4.1   < >  --    < > 4.1 4.0 4.1 4.0  CL  --    < > 106   < >  --    < > 105 106 105 107  CO2 23*  --  23*   < >  --    < > 19* 23 19* 21  GLUCOSE  --   --   --    < >  --    < > 151* 155* 191*  --   BUN 23*   < > 26*   < >  --    < > 55* 51* 49* 27*  CREATININE 1.1   < > 1.3*   < >  --    < > 1.56* 1.48* 1.37* 1.1  CALCIUM 9.2   < > 9.2   < >  --    < > 8.5* 8.6* 8.7* 8.5*  MG  --   --   --   --  2.1  --   --  2.4  --   --   TSH 1.07  --  1.29  --   --   --   --   --   --   --    < > =  values in this interval not displayed.   Liver Function Tests: Recent Labs    01/18/23 0000 01/20/23 0154 02/08/23 0902  AST 22 19 17   ALT 16 15 24   ALKPHOS 108 82 88  BILITOT  --  1.5* 0.7  PROT  --  6.8 6.1*  ALBUMIN 3.8 3.4* 2.9*   No results for input(s): "LIPASE", "AMYLASE" in the last 8760 hours. No results for input(s): "AMMONIA" in the last 8760 hours. CBC: Recent Labs    01/20/23 0154 01/20/23 0526 02/06/23 2351 02/08/23 0902 02/09/23 0627  WBC 13.6*   < > 13.4* 10.9* 10.6*  NEUTROABS 11.2*  --   --  7.8*  --   HGB 15.6*   < > 14.2 14.2 13.5  HCT 48.7*   < > 46.2* 44.5 42.4  MCV 95.9   < > 98.3 94.3 94.2  PLT 128*   < > 164 158 177   < > = values in this interval not displayed.   Lipid Panel: Recent Labs    04/29/22 0000 01/18/23 0000  CHOL 159 146  HDL 62 56  LDLCALC 73 65  TRIG 120 126   Lab Results  Component Value  Date   HGBA1C 6.8 01/18/2023    Procedures since last visit: No results found.  Assessment/Plan 1. Reactive depression (Primary) Will Try Wellbutrin 150 mg QAM If needed Remeron can be added Latter Avoid SSRI due to Hyponatremia  2. Pressure injury of sacral region, stage 2 (HCC) Wound looks good Silver Alginate  3. Acute deep vein thrombosis (DVT) of femoral vein of left lower extremity (HCC) Eliquis   Labs/tests ordered:  * No order type specified * Next appt:  05/17/2023

## 2023-03-22 DIAGNOSIS — J9601 Acute respiratory failure with hypoxia: Secondary | ICD-10-CM | POA: Diagnosis not present

## 2023-03-22 DIAGNOSIS — B351 Tinea unguium: Secondary | ICD-10-CM | POA: Diagnosis not present

## 2023-03-22 DIAGNOSIS — R278 Other lack of coordination: Secondary | ICD-10-CM | POA: Diagnosis not present

## 2023-03-22 DIAGNOSIS — M62561 Muscle wasting and atrophy, not elsewhere classified, right lower leg: Secondary | ICD-10-CM | POA: Diagnosis not present

## 2023-03-22 DIAGNOSIS — M2041 Other hammer toe(s) (acquired), right foot: Secondary | ICD-10-CM | POA: Diagnosis not present

## 2023-03-22 DIAGNOSIS — M62562 Muscle wasting and atrophy, not elsewhere classified, left lower leg: Secondary | ICD-10-CM | POA: Diagnosis not present

## 2023-03-22 DIAGNOSIS — L84 Corns and callosities: Secondary | ICD-10-CM | POA: Diagnosis not present

## 2023-03-22 DIAGNOSIS — R2689 Other abnormalities of gait and mobility: Secondary | ICD-10-CM | POA: Diagnosis not present

## 2023-03-22 DIAGNOSIS — M2042 Other hammer toe(s) (acquired), left foot: Secondary | ICD-10-CM | POA: Diagnosis not present

## 2023-03-22 DIAGNOSIS — R531 Weakness: Secondary | ICD-10-CM | POA: Diagnosis not present

## 2023-03-23 DIAGNOSIS — M62562 Muscle wasting and atrophy, not elsewhere classified, left lower leg: Secondary | ICD-10-CM | POA: Diagnosis not present

## 2023-03-23 DIAGNOSIS — M62561 Muscle wasting and atrophy, not elsewhere classified, right lower leg: Secondary | ICD-10-CM | POA: Diagnosis not present

## 2023-03-23 DIAGNOSIS — R531 Weakness: Secondary | ICD-10-CM | POA: Diagnosis not present

## 2023-03-23 DIAGNOSIS — R278 Other lack of coordination: Secondary | ICD-10-CM | POA: Diagnosis not present

## 2023-03-23 DIAGNOSIS — J9601 Acute respiratory failure with hypoxia: Secondary | ICD-10-CM | POA: Diagnosis not present

## 2023-03-23 DIAGNOSIS — R2689 Other abnormalities of gait and mobility: Secondary | ICD-10-CM | POA: Diagnosis not present

## 2023-03-24 DIAGNOSIS — R2689 Other abnormalities of gait and mobility: Secondary | ICD-10-CM | POA: Diagnosis not present

## 2023-03-24 DIAGNOSIS — R278 Other lack of coordination: Secondary | ICD-10-CM | POA: Diagnosis not present

## 2023-03-24 DIAGNOSIS — M62562 Muscle wasting and atrophy, not elsewhere classified, left lower leg: Secondary | ICD-10-CM | POA: Diagnosis not present

## 2023-03-24 DIAGNOSIS — R531 Weakness: Secondary | ICD-10-CM | POA: Diagnosis not present

## 2023-03-24 DIAGNOSIS — M62561 Muscle wasting and atrophy, not elsewhere classified, right lower leg: Secondary | ICD-10-CM | POA: Diagnosis not present

## 2023-03-24 DIAGNOSIS — J9601 Acute respiratory failure with hypoxia: Secondary | ICD-10-CM | POA: Diagnosis not present

## 2023-03-25 DIAGNOSIS — J9601 Acute respiratory failure with hypoxia: Secondary | ICD-10-CM | POA: Diagnosis not present

## 2023-03-25 DIAGNOSIS — M62561 Muscle wasting and atrophy, not elsewhere classified, right lower leg: Secondary | ICD-10-CM | POA: Diagnosis not present

## 2023-03-25 DIAGNOSIS — R2689 Other abnormalities of gait and mobility: Secondary | ICD-10-CM | POA: Diagnosis not present

## 2023-03-25 DIAGNOSIS — R278 Other lack of coordination: Secondary | ICD-10-CM | POA: Diagnosis not present

## 2023-03-25 DIAGNOSIS — M62562 Muscle wasting and atrophy, not elsewhere classified, left lower leg: Secondary | ICD-10-CM | POA: Diagnosis not present

## 2023-03-25 DIAGNOSIS — R531 Weakness: Secondary | ICD-10-CM | POA: Diagnosis not present

## 2023-03-29 DIAGNOSIS — M62561 Muscle wasting and atrophy, not elsewhere classified, right lower leg: Secondary | ICD-10-CM | POA: Diagnosis not present

## 2023-03-29 DIAGNOSIS — R531 Weakness: Secondary | ICD-10-CM | POA: Diagnosis not present

## 2023-03-29 DIAGNOSIS — R2689 Other abnormalities of gait and mobility: Secondary | ICD-10-CM | POA: Diagnosis not present

## 2023-03-29 DIAGNOSIS — R278 Other lack of coordination: Secondary | ICD-10-CM | POA: Diagnosis not present

## 2023-03-29 DIAGNOSIS — J9601 Acute respiratory failure with hypoxia: Secondary | ICD-10-CM | POA: Diagnosis not present

## 2023-03-29 DIAGNOSIS — M62562 Muscle wasting and atrophy, not elsewhere classified, left lower leg: Secondary | ICD-10-CM | POA: Diagnosis not present

## 2023-03-30 DIAGNOSIS — R531 Weakness: Secondary | ICD-10-CM | POA: Diagnosis not present

## 2023-03-30 DIAGNOSIS — R278 Other lack of coordination: Secondary | ICD-10-CM | POA: Diagnosis not present

## 2023-03-30 DIAGNOSIS — M62562 Muscle wasting and atrophy, not elsewhere classified, left lower leg: Secondary | ICD-10-CM | POA: Diagnosis not present

## 2023-03-30 DIAGNOSIS — J9601 Acute respiratory failure with hypoxia: Secondary | ICD-10-CM | POA: Diagnosis not present

## 2023-03-30 DIAGNOSIS — R2689 Other abnormalities of gait and mobility: Secondary | ICD-10-CM | POA: Diagnosis not present

## 2023-03-30 DIAGNOSIS — M62561 Muscle wasting and atrophy, not elsewhere classified, right lower leg: Secondary | ICD-10-CM | POA: Diagnosis not present

## 2023-03-31 DIAGNOSIS — R278 Other lack of coordination: Secondary | ICD-10-CM | POA: Diagnosis not present

## 2023-03-31 DIAGNOSIS — M62562 Muscle wasting and atrophy, not elsewhere classified, left lower leg: Secondary | ICD-10-CM | POA: Diagnosis not present

## 2023-03-31 DIAGNOSIS — M62561 Muscle wasting and atrophy, not elsewhere classified, right lower leg: Secondary | ICD-10-CM | POA: Diagnosis not present

## 2023-03-31 DIAGNOSIS — R2689 Other abnormalities of gait and mobility: Secondary | ICD-10-CM | POA: Diagnosis not present

## 2023-03-31 DIAGNOSIS — J9601 Acute respiratory failure with hypoxia: Secondary | ICD-10-CM | POA: Diagnosis not present

## 2023-03-31 DIAGNOSIS — R531 Weakness: Secondary | ICD-10-CM | POA: Diagnosis not present

## 2023-04-14 ENCOUNTER — Non-Acute Institutional Stay (SKILLED_NURSING_FACILITY): Payer: Self-pay | Admitting: Adult Health

## 2023-04-14 DIAGNOSIS — N183 Chronic kidney disease, stage 3 unspecified: Secondary | ICD-10-CM | POA: Diagnosis not present

## 2023-04-14 DIAGNOSIS — I10 Essential (primary) hypertension: Secondary | ICD-10-CM | POA: Diagnosis not present

## 2023-04-14 DIAGNOSIS — F03B0 Unspecified dementia, moderate, without behavioral disturbance, psychotic disturbance, mood disturbance, and anxiety: Secondary | ICD-10-CM

## 2023-04-14 DIAGNOSIS — J439 Emphysema, unspecified: Secondary | ICD-10-CM | POA: Diagnosis not present

## 2023-04-14 DIAGNOSIS — I82412 Acute embolism and thrombosis of left femoral vein: Secondary | ICD-10-CM

## 2023-04-14 DIAGNOSIS — Z8709 Personal history of other diseases of the respiratory system: Secondary | ICD-10-CM

## 2023-04-14 DIAGNOSIS — F329 Major depressive disorder, single episode, unspecified: Secondary | ICD-10-CM

## 2023-04-14 DIAGNOSIS — E1122 Type 2 diabetes mellitus with diabetic chronic kidney disease: Secondary | ICD-10-CM | POA: Diagnosis not present

## 2023-04-14 DIAGNOSIS — I5032 Chronic diastolic (congestive) heart failure: Secondary | ICD-10-CM | POA: Diagnosis not present

## 2023-04-15 ENCOUNTER — Encounter: Payer: Self-pay | Admitting: Adult Health

## 2023-04-15 MED ORDER — PRO-STAT 64 PO LIQD: 30.0000 mL | Freq: Every day | ORAL | Status: DC

## 2023-04-15 MED ORDER — AYR SALINE NASAL NA GEL: 1.0000 | Freq: Two times a day (BID) | NASAL | Status: DC | PRN

## 2023-04-15 MED ORDER — BUPROPION HCL ER (XL) 150 MG PO TB24: 150.0000 mg | ORAL_TABLET | Freq: Every day | ORAL | Status: AC

## 2023-04-15 NOTE — Progress Notes (Addendum)
 Location:  Medical illustrator of Service:  SNF (31) Provider:   Peggye Ley, ANP [facility] [id number]   [name], MD  Patient Care Team: [name], MD as PCP - General (Internal Medicine) Jodelle Red, MD as PCP - Cardiology (Cardiology) Ollen Gross, MD as Consulting Physician (Orthopedic Surgery) Lyn Records, MD (Inactive) as Consulting Physician (Cardiology) Mateo Flow, MD as Consulting Physician (Ophthalmology) Salvatore Marvel, MD as Consulting Physician (Orthopedic Surgery)  Extended Emergency Contact Information Primary Emergency Contact: Ludwig [place] Home Phone: [phone] Mobile Phone: [phone] Relation: Daughter Secondary Emergency Contact: [facility] Home Phone: [phone] Mobile Phone: [phone] Relation: Niece  Code Status:  Full code  Goals of care: Advanced Directive information    02/21/2023    9:35 AM  Advanced Directives  Does Patient Have a Medical Advance Directive? No  Would patient like information on creating a medical advance directive? No - Patient declined     Chief Complaint  Patient presents with   Medical Management of Chronic Issues    HPI:  Pt is a 89 y.o. female seen today for medical management of chronic diseases.    She has a history of interstitial lung disease and emphysema found on high res CT and is followed by pulmonary. Has a chronic dry cough. No feelings of sob. Sats have been normal on RA. Currently on trelegy.  She is doing well in this regard and has a f/u CT of the chest scheduled per pulmonary. Had CT 01/20/23 showing posterior RUL pna.   She is on Eliquis for an acute deep vein thrombosis (DVT) found in January 2025 in the left common femoral vein. She was seen by vascular surgery on February 17, 2023, and they recommended continuing Eliquis indefinitely along with compression stockings. She is on lasix  three times weekly. Weight is trending down with less edema also taking in less.  Wt Readings from Last 3 Encounters:  04/15/23 150 lb 6.4 oz (68.2 kg)  03/21/23 156 lb 12.8 oz (71.1 kg)  03/18/23 179 lb (81.2 kg)    She was started on Wellbutrin due to concern for depression 03/21/23.  Staff has noted mild improvement. She denies depression but is angry she is moving to skilled care.    Hx of type 2 DM diet controlled Lab Results  Component Value Date   HGBA1C 6.8 01/18/2023     Memory loss MMSE 22/30 on aricept and namenda.  Needs a hoyer lift for transfers and assistance with ADLs. Waiting on a permanent skilled room  HTN controlled off losartan  CKD  Lab Results  Component Value Date   BUN 27 (A) 02/15/2023   Lab Results  Component Value Date   CREATININE 1.1 02/15/2023    HLD on statin Lab Results  Component Value Date   LDLCALC 65 01/18/2023   Chronic diastolic CHF hx of tachycardia followed by cardiology Ef 60-65% diastolic dysfunction grade 1 severe LAE Weight trending down Rate controlled  On lasix three times weekly   Has pressure injury to buttocks that has prolonged healing but is improving with less slough tissue. On protein supplement.   Past Medical History:  Diagnosis Date   Abscess of right leg 12/24/2020   Childhood asthma    Chronic diastolic heart failure (HCC) 12/25/2012   October 2013 echocardiogram 1. There is moderate concentric left ventricle hypertrophy. 2. Left ventricular ejection fraction estimated by 2D at 60-65 percent. 3. There were no  regional wall motion abnormalities. 4. Mild mitral annular calcification. 5. Mild mitral valve regurgitation. 6. There is mild tricuspid regurgitation. 7. Mildly elevated estimated right ventricular systolic pressure. 8. Right ventricular systolic pressure estimated at 35-40 mm Hg. 9. Mild calcification of the aortic valve. 10. Trace aortic valve regurgitation. 11. Analysis of mitral valve inflow,  pulmonary vein Doppler and tissue Doppler suggests grade I diastolic dysfunction without elevated left atrial pressure.    Colon polyp    Diverticular stricture (HCC) 2009   Diverticulosis 10/30/2007   DM (diabetes mellitus), type 2 (HCC)    Edema 09/12/2013   Fall 05/18/2017   tripped over wheel of walker; superior and inferior pubic ramus fractures    Hypercholesterolemia    Hypertension    Hypertension, essential    Hypothyroidism    as a teenager   Indigestion    Insomnia    OA (osteoarthritis) of hip    right hip replacement, OA in bilateral knees   Osteopenia    Actonel stopped in 2012 after 5years therapy   Pneumonia 1939   "hospitalized"   Rapid resting heart rate 12/25/2012   Uncertain cause. Possible ectopic atrial tachycardia. Date entered 12/25/12    Right rotator cuff tear    Seasonal allergies    Unsteadiness on feet    Past Surgical History:  Procedure Laterality Date   CATARACT EXTRACTION W/ INTRAOCULAR LENS  IMPLANT, BILATERAL Bilateral    COLONOSCOPY  2009   Dr. Melchor Amour   DILATION AND CURETTAGE OF UTERUS     FRACTURE SURGERY     JOINT REPLACEMENT     ORIF WRIST FRACTURE Right    REDUCTION MAMMAPLASTY  1999   TOTAL HIP ARTHROPLASTY Right 03-04-08   Dr. Lequita Halt    Allergies  Allergen Reactions   Trimethoprim     Per matrix   Amlodipine Besylate Other (See Comments)    Edema   Lisinopril Cough    Cough    Sulfamethoxazole-Trimethoprim Other (See Comments)    Unknown reaction     Outpatient Encounter Medications as of 04/14/2023  Medication Sig   Amino Acids-Protein Hydrolys (FEEDING SUPPLEMENT, PRO-STAT 64,) LIQD Take 30 mLs by mouth daily.   buPROPion (WELLBUTRIN XL) 150 MG 24 hr tablet Take 1 tablet (150 mg total) by mouth daily.   acetaminophen (TYLENOL) 500 MG tablet Take 500 mg by mouth every morning.   albuterol (VENTOLIN HFA) 108 (90 Base) MCG/ACT inhaler Inhale 2 puffs into the lungs every 6 (six) hours as needed for wheezing or  shortness of breath.   apixaban (ELIQUIS) 5 MG TABS tablet Take 2 tablets (10 mg total) by mouth 2 (two) times daily for 5 days, THEN 1 tablet (5 mg total) 2 (two) times daily.   atorvastatin (LIPITOR) 40 MG tablet TAKE 1 TABLET ONCE DAILY.   Cholecalciferol 1.25 MG (50000 UT) capsule Take 50,000 Units by mouth every Friday.   diclofenac Sodium (VOLTAREN) 1 % GEL Apply 2 g topically 4 (four) times daily as needed (arthritis pain).   donepezil (ARICEPT) 10 MG tablet TAKE ONE TABLET BY MOUTH AT BEDTIME   famotidine (PEPCID) 20 MG tablet Take 20 mg by mouth every morning.   fexofenadine (ALLEGRA) 180 MG tablet Take 1 tablet (180 mg total) by mouth daily.   Fluticasone-Umeclidin-Vilant (TRELEGY ELLIPTA) 200-62.5-25 MCG/ACT AEPB Inhale 1 puff into the lungs daily.   furosemide (LASIX) 40 MG tablet Take 1 tablet (40 mg total) by mouth every Monday, Wednesday, and Friday.   guaiFENesin (MUCINEX) 600  MG 12 hr tablet Take 1 tablet (600 mg total) by mouth 2 (two) times daily.   levalbuterol (XOPENEX) 0.31 MG/3ML nebulizer solution Take 3 mLs (0.31 mg total) by nebulization 2 (two) times daily for 5 days. And q 6 prn   memantine (NAMENDA) 10 MG tablet TAKE 1 TABLET BY MOUTH TWICE DAILY.   metoprolol succinate (TOPROL-XL) 50 MG 24 hr tablet Take 1 tablet (50 mg total) by mouth daily. Take with or immediately following a meal.   oxymetazoline (AFRIN NASAL SPRAY) 0.05 % nasal spray Place 2 sprays into both nostrils daily as needed for congestion. Prn for epistaxis   polyethylene glycol (MIRALAX / GLYCOLAX) 17 g packet Take 17 g by mouth daily.   potassium chloride SA (KLOR-CON M) 20 MEQ tablet Take 1 tablet (20 mEq total) by mouth every Monday, Wednesday, and Friday for 1 dose.   saline (AYR) GEL Place 1 Application into both nostrils 2 (two) times daily as needed.   sodium chloride (OCEAN) 0.65 % SOLN nasal spray Place 1 spray into both nostrils as needed for congestion.   vitamin B-12 (CYANOCOBALAMIN) 1000  MCG tablet Take 1 tablet (1,000 mcg total) by mouth daily.   [DISCONTINUED] saline (AYR) GEL Place 1 Application into both nostrils 2 (two) times daily.   No facility-administered encounter medications on file as of 04/14/2023.    Review of Systems  Constitutional:  Negative for activity change, appetite change, chills, diaphoresis, fatigue, fever and unexpected weight change.  HENT:  Negative for congestion.   Respiratory:  Positive for cough. Negative for shortness of breath and wheezing.   Cardiovascular:  Positive for leg swelling. Negative for chest pain and palpitations.  Gastrointestinal:  Negative for abdominal distention, abdominal pain, constipation and diarrhea.  Genitourinary:  Negative for difficulty urinating and dysuria.  Musculoskeletal:  Positive for gait problem. Negative for arthralgias, back pain, joint swelling and myalgias.  Neurological:  Positive for weakness (general). Negative for dizziness, tremors, seizures, syncope, facial asymmetry, speech difficulty, light-headedness, numbness and headaches.  Psychiatric/Behavioral:  Positive for dysphoric mood. Negative for agitation, behavioral problems and confusion.     Immunization History  Administered Date(s) Administered   Fluad Quad(high Dose 65+) 10/13/2021   Fluad Trivalent(High Dose 65+) 10/14/2022   Influenza, High Dose Seasonal PF 10/20/2018, 11/09/2019   Influenza,inj,Quad PF,6+ Mos 11/04/2017   Influenza-Unspecified 10/25/2013, 10/31/2014, 11/06/2015, 11/01/2016, 10/31/2020   Moderna Covid-19 Vaccine Bivalent Booster 40yrs & up 10/24/2020   Moderna SARS-COV2 Booster Vaccination 11/27/2019   Moderna Sars-Covid-2 Vaccination 01/23/2019, 02/20/2019, 06/15/2021, 11/11/2021   Pneumococcal Conjugate-13 06/04/2014   Pneumococcal Polysaccharide-23 01/12/2000   Tdap 07/19/2017   Unspecified SARS-COV-2 Vaccination 10/14/2022   Zoster Recombinant(Shingrix) 05/21/2021, 09/16/2021   Pertinent  Health Maintenance Due   Topic Date Due   OPHTHALMOLOGY EXAM  07/11/2020   FOOT EXAM  05/04/2023   HEMOGLOBIN A1C  07/18/2023   INFLUENZA VACCINE  08/12/2023   DEXA SCAN  Completed      12/29/2021    9:22 AM 05/04/2022   10:24 AM 05/14/2022   12:59 PM 08/31/2022   10:42 AM 01/25/2023    3:51 PM  Fall Risk  Falls in the past year? 0 0 0 0 1  Was there an injury with Fall? 0 0 0 0 0  Fall Risk Category Calculator 0 0 0 0 1  Fall Risk Category (Retired) Low      (RETIRED) Patient Fall Risk Level Low fall risk      Patient at Risk for Falls Due  to No Fall Risks Impaired balance/gait;Impaired mobility Impaired balance/gait;Impaired mobility No Fall Risks History of fall(s);Impaired balance/gait  Fall risk Follow up Falls evaluation completed Falls evaluation completed  Falls evaluation completed Falls evaluation completed;Education provided   Functional Status Survey:    Vitals:   04/15/23 1055  BP: (!) 166/83  Pulse: 62  Resp: 17  Temp: 97.7 F (36.5 C)  SpO2: 94%  Weight: 150 lb 6.4 oz (68.2 kg)   Body mass index is 22.87 kg/m. Physical Exam Vitals and nursing note reviewed.  Constitutional:      General: She is not in acute distress.    Appearance: Normal appearance. She is not diaphoretic.  HENT:     Head: Normocephalic and atraumatic.     Mouth/Throat:     Mouth: Mucous membranes are moist.     Pharynx: Oropharynx is clear.  Neck:     Thyroid: No thyromegaly.     Vascular: No carotid bruit or JVD.  Cardiovascular:     Rate and Rhythm: Normal rate and regular rhythm.     Heart sounds: Normal heart sounds. No murmur heard. Pulmonary:     Effort: Pulmonary effort is normal. No respiratory distress.     Breath sounds: Normal breath sounds. No stridor.  Abdominal:     General: Bowel sounds are normal. There is no distension.     Palpations: Abdomen is soft.     Tenderness: There is no abdominal tenderness. There is no right CVA tenderness or left CVA tenderness.  Musculoskeletal:      Cervical back: No rigidity. No muscular tenderness.     Comments: BLE edem R>L +1  Lymphadenopathy:     Cervical: No cervical adenopathy.  Skin:    General: Skin is warm and dry.     Comments: Circular wound to sacral area with 100% pink tissue.mild surrounding erythema   Neurological:     Mental Status: She is alert. Mental status is at baseline.  Psychiatric:     Comments: flat     Labs reviewed: Recent Labs    01/20/23 0843 01/21/23 0259 02/06/23 2351 02/08/23 0902 02/09/23 0627 02/15/23 0000  NA  --    < > 137 138 136 139  K  --    < > 4.1 4.0 4.1 4.0  CL  --    < > 105 106 105 107  CO2  --    < > 19* 23 19* 21  GLUCOSE  --    < > 151* 155* 191*  --   BUN  --    < > 55* 51* 49* 27*  CREATININE  --    < > 1.56* 1.48* 1.37* 1.1  CALCIUM  --    < > 8.5* 8.6* 8.7* 8.5*  MG 2.1  --   --  2.4  --   --    < > = values in this interval not displayed.   Recent Labs    01/18/23 0000 01/20/23 0154 02/08/23 0902  AST 22 19 17   ALT 16 15 24   ALKPHOS 108 82 88  BILITOT  --  1.5* 0.7  PROT  --  6.8 6.1*  ALBUMIN 3.8 3.4* 2.9*   Recent Labs    01/20/23 0154 01/20/23 0526 02/06/23 2351 02/08/23 0902 02/09/23 0627  WBC 13.6*   < > 13.4* 10.9* 10.6*  NEUTROABS 11.2*  --   --  7.8*  --   HGB 15.6*   < > 14.2 14.2 13.5  HCT 48.7*   < >  46.2* 44.5 42.4  MCV 95.9   < > 98.3 94.3 94.2  PLT 128*   < > 164 158 177   < > = values in this interval not displayed.   Lab Results  Component Value Date   TSH 1.29 01/18/2023   Lab Results  Component Value Date   HGBA1C 6.8 01/18/2023   Lab Results  Component Value Date   CHOL 146 01/18/2023   HDL 56 01/18/2023   LDLCALC 65 01/18/2023   TRIG 126 01/18/2023    Significant Diagnostic Results in last 30 days:  No results found.  Assessment/Plan  1. Reactive depression (Primary) Slight improvement with wellbutrin Will continue current dosing and give more time to work Watch her weight due to weight loss.   2.  Pulmonary emphysema, unspecified emphysema type (HCC) On trelegy Followed by pulmonary  3. H/O pulmonary fibrosis Off 02 Has not needed prn albuterol CT monitoring per pulmonary   4. Acute deep vein thrombosis (DVT) of femoral vein of left lower extremity (HCC) On eliquis life long Compression hose.   5. Moderate dementia, unspecified dementia type, unspecified whether behavioral, psychotic, or mood disturbance or anxiety (HCC) Waiting on skilled care bed  On namenda and aricept.   6. Type 2 diabetes mellitus with stage 3 chronic kidney disease, without long-term current use of insulin, unspecified whether stage 3a or 3b CKD (HCC) Diet controlled  Recommend routine eye exam.   7. Chronic diastolic CHF (congestive heart failure) (HCC) Compensated on lasix three times weekly Off arm for right now.   8. HTN BPs are quite variable between 110-160 systolic Consider additional meds, f/u at next visit.     Labs/tests ordered:  BMP A1C  prior to next visit.

## 2023-04-28 ENCOUNTER — Encounter: Payer: Self-pay | Admitting: Orthopedic Surgery

## 2023-04-28 ENCOUNTER — Non-Acute Institutional Stay (SKILLED_NURSING_FACILITY): Payer: Self-pay | Admitting: Orthopedic Surgery

## 2023-04-28 DIAGNOSIS — J439 Emphysema, unspecified: Secondary | ICD-10-CM | POA: Diagnosis not present

## 2023-04-28 DIAGNOSIS — Z8709 Personal history of other diseases of the respiratory system: Secondary | ICD-10-CM | POA: Diagnosis not present

## 2023-04-28 DIAGNOSIS — E1122 Type 2 diabetes mellitus with diabetic chronic kidney disease: Secondary | ICD-10-CM

## 2023-04-28 DIAGNOSIS — I10 Essential (primary) hypertension: Secondary | ICD-10-CM

## 2023-04-28 DIAGNOSIS — L89152 Pressure ulcer of sacral region, stage 2: Secondary | ICD-10-CM

## 2023-04-28 DIAGNOSIS — F03B0 Unspecified dementia, moderate, without behavioral disturbance, psychotic disturbance, mood disturbance, and anxiety: Secondary | ICD-10-CM | POA: Diagnosis not present

## 2023-04-28 DIAGNOSIS — F329 Major depressive disorder, single episode, unspecified: Secondary | ICD-10-CM | POA: Diagnosis not present

## 2023-04-28 DIAGNOSIS — I82412 Acute embolism and thrombosis of left femoral vein: Secondary | ICD-10-CM

## 2023-04-28 DIAGNOSIS — E7800 Pure hypercholesterolemia, unspecified: Secondary | ICD-10-CM | POA: Diagnosis not present

## 2023-04-28 DIAGNOSIS — N183 Chronic kidney disease, stage 3 unspecified: Secondary | ICD-10-CM

## 2023-04-28 DIAGNOSIS — I5032 Chronic diastolic (congestive) heart failure: Secondary | ICD-10-CM

## 2023-04-28 NOTE — Progress Notes (Signed)
 Location:  [facility] Room Number: 158/A Place of Service:  SNF [phone]) Provider:  Octavia Heir, NP   [name], MD  Patient Care Team: [name], MD as PCP - General (Internal Medicine) Jodelle Red, MD as PCP - Cardiology (Cardiology) Ollen Gross, MD as Consulting Physician (Orthopedic Surgery) Lyn Records, MD (Inactive) as Consulting Physician (Cardiology) Mateo Flow, MD as Consulting Physician (Ophthalmology) Salvatore Marvel, MD as Consulting Physician (Orthopedic Surgery)  Extended Emergency Contact Information Primary Emergency Contact: Ludwig [place] Home Phone: [phone] Mobile Phone: [phone] Relation: Daughter Secondary Emergency Contact: [facility] Home Phone: [phone] Mobile Phone: [phone] Relation: Niece  Code Status:  DNR Goals of care: Advanced Directive information    02/21/2023    9:35 AM  Advanced Directives  Does Patient Have a Medical Advance Directive? No  Would patient like information on creating a medical advance directive? No - Patient declined     Chief Complaint  Patient presents with   Discharge Note    HPI:  Pt is a 89 y.o. female seen today for discharge evaluation.   She currently resides on the rehabilitation unit at [facility]. PMH: CHF, tachycardia, HTN, HLD, T2DM, Alzheimer's dementia, OA, osteopenia, DDD, CKD, memory loss and vitamin D deficiency.    Hospitalized 01/09-01/13 due to AMS and weakness. She was diagnosed with acute respiratory failure due to pneumonia and acute CHF. BNP was 1420 at admission, reduced to 125.6 second hospitalization. LVEF 60-65%. Remains on furosemide/KCL. BUN/creat 24/1.1 , K+ 4.0 02/15/2023.   Hospitalized 01/26-01/29 due to DVT. Found to have acute DVT of left common femoral vein, left femoral vein, left proximal profunda, left popliteal, and acute superficial vein thrombus. Received heparin  gtt for brief period of time and transitioned to Eliquis.   Also diagnosed with interstitial lung (emphysema/PF) disease within past year. She was dependent on 2-3 liters oxygen for awhile. Off oxygen at this time. Followed by pulmonary. Remains on Trelegy, mucinex, levalbuterol prn and nocturnal oxygen. F/u CT chest in 6 months recommended.   03/10 Wellbutrin prescribed due to lack of motivation. Improved mood per nursing. SSRI avoided due to past h/o hyponatremia.   MMSE 22/30. Remains on Aricept and Namenda.   T2DM diet controlled. A1c 6.8 01/18/2023.   HTN controled with metoprolol.  HLD controlled with atorvastatin. Total 126, LDL 65 01/18/2023.   Stage II sacral PU. Dressing changes every other day. Remains on Prostat. Followed by dietary.   At this time she is 1-2 person assist with transfers. Dependent with ADLs except feeding. Ambulates with w/c, hoyer transfer. She does have caregiver with her. Plan to discharge next week to SNF.     Past Medical History:  Diagnosis Date   Abscess of right leg 12/24/2020   Childhood asthma    Chronic diastolic heart failure (HCC) 12/25/2012   October 2013 echocardiogram 1. There is moderate concentric left ventricle hypertrophy. 2. Left ventricular ejection fraction estimated by 2D at 60-65 percent. 3. There were no regional wall motion abnormalities. 4. Mild mitral annular calcification. 5. Mild mitral valve regurgitation. 6. There is mild tricuspid regurgitation. 7. Mildly elevated estimated right ventricular systolic pressure. 8. Right ventricular systolic pressure estimated at 35-40 mm Hg. 9. Mild calcification of the aortic valve. 10. Trace aortic valve regurgitation. 11. Analysis of mitral valve inflow, pulmonary vein Doppler and tissue Doppler suggests grade I diastolic dysfunction without elevated left atrial pressure.    Colon polyp    Diverticular stricture (  HCC) 2009   Diverticulosis 10/30/2007   DM (diabetes mellitus), type 2 (HCC)     Edema 09/12/2013   Fall 05/18/2017   tripped over wheel of walker; superior and inferior pubic ramus fractures    Hypercholesterolemia    Hypertension    Hypertension, essential    Hypothyroidism    as a teenager   Indigestion    Insomnia    OA (osteoarthritis) of hip    right hip replacement, OA in bilateral knees   Osteopenia    Actonel stopped in 2012 after 5years therapy   Pneumonia 1939   "hospitalized"   Rapid resting heart rate 12/25/2012   Uncertain cause. Possible ectopic atrial tachycardia. Date entered 12/25/12    Right rotator cuff tear    Seasonal allergies    Unsteadiness on feet    Past Surgical History:  Procedure Laterality Date   CATARACT EXTRACTION W/ INTRAOCULAR LENS  IMPLANT, BILATERAL Bilateral    COLONOSCOPY  2009   Dr. Delberta Fee   DILATION AND CURETTAGE OF UTERUS     FRACTURE SURGERY     JOINT REPLACEMENT     ORIF WRIST FRACTURE Right    REDUCTION MAMMAPLASTY  1999   TOTAL HIP ARTHROPLASTY Right 03-04-08   Dr. France Ina    Allergies  Allergen Reactions   Trimethoprim     Per matrix   Amlodipine Besylate Other (See Comments)    Edema   Lisinopril Cough    Cough    Sulfamethoxazole-Trimethoprim Other (See Comments)    Unknown reaction     Outpatient Encounter Medications as of 04/28/2023  Medication Sig   acetaminophen (TYLENOL) 500 MG tablet Take 500 mg by mouth every morning.   albuterol (VENTOLIN HFA) 108 (90 Base) MCG/ACT inhaler Inhale 2 puffs into the lungs every 6 (six) hours as needed for wheezing or shortness of breath.   Amino Acids-Protein Hydrolys (FEEDING SUPPLEMENT, PRO-STAT 64,) LIQD Take 30 mLs by mouth daily.   apixaban (ELIQUIS) 5 MG TABS tablet Take 2 tablets (10 mg total) by mouth 2 (two) times daily for 5 days, THEN 1 tablet (5 mg total) 2 (two) times daily.   atorvastatin (LIPITOR) 40 MG tablet TAKE 1 TABLET ONCE DAILY.   buPROPion (WELLBUTRIN XL) 150 MG 24 hr tablet Take 1 tablet (150 mg total) by mouth daily.    Cholecalciferol 1.25 MG (50000 UT) capsule Take 50,000 Units by mouth every Friday.   diclofenac Sodium (VOLTAREN) 1 % GEL Apply 2 g topically 4 (four) times daily as needed (arthritis pain).   donepezil (ARICEPT) 10 MG tablet TAKE ONE TABLET BY MOUTH AT BEDTIME   famotidine (PEPCID) 20 MG tablet Take 20 mg by mouth every morning.   fexofenadine (ALLEGRA) 180 MG tablet Take 1 tablet (180 mg total) by mouth daily.   Fluticasone-Umeclidin-Vilant (TRELEGY ELLIPTA) 200-62.5-25 MCG/ACT AEPB Inhale 1 puff into the lungs daily.   furosemide (LASIX) 40 MG tablet Take 1 tablet (40 mg total) by mouth every Monday, Wednesday, and Friday.   guaiFENesin (MUCINEX) 600 MG 12 hr tablet Take 1 tablet (600 mg total) by mouth 2 (two) times daily.   levalbuterol (XOPENEX) 0.31 MG/3ML nebulizer solution Take 3 mLs (0.31 mg total) by nebulization 2 (two) times daily for 5 days. And q 6 prn   memantine (NAMENDA) 10 MG tablet TAKE 1 TABLET BY MOUTH TWICE DAILY.   metoprolol succinate (TOPROL-XL) 50 MG 24 hr tablet Take 1 tablet (50 mg total) by mouth daily. Take with or immediately following a  meal.   oxymetazoline (AFRIN NASAL SPRAY) 0.05 % nasal spray Place 2 sprays into both nostrils daily as needed for congestion. Prn for epistaxis   polyethylene glycol (MIRALAX / GLYCOLAX) 17 g packet Take 17 g by mouth daily.   potassium chloride SA (KLOR-CON M) 20 MEQ tablet Take 1 tablet (20 mEq total) by mouth every Monday, Wednesday, and Friday for 1 dose.   saline (AYR) GEL Place 1 Application into both nostrils 2 (two) times daily as needed.   sodium chloride (OCEAN) 0.65 % SOLN nasal spray Place 1 spray into both nostrils as needed for congestion.   vitamin B-12 (CYANOCOBALAMIN) 1000 MCG tablet Take 1 tablet (1,000 mcg total) by mouth daily.   No facility-administered encounter medications on file as of 04/28/2023.    Review of Systems  Unable to perform ROS: Dementia    Immunization History  Administered Date(s)  Administered   Fluad Quad(high Dose 65+) 10/13/2021   Fluad Trivalent(High Dose 65+) 10/14/2022   Influenza, High Dose Seasonal PF 10/20/2018, 11/09/2019   Influenza,inj,Quad PF,6+ Mos 11/04/2017   Influenza-Unspecified 10/25/2013, 10/31/2014, 11/06/2015, 11/01/2016, 10/31/2020   Moderna Covid-19 Vaccine Bivalent Booster 2yrs & up 10/24/2020   Moderna SARS-COV2 Booster Vaccination 11/27/2019   Moderna Sars-Covid-2 Vaccination 01/23/2019, 02/20/2019, 06/15/2021, 11/11/2021   Pneumococcal Conjugate-13 06/04/2014   Pneumococcal Polysaccharide-23 01/12/2000   Tdap 07/19/2017   Unspecified SARS-COV-2 Vaccination 10/14/2022   Zoster Recombinant(Shingrix) 05/21/2021, 09/16/2021   Pertinent  Health Maintenance Due  Topic Date Due   OPHTHALMOLOGY EXAM  07/11/2020   FOOT EXAM  05/04/2023   HEMOGLOBIN A1C  07/18/2023   INFLUENZA VACCINE  08/12/2023   DEXA SCAN  Completed      12/29/2021    9:22 AM 05/04/2022   10:24 AM 05/14/2022   12:59 PM 08/31/2022   10:42 AM 01/25/2023    3:51 PM  Fall Risk  Falls in the past year? 0 0 0 0 1  Was there an injury with Fall? 0 0 0 0 0  Fall Risk Category Calculator 0 0 0 0 1  Fall Risk Category (Retired) Low      (RETIRED) Patient Fall Risk Level Low fall risk      Patient at Risk for Falls Due to No Fall Risks Impaired balance/gait;Impaired mobility Impaired balance/gait;Impaired mobility No Fall Risks History of fall(s);Impaired balance/gait  Fall risk Follow up Falls evaluation completed Falls evaluation completed  Falls evaluation completed Falls evaluation completed;Education provided   Functional Status Survey:    Vitals:   04/28/23 1007  BP: (!) 156/84  Pulse: 72  Resp: 17  Temp: 97.8 F (36.6 C)  SpO2: 93%  Weight: 152 lb 6.4 oz (69.1 kg)  Height: 5\' 8"  (1.727 m)   Body mass index is 23.17 kg/m. Physical Exam Vitals reviewed.  Constitutional:      General: She is not in acute distress. HENT:     Head: Normocephalic.     Right  Ear: There is no impacted cerumen.     Left Ear: There is no impacted cerumen.     Nose: Nose normal.     Mouth/Throat:     Mouth: Mucous membranes are moist.  Eyes:     General:        Right eye: No discharge.        Left eye: No discharge.  Cardiovascular:     Rate and Rhythm: Normal rate and regular rhythm.     Pulses: Normal pulses.     Heart sounds: Normal heart  sounds.  Pulmonary:     Effort: Pulmonary effort is normal. No respiratory distress.     Breath sounds: Normal breath sounds. No wheezing or rales.  Abdominal:     General: Bowel sounds are normal. There is no distension.     Palpations: Abdomen is soft.     Tenderness: There is no abdominal tenderness.  Musculoskeletal:     Cervical back: Neck supple.     Right lower leg: Edema present.     Left lower leg: Edema present.     Comments: Non pitting  Skin:    General: Skin is warm.     Capillary Refill: Capillary refill takes less than 2 seconds.  Neurological:     General: No focal deficit present.     Mental Status: She is alert. Mental status is at baseline.     Motor: Weakness present.     Gait: Gait abnormal.  Psychiatric:        Mood and Affect: Mood normal.     Comments: Very pleasant, alert to self/person, follows commands, able to express needs     Labs reviewed: Recent Labs    01/20/23 0843 01/21/23 0259 02/06/23 2351 02/08/23 0902 02/09/23 0627 02/15/23 0000  NA  --    < > 137 138 136 139  K  --    < > 4.1 4.0 4.1 4.0  CL  --    < > 105 106 105 107  CO2  --    < > 19* 23 19* 21  GLUCOSE  --    < > 151* 155* 191*  --   BUN  --    < > 55* 51* 49* 27*  CREATININE  --    < > 1.56* 1.48* 1.37* 1.1  CALCIUM  --    < > 8.5* 8.6* 8.7* 8.5*  MG 2.1  --   --  2.4  --   --    < > = values in this interval not displayed.   Recent Labs    01/18/23 0000 01/20/23 0154 02/08/23 0902  AST 22 19 17   ALT 16 15 24   ALKPHOS 108 82 88  BILITOT  --  1.5* 0.7  PROT  --  6.8 6.1*  ALBUMIN 3.8 3.4*  2.9*   Recent Labs    01/20/23 0154 01/20/23 0526 02/06/23 2351 02/08/23 0902 02/09/23 0627  WBC 13.6*   < > 13.4* 10.9* 10.6*  NEUTROABS 11.2*  --   --  7.8*  --   HGB 15.6*   < > 14.2 14.2 13.5  HCT 48.7*   < > 46.2* 44.5 42.4  MCV 95.9   < > 98.3 94.3 94.2  PLT 128*   < > 164 158 177   < > = values in this interval not displayed.   Lab Results  Component Value Date   TSH 1.29 01/18/2023   Lab Results  Component Value Date   HGBA1C 6.8 01/18/2023   Lab Results  Component Value Date   CHOL 146 01/18/2023   HDL 56 01/18/2023   LDLCALC 65 01/18/2023   TRIG 126 01/18/2023    Significant Diagnostic Results in last 30 days:  No results found.  Assessment/Plan 1. Pulmonary emphysema, unspecified emphysema type (HCC) (Primary) - followed by pulmonary  - cont Trelegy, mucinex, levalbuterol prn  2. H/O pulmonary fibrosis - see above - CT chest recommended in 6 months - off continuous oxygen - cont nocturnal oxygen  3. Acute deep vein thrombosis (  DVT) of femoral vein of left lower extremity (HCC) - on Eliquis - cont compression hose and leg elevation  4. Chronic diastolic CHF (congestive heart failure) (HCC) - LVEF 60-65% - BNP was 1420> improved to 125.6 - cont furosemide/KCL  5. Reactive depression - 03/11 started on Wellbutrin due to lack of motivation - weight stable - unable to take SSRI due to hyponatremia - improved mood today  6. Moderate dementia, unspecified dementia type, unspecified whether behavioral, psychotic, or mood disturbance or anxiety (HCC) - MMSE 22/30 - dependent with ADLs except feeding - cont Aricept and Namenda  7. Type 2 diabetes mellitus with stage 3 chronic kidney disease, without long-term current use of insulin, unspecified whether stage 3a or 3b CKD (HCC) - last A1c 6.8 - need eye exam - diet controlled  8. Essential hypertension - controlled with metoprolol  9. Hypercholesterolemia - LDL stable with  atorvastatin  10. Pressure injury of sacral region, stage 2 (HCC) - slow healing - followed by dietary> on prostat - cont dressing changes QOD    Family/ staff Communication: plan discussed with patient and nurse  Labs/tests ordered:  none

## 2023-05-12 DIAGNOSIS — E08319 Diabetes mellitus due to underlying condition with unspecified diabetic retinopathy without macular edema: Secondary | ICD-10-CM | POA: Diagnosis not present

## 2023-05-12 DIAGNOSIS — D649 Anemia, unspecified: Secondary | ICD-10-CM | POA: Diagnosis not present

## 2023-05-12 LAB — BASIC METABOLIC PANEL WITH GFR
BUN: 42 — AB (ref 4–21)
CO2: 22 (ref 13–22)
Chloride: 104 (ref 99–108)
Creatinine: 1.2 — AB (ref 0.5–1.1)
Glucose: 110
Potassium: 4.3 meq/L (ref 3.5–5.1)
Sodium: 138 (ref 137–147)

## 2023-05-12 LAB — COMPREHENSIVE METABOLIC PANEL WITH GFR: Calcium: 9.2 (ref 8.7–10.7)

## 2023-05-16 ENCOUNTER — Encounter: Payer: Self-pay | Admitting: Pulmonary Disease

## 2023-05-16 ENCOUNTER — Encounter: Payer: Self-pay | Admitting: Internal Medicine

## 2023-05-17 ENCOUNTER — Encounter: Payer: Medicare Other | Admitting: Internal Medicine

## 2023-05-17 LAB — HEMOGLOBIN A1C: Hemoglobin A1C: 6.3

## 2023-05-17 NOTE — Telephone Encounter (Signed)
 Hi Kelsey Salinas  It is reasonable to hold off on CT scan if she is doing well.  We had been following her also for mild interstitial lung disease and pulmonary fibrosis which has remained stable.  I do not think we will be treating her with antifibrotics at her age as they have significant side effect profile so I think it is reasonable to treat conservatively.  Kelsey Salinas

## 2023-05-18 ENCOUNTER — Non-Acute Institutional Stay (SKILLED_NURSING_FACILITY): Payer: Self-pay | Admitting: Internal Medicine

## 2023-05-18 ENCOUNTER — Encounter: Payer: Self-pay | Admitting: Internal Medicine

## 2023-05-18 DIAGNOSIS — I5032 Chronic diastolic (congestive) heart failure: Secondary | ICD-10-CM | POA: Diagnosis not present

## 2023-05-18 DIAGNOSIS — L89152 Pressure ulcer of sacral region, stage 2: Secondary | ICD-10-CM | POA: Diagnosis not present

## 2023-05-18 DIAGNOSIS — F03B0 Unspecified dementia, moderate, without behavioral disturbance, psychotic disturbance, mood disturbance, and anxiety: Secondary | ICD-10-CM | POA: Diagnosis not present

## 2023-05-18 DIAGNOSIS — F329 Major depressive disorder, single episode, unspecified: Secondary | ICD-10-CM | POA: Diagnosis not present

## 2023-05-18 DIAGNOSIS — N183 Chronic kidney disease, stage 3 unspecified: Secondary | ICD-10-CM

## 2023-05-18 DIAGNOSIS — Z8709 Personal history of other diseases of the respiratory system: Secondary | ICD-10-CM | POA: Diagnosis not present

## 2023-05-18 DIAGNOSIS — I82412 Acute embolism and thrombosis of left femoral vein: Secondary | ICD-10-CM

## 2023-05-18 DIAGNOSIS — I10 Essential (primary) hypertension: Secondary | ICD-10-CM | POA: Diagnosis not present

## 2023-05-18 DIAGNOSIS — E1122 Type 2 diabetes mellitus with diabetic chronic kidney disease: Secondary | ICD-10-CM | POA: Diagnosis not present

## 2023-05-18 DIAGNOSIS — E782 Mixed hyperlipidemia: Secondary | ICD-10-CM

## 2023-05-18 NOTE — Progress Notes (Signed)
 Location:  [facility] Room Number: 108/A Place of Service:  SNF [phone]) Provider:  Marguerite Shiley, MD  Patient Care Team: Marguerite Shiley, MD as PCP - General (Internal Medicine) Sheryle Donning, MD as PCP - Cardiology (Cardiology) Liliane Rei, MD as Consulting Physician (Orthopedic Surgery) Arty Binning, MD (Inactive) as Consulting Physician (Cardiology) Amedeo Jupiter, MD as Consulting Physician (Ophthalmology) Elly Habermann, MD as Consulting Physician (Orthopedic Surgery)  Extended Emergency Contact Information Primary Emergency Contact: Schnackenberg,Donna  [place] Home Phone: [phone] Mobile Phone: [phone] Relation: Daughter Secondary Emergency Contact: Nooney,Arlene Home Phone: [phone] Mobile Phone: [phone] Relation: Niece  Code Status:  Full Code Goals of care: Advanced Directive information    05/18/2023    9:32 AM  Advanced Directives  Does Patient Have a Medical Advance Directive? Yes  Type of Estate agent of [place];Living will  Does patient want to make changes to medical advance directive? No - Patient declined  Copy of Healthcare Power of Attorney in Chart? Yes - validated most recent copy scanned in chart (See row information)     Chief Complaint  Patient presents with   Medical Management of Chronic Issues    Routine Visit. In addition needs to discuss eye exam and Medicare Annual Wellness Visit.    HPI:  Pt is a 89 y.o. female seen today for medical management of chronic diseases.    Patient is now in SNF   H/o Acute DVT Now on Eliquis  Possible lower dose after 6 months that would be August  Alzheimer dementia MMSE 22/30 Patient is not in SNF as she needs help with transfers 2 assist Also has 24/7 Caregivers On Aricept  and Namenda   ILD Mild No Meds/no oxygen  either Depression doing well on Wellbutrin   Still has Pressure ulcer in sacral  area per Wound care Nurse Almost healed needing dressing changes QOD Wt Readings from Last 3 Encounters:  05/18/23 151 lb 6.4 oz (68.7 kg)  04/28/23 152 lb 6.4 oz (69.1 kg)  04/15/23 150 lb 6.4 oz (68.2 kg)      Past Medical History:  Diagnosis Date   Abscess of right leg 12/24/2020   Childhood asthma    Chronic diastolic heart failure (HCC) 12/25/2012   October 2013 echocardiogram 1. There is moderate concentric left ventricle hypertrophy. 2. Left ventricular ejection fraction estimated by 2D at 60-65 percent. 3. There were no regional wall motion abnormalities. 4. Mild mitral annular calcification. 5. Mild mitral valve regurgitation. 6. There is mild tricuspid regurgitation. 7. Mildly elevated estimated right ventricular systolic pressure. 8. Right ventricular systolic pressure estimated at 35-40 mm Hg. 9. Mild calcification of the aortic valve. 10. Trace aortic valve regurgitation. 11. Analysis of mitral valve inflow, pulmonary vein Doppler and tissue Doppler suggests grade I diastolic dysfunction without elevated left atrial pressure.    Colon polyp    Diverticular stricture (HCC) 2009   Diverticulosis 10/30/2007   DM (diabetes mellitus), type 2 (HCC)    Edema 09/12/2013   Fall 05/18/2017   tripped over wheel of walker; superior and inferior pubic ramus fractures    Hypercholesterolemia    Hypertension    Hypertension, essential    Hypothyroidism    as a teenager   Indigestion    Insomnia    OA (osteoarthritis) of hip    right hip replacement, OA in bilateral knees   Osteopenia    Actonel stopped in 2012 after 5years therapy   Pneumonia 1939   "hospitalized"  Rapid resting heart rate 12/25/2012   Uncertain cause. Possible ectopic atrial tachycardia. Date entered 12/25/12    Right rotator cuff tear    Seasonal allergies    Unsteadiness on feet    Past Surgical History:  Procedure Laterality Date   CATARACT EXTRACTION W/ INTRAOCULAR LENS  IMPLANT, BILATERAL Bilateral     COLONOSCOPY  2009   Dr. Delberta Fee   DILATION AND CURETTAGE OF UTERUS     FRACTURE SURGERY     JOINT REPLACEMENT     ORIF WRIST FRACTURE Right    REDUCTION MAMMAPLASTY  1999   TOTAL HIP ARTHROPLASTY Right 03-04-08   Dr. [place] Ina    Allergies  Allergen Reactions   Trimethoprim     Per matrix   Amlodipine Besylate Other (See Comments)    Edema   Lisinopril Cough    Cough    Sulfamethoxazole-Trimethoprim Other (See Comments)    Unknown reaction     Outpatient Encounter Medications as of 05/18/2023  Medication Sig   acetaminophen  (TYLENOL ) 500 MG tablet Take 500 mg by mouth every morning.   albuterol  (VENTOLIN  HFA) 108 (90 Base) MCG/ACT inhaler Inhale 2 puffs into the lungs every 6 (six) hours as needed for wheezing or shortness of breath.   Amino Acids-Protein Hydrolys (FEEDING SUPPLEMENT, PRO-STAT 64,) LIQD Take 30 mLs by mouth daily.   apixaban  (ELIQUIS ) 5 MG TABS tablet Take 2 tablets (10 mg total) by mouth 2 (two) times daily for 5 days, THEN 1 tablet (5 mg total) 2 (two) times daily.   atorvastatin  (LIPITOR) 40 MG tablet TAKE 1 TABLET ONCE DAILY.   buPROPion  (WELLBUTRIN  XL) 150 MG 24 hr tablet Take 1 tablet (150 mg total) by mouth daily.   Cholecalciferol 1.25 MG (50000 UT) capsule Take 50,000 Units by mouth every Friday.   diclofenac  Sodium (VOLTAREN ) 1 % GEL Apply 2 g topically 4 (four) times daily as needed (arthritis pain).   donepezil  (ARICEPT ) 10 MG tablet TAKE ONE TABLET BY MOUTH AT BEDTIME   famotidine  (PEPCID ) 20 MG tablet Take 20 mg by mouth every morning.   fexofenadine  (ALLEGRA ) 180 MG tablet Take 1 tablet (180 mg total) by mouth daily.   Fluticasone -Umeclidin-Vilant (TRELEGY ELLIPTA ) 200-62.5-25 MCG/ACT AEPB Inhale 1 puff into the lungs daily.   furosemide  (LASIX ) 40 MG tablet Take 1 tablet (40 mg total) by mouth every Monday, Wednesday, and Friday.   guaiFENesin  (MUCINEX ) 600 MG 12 hr tablet Take 1 tablet (600 mg total) by mouth 2 (two) times daily.   levalbuterol   (XOPENEX ) 0.31 MG/3ML nebulizer solution Take 3 mLs (0.31 mg total) by nebulization 2 (two) times daily for 5 days. And q 6 prn   memantine  (NAMENDA ) 10 MG tablet TAKE 1 TABLET BY MOUTH TWICE DAILY.   metoprolol  succinate (TOPROL -XL) 50 MG 24 hr tablet Take 1 tablet (50 mg total) by mouth daily. Take with or immediately following a meal.   oxymetazoline  (AFRIN NASAL SPRAY) 0.05 % nasal spray Place 2 sprays into both nostrils daily as needed for congestion. Prn for epistaxis   polyethylene glycol (MIRALAX  / GLYCOLAX ) 17 g packet Take 17 g by mouth daily.   potassium chloride  SA (KLOR-CON  M) 20 MEQ tablet Take 1 tablet (20 mEq total) by mouth every Monday, Wednesday, and Friday for 1 dose.   saline (AYR) GEL Place 1 Application into both nostrils 2 (two) times daily as needed.   sodium chloride  (OCEAN) 0.65 % SOLN nasal spray Place 1 spray into both nostrils as needed for congestion.  vitamin B-12 (CYANOCOBALAMIN ) 1000 MCG tablet Take 1 tablet (1,000 mcg total) by mouth daily.   No facility-administered encounter medications on file as of 05/18/2023.    Review of Systems  Constitutional:  Negative for activity change and appetite change.  HENT: Negative.    Respiratory:  Negative for cough and shortness of breath.   Cardiovascular:  Positive for leg swelling.  Gastrointestinal:  Negative for constipation.  Genitourinary: Negative.   Musculoskeletal:  Positive for gait problem. Negative for arthralgias and myalgias.  Skin: Negative.   Neurological:  Negative for dizziness and weakness.  Psychiatric/Behavioral:  Positive for confusion. Negative for dysphoric mood and sleep disturbance.     Immunization History  Administered Date(s) Administered   Fluad Quad(high Dose 65+) 10/13/2021   Fluad Trivalent(High Dose 65+) 10/14/2022   Influenza, High Dose Seasonal PF 10/20/2018, 11/09/2019   Influenza,inj,Quad PF,6+ Mos 11/04/2017   Influenza-Unspecified 10/25/2013, 10/31/2014, 11/06/2015,  11/01/2016, 10/31/2020   Moderna Covid-19 Vaccine  Bivalent Booster 52yrs & up 10/24/2020   Moderna SARS-COV2 Booster Vaccination 11/27/2019   Moderna Sars-Covid-2 Vaccination 01/23/2019, 02/20/2019, 06/15/2021, 11/11/2021   Pneumococcal Conjugate-13 06/04/2014   Pneumococcal Polysaccharide-23 01/12/2000   Tdap 07/19/2017   Unspecified SARS-COV-2 Vaccination 10/14/2022   Zoster Recombinant(Shingrix) 05/21/2021, 09/16/2021   Pertinent  Health Maintenance Due  Topic Date Due   OPHTHALMOLOGY EXAM  07/11/2020   HEMOGLOBIN A1C  07/18/2023   INFLUENZA VACCINE  08/12/2023   FOOT EXAM  04/27/2024   DEXA SCAN  Completed      05/04/2022   10:24 AM 05/14/2022   12:59 PM 08/31/2022   10:42 AM 01/25/2023    3:51 PM 04/28/2023   10:48 AM  Fall Risk  Falls in the past year? 0 0 0 1 1  Was there an injury with Fall? 0 0 0 0 0  Fall Risk Category Calculator 0 0 0 1 1  Patient at Risk for Falls Due to Impaired balance/gait;Impaired mobility Impaired balance/gait;Impaired mobility No Fall Risks History of fall(s);Impaired balance/gait History of fall(s)  Fall risk Follow up Falls evaluation completed  Falls evaluation completed Falls evaluation completed;Education provided Falls evaluation completed   Functional Status Survey:    Vitals:   05/18/23 0929  BP: (!) 143/85  Pulse: 66  Resp: 19  Temp: (!) 97 F (36.1 C)  SpO2: 95%  Weight: 151 lb 6.4 oz (68.7 kg)  Height: 5\' 8"  (1.727 m)   Body mass index is 23.02 kg/m. Physical Exam Vitals reviewed.  Constitutional:      Appearance: Normal appearance.  HENT:     Head: Normocephalic.     Nose: Nose normal.     Mouth/Throat:     Mouth: Mucous membranes are moist.     Pharynx: Oropharynx is clear.  Eyes:     Pupils: Pupils are equal, round, and reactive to light.  Cardiovascular:     Rate and Rhythm: Normal rate and regular rhythm.     Pulses: Normal pulses.     Heart sounds: Normal heart sounds. No murmur heard. Pulmonary:      Effort: Pulmonary effort is normal.     Breath sounds: Normal breath sounds.  Abdominal:     General: Abdomen is flat. Bowel sounds are normal.     Palpations: Abdomen is soft.  Musculoskeletal:        General: No swelling.     Cervical back: Neck supple.     Comments: Mild edema bilateral  Skin:    General: Skin is warm.  Neurological:  General: No focal deficit present.     Mental Status: She is alert.  Psychiatric:        Mood and Affect: Mood normal.        Thought Content: Thought content normal.     Labs reviewed: Recent Labs    01/20/23 0843 01/21/23 0259 02/06/23 2351 02/08/23 0902 02/09/23 0627 02/15/23 0000  NA  --    < > 137 138 136 139  K  --    < > 4.1 4.0 4.1 4.0  CL  --    < > 105 106 105 107  CO2  --    < > 19* 23 19* 21  GLUCOSE  --    < > 151* 155* 191*  --   BUN  --    < > 55* 51* 49* 27*  CREATININE  --    < > 1.56* 1.48* 1.37* 1.1  CALCIUM   --    < > 8.5* 8.6* 8.7* 8.5*  MG 2.1  --   --  2.4  --   --    < > = values in this interval not displayed.   Recent Labs    01/18/23 0000 01/20/23 0154 02/08/23 0902  AST 22 19 17   ALT 16 15 24   ALKPHOS 108 82 88  BILITOT  --  1.5* 0.7  PROT  --  6.8 6.1*  ALBUMIN 3.8 3.4* 2.9*   Recent Labs    01/20/23 0154 01/20/23 0526 02/06/23 2351 02/08/23 0902 02/09/23 0627  WBC 13.6*   < > 13.4* 10.9* 10.6*  NEUTROABS 11.2*  --   --  7.8*  --   HGB 15.6*   < > 14.2 14.2 13.5  HCT 48.7*   < > 46.2* 44.5 42.4  MCV 95.9   < > 98.3 94.3 94.2  PLT 128*   < > 164 158 177   < > = values in this interval not displayed.   Lab Results  Component Value Date   TSH 1.29 01/18/2023   Lab Results  Component Value Date   HGBA1C 6.8 01/18/2023   Lab Results  Component Value Date   CHOL 146 01/18/2023   HDL 56 01/18/2023   LDLCALC 65 01/18/2023   TRIG 126 01/18/2023    Significant Diagnostic Results in last 30 days:  No results found.  Assessment/Plan 1. Acute deep vein thrombosis (DVT) of  femoral vein of left lower extremity (HCC) (Primary) On Eliquis  Consider dose change after 6 months  2. H/O ILD Off Oxygen  now On Trelegy Xopenox PRN 3. Chronic diastolic CHF (congestive heart failure) (HCC) Lasix   4. Reactive depression Doing well on Wellbutrin   5. Moderate dementia,  Aricept  and Namenda  Has caregivers  6. Type 2 diabetes mellitus with stage 3 chronic kidney disease, No Meds A1C 6.8 7. Essential hypertension Toprol   8. Mixed hyperlipidemia statin  9. Pressure injury of sacral region, stage 2 [facility]) Almost healed per Nurses    Family/ staff Communication:   Labs/tests ordered:

## 2023-05-19 DIAGNOSIS — I5032 Chronic diastolic (congestive) heart failure: Secondary | ICD-10-CM | POA: Diagnosis not present

## 2023-05-19 LAB — BASIC METABOLIC PANEL WITH GFR
BUN: 35 — AB (ref 4–21)
CO2: 22 (ref 13–22)
Chloride: 105 (ref 99–108)
Creatinine: 1.2 — AB (ref 0.5–1.1)
Glucose: 108
Potassium: 4.1 meq/L (ref 3.5–5.1)
Sodium: 139 (ref 137–147)

## 2023-05-19 LAB — COMPREHENSIVE METABOLIC PANEL WITH GFR: Calcium: 9 (ref 8.7–10.7)

## 2023-05-20 DIAGNOSIS — M810 Age-related osteoporosis without current pathological fracture: Secondary | ICD-10-CM | POA: Diagnosis not present

## 2023-05-20 DIAGNOSIS — R278 Other lack of coordination: Secondary | ICD-10-CM | POA: Diagnosis not present

## 2023-05-24 DIAGNOSIS — R278 Other lack of coordination: Secondary | ICD-10-CM | POA: Diagnosis not present

## 2023-05-24 DIAGNOSIS — M810 Age-related osteoporosis without current pathological fracture: Secondary | ICD-10-CM | POA: Diagnosis not present

## 2023-05-26 DIAGNOSIS — M810 Age-related osteoporosis without current pathological fracture: Secondary | ICD-10-CM | POA: Diagnosis not present

## 2023-05-26 DIAGNOSIS — R278 Other lack of coordination: Secondary | ICD-10-CM | POA: Diagnosis not present

## 2023-06-23 ENCOUNTER — Non-Acute Institutional Stay (SKILLED_NURSING_FACILITY): Payer: Self-pay | Admitting: Adult Health

## 2023-06-23 DIAGNOSIS — I10 Essential (primary) hypertension: Secondary | ICD-10-CM

## 2023-06-23 DIAGNOSIS — I824Y2 Acute embolism and thrombosis of unspecified deep veins of left proximal lower extremity: Secondary | ICD-10-CM | POA: Diagnosis not present

## 2023-06-23 DIAGNOSIS — R000 Tachycardia, unspecified: Secondary | ICD-10-CM

## 2023-06-23 DIAGNOSIS — J849 Interstitial pulmonary disease, unspecified: Secondary | ICD-10-CM

## 2023-06-23 DIAGNOSIS — I5032 Chronic diastolic (congestive) heart failure: Secondary | ICD-10-CM

## 2023-06-23 DIAGNOSIS — N1832 Chronic kidney disease, stage 3b: Secondary | ICD-10-CM | POA: Diagnosis not present

## 2023-06-23 DIAGNOSIS — E1122 Type 2 diabetes mellitus with diabetic chronic kidney disease: Secondary | ICD-10-CM | POA: Diagnosis not present

## 2023-06-23 DIAGNOSIS — F329 Major depressive disorder, single episode, unspecified: Secondary | ICD-10-CM

## 2023-06-23 DIAGNOSIS — F0390 Unspecified dementia without behavioral disturbance: Secondary | ICD-10-CM

## 2023-06-24 ENCOUNTER — Encounter: Payer: Self-pay | Admitting: Adult Health

## 2023-06-24 DIAGNOSIS — F329 Major depressive disorder, single episode, unspecified: Secondary | ICD-10-CM | POA: Insufficient documentation

## 2023-06-24 DIAGNOSIS — J849 Interstitial pulmonary disease, unspecified: Secondary | ICD-10-CM | POA: Insufficient documentation

## 2023-06-24 NOTE — Assessment & Plan Note (Signed)
 Found Jan of 2025 On Eliquis  indefinitely Continue compression hose

## 2023-06-24 NOTE — Assessment & Plan Note (Signed)
 Controlled.

## 2023-06-24 NOTE — Assessment & Plan Note (Addendum)
 Has a chronic cough Followed by pulmonary  On trelegy 93% RA

## 2023-06-24 NOTE — Assessment & Plan Note (Signed)
 Improved wellbutrin  Adjusting to skilled care.

## 2023-06-24 NOTE — Assessment & Plan Note (Signed)
 Resides in skilled care On aricept  and namenda .

## 2023-06-24 NOTE — Assessment & Plan Note (Signed)
 HR controlled on metoprolol 

## 2023-06-24 NOTE — Assessment & Plan Note (Signed)
 Compensated Followed by cardiology Lasix  M W F

## 2023-06-24 NOTE — Assessment & Plan Note (Signed)
 A1C is at goal Diet controlled Annual eye exam recommended

## 2023-06-24 NOTE — Progress Notes (Signed)
 Location:  Medical illustrator of Service:  SNF (31) Provider:   Janace Mckusick, ANP [facility] [phone]   Marguerite Shiley, MD  Patient Care Team: Marguerite Shiley, MD as PCP - General (Internal Medicine) Sheryle Donning, MD as PCP - Cardiology (Cardiology) Liliane Rei, MD as Consulting Physician (Orthopedic Surgery) Arty Binning, MD (Inactive) as Consulting Physician (Cardiology) Amedeo Jupiter, MD as Consulting Physician (Ophthalmology) Elly Habermann, MD as Consulting Physician (Orthopedic Surgery)  Extended Emergency Contact Information Primary Emergency Contact: Oralia Bills  [place] Home Phone: [phone] Mobile Phone: [phone] Relation: Daughter Secondary Emergency Contact: Ollis,Arlene Home Phone: [phone] Mobile Phone: [phone] Relation: Niece  Code Status:  Full code  Goals of care: Advanced Directive information    05/18/2023    9:32 AM  Advanced Directives  Does Patient Have a Medical Advance Directive? Yes  Type of Estate agent of [place];Living will  Does patient want to make changes to medical advance directive? No - Patient declined  Copy of Healthcare Power of Attorney in Chart? Yes - validated most recent copy scanned in chart (See row information)     Chief Complaint  Patient presents with   Medical Management of Chronic Issues    HPI:  Pt is a 89 y.o. female seen today for medical management of chronic diseases.    Resides in [facility] with 1:1 caretaker  She has a history of interstitial lung disease and emphysema found on high res CT and is followed by pulmonary. Has a chronic dry cough slightly more productive in the past few days per her caretaker. No feelings of sob. Sats have been normal on RA. Currently on trelegy.  Pulmonary decided to hold off f/u CT scan.  She is on Eliquis  for an acute deep vein thrombosis (DVT) found  in January 2025 in the left common femoral vein. She was seen by vascular surgery on February 17, 2023, and they recommended continuing Eliquis  indefinitely along with compression stockings. She is on lasix  three times weekly. Continues with chronic leg edema unchanged.  Wt Readings from Last 3 Encounters:  06/24/23 152 lb 9.6 oz (69.2 kg)  05/18/23 151 lb 6.4 oz (68.7 kg)  04/28/23 152 lb 6.4 oz (69.1 kg)    She was started on Wellbutrin  due to concern for depression 03/21/23.  Her mood seems improved over time. She still expresses that she wishes she could live in an apartment with her own kitchen.  Hx of type 2 DM diet controlled A1C 6.14 May 2023  Memory loss MMSE 22/30 on aricept  and namenda .  Needs a hoyer lift for transfers and assistance with ADLs.   HTN Controlled  CKD III May 2025 GFR 42 BUN 35.1 Cr 1.19   HLD on statin Lab Results  Component Value Date   LDLCALC 65 01/18/2023   Chronic diastolic CHF hx of tachycardia followed by cardiology Ef 60-65% diastolic dysfunction grade 1 severe LAE Rate controlled    Has pressure injury to buttocks that has almost healed. On prostat. Has a cushion for her chair.   Past Medical History:  Diagnosis Date   Abscess of right leg 12/24/2020   Childhood asthma    Chronic diastolic heart failure (HCC) 12/25/2012   October 2013 echocardiogram 1. There is moderate concentric left ventricle hypertrophy. 2. Left ventricular ejection fraction estimated by 2D at 60-65 percent. 3. There were no regional wall motion abnormalities. 4. Mild mitral annular calcification. 5. Mild mitral  valve regurgitation. 6. There is mild tricuspid regurgitation. 7. Mildly elevated estimated right ventricular systolic pressure. 8. Right ventricular systolic pressure estimated at 35-40 mm Hg. 9. Mild calcification of the aortic valve. 10. Trace aortic valve regurgitation. 11. Analysis of mitral valve inflow, pulmonary vein Doppler and tissue Doppler suggests grade I  diastolic dysfunction without elevated left atrial pressure.    Colon polyp    Diverticular stricture (HCC) 2009   Diverticulosis 10/30/2007   DM (diabetes mellitus), type 2 (HCC)    Edema 09/12/2013   Fall 05/18/2017   tripped over wheel of walker; superior and inferior pubic ramus fractures    Hypercholesterolemia    Hypertension    Hypertension, essential    Hypothyroidism    as a teenager   Indigestion    Insomnia    OA (osteoarthritis) of hip    right hip replacement, OA in bilateral knees   Osteopenia    Actonel stopped in 2012 after 5years therapy   Pneumonia 1939   hospitalized   Rapid resting heart rate 12/25/2012   Uncertain cause. Possible ectopic atrial tachycardia. Date entered 12/25/12    Right rotator cuff tear    Seasonal allergies    Unsteadiness on feet    Past Surgical History:  Procedure Laterality Date   CATARACT EXTRACTION W/ INTRAOCULAR LENS  IMPLANT, BILATERAL Bilateral    COLONOSCOPY  2009   Dr. Delberta Fee   DILATION AND CURETTAGE OF UTERUS     FRACTURE SURGERY     JOINT REPLACEMENT     ORIF WRIST FRACTURE Right    REDUCTION MAMMAPLASTY  1999   TOTAL HIP ARTHROPLASTY Right 03-04-08   Dr. France Ina    Allergies  Allergen Reactions   Trimethoprim     Per matrix   Amlodipine Besylate Other (See Comments)    Edema   Lisinopril Cough    Cough    Sulfamethoxazole-Trimethoprim Other (See Comments)    Unknown reaction     Outpatient Encounter Medications as of 06/23/2023  Medication Sig   acetaminophen  (TYLENOL ) 500 MG tablet Take 500 mg by mouth every morning.   albuterol  (VENTOLIN  HFA) 108 (90 Base) MCG/ACT inhaler Inhale 2 puffs into the lungs every 6 (six) hours as needed for wheezing or shortness of breath.   Amino Acids-Protein Hydrolys (FEEDING SUPPLEMENT, PRO-STAT 64,) LIQD Take 30 mLs by mouth daily.   apixaban  (ELIQUIS ) 5 MG TABS tablet Take 2 tablets (10 mg total) by mouth 2 (two) times daily for 5 days, THEN 1 tablet (5 mg total) 2  (two) times daily.   atorvastatin  (LIPITOR) 40 MG tablet TAKE 1 TABLET ONCE DAILY.   buPROPion  (WELLBUTRIN  XL) 150 MG 24 hr tablet Take 1 tablet (150 mg total) by mouth daily.   Cholecalciferol 1.25 MG (50000 UT) capsule Take 50,000 Units by mouth every Friday.   diclofenac  Sodium (VOLTAREN ) 1 % GEL Apply 2 g topically 4 (four) times daily as needed (arthritis pain).   donepezil  (ARICEPT ) 10 MG tablet TAKE ONE TABLET BY MOUTH AT BEDTIME   famotidine  (PEPCID ) 20 MG tablet Take 20 mg by mouth every morning.   fexofenadine  (ALLEGRA ) 180 MG tablet Take 1 tablet (180 mg total) by mouth daily.   Fluticasone -Umeclidin-Vilant (TRELEGY ELLIPTA ) 200-62.5-25 MCG/ACT AEPB Inhale 1 puff into the lungs daily.   furosemide  (LASIX ) 40 MG tablet Take 1 tablet (40 mg total) by mouth every Monday, Wednesday, and Friday.   guaiFENesin  (MUCINEX ) 600 MG 12 hr tablet Take 1 tablet (600 mg total) by mouth  2 (two) times daily.   levalbuterol  (XOPENEX ) 0.31 MG/3ML nebulizer solution Take 3 mLs (0.31 mg total) by nebulization 2 (two) times daily for 5 days. And q 6 prn   memantine  (NAMENDA ) 10 MG tablet TAKE 1 TABLET BY MOUTH TWICE DAILY.   metoprolol  succinate (TOPROL -XL) 50 MG 24 hr tablet Take 1 tablet (50 mg total) by mouth daily. Take with or immediately following a meal.   oxymetazoline  (AFRIN NASAL SPRAY) 0.05 % nasal spray Place 2 sprays into both nostrils daily as needed for congestion. Prn for epistaxis   polyethylene glycol (MIRALAX  / GLYCOLAX ) 17 g packet Take 17 g by mouth daily.   potassium chloride  SA (KLOR-CON  M) 20 MEQ tablet Take 1 tablet (20 mEq total) by mouth every Monday, Wednesday, and Friday for 1 dose.   saline (AYR) GEL Place 1 Application into both nostrils 2 (two) times daily as needed.   sodium chloride  (OCEAN) 0.65 % SOLN nasal spray Place 1 spray into both nostrils as needed for congestion.   vitamin B-12 (CYANOCOBALAMIN ) 1000 MCG tablet Take 1 tablet (1,000 mcg total) by mouth daily.   No  facility-administered encounter medications on file as of 06/23/2023.    Review of Systems  Constitutional:  Negative for activity change, appetite change, chills, diaphoresis, fatigue, fever and unexpected weight change.  HENT:  Negative for congestion.   Respiratory:  Positive for cough. Negative for shortness of breath and wheezing.   Cardiovascular:  Positive for leg swelling. Negative for chest pain and palpitations.  Gastrointestinal:  Negative for abdominal distention, abdominal pain, constipation and diarrhea.  Genitourinary:  Negative for difficulty urinating and dysuria.  Musculoskeletal:  Positive for gait problem. Negative for arthralgias, back pain, joint swelling and myalgias.  Neurological:  Positive for weakness (general). Negative for dizziness, tremors, seizures, syncope, facial asymmetry, speech difficulty, light-headedness, numbness and headaches.  Psychiatric/Behavioral:  Positive for dysphoric mood. Negative for agitation, behavioral problems and confusion.     Immunization History  Administered Date(s) Administered   Fluad  Quad(high Dose 65+) 10/13/2021   Fluad  Trivalent(High Dose 65+) 10/14/2022   Influenza, High Dose Seasonal PF 10/20/2018, 11/09/2019   Influenza,inj,Quad PF,6+ Mos 11/04/2017   Influenza-Unspecified 10/25/2013, 10/31/2014, 11/06/2015, 11/01/2016, 10/31/2020   Moderna Covid-19 Vaccine  Bivalent Booster 32yrs & up 10/24/2020   Moderna SARS-COV2 Booster Vaccination 11/27/2019   Moderna Sars-Covid-2 Vaccination 01/23/2019, 02/20/2019, 06/15/2021, 11/11/2021   Pneumococcal Conjugate-13 06/04/2014   Pneumococcal Polysaccharide-23 01/12/2000   Tdap 07/19/2017   Unspecified SARS-COV-2 Vaccination 10/14/2022   Zoster Recombinant(Shingrix) 05/21/2021, 09/16/2021   Pertinent  Health Maintenance Due  Topic Date Due   OPHTHALMOLOGY EXAM  07/11/2020   HEMOGLOBIN A1C  07/18/2023   INFLUENZA VACCINE  08/12/2023   FOOT EXAM  04/27/2024   DEXA SCAN   Completed      05/04/2022   10:24 AM 05/14/2022   12:59 PM 08/31/2022   10:42 AM 01/25/2023    3:51 PM 04/28/2023   10:48 AM  Fall Risk  Falls in the past year? 0 0 0 1 1  Was there an injury with Fall? 0 0 0 0 0  Fall Risk Category Calculator 0 0 0 1 1  Patient at Risk for Falls Due to Impaired balance/gait;Impaired mobility Impaired balance/gait;Impaired mobility No Fall Risks History of fall(s);Impaired balance/gait History of fall(s)  Fall risk Follow up Falls evaluation completed  Falls evaluation completed Falls evaluation completed;Education provided Falls evaluation completed   Functional Status Survey:    Vitals:   06/24/23 0741  Weight: 152  lb 9.6 oz (69.2 kg)   Body mass index is 23.2 kg/m. Physical Exam Vitals and nursing note reviewed.  Constitutional:      General: She is not in acute distress.    Appearance: Normal appearance. She is not diaphoretic.  HENT:     Head: Normocephalic and atraumatic.     Mouth/Throat:     Mouth: Mucous membranes are moist.     Pharynx: Oropharynx is clear.  Neck:     Thyroid : No thyromegaly.     Vascular: No carotid bruit or JVD.   Cardiovascular:     Rate and Rhythm: Normal rate and regular rhythm.     Heart sounds: Normal heart sounds. No murmur heard. Pulmonary:     Effort: Pulmonary effort is normal. No respiratory distress.     Breath sounds: Normal breath sounds. No stridor.  Abdominal:     General: Bowel sounds are normal. There is no distension.     Palpations: Abdomen is soft.     Tenderness: There is no abdominal tenderness.   Musculoskeletal:     Cervical back: No rigidity. No muscular tenderness.     Right lower leg: Edema (+2) present.     Left lower leg: Edema (+2) present.  Lymphadenopathy:     Cervical: No cervical adenopathy.   Skin:    General: Skin is warm and dry.   Neurological:     Mental Status: She is alert. Mental status is at baseline.     Labs reviewed: Recent Labs    01/20/23 0843  01/21/23 0259 02/06/23 2351 02/08/23 0902 02/09/23 0627 02/15/23 0000  NA  --    < > 137 138 136 139  K  --    < > 4.1 4.0 4.1 4.0  CL  --    < > 105 106 105 107  CO2  --    < > 19* 23 19* 21  GLUCOSE  --    < > 151* 155* 191*  --   BUN  --    < > 55* 51* 49* 27*  CREATININE  --    < > 1.56* 1.48* 1.37* 1.1  CALCIUM   --    < > 8.5* 8.6* 8.7* 8.5*  MG 2.1  --   --  2.4  --   --    < > = values in this interval not displayed.   Recent Labs    01/18/23 0000 01/20/23 0154 02/08/23 0902  AST 22 19 17   ALT 16 15 24   ALKPHOS 108 82 88  BILITOT  --  1.5* 0.7  PROT  --  6.8 6.1*  ALBUMIN 3.8 3.4* 2.9*   Recent Labs    01/20/23 0154 01/20/23 0526 02/06/23 2351 02/08/23 0902 02/09/23 0627  WBC 13.6*   < > 13.4* 10.9* 10.6*  NEUTROABS 11.2*  --   --  7.8*  --   HGB 15.6*   < > 14.2 14.2 13.5  HCT 48.7*   < > 46.2* 44.5 42.4  MCV 95.9   < > 98.3 94.3 94.2  PLT 128*   < > 164 158 177   < > = values in this interval not displayed.   Lab Results  Component Value Date   TSH 1.29 01/18/2023   Lab Results  Component Value Date   HGBA1C 6.8 01/18/2023   Lab Results  Component Value Date   CHOL 146 01/18/2023   HDL 56 01/18/2023   LDLCALC 65 01/18/2023   TRIG 126  01/18/2023    Significant Diagnostic Results in last 30 days:  No results found.  Assessment/Plan  Type II diabetes mellitus with stage 3 chronic kidney disease (HCC) A1C is at goal Diet controlled Annual eye exam recommended   Sinus tachycardia HR controlled on metoprolol    Primary hypertension Controlled  DVT of proximal leg (deep vein thrombosis) (HCC) Found Jan of 2025 On Eliquis  indefinitely Continue compression hose  Dementia without behavioral disturbance (HCC) Resides in skilled care On aricept  and namenda .    Chronic diastolic heart failure (HCC) Compensated Followed by cardiology Lasix  M W F   CKD (chronic kidney disease) stage 3, GFR 30-59 ml/min (HCC) Continue to  periodically monitor BMP and avoid nephrotoxic agents   Interstitial lung disease (HCC) Has a chronic cough Followed by pulmonary  On trelegy 93% RA   Reactive depression Improved wellbutrin  Adjusting to skilled care.     Labs/tests ordered:  Will need CBC with next lab draw

## 2023-06-24 NOTE — Assessment & Plan Note (Signed)
Continue to periodically monitor BMP and avoid nephrotoxic agents  

## 2023-07-05 ENCOUNTER — Non-Acute Institutional Stay (SKILLED_NURSING_FACILITY): Payer: Self-pay | Admitting: Orthopedic Surgery

## 2023-07-05 ENCOUNTER — Encounter: Payer: Self-pay | Admitting: Orthopedic Surgery

## 2023-07-05 DIAGNOSIS — I5032 Chronic diastolic (congestive) heart failure: Secondary | ICD-10-CM | POA: Diagnosis not present

## 2023-07-05 DIAGNOSIS — F0390 Unspecified dementia without behavioral disturbance: Secondary | ICD-10-CM

## 2023-07-05 DIAGNOSIS — R051 Acute cough: Secondary | ICD-10-CM

## 2023-07-05 NOTE — Progress Notes (Unsigned)
 Location:  [facility] Room Number: 108/A Place of Service:  SNF [phone]) Provider:  Greig FORBES Cluster, NP   Kelsey Fredia CROME, MD  Patient Care Team: Kelsey Fredia CROME, MD as PCP - General (Internal Medicine) Kelsey Slain, MD as PCP - Cardiology (Cardiology) Kelsey Lerner, MD as Consulting Physician (Orthopedic Surgery) Kelsey Salinas ORN, MD (Inactive) as Consulting Physician (Cardiology) Kelsey Collar, MD as Consulting Physician (Ophthalmology) Kelsey Charleston, MD as Consulting Physician (Orthopedic Surgery)  Extended Emergency Contact Information Primary Emergency Contact: Kelsey Salinas  [place] Home Phone: [phone] Mobile Phone: [phone] Relation: Daughter Secondary Emergency Contact: Salinas,Kelsey Home Phone: [phone] Mobile Phone: [phone] Relation: Niece  Code Status:  DNR Goals of care: Advanced Directive information    05/18/2023    9:32 AM  Advanced Directives  Does Patient Have a Medical Advance Directive? Yes  Type of Estate agent of [place];Living will  Does patient want to make changes to medical advance directive? No - Patient declined  Copy of Healthcare Power of Attorney in Chart? Yes - validated most recent copy scanned in chart (See row information)     Chief Complaint  Patient presents with   Acute Visit    Dry cough    HPI:  Pt is a 89 y.o. female seen today for acute visit due to dry cough.   She currently resides on the skilled nursing unit at [facility]. PMH: CHF, tachycardia, HTN, HLD, T2DM, Alzheimer's dementia, OA, osteopenia, DDD, CKD, memory loss and vitamin D  deficiency.   H/o diastolic HF, interstitial lung disease, ans acute LLE DVT. Increased dry cough x 1 day. Poor historian due to dementia. She denies shortness of breath, chest pain or cold symptoms. No oxygen  use at this time. Nursing reports crackles on exam. Overall weight down 3 lbs from one  month ago. She is currently taking furosemide  40 mg 3x/week, Eliquis  and Trelegy. Afebrile. Vitals stable.        Past Medical History:  Diagnosis Date   Abscess of right leg 12/24/2020   Childhood asthma    Chronic diastolic heart failure (HCC) 12/25/2012   October 2013 echocardiogram 1. There is moderate concentric left ventricle hypertrophy. 2. Left ventricular ejection fraction estimated by 2D at 60-65 percent. 3. There were no regional wall motion abnormalities. 4. Mild mitral annular calcification. 5. Mild mitral valve regurgitation. 6. There is mild tricuspid regurgitation. 7. Mildly elevated estimated right ventricular systolic pressure. 8. Right ventricular systolic pressure estimated at 35-40 mm Hg. 9. Mild calcification of the aortic valve. 10. Trace aortic valve regurgitation. 11. Analysis of mitral valve inflow, pulmonary vein Doppler and tissue Doppler suggests grade I diastolic dysfunction without elevated left atrial pressure.    Colon polyp    Diverticular stricture (HCC) 2009   Diverticulosis 10/30/2007   DM (diabetes mellitus), type 2 (HCC)    Edema 09/12/2013   Fall 05/18/2017   tripped over wheel of walker; superior and inferior pubic ramus fractures    Hypercholesterolemia    Hypertension    Hypertension, essential    Hypothyroidism    as a teenager   Indigestion    Insomnia    OA (osteoarthritis) of hip    right hip replacement, OA in bilateral knees   Osteopenia    Actonel stopped in 2012 after 5years therapy   Pneumonia 1939   hospitalized   Rapid resting heart rate 12/25/2012   Uncertain cause. Possible ectopic atrial tachycardia. Date entered 12/25/12    Right rotator  cuff tear    Seasonal allergies    Unsteadiness on feet    Past Surgical History:  Procedure Laterality Date   CATARACT EXTRACTION W/ INTRAOCULAR LENS  IMPLANT, BILATERAL Bilateral    COLONOSCOPY  2009   Dr. Newell   DILATION AND CURETTAGE OF UTERUS     FRACTURE SURGERY      JOINT REPLACEMENT     ORIF WRIST FRACTURE Right    REDUCTION MAMMAPLASTY  1999   TOTAL HIP ARTHROPLASTY Right 03-04-08   Dr. Melodi    Allergies  Allergen Reactions   Trimethoprim     Per matrix   Amlodipine Besylate Other (See Comments)    Edema   Lisinopril Cough    Cough    Sulfamethoxazole-Trimethoprim Other (See Comments)    Unknown reaction     Outpatient Encounter Medications as of 07/05/2023  Medication Sig   acetaminophen  (TYLENOL ) 500 MG tablet Take 500 mg by mouth every morning.   albuterol  (VENTOLIN  HFA) 108 (90 Base) MCG/ACT inhaler Inhale 2 puffs into the lungs every 6 (six) hours as needed for wheezing or shortness of breath.   Amino Acids-Protein Hydrolys (FEEDING SUPPLEMENT, PRO-STAT 64,) LIQD Take 30 mLs by mouth daily.   apixaban  (ELIQUIS ) 5 MG TABS tablet Take 2 tablets (10 mg total) by mouth 2 (two) times daily for 5 days, THEN 1 tablet (5 mg total) 2 (two) times daily.   atorvastatin  (LIPITOR) 40 MG tablet TAKE 1 TABLET ONCE DAILY.   buPROPion  (WELLBUTRIN  XL) 150 MG 24 hr tablet Take 1 tablet (150 mg total) by mouth daily.   Cholecalciferol 1.25 MG (50000 UT) capsule Take 50,000 Units by mouth every Friday.   diclofenac  Sodium (VOLTAREN ) 1 % GEL Apply 2 g topically 4 (four) times daily as needed (arthritis pain).   donepezil  (ARICEPT ) 10 MG tablet TAKE ONE TABLET BY MOUTH AT BEDTIME   famotidine  (PEPCID ) 20 MG tablet Take 20 mg by mouth every morning.   fexofenadine  (ALLEGRA ) 180 MG tablet Take 1 tablet (180 mg total) by mouth daily.   Fluticasone -Umeclidin-Vilant (TRELEGY ELLIPTA ) 200-62.5-25 MCG/ACT AEPB Inhale 1 puff into the lungs daily.   furosemide  (LASIX ) 40 MG tablet Take 1 tablet (40 mg total) by mouth every Monday, Wednesday, and Friday.   guaiFENesin  (MUCINEX ) 600 MG 12 hr tablet Take 1 tablet (600 mg total) by mouth 2 (two) times daily.   memantine  (NAMENDA ) 10 MG tablet TAKE 1 TABLET BY MOUTH TWICE DAILY.   metoprolol  succinate (TOPROL -XL) 50 MG  24 hr tablet Take 1 tablet (50 mg total) by mouth daily. Take with or immediately following a meal.   polyethylene glycol (MIRALAX  / GLYCOLAX ) 17 g packet Take 17 g by mouth daily.   potassium chloride  SA (KLOR-CON  M) 20 MEQ tablet Take 1 tablet (20 mEq total) by mouth every Monday, Wednesday, and Friday for 1 dose.   vitamin B-12 (CYANOCOBALAMIN ) 1000 MCG tablet Take 1 tablet (1,000 mcg total) by mouth daily.   No facility-administered encounter medications on file as of 07/05/2023.    Review of Systems  Unable to perform ROS: Dementia    Immunization History  Administered Date(s) Administered   Fluad  Quad(high Dose 65+) 10/13/2021   Fluad  Trivalent(High Dose 65+) 10/14/2022   Influenza, High Dose Seasonal PF 10/20/2018, 11/09/2019   Influenza,inj,Quad PF,6+ Mos 11/04/2017   Influenza-Unspecified 10/25/2013, 10/31/2014, 11/06/2015, 11/01/2016, 10/31/2020   Moderna Covid-19 Vaccine  Bivalent Booster 53yrs & up 10/24/2020   Moderna SARS-COV2 Booster Vaccination 11/27/2019   Moderna Sars-Covid-2 Vaccination 01/23/2019,  02/20/2019, 06/15/2021, 11/11/2021   Pneumococcal Conjugate-13 06/04/2014   Pneumococcal Polysaccharide-23 01/12/2000   Tdap 07/19/2017   Unspecified SARS-COV-2 Vaccination 10/14/2022   Zoster Recombinant(Shingrix) 05/21/2021, 09/16/2021   Pertinent  Health Maintenance Due  Topic Date Due   OPHTHALMOLOGY EXAM  07/11/2020   HEMOGLOBIN A1C  07/18/2023   INFLUENZA VACCINE  08/12/2023   FOOT EXAM  04/27/2024   DEXA SCAN  Completed      05/04/2022   10:24 AM 05/14/2022   12:59 PM 08/31/2022   10:42 AM 01/25/2023    3:51 PM 04/28/2023   10:48 AM  Fall Risk  Falls in the past year? 0 0 0 1 1  Was there an injury with Fall? 0 0 0 0 0  Fall Risk Category Calculator 0 0 0 1 1  Patient at Risk for Falls Due to Impaired balance/gait;Impaired mobility Impaired balance/gait;Impaired mobility No Fall Risks History of fall(s);Impaired balance/gait History of fall(s)  Fall risk  Follow up Falls evaluation completed  Falls evaluation completed Falls evaluation completed;Education provided Falls evaluation completed   Functional Status Survey:    Vitals:   07/05/23 1608  BP: 131/80  Pulse: 67  Resp: 17  Temp: (!) 97 F (36.1 C)  SpO2: 92%  Weight: 154 lb 3.2 oz (69.9 kg)  Height: 5' 8 (1.727 m)   Body mass index is 23.45 kg/m. Physical Exam Vitals reviewed.  Constitutional:      General: She is not in acute distress. HENT:     Head: Normocephalic.   Eyes:     General:        Right eye: No discharge.        Left eye: No discharge.    Cardiovascular:     Rate and Rhythm: Normal rate and regular rhythm.     Pulses: Normal pulses.     Heart sounds: Normal heart sounds.  Pulmonary:     Effort: Pulmonary effort is normal. No respiratory distress.     Breath sounds: Examination of the right-middle field reveals rales. Examination of the left-middle field reveals rales. Rales present. No wheezing.  Abdominal:     Palpations: Abdomen is soft.   Musculoskeletal:     Cervical back: Neck supple.     Right lower leg: Edema present.     Left lower leg: Edema present.     Comments: BLE 1+ pitting edema, compression stocking on    Skin:    General: Skin is warm.     Capillary Refill: Capillary refill takes less than 2 seconds.   Neurological:     General: No focal deficit present.     Mental Status: She is alert. Mental status is at baseline.     Gait: Gait abnormal.   Psychiatric:        Mood and Affect: Mood normal.     Labs reviewed: Recent Labs    01/20/23 0843 01/21/23 0259 02/06/23 2351 02/08/23 0902 02/09/23 0627 02/15/23 0000  NA  --    < > 137 138 136 139  K  --    < > 4.1 4.0 4.1 4.0  CL  --    < > 105 106 105 107  CO2  --    < > 19* 23 19* 21  GLUCOSE  --    < > 151* 155* 191*  --   BUN  --    < > 55* 51* 49* 27*  CREATININE  --    < > 1.56* 1.48* 1.37* 1.1  CALCIUM   --    < > 8.5* 8.6* 8.7* 8.5*  MG 2.1  --   --  2.4   --   --    < > = values in this interval not displayed.   Recent Labs    01/18/23 0000 01/20/23 0154 02/08/23 0902  AST 22 19 17   ALT 16 15 24   ALKPHOS 108 82 88  BILITOT  --  1.5* 0.7  PROT  --  6.8 6.1*  ALBUMIN 3.8 3.4* 2.9*   Recent Labs    01/20/23 0154 01/20/23 0526 02/06/23 2351 02/08/23 0902 02/09/23 0627  WBC 13.6*   < > 13.4* 10.9* 10.6*  NEUTROABS 11.2*  --   --  7.8*  --   HGB 15.6*   < > 14.2 14.2 13.5  HCT 48.7*   < > 46.2* 44.5 42.4  MCV 95.9   < > 98.3 94.3 94.2  PLT 128*   < > 164 158 177   < > = values in this interval not displayed.   Lab Results  Component Value Date   TSH 1.29 01/18/2023   Lab Results  Component Value Date   HGBA1C 6.8 01/18/2023   Lab Results  Component Value Date   CHOL 146 01/18/2023   HDL 56 01/18/2023   LDLCALC 65 01/18/2023   TRIG 126 01/18/2023    Significant Diagnostic Results in last 30 days:  No results found.  Assessment/Plan: 1. Acute cough (Primary) - onset x 1 day - rales to middle lobes, BLE with 1+ pitting edema - suspect dry cough related to CHF - will give extra furosemide  40 mg po once 06/24 and 06/26  2. Chronic diastolic CHF (congestive heart failure) (HCC) - LVEF 60-65% - see above - cont furosemide  40 mg po M/W/Fri - cont daily weights and compression stockings  3. Dementia without behavioral disturbance (HCC) - no behaviors - dependent with ADLs except feeding  - cont Aricept  and Namenda    Family/ staff Communication: plan discussed with patient and nurse  Labs/tests ordered:  none

## 2023-07-06 MED ORDER — FUROSEMIDE 40 MG PO TABS: 40.0000 mg | ORAL_TABLET | Freq: Every day | ORAL | Status: DC

## 2023-07-10 ENCOUNTER — Encounter: Payer: Self-pay | Admitting: Pulmonary Disease

## 2023-07-10 DIAGNOSIS — J849 Interstitial pulmonary disease, unspecified: Secondary | ICD-10-CM

## 2023-07-10 DIAGNOSIS — R053 Chronic cough: Secondary | ICD-10-CM

## 2023-07-11 NOTE — Telephone Encounter (Signed)
**Note De-identified  [place] Obfuscation** Please advise 

## 2023-07-12 ENCOUNTER — Encounter: Payer: Self-pay | Admitting: Orthopedic Surgery

## 2023-07-12 ENCOUNTER — Non-Acute Institutional Stay (SKILLED_NURSING_FACILITY): Payer: Self-pay | Admitting: Orthopedic Surgery

## 2023-07-12 DIAGNOSIS — Z7189 Other specified counseling: Secondary | ICD-10-CM

## 2023-07-12 DIAGNOSIS — F0390 Unspecified dementia without behavioral disturbance: Secondary | ICD-10-CM

## 2023-07-12 DIAGNOSIS — R051 Acute cough: Secondary | ICD-10-CM

## 2023-07-12 MED ORDER — BENZONATATE 200 MG PO CAPS
200.0000 mg | ORAL_CAPSULE | Freq: Two times a day (BID) | ORAL | Status: AC
Start: 2023-07-12 — End: 2023-07-17

## 2023-07-12 NOTE — Progress Notes (Signed)
 Location:  [facility] Room Number: 108-A Place of Service:  SNF [phone]) Provider:  Greig FORBES Cluster, NP   PCP: Charlanne Fredia CROME, MD  Patient Care Team: Charlanne Fredia CROME, MD as PCP - General (Internal Medicine) Lonni Slain, MD as PCP - Cardiology (Cardiology) Melodi Lerner, MD as Consulting Physician (Orthopedic Surgery) Claudene Victory ORN, MD (Inactive) as Consulting Physician (Cardiology) Cleatus Collar, MD as Consulting Physician (Ophthalmology) Jane Charleston, MD as Consulting Physician (Orthopedic Surgery)  Extended Emergency Contact Information Primary Emergency Contact: Holland,Donna  [place] Home Phone: [phone] Mobile Phone: [phone] Relation: Daughter Secondary Emergency Contact: Laredo,Arlene Home Phone: [phone] Mobile Phone: [phone] Relation: Niece  Code Status:  Full code Goals of care: Advanced Directive information    07/12/2023    2:52 PM  Advanced Directives  Does Patient Have a Medical Advance Directive? Yes  Type of Estate agent of [place];Living will  Does patient want to make changes to medical advance directive? No - Patient declined  Copy of Healthcare Power of Attorney in Chart? Yes - validated most recent copy scanned in chart (See row information)     Chief Complaint  Patient presents with   Acute Visit    Productive cough    HPI:  Pt is a 89 y.o. female seen today for acute visit due to productive cough.   She currently resides on the skilled nursing unit at [facility]. PMH: CHF, tachycardia, HTN, HLD, T2DM, Alzheimer's dementia, OA, osteopenia, DDD, CKD, memory loss and vitamin D  deficiency.   H/o diastolic HF, interstitial lung disease, and acute LLE DVT. Increased dry cough x 1 day. Poor historian due to dementia. She denies shortness of breath, chest pain or cold symptoms. No oxygen  use at this time. Nursing reports crackles on exam. Overall  weight down 3 lbs from one month ago. She is currently taking furosemide  40 mg 3x/week, Eliquis  and Trelegy. Afebrile. Vitals stable.  06/24 she was prescribed increased furosemide  x 2 days due to persistent dry cough and rales on exam. Cough improved for a few days but has returned. Cough is now productive and sometimes keeping her up at night. She is taking mucinex  for secretions. Does not wear oxygen  at this time. Remains on Trelegy and albuterol  prn. Denies chest pain, shortness of breath, fever and malaise. CT chest recommended by pulmonary, but held off since she was stable. Dr. Theophilus had been following her mild interstitial lung disease and pulmonary fibrosis.   Patient full code. Discussed decreased chances of successful resuscitation due to advanced age and chronic lung conditions with daughter, Arland. She reports her mother had a DNR at one time. She would like to discuss with siblings/HPOA.       Past Medical History:  Diagnosis Date   Abscess of right leg 12/24/2020   Childhood asthma    Chronic diastolic heart failure (HCC) 12/25/2012   October 2013 echocardiogram 1. There is moderate concentric left ventricle hypertrophy. 2. Left ventricular ejection fraction estimated by 2D at 60-65 percent. 3. There were no regional wall motion abnormalities. 4. Mild mitral annular calcification. 5. Mild mitral valve regurgitation. 6. There is mild tricuspid regurgitation. 7. Mildly elevated estimated right ventricular systolic pressure. 8. Right ventricular systolic pressure estimated at 35-40 mm Hg. 9. Mild calcification of the aortic valve. 10. Trace aortic valve regurgitation. 11. Analysis of mitral valve inflow, pulmonary vein Doppler and tissue Doppler suggests grade I diastolic dysfunction without elevated left atrial pressure.    Colon  polyp    Diverticular stricture (HCC) 2009   Diverticulosis 10/30/2007   DM (diabetes mellitus), type 2 (HCC)    Edema 09/12/2013   Fall 05/18/2017    tripped over wheel of walker; superior and inferior pubic ramus fractures    Hypercholesterolemia    Hypertension    Hypertension, essential    Hypothyroidism    as a teenager   Indigestion    Insomnia    OA (osteoarthritis) of hip    right hip replacement, OA in bilateral knees   Osteopenia    Actonel stopped in 2012 after 5years therapy   Pneumonia 1939   hospitalized   Rapid resting heart rate 12/25/2012   Uncertain cause. Possible ectopic atrial tachycardia. Date entered 12/25/12    Right rotator cuff tear    Seasonal allergies    Unsteadiness on feet    Past Surgical History:  Procedure Laterality Date   CATARACT EXTRACTION W/ INTRAOCULAR LENS  IMPLANT, BILATERAL Bilateral    COLONOSCOPY  2009   Dr. Newell   DILATION AND CURETTAGE OF UTERUS     FRACTURE SURGERY     JOINT REPLACEMENT     ORIF WRIST FRACTURE Right    REDUCTION MAMMAPLASTY  1999   TOTAL HIP ARTHROPLASTY Right 03-04-08   Dr. Melodi    Allergies  Allergen Reactions   Trimethoprim     Per matrix   Amlodipine Besylate Other (See Comments)    Edema   Lisinopril Cough    Cough    Sulfamethoxazole-Trimethoprim Other (See Comments)    Unknown reaction     Outpatient Encounter Medications as of 07/12/2023  Medication Sig   acetaminophen  (TYLENOL ) 500 MG tablet Take 500 mg by mouth every morning.   Amino Acids-Protein Hydrolys (FEEDING SUPPLEMENT, PRO-STAT 64,) LIQD Take 30 mLs by mouth daily.   apixaban  (ELIQUIS ) 5 MG TABS tablet Take 2 tablets (10 mg total) by mouth 2 (two) times daily for 5 days, THEN 1 tablet (5 mg total) 2 (two) times daily.   apixaban  (ELIQUIS ) 5 MG TABS tablet Take 5 mg by mouth 2 (two) times daily.   atorvastatin  (LIPITOR) 40 MG tablet TAKE 1 TABLET ONCE DAILY.   buPROPion  (WELLBUTRIN  XL) 150 MG 24 hr tablet Take 1 tablet (150 mg total) by mouth daily.   Cholecalciferol 1.25 MG (50000 UT) capsule Take 50,000 Units by mouth every Friday.   donepezil  (ARICEPT ) 10 MG tablet TAKE  ONE TABLET BY MOUTH AT BEDTIME   famotidine  (PEPCID ) 20 MG tablet Take 20 mg by mouth every morning.   fexofenadine  (ALLEGRA ) 180 MG tablet Take 1 tablet (180 mg total) by mouth daily.   Fluticasone -Umeclidin-Vilant (TRELEGY ELLIPTA ) 200-62.5-25 MCG/ACT AEPB Inhale 1 puff into the lungs daily.   furosemide  (LASIX ) 40 MG tablet Take 1 tablet (40 mg total) by mouth every Monday, Wednesday, and Friday.   guaiFENesin  (MUCINEX ) 600 MG 12 hr tablet Take 1 tablet (600 mg total) by mouth 2 (two) times daily.   memantine  (NAMENDA ) 10 MG tablet TAKE 1 TABLET BY MOUTH TWICE DAILY.   metoprolol  succinate (TOPROL -XL) 50 MG 24 hr tablet Take 1 tablet (50 mg total) by mouth daily. Take with or immediately following a meal.   polyethylene glycol (MIRALAX  / GLYCOLAX ) 17 g packet Take 17 g by mouth daily.   potassium chloride  SA (KLOR-CON  M) 20 MEQ tablet Take 1 tablet (20 mEq total) by mouth every Monday, Wednesday, and Friday for 1 dose.   vitamin B-12 (CYANOCOBALAMIN ) 1000 MCG tablet Take  1 tablet (1,000 mcg total) by mouth daily.   albuterol  (VENTOLIN  HFA) 108 (90 Base) MCG/ACT inhaler Inhale 2 puffs into the lungs every 6 (six) hours as needed for wheezing or shortness of breath. (Patient not taking: Reported on 07/12/2023)   diclofenac  Sodium (VOLTAREN ) 1 % GEL Apply 2 g topically 4 (four) times daily as needed (arthritis pain). (Patient not taking: Reported on 07/12/2023)   [DISCONTINUED] furosemide  (LASIX ) 40 MG tablet Take 1 tablet (40 mg total) by mouth daily. Give on 06/24 & 06/26 then discontinue   No facility-administered encounter medications on file as of 07/12/2023.    Review of Systems  Unable to perform ROS: Dementia    Immunization History  Administered Date(s) Administered   Fluad  Quad(high Dose 65+) 10/13/2021   Fluad  Trivalent(High Dose 65+) 10/14/2022   Influenza, High Dose Seasonal PF 10/20/2018, 11/09/2019   Influenza,inj,Quad PF,6+ Mos 11/04/2017   Influenza-Unspecified 10/25/2013,  10/31/2014, 11/06/2015, 11/01/2016, 10/31/2020   Moderna Covid-19 Vaccine  Bivalent Booster 67yrs & up 10/24/2020   Moderna SARS-COV2 Booster Vaccination 11/27/2019   Moderna Sars-Covid-2 Vaccination 01/23/2019, 02/20/2019, 06/15/2021, 11/11/2021   Pneumococcal Conjugate-13 06/04/2014   Pneumococcal Polysaccharide-23 01/12/2000   Tdap 07/19/2017   Unspecified SARS-COV-2 Vaccination 10/14/2022   Zoster Recombinant(Shingrix) 05/21/2021, 09/16/2021   Pertinent  Health Maintenance Due  Topic Date Due   OPHTHALMOLOGY EXAM  07/11/2020   HEMOGLOBIN A1C  07/18/2023   INFLUENZA VACCINE  08/12/2023   FOOT EXAM  04/27/2024   DEXA SCAN  Completed      05/04/2022   10:24 AM 05/14/2022   12:59 PM 08/31/2022   10:42 AM 01/25/2023    3:51 PM 04/28/2023   10:48 AM  Fall Risk  Falls in the past year? 0 0 0 1 1  Was there an injury with Fall? 0 0 0 0 0  Fall Risk Category Calculator 0 0 0 1 1  Patient at Risk for Falls Due to Impaired balance/gait;Impaired mobility Impaired balance/gait;Impaired mobility No Fall Risks History of fall(s);Impaired balance/gait History of fall(s)  Fall risk Follow up Falls evaluation completed  Falls evaluation completed Falls evaluation completed;Education provided Falls evaluation completed   Functional Status Survey:    Vitals:   07/12/23 1442  BP: 131/80  Pulse: 67  Resp: 17  Temp: (!) 97 F (36.1 C)  SpO2: 92%  Weight: 153 lb (69.4 kg)  Height: 5' 8 (1.727 m)   Body mass index is 23.26 kg/m. Physical Exam Vitals reviewed.  Constitutional:      General: She is not in acute distress. HENT:     Head: Normocephalic.   Eyes:     General:        Right eye: No discharge.        Left eye: No discharge.    Cardiovascular:     Rate and Rhythm: Normal rate and regular rhythm.     Pulses: Normal pulses.     Heart sounds: Normal heart sounds.  Pulmonary:     Effort: Pulmonary effort is normal. No respiratory distress.     Breath sounds: Normal breath  sounds. No wheezing, rhonchi or rales.  Abdominal:     General: There is no distension.     Palpations: Abdomen is soft.   Musculoskeletal:     Cervical back: Neck supple.     Right lower leg: No edema.     Left lower leg: No edema.   Skin:    General: Skin is warm.     Capillary Refill: Capillary refill  takes less than 2 seconds.   Neurological:     General: No focal deficit present.     Mental Status: She is alert. Mental status is at baseline.     Gait: Gait abnormal.   Psychiatric:        Mood and Affect: Mood normal.     Labs reviewed: Recent Labs    01/20/23 0843 01/21/23 0259 02/06/23 2351 02/08/23 0902 02/09/23 0627 02/15/23 0000  NA  --    < > 137 138 136 139  K  --    < > 4.1 4.0 4.1 4.0  CL  --    < > 105 106 105 107  CO2  --    < > 19* 23 19* 21  GLUCOSE  --    < > 151* 155* 191*  --   BUN  --    < > 55* 51* 49* 27*  CREATININE  --    < > 1.56* 1.48* 1.37* 1.1  CALCIUM   --    < > 8.5* 8.6* 8.7* 8.5*  MG 2.1  --   --  2.4  --   --    < > = values in this interval not displayed.   Recent Labs    01/18/23 0000 01/20/23 0154 02/08/23 0902  AST 22 19 17   ALT 16 15 24   ALKPHOS 108 82 88  BILITOT  --  1.5* 0.7  PROT  --  6.8 6.1*  ALBUMIN 3.8 3.4* 2.9*   Recent Labs    01/20/23 0154 01/20/23 0526 02/06/23 2351 02/08/23 0902 02/09/23 0627  WBC 13.6*   < > 13.4* 10.9* 10.6*  NEUTROABS 11.2*  --   --  7.8*  --   HGB 15.6*   < > 14.2 14.2 13.5  HCT 48.7*   < > 46.2* 44.5 42.4  MCV 95.9   < > 98.3 94.3 94.2  PLT 128*   < > 164 158 177   < > = values in this interval not displayed.   Lab Results  Component Value Date   TSH 1.29 01/18/2023   Lab Results  Component Value Date   HGBA1C 6.8 01/18/2023   Lab Results  Component Value Date   CHOL 146 01/18/2023   HDL 56 01/18/2023   LDLCALC 65 01/18/2023   TRIG 126 01/18/2023    Significant Diagnostic Results in last 30 days:  No results found.  Assessment/Plan 1. Acute cough  (Primary) - 06/24 rales noted on exam> improved with extra furosemide  x 2 days - lung sounds clear on exam, febrile, no chest pain, no oxygen  use - productive cough> clear sputum - h/o interstitial lung disease, pulmonary fibrosis - followed by pulmonary> f/u CT chest delayed - cont mucinex  - start benzonatate  200 mg po BID> if symptoms improve consider continuing - CXR   2. Advanced directives, counseling/discussion - patient full code - low success rate for resuscitation due to advanced age and chronic lung disease - discussed DNR with daughter, HPOA> will discuss with other family members  3. Dementia without behavioral disturbance (HCC) - no behaviors - MMSE 22/30 - dependent with ADLs except feeding - weight stable - cont Aricept  and Namenda     Family/ staff Communication: plan discussed with patient, daughter and nurse  Labs/tests ordered:  CXR

## 2023-07-18 DIAGNOSIS — Z961 Presence of intraocular lens: Secondary | ICD-10-CM | POA: Diagnosis not present

## 2023-07-18 DIAGNOSIS — H52203 Unspecified astigmatism, bilateral: Secondary | ICD-10-CM | POA: Diagnosis not present

## 2023-07-18 DIAGNOSIS — E119 Type 2 diabetes mellitus without complications: Secondary | ICD-10-CM | POA: Diagnosis not present

## 2023-07-18 LAB — HM DIABETES EYE EXAM

## 2023-07-28 ENCOUNTER — Telehealth (HOSPITAL_BASED_OUTPATIENT_CLINIC_OR_DEPARTMENT_OTHER): Payer: Self-pay

## 2023-07-28 ENCOUNTER — Ambulatory Visit (HOSPITAL_COMMUNITY)

## 2023-07-28 NOTE — Telephone Encounter (Signed)
 Please have Dorthea from [facility] contact the son Dr. Alm Ferretti about the CT scan. We had ordered it at his request for resp symptoms and would like to get it done. If the patient is refusing then the family needs to know.  Jesstin Studstill MD [facility] 07/28/2023, 5:59 PM

## 2023-07-28 NOTE — Telephone Encounter (Signed)
 Copied from CRM [id number]. Topic: Clinical - Medical Advice >> Jul 28, 2023 10:39 AM Isabell A wrote: Reason for CRM: Dorthea from [employer] calling to confirm if patient needs to have CT complete or if she can reschedule - pt does not feel like going but soon wanted providers approval.   Callback number: [phone]

## 2023-07-29 NOTE — Telephone Encounter (Signed)
 I called Kelsey Salinas back and there was no answer- LMTCB.

## 2023-08-01 NOTE — Telephone Encounter (Addendum)
 Spoke to [place] with [facility] and relayed below message. He stated that he will someone reach out to patient's son and he will call back with an update tomorrow.

## 2023-08-04 ENCOUNTER — Encounter: Payer: Self-pay | Admitting: Adult Health

## 2023-08-04 ENCOUNTER — Non-Acute Institutional Stay (SKILLED_NURSING_FACILITY): Payer: Self-pay | Admitting: Adult Health

## 2023-08-04 DIAGNOSIS — N1832 Type 2 diabetes mellitus with diabetic chronic kidney disease: Secondary | ICD-10-CM

## 2023-08-04 DIAGNOSIS — E1122 Type 2 diabetes mellitus with diabetic chronic kidney disease: Secondary | ICD-10-CM

## 2023-08-04 DIAGNOSIS — F0390 Unspecified dementia without behavioral disturbance: Secondary | ICD-10-CM

## 2023-08-04 DIAGNOSIS — I824Y2 Acute embolism and thrombosis of unspecified deep veins of left proximal lower extremity: Secondary | ICD-10-CM

## 2023-08-04 DIAGNOSIS — R053 Chronic cough: Secondary | ICD-10-CM | POA: Diagnosis not present

## 2023-08-04 DIAGNOSIS — I5032 Chronic diastolic (congestive) heart failure: Secondary | ICD-10-CM

## 2023-08-04 DIAGNOSIS — I10 Essential (primary) hypertension: Secondary | ICD-10-CM

## 2023-08-04 DIAGNOSIS — J849 Interstitial pulmonary disease, unspecified: Secondary | ICD-10-CM

## 2023-08-04 DIAGNOSIS — F329 Major depressive disorder, single episode, unspecified: Secondary | ICD-10-CM | POA: Diagnosis not present

## 2023-08-04 NOTE — Progress Notes (Signed)
 Location:  Medical illustrator of Service:  SNF (31) Provider:   Bari America, ANP [facility] [phone]   Charlanne Fredia CROME, MD  Patient Care Team: Charlanne Fredia CROME, MD as PCP - General (Internal Medicine) Lonni Slain, MD as PCP - Cardiology (Cardiology) Melodi Lerner, MD as Consulting Physician (Orthopedic Surgery) Claudene Victory ORN, MD (Inactive) as Consulting Physician (Cardiology) Cleatus Collar, MD as Consulting Physician (Ophthalmology) Jane Charleston, MD as Consulting Physician (Orthopedic Surgery)  Extended Emergency Contact Information Primary Emergency Contact: Lett,Donna  [place] Home Phone: [phone] Mobile Phone: [phone] Relation: Daughter Secondary Emergency Contact: Troy,Arlene Home Phone: [phone] Mobile Phone: [phone] Relation: Niece  Code Status:  DNR Goals of care: Advanced Directive information    07/12/2023    2:52 PM  Advanced Directives  Does Patient Have a Medical Advance Directive? Yes  Type of Estate agent of [employer];Living will  Does patient want to make changes to medical advance directive? No - Patient declined  Copy of Healthcare Power of Attorney in Chart? Yes - validated most recent copy scanned in chart (See row information)     Chief Complaint  Patient presents with   Medical Management of Chronic Issues    HPI:  Pt is a Kelsey Salinas seen today for medical management of chronic diseases.    Resides in SNF at [facility] with 1:1 caretaker  She has a history of interstitial lung disease and emphysema found on high res CT and is followed by pulmonary. Has a chronic dry cough which was worse earlier this month. CXR July 2025 showed no acute findings. Pulmonary is working on a follow CT scan.   She is on Eliquis  for an acute deep vein thrombosis (DVT) found in January 2025 in the left common femoral vein. She was seen by  vascular surgery on February 17, 2023, and they recommended continuing Eliquis  indefinitely along with compression stockings. She is on lasix  three times weekly. Continues with chronic leg edema unchanged.  Wt Readings from Last 3 Encounters:  08/04/23 155 lb 6.4 oz (70.5 kg)  07/12/23 153 lb (69.4 kg)  07/05/23 154 lb 3.2 oz (69.9 kg)    She was started on Wellbutrin  due to concern for depression 03/21/23.  Her mood is flat at times but stable. No reports of crying, anxiety, depression.   Hx of type 2 DM diet controlled A1C 6.14 May 2023  Memory loss MMSE 22/30 on aricept  and namenda .  Needs a hoyer lift for transfers and assistance with ADLs.   HTN Controlled  CKD III May 2025 GFR 42 BUN 35.1 Cr 1.19   HLD on statin Lab Results  Component Value Date   LDLCALC 65 01/18/2023   Chronic diastolic CHF hx of tachycardia followed by cardiology Ef 60-65% diastolic dysfunction grade 1 severe LAE Rate controlled    Has pressure injury to buttocks that has almost healed. On prostat. Has a cushion for her chair. Her WC is not positioning her properly and she is sliding down. OT is ordered for WC positioning.   Past Medical History:  Diagnosis Date   Abscess of right leg 12/24/2020   Childhood asthma    Chronic diastolic heart failure (HCC) 12/25/2012   October 2013 echocardiogram 1. There is moderate concentric left ventricle hypertrophy. 2. Left ventricular ejection fraction estimated by 2D at 60-65 percent. 3. There were no regional wall motion abnormalities. 4. Mild mitral annular calcification. 5. Mild mitral valve regurgitation. 6. There  is mild tricuspid regurgitation. 7. Mildly elevated estimated right ventricular systolic pressure. 8. Right ventricular systolic pressure estimated at 35-40 mm Hg. 9. Mild calcification of the aortic valve. 10. Trace aortic valve regurgitation. 11. Analysis of mitral valve inflow, pulmonary vein Doppler and tissue Doppler suggests grade I diastolic  dysfunction without elevated left atrial pressure.    Colon polyp    Diverticular stricture (HCC) 2009   Diverticulosis 10/30/2007   DM (diabetes mellitus), type 2 (HCC)    Edema 09/12/2013   Fall 05/18/2017   tripped over wheel of walker; superior and inferior pubic ramus fractures    Hypercholesterolemia    Hypertension    Hypertension, essential    Hypothyroidism    as a teenager   Indigestion    Insomnia    OA (osteoarthritis) of hip    right hip replacement, OA in bilateral knees   Osteopenia    Actonel stopped in 2012 after 5years therapy   Pneumonia 1939   hospitalized   Rapid resting heart rate 12/25/2012   Uncertain cause. Possible ectopic atrial tachycardia. Date entered 12/25/12    Right rotator cuff tear    Seasonal allergies    Unsteadiness on feet    Past Surgical History:  Procedure Laterality Date   CATARACT EXTRACTION W/ INTRAOCULAR LENS  IMPLANT, BILATERAL Bilateral    COLONOSCOPY  2009   Dr. Newell   DILATION AND CURETTAGE OF UTERUS     FRACTURE SURGERY     JOINT REPLACEMENT     ORIF WRIST FRACTURE Right    REDUCTION MAMMAPLASTY  1999   TOTAL HIP ARTHROPLASTY Right 03-04-08   Dr. Melodi    Allergies  Allergen Reactions   Trimethoprim     Per matrix   Amlodipine Besylate Other (See Comments)    Edema   Lisinopril Cough    Cough    Sulfamethoxazole-Trimethoprim Other (See Comments)    Unknown reaction     Outpatient Encounter Medications as of 08/04/2023  Medication Sig   acetaminophen  (TYLENOL ) 500 MG tablet Take 500 mg by mouth every morning.   Amino Acids-Protein Hydrolys (FEEDING SUPPLEMENT, PRO-STAT 64,) LIQD Take 30 mLs by mouth daily.   apixaban  (ELIQUIS ) 5 MG TABS tablet Take 5 mg by mouth 2 (two) times daily.   atorvastatin  (LIPITOR) 40 MG tablet TAKE 1 TABLET ONCE DAILY.   buPROPion  (WELLBUTRIN  XL) 150 MG 24 hr tablet Take 1 tablet (150 mg total) by mouth daily.   Cholecalciferol 1.25 MG (50000 UT) capsule Take 50,000 Units by  mouth every Friday.   donepezil  (ARICEPT ) 10 MG tablet TAKE ONE TABLET BY MOUTH AT BEDTIME   famotidine  (PEPCID ) 20 MG tablet Take 20 mg by mouth every morning.   fexofenadine  (ALLEGRA ) 180 MG tablet Take 1 tablet (180 mg total) by mouth daily.   Fluticasone -Umeclidin-Vilant (TRELEGY ELLIPTA ) 200-62.5-25 MCG/ACT AEPB Inhale 1 puff into the lungs daily.   furosemide  (LASIX ) 40 MG tablet Take 1 tablet (40 mg total) by mouth every Monday, Wednesday, and Friday.   guaiFENesin  (MUCINEX ) 600 MG 12 hr tablet Take 1 tablet (600 mg total) by mouth 2 (two) times daily.   memantine  (NAMENDA ) 10 MG tablet TAKE 1 TABLET BY MOUTH TWICE DAILY.   metoprolol  succinate (TOPROL -XL) 50 MG 24 hr tablet Take 1 tablet (50 mg total) by mouth daily. Take with or immediately following a meal.   potassium chloride  SA (KLOR-CON  M) 20 MEQ tablet Take 1 tablet (20 mEq total) by mouth every Monday, Wednesday, and Friday for  1 dose.   albuterol  (VENTOLIN  HFA) 108 (90 Base) MCG/ACT inhaler Inhale 2 puffs into the lungs every 6 (six) hours as needed for wheezing or shortness of breath.   polyethylene glycol (MIRALAX  / GLYCOLAX ) 17 g packet Take 17 g by mouth daily.   vitamin B-12 (CYANOCOBALAMIN ) 1000 MCG tablet Take 1 tablet (1,000 mcg total) by mouth daily.   No facility-administered encounter medications on file as of 08/04/2023.    Review of Systems  Constitutional:  Negative for activity change, appetite change, chills, diaphoresis, fatigue, fever and unexpected weight change.  HENT:  Negative for congestion.   Respiratory:  Positive for cough. Negative for shortness of breath and wheezing.   Cardiovascular:  Positive for leg swelling. Negative for chest pain and palpitations.  Gastrointestinal:  Negative for abdominal distention, abdominal pain, constipation and diarrhea.  Genitourinary:  Negative for difficulty urinating and dysuria.  Musculoskeletal:  Positive for gait problem. Negative for arthralgias, back pain,  joint swelling and myalgias.  Neurological:  Negative for dizziness, tremors, seizures, syncope, facial asymmetry, speech difficulty, weakness (general), light-headedness, numbness and headaches.  Psychiatric/Behavioral:  Negative for agitation, behavioral problems, confusion and dysphoric mood.        Memory loss    Immunization History  Administered Date(s) Administered   Fluad  Quad(high Dose 65+) 10/13/2021   Fluad  Trivalent(High Dose 65+) 10/14/2022   Influenza, High Dose Seasonal PF 10/20/2018, 11/09/2019   Influenza,inj,Quad PF,6+ Mos 11/04/2017   Influenza-Unspecified 10/25/2013, 10/31/2014, 11/06/2015, 11/01/2016, 10/31/2020   Moderna Covid-19 Vaccine  Bivalent Booster 11yrs & up 10/24/2020   Moderna SARS-COV2 Booster Vaccination 11/27/2019   Moderna Sars-Covid-2 Vaccination 01/23/2019, 02/20/2019, 06/15/2021, 11/11/2021   Pneumococcal Conjugate-13 06/04/2014   Pneumococcal Polysaccharide-23 01/12/2000   Tdap 07/19/2017   Unspecified SARS-COV-2 Vaccination 10/14/2022   Zoster Recombinant(Shingrix) 05/21/2021, 09/16/2021   Pertinent  Health Maintenance Due  Topic Date Due   OPHTHALMOLOGY EXAM  07/11/2020   INFLUENZA VACCINE  08/12/2023   HEMOGLOBIN A1C  11/17/2023   FOOT EXAM  04/27/2024   DEXA SCAN  Completed      05/04/2022   10:24 AM 05/14/2022   12:59 PM 08/31/2022   10:42 AM 01/25/2023    3:51 PM 04/28/2023   10:48 AM  Fall Risk  Falls in the past year? 0 0 0 1 1  Was there an injury with Fall? 0 0 0 0 0  Fall Risk Category Calculator 0 0 0 1 1  Patient at Risk for Falls Due to Impaired balance/gait;Impaired mobility Impaired balance/gait;Impaired mobility No Fall Risks History of fall(s);Impaired balance/gait History of fall(s)  Fall risk Follow up Falls evaluation completed  Falls evaluation completed Falls evaluation completed;Education provided Falls evaluation completed   Functional Status Survey:    Vitals:   08/04/23 1806  Weight: 155 lb 6.4 oz (70.5 kg)    Body mass index is 23.63 kg/m. Physical Exam Vitals and nursing note reviewed.  Constitutional:      General: She is not in acute distress.    Appearance: Normal appearance. She is not diaphoretic.  HENT:     Head: Normocephalic and atraumatic.     Mouth/Throat:     Mouth: Mucous membranes are moist.     Pharynx: Oropharynx is clear.  Neck:     Thyroid : No thyromegaly.     Vascular: No carotid bruit or JVD.  Cardiovascular:     Rate and Rhythm: Normal rate and regular rhythm.     Heart sounds: Normal heart sounds. No murmur heard. Pulmonary:  Effort: Pulmonary effort is normal. No respiratory distress.     Breath sounds: No stridor. Rales (very faint in the bases) present.  Abdominal:     General: Bowel sounds are normal. There is no distension.     Palpations: Abdomen is soft.     Tenderness: There is no abdominal tenderness.  Musculoskeletal:     Cervical back: No rigidity. No muscular tenderness.     Right lower leg: Edema (+2) present.     Left lower leg: Edema (+2) present.  Lymphadenopathy:     Cervical: No cervical adenopathy.  Skin:    General: Skin is warm and dry.  Neurological:     Mental Status: She is alert. Mental status is at baseline.  Psychiatric:        Mood and Affect: Mood normal.     Labs reviewed: Recent Labs    01/20/23 0843 01/21/23 0259 02/06/23 2351 02/08/23 0902 02/09/23 0627 02/15/23 0000 05/12/23 0000 05/19/23 0000  NA  --    < > 137 138 136 139 138 139  K  --    < > 4.1 4.0 4.1 4.0 4.3 4.1  CL  --    < > 105 106 105 107 104 105  CO2  --    < > 19* 23 19* 21 22 22   GLUCOSE  --    < > 151* 155* 191*  --   --   --   BUN  --    < > 55* 51* 49* 27* 42* 35*  CREATININE  --    < > 1.56* 1.48* 1.37* 1.1 1.2* 1.2*  CALCIUM   --    < > 8.5* 8.6* 8.7* 8.5* 9.2 9.0  MG 2.1  --   --  2.4  --   --   --   --    < > = values in this interval not displayed.   Recent Labs    01/18/23 0000 01/20/23 0154 02/08/23 0902  AST 22 19 17    ALT 16 15 24   ALKPHOS 108 82 Kelsey  BILITOT  --  1.5* 0.7  PROT  --  6.8 6.1*  ALBUMIN 3.8 3.4* 2.9*   Recent Labs    01/20/23 0154 01/20/23 0526 02/06/23 2351 02/08/23 0902 02/09/23 0627  WBC 13.6*   < > 13.4* 10.9* 10.6*  NEUTROABS 11.2*  --   --  7.8*  --   HGB 15.6*   < > 14.2 14.2 13.5  HCT 48.7*   < > 46.2* 44.5 42.4  MCV 95.9   < > 98.3 94.3 94.2  PLT 128*   < > 164 158 177   < > = values in this interval not displayed.   Lab Results  Component Value Date   TSH 1.29 01/18/2023   Lab Results  Component Value Date   HGBA1C 6.3 05/17/2023   Lab Results  Component Value Date   CHOL 146 01/18/2023   HDL 56 01/18/2023   LDLCALC 65 01/18/2023   TRIG 126 01/18/2023    Significant Diagnostic Results in last 30 days:  No results found.  Assessment/Plan  Type II diabetes mellitus with stage 3 chronic kidney disease (HCC) A1C is at goal Diet controlled Annual eye exam recommended   Reactive depression Stable mood on wellbutrin   Primary hypertension Controlled on metoprolol  and lasix  Monitor renal function   Dementia without behavioral disturbance (HCC) Resides in skilled care On aricept  and namenda .    CKD (chronic kidney disease) stage 3,  GFR 30-59 ml/min (HCC) Continue to periodically monitor BMP and avoid nephrotoxic agents   Chronic diastolic heart failure (HCC) Compensated Followed by cardiology Lasix  M W F   Chronic cough Neg CXR  July 2025 Not on oxygen  Cough is chronic but less congested Continue mucinex  and reflux meds  DVT of proximal leg (deep vein thrombosis) (HCC) Found Jan of 2025 On Eliquis  indefinitely Continue compression hose  Interstitial lung disease (HCC)  Followed by pulmonary  On trelegy 93% RA Will communicate with staff to coordinate regarding CT scan of the chest if still needed     OT for WC positioning ordered  Labs/tests ordered:  Will need CBC with next lab draw

## 2023-08-04 NOTE — Assessment & Plan Note (Signed)
Continue to periodically monitor BMP and avoid nephrotoxic agents  

## 2023-08-04 NOTE — Assessment & Plan Note (Signed)
  Followed by pulmonary  On trelegy 93% RA Will communicate with staff to coordinate regarding CT scan of the chest if still needed

## 2023-08-04 NOTE — Assessment & Plan Note (Signed)
 Controlled on metoprolol  and lasix  Monitor renal function

## 2023-08-04 NOTE — Assessment & Plan Note (Signed)
 Compensated Followed by cardiology Lasix  M W F

## 2023-08-04 NOTE — Assessment & Plan Note (Signed)
 A1C is at goal Diet controlled Annual eye exam recommended

## 2023-08-04 NOTE — Assessment & Plan Note (Signed)
 Neg CXR  July 2025 Not on oxygen  Cough is chronic but less congested Continue mucinex  and reflux meds

## 2023-08-04 NOTE — Assessment & Plan Note (Signed)
 Stable mood on wellbutrin 

## 2023-08-04 NOTE — Assessment & Plan Note (Signed)
 Resides in skilled care On aricept  and namenda .

## 2023-08-04 NOTE — Assessment & Plan Note (Signed)
 Found Jan of 2025 On Eliquis  indefinitely Continue compression hose

## 2023-08-10 DIAGNOSIS — F0390 Unspecified dementia without behavioral disturbance: Secondary | ICD-10-CM | POA: Diagnosis not present

## 2023-08-10 DIAGNOSIS — R2681 Unsteadiness on feet: Secondary | ICD-10-CM | POA: Diagnosis not present

## 2023-08-11 NOTE — Telephone Encounter (Signed)
 Lm for [facility] with [facility] for update.

## 2023-08-12 DIAGNOSIS — F0390 Unspecified dementia without behavioral disturbance: Secondary | ICD-10-CM | POA: Diagnosis not present

## 2023-08-12 DIAGNOSIS — R2681 Unsteadiness on feet: Secondary | ICD-10-CM | POA: Diagnosis not present

## 2023-08-15 DIAGNOSIS — F0390 Unspecified dementia without behavioral disturbance: Secondary | ICD-10-CM | POA: Diagnosis not present

## 2023-08-15 DIAGNOSIS — R2681 Unsteadiness on feet: Secondary | ICD-10-CM | POA: Diagnosis not present

## 2023-08-16 DIAGNOSIS — R2681 Unsteadiness on feet: Secondary | ICD-10-CM | POA: Diagnosis not present

## 2023-08-16 DIAGNOSIS — F0390 Unspecified dementia without behavioral disturbance: Secondary | ICD-10-CM | POA: Diagnosis not present

## 2023-08-17 DIAGNOSIS — F0390 Unspecified dementia without behavioral disturbance: Secondary | ICD-10-CM | POA: Diagnosis not present

## 2023-08-17 DIAGNOSIS — R2681 Unsteadiness on feet: Secondary | ICD-10-CM | POA: Diagnosis not present

## 2023-08-23 DIAGNOSIS — F0390 Unspecified dementia without behavioral disturbance: Secondary | ICD-10-CM | POA: Diagnosis not present

## 2023-08-23 DIAGNOSIS — R2681 Unsteadiness on feet: Secondary | ICD-10-CM | POA: Diagnosis not present

## 2023-08-30 ENCOUNTER — Encounter: Payer: Self-pay | Admitting: Orthopedic Surgery

## 2023-08-30 ENCOUNTER — Non-Acute Institutional Stay (SKILLED_NURSING_FACILITY): Payer: Self-pay | Admitting: Orthopedic Surgery

## 2023-08-30 DIAGNOSIS — N1832 Chronic kidney disease, stage 3b: Secondary | ICD-10-CM

## 2023-08-30 DIAGNOSIS — M810 Age-related osteoporosis without current pathological fracture: Secondary | ICD-10-CM | POA: Diagnosis not present

## 2023-08-30 DIAGNOSIS — I10 Essential (primary) hypertension: Secondary | ICD-10-CM

## 2023-08-30 DIAGNOSIS — F0390 Unspecified dementia without behavioral disturbance: Secondary | ICD-10-CM | POA: Diagnosis not present

## 2023-08-30 DIAGNOSIS — J849 Interstitial pulmonary disease, unspecified: Secondary | ICD-10-CM

## 2023-08-30 DIAGNOSIS — I825Y2 Chronic embolism and thrombosis of unspecified deep veins of left proximal lower extremity: Secondary | ICD-10-CM

## 2023-08-30 DIAGNOSIS — R2681 Unsteadiness on feet: Secondary | ICD-10-CM | POA: Diagnosis not present

## 2023-08-30 DIAGNOSIS — F329 Major depressive disorder, single episode, unspecified: Secondary | ICD-10-CM | POA: Diagnosis not present

## 2023-08-30 DIAGNOSIS — E1122 Type 2 diabetes mellitus with diabetic chronic kidney disease: Secondary | ICD-10-CM | POA: Diagnosis not present

## 2023-08-30 DIAGNOSIS — I5032 Chronic diastolic (congestive) heart failure: Secondary | ICD-10-CM

## 2023-08-30 DIAGNOSIS — E782 Mixed hyperlipidemia: Secondary | ICD-10-CM | POA: Diagnosis not present

## 2023-08-30 NOTE — Progress Notes (Unsigned)
 Location:  [facility] Room Number: 108 P Place of Service:  SNF (31) Provider: Greig Cluster, NP   Patient Care Team: Charlanne Fredia CROME, MD as PCP - General (Internal Medicine) Lonni Slain, MD as PCP - Cardiology (Cardiology) Melodi Lerner, MD as Consulting Physician (Orthopedic Surgery) Claudene Victory ORN, MD (Inactive) as Consulting Physician (Cardiology) Cleatus Collar, MD as Consulting Physician (Ophthalmology) Jane Charleston, MD as Consulting Physician (Orthopedic Surgery)  Extended Emergency Contact Information Primary Emergency Contact: [name]  [place] Home Phone: [phone] Mobile Phone: [phone] Relation: Daughter Secondary Emergency Contact: Valley,Arlene Home Phone: [phone] Mobile Phone: [phone] Relation: Niece  Code Status:  DNR Goals of care: Advanced Directive information    07/12/2023    2:52 PM  Advanced Directives  Does Patient Have a Medical Advance Directive? Yes  Type of Estate agent of [place];Living will  Does patient want to make changes to medical advance directive? No - Patient declined  Copy of Healthcare Power of Attorney in Chart? Yes - validated most recent copy scanned in chart (See row information)     Chief Complaint  Patient presents with   Routine Visit    HPI:  Pt is a 89 y.o. female seen today for medical management of chronic diseases.   She currently resides on the skilled nursing unit at [facility]. PMH: CHF, tachycardia, HTN, HLD, T2DM, Alzheimer's dementia, OA, osteopenia, DDD, CKD, memory loss and vitamin D  deficiency.   CHF- LVEF 60-65%, BNP 125.6 02/06/2023, no cough, weight gain or swelling, wears compression stockings, remains on furosemide /KCL T2DM- A1c 6.3 05/17/2023, not on medication  CKD- BUN/creat 35/1.2 05/19/2023 HTN- see above, remains on furosemide  HLD- remains on atorvastatin  Dementia- MMSE 22/30, has  caregiver, dependent with ADLs except feeding, nonambulatory, no recent behaviors, remains on Aricept  and Namenda  Depression- no mood changes, remains on Wellbutrin  ILD- remains on Trelegy and albuterol  prn H/o DVT- noted 01/2023> involves left common femoral vein, vascular recommended Eliquis  indefinitely and compression stockings Osteoporosis- remains on vitamin D , h/o pelvic fracture 2019  Family purchased her a new high back wheelchair. OT working with adjustments.   Recent weights:  08/19- 158 lbs  08/01- 156.2 lbs  07/18- 154.4 lbs   07/01- 153 lbs  Recent blood pressures:  08/10- 163/84  08/03- 144/79  07/27- 154/80   Past Medical History:  Diagnosis Date   Abscess of right leg 12/24/2020   Childhood asthma    Chronic diastolic heart failure (HCC) 12/25/2012   October 2013 echocardiogram 1. There is moderate concentric left ventricle hypertrophy. 2. Left ventricular ejection fraction estimated by 2D at 60-65 percent. 3. There were no regional wall motion abnormalities. 4. Mild mitral annular calcification. 5. Mild mitral valve regurgitation. 6. There is mild tricuspid regurgitation. 7. Mildly elevated estimated right ventricular systolic pressure. 8. Right ventricular systolic pressure estimated at 35-40 mm Hg. 9. Mild calcification of the aortic valve. 10. Trace aortic valve regurgitation. 11. Analysis of mitral valve inflow, pulmonary vein Doppler and tissue Doppler suggests grade I diastolic dysfunction without elevated left atrial pressure.    Colon polyp    Diverticular stricture (HCC) 2009   Diverticulosis 10/30/2007   DM (diabetes mellitus), type 2 (HCC)    Edema 09/12/2013   Fall 05/18/2017   tripped over wheel of walker; superior and inferior pubic ramus fractures    Hypercholesterolemia    Hypertension    Hypertension, essential    Hypothyroidism    as a teenager   Indigestion  Insomnia    OA (osteoarthritis) of hip    right hip replacement, OA in bilateral  knees   Osteopenia    Actonel stopped in 2012 after 5years therapy   Pneumonia 1939   hospitalized   Rapid resting heart rate 12/25/2012   Uncertain cause. Possible ectopic atrial tachycardia. Date entered 12/25/12    Right rotator cuff tear    Seasonal allergies    Unsteadiness on feet    Past Surgical History:  Procedure Laterality Date   CATARACT EXTRACTION W/ INTRAOCULAR LENS  IMPLANT, BILATERAL Bilateral    COLONOSCOPY  2009   Dr. Newell   DILATION AND CURETTAGE OF UTERUS     FRACTURE SURGERY     JOINT REPLACEMENT     ORIF WRIST FRACTURE Right    REDUCTION MAMMAPLASTY  1999   TOTAL HIP ARTHROPLASTY Right 03-04-08   Dr. Melodi    Allergies  Allergen Reactions   Trimethoprim     Per matrix   Amlodipine Besylate Other (See Comments)    Edema   Lisinopril Cough    Cough    Sulfamethoxazole-Trimethoprim Other (See Comments)    Unknown reaction     Outpatient Encounter Medications as of 08/30/2023  Medication Sig   acetaminophen  (TYLENOL ) 500 MG tablet Take 500 mg by mouth every morning.   Amino Acids-Protein Hydrolys (FEEDING SUPPLEMENT, PRO-STAT 64,) LIQD Take 30 mLs by mouth daily.   apixaban  (ELIQUIS ) 5 MG TABS tablet Take 5 mg by mouth 2 (two) times daily.   atorvastatin  (LIPITOR) 40 MG tablet TAKE 1 TABLET ONCE DAILY.   buPROPion  (WELLBUTRIN  XL) 150 MG 24 hr tablet Take 1 tablet (150 mg total) by mouth daily.   Cholecalciferol 1.25 MG (50000 UT) capsule Take 50,000 Units by mouth every Friday.   donepezil  (ARICEPT ) 10 MG tablet TAKE ONE TABLET BY MOUTH AT BEDTIME   famotidine  (PEPCID ) 20 MG tablet Take 20 mg by mouth every morning.   fexofenadine  (ALLEGRA ) 180 MG tablet Take 1 tablet (180 mg total) by mouth daily.   Fluticasone -Umeclidin-Vilant (TRELEGY ELLIPTA ) 200-62.5-25 MCG/ACT AEPB Inhale 1 puff into the lungs daily.   furosemide  (LASIX ) 40 MG tablet Take 1 tablet (40 mg total) by mouth every Monday, Wednesday, and Friday.   guaiFENesin  (MUCINEX ) 600 MG  12 hr tablet Take 1 tablet (600 mg total) by mouth 2 (two) times daily.   memantine  (NAMENDA ) 10 MG tablet TAKE 1 TABLET BY MOUTH TWICE DAILY.   metoprolol  succinate (TOPROL -XL) 50 MG 24 hr tablet Take 1 tablet (50 mg total) by mouth daily. Take with or immediately following a meal.   polyethylene glycol (MIRALAX  / GLYCOLAX ) 17 g packet Take 17 g by mouth daily.   potassium chloride  SA (KLOR-CON  M) 20 MEQ tablet Take 1 tablet (20 mEq total) by mouth every Monday, Wednesday, and Friday for 1 dose.   vitamin B-12 (CYANOCOBALAMIN ) 1000 MCG tablet Take 1 tablet (1,000 mcg total) by mouth daily.   albuterol  (VENTOLIN  HFA) 108 (90 Base) MCG/ACT inhaler Inhale 2 puffs into the lungs every 6 (six) hours as needed for wheezing or shortness of breath.   No facility-administered encounter medications on file as of 08/30/2023.    Review of Systems  Unable to perform ROS: Dementia    Immunization History  Administered Date(s) Administered   Fluad  Quad(high Dose 65+) 10/13/2021   Fluad  Trivalent(High Dose 65+) 10/14/2022   Influenza, High Dose Seasonal PF 10/20/2018, 11/09/2019   Influenza,inj,Quad PF,6+ Mos 11/04/2017   Influenza-Unspecified 10/25/2013, 10/31/2014, 11/06/2015, 11/01/2016,  10/31/2020   Moderna Covid-19 Vaccine  Bivalent Booster 62yrs & up 10/24/2020   Moderna SARS-COV2 Booster Vaccination 11/27/2019   Moderna Sars-Covid-2 Vaccination 01/23/2019, 02/20/2019, 06/15/2021, 11/11/2021   Pneumococcal Conjugate-13 06/04/2014   Pneumococcal Polysaccharide-23 01/12/2000   Tdap 07/19/2017   Unspecified SARS-COV-2 Vaccination 10/14/2022   Zoster Recombinant(Shingrix) 05/21/2021, 09/16/2021   Pertinent  Health Maintenance Due  Topic Date Due   INFLUENZA VACCINE  08/12/2023   HEMOGLOBIN A1C  11/17/2023   FOOT EXAM  04/27/2024   OPHTHALMOLOGY EXAM  07/17/2024   DEXA SCAN  Completed      05/04/2022   10:24 AM 05/14/2022   12:59 PM 08/31/2022   10:42 AM 01/25/2023    3:51 PM 04/28/2023    10:48 AM  Fall Risk  Falls in the past year? 0 0 0 1 1  Was there an injury with Fall? 0 0 0 0 0  Fall Risk Category Calculator 0 0 0 1 1  Patient at Risk for Falls Due to Impaired balance/gait;Impaired mobility Impaired balance/gait;Impaired mobility No Fall Risks History of fall(s);Impaired balance/gait History of fall(s)  Fall risk Follow up Falls evaluation completed  Falls evaluation completed Falls evaluation completed;Education provided Falls evaluation completed   Functional Status Survey:    Vitals:   08/30/23 1343  BP: (!) 163/84  Pulse: 67  Resp: 18  Temp: (!) 97.4 F (36.3 C)  SpO2: 94%  Weight: 158 lb (71.7 kg)  Height: 5' 8 (1.727 m)   Body mass index is 24.02 kg/m. Physical Exam Vitals reviewed.  Constitutional:      General: She is not in acute distress. HENT:     Head: Normocephalic.     Right Ear: There is no impacted cerumen.     Left Ear: There is no impacted cerumen.     Nose: Nose normal.     Mouth/Throat:     Mouth: Mucous membranes are moist.  Eyes:     General:        Right eye: No discharge.        Left eye: No discharge.  Cardiovascular:     Rate and Rhythm: Normal rate and regular rhythm.     Pulses: Normal pulses.     Heart sounds: Normal heart sounds.  Pulmonary:     Effort: Pulmonary effort is normal.     Breath sounds: Normal breath sounds.  Abdominal:     General: Bowel sounds are normal.     Palpations: Abdomen is soft.  Musculoskeletal:     Cervical back: Neck supple.     Right lower leg: No edema.     Left lower leg: No edema.  Skin:    General: Skin is warm.     Capillary Refill: Capillary refill takes less than 2 seconds.  Neurological:     General: No focal deficit present.     Mental Status: She is alert. Mental status is at baseline.     Gait: Gait abnormal.  Psychiatric:        Mood and Affect: Mood normal.     Comments: Very pleasant, alert to self/person, follows commands     Labs reviewed: Recent Labs     01/20/23 0843 01/21/23 0259 02/06/23 2351 02/08/23 0902 02/09/23 0627 02/15/23 0000 05/12/23 0000 05/19/23 0000  NA  --    < > 137 138 136 139 138 139  K  --    < > 4.1 4.0 4.1 4.0 4.3 4.1  CL  --    < >  105 106 105 107 104 105  CO2  --    < > 19* 23 19* 21 22 22   GLUCOSE  --    < > 151* 155* 191*  --   --   --   BUN  --    < > 55* 51* 49* 27* 42* 35*  CREATININE  --    < > 1.56* 1.48* 1.37* 1.1 1.2* 1.2*  CALCIUM   --    < > 8.5* 8.6* 8.7* 8.5* 9.2 9.0  MG 2.1  --   --  2.4  --   --   --   --    < > = values in this interval not displayed.   Recent Labs    01/18/23 0000 01/20/23 0154 02/08/23 0902  AST 22 19 17   ALT 16 15 24   ALKPHOS 108 82 88  BILITOT  --  1.5* 0.7  PROT  --  6.8 6.1*  ALBUMIN 3.8 3.4* 2.9*   Recent Labs    01/20/23 0154 01/20/23 0526 02/06/23 2351 02/08/23 0902 02/09/23 0627  WBC 13.6*   < > 13.4* 10.9* 10.6*  NEUTROABS 11.2*  --   --  7.8*  --   HGB 15.6*   < > 14.2 14.2 13.5  HCT 48.7*   < > 46.2* 44.5 42.4  MCV 95.9   < > 98.3 94.3 94.2  PLT 128*   < > 164 158 177   < > = values in this interval not displayed.   Lab Results  Component Value Date   TSH 1.29 01/18/2023   Lab Results  Component Value Date   HGBA1C 6.3 05/17/2023   Lab Results  Component Value Date   CHOL 146 01/18/2023   HDL 56 01/18/2023   LDLCALC 65 01/18/2023   TRIG 126 01/18/2023    Significant Diagnostic Results in last 30 days:  No results found.  Assessment/Plan 1. Chronic diastolic CHF (congestive heart failure) (HCC) (Primary) - appears compensated - cont Lasix  3x/week  2. Type 2 diabetes mellitus with stage 3b chronic kidney disease, without long-term current use of insulin  (HCC) - A1c stable - diet controlled  3. Essential hypertension - controlled with metoprolol  and furosemide    4. Mixed hyperlipidemia -cont statin  5. Dementia without behavioral disturbance (HCC) - no behaviors - dependent with ADLs except feeding - has caregiver  -  weight stable - cont Aricept  and Namenda   6. Reactive depression - no changes in mood - cont Wellbutrin   7. Interstitial lung disease (HCC) - stable with Trelegy and albuterol   8. History of DVT (deep vein thrombosis) - noted 01/2023> left common femoral vein - vascular recommended eliquis  indefinitely and compression stockings  9. Age-related osteoporosis without current pathological fracture - h/o pelvic fracture 2019 - cont vitamin D     Family/ staff Communication: plan discussed with patient and nurse  Labs/tests ordered:  cbc/diff needed with next lab draw

## 2023-08-31 DIAGNOSIS — R2681 Unsteadiness on feet: Secondary | ICD-10-CM | POA: Diagnosis not present

## 2023-08-31 DIAGNOSIS — F0390 Unspecified dementia without behavioral disturbance: Secondary | ICD-10-CM | POA: Diagnosis not present

## 2023-09-01 DIAGNOSIS — R2681 Unsteadiness on feet: Secondary | ICD-10-CM | POA: Diagnosis not present

## 2023-09-01 DIAGNOSIS — F0390 Unspecified dementia without behavioral disturbance: Secondary | ICD-10-CM | POA: Diagnosis not present

## 2023-09-06 DIAGNOSIS — F0390 Unspecified dementia without behavioral disturbance: Secondary | ICD-10-CM | POA: Diagnosis not present

## 2023-09-06 DIAGNOSIS — R2681 Unsteadiness on feet: Secondary | ICD-10-CM | POA: Diagnosis not present

## 2023-09-13 DIAGNOSIS — B351 Tinea unguium: Secondary | ICD-10-CM | POA: Diagnosis not present

## 2023-09-13 DIAGNOSIS — E1159 Type 2 diabetes mellitus with other circulatory complications: Secondary | ICD-10-CM | POA: Diagnosis not present

## 2023-09-13 DIAGNOSIS — L84 Corns and callosities: Secondary | ICD-10-CM | POA: Diagnosis not present

## 2023-09-28 ENCOUNTER — Encounter (HOSPITAL_BASED_OUTPATIENT_CLINIC_OR_DEPARTMENT_OTHER): Payer: Self-pay

## 2023-09-28 NOTE — Progress Notes (Signed)
  Cardiology Office Note:  .   Date:  09/29/2023  ID:  Kelsey Salinas, DOB 25-Feb-1926, MRN [id number] PCP: Charlanne Fredia CROME, MD  [facility] Providers Cardiologist:  Shelda Bruckner, MD {  History of Present Illness: .   Kelsey Salinas is a 89 y.o. female with a hx of chronic diastolic heart failure, chronic sinus tachycardia, and hypertension who is seen as a follow up. Patient last seen by Dr. Claudene on 04/14/2020. I met her in the hospital 09/10/21, and Dr. Raford saw her in the hospital 01/2023.  Pertinent CV history: Admitted 1/9 - 01/24/2023 with acute hypoxic respiratory failure in the setting of pneumonia and acute diastolic heart failure exacerbation. Re-admitted 02/06/23 with LLE DVT, recommended for lifelong anticoagulation by vascular surgery given immobility.  She is in wheelchair today with aide with her. She usually wears TED hose but legs are open today. Aide states that weeping of R leg just started.  Patient denies palpitations. No fevers/cough. No shortness of breath.  I spoke to her son via speakerphone to discuss the leg swelling.  ROS: Denies chest pain, shortness of breath at rest. No PND, orthopnea, or unexpected weight gain. No syncope or palpitations. ROS otherwise negative except as noted.   Studies Reviewed: SABRA    EKG:       Physical Exam:   VS:  BP (!) 144/79 (BP Location: Left Arm, Patient Position: Sitting)   Pulse 63   Ht 5' 8 (1.727 m)   Wt 159 lb (72.1 kg)   SpO2 94%   BMI 24.18 kg/m    Wt Readings from Last 3 Encounters:  09/29/23 159 lb (72.1 kg)  08/30/23 158 lb (71.7 kg)  08/04/23 155 lb 6.4 oz (70.5 kg)    GEN: Well nourished, well developed in no acute distress HEENT: Normal, moist mucous membranes NECK: No JVD CARDIAC: regular rhythm, normal S1 and S2, no rubs or gallops. No murmur. VASCULAR: Radial and DP pulses 2+ bilaterally. No carotid bruits RESPIRATORY:  Clear to auscultation without wheezing or rhonchi. Bilateral rales  R more than L. ABDOMEN: Soft, non-tender, non-distended MUSCULOSKELETAL:  in wheelchair SKIN: Warm and dry, bilateral 2-3+ LE edema with pitting and clear weeping of R calf NEUROLOGIC:  Alert and oriented x 3. No focal neuro deficits noted. PSYCHIATRIC:  Normal affect    ASSESSMENT AND PLAN: .    Chronic diastolic heart failure Stage 3a chronic kidney disease -empagliflozin  stopped 2/2 UTI -increase lasix  to daily, recheck bmet 2 weeks -elevate legs, wrap weeping area with gauze, place compression stockings over gauze pad -if daily lasix  does not improve edema, may need to change to torsemide  LLE DVT 01/2023 -recommended for lifelong anticoagulation by vascular surgery given immobility -recommended for compression stockings  History of atrial tachycardia -noted while hospitalized 08/2021 with respiratory infection -no recent symptoms -continue metoprolol    Hypertension -continue losartan    Hypercholesterolemia -tolerating atorvastatin , continue  Dispo: bmet in 2 weeks, close follow up in 2-3 weeks  Signed, Shelda Bruckner, MD   Shelda Bruckner, MD, PhD, [facility]  [facility]  [place]  Heart & Vascular at [facility] at [facility] [address], [address] [place], [place] [address] [phone]

## 2023-09-29 ENCOUNTER — Encounter (HOSPITAL_BASED_OUTPATIENT_CLINIC_OR_DEPARTMENT_OTHER): Payer: Self-pay | Admitting: Cardiology

## 2023-09-29 ENCOUNTER — Ambulatory Visit (HOSPITAL_BASED_OUTPATIENT_CLINIC_OR_DEPARTMENT_OTHER): Admitting: Cardiology

## 2023-09-29 VITALS — BP 144/79 | HR 63 | Ht 68.0 in | Wt 159.0 lb

## 2023-09-29 DIAGNOSIS — E7800 Pure hypercholesterolemia, unspecified: Secondary | ICD-10-CM

## 2023-09-29 DIAGNOSIS — Z86718 Personal history of other venous thrombosis and embolism: Secondary | ICD-10-CM | POA: Diagnosis not present

## 2023-09-29 DIAGNOSIS — N1831 Chronic kidney disease, stage 3a: Secondary | ICD-10-CM

## 2023-09-29 DIAGNOSIS — I5032 Chronic diastolic (congestive) heart failure: Secondary | ICD-10-CM

## 2023-09-29 DIAGNOSIS — R600 Localized edema: Secondary | ICD-10-CM

## 2023-09-29 DIAGNOSIS — I10 Essential (primary) hypertension: Secondary | ICD-10-CM

## 2023-09-29 MED ORDER — FUROSEMIDE 40 MG PO TABS: 40.0000 mg | ORAL_TABLET | Freq: Every day | ORAL | Status: DC

## 2023-09-29 NOTE — Patient Instructions (Addendum)
 There is significant fluid on the legs today. We need to increase the fluid pill to work on this. We will increase the lasix  to daily and check kidney function test in about two weeks. Please try to keep compression stockings on and elevate the feet as much as you can. If the leg continues to weep, I would keep clean and wrap with gauze underneath the compression stockings  We will bring you back in a few weeks to check on this, and we will let Dr. Charlanne and her team know as well.   Medication Instructions:  Increase furosemide  to 40 mg DAILY  *If you need a refill on your cardiac medications before your next appointment, please call your pharmacy*  Lab Work: Basic metabolic panel in about 10-14 days  Follow-Up: Oct 3, 11:40 am

## 2023-10-10 DIAGNOSIS — I10 Essential (primary) hypertension: Secondary | ICD-10-CM | POA: Diagnosis not present

## 2023-10-10 LAB — COMPREHENSIVE METABOLIC PANEL WITH GFR
Calcium: 9.2 (ref 8.7–10.7)
eGFR: 38

## 2023-10-10 LAB — BASIC METABOLIC PANEL WITH GFR
BUN: 45 — AB (ref 4–21)
CO2: 21 (ref 13–22)
Chloride: 104 (ref 99–108)
Creatinine: 1.3 — AB (ref 0.5–1.1)
Glucose: 129
Potassium: 4.2 meq/L (ref 3.5–5.1)
Sodium: 139 (ref 137–147)

## 2023-10-11 DIAGNOSIS — Z23 Encounter for immunization: Secondary | ICD-10-CM | POA: Diagnosis not present

## 2023-10-14 ENCOUNTER — Ambulatory Visit (HOSPITAL_BASED_OUTPATIENT_CLINIC_OR_DEPARTMENT_OTHER): Admitting: Cardiology

## 2023-10-14 ENCOUNTER — Encounter (HOSPITAL_BASED_OUTPATIENT_CLINIC_OR_DEPARTMENT_OTHER): Payer: Self-pay | Admitting: Cardiology

## 2023-10-14 VITALS — BP 122/64 | HR 64 | Ht 68.0 in | Wt 154.4 lb

## 2023-10-14 DIAGNOSIS — I10 Essential (primary) hypertension: Secondary | ICD-10-CM | POA: Diagnosis not present

## 2023-10-14 DIAGNOSIS — Z86718 Personal history of other venous thrombosis and embolism: Secondary | ICD-10-CM | POA: Diagnosis not present

## 2023-10-14 DIAGNOSIS — R600 Localized edema: Secondary | ICD-10-CM | POA: Diagnosis not present

## 2023-10-14 DIAGNOSIS — I5032 Chronic diastolic (congestive) heart failure: Secondary | ICD-10-CM | POA: Diagnosis not present

## 2023-10-14 DIAGNOSIS — N1831 Chronic kidney disease, stage 3a: Secondary | ICD-10-CM | POA: Diagnosis not present

## 2023-10-14 NOTE — Patient Instructions (Signed)
 Medication Instructions:  Your physician recommends that you continue on your current medications as directed. Please refer to the Current Medication list given to you today.  *If you need a refill on your cardiac medications before your next appointment, please call your pharmacy*  Lab Work: NONE  Testing/Procedures: NONE  Follow-Up: At [facility], you and your health needs are our priority.  As part of our continuing mission to provide you with exceptional heart care, we have created designated Provider Care Teams.  These Care Teams include your primary Cardiologist (physician) and Advanced Practice Providers (APPs -  Physician Assistants and Nurse Practitioners) who all work together to provide you with the care you need, when you need it.  We recommend signing up for the patient portal called MyChart.  Sign up information is provided on this After Visit Summary.  MyChart is used to connect with patients for Virtual Visits (Telemedicine).  Patients are able to view lab/test results, encounter notes, upcoming appointments, etc.  Non-urgent messages can be sent to your provider as well.   To learn more about what you can do with MyChart, go to [email or web address].    Your next appointment:   3 month(s)  The format for your next appointment:   In Person  Provider:   Shelda Bruckner, MD Reche ORN NP, or Rosaline RAMAN NP

## 2023-10-14 NOTE — Progress Notes (Signed)
  Cardiology Office Note:  .   Date:  10/14/2023  ID:  Kelsey Salinas, DOB 1926/01/22, MRN [id number] PCP: Charlanne Fredia CROME, MD  [facility] Providers Cardiologist:  Shelda Bruckner, MD {  History of Present Illness: .   Kelsey Salinas is a 89 y.o. female with a hx of chronic diastolic heart failure, chronic sinus tachycardia, and hypertension who is seen as a follow up. Patient last seen by Dr. Claudene on 04/14/2020. I met her in the hospital 09/10/21, and Dr. Raford saw her in the hospital 01/2023.  Pertinent CV history: Admitted 1/9 - 01/24/2023 with acute hypoxic respiratory failure in the setting of pneumonia and acute diastolic heart failure exacerbation. Re-admitted 02/06/23 with LLE DVT, recommended for lifelong anticoagulation by vascular surgery given immobility.  Today: Here with aide. Reports that after 2-3 days on the higher dose of the diuretic, legs stopped weeping. No shortness of breath. Eating well but weight down about 5 lbs from prior. Labs were drawn on 9/29 per nursing notes but I do not have results. We will ask for these to be faxed over to us . On lasix  daily. No lightheadedness.   LLE always more swollen than right.   ROS: Denies chest pain, shortness of breath at rest. No PND, orthopnea, or unexpected weight gain. No syncope or palpitations. ROS otherwise negative except as noted.   Studies Reviewed: SABRA    EKG:       Physical Exam:   VS:  BP 122/64   Pulse 64   Ht 5' 8 (1.727 m)   Wt 154 lb 6.4 oz (70 kg) Comment: this was her weight this morning per assistant  SpO2 94%   BMI 23.48 kg/m    Wt Readings from Last 3 Encounters:  10/14/23 154 lb 6.4 oz (70 kg)  09/29/23 159 lb (72.1 kg)  08/30/23 158 lb (71.7 kg)    GEN: Well nourished, well developed in no acute distress HEENT: Normal, moist mucous membranes NECK: No JVD CARDIAC: regular rhythm, normal S1 and S2, no rubs or gallops. No murmur. VASCULAR: Radial and DP pulses 2+ bilaterally. No  carotid bruits RESPIRATORY:  Clear to auscultation without wheezing or rhonchi. No rales today. ABDOMEN: Soft, non-tender, non-distended MUSCULOSKELETAL:  in wheelchair SKIN: Warm and dry, 1+ pitting LLE edema, trace pitting RLE edema, compression stockings in place NEUROLOGIC:  Alert and oriented x 3. No focal neuro deficits noted. PSYCHIATRIC:  Normal affect    ASSESSMENT AND PLAN: .    Chronic diastolic heart failure Stage 3a chronic kidney disease -empagliflozin  stopped 2/2 UTI -drastically improved on 40 mg lasix  daily. No weeping, significant reduction in size of legs, no rales on exam today -continue lasix  40 mg daily -will request recent labs UPDATED TO ADD: labs received from 10/10/23. Cr 1.31, BUN 45, K 4.2, Bicarb 21. This is within recent range. Ok to continue daily lasix  dose -continue elevation, compression stockings  LLE DVT 01/2023 -recommended for lifelong anticoagulation by vascular surgery given immobility -recommended for compression stockings  History of atrial tachycardia -noted while hospitalized 08/2021 with respiratory infection -no recent symptoms -continue metoprolol    Hypertension -continue losartan    Hypercholesterolemia -tolerating atorvastatin , continue  Dispo: 3 mos or sooner as needed  Signed, Shelda Bruckner, MD   Shelda Bruckner, MD, PhD, [facility]  [facility]  [place]  Heart & Vascular at [facility] at [facility] [address] [place], [place] [address] [phone]

## 2023-10-17 ENCOUNTER — Encounter: Payer: Self-pay | Admitting: Internal Medicine

## 2023-10-17 ENCOUNTER — Non-Acute Institutional Stay (SKILLED_NURSING_FACILITY): Payer: Self-pay | Admitting: Internal Medicine

## 2023-10-17 DIAGNOSIS — F03B0 Unspecified dementia, moderate, without behavioral disturbance, psychotic disturbance, mood disturbance, and anxiety: Secondary | ICD-10-CM | POA: Diagnosis not present

## 2023-10-17 DIAGNOSIS — I825Y2 Chronic embolism and thrombosis of unspecified deep veins of left proximal lower extremity: Secondary | ICD-10-CM | POA: Diagnosis not present

## 2023-10-17 DIAGNOSIS — E1122 Type 2 diabetes mellitus with diabetic chronic kidney disease: Secondary | ICD-10-CM | POA: Diagnosis not present

## 2023-10-17 DIAGNOSIS — I10 Essential (primary) hypertension: Secondary | ICD-10-CM

## 2023-10-17 DIAGNOSIS — N1832 Chronic kidney disease, stage 3b: Secondary | ICD-10-CM

## 2023-10-17 DIAGNOSIS — E782 Mixed hyperlipidemia: Secondary | ICD-10-CM | POA: Diagnosis not present

## 2023-10-17 DIAGNOSIS — I5032 Chronic diastolic (congestive) heart failure: Secondary | ICD-10-CM | POA: Diagnosis not present

## 2023-10-17 DIAGNOSIS — J849 Interstitial pulmonary disease, unspecified: Secondary | ICD-10-CM | POA: Diagnosis not present

## 2023-10-17 MED ORDER — FUROSEMIDE 20 MG PO TABS: 20.0000 mg | ORAL_TABLET | Freq: Every day | ORAL | Status: DC | PRN

## 2023-10-17 NOTE — Progress Notes (Signed)
 Location:  [facility]  Nursing Home Room Number: 108-P Place of Service:  SNF [phone]) Provider:  Charlanne Fredia CROME, MD  Charlanne Fredia CROME, MD  Patient Care Team: Charlanne Fredia CROME, MD as PCP - General (Internal Medicine) Lonni Slain, MD as PCP - Cardiology (Cardiology) Melodi Lerner, MD as Consulting Physician (Orthopedic Surgery) Claudene Victory ORN, MD (Inactive) as Consulting Physician (Cardiology) Cleatus Collar, MD as Consulting Physician (Ophthalmology) Jane Charleston, MD as Consulting Physician (Orthopedic Surgery)  Extended Emergency Contact Information Primary Emergency Contact: Papillion,Donna  [place] Home Phone: [phone] Mobile Phone: [phone] Relation: Daughter Secondary Emergency Contact: Nielsen,Arlene Home Phone: [phone] Mobile Phone: [phone] Relation: Niece  Code Status:  DNR Goals of care: Advanced Directive information    10/17/2023   12:44 PM  Advanced Directives  Does Patient Have a Medical Advance Directive? Yes  Type of Estate agent of [place];Out of facility DNR (pink MOST or yellow form)  Does patient want to make changes to medical advance directive? No - Patient declined  Copy of Healthcare Power of Attorney in Chart? Yes - validated most recent copy scanned in chart (See row information)  Pre-existing out of facility DNR order (yellow form or pink MOST form) Yellow form placed in chart (order not valid for inpatient use);Pink MOST form placed in chart (order not valid for inpatient use)     Chief Complaint  Patient presents with   Acute Visit     Edema     HPI:  Pt is a 89 y.o. female seen today for an acute visit for Edema Also seen for Routine management  Patient is now in SNF    H/o Acute DVT of left Leg Now on Eliquis  since 01/25    Alzheimer dementia MMSE 22/30 Patient is not in SNF as she needs help with transfers 2 assist Also has 24/7  Caregivers On Aricept  and Namenda    ILD Mild No Meds/no oxygen  either  Depression doing well on Wellbutrin    Recent Worsening Edema in Lower leg Was seen by Cardiology Lasix  increased to daily She did well Her Creat was 1.3   She did loose weight but her weight is up again today and Nurses concerned about her Edema  No leakage or drainage from legs NO SOB or Cough  She continues to have sacral wound Also as she sits in her Wheelchair   Wt Readings from Last 3 Encounters:  10/17/23 159 lb 3.2 oz (72.2 kg)  10/14/23 154 lb 6.4 oz (70 kg)  09/29/23 159 lb (72.1 kg)      Past Medical History:  Diagnosis Date   Abscess of right leg 12/24/2020   Childhood asthma    Chronic diastolic heart failure (HCC) 12/25/2012   October 2013 echocardiogram 1. There is moderate concentric left ventricle hypertrophy. 2. Left ventricular ejection fraction estimated by 2D at 60-65 percent. 3. There were no regional wall motion abnormalities. 4. Mild mitral annular calcification. 5. Mild mitral valve regurgitation. 6. There is mild tricuspid regurgitation. 7. Mildly elevated estimated right ventricular systolic pressure. 8. Right ventricular systolic pressure estimated at 35-40 mm Hg. 9. Mild calcification of the aortic valve. 10. Trace aortic valve regurgitation. 11. Analysis of mitral valve inflow, pulmonary vein Doppler and tissue Doppler suggests grade I diastolic dysfunction without elevated left atrial pressure.    Colon polyp    Diverticular stricture (HCC) 2009   Diverticulosis 10/30/2007   DM (diabetes mellitus), type 2 (HCC)    Edema 09/12/2013  Fall 05/18/2017   tripped over wheel of walker; superior and inferior pubic ramus fractures    Hypercholesterolemia    Hypertension    Hypertension, essential    Hypothyroidism    as a teenager   Indigestion    Insomnia    OA (osteoarthritis) of hip    right hip replacement, OA in bilateral knees   Osteopenia    Actonel stopped in 2012  after 5years therapy   Pneumonia 1939   hospitalized   Rapid resting heart rate 12/25/2012   Uncertain cause. Possible ectopic atrial tachycardia. Date entered 12/25/12    Right rotator cuff tear    Seasonal allergies    Unsteadiness on feet    Past Surgical History:  Procedure Laterality Date   CATARACT EXTRACTION W/ INTRAOCULAR LENS  IMPLANT, BILATERAL Bilateral    COLONOSCOPY  2009   Dr. Newell   DILATION AND CURETTAGE OF UTERUS     FRACTURE SURGERY     JOINT REPLACEMENT     ORIF WRIST FRACTURE Right    REDUCTION MAMMAPLASTY  1999   TOTAL HIP ARTHROPLASTY Right 03-04-08   Dr. Melodi    Allergies  Allergen Reactions   Trimethoprim     Per matrix   Amlodipine Besylate Other (See Comments)    Edema   Lisinopril Cough    Cough    Sulfamethoxazole-Trimethoprim Other (See Comments)    Unknown reaction     Allergies as of 10/17/2023       Reactions   Trimethoprim    Per matrix   Amlodipine Besylate Other (See Comments)   Edema   Lisinopril Cough   Cough   Sulfamethoxazole-trimethoprim Other (See Comments)   Unknown reaction        Medication List        Accurate as of October 17, 2023 12:45 PM. If you have any questions, ask your nurse or doctor.          acetaminophen  500 MG tablet Commonly known as: TYLENOL  Take 500 mg by mouth every morning.   albuterol  108 (90 Base) MCG/ACT inhaler Commonly known as: VENTOLIN  HFA Inhale 2 puffs into the lungs every 6 (six) hours as needed for wheezing or shortness of breath.   atorvastatin  40 MG tablet Commonly known as: LIPITOR TAKE 1 TABLET ONCE DAILY.   buPROPion  150 MG 24 hr tablet Commonly known as: Wellbutrin  XL Take 1 tablet (150 mg total) by mouth daily.   Cholecalciferol 1.25 MG (50000 UT) capsule Take 50,000 Units by mouth every Friday.   cyanocobalamin  1000 MCG tablet Commonly known as: VITAMIN B12 Take 1 tablet (1,000 mcg total) by mouth daily.   donepezil  10 MG tablet Commonly known  as: ARICEPT  TAKE ONE TABLET BY MOUTH AT BEDTIME   Eliquis  5 MG Tabs tablet Generic drug: apixaban  Take 5 mg by mouth 2 (two) times daily.   famotidine  20 MG tablet Commonly known as: PEPCID  Take 20 mg by mouth every morning.   feeding supplement (PRO-STAT 64) Liqd Take 30 mLs by mouth daily.   fexofenadine  180 MG tablet Commonly known as: ALLEGRA  Take 1 tablet (180 mg total) by mouth daily.   furosemide  40 MG tablet Commonly known as: LASIX  Take 1 tablet (40 mg total) by mouth daily.   guaiFENesin  600 MG 12 hr tablet Commonly known as: MUCINEX  Take 1 tablet (600 mg total) by mouth 2 (two) times daily.   memantine  10 MG tablet Commonly known as: NAMENDA  TAKE 1 TABLET BY MOUTH TWICE DAILY.  metoprolol  succinate 50 MG 24 hr tablet Commonly known as: TOPROL -XL Take 1 tablet (50 mg total) by mouth daily. Take with or immediately following a meal.   polyethylene glycol 17 g packet Commonly known as: MIRALAX  / GLYCOLAX  Take 17 g by mouth daily.   potassium chloride  SA 20 MEQ tablet Commonly known as: KLOR-CON  M Take 1 tablet (20 mEq total) by mouth every Monday, Wednesday, and Friday for 1 dose.   Trelegy Ellipta  200-62.5-25 MCG/ACT Aepb Generic drug: Fluticasone -Umeclidin-Vilant Inhale 1 puff into the lungs daily.        Review of Systems  Constitutional:  Positive for unexpected weight change. Negative for activity change and appetite change.  HENT: Negative.    Respiratory:  Positive for cough. Negative for shortness of breath.   Cardiovascular:  Positive for leg swelling.  Gastrointestinal:  Negative for constipation.  Genitourinary: Negative.   Musculoskeletal:  Positive for gait problem. Negative for arthralgias and myalgias.  Skin: Negative.   Neurological:  Negative for dizziness and weakness.  Psychiatric/Behavioral:  Positive for confusion. Negative for dysphoric mood and sleep disturbance.     Immunization History  Administered Date(s) Administered    Fluad  Quad(high Dose 65+) 10/13/2021   Fluad  Trivalent(High Dose 65+) 10/14/2022   INFLUENZA, HIGH DOSE SEASONAL PF 10/20/2018, 11/09/2019   Influenza,inj,Quad PF,6+ Mos 11/04/2017   Influenza-Unspecified 10/25/2013, 10/31/2014, 11/06/2015, 11/01/2016, 10/31/2020   Moderna Covid-19 Vaccine  Bivalent Booster 8yrs & up 10/24/2020   Moderna SARS-COV2 Booster Vaccination 11/27/2019   Moderna Sars-Covid-2 Vaccination 01/23/2019, 02/20/2019, 06/15/2021, 11/11/2021   Pneumococcal Conjugate-13 06/04/2014   Pneumococcal Polysaccharide-23 01/12/2000   Tdap 07/19/2017   Unspecified SARS-COV-2 Vaccination 10/14/2022   Zoster Recombinant(Shingrix) 05/21/2021, 09/16/2021   Pertinent  Health Maintenance Due  Topic Date Due   Influenza Vaccine  08/12/2023   HEMOGLOBIN A1C  11/17/2023   FOOT EXAM  04/27/2024   OPHTHALMOLOGY EXAM  07/17/2024   DEXA SCAN  Completed      05/14/2022   12:59 PM 08/31/2022   10:42 AM 01/25/2023    3:51 PM 04/28/2023   10:48 AM 08/31/2023    9:25 AM  Fall Risk  Falls in the past year? 0 0 1 1 1   Was there an injury with Fall? 0 0 0 0 1  Fall Risk Category Calculator 0 0 1 1 2   Patient at Risk for Falls Due to Impaired balance/gait;Impaired mobility No Fall Risks History of fall(s);Impaired balance/gait History of fall(s) History of fall(s);Impaired balance/gait;Impaired mobility  Fall risk Follow up  Falls evaluation completed Falls evaluation completed;Education provided Falls evaluation completed Falls evaluation completed;Education provided   Functional Status Survey:    Vitals:   10/17/23 1238  BP: 137/80  Pulse: 64  Resp: 18  Temp: (!) 97.2 F (36.2 C)  SpO2: 94%  Weight: 159 lb 3.2 oz (72.2 kg)   Body mass index is 24.21 kg/m. Physical Exam Vitals reviewed.  Constitutional:      Appearance: Normal appearance.  HENT:     Head: Normocephalic.     Nose: Nose normal.     Mouth/Throat:     Mouth: Mucous membranes are moist.     Pharynx: Oropharynx  is clear.  Eyes:     Pupils: Pupils are equal, round, and reactive to light.  Cardiovascular:     Rate and Rhythm: Normal rate and regular rhythm.     Pulses: Normal pulses.     Heart sounds: Normal heart sounds. No murmur heard. Pulmonary:     Effort: Pulmonary effort  is normal.     Breath sounds: Normal breath sounds.  Abdominal:     General: Abdomen is flat. Bowel sounds are normal.     Palpations: Abdomen is soft.  Musculoskeletal:        General: No swelling.     Cervical back: Neck supple.     Comments: Mild Edema in Right leg Moderate edema in Left Leg  Skin:    General: Skin is warm.  Neurological:     General: No focal deficit present.     Mental Status: She is alert and oriented to person, place, and time.  Psychiatric:        Mood and Affect: Mood normal.        Thought Content: Thought content normal.     Labs reviewed: Recent Labs    01/20/23 0843 01/21/23 0259 02/06/23 2351 02/08/23 0902 02/09/23 0627 02/15/23 0000 05/12/23 0000 05/19/23 0000  NA  --    < > 137 138 136 139 138 139  K  --    < > 4.1 4.0 4.1 4.0 4.3 4.1  CL  --    < > 105 106 105 107 104 105  CO2  --    < > 19* 23 19* 21 22 22   GLUCOSE  --    < > 151* 155* 191*  --   --   --   BUN  --    < > 55* 51* 49* 27* 42* 35*  CREATININE  --    < > 1.56* 1.48* 1.37* 1.1 1.2* 1.2*  CALCIUM   --    < > 8.5* 8.6* 8.7* 8.5* 9.2 9.0  MG 2.1  --   --  2.4  --   --   --   --    < > = values in this interval not displayed.   Recent Labs    01/18/23 0000 01/20/23 0154 02/08/23 0902  AST 22 19 17   ALT 16 15 24   ALKPHOS 108 82 88  BILITOT  --  1.5* 0.7  PROT  --  6.8 6.1*  ALBUMIN 3.8 3.4* 2.9*   Recent Labs    01/20/23 0154 01/20/23 0526 02/06/23 2351 02/08/23 0902 02/09/23 0627  WBC 13.6*   < > 13.4* 10.9* 10.6*  NEUTROABS 11.2*  --   --  7.8*  --   HGB 15.6*   < > 14.2 14.2 13.5  HCT 48.7*   < > 46.2* 44.5 42.4  MCV 95.9   < > 98.3 94.3 94.2  PLT 128*   < > 164 158 177   < > =  values in this interval not displayed.   Lab Results  Component Value Date   TSH 1.29 01/18/2023   Lab Results  Component Value Date   HGBA1C 6.3 05/17/2023   Lab Results  Component Value Date   CHOL 146 01/18/2023   HDL 56 01/18/2023   LDLCALC 65 01/18/2023   TRIG 126 01/18/2023    Significant Diagnostic Results in last 30 days:  No results found.  Assessment/Plan 1. Chronic diastolic CHF (congestive heart failure) (HCC) (Primary) Doing well on Lasix  Has gained some weight again but her Legs look with some edema Will write for Lasix  20 mg Prn for Weight above 160lbs  2. Type 2 diabetes mellitus with stage 3b chronic kidney disease, without long-term current use of insulin  (HCC) No Meds Last A1C 6.8 in 5/25  3. Chronic deep vein thrombosis (DVT) of proximal vein of left lower  extremity (HCC) On Eliquis  since 01/25 Change the dose to 2.5 mg BID for Prevention  4. Mixed hyperlipidemia Statin LDL 65 in 01/25  5. Interstitial lung disease (HCC) Not on Oxygen  On Trelegy  6. Essential hypertension Losartan  and Toprol   7. Stage 3b chronic kidney disease (HCC) Creat slightly high due to Lasix  Will continue to monitor  8. Moderate dementia, Doing well in SNF  Aricept  and Namenda   9 Depression  Maintained on Wellbutrin    Family/ staff Communication:   Labs/tests ordered:

## 2023-10-27 DIAGNOSIS — E119 Type 2 diabetes mellitus without complications: Secondary | ICD-10-CM | POA: Diagnosis not present

## 2023-10-27 DIAGNOSIS — E7800 Pure hypercholesterolemia, unspecified: Secondary | ICD-10-CM | POA: Diagnosis not present

## 2023-10-27 DIAGNOSIS — E1122 Type 2 diabetes mellitus with diabetic chronic kidney disease: Secondary | ICD-10-CM | POA: Diagnosis not present

## 2023-10-27 LAB — BASIC METABOLIC PANEL WITH GFR
BUN: 49 — AB (ref 4–21)
CO2: 25 — AB (ref 13–22)
Chloride: 105 (ref 99–108)
Creatinine: 1.4 — AB (ref 0.5–1.1)
Glucose: 114
Potassium: 4.1 meq/L (ref 3.5–5.1)
Sodium: 141 (ref 137–147)

## 2023-10-27 LAB — COMPREHENSIVE METABOLIC PANEL WITH GFR
Calcium: 9.1 (ref 8.7–10.7)
eGFR: 33

## 2023-11-17 ENCOUNTER — Non-Acute Institutional Stay (SKILLED_NURSING_FACILITY): Payer: Self-pay | Admitting: Adult Health

## 2023-11-17 DIAGNOSIS — L723 Sebaceous cyst: Secondary | ICD-10-CM

## 2023-11-18 ENCOUNTER — Encounter: Payer: Self-pay | Admitting: Adult Health

## 2023-11-18 NOTE — Progress Notes (Signed)
 Location:  [facility] Illustrator of Service:  SNF (31) Provider:   Bari [place], ANP [facility] [phone]   Charlanne Fredia CROME, MD  Patient Care Team: Charlanne Fredia CROME, MD as PCP - General (Internal Medicine) Lonni Slain, MD as PCP - Cardiology (Cardiology) Melodi Lerner, MD as Consulting Physician (Orthopedic Surgery) Claudene Victory ORN, MD (Inactive) as Consulting Physician (Cardiology) Cleatus Collar, MD as Consulting Physician (Ophthalmology) Jane Charleston, MD as Consulting Physician (Orthopedic Surgery)  Extended Emergency Contact Information Primary Emergency Contact: Goncalves,Donna  [place] Home Phone: [phone] Mobile Phone: [phone] Relation: Daughter Secondary Emergency Contact: Nhem,Arlene Home Phone: [phone] Mobile Phone: [phone] Relation: Niece  Goals of care: Advanced Directive information    10/17/2023   12:44 PM  Advanced Directives  Does Patient Have a Medical Advance Directive? Yes  Type of Estate Agent of [place];Out of facility DNR (pink MOST or yellow form)  Does patient want to make changes to medical advance directive? No - Patient declined  Copy of Healthcare Power of Attorney in Chart? Yes - validated most recent copy scanned in chart (See row information)  Pre-existing out of facility DNR order (yellow form or pink MOST form) Yellow form placed in chart (order not valid for inpatient use);Pink MOST form placed in chart (order not valid for inpatient use)     Chief Complaint  Patient presents with   Acute Visit    Pin hole drainage.     HPI:  Pt is a 89 y.o. female seen today for an acute visit for review of a pink hole area of drainage to her back  The area was noticed and reported on 11/6 The pt does not feel any pain at rest, some tenderness with palpation The drainage is clear with no redness. No fever   Past Medical History:   Diagnosis Date   Abscess of right leg 12/24/2020   Childhood asthma    Chronic diastolic heart failure (HCC) 12/25/2012   October 2013 echocardiogram 1. There is moderate concentric left ventricle hypertrophy. 2. Left ventricular ejection fraction estimated by 2D at 60-65 percent. 3. There were no regional wall motion abnormalities. 4. Mild mitral annular calcification. 5. Mild mitral valve regurgitation. 6. There is mild tricuspid regurgitation. 7. Mildly elevated estimated right ventricular systolic pressure. 8. Right ventricular systolic pressure estimated at 35-40 mm Hg. 9. Mild calcification of the aortic valve. 10. Trace aortic valve regurgitation. 11. Analysis of mitral valve inflow, pulmonary vein Doppler and tissue Doppler suggests grade I diastolic dysfunction without elevated left atrial pressure.    Colon polyp    Diverticular stricture (HCC) 2009   Diverticulosis 10/30/2007   DM (diabetes mellitus), type 2 (HCC)    Edema 09/12/2013   Fall 05/18/2017   tripped over wheel of walker; superior and inferior pubic ramus fractures    Hypercholesterolemia    Hypertension    Hypertension, essential    Hypothyroidism    as a teenager   Indigestion    Insomnia    OA (osteoarthritis) of hip    right hip replacement, OA in bilateral knees   Osteopenia    Actonel stopped in 2012 after 5years therapy   Pneumonia 1939   hospitalized   Rapid resting heart rate 12/25/2012   Uncertain cause. Possible ectopic atrial tachycardia. Date entered 12/25/12    Right rotator cuff tear    Seasonal allergies    Unsteadiness on feet    Past Surgical History:  Procedure  Laterality Date   CATARACT EXTRACTION W/ INTRAOCULAR LENS  IMPLANT, BILATERAL Bilateral    COLONOSCOPY  2009   Dr. Newell   DILATION AND CURETTAGE OF UTERUS     FRACTURE SURGERY     JOINT REPLACEMENT     ORIF WRIST FRACTURE Right    REDUCTION MAMMAPLASTY  1999   TOTAL HIP ARTHROPLASTY Right 03-04-08   Dr. Melodi     Allergies  Allergen Reactions   Trimethoprim     Per matrix   Amlodipine Besylate Other (See Comments)    Edema   Lisinopril Cough    Cough    Sulfamethoxazole-Trimethoprim Other (See Comments)    Unknown reaction     Outpatient Encounter Medications as of 11/17/2023  Medication Sig   acetaminophen  (TYLENOL ) 500 MG tablet Take 500 mg by mouth every morning.   albuterol  (VENTOLIN  HFA) 108 (90 Base) MCG/ACT inhaler Inhale 2 puffs into the lungs every 6 (six) hours as needed for wheezing or shortness of breath. (Patient not taking: Reported on 10/17/2023)   Amino Acids-Protein Hydrolys (FEEDING SUPPLEMENT, PRO-STAT 64,) LIQD Take 30 mLs by mouth daily.   apixaban  (ELIQUIS ) 5 MG TABS tablet Take 2.5 mg by mouth 2 (two) times daily.   atorvastatin  (LIPITOR) 40 MG tablet TAKE 1 TABLET ONCE DAILY.   buPROPion  (WELLBUTRIN  XL) 150 MG 24 hr tablet Take 1 tablet (150 mg total) by mouth daily.   Cholecalciferol 1.25 MG (50000 UT) capsule Take 50,000 Units by mouth every Friday.   donepezil  (ARICEPT ) 10 MG tablet TAKE ONE TABLET BY MOUTH AT BEDTIME   famotidine  (PEPCID ) 20 MG tablet Take 20 mg by mouth every morning.   fexofenadine  (ALLEGRA ) 180 MG tablet Take 1 tablet (180 mg total) by mouth daily.   Fluticasone -Umeclidin-Vilant (TRELEGY ELLIPTA ) 200-62.5-25 MCG/ACT AEPB Inhale 1 puff into the lungs daily.   furosemide  (LASIX ) 20 MG tablet Take 1 tablet (20 mg total) by mouth daily as needed. Only if weight is more then 160lbs   furosemide  (LASIX ) 40 MG tablet Take 1 tablet (40 mg total) by mouth daily.   guaiFENesin  (MUCINEX ) 600 MG 12 hr tablet Take 1 tablet (600 mg total) by mouth 2 (two) times daily.   memantine  (NAMENDA ) 10 MG tablet TAKE 1 TABLET BY MOUTH TWICE DAILY.   metoprolol  succinate (TOPROL -XL) 50 MG 24 hr tablet Take 1 tablet (50 mg total) by mouth daily. Take with or immediately following a meal.   polyethylene glycol (MIRALAX  / GLYCOLAX ) 17 g packet Take 17 g by mouth daily.    potassium chloride  SA (KLOR-CON  M) 20 MEQ tablet Take 1 tablet (20 mEq total) by mouth every Monday, Wednesday, and Friday for 1 dose.   vitamin B-12 (CYANOCOBALAMIN ) 1000 MCG tablet Take 1 tablet (1,000 mcg total) by mouth daily.   No facility-administered encounter medications on file as of 11/17/2023.    Review of Systems  Constitutional:  Negative for activity change, appetite change, chills, diaphoresis, fatigue, fever and unexpected weight change.  Skin:  Positive for wound. Negative for color change, pallor and rash.    Immunization History  Administered Date(s) Administered   Fluad  Quad(high Dose 65+) 10/13/2021   Fluad  Trivalent(High Dose 65+) 10/14/2022   INFLUENZA, HIGH DOSE SEASONAL PF 10/20/2018, 11/09/2019   Influenza,inj,Quad PF,6+ Mos 11/04/2017   Influenza-Unspecified 10/25/2013, 10/31/2014, 11/06/2015, 11/01/2016, 10/31/2020   Moderna Covid-19 Vaccine  Bivalent Booster 82yrs & up 10/24/2020   Moderna SARS-COV2 Booster Vaccination 11/27/2019   Moderna Sars-Covid-2 Vaccination 01/23/2019, 02/20/2019, 06/15/2021, 11/11/2021   Pneumococcal  Conjugate-13 06/04/2014   Pneumococcal Polysaccharide-23 01/12/2000   Tdap 07/19/2017   Unspecified SARS-COV-2 Vaccination 10/14/2022   Zoster Recombinant(Shingrix) 05/21/2021, 09/16/2021   Pertinent  Health Maintenance Due  Topic Date Due   Influenza Vaccine  08/12/2023   HEMOGLOBIN A1C  11/17/2023   FOOT EXAM  04/27/2024   OPHTHALMOLOGY EXAM  07/17/2024   DEXA SCAN  Completed      05/14/2022   12:59 PM 08/31/2022   10:42 AM 01/25/2023    3:51 PM 04/28/2023   10:48 AM 08/31/2023    9:25 AM  Fall Risk  Falls in the past year? 0 0 1 1 1   Was there an injury with Fall? 0 0 0 0 1  Fall Risk Category Calculator 0 0 1 1 2   Patient at Risk for Falls Due to Impaired balance/gait;Impaired mobility No Fall Risks History of fall(s);Impaired balance/gait History of fall(s) History of fall(s);Impaired balance/gait;Impaired mobility  Fall  risk Follow up  Falls evaluation completed Falls evaluation completed;Education provided Falls evaluation completed Falls evaluation completed;Education provided   Functional Status Survey:    There were no vitals filed for this visit. There is no height or weight on file to calculate BMI. Physical Exam Vitals and nursing note reviewed.  Constitutional:      Appearance: Normal appearance.  Skin:    General: Skin is warm and dry.     Comments: Mid back area with pin hole area draining serosanguinous fluid.  No surrounding redness No edema No purulent matter.   Neurological:     Mental Status: She is alert. Mental status is at baseline.     Labs reviewed: Recent Labs    01/20/23 0843 01/21/23 0259 02/06/23 2351 02/08/23 0902 02/09/23 0627 02/15/23 0000 05/12/23 0000 05/19/23 0000  NA  --    < > 137 138 136 139 138 139  K  --    < > 4.1 4.0 4.1 4.0 4.3 4.1  CL  --    < > 105 106 105 107 104 105  CO2  --    < > 19* 23 19* 21 22 22   GLUCOSE  --    < > 151* 155* 191*  --   --   --   BUN  --    < > 55* 51* 49* 27* 42* 35*  CREATININE  --    < > 1.56* 1.48* 1.37* 1.1 1.2* 1.2*  CALCIUM   --    < > 8.5* 8.6* 8.7* 8.5* 9.2 9.0  MG 2.1  --   --  2.4  --   --   --   --    < > = values in this interval not displayed.   Recent Labs    01/18/23 0000 01/20/23 0154 02/08/23 0902  AST 22 19 17   ALT 16 15 24   ALKPHOS 108 82 88  BILITOT  --  1.5* 0.7  PROT  --  6.8 6.1*  ALBUMIN 3.8 3.4* 2.9*   Recent Labs    01/20/23 0154 01/20/23 0526 02/06/23 2351 02/08/23 0902 02/09/23 0627  WBC 13.6*   < > 13.4* 10.9* 10.6*  NEUTROABS 11.2*  --   --  7.8*  --   HGB 15.6*   < > 14.2 14.2 13.5  HCT 48.7*   < > 46.2* 44.5 42.4  MCV 95.9   < > 98.3 94.3 94.2  PLT 128*   < > 164 158 177   < > = values in this interval not displayed.  Lab Results  Component Value Date   TSH 1.29 01/18/2023   Lab Results  Component Value Date   HGBA1C 6.3 05/17/2023   Lab Results  Component  Value Date   CHOL 146 01/18/2023   HDL 56 01/18/2023   LDLCALC 65 01/18/2023   TRIG 126 01/18/2023    Significant Diagnostic Results in last 30 days:  No results found.  Assessment/Plan 1. Sebaceous cyst (Primary)  Drainage at bedside No warmth, redness, or purulent matter Recommend dressing changes Monitor for redness or purulent matter If reoccurring may need excision.

## 2023-11-22 ENCOUNTER — Encounter: Payer: Self-pay | Admitting: Orthopedic Surgery

## 2023-11-22 ENCOUNTER — Non-Acute Institutional Stay (SKILLED_NURSING_FACILITY): Payer: Self-pay | Admitting: Orthopedic Surgery

## 2023-11-22 DIAGNOSIS — N1832 Chronic kidney disease, stage 3b: Secondary | ICD-10-CM

## 2023-11-22 DIAGNOSIS — I5032 Chronic diastolic (congestive) heart failure: Secondary | ICD-10-CM

## 2023-11-22 DIAGNOSIS — L723 Sebaceous cyst: Secondary | ICD-10-CM

## 2023-11-22 DIAGNOSIS — E872 Acidosis, unspecified: Secondary | ICD-10-CM | POA: Diagnosis not present

## 2023-11-22 DIAGNOSIS — E1122 Type 2 diabetes mellitus with diabetic chronic kidney disease: Secondary | ICD-10-CM | POA: Diagnosis not present

## 2023-11-22 DIAGNOSIS — F329 Major depressive disorder, single episode, unspecified: Secondary | ICD-10-CM | POA: Diagnosis not present

## 2023-11-22 DIAGNOSIS — E7800 Pure hypercholesterolemia, unspecified: Secondary | ICD-10-CM

## 2023-11-22 DIAGNOSIS — I10 Essential (primary) hypertension: Secondary | ICD-10-CM | POA: Diagnosis not present

## 2023-11-22 DIAGNOSIS — F0390 Unspecified dementia without behavioral disturbance: Secondary | ICD-10-CM | POA: Diagnosis not present

## 2023-11-22 DIAGNOSIS — J849 Interstitial pulmonary disease, unspecified: Secondary | ICD-10-CM

## 2023-11-22 DIAGNOSIS — I825Y2 Chronic embolism and thrombosis of unspecified deep veins of left proximal lower extremity: Secondary | ICD-10-CM

## 2023-11-22 DIAGNOSIS — M810 Age-related osteoporosis without current pathological fracture: Secondary | ICD-10-CM | POA: Diagnosis not present

## 2023-11-22 DIAGNOSIS — E1159 Type 2 diabetes mellitus with other circulatory complications: Secondary | ICD-10-CM | POA: Diagnosis not present

## 2023-11-22 DIAGNOSIS — B351 Tinea unguium: Secondary | ICD-10-CM | POA: Diagnosis not present

## 2023-11-22 DIAGNOSIS — L84 Corns and callosities: Secondary | ICD-10-CM | POA: Diagnosis not present

## 2023-11-22 LAB — COMPREHENSIVE METABOLIC PANEL WITH GFR
Calcium: 9.2 (ref 8.7–10.7)
eGFR: 38

## 2023-11-22 LAB — LIPID PANEL
Cholesterol: 145 (ref 0–200)
HDL: 59 (ref 35–70)
LDL Cholesterol: 62
Triglycerides: 123 (ref 40–160)

## 2023-11-22 LAB — CBC: RBC: 3.89 (ref 3.87–5.11)

## 2023-11-22 LAB — BASIC METABOLIC PANEL WITH GFR
BUN: 35 — AB (ref 4–21)
Creatinine: 1.3 — AB (ref 0.5–1.1)
Glucose: 135
Potassium: 4 meq/L (ref 3.5–5.1)

## 2023-11-22 LAB — HEMOGLOBIN A1C: Hemoglobin A1C: 6.9

## 2023-11-22 LAB — CBC AND DIFFERENTIAL
HCT: 37 (ref 36–46)
Hemoglobin: 12.3 (ref 12.0–16.0)
WBC: 8.2

## 2023-11-22 LAB — TSH: TSH: 1.05 (ref 0.41–5.90)

## 2023-11-22 NOTE — Progress Notes (Signed)
 Location:  [facility] Room Number: 108 P Place of Service:  SNF (31) Provider:  Greig Cluster, NP   Patient Care Team: Charlanne Fredia CROME, MD as PCP - General (Internal Medicine) Lonni Slain, MD as PCP - Cardiology (Cardiology) Melodi Lerner, MD as Consulting Physician (Orthopedic Surgery) Claudene Victory ORN, MD (Inactive) as Consulting Physician (Cardiology) Cleatus Collar, MD as Consulting Physician (Ophthalmology) Jane Charleston, MD as Consulting Physician (Orthopedic Surgery)  Extended Emergency Contact Information Primary Emergency Contact: [name]  [place] Home Phone: [phone] Mobile Phone: [phone] Relation: Daughter Secondary Emergency Contact: Guardado,Arlene Home Phone: [phone] Mobile Phone: [phone] Relation: Niece  Code Status:  DNR Goals of care: Advanced Directive information    10/17/2023   12:44 PM  Advanced Directives  Does Patient Have a Medical Advance Directive? Yes  Type of Estate Agent of [employer];Out of facility DNR (pink MOST or yellow form)  Does patient want to make changes to medical advance directive? No - Patient declined  Copy of Healthcare Power of Attorney in Chart? Yes - validated most recent copy scanned in chart (See row information)  Pre-existing out of facility DNR order (yellow form or pink MOST form) Yellow form placed in chart (order not valid for inpatient use);Pink MOST form placed in chart (order not valid for inpatient use)     Chief Complaint  Patient presents with   Routine Visit    HPI:  Pt is a 89 y.o. female seen today for medical management of chronic diseases.    She currently resides on the skilled nursing unit at [facility]. PMH: CHF, tachycardia, HTN, HLD, T2DM, Alzheimer's dementia, OA, osteopenia, DDD, CKD, memory loss and vitamin D  deficiency.    CHF- LVEF 60-65%, BNP 125.6 02/06/2023, no cough, weight gain or  swelling, wears compression stockings, edema improved with daily furosemide   T2DM- A1c 6.3 05/17/2023, not on medication  CKD- GFR 33 (10/16)> was 38 (09/29) HTN- BUN/creat 49/1.4 10/27/2023, remains on furosemide  HLD- remains on atorvastatin  Dementia- MMSE 22/30, has caregiver, dependent with ADLs except feeding, nonambulatory, no recent behaviors, remains on Aricept  and Namenda  Depression- no mood changes, remains on Wellbutrin  ILD- remains on Trelegy and albuterol  prn DVT- noted 01/2023> involves left common femoral vein, vascular recommended Eliquis  indefinitely and compression stockings Osteoporosis- remains on vitamin D , h/o pelvic fracture 2019  Podiatry visit today.   Sebaceous cyst to back almost healed.   Sacral wound healed per nursing.   Recent weights:  11/11- 160.8 lbs  10/12- 158.6 lbs  09/14- 158.2 lbs  Recent blood pressures:  11/09- 161/85  11/02- 150/82  10/26- 147/75  Past Medical History:  Diagnosis Date   Abscess of right leg 12/24/2020   Childhood asthma    Chronic diastolic heart failure (HCC) 12/25/2012   October 2013 echocardiogram 1. There is moderate concentric left ventricle hypertrophy. 2. Left ventricular ejection fraction estimated by 2D at 60-65 percent. 3. There were no regional wall motion abnormalities. 4. Mild mitral annular calcification. 5. Mild mitral valve regurgitation. 6. There is mild tricuspid regurgitation. 7. Mildly elevated estimated right ventricular systolic pressure. 8. Right ventricular systolic pressure estimated at 35-40 mm Hg. 9. Mild calcification of the aortic valve. 10. Trace aortic valve regurgitation. 11. Analysis of mitral valve inflow, pulmonary vein Doppler and tissue Doppler suggests grade I diastolic dysfunction without elevated left atrial pressure.    Colon polyp    Diverticular stricture (HCC) 2009   Diverticulosis 10/30/2007   DM (diabetes mellitus), type 2 (  HCC)    Edema 09/12/2013   Fall 05/18/2017   tripped  over wheel of walker; superior and inferior pubic ramus fractures    Hypercholesterolemia    Hypertension    Hypertension, essential    Hypothyroidism    as a teenager   Indigestion    Insomnia    OA (osteoarthritis) of hip    right hip replacement, OA in bilateral knees   Osteopenia    Actonel stopped in 2012 after 5years therapy   Pneumonia 1939   hospitalized   Rapid resting heart rate 12/25/2012   Uncertain cause. Possible ectopic atrial tachycardia. Date entered 12/25/12    Right rotator cuff tear    Seasonal allergies    Unsteadiness on feet    Past Surgical History:  Procedure Laterality Date   CATARACT EXTRACTION W/ INTRAOCULAR LENS  IMPLANT, BILATERAL Bilateral    COLONOSCOPY  2009   Dr. Newell   DILATION AND CURETTAGE OF UTERUS     FRACTURE SURGERY     JOINT REPLACEMENT     ORIF WRIST FRACTURE Right    REDUCTION MAMMAPLASTY  1999   TOTAL HIP ARTHROPLASTY Right 03-04-08   Dr. Melodi    Allergies  Allergen Reactions   Trimethoprim     Per matrix   Amlodipine Besylate Other (See Comments)    Edema   Lisinopril Cough    Cough    Sulfamethoxazole-Trimethoprim Other (See Comments)    Unknown reaction     Outpatient Encounter Medications as of 11/22/2023  Medication Sig   acetaminophen  (TYLENOL ) 500 MG tablet Take 500 mg by mouth every morning.   apixaban  (ELIQUIS ) 5 MG TABS tablet Take 2.5 mg by mouth 2 (two) times daily.   atorvastatin  (LIPITOR) 40 MG tablet TAKE 1 TABLET ONCE DAILY.   buPROPion  (WELLBUTRIN  XL) 150 MG 24 hr tablet Take 1 tablet (150 mg total) by mouth daily.   Cholecalciferol 1.25 MG (50000 UT) capsule Take 50,000 Units by mouth every Friday.   donepezil  (ARICEPT ) 10 MG tablet TAKE ONE TABLET BY MOUTH AT BEDTIME   famotidine  (PEPCID ) 20 MG tablet Take 20 mg by mouth every morning.   fexofenadine  (ALLEGRA ) 180 MG tablet Take 1 tablet (180 mg total) by mouth daily.   Fluticasone -Umeclidin-Vilant (TRELEGY ELLIPTA ) 200-62.5-25 MCG/ACT  AEPB Inhale 1 puff into the lungs daily.   furosemide  (LASIX ) 20 MG tablet Take 1 tablet (20 mg total) by mouth daily as needed. Only if weight is more then 160lbs   furosemide  (LASIX ) 40 MG tablet Take 1 tablet (40 mg total) by mouth daily.   guaiFENesin  (MUCINEX ) 600 MG 12 hr tablet Take 1 tablet (600 mg total) by mouth 2 (two) times daily.   memantine  (NAMENDA ) 10 MG tablet TAKE 1 TABLET BY MOUTH TWICE DAILY.   metoprolol  succinate (TOPROL -XL) 50 MG 24 hr tablet Take 1 tablet (50 mg total) by mouth daily. Take with or immediately following a meal.   polyethylene glycol (MIRALAX  / GLYCOLAX ) 17 g packet Take 17 g by mouth daily.   potassium chloride  SA (KLOR-CON  M) 20 MEQ tablet Take 1 tablet (20 mEq total) by mouth every Monday, Wednesday, and Friday for 1 dose.   vitamin B-12 (CYANOCOBALAMIN ) 1000 MCG tablet Take 1 tablet (1,000 mcg total) by mouth daily.   albuterol  (VENTOLIN  HFA) 108 (90 Base) MCG/ACT inhaler Inhale 2 puffs into the lungs every 6 (six) hours as needed for wheezing or shortness of breath. (Patient not taking: Reported on 11/22/2023)   Amino Acids-Protein Hydrolys (  FEEDING SUPPLEMENT, PRO-STAT 64,) LIQD Take 30 mLs by mouth daily. (Patient not taking: Reported on 11/22/2023)   No facility-administered encounter medications on file as of 11/22/2023.    Review of Systems  Unable to perform ROS: Dementia    Immunization History  Administered Date(s) Administered   Fluad  Quad(high Dose 65+) 10/13/2021   Fluad  Trivalent(High Dose 65+) 10/14/2022   INFLUENZA, HIGH DOSE SEASONAL PF 10/20/2018, 11/09/2019   Influenza,inj,Quad PF,6+ Mos 11/04/2017   Influenza-Unspecified 10/25/2013, 10/31/2014, 11/06/2015, 11/01/2016, 10/31/2020, 10/11/2023   Moderna Covid-19 Vaccine  Bivalent Booster 28yrs & up 10/24/2020   Moderna SARS-COV2 Booster Vaccination 11/27/2019   Moderna Sars-Covid-2 Vaccination 01/23/2019, 02/20/2019, 06/15/2021, 11/11/2021   Pneumococcal Conjugate-13 06/04/2014    Pneumococcal Polysaccharide-23 01/12/2000   Tdap 07/19/2017   Unspecified SARS-COV-2 Vaccination 10/14/2022, 10/11/2023   Zoster Recombinant(Shingrix) 05/21/2021, 09/16/2021   Pertinent  Health Maintenance Due  Topic Date Due   HEMOGLOBIN A1C  11/17/2023   FOOT EXAM  04/27/2024   OPHTHALMOLOGY EXAM  07/17/2024   Influenza Vaccine  Completed   DEXA SCAN  Completed      05/14/2022   12:59 PM 08/31/2022   10:42 AM 01/25/2023    3:51 PM 04/28/2023   10:48 AM 08/31/2023    9:25 AM  Fall Risk  Falls in the past year? 0 0 1 1 1   Was there an injury with Fall? 0 0 0 0 1  Fall Risk Category Calculator 0 0 1 1 2   Patient at Risk for Falls Due to Impaired balance/gait;Impaired mobility No Fall Risks History of fall(s);Impaired balance/gait History of fall(s) History of fall(s);Impaired balance/gait;Impaired mobility  Fall risk Follow up  Falls evaluation completed Falls evaluation completed;Education provided Falls evaluation completed Falls evaluation completed;Education provided   Functional Status Survey:    Vitals:   11/22/23 0930  BP: (!) 161/85  Pulse: 61  Resp: 17  Temp: (!) 97.2 F (36.2 C)  SpO2: 96%  Weight: 161 lb 3.2 oz (73.1 kg)  Height: 5' 8 (1.727 m)   Body mass index is 24.51 kg/m. Physical Exam Vitals reviewed.  Constitutional:      General: She is not in acute distress. HENT:     Head: Normocephalic.  Eyes:     General:        Right eye: No discharge.        Left eye: No discharge.  Cardiovascular:     Rate and Rhythm: Normal rate and regular rhythm.     Pulses: Normal pulses.     Heart sounds: Normal heart sounds.  Pulmonary:     Effort: Pulmonary effort is normal. No respiratory distress.     Breath sounds: Normal breath sounds. No wheezing or rales.  Abdominal:     General: Bowel sounds are normal.     Palpations: Abdomen is soft.  Musculoskeletal:     Right lower leg: Edema present.     Left lower leg: Edema present.     Comments: Non pitting,  compression stockings on  Skin:    General: Skin is warm.     Capillary Refill: Capillary refill takes less than 2 seconds.     Comments: Sacrum PU healed, skin blanchable. Pinpoint sebaceous cyst to middle upper back no sign of infection.   Neurological:     General: No focal deficit present.     Mental Status: She is alert. Mental status is at baseline.     Gait: Gait abnormal.  Psychiatric:        Mood and  Affect: Mood normal.     Labs reviewed: Recent Labs    01/20/23 0843 01/21/23 0259 02/06/23 2351 02/08/23 0902 02/09/23 0627 02/15/23 0000 05/19/23 0000 10/10/23 0000 10/27/23 0000  NA  --    < > 137 138 136   < > 139 139 141  K  --    < > 4.1 4.0 4.1   < > 4.1 4.2 4.1  CL  --    < > 105 106 105   < > 105 104 105  CO2  --    < > 19* 23 19*   < > 22 21 25*  GLUCOSE  --    < > 151* 155* 191*  --   --   --   --   BUN  --    < > 55* 51* 49*   < > 35* 45* 49*  CREATININE  --    < > 1.56* 1.48* 1.37*   < > 1.2* 1.3* 1.4*  CALCIUM   --    < > 8.5* 8.6* 8.7*   < > 9.0 9.2 9.1  MG 2.1  --   --  2.4  --   --   --   --   --    < > = values in this interval not displayed.   Recent Labs    01/18/23 0000 01/20/23 0154 02/08/23 0902  AST 22 19 17   ALT 16 15 24   ALKPHOS 108 82 88  BILITOT  --  1.5* 0.7  PROT  --  6.8 6.1*  ALBUMIN 3.8 3.4* 2.9*   Recent Labs    01/20/23 0154 01/20/23 0526 02/06/23 2351 02/08/23 0902 02/09/23 0627  WBC 13.6*   < > 13.4* 10.9* 10.6*  NEUTROABS 11.2*  --   --  7.8*  --   HGB 15.6*   < > 14.2 14.2 13.5  HCT 48.7*   < > 46.2* 44.5 42.4  MCV 95.9   < > 98.3 94.3 94.2  PLT 128*   < > 164 158 177   < > = values in this interval not displayed.   Lab Results  Component Value Date   TSH 1.29 01/18/2023   Lab Results  Component Value Date   HGBA1C 6.3 05/17/2023   Lab Results  Component Value Date   CHOL 146 01/18/2023   HDL 56 01/18/2023   LDLCALC 65 01/18/2023   TRIG 126 01/18/2023    Significant Diagnostic Results in last 30  days:  No results found.  Assessment/Plan 1. Chronic diastolic CHF (congestive heart failure) (HCC) (Primary) - improved with daily furosemide  - weight stable  - lung sounds clear, non pitting edema  2. Type 2 diabetes mellitus with stage 3b chronic kidney disease, without long-term current use of insulin  (HCC) - A1c was 6.3 05/2023 - diet controlled   3. Essential hypertension - controlled with furosemide   4. Hypercholesterolemia - LDL stable   5. Dementia without behavioral disturbance (HCC) - MMSE 22/30 - no behaviors - dependent with ADLs except feeding - ambulates with w/c - weight stable - cont Aricept  and Namenda   6. Reactive depression - stable mood with Wellbutrin    7. Interstitial lung disease (HCC) - not on oxygen  - cont Trelegy  8. Chronic deep vein thrombosis (DVT) of proximal vein of left lower extremity (HCC) - diagnosed 01/2023 - cont Eliquis   9. Age-related osteoporosis without current pathological fracture - cont vitamin D   10. Sebaceous cyst - no infection at this time  Family/ staff Communication: plan discussed with patient and nurse  Labs/tests ordered:  routine labs 11/13

## 2023-11-24 DIAGNOSIS — E1122 Type 2 diabetes mellitus with diabetic chronic kidney disease: Secondary | ICD-10-CM | POA: Diagnosis not present

## 2023-11-24 DIAGNOSIS — E7800 Pure hypercholesterolemia, unspecified: Secondary | ICD-10-CM | POA: Diagnosis not present

## 2023-11-24 DIAGNOSIS — I10 Essential (primary) hypertension: Secondary | ICD-10-CM | POA: Diagnosis not present

## 2023-12-13 ENCOUNTER — Encounter: Payer: Self-pay | Admitting: Orthopedic Surgery

## 2023-12-13 ENCOUNTER — Non-Acute Institutional Stay (SKILLED_NURSING_FACILITY): Admitting: Orthopedic Surgery

## 2023-12-13 DIAGNOSIS — E782 Mixed hyperlipidemia: Secondary | ICD-10-CM | POA: Diagnosis not present

## 2023-12-13 DIAGNOSIS — M810 Age-related osteoporosis without current pathological fracture: Secondary | ICD-10-CM | POA: Diagnosis not present

## 2023-12-13 DIAGNOSIS — E1122 Type 2 diabetes mellitus with diabetic chronic kidney disease: Secondary | ICD-10-CM | POA: Diagnosis not present

## 2023-12-13 DIAGNOSIS — I10 Essential (primary) hypertension: Secondary | ICD-10-CM

## 2023-12-13 DIAGNOSIS — L89151 Pressure ulcer of sacral region, stage 1: Secondary | ICD-10-CM | POA: Diagnosis not present

## 2023-12-13 DIAGNOSIS — J849 Interstitial pulmonary disease, unspecified: Secondary | ICD-10-CM

## 2023-12-13 DIAGNOSIS — N1832 Chronic kidney disease, stage 3b: Secondary | ICD-10-CM

## 2023-12-13 DIAGNOSIS — I5032 Chronic diastolic (congestive) heart failure: Secondary | ICD-10-CM

## 2023-12-13 DIAGNOSIS — F329 Major depressive disorder, single episode, unspecified: Secondary | ICD-10-CM | POA: Diagnosis not present

## 2023-12-13 DIAGNOSIS — F0390 Unspecified dementia without behavioral disturbance: Secondary | ICD-10-CM | POA: Diagnosis not present

## 2023-12-13 DIAGNOSIS — I825Y2 Chronic embolism and thrombosis of unspecified deep veins of left proximal lower extremity: Secondary | ICD-10-CM

## 2023-12-13 NOTE — Progress Notes (Signed)
 Location:  [facility] Room Number: 108/P Place of Service:  SNF [phone]) Provider:  Greig FORBES Cluster, NP   Charlanne Fredia CROME, MD  Patient Care Team: Charlanne Fredia CROME, MD as PCP - General (Internal Medicine) Lonni Slain, MD as PCP - Cardiology (Cardiology) Melodi Lerner, MD as Consulting Physician (Orthopedic Surgery) Claudene Victory ORN, MD (Inactive) as Consulting Physician (Cardiology) Cleatus Collar, MD as Consulting Physician (Ophthalmology) Jane Charleston, MD as Consulting Physician (Orthopedic Surgery)  Extended Emergency Contact Information Primary Emergency Contact: [name]  [place] Home Phone: [phone] Mobile Phone: [phone] Relation: Daughter Secondary Emergency Contact: Fagerstrom,Arlene Home Phone: [phone] Mobile Phone: [phone] Relation: Niece  Code Status:  DNR Goals of care: Advanced Directive information    10/17/2023   12:44 PM  Advanced Directives  Does Patient Have a Medical Advance Directive? Yes  Type of Estate Agent of [place];Out of facility DNR (pink MOST or yellow form)  Does patient want to make changes to medical advance directive? No - Patient declined  Copy of Healthcare Power of Attorney in Chart? Yes - validated most recent copy scanned in chart (See row information)  Pre-existing out of facility DNR order (yellow form or pink MOST form) Yellow form placed in chart (order not valid for inpatient use);Pink MOST form placed in chart (order not valid for inpatient use)     Chief Complaint  Patient presents with   Medical Management of Chronic Issues    HPI:  Pt is a 89 y.o. female seen today for medical management of chronic diseases.    She currently resides on the skilled nursing unit at [facility]. PMH: CHF, tachycardia, HTN, HLD, T2DM, Alzheimer's dementia, OA, osteopenia, DDD, CKD, memory loss and vitamin D  deficiency.   Pressure ulcer to  coccyx-  slow healing x 3 months, began as stage 3, almost healed, nursing using barrier cream and frequent repositioning   CHF- LVEF 60-65%, BNP 125.6 02/06/2023, no cough, weight gain or swelling, wears compression stockings, remains on furosemide  T2DM- A1c 6.9 (11/13)> was 6.3 05/17/2023, not on medication  CKD- GFR 33 (11/13)> was 33 (10/16)> was 38 (09/29) HTN- BUN/creat 35/1.31 11/24/2023, remains on furosemide  HLD- LDL 62 (11/13), remains on atorvastatin  Dementia- MMSE 22/30, has caregiver, dependent with ADLs except feeding, nonambulatory, no recent behaviors, remains on Aricept  and Namenda  Depression- no mood changes, remains on Wellbutrin  ILD- remains on Trelegy and albuterol  prn DVT- noted 01/2023> involves left common femoral vein, vascular recommended Eliquis  indefinitely and compression stockings Osteoporosis- remains on vitamin D , h/o pelvic fracture 2019  Refusing to sit in recliner. OT ordered new wheelchair last month. She likes new wheelchair.   Recent blood pressures:  11/30- 139/69   11/23- 134/75  11/09- 161/85  Recent weights:  12/01- 161.2 lbs  11/01- 160 lbs  10/01- 163.2 lbs   Past Medical History:  Diagnosis Date   Abscess of right leg 12/24/2020   Childhood asthma    Chronic diastolic heart failure (HCC) 12/25/2012   October 2013 echocardiogram 1. There is moderate concentric left ventricle hypertrophy. 2. Left ventricular ejection fraction estimated by 2D at 60-65 percent. 3. There were no regional wall motion abnormalities. 4. Mild mitral annular calcification. 5. Mild mitral valve regurgitation. 6. There is mild tricuspid regurgitation. 7. Mildly elevated estimated right ventricular systolic pressure. 8. Right ventricular systolic pressure estimated at 35-40 mm Hg. 9. Mild calcification of the aortic valve. 10. Trace aortic valve regurgitation. 11. Analysis of mitral valve inflow, pulmonary vein  Doppler and tissue Doppler suggests grade I diastolic  dysfunction without elevated left atrial pressure.    Colon polyp    Diverticular stricture (HCC) 2009   Diverticulosis 10/30/2007   DM (diabetes mellitus), type 2 (HCC)    Edema 09/12/2013   Fall 05/18/2017   tripped over wheel of walker; superior and inferior pubic ramus fractures    Hypercholesterolemia    Hypertension    Hypertension, essential    Hypothyroidism    as a teenager   Indigestion    Insomnia    OA (osteoarthritis) of hip    right hip replacement, OA in bilateral knees   Osteopenia    Actonel stopped in 2012 after 5years therapy   Pneumonia 1939   hospitalized   Rapid resting heart rate 12/25/2012   Uncertain cause. Possible ectopic atrial tachycardia. Date entered 12/25/12    Right rotator cuff tear    Seasonal allergies    Unsteadiness on feet    Past Surgical History:  Procedure Laterality Date   CATARACT EXTRACTION W/ INTRAOCULAR LENS  IMPLANT, BILATERAL Bilateral    COLONOSCOPY  2009   Dr. Newell   DILATION AND CURETTAGE OF UTERUS     FRACTURE SURGERY     JOINT REPLACEMENT     ORIF WRIST FRACTURE Right    REDUCTION MAMMAPLASTY  1999   TOTAL HIP ARTHROPLASTY Right 03-04-08   Dr. Melodi    Allergies  Allergen Reactions   Trimethoprim     Per matrix   Amlodipine Besylate Other (See Comments)    Edema   Lisinopril Cough    Cough    Sulfamethoxazole-Trimethoprim Other (See Comments)    Unknown reaction     Outpatient Encounter Medications as of 12/13/2023  Medication Sig   acetaminophen  (TYLENOL ) 500 MG tablet Take 500 mg by mouth every morning.   apixaban  (ELIQUIS ) 5 MG TABS tablet Take 2.5 mg by mouth 2 (two) times daily.   atorvastatin  (LIPITOR) 40 MG tablet TAKE 1 TABLET ONCE DAILY.   buPROPion  (WELLBUTRIN  XL) 150 MG 24 hr tablet Take 1 tablet (150 mg total) by mouth daily.   Cholecalciferol 1.25 MG (50000 UT) capsule Take 50,000 Units by mouth every Friday.   donepezil  (ARICEPT ) 10 MG tablet TAKE ONE TABLET BY MOUTH AT BEDTIME    famotidine  (PEPCID ) 20 MG tablet Take 20 mg by mouth every morning.   fexofenadine  (ALLEGRA ) 180 MG tablet Take 1 tablet (180 mg total) by mouth daily.   Fluticasone -Umeclidin-Vilant (TRELEGY ELLIPTA ) 200-62.5-25 MCG/ACT AEPB Inhale 1 puff into the lungs daily.   furosemide  (LASIX ) 20 MG tablet Take 1 tablet (20 mg total) by mouth daily as needed. Only if weight is more then 160lbs   furosemide  (LASIX ) 40 MG tablet Take 1 tablet (40 mg total) by mouth daily.   guaiFENesin  (MUCINEX ) 600 MG 12 hr tablet Take 1 tablet (600 mg total) by mouth 2 (two) times daily.   memantine  (NAMENDA ) 10 MG tablet TAKE 1 TABLET BY MOUTH TWICE DAILY.   metoprolol  succinate (TOPROL -XL) 50 MG 24 hr tablet Take 1 tablet (50 mg total) by mouth daily. Take with or immediately following a meal.   polyethylene glycol (MIRALAX  / GLYCOLAX ) 17 g packet Take 17 g by mouth daily.   potassium chloride  SA (KLOR-CON  M) 20 MEQ tablet Take 1 tablet (20 mEq total) by mouth every Monday, Wednesday, and Friday for 1 dose.   vitamin B-12 (CYANOCOBALAMIN ) 1000 MCG tablet Take 1 tablet (1,000 mcg total) by mouth daily.  No facility-administered encounter medications on file as of 12/13/2023.    Review of Systems  Unable to perform ROS: Dementia    Immunization History  Administered Date(s) Administered   Fluad  Quad(high Dose 65+) 10/13/2021   Fluad  Trivalent(High Dose 65+) 10/14/2022   INFLUENZA, HIGH DOSE SEASONAL PF 10/20/2018, 11/09/2019   Influenza,inj,Quad PF,6+ Mos 11/04/2017   Influenza-Unspecified 10/25/2013, 10/31/2014, 11/06/2015, 11/01/2016, 10/31/2020, 10/11/2023   Moderna Covid-19 Vaccine  Bivalent Booster 92yrs & up 10/24/2020   Moderna SARS-COV2 Booster Vaccination 11/27/2019   Moderna Sars-Covid-2 Vaccination 01/23/2019, 02/20/2019, 06/15/2021, 11/11/2021   Pneumococcal Conjugate-13 06/04/2014   Pneumococcal Polysaccharide-23 01/12/2000   Tdap 07/19/2017   Unspecified SARS-COV-2 Vaccination 10/14/2022, 10/11/2023    Zoster Recombinant(Shingrix) 05/21/2021, 09/16/2021   Pertinent  Health Maintenance Due  Topic Date Due   HEMOGLOBIN A1C  11/17/2023   FOOT EXAM  04/27/2024   OPHTHALMOLOGY EXAM  07/17/2024   Influenza Vaccine  Completed   Bone Density Scan  Completed      08/31/2022   10:42 AM 01/25/2023    3:51 PM 04/28/2023   10:48 AM 08/31/2023    9:25 AM 11/22/2023    2:28 PM  Fall Risk  Falls in the past year? 0 1 1 1 1   Was there an injury with Fall? 0  0  0  1  0   Fall Risk Category Calculator 0 1 1 2 1   Patient at Risk for Falls Due to No Fall Risks History of fall(s);Impaired balance/gait History of fall(s) History of fall(s);Impaired balance/gait;Impaired mobility History of fall(s)  Fall risk Follow up Falls evaluation completed Falls evaluation completed;Education provided Falls evaluation completed Falls evaluation completed;Education provided Falls evaluation completed     Data saved with a previous flowsheet row definition   Functional Status Survey:    Vitals:   12/13/23 1046  BP: 139/69  Pulse: 61  Resp: 18  Temp: 97.6 F (36.4 C)  SpO2: 97%  Weight: 161 lb 3.2 oz (73.1 kg)  Height: 5' 8 (1.727 m)   Body mass index is 24.51 kg/m. Physical Exam Vitals reviewed.  Constitutional:      General: She is not in acute distress. HENT:     Head: Normocephalic.     Right Ear: There is no impacted cerumen.     Left Ear: There is no impacted cerumen.     Nose: Nose normal.     Mouth/Throat:     Mouth: Mucous membranes are moist.  Eyes:     General:        Right eye: No discharge.        Left eye: No discharge.     Pupils: Pupils are equal, round, and reactive to light.  Cardiovascular:     Rate and Rhythm: Normal rate and regular rhythm.     Pulses: Normal pulses.     Heart sounds: Normal heart sounds.  Pulmonary:     Effort: Pulmonary effort is normal. No respiratory distress.     Breath sounds: Normal breath sounds. No wheezing or rales.  Abdominal:      General: Bowel sounds are normal. There is no distension.     Palpations: Abdomen is soft.     Tenderness: There is no abdominal tenderness.  Musculoskeletal:     Cervical back: Neck supple.     Right lower leg: Edema present.     Left lower leg: Edema present.     Comments: Non pitting, compression stockings on   Skin:    General: Skin is  warm.     Capillary Refill: Capillary refill takes less than 2 seconds.  Neurological:     General: No focal deficit present.     Mental Status: She is alert.     Motor: Weakness present.     Gait: Gait abnormal.  Psychiatric:        Mood and Affect: Mood normal.     Labs reviewed: Recent Labs    01/20/23 0843 01/21/23 0259 02/06/23 2351 02/08/23 0902 02/09/23 0627 02/15/23 0000 05/19/23 0000 10/10/23 0000 10/27/23 0000  NA  --    < > 137 138 136   < > 139 139 141  K  --    < > 4.1 4.0 4.1   < > 4.1 4.2 4.1  CL  --    < > 105 106 105   < > 105 104 105  CO2  --    < > 19* 23 19*   < > 22 21 25*  GLUCOSE  --    < > 151* 155* 191*  --   --   --   --   BUN  --    < > 55* 51* 49*   < > 35* 45* 49*  CREATININE  --    < > 1.56* 1.48* 1.37*   < > 1.2* 1.3* 1.4*  CALCIUM   --    < > 8.5* 8.6* 8.7*   < > 9.0 9.2 9.1  MG 2.1  --   --  2.4  --   --   --   --   --    < > = values in this interval not displayed.   Recent Labs    01/18/23 0000 01/20/23 0154 02/08/23 0902  AST 22 19 17   ALT 16 15 24   ALKPHOS 108 82 88  BILITOT  --  1.5* 0.7  PROT  --  6.8 6.1*  ALBUMIN 3.8 3.4* 2.9*   Recent Labs    01/20/23 0154 01/20/23 0526 02/06/23 2351 02/08/23 0902 02/09/23 0627  WBC 13.6*   < > 13.4* 10.9* 10.6*  NEUTROABS 11.2*  --   --  7.8*  --   HGB 15.6*   < > 14.2 14.2 13.5  HCT 48.7*   < > 46.2* 44.5 42.4  MCV 95.9   < > 98.3 94.3 94.2  PLT 128*   < > 164 158 177   < > = values in this interval not displayed.   Lab Results  Component Value Date   TSH 1.29 01/18/2023   Lab Results  Component Value Date   HGBA1C 6.3 05/17/2023    Lab Results  Component Value Date   CHOL 146 01/18/2023   HDL 56 01/18/2023   LDLCALC 65 01/18/2023   TRIG 126 01/18/2023    Significant Diagnostic Results in last 30 days:  No results found.  Assessment/Plan 1. Pressure injury of sacral region, stage 1 (Primary) - DOI 11/12 - followed by wound nurse - almost healed - cont barrier cream and frequent repositioning  2. Chronic diastolic CHF (congestive heart failure) (HCC) - compensated - lungs clear, no weight fluctuation, non pitting edema - cont furosemide   3. Type 2 diabetes mellitus with stage 3b chronic kidney disease, without long-term current use of insulin  (HCC) - no hypoglycemia - last A1c was 6.9 - not on medication   4. Essential hypertension - controlled with furosemide   5. Mixed hyperlipidemia - LDL stable - cont atorvastatin   6. Dementia without behavioral disturbance (HCC) -  MMSE 22/30 - no recent behaviors - does well with caregiver - weight stable  - ambulates with w/c - cont Aricept  and Namenda   7. Reactive depression - see above - no mood changes  - cont Wellbutrin   8. ILD (interstitial lung disease) (HCC) - not on oxygen  - cont Trelegy and albuterol   9. Chronic deep vein thrombosis (DVT) of proximal vein of left lower extremity (HCC) - diagnosed 01/2023 - cont Eliquis   10. Age-related osteoporosis without current pathological fracture - cont vitamin D     Family/ staff Communication: plan discussed with patient and nurse  Labs/tests ordered:  none

## 2023-12-20 ENCOUNTER — Encounter: Payer: Self-pay | Admitting: Orthopedic Surgery

## 2023-12-20 NOTE — Progress Notes (Signed)
 Location:  [facility] Room Number: 108/A Place of Service:  SNF [phone]) Provider:  Greig FORBES Cluster, NP   Charlanne Fredia CROME, MD  Patient Care Team: Charlanne Fredia CROME, MD as PCP - General (Internal Medicine) Lonni Slain, MD as PCP - Cardiology (Cardiology) Melodi Lerner, MD as Consulting Physician (Orthopedic Surgery) Claudene Victory ORN, MD (Inactive) as Consulting Physician (Cardiology) Cleatus Collar, MD as Consulting Physician (Ophthalmology) Jane Charleston, MD as Consulting Physician (Orthopedic Surgery)  Extended Emergency Contact Information Primary Emergency Contact: [name]  [place] Home Phone: [phone] Mobile Phone: [phone] Relation: Daughter Secondary Emergency Contact: Ringer,Arlene Home Phone: [phone] Mobile Phone: [phone] Relation: Niece  Code Status:  DNR Goals of care: Advanced Directive information    10/17/2023   12:44 PM  Advanced Directives  Does Patient Have a Medical Advance Directive? Yes  Type of Estate Agent of [place];Out of facility DNR (pink MOST or yellow form)  Does patient want to make changes to medical advance directive? No - Patient declined  Copy of Healthcare Power of Attorney in Chart? Yes - validated most recent copy scanned in chart (See row information)  Pre-existing out of facility DNR order (yellow form or pink MOST form) Yellow form placed in chart (order not valid for inpatient use);Pink MOST form placed in chart (order not valid for inpatient use)     Chief Complaint  Patient presents with   Medical Management of Chronic Issues    HPI:  Pt is a 89 y.o. female seen today for medical management of chronic diseases.    Past Medical History:  Diagnosis Date   Abscess of right leg 12/24/2020   Childhood asthma    Chronic diastolic heart failure (HCC) 12/25/2012   October 2013 echocardiogram 1. There is moderate concentric left  ventricle hypertrophy. 2. Left ventricular ejection fraction estimated by 2D at 60-65 percent. 3. There were no regional wall motion abnormalities. 4. Mild mitral annular calcification. 5. Mild mitral valve regurgitation. 6. There is mild tricuspid regurgitation. 7. Mildly elevated estimated right ventricular systolic pressure. 8. Right ventricular systolic pressure estimated at 35-40 mm Hg. 9. Mild calcification of the aortic valve. 10. Trace aortic valve regurgitation. 11. Analysis of mitral valve inflow, pulmonary vein Doppler and tissue Doppler suggests grade I diastolic dysfunction without elevated left atrial pressure.    Colon polyp    Diverticular stricture (HCC) 2009   Diverticulosis 10/30/2007   DM (diabetes mellitus), type 2 (HCC)    Edema 09/12/2013   Fall 05/18/2017   tripped over wheel of walker; superior and inferior pubic ramus fractures    Hypercholesterolemia    Hypertension    Hypertension, essential    Hypothyroidism    as a teenager   Indigestion    Insomnia    OA (osteoarthritis) of hip    right hip replacement, OA in bilateral knees   Osteopenia    Actonel stopped in 2012 after 5years therapy   Pneumonia 1939   hospitalized   Rapid resting heart rate 12/25/2012   Uncertain cause. Possible ectopic atrial tachycardia. Date entered 12/25/12    Right rotator cuff tear    Seasonal allergies    Unsteadiness on feet    Past Surgical History:  Procedure Laterality Date   CATARACT EXTRACTION W/ INTRAOCULAR LENS  IMPLANT, BILATERAL Bilateral    COLONOSCOPY  2009   Dr. Newell   DILATION AND CURETTAGE OF UTERUS     FRACTURE SURGERY     JOINT REPLACEMENT  ORIF WRIST FRACTURE Right    REDUCTION MAMMAPLASTY  1999   TOTAL HIP ARTHROPLASTY Right 03-04-08   Dr. Melodi    Allergies  Allergen Reactions   Trimethoprim     Per matrix   Amlodipine Besylate Other (See Comments)    Edema   Lisinopril Cough    Cough    Sulfamethoxazole-Trimethoprim Other (See  Comments)    Unknown reaction     Outpatient Encounter Medications as of 12/20/2023  Medication Sig   acetaminophen  (TYLENOL ) 500 MG tablet Take 500 mg by mouth every morning.   apixaban  (ELIQUIS ) 5 MG TABS tablet Take 2.5 mg by mouth 2 (two) times daily.   atorvastatin  (LIPITOR) 40 MG tablet TAKE 1 TABLET ONCE DAILY.   buPROPion  (WELLBUTRIN  XL) 150 MG 24 hr tablet Take 1 tablet (150 mg total) by mouth daily.   Cholecalciferol 1.25 MG (50000 UT) capsule Take 50,000 Units by mouth every Friday.   donepezil  (ARICEPT ) 10 MG tablet TAKE ONE TABLET BY MOUTH AT BEDTIME   famotidine  (PEPCID ) 20 MG tablet Take 20 mg by mouth every morning.   fexofenadine  (ALLEGRA ) 180 MG tablet Take 1 tablet (180 mg total) by mouth daily.   Fluticasone -Umeclidin-Vilant (TRELEGY ELLIPTA ) 200-62.5-25 MCG/ACT AEPB Inhale 1 puff into the lungs daily.   furosemide  (LASIX ) 20 MG tablet Take 1 tablet (20 mg total) by mouth daily as needed. Only if weight is more then 160lbs   furosemide  (LASIX ) 40 MG tablet Take 1 tablet (40 mg total) by mouth daily.   guaiFENesin  (MUCINEX ) 600 MG 12 hr tablet Take 1 tablet (600 mg total) by mouth 2 (two) times daily.   memantine  (NAMENDA ) 10 MG tablet TAKE 1 TABLET BY MOUTH TWICE DAILY.   metoprolol  succinate (TOPROL -XL) 50 MG 24 hr tablet Take 1 tablet (50 mg total) by mouth daily. Take with or immediately following a meal.   polyethylene glycol (MIRALAX  / GLYCOLAX ) 17 g packet Take 17 g by mouth daily.   potassium chloride  SA (KLOR-CON  M) 20 MEQ tablet Take 1 tablet (20 mEq total) by mouth every Monday, Wednesday, and Friday for 1 dose.   vitamin B-12 (CYANOCOBALAMIN ) 1000 MCG tablet Take 1 tablet (1,000 mcg total) by mouth daily.   No facility-administered encounter medications on file as of 12/20/2023.    Review of Systems  Immunization History  Administered Date(s) Administered   Fluad  Quad(high Dose 65+) 10/13/2021   Fluad  Trivalent(High Dose 65+) 10/14/2022   INFLUENZA, HIGH  DOSE SEASONAL PF 10/20/2018, 11/09/2019   Influenza,inj,Quad PF,6+ Mos 11/04/2017   Influenza-Unspecified 10/25/2013, 10/31/2014, 11/06/2015, 11/01/2016, 10/31/2020, 10/11/2023   Moderna Covid-19 Vaccine  Bivalent Booster 39yrs & up 10/24/2020   Moderna SARS-COV2 Booster Vaccination 11/27/2019   Moderna Sars-Covid-2 Vaccination 01/23/2019, 02/20/2019, 06/15/2021, 11/11/2021   Pneumococcal Conjugate-13 06/04/2014   Pneumococcal Polysaccharide-23 01/12/2000   Tdap 07/19/2017   Unspecified SARS-COV-2 Vaccination 10/14/2022, 10/11/2023   Zoster Recombinant(Shingrix) 05/21/2021, 09/16/2021   Pertinent  Health Maintenance Due  Topic Date Due   HEMOGLOBIN A1C  11/17/2023   FOOT EXAM  04/27/2024   OPHTHALMOLOGY EXAM  07/17/2024   Influenza Vaccine  Completed   Bone Density Scan  Completed      01/25/2023    3:51 PM 04/28/2023   10:48 AM 08/31/2023    9:25 AM 11/22/2023    2:28 PM 12/13/2023   11:16 AM  Fall Risk  Falls in the past year? 1 1 1 1 1   Was there an injury with Fall? 0  0  1  0  0  Fall Risk Category Calculator 1 1 2 1 1   Patient at Risk for Falls Due to History of fall(s);Impaired balance/gait History of fall(s) History of fall(s);Impaired balance/gait;Impaired mobility History of fall(s) History of fall(s);Impaired balance/gait  Fall risk Follow up Falls evaluation completed;Education provided Falls evaluation completed Falls evaluation completed;Education provided Falls evaluation completed Falls evaluation completed     Data saved with a previous flowsheet row definition   Functional Status Survey:    Vitals:   12/20/23 1504  BP: (!) 161/75  Pulse: (!) 59  Resp: 17  Temp: (!) 97 F (36.1 C)  SpO2: 97%  Weight: 161 lb 12.8 oz (73.4 kg)  Height: 5' 8 (1.727 m)   Body mass index is 24.6 kg/m. Physical Exam  Labs reviewed: Recent Labs    01/20/23 0843 01/21/23 0259 02/06/23 2351 02/08/23 0902 02/09/23 0627 02/15/23 0000 05/19/23 0000 10/10/23 0000  10/27/23 0000  NA  --    < > 137 138 136   < > 139 139 141  K  --    < > 4.1 4.0 4.1   < > 4.1 4.2 4.1  CL  --    < > 105 106 105   < > 105 104 105  CO2  --    < > 19* 23 19*   < > 22 21 25*  GLUCOSE  --    < > 151* 155* 191*  --   --   --   --   BUN  --    < > 55* 51* 49*   < > 35* 45* 49*  CREATININE  --    < > 1.56* 1.48* 1.37*   < > 1.2* 1.3* 1.4*  CALCIUM   --    < > 8.5* 8.6* 8.7*   < > 9.0 9.2 9.1  MG 2.1  --   --  2.4  --   --   --   --   --    < > = values in this interval not displayed.   Recent Labs    01/18/23 0000 01/20/23 0154 02/08/23 0902  AST 22 19 17   ALT 16 15 24   ALKPHOS 108 82 88  BILITOT  --  1.5* 0.7  PROT  --  6.8 6.1*  ALBUMIN 3.8 3.4* 2.9*   Recent Labs    01/20/23 0154 01/20/23 0526 02/06/23 2351 02/08/23 0902 02/09/23 0627  WBC 13.6*   < > 13.4* 10.9* 10.6*  NEUTROABS 11.2*  --   --  7.8*  --   HGB 15.6*   < > 14.2 14.2 13.5  HCT 48.7*   < > 46.2* 44.5 42.4  MCV 95.9   < > 98.3 94.3 94.2  PLT 128*   < > 164 158 177   < > = values in this interval not displayed.   Lab Results  Component Value Date   TSH 1.29 01/18/2023   Lab Results  Component Value Date   HGBA1C 6.3 05/17/2023   Lab Results  Component Value Date   CHOL 146 01/18/2023   HDL 56 01/18/2023   LDLCALC 65 01/18/2023   TRIG 126 01/18/2023    Significant Diagnostic Results in last 30 days:  No results found.  Assessment/Plan There are no diagnoses linked to this encounter.   Family/ staff Communication:  Labs/tests ordered:   This encounter was created in error - please disregard.

## 2024-01-13 ENCOUNTER — Encounter (HOSPITAL_BASED_OUTPATIENT_CLINIC_OR_DEPARTMENT_OTHER): Payer: Self-pay | Admitting: Family

## 2024-01-13 ENCOUNTER — Ambulatory Visit (HOSPITAL_BASED_OUTPATIENT_CLINIC_OR_DEPARTMENT_OTHER): Admitting: Family

## 2024-01-13 VITALS — BP 140/70 | HR 62 | Ht 68.0 in | Wt 163.0 lb

## 2024-01-13 DIAGNOSIS — I5032 Chronic diastolic (congestive) heart failure: Secondary | ICD-10-CM

## 2024-01-13 DIAGNOSIS — I25118 Atherosclerotic heart disease of native coronary artery with other forms of angina pectoris: Secondary | ICD-10-CM

## 2024-01-13 DIAGNOSIS — I10 Essential (primary) hypertension: Secondary | ICD-10-CM | POA: Diagnosis not present

## 2024-01-13 DIAGNOSIS — E785 Hyperlipidemia, unspecified: Secondary | ICD-10-CM | POA: Diagnosis not present

## 2024-01-13 DIAGNOSIS — E7800 Pure hypercholesterolemia, unspecified: Secondary | ICD-10-CM | POA: Diagnosis not present

## 2024-01-13 NOTE — Patient Instructions (Signed)
 Medication Instructions:   We will find out from [facility] how often you're getting extra furosemide  (lasix )  *If you need a refill on your cardiac medications before your next appointment, please call your pharmacy*    Follow-Up:  4 MONTHS WITH DR. LONNI

## 2024-01-13 NOTE — Progress Notes (Signed)
 " Cardiology Office Note:  .   Date:  01/13/2024  ID:  Kelsey Salinas, DOB 1926-07-15, MRN [id number] PCP: Charlanne Fredia CROME, MD  [facility] Providers Cardiologist:  Shelda Bruckner, MD    History of Present Illness: .   Kelsey Salinas is a 89 y.o. female with history of Alzheimer's dementia, diastolic heart failure, ILD, hyperlipidemia, arthritis, osteoporosis, CKD, HTN  Previous patient of Dr. Claudene having since established with Dr. Raford.  Admitted 1/9 - 01/24/2023 with acute hypoxic respiratory failure in the setting of pneumonia and acute diastolic heart failure exacerbation.  Admitted 1/26-1/29/25 with LLE DVT after presenting with LLE swelling.  LE Doppler DVT involving left common femoral, SFJ, femoral vein, popliteal vein.  Started on heparin  and transition to Eliquis .  Known chronically elevated troponin felt to be secondary to CKD which was asymptomatic.  [name]  was discontinued due to UTI.  Losartan  discontinued due to mild AKI.  Recommended to resume Lasix  to 3/25 in outpatient setting 40 mg Monday, Wednesday, Friday.  Due to bradycardia Toprol  was reduced from twice daily to daily.   She saw [facility] 02/17/2023 who recommended medical management and compression stockings, elevation. Recommended for lifelong OAC as occurred due to immobility.  She was last seen 10/14/23 by Dr. Bruckner. After 2-3 days on higher dose diuretic, legs stopped weeping. Weight down 5 lbs. Her LLE was noted to be chronically more swollen than LLE. She was advised to continue Lasix  40mg  daily. Labs from SNF were stable.  Presents today for follow up from [facility]. She reports feeling well with no chest pain, dyspnea. She sits in wheelchair during day. Participating in PT twice per week. Aide from [facility] notes earlier this week had weeping of LLE and required extra PRN Furosemide  with improvement in edema. Her edema is presently at her baseline. She wears compression stockings daily. Her PRN  Lasix  is listed on her medical record as for weights >160 lbs though her weight for the last month has been >160 lbs. Aide does not believe she is getting PRN Lasix  on a daily basis.   ROS: Please see the history of present illness.    All other systems reviewed and are negative.   Studies Reviewed: SABRA   EKG Interpretation Date/Time:  Friday January 13 2024 15:40:50 EST Ventricular Rate:  62 PR Interval:  184 QRS Duration:  104 QT Interval:  430 QTC Calculation: 436 R Axis:   -21  Text Interpretation: Normal sinus rhythm  No acute ST/T wave changes. Confirmed by Vannie Mora ([id number]) on 01/13/2024 3:42:45 PM    Cardiac Studies & Procedures   ______________________________________________________________________________________________     ECHOCARDIOGRAM  ECHOCARDIOGRAM COMPLETE 01/21/2023  Narrative ECHOCARDIOGRAM REPORT    Patient Name:   Kelsey Salinas Date of Exam: 01/21/2023 Medical Rec #:  [id number]       Height:       68.0 in Accession #:    [id number]      Weight:       159.4 lb Date of Birth:  [birth year]       BSA:          1.856 m Patient Age:    89 years        BP:           164/89 mmHg Patient Gender: F               HR:           70 bpm. Exam Location:  Inpatient  Procedure:  2D Echo, Cardiac Doppler and Color Doppler  Indications:    Congestive Heart Failure  History:        Patient has prior history of Echocardiogram examinations, most recent 09/10/2021. Arrythmias:PAC and PVC; Risk Factors:Hypertension, Dyslipidemia and Former Smoker.  Sonographer:    Ozell Free Referring Phys: [id number] TIFFANY Kathryn  IMPRESSIONS   1. Left ventricular ejection fraction, by estimation, is 60 to 65%. The left ventricle has normal function. The left ventricle has no regional wall motion abnormalities. There is mild concentric left ventricular hypertrophy. Left ventricular diastolic parameters are consistent with Grade I diastolic dysfunction (impaired relaxation). 2.  Peak RV-RA gradient 67 mmHg. Right ventricular systolic function is normal. The right ventricular size is normal. 3. Left atrial size was severely dilated. 4. The mitral valve is normal in structure. Trivial mitral valve regurgitation. No evidence of mitral stenosis. 5. The aortic valve is tricuspid. Aortic valve regurgitation is trivial. No aortic stenosis is present. 6. The IVC was not well-visualized.  FINDINGS Left Ventricle: Left ventricular ejection fraction, by estimation, is 60 to 65%. The left ventricle has normal function. The left ventricle has no regional wall motion abnormalities. The left ventricular internal cavity size was normal in size. There is mild concentric left ventricular hypertrophy. Left ventricular diastolic parameters are consistent with Grade I diastolic dysfunction (impaired relaxation).  Right Ventricle: Peak RV-RA gradient 67 mmHg. The right ventricular size is normal. No increase in right ventricular wall thickness. Right ventricular systolic function is normal.  Left Atrium: Left atrial size was severely dilated.  Right Atrium: Right atrial size was normal in size.  Pericardium: There is no evidence of pericardial effusion.  Mitral Valve: The mitral valve is normal in structure. Trivial mitral valve regurgitation. No evidence of mitral valve stenosis.  Tricuspid Valve: The tricuspid valve is normal in structure. Tricuspid valve regurgitation is mild.  Aortic Valve: The aortic valve is tricuspid. Aortic valve regurgitation is trivial. Aortic regurgitation PHT measures 449 msec. No aortic stenosis is present. Aortic valve mean gradient measures 3.0 mmHg. Aortic valve peak gradient measures 6.1 mmHg. Aortic valve area, by VTI measures 2.60 cm.  Pulmonic Valve: The pulmonic valve was normal in structure. Pulmonic valve regurgitation is trivial.  Aorta: The aortic root is normal in size and structure.  Venous: The IVC was not well-visualized. The inferior  vena cava was not well visualized.  IAS/Shunts: No atrial level shunt detected by color flow Doppler.   LEFT VENTRICLE PLAX 2D LVIDd:         4.40 cm   Diastology LVIDs:         2.60 cm   LV e' medial:    2.94 cm/s LV PW:         1.00 cm   LV E/e' medial:  21.2 LV IVS:        1.30 cm   LV e' lateral:   6.09 cm/s LVOT diam:     2.10 cm   LV E/e' lateral: 10.2 LV SV:         51 LV SV Index:   27 LVOT Area:     3.46 cm   RIGHT VENTRICLE RV Basal diam:  3.70 cm RV S prime:     14.00 cm/s TAPSE (M-mode): 2.3 cm  LEFT ATRIUM             Index        RIGHT ATRIUM           Index LA diam:  4.70 cm 2.53 cm/m   RA Area:     16.90 cm LA Vol (A2C):   93.1 ml 50.17 ml/m  RA Volume:   42.80 ml  23.06 ml/m LA Vol (A4C):   97.5 ml 52.54 ml/m LA Biplane Vol: 96.2 ml 51.84 ml/m AORTIC VALVE AV Area (Vmax):    2.61 cm AV Area (Vmean):   2.75 cm AV Area (VTI):     2.60 cm AV Vmax:           123.00 cm/s AV Vmean:          83.800 cm/s AV VTI:            0.196 m AV Peak Grad:      6.1 mmHg AV Mean Grad:      3.0 mmHg LVOT Vmax:         92.80 cm/s LVOT Vmean:        66.500 cm/s LVOT VTI:          0.147 m LVOT/AV VTI ratio: 0.75 AI PHT:            449 msec  AORTA Ao Root diam: 3.70 cm Ao Asc diam:  3.00 cm  MITRAL VALVE               TRICUSPID VALVE MV Area (PHT): 4.19 cm    TR Peak grad:   66.9 mmHg MV Decel Time: 181 msec    TR Vmax:        409.00 cm/s MV E velocity: 62.20 cm/s MV A velocity: 81.20 cm/s  SHUNTS MV E/A ratio:  0.77        Systemic VTI:  0.15 m Systemic Diam: 2.10 cm  Dalton McleanMD Electronically signed by Ezra Kanner Signature Date/Time: 01/21/2023/9:44:13 AM    Final          ______________________________________________________________________________________________      Risk Assessment/Calculations:            Physical Exam:   VS:  BP (!) 140/70   Pulse (!) 58   Ht 5' 8 (1.727 m)   Wt 163 lb (73.9 kg)   SpO2 96%   BMI  24.78 kg/m    Wt Readings from Last 3 Encounters:  01/13/24 163 lb (73.9 kg)  12/20/23 161 lb 12.8 oz (73.4 kg)  12/13/23 161 lb 3.2 oz (73.1 kg)    GEN: Well nourished, well developed in no acute distress NECK: No JVD; No carotid bruits CARDIAC: RRR, no murmurs, rubs, gallops RESPIRATORY:  Clear to auscultation without rales, wheezing or rhonchi  ABDOMEN: Soft, non-tender, non-distended EXTREMITIES:  LLE with 1+ edema, RLE with nonpitting edema; bilateral LE in compression stockings; No deformity   ASSESSMENT AND PLAN: .    Chronic diastolic heart failure - Euvolemic and well compensated on exam. GDMT Toprol  50mg  daily, Lasix  40mg  daily. Per med list extra Lasix  20mg  daily if weight >160 lbs. [name]  previously stopped due to UTI.  Low sodium diet, fluid restriction <2L encouraged.  Leg elevation encouraged, sits in wheelchair during the day.  Continue daily weights at [employer]. Would advise contacting provider (cardiology or primary team at [facility]) for weight gain of 3 lbs overnight or 5 lbs in one week.  Prior baseline weight 160 lbs with Rx for PRN additional Lasix  20mg  if weight >160 lbs. Her weight has been > 160 lbs since 12/13/23. Unclear how often she is receiving additional Lasix . Will request med log from SNF.  As stable LE edema on exam and no  dyspnea, if requiring PRN Lasix  infrequently will adjust weight parameters to take Lasix  40mg  daily with additional 20mg  PRN for weight >162 lbs or greater. If requiring frequent PRN Lasix , consider increasing daily dose vs addition Spironolactone. Either will require repeat BMP 1-2 weeks after change.  LEE DVT 01/2023 / Hypercoagulable state - Recommended for indefinite DAPT per [facility] given immobility. Continue Eliquis  2.5mg  BID (does not meet dose reduction criteria). Denies bleeding complications. Managed by [facility] care team at [facility].   HTN - BP goal <140/80 given age. Reasonably controlled. Continue Lasix  40mg  daily, Toprol   50mg  daily.   CKD 3A- Careful titration of diuretic and antihypertensive.  10/27/23 creatinine 1.4, GFR 33, K 4.1. Similar to previous.   Coronary calcification on CT / HLD- Stable with no anginal symptoms. No indication for ischemic evaluation.  GDMT Atorvastatin  40mg  daily, Toprol  50mg  daily. No ASA due to OAC. 01/18/23 LDL 65.  Paroxysmal atrial tachycardia- No palpitations. Heart rate controlled with Toprol  50mg  daily. Had bradycardia on higher doses.        Dispo: follow up in 4 months  Signed, Reche GORMAN Finder, NP   "

## 2024-01-20 ENCOUNTER — Telehealth (HOSPITAL_BASED_OUTPATIENT_CLINIC_OR_DEPARTMENT_OTHER): Payer: Self-pay | Admitting: Family

## 2024-01-20 NOTE — Telephone Encounter (Signed)
 While scheduling follow up appt, the nurse on site advised they had sent the Lasix  report to our fax

## 2024-01-23 ENCOUNTER — Non-Acute Institutional Stay (SKILLED_NURSING_FACILITY): Payer: Self-pay | Admitting: Internal Medicine

## 2024-01-23 ENCOUNTER — Encounter: Payer: Self-pay | Admitting: Internal Medicine

## 2024-01-23 DIAGNOSIS — F0390 Unspecified dementia without behavioral disturbance: Secondary | ICD-10-CM | POA: Diagnosis not present

## 2024-01-23 DIAGNOSIS — I825Y2 Chronic embolism and thrombosis of unspecified deep veins of left proximal lower extremity: Secondary | ICD-10-CM

## 2024-01-23 DIAGNOSIS — E1122 Type 2 diabetes mellitus with diabetic chronic kidney disease: Secondary | ICD-10-CM | POA: Diagnosis not present

## 2024-01-23 DIAGNOSIS — J849 Interstitial pulmonary disease, unspecified: Secondary | ICD-10-CM

## 2024-01-23 DIAGNOSIS — N1832 Chronic kidney disease, stage 3b: Secondary | ICD-10-CM

## 2024-01-23 DIAGNOSIS — I5032 Chronic diastolic (congestive) heart failure: Secondary | ICD-10-CM | POA: Diagnosis not present

## 2024-01-23 DIAGNOSIS — M542 Cervicalgia: Secondary | ICD-10-CM | POA: Diagnosis not present

## 2024-01-23 DIAGNOSIS — I10 Essential (primary) hypertension: Secondary | ICD-10-CM

## 2024-01-23 DIAGNOSIS — E782 Mixed hyperlipidemia: Secondary | ICD-10-CM | POA: Diagnosis not present

## 2024-01-23 DIAGNOSIS — F329 Major depressive disorder, single episode, unspecified: Secondary | ICD-10-CM

## 2024-01-23 MED ORDER — FUROSEMIDE 40 MG PO TABS: 60.0000 mg | ORAL_TABLET | Freq: Every day | ORAL | Status: AC

## 2024-01-23 NOTE — Progress Notes (Signed)
 " Location[facility] Room Number: [place] 1 108-P Place of Service:  SNF [id number]) Provider:  Charlanne Fredia CROME, MD  Patient Care Team: Charlanne Fredia CROME, MD as PCP - General (Internal Medicine) Lonni Slain, MD as PCP - Cardiology (Cardiology) Melodi Lerner, MD as Consulting Physician (Orthopedic Surgery) Claudene Victory ORN, MD (Inactive) as Consulting Physician (Cardiology) Cleatus Collar, MD as Consulting Physician (Ophthalmology) Jane Charleston, MD as Consulting Physician (Orthopedic Surgery)  Extended Emergency Contact Information Primary Emergency Contact: Stoffer,Donna  [place] Home Phone: [phone] Mobile Phone: [phone] Relation: Daughter Secondary Emergency Contact: Hazzard,Arlene Home Phone: [phone] Mobile Phone: [phone] Relation: Niece  Code Status:  DNR Goals of care: Advanced Directive information    10/17/2023   12:44 PM  Advanced Directives  Does Patient Have a Medical Advance Directive? Yes  Type of Estate Agent of [place];Out of facility DNR (pink MOST or yellow form)  Does patient want to make changes to medical advance directive? No - Patient declined  Copy of Healthcare Power of Attorney in Chart? Yes - validated most recent copy scanned in chart (See row information)  Pre-existing out of facility DNR order (yellow form or pink MOST form) Yellow form placed in chart (order not valid for inpatient use);Pink MOST form placed in chart (order not valid for inpatient use)     Chief Complaint  Patient presents with   medication management of chronic issues    Routine visit    HPI:  Pt is a 89 y.o. female seen today for medical management of chronic diseases.   Seen for Regular visit  Patient is  in SNF    H/o Acute DVT of left Leg Now on Chronic Eliquis  maintenance dose     Alzheimer dementia MMSE 22/30 Patient is now in SNF as she needs help with  transfers 2 assist Also has 24/7 Caregivers On Aricept  and Namenda  Doing well per Nurses   ILD Mild No Meds/no oxygen  either   Depression doing well on Wellbutrin     Chronic Congestive heart Failure Per nurses she has been getting extra Lasix  almost every day to keep her weight around 160.  Patient has been able to tolerate this Lasix .  She does have mild edema.  But denies any shortness of breath or cough.  Neck Pain Patient has a chronic neck pain but her her caregiver she has been complaining of more pain in her neck and unable to move her neck to one side   She has been using a heating pad.  She had a MRI done many years ago which showed severe stenosis and diffuse disc disease No recent fall or injury  Wt Readings from Last 3 Encounters:  01/23/24 161 lb 12.8 oz (73.4 kg)  01/13/24 163 lb (73.9 kg)  12/20/23 161 lb 12.8 oz (73.4 kg)    Past Medical History:  Diagnosis Date   Abscess of right leg 12/24/2020   Childhood asthma    Chronic diastolic heart failure (HCC) 12/25/2012   October 2013 echocardiogram 1. There is moderate concentric left ventricle hypertrophy. 2. Left ventricular ejection fraction estimated by 2D at 60-65 percent. 3. There were no regional wall motion abnormalities. 4. Mild mitral annular calcification. 5. Mild mitral valve regurgitation. 6. There is mild tricuspid regurgitation. 7. Mildly elevated estimated right ventricular systolic pressure. 8. Right ventricular systolic pressure estimated at 35-40 mm Hg. 9. Mild calcification of the aortic valve. 10. Trace aortic valve regurgitation. 11. Analysis of mitral  valve inflow, pulmonary vein Doppler and tissue Doppler suggests grade I diastolic dysfunction without elevated left atrial pressure.    Colon polyp    Diverticular stricture (HCC) 2009   Diverticulosis 10/30/2007   DM (diabetes mellitus), type 2 (HCC)    Edema 09/12/2013   Fall 05/18/2017   tripped over wheel of walker; superior and inferior pubic  ramus fractures    Hypercholesterolemia    Hypertension    Hypertension, essential    Hypothyroidism    as a teenager   Indigestion    Insomnia    OA (osteoarthritis) of hip    right hip replacement, OA in bilateral knees   Osteopenia    Actonel stopped in 2012 after 5years therapy   Pneumonia 1939   hospitalized   Rapid resting heart rate 12/25/2012   Uncertain cause. Possible ectopic atrial tachycardia. Date entered 12/25/12    Right rotator cuff tear    Seasonal allergies    Unsteadiness on feet    Past Surgical History:  Procedure Laterality Date   CATARACT EXTRACTION W/ INTRAOCULAR LENS  IMPLANT, BILATERAL Bilateral    COLONOSCOPY  2009   Dr. Newell   DILATION AND CURETTAGE OF UTERUS     FRACTURE SURGERY     JOINT REPLACEMENT     ORIF WRIST FRACTURE Right    REDUCTION MAMMAPLASTY  1999   TOTAL HIP ARTHROPLASTY Right 03-04-08   Dr. Melodi    Allergies[1]  Outpatient Encounter Medications as of 01/23/2024  Medication Sig   acetaminophen  (TYLENOL ) 500 MG tablet Take 500 mg by mouth every morning.   atorvastatin  (LIPITOR) 40 MG tablet TAKE 1 TABLET ONCE DAILY.   buPROPion  (WELLBUTRIN  XL) 150 MG 24 hr tablet Take 1 tablet (150 mg total) by mouth daily.   Cholecalciferol 1.25 MG (50000 UT) capsule Take 50,000 Units by mouth every Friday.   donepezil  (ARICEPT ) 10 MG tablet TAKE ONE TABLET BY MOUTH AT BEDTIME   ELIQUIS  2.5 MG TABS tablet Take 2.5 mg by mouth 2 (two) times daily.   famotidine  (PEPCID ) 20 MG tablet Take 20 mg by mouth every morning.   fexofenadine  (ALLEGRA ) 180 MG tablet Take 1 tablet (180 mg total) by mouth daily.   Fluticasone -Umeclidin-Vilant (TRELEGY ELLIPTA ) 200-62.5-25 MCG/ACT AEPB Inhale 1 puff into the lungs daily.   furosemide  (LASIX ) 20 MG tablet Take 1 tablet (20 mg total) by mouth daily as needed. Only if weight is more then 160lbs   furosemide  (LASIX ) 40 MG tablet Take 1 tablet (40 mg total) by mouth daily.   guaiFENesin  (MUCINEX ) 600 MG 12  hr tablet Take 1 tablet (600 mg total) by mouth 2 (two) times daily.   memantine  (NAMENDA ) 10 MG tablet TAKE 1 TABLET BY MOUTH TWICE DAILY.   metoprolol  succinate (TOPROL -XL) 50 MG 24 hr tablet Take 1 tablet (50 mg total) by mouth daily. Take with or immediately following a meal.   polyethylene glycol (MIRALAX  / GLYCOLAX ) 17 g packet Take 17 g by mouth daily.   potassium chloride  SA (KLOR-CON  M) 20 MEQ tablet Take 1 tablet (20 mEq total) by mouth every Monday, Wednesday, and Friday for 1 dose.   vitamin B-12 (CYANOCOBALAMIN ) 1000 MCG tablet Take 1 tablet (1,000 mcg total) by mouth daily.   No facility-administered encounter medications on file as of 01/23/2024.    Review of Systems  Constitutional:  Negative for activity change and appetite change.  HENT: Negative.    Respiratory:  Negative for cough and shortness of breath.   Cardiovascular:  Positive for leg swelling.  Gastrointestinal:  Negative for constipation.  Genitourinary: Negative.   Musculoskeletal:  Positive for gait problem and neck pain. Negative for arthralgias and myalgias.  Skin: Negative.   Neurological:  Negative for dizziness and weakness.  Psychiatric/Behavioral:  Positive for confusion. Negative for dysphoric mood and sleep disturbance.     Immunization History  Administered Date(s) Administered   Fluad  Quad(high Dose 65+) 10/13/2021   Fluad  Trivalent(High Dose 65+) 10/14/2022   INFLUENZA, HIGH DOSE SEASONAL PF 10/20/2018, 11/09/2019   Influenza,inj,Quad PF,6+ Mos 11/04/2017   Influenza-Unspecified 10/25/2013, 10/31/2014, 11/06/2015, 11/01/2016, 10/31/2020, 10/11/2023   Moderna Covid-19 Vaccine  Bivalent Booster 58yrs & up 10/24/2020   Moderna SARS-COV2 Booster Vaccination 11/27/2019   Moderna Sars-Covid-2 Vaccination 01/23/2019, 02/20/2019, 06/15/2021, 11/11/2021   Pneumococcal Conjugate-13 06/04/2014   Pneumococcal Polysaccharide-23 01/12/2000   Tdap 07/19/2017   Unspecified SARS-COV-2 Vaccination 10/14/2022,  10/11/2023   Zoster Recombinant(Shingrix) 05/21/2021, 09/16/2021   Pertinent  Health Maintenance Due  Topic Date Due   FOOT EXAM  04/27/2024   HEMOGLOBIN A1C  05/21/2024   OPHTHALMOLOGY EXAM  07/17/2024   Influenza Vaccine  Completed   Bone Density Scan  Completed      01/25/2023    3:51 PM 04/28/2023   10:48 AM 08/31/2023    9:25 AM 11/22/2023    2:28 PM 12/13/2023   11:16 AM  Fall Risk  Falls in the past year? 1 1 1 1 1   Was there an injury with Fall? 0  0  1  0  0  Fall Risk Category Calculator 1 1 2 1 1   Patient at Risk for Falls Due to History of fall(s);Impaired balance/gait History of fall(s) History of fall(s);Impaired balance/gait;Impaired mobility History of fall(s) History of fall(s);Impaired balance/gait  Fall risk Follow up Falls evaluation completed;Education provided Falls evaluation completed Falls evaluation completed;Education provided Falls evaluation completed Falls evaluation completed     Data saved with a previous flowsheet row definition   Functional Status Survey:    Vitals:   01/23/24 1530 01/23/24 1531  BP: (!) 173/80 (!) 164/87  Pulse: 67   SpO2: 93%   Weight: 161 lb 12.8 oz (73.4 kg)   Height: 5' 8 (1.727 m)    Body mass index is 24.6 kg/m. Physical Exam Vitals reviewed.  Constitutional:      Appearance: Normal appearance.  HENT:     Head: Normocephalic.     Nose: Nose normal.     Mouth/Throat:     Mouth: Mucous membranes are moist.     Pharynx: Oropharynx is clear.  Eyes:     Pupils: Pupils are equal, round, and reactive to light.  Cardiovascular:     Rate and Rhythm: Normal rate and regular rhythm.     Pulses: Normal pulses.     Heart sounds: Normal heart sounds. No murmur heard. Pulmonary:     Effort: Pulmonary effort is normal.     Breath sounds: Normal breath sounds.  Abdominal:     General: Abdomen is flat. Bowel sounds are normal.     Palpations: Abdomen is soft.  Musculoskeletal:        General: Swelling present.      Cervical back: Neck supple.  Skin:    General: Skin is warm.     Comments: Redness around the neck area where she is using heating pad  Neurological:     General: No focal deficit present.     Mental Status: She is alert.  Psychiatric:  Mood and Affect: Mood normal.        Thought Content: Thought content normal.     Labs reviewed: Recent Labs    02/06/23 2351 02/08/23 0902 02/09/23 0627 02/15/23 0000 05/19/23 0000 10/10/23 0000 10/27/23 0000 11/22/23 0000  NA 137 138 136   < > 139 139 141  --   K 4.1 4.0 4.1   < > 4.1 4.2 4.1 4.0  CL 105 106 105   < > 105 104 105  --   CO2 19* 23 19*   < > 22 21 25*  --   GLUCOSE 151* 155* 191*  --   --   --   --   --   BUN 55* 51* 49*   < > 35* 45* 49* 35*  CREATININE 1.56* 1.48* 1.37*   < > 1.2* 1.3* 1.4* 1.3*  CALCIUM  8.5* 8.6* 8.7*   < > 9.0 9.2 9.1 9.2  MG  --  2.4  --   --   --   --   --   --    < > = values in this interval not displayed.   Recent Labs    02/08/23 0902  AST 17  ALT 24  ALKPHOS 88  BILITOT 0.7  PROT 6.1*  ALBUMIN 2.9*   Recent Labs    02/06/23 2351 02/08/23 0902 02/09/23 0627 11/22/23 0000  WBC 13.4* 10.9* 10.6* 8.2  NEUTROABS  --  7.8*  --   --   HGB 14.2 14.2 13.5 12.3  HCT 46.2* 44.5 42.4 37  MCV 98.3 94.3 94.2  --   PLT 164 158 177  --    Lab Results  Component Value Date   TSH 1.05 11/22/2023   Lab Results  Component Value Date   HGBA1C 6.9 11/22/2023   Lab Results  Component Value Date   CHOL 145 11/22/2023   HDL 59 11/22/2023   LDLCALC 62 11/22/2023   TRIG 123 11/22/2023    Significant Diagnostic Results in last 30 days:  No results found.  Assessment/Plan 1. Chronic diastolic CHF (congestive heart failure) (HCC) (Primary) Since she has been using extra Lasix  everyday to help with her LE edema.  Will change her Lasix  to 60 mg every day And repeat BMP in 1 week  2. Neck pain Recent worsening.  Can be due to her posture as she sits in her wheelchair. Will try  prednisone  20 mg p.o. daily for 5 days to see if it helps her pain.  Cannot do NSAIDs due to her being on Eliquis   3. Essential hypertension Blood pressure is elevated for last few weeks We will start her on hydralazine  10 mg p.o. q. twice daily  4. Type 2 diabetes mellitus with stage 3b chronic kidney disease, without long-term current use of insulin  (HCC) A1C good in 11/2023 No Meds  5. Mixed hyperlipidemia On statin  6. Dementia without behavioral disturbance (HCC) On Aricept  and Namenda  Doing well in SNF  7. ILD (interstitial lung disease) (HCC) Follows with Pulmonary On Trelegy  8. Chronic deep vein thrombosis (DVT) of proximal vein of left lower extremity (HCC) On Chronic Eliquis   9. Reactive depression Doing well on Wellbutrin     Family/ staff Communication:   Labs/tests ordered:  BMP in 1 week        [1]  Allergies Allergen Reactions   Trimethoprim     Per matrix   Amlodipine Besylate Other (See Comments)    Edema   Lisinopril Cough  Cough    Sulfamethoxazole-Trimethoprim Other (See Comments)    Unknown reaction    "

## 2024-01-25 NOTE — Telephone Encounter (Signed)
 Lasix  has been adjusted to 60mg  daily per Dr. Charlanne.   Kelsey Mcmonigle S Ace Bergfeld, NP

## 2024-05-14 ENCOUNTER — Ambulatory Visit (HOSPITAL_BASED_OUTPATIENT_CLINIC_OR_DEPARTMENT_OTHER): Admitting: Cardiology
